# Patient Record
Sex: Female | Born: 1960 | Race: White | Hispanic: No | Marital: Single | Smoking: Former smoker
Health system: Southern US, Community
[De-identification: ages and names within clinical notes are randomized; demographics above are authoritative.]

## PROBLEM LIST (undated history)

## (undated) ENCOUNTER — Emergency Department (HOSPITAL_COMMUNITY): Admission: EM | Payer: Medicare Other | Source: Home / Self Care

## (undated) DIAGNOSIS — G709 Myoneural disorder, unspecified: Secondary | ICD-10-CM

## (undated) DIAGNOSIS — R7303 Prediabetes: Secondary | ICD-10-CM

## (undated) DIAGNOSIS — R011 Cardiac murmur, unspecified: Secondary | ICD-10-CM

## (undated) DIAGNOSIS — F319 Bipolar disorder, unspecified: Secondary | ICD-10-CM

## (undated) DIAGNOSIS — I341 Nonrheumatic mitral (valve) prolapse: Secondary | ICD-10-CM

## (undated) DIAGNOSIS — H269 Unspecified cataract: Secondary | ICD-10-CM

## (undated) DIAGNOSIS — F419 Anxiety disorder, unspecified: Secondary | ICD-10-CM

## (undated) DIAGNOSIS — T7840XA Allergy, unspecified, initial encounter: Secondary | ICD-10-CM

## (undated) DIAGNOSIS — K219 Gastro-esophageal reflux disease without esophagitis: Secondary | ICD-10-CM

## (undated) DIAGNOSIS — C50919 Malignant neoplasm of unspecified site of unspecified female breast: Secondary | ICD-10-CM

## (undated) DIAGNOSIS — F32A Depression, unspecified: Secondary | ICD-10-CM

## (undated) DIAGNOSIS — M1990 Unspecified osteoarthritis, unspecified site: Secondary | ICD-10-CM

## (undated) DIAGNOSIS — J45909 Unspecified asthma, uncomplicated: Secondary | ICD-10-CM

## (undated) DIAGNOSIS — E785 Hyperlipidemia, unspecified: Secondary | ICD-10-CM

## (undated) DIAGNOSIS — M797 Fibromyalgia: Secondary | ICD-10-CM

## (undated) DIAGNOSIS — I499 Cardiac arrhythmia, unspecified: Secondary | ICD-10-CM

## (undated) DIAGNOSIS — S62609A Fracture of unspecified phalanx of unspecified finger, initial encounter for closed fracture: Secondary | ICD-10-CM

## (undated) DIAGNOSIS — F329 Major depressive disorder, single episode, unspecified: Secondary | ICD-10-CM

## (undated) DIAGNOSIS — J449 Chronic obstructive pulmonary disease, unspecified: Secondary | ICD-10-CM

## (undated) DIAGNOSIS — Z9289 Personal history of other medical treatment: Secondary | ICD-10-CM

## (undated) DIAGNOSIS — H8109 Meniere's disease, unspecified ear: Secondary | ICD-10-CM

## (undated) HISTORY — DX: Allergy, unspecified, initial encounter: T78.40XA

## (undated) HISTORY — DX: Fracture of unspecified phalanx of unspecified finger, initial encounter for closed fracture: S62.609A

## (undated) HISTORY — PX: ELBOW SURGERY: SHX618

## (undated) HISTORY — DX: Chronic obstructive pulmonary disease, unspecified: J44.9

## (undated) HISTORY — PX: COLONOSCOPY: SHX174

## (undated) HISTORY — DX: Myoneural disorder, unspecified: G70.9

## (undated) HISTORY — DX: Bipolar disorder, unspecified: F31.9

## (undated) HISTORY — DX: Unspecified osteoarthritis, unspecified site: M19.90

## (undated) HISTORY — DX: Unspecified cataract: H26.9

## (undated) HISTORY — PX: POLYPECTOMY: SHX149

## (undated) HISTORY — PX: ECTOPIC PREGNANCY SURGERY: SHX613

## (undated) HISTORY — PX: EYE SURGERY: SHX253

## (undated) HISTORY — DX: Hyperlipidemia, unspecified: E78.5

## (undated) HISTORY — PX: WRIST SURGERY: SHX841

## (undated) HISTORY — PX: OTHER SURGICAL HISTORY: SHX169

## (undated) HISTORY — PX: TONSILLECTOMY: SUR1361

## (undated) HISTORY — DX: Malignant neoplasm of unspecified site of unspecified female breast: C50.919

## (undated) HISTORY — PX: ANKLE SURGERY: SHX546

## (undated) HISTORY — PX: TMJ ARTHROPLASTY: SHX1066

## (undated) HISTORY — DX: Cardiac murmur, unspecified: R01.1

## (undated) HISTORY — PX: UTERINE FIBROID SURGERY: SHX826

## (undated) HISTORY — DX: Anxiety disorder, unspecified: F41.9

## (undated) HISTORY — DX: Personal history of other medical treatment: Z92.89

## (undated) HISTORY — PX: KNEE SURGERY: SHX244

## (undated) HISTORY — DX: Nonrheumatic mitral (valve) prolapse: I34.1

---

## 1997-07-30 ENCOUNTER — Other Ambulatory Visit: Admission: RE | Admit: 1997-07-30 | Discharge: 1997-07-30 | Payer: Self-pay | Admitting: Obstetrics and Gynecology

## 1997-08-27 ENCOUNTER — Ambulatory Visit (HOSPITAL_COMMUNITY): Admission: RE | Admit: 1997-08-27 | Discharge: 1997-08-27 | Payer: Self-pay | Admitting: Obstetrics and Gynecology

## 1998-03-18 ENCOUNTER — Emergency Department (HOSPITAL_COMMUNITY): Admission: EM | Admit: 1998-03-18 | Discharge: 1998-03-18 | Payer: Self-pay | Admitting: Emergency Medicine

## 1998-08-22 ENCOUNTER — Encounter: Payer: Self-pay | Admitting: Obstetrics and Gynecology

## 1998-08-22 ENCOUNTER — Ambulatory Visit (HOSPITAL_COMMUNITY): Admission: RE | Admit: 1998-08-22 | Discharge: 1998-08-22 | Payer: Self-pay | Admitting: Internal Medicine

## 1999-03-15 ENCOUNTER — Inpatient Hospital Stay (HOSPITAL_COMMUNITY): Admission: EM | Admit: 1999-03-15 | Discharge: 1999-03-17 | Payer: Self-pay

## 1999-03-15 ENCOUNTER — Encounter: Payer: Self-pay | Admitting: *Deleted

## 1999-07-28 ENCOUNTER — Other Ambulatory Visit: Admission: RE | Admit: 1999-07-28 | Discharge: 1999-07-28 | Payer: Self-pay | Admitting: Obstetrics and Gynecology

## 1999-09-16 ENCOUNTER — Ambulatory Visit (HOSPITAL_BASED_OUTPATIENT_CLINIC_OR_DEPARTMENT_OTHER): Admission: RE | Admit: 1999-09-16 | Discharge: 1999-09-17 | Payer: Self-pay | Admitting: Orthopedic Surgery

## 2000-03-10 ENCOUNTER — Ambulatory Visit (HOSPITAL_BASED_OUTPATIENT_CLINIC_OR_DEPARTMENT_OTHER): Admission: RE | Admit: 2000-03-10 | Discharge: 2000-03-10 | Payer: Self-pay | Admitting: Orthopedic Surgery

## 2000-08-04 ENCOUNTER — Ambulatory Visit (HOSPITAL_BASED_OUTPATIENT_CLINIC_OR_DEPARTMENT_OTHER): Admission: RE | Admit: 2000-08-04 | Discharge: 2000-08-05 | Payer: Self-pay | Admitting: Orthopedic Surgery

## 2000-08-15 ENCOUNTER — Ambulatory Visit (HOSPITAL_COMMUNITY): Admission: RE | Admit: 2000-08-15 | Discharge: 2000-08-15 | Payer: Self-pay | Admitting: Obstetrics and Gynecology

## 2000-08-15 ENCOUNTER — Encounter: Payer: Self-pay | Admitting: Obstetrics and Gynecology

## 2000-12-12 ENCOUNTER — Encounter: Payer: Self-pay | Admitting: Internal Medicine

## 2000-12-12 ENCOUNTER — Encounter: Admission: RE | Admit: 2000-12-12 | Discharge: 2000-12-12 | Payer: Self-pay | Admitting: Internal Medicine

## 2001-08-08 ENCOUNTER — Encounter: Admission: RE | Admit: 2001-08-08 | Discharge: 2001-08-08 | Payer: Self-pay | Admitting: Obstetrics and Gynecology

## 2001-08-08 ENCOUNTER — Encounter: Payer: Self-pay | Admitting: Obstetrics and Gynecology

## 2003-08-19 ENCOUNTER — Other Ambulatory Visit: Admission: RE | Admit: 2003-08-19 | Discharge: 2003-08-19 | Payer: Self-pay | Admitting: Obstetrics and Gynecology

## 2003-09-06 ENCOUNTER — Ambulatory Visit (HOSPITAL_COMMUNITY): Admission: RE | Admit: 2003-09-06 | Discharge: 2003-09-06 | Payer: Self-pay | Admitting: Obstetrics and Gynecology

## 2003-09-12 ENCOUNTER — Ambulatory Visit (HOSPITAL_COMMUNITY): Admission: RE | Admit: 2003-09-12 | Discharge: 2003-09-12 | Payer: Self-pay | Admitting: Obstetrics and Gynecology

## 2004-04-11 ENCOUNTER — Emergency Department (HOSPITAL_COMMUNITY): Admission: EM | Admit: 2004-04-11 | Discharge: 2004-04-11 | Payer: Self-pay | Admitting: Emergency Medicine

## 2004-04-21 ENCOUNTER — Ambulatory Visit: Payer: Self-pay | Admitting: Internal Medicine

## 2004-04-24 ENCOUNTER — Emergency Department (HOSPITAL_COMMUNITY): Admission: EM | Admit: 2004-04-24 | Discharge: 2004-04-24 | Payer: Self-pay | Admitting: Emergency Medicine

## 2004-04-27 ENCOUNTER — Ambulatory Visit: Payer: Self-pay | Admitting: *Deleted

## 2004-04-29 ENCOUNTER — Ambulatory Visit: Payer: Self-pay | Admitting: Family Medicine

## 2004-04-29 ENCOUNTER — Ambulatory Visit (HOSPITAL_COMMUNITY): Admission: RE | Admit: 2004-04-29 | Discharge: 2004-04-29 | Payer: Self-pay | Admitting: Family Medicine

## 2004-06-19 ENCOUNTER — Ambulatory Visit: Payer: Self-pay | Admitting: Internal Medicine

## 2004-06-21 ENCOUNTER — Emergency Department (HOSPITAL_COMMUNITY): Admission: EM | Admit: 2004-06-21 | Discharge: 2004-06-21 | Payer: Self-pay | Admitting: Emergency Medicine

## 2004-07-01 ENCOUNTER — Ambulatory Visit (HOSPITAL_COMMUNITY): Admission: RE | Admit: 2004-07-01 | Discharge: 2004-07-01 | Payer: Self-pay | Admitting: Internal Medicine

## 2004-07-01 ENCOUNTER — Ambulatory Visit: Payer: Self-pay | Admitting: Internal Medicine

## 2004-09-19 ENCOUNTER — Emergency Department (HOSPITAL_COMMUNITY): Admission: EM | Admit: 2004-09-19 | Discharge: 2004-09-20 | Payer: Self-pay | Admitting: Emergency Medicine

## 2004-12-31 ENCOUNTER — Ambulatory Visit: Payer: Self-pay | Admitting: Internal Medicine

## 2005-03-01 ENCOUNTER — Ambulatory Visit (HOSPITAL_COMMUNITY): Admission: RE | Admit: 2005-03-01 | Discharge: 2005-03-01 | Payer: Self-pay | Admitting: Internal Medicine

## 2005-03-08 ENCOUNTER — Ambulatory Visit: Payer: Self-pay | Admitting: Internal Medicine

## 2005-03-08 ENCOUNTER — Encounter: Payer: Self-pay | Admitting: Internal Medicine

## 2005-04-23 ENCOUNTER — Ambulatory Visit: Payer: Self-pay | Admitting: Internal Medicine

## 2005-06-15 ENCOUNTER — Ambulatory Visit: Payer: Self-pay | Admitting: Internal Medicine

## 2005-06-29 ENCOUNTER — Ambulatory Visit: Payer: Self-pay | Admitting: Internal Medicine

## 2005-07-13 ENCOUNTER — Ambulatory Visit: Payer: Self-pay | Admitting: Internal Medicine

## 2005-07-27 ENCOUNTER — Ambulatory Visit: Payer: Self-pay | Admitting: Internal Medicine

## 2005-10-26 ENCOUNTER — Ambulatory Visit: Payer: Self-pay | Admitting: Internal Medicine

## 2005-12-07 ENCOUNTER — Ambulatory Visit: Payer: Self-pay | Admitting: Internal Medicine

## 2006-01-13 ENCOUNTER — Ambulatory Visit: Payer: Self-pay | Admitting: Internal Medicine

## 2006-01-27 ENCOUNTER — Emergency Department (HOSPITAL_COMMUNITY): Admission: EM | Admit: 2006-01-27 | Discharge: 2006-01-27 | Payer: Self-pay | Admitting: Emergency Medicine

## 2006-01-27 ENCOUNTER — Emergency Department (HOSPITAL_COMMUNITY): Admission: EM | Admit: 2006-01-27 | Discharge: 2006-01-27 | Payer: Self-pay | Admitting: Family Medicine

## 2006-01-30 ENCOUNTER — Emergency Department (HOSPITAL_COMMUNITY): Admission: EM | Admit: 2006-01-30 | Discharge: 2006-01-30 | Payer: Self-pay | Admitting: Emergency Medicine

## 2006-01-30 ENCOUNTER — Emergency Department (HOSPITAL_COMMUNITY): Admission: EM | Admit: 2006-01-30 | Discharge: 2006-01-30 | Payer: Self-pay | Admitting: Internal Medicine

## 2006-02-04 ENCOUNTER — Ambulatory Visit: Payer: Self-pay | Admitting: Internal Medicine

## 2006-03-14 ENCOUNTER — Emergency Department (HOSPITAL_COMMUNITY): Admission: EM | Admit: 2006-03-14 | Discharge: 2006-03-14 | Payer: Self-pay | Admitting: Emergency Medicine

## 2006-04-05 ENCOUNTER — Ambulatory Visit: Payer: Self-pay | Admitting: Internal Medicine

## 2006-04-26 ENCOUNTER — Ambulatory Visit (HOSPITAL_COMMUNITY): Admission: RE | Admit: 2006-04-26 | Discharge: 2006-04-26 | Payer: Self-pay | Admitting: Family Medicine

## 2006-07-22 ENCOUNTER — Ambulatory Visit: Payer: Self-pay | Admitting: Internal Medicine

## 2006-09-02 DIAGNOSIS — F329 Major depressive disorder, single episode, unspecified: Secondary | ICD-10-CM | POA: Insufficient documentation

## 2006-09-02 DIAGNOSIS — I10 Essential (primary) hypertension: Secondary | ICD-10-CM | POA: Insufficient documentation

## 2006-09-02 DIAGNOSIS — K219 Gastro-esophageal reflux disease without esophagitis: Secondary | ICD-10-CM | POA: Insufficient documentation

## 2006-09-02 DIAGNOSIS — E785 Hyperlipidemia, unspecified: Secondary | ICD-10-CM | POA: Insufficient documentation

## 2006-09-02 DIAGNOSIS — J449 Chronic obstructive pulmonary disease, unspecified: Secondary | ICD-10-CM | POA: Insufficient documentation

## 2006-09-02 DIAGNOSIS — F32A Depression, unspecified: Secondary | ICD-10-CM | POA: Insufficient documentation

## 2006-09-07 ENCOUNTER — Emergency Department (HOSPITAL_COMMUNITY): Admission: EM | Admit: 2006-09-07 | Discharge: 2006-09-07 | Payer: Self-pay | Admitting: Emergency Medicine

## 2006-10-12 ENCOUNTER — Encounter (INDEPENDENT_AMBULATORY_CARE_PROVIDER_SITE_OTHER): Payer: Self-pay | Admitting: *Deleted

## 2006-10-28 ENCOUNTER — Encounter: Payer: Self-pay | Admitting: Internal Medicine

## 2006-10-28 ENCOUNTER — Ambulatory Visit: Payer: Self-pay | Admitting: Internal Medicine

## 2006-10-28 LAB — CONVERTED CEMR LAB
Calcium: 10 mg/dL (ref 8.4–10.5)
Chlamydia, DNA Probe: NEGATIVE
Creatinine, Ser: 0.89 mg/dL (ref 0.40–1.20)
Glucose, Bld: 48 mg/dL — ABNORMAL LOW (ref 70–99)
Sodium: 144 meq/L (ref 135–145)

## 2006-11-11 ENCOUNTER — Emergency Department (HOSPITAL_COMMUNITY): Admission: EM | Admit: 2006-11-11 | Discharge: 2006-11-11 | Payer: Self-pay | Admitting: Emergency Medicine

## 2006-11-28 ENCOUNTER — Ambulatory Visit: Payer: Self-pay | Admitting: Internal Medicine

## 2006-12-09 ENCOUNTER — Ambulatory Visit: Payer: Self-pay | Admitting: Internal Medicine

## 2007-01-12 ENCOUNTER — Ambulatory Visit: Payer: Self-pay | Admitting: Internal Medicine

## 2007-02-22 ENCOUNTER — Ambulatory Visit: Payer: Self-pay | Admitting: Internal Medicine

## 2007-03-30 ENCOUNTER — Ambulatory Visit: Payer: Self-pay | Admitting: Internal Medicine

## 2007-04-04 ENCOUNTER — Emergency Department (HOSPITAL_COMMUNITY): Admission: EM | Admit: 2007-04-04 | Discharge: 2007-04-04 | Payer: Self-pay | Admitting: Family Medicine

## 2007-04-07 ENCOUNTER — Inpatient Hospital Stay (HOSPITAL_COMMUNITY): Admission: EM | Admit: 2007-04-07 | Discharge: 2007-04-10 | Payer: Self-pay | Admitting: Emergency Medicine

## 2007-04-18 ENCOUNTER — Ambulatory Visit: Payer: Self-pay | Admitting: Internal Medicine

## 2007-04-25 ENCOUNTER — Emergency Department (HOSPITAL_COMMUNITY): Admission: EM | Admit: 2007-04-25 | Discharge: 2007-04-26 | Payer: Self-pay | Admitting: Emergency Medicine

## 2007-04-27 ENCOUNTER — Ambulatory Visit: Payer: Self-pay | Admitting: Internal Medicine

## 2007-05-10 ENCOUNTER — Ambulatory Visit: Payer: Self-pay | Admitting: Internal Medicine

## 2007-06-08 ENCOUNTER — Emergency Department (HOSPITAL_COMMUNITY): Admission: EM | Admit: 2007-06-08 | Discharge: 2007-06-08 | Payer: Self-pay | Admitting: Family Medicine

## 2007-06-09 ENCOUNTER — Ambulatory Visit: Payer: Self-pay | Admitting: Internal Medicine

## 2007-08-25 ENCOUNTER — Encounter: Admission: RE | Admit: 2007-08-25 | Discharge: 2007-08-25 | Payer: Self-pay | Admitting: Otolaryngology

## 2007-10-16 ENCOUNTER — Encounter: Admission: RE | Admit: 2007-10-16 | Discharge: 2007-11-09 | Payer: Self-pay | Admitting: *Deleted

## 2007-11-18 ENCOUNTER — Emergency Department (HOSPITAL_COMMUNITY): Admission: EM | Admit: 2007-11-18 | Discharge: 2007-11-18 | Payer: Self-pay | Admitting: Family Medicine

## 2007-12-28 ENCOUNTER — Encounter: Admission: RE | Admit: 2007-12-28 | Discharge: 2007-12-28 | Payer: Self-pay | Admitting: Orthopedic Surgery

## 2008-03-01 ENCOUNTER — Encounter: Admission: RE | Admit: 2008-03-01 | Discharge: 2008-03-01 | Payer: Self-pay | Admitting: Orthopedic Surgery

## 2008-03-13 ENCOUNTER — Ambulatory Visit (HOSPITAL_BASED_OUTPATIENT_CLINIC_OR_DEPARTMENT_OTHER): Admission: RE | Admit: 2008-03-13 | Discharge: 2008-03-13 | Payer: Self-pay | Admitting: Orthopedic Surgery

## 2008-04-11 ENCOUNTER — Other Ambulatory Visit: Admission: RE | Admit: 2008-04-11 | Discharge: 2008-04-11 | Payer: Self-pay | Admitting: Internal Medicine

## 2008-04-22 ENCOUNTER — Encounter: Admission: RE | Admit: 2008-04-22 | Discharge: 2008-04-29 | Payer: Self-pay | Admitting: Orthopedic Surgery

## 2008-04-22 ENCOUNTER — Encounter: Admission: RE | Admit: 2008-04-22 | Discharge: 2008-04-29 | Payer: Self-pay | Admitting: Sports Medicine

## 2008-05-16 ENCOUNTER — Other Ambulatory Visit: Admission: RE | Admit: 2008-05-16 | Discharge: 2008-05-16 | Payer: Self-pay | Admitting: Obstetrics and Gynecology

## 2008-06-10 ENCOUNTER — Emergency Department (HOSPITAL_COMMUNITY): Admission: EM | Admit: 2008-06-10 | Discharge: 2008-06-11 | Payer: Self-pay | Admitting: Emergency Medicine

## 2008-09-19 ENCOUNTER — Ambulatory Visit (HOSPITAL_BASED_OUTPATIENT_CLINIC_OR_DEPARTMENT_OTHER): Admission: RE | Admit: 2008-09-19 | Discharge: 2008-09-19 | Payer: Self-pay | Admitting: Orthopedic Surgery

## 2009-01-25 DIAGNOSIS — S62609A Fracture of unspecified phalanx of unspecified finger, initial encounter for closed fracture: Secondary | ICD-10-CM

## 2009-01-25 HISTORY — PX: BREAST LUMPECTOMY: SHX2

## 2009-01-25 HISTORY — PX: JOINT REPLACEMENT: SHX530

## 2009-01-25 HISTORY — DX: Fracture of unspecified phalanx of unspecified finger, initial encounter for closed fracture: S62.609A

## 2009-01-25 HISTORY — PX: BREAST SURGERY: SHX581

## 2009-02-23 ENCOUNTER — Emergency Department (HOSPITAL_COMMUNITY): Admission: EM | Admit: 2009-02-23 | Discharge: 2009-02-23 | Payer: Self-pay | Admitting: Emergency Medicine

## 2009-03-05 ENCOUNTER — Ambulatory Visit (HOSPITAL_COMMUNITY): Admission: RE | Admit: 2009-03-05 | Discharge: 2009-03-05 | Payer: Self-pay | Admitting: Obstetrics and Gynecology

## 2009-04-22 ENCOUNTER — Ambulatory Visit (HOSPITAL_COMMUNITY): Admission: RE | Admit: 2009-04-22 | Discharge: 2009-04-22 | Payer: Self-pay | Admitting: Obstetrics and Gynecology

## 2009-04-28 ENCOUNTER — Encounter: Admission: RE | Admit: 2009-04-28 | Discharge: 2009-04-28 | Payer: Self-pay | Admitting: Obstetrics and Gynecology

## 2009-05-06 ENCOUNTER — Encounter: Admission: RE | Admit: 2009-05-06 | Discharge: 2009-05-06 | Payer: Self-pay | Admitting: Obstetrics and Gynecology

## 2009-05-13 ENCOUNTER — Encounter: Admission: RE | Admit: 2009-05-13 | Discharge: 2009-05-13 | Payer: Self-pay | Admitting: Obstetrics and Gynecology

## 2009-06-02 ENCOUNTER — Encounter: Admission: RE | Admit: 2009-06-02 | Discharge: 2009-06-02 | Payer: Self-pay | Admitting: General Surgery

## 2009-06-02 ENCOUNTER — Ambulatory Visit (HOSPITAL_BASED_OUTPATIENT_CLINIC_OR_DEPARTMENT_OTHER): Admission: RE | Admit: 2009-06-02 | Discharge: 2009-06-02 | Payer: Self-pay | Admitting: General Surgery

## 2009-06-12 ENCOUNTER — Ambulatory Visit: Payer: Self-pay | Admitting: Oncology

## 2009-06-25 ENCOUNTER — Ambulatory Visit: Admission: RE | Admit: 2009-06-25 | Discharge: 2009-08-19 | Payer: Self-pay | Admitting: Radiation Oncology

## 2009-06-27 ENCOUNTER — Encounter: Admission: RE | Admit: 2009-06-27 | Discharge: 2009-06-27 | Payer: Self-pay | Admitting: Oncology

## 2009-09-07 ENCOUNTER — Encounter: Admission: RE | Admit: 2009-09-07 | Discharge: 2009-09-07 | Payer: Self-pay | Admitting: Orthopedic Surgery

## 2009-09-17 ENCOUNTER — Ambulatory Visit: Payer: Self-pay | Admitting: Oncology

## 2009-09-19 LAB — CBC WITH DIFFERENTIAL/PLATELET
BASO%: 0.3 % (ref 0.0–2.0)
EOS%: 1.5 % (ref 0.0–7.0)
Eosinophils Absolute: 0.1 10*3/uL (ref 0.0–0.5)
LYMPH%: 25.6 % (ref 14.0–49.7)
MCHC: 34.8 g/dL (ref 31.5–36.0)
MCV: 93.3 fL (ref 79.5–101.0)
MONO%: 5.8 % (ref 0.0–14.0)
NEUT#: 4.8 10*3/uL (ref 1.5–6.5)
RBC: 4.62 10*6/uL (ref 3.70–5.45)
RDW: 14.1 % (ref 11.2–14.5)

## 2009-09-19 LAB — COMPREHENSIVE METABOLIC PANEL
ALT: 15 U/L (ref 0–35)
AST: 17 U/L (ref 0–37)
Albumin: 4.4 g/dL (ref 3.5–5.2)
Alkaline Phosphatase: 75 U/L (ref 39–117)
Potassium: 4 mEq/L (ref 3.5–5.3)
Sodium: 140 mEq/L (ref 135–145)
Total Bilirubin: 0.4 mg/dL (ref 0.3–1.2)
Total Protein: 6.9 g/dL (ref 6.0–8.3)

## 2009-12-22 ENCOUNTER — Other Ambulatory Visit: Admission: RE | Admit: 2009-12-22 | Discharge: 2009-12-22 | Payer: Self-pay | Admitting: Obstetrics and Gynecology

## 2009-12-24 ENCOUNTER — Ambulatory Visit: Payer: Self-pay | Admitting: Oncology

## 2009-12-24 ENCOUNTER — Encounter
Admission: RE | Admit: 2009-12-24 | Discharge: 2010-01-22 | Payer: Self-pay | Source: Home / Self Care | Attending: Orthopedic Surgery | Admitting: Orthopedic Surgery

## 2009-12-31 LAB — CBC WITH DIFFERENTIAL/PLATELET
Basophils Absolute: 0 10*3/uL (ref 0.0–0.1)
EOS%: 0.7 % (ref 0.0–7.0)
Eosinophils Absolute: 0 10*3/uL (ref 0.0–0.5)
HCT: 36.7 % (ref 34.8–46.6)
HGB: 12.9 g/dL (ref 11.6–15.9)
MCH: 33 pg (ref 25.1–34.0)
MCV: 94.1 fL (ref 79.5–101.0)
MONO%: 6.2 % (ref 0.0–14.0)
NEUT#: 3.7 10*3/uL (ref 1.5–6.5)
NEUT%: 69.8 % (ref 38.4–76.8)
RDW: 13.5 % (ref 11.2–14.5)

## 2010-01-01 LAB — COMPREHENSIVE METABOLIC PANEL
AST: 18 U/L (ref 0–37)
Albumin: 4.6 g/dL (ref 3.5–5.2)
Alkaline Phosphatase: 49 U/L (ref 39–117)
BUN: 15 mg/dL (ref 6–23)
Calcium: 8.8 mg/dL (ref 8.4–10.5)
Chloride: 103 mEq/L (ref 96–112)
Creatinine, Ser: 1.04 mg/dL (ref 0.40–1.20)
Glucose, Bld: 93 mg/dL (ref 70–99)
Potassium: 3.9 mEq/L (ref 3.5–5.3)

## 2010-01-01 LAB — VITAMIN D 25 HYDROXY (VIT D DEFICIENCY, FRACTURES): Vit D, 25-Hydroxy: 38 ng/mL (ref 30–89)

## 2010-01-09 ENCOUNTER — Encounter
Admission: RE | Admit: 2010-01-09 | Discharge: 2010-01-09 | Payer: Self-pay | Source: Home / Self Care | Attending: Oncology | Admitting: Oncology

## 2010-02-03 ENCOUNTER — Encounter
Admission: RE | Admit: 2010-02-03 | Discharge: 2010-02-03 | Payer: Self-pay | Source: Home / Self Care | Attending: Internal Medicine | Admitting: Internal Medicine

## 2010-02-15 ENCOUNTER — Encounter: Payer: Self-pay | Admitting: *Deleted

## 2010-02-16 ENCOUNTER — Encounter: Payer: Self-pay | Admitting: Otolaryngology

## 2010-02-16 LAB — URINALYSIS, ROUTINE W REFLEX MICROSCOPIC
Leukocytes, UA: NEGATIVE
Protein, ur: NEGATIVE mg/dL
Urobilinogen, UA: 0.2 mg/dL (ref 0.0–1.0)

## 2010-02-16 LAB — PROTIME-INR
INR: 0.87 (ref 0.00–1.49)
Prothrombin Time: 12 seconds (ref 11.6–15.2)

## 2010-02-16 LAB — COMPREHENSIVE METABOLIC PANEL
AST: 25 U/L (ref 0–37)
CO2: 29 mEq/L (ref 19–32)
Chloride: 103 mEq/L (ref 96–112)
Creatinine, Ser: 0.94 mg/dL (ref 0.4–1.2)
GFR calc Af Amer: >60 mL/min (ref >60)
GFR calc non Af Amer: >60 mL/min (ref >60)
Total Bilirubin: 0.4 mg/dL (ref 0.3–1.2)

## 2010-02-16 LAB — URINE MICROSCOPIC-ADD ON

## 2010-02-16 LAB — TYPE AND SCREEN

## 2010-02-16 LAB — CBC
Hemoglobin: 13.8 g/dL (ref 12.0–15.0)
RBC: 4.31 MIL/uL (ref 3.87–5.11)
WBC: 6.2 10*3/uL (ref 4.0–10.5)

## 2010-02-16 LAB — APTT: aPTT: 27 seconds (ref 24–37)

## 2010-02-17 LAB — URINE CULTURE
Colony Count: 70000
Culture  Setup Time: 201201202018

## 2010-02-18 ENCOUNTER — Inpatient Hospital Stay (HOSPITAL_COMMUNITY)
Admission: RE | Admit: 2010-02-18 | Discharge: 2010-02-20 | Payer: Self-pay | Source: Home / Self Care | Attending: Orthopedic Surgery | Admitting: Orthopedic Surgery

## 2010-02-19 LAB — CBC
MCH: 31.9 pg (ref 26.0–34.0)
MCHC: 33.9 g/dL (ref 30.0–36.0)
Platelets: 140 10*3/uL — ABNORMAL LOW (ref 150–400)
RDW: 13.2 % (ref 11.5–15.5)

## 2010-02-19 LAB — BASIC METABOLIC PANEL
Calcium: 8.4 mg/dL (ref 8.4–10.5)
Creatinine, Ser: 0.79 mg/dL (ref 0.4–1.2)
GFR calc Af Amer: >60 mL/min (ref >60)

## 2010-02-19 LAB — PROTIME-INR
INR: 1.02 (ref 0.00–1.49)
Prothrombin Time: 13.6 seconds (ref 11.6–15.2)

## 2010-02-20 LAB — BASIC METABOLIC PANEL
BUN: 5 mg/dL — ABNORMAL LOW (ref 6–23)
Chloride: 100 mEq/L (ref 96–112)
Creatinine, Ser: 0.8 mg/dL (ref 0.4–1.2)
GFR calc non Af Amer: >60 mL/min (ref >60)
Glucose, Bld: 110 mg/dL — ABNORMAL HIGH (ref 70–99)

## 2010-02-20 LAB — CBC
HCT: 27 % — ABNORMAL LOW (ref 36.0–46.0)
MCH: 31.3 pg (ref 26.0–34.0)
MCV: 93.8 fL (ref 78.0–100.0)
Platelets: 146 10*3/uL — ABNORMAL LOW (ref 150–400)
RDW: 13 % (ref 11.5–15.5)

## 2010-02-25 NOTE — Op Note (Signed)
Anita Arroyo, Anita Arroyo                   ACCOUNT NO.:  [id number]  MEDICAL RECORD NO.:  [id number]          PATIENT TYPE:  INP  LOCATION:  5036                         FACILITY:  [facility]  PHYSICIAN:  Loreta Ave, M.D. DATE OF BIRTH:  October 20, 1960  DATE OF PROCEDURE:  02/18/2010 DATE OF DISCHARGE:                              OPERATIVE REPORT   PREOPERATIVE DIAGNOSES:  Post-traumatic end-stage degenerative arthritis, right knee; varus alignment; mild flexion contracture.  POSTOPERATIVE DIAGNOSIS:  Post-traumatic end-stage degenerative arthritis, right knee; varus alignment; mild flexion contracture.  PROCEDURE:  Right knee modified minimally invasive total knee replacement with Stryker triathlon prosthesis.  Soft tissue balancing. Cemented pegged posterior stabilized #4 femoral component.  Cemented #4 tibial component, 9-mm polyethylene insert.  Cemented resurfacing 32-mm patellar component.  SURGEON:  Loreta Ave, MD  ASSISTANT:  Genene Churn. Barry Dienes, PA present throughout the entire case necessary for timely completion of procedure.  ANESTHESIA:  General  BLOOD LOSS:  Minimal.  SPECIMENS:  None.  CULTURES:  None.  COMPLICATIONS:  None.  DRESSINGS:  Soft compressive, knee immobilizer.  DRAIN:  Hemovac x1.  TOURNIQUET TIME:  45 minutes.  PROCEDURE IN DETAIL:  The patient was brought to the operating room, placed on the operating table in supine position.  After adequate anesthesia had been obtained, tourniquet applied.  Prepped and draped in usual sterile fashion.  Exsanguinated with elevation of Esmarch. Tourniquet inflated to 350 mmHg.  Knee examined.  Mild flexion contracture, varus alignment correctable to least neutral.  The anterior incision above the patella down to tibial tubercle.  Medial arthrotomy. Medial capsule release.  Knee exposed.  Grade 4 changes medially with a little bit lesser extent changes in the other compartments. Periarticular spurs, remnants  of menisci, cruciate ligaments removed. Distal femur exposed.  Intramedullary guide placed.  Distal cut 5 degrees of valgus resecting 10 mm.  Using epicondylar axis, the femur was size cut and fitted for a posterior stabilized #4 pegged femoral component.  Attention turned to the tibia.  Extramedullary guide.  3- degree posterior slope cut below the medial defect.  Sized to #4 component.  All recess examined to be sure all spurs, loose bodies, remnants of menisci removed.  Back of the femur also palpated to be sure all spurs removed.  Trials put in place.  #4 above and below with a 9-mm insert.  Tibia was marked for rotation with trials.  With this components, full extension, full flexion, good alignment, and good stability.  Patella exposed.  Posterior 10 mm removed.  Drilled, sized, and fitted for a 32-mm component with excellent tracking confirmed.  All trials removed.  Copious irrigation with a pulse irrigating device. Cement prepared and placed on all components.  All components were firmly seated.  The patella held with a clamp.  Once the cement had hardened and the polyethylene attached to the tibia, the knee was reexamined.  Full extension, full flexion, good alignment, good stability, good tracking confirmed.  Hemovac placed through a separate stab wound.  Arthrotomy closed with #1 Vicryl, skin and subcutaneous tissue with Vicryl and staples.  Sterile compressive  dressing applied. Tourniquet deflated and removed.  Knee immobilizer applied.  Anesthesia reversed.  Brought to recovery room.  Tolerated the surgery well.  No complications.     Loreta Ave, M.D.     DFM/MEDQ  D:  02/18/2010  T:  02/19/2010  Job:  [id number]  Electronically Signed by Mckinley Jewel M.D. on 02/25/2010 10:37:24 AM

## 2010-03-16 ENCOUNTER — Ambulatory Visit: Payer: Medicare Other | Attending: Orthopedic Surgery

## 2010-03-16 DIAGNOSIS — M6281 Muscle weakness (generalized): Secondary | ICD-10-CM | POA: Insufficient documentation

## 2010-03-16 DIAGNOSIS — Z96659 Presence of unspecified artificial knee joint: Secondary | ICD-10-CM | POA: Insufficient documentation

## 2010-03-16 DIAGNOSIS — M25669 Stiffness of unspecified knee, not elsewhere classified: Secondary | ICD-10-CM | POA: Insufficient documentation

## 2010-03-16 DIAGNOSIS — R262 Difficulty in walking, not elsewhere classified: Secondary | ICD-10-CM | POA: Insufficient documentation

## 2010-03-16 DIAGNOSIS — IMO0001 Reserved for inherently not codable concepts without codable children: Secondary | ICD-10-CM | POA: Insufficient documentation

## 2010-03-16 DIAGNOSIS — R269 Unspecified abnormalities of gait and mobility: Secondary | ICD-10-CM | POA: Insufficient documentation

## 2010-03-18 ENCOUNTER — Ambulatory Visit: Payer: Medicare Other | Admitting: Physical Therapy

## 2010-03-20 ENCOUNTER — Ambulatory Visit: Payer: Medicare Other | Admitting: Physical Therapy

## 2010-03-23 ENCOUNTER — Ambulatory Visit: Payer: Medicare Other

## 2010-03-25 ENCOUNTER — Ambulatory Visit: Payer: Medicare Other

## 2010-03-27 ENCOUNTER — Ambulatory Visit: Payer: Medicare Other | Attending: Orthopedic Surgery | Admitting: Physical Therapy

## 2010-03-27 DIAGNOSIS — Z96659 Presence of unspecified artificial knee joint: Secondary | ICD-10-CM | POA: Insufficient documentation

## 2010-03-27 DIAGNOSIS — R269 Unspecified abnormalities of gait and mobility: Secondary | ICD-10-CM | POA: Insufficient documentation

## 2010-03-27 DIAGNOSIS — IMO0001 Reserved for inherently not codable concepts without codable children: Secondary | ICD-10-CM | POA: Insufficient documentation

## 2010-03-27 DIAGNOSIS — M6281 Muscle weakness (generalized): Secondary | ICD-10-CM | POA: Insufficient documentation

## 2010-03-27 DIAGNOSIS — R262 Difficulty in walking, not elsewhere classified: Secondary | ICD-10-CM | POA: Insufficient documentation

## 2010-03-27 DIAGNOSIS — M25669 Stiffness of unspecified knee, not elsewhere classified: Secondary | ICD-10-CM | POA: Insufficient documentation

## 2010-03-30 ENCOUNTER — Ambulatory Visit: Payer: Medicare Other | Admitting: Physical Therapy

## 2010-04-01 ENCOUNTER — Ambulatory Visit: Payer: Medicare Other | Admitting: Physical Therapy

## 2010-04-03 ENCOUNTER — Ambulatory Visit: Payer: Medicare Other

## 2010-04-06 ENCOUNTER — Other Ambulatory Visit: Payer: Medicare Other

## 2010-04-06 ENCOUNTER — Ambulatory Visit: Payer: Medicare Other | Admitting: Physical Therapy

## 2010-04-08 ENCOUNTER — Ambulatory Visit: Payer: Medicare Other | Admitting: Physical Therapy

## 2010-04-10 ENCOUNTER — Ambulatory Visit: Payer: Medicare Other | Admitting: Physical Therapy

## 2010-04-13 ENCOUNTER — Ambulatory Visit: Payer: Medicare Other | Admitting: Physical Therapy

## 2010-04-13 ENCOUNTER — Ambulatory Visit (INDEPENDENT_AMBULATORY_CARE_PROVIDER_SITE_OTHER): Payer: Medicare Other

## 2010-04-13 ENCOUNTER — Encounter: Payer: Self-pay | Admitting: Infectious Disease

## 2010-04-13 DIAGNOSIS — L538 Other specified erythematous conditions: Secondary | ICD-10-CM | POA: Insufficient documentation

## 2010-04-13 DIAGNOSIS — Z8619 Personal history of other infectious and parasitic diseases: Secondary | ICD-10-CM

## 2010-04-13 DIAGNOSIS — A4902 Methicillin resistant Staphylococcus aureus infection, unspecified site: Secondary | ICD-10-CM

## 2010-04-14 LAB — DIFFERENTIAL
Eosinophils Absolute: 0.1 10*3/uL (ref 0.0–0.7)
Eosinophils Relative: 1 % (ref 0–5)
Lymphs Abs: 1.6 10*3/uL (ref 0.7–4.0)
Monocytes Relative: 8 % (ref 3–12)

## 2010-04-14 LAB — COMPREHENSIVE METABOLIC PANEL
ALT: 24 U/L (ref 0–35)
AST: 30 U/L (ref 0–37)
Albumin: 3.8 g/dL (ref 3.5–5.2)
CO2: 30 mEq/L (ref 19–32)
Calcium: 9.6 mg/dL (ref 8.4–10.5)
GFR calc Af Amer: >60 mL/min (ref >60)
Sodium: 143 mEq/L (ref 135–145)
Total Protein: 6.9 g/dL (ref 6.0–8.3)

## 2010-04-14 LAB — CBC
MCHC: 34.8 g/dL (ref 30.0–36.0)
RBC: 4.34 MIL/uL (ref 3.87–5.11)
RDW: 13.7 % (ref 11.5–15.5)

## 2010-04-15 ENCOUNTER — Ambulatory Visit: Payer: Medicare Other | Admitting: Physical Therapy

## 2010-04-16 LAB — DIFFERENTIAL
Basophils Absolute: 0 10*3/uL (ref 0.0–0.1)
Basophils Relative: 1 % (ref 0–1)
Eosinophils Absolute: 0 10*3/uL (ref 0.0–0.7)
Monocytes Absolute: 0.6 10*3/uL (ref 0.1–1.0)
Neutro Abs: 3.6 10*3/uL (ref 1.7–7.7)
Neutrophils Relative %: 62 % (ref 43–77)

## 2010-04-16 LAB — BASIC METABOLIC PANEL
CO2: 27 mEq/L (ref 19–32)
Calcium: 8.9 mg/dL (ref 8.4–10.5)
Creatinine, Ser: 0.78 mg/dL (ref 0.4–1.2)
Glucose, Bld: 103 mg/dL — ABNORMAL HIGH (ref 70–99)

## 2010-04-16 LAB — CBC
Hemoglobin: 14.4 g/dL (ref 12.0–15.0)
MCHC: 33.2 g/dL (ref 30.0–36.0)
MCV: 94.1 fL (ref 78.0–100.0)
RDW: 14.5 % (ref 11.5–15.5)

## 2010-04-17 ENCOUNTER — Ambulatory Visit: Payer: Medicare Other | Admitting: Physical Therapy

## 2010-04-20 ENCOUNTER — Ambulatory Visit: Payer: Medicare Other | Admitting: Physical Therapy

## 2010-04-22 ENCOUNTER — Ambulatory Visit: Payer: Medicare Other | Admitting: Physical Therapy

## 2010-04-22 ENCOUNTER — Telehealth: Payer: Self-pay | Admitting: Infectious Disease

## 2010-04-22 DIAGNOSIS — L0390 Cellulitis, unspecified: Secondary | ICD-10-CM

## 2010-04-22 MED ORDER — DOXYCYCLINE HYCLATE 100 MG PO CAPS: 100.0000 mg | ORAL_CAPSULE | Freq: Two times a day (BID) | ORAL | Status: AC

## 2010-04-22 NOTE — Telephone Encounter (Signed)
I dont see anyone on the schedule for this Friday.

## 2010-04-22 NOTE — Telephone Encounter (Signed)
Patient is IRATE and says that our staff both in clnic and in office have problems. She claims to have been trying to make appt since a week ago. Her buttocks rash is worse. I am calling in doxycyline for now and have her stop her fluconazole. I will see about possibility of working her in this Friday. How many people am I seeing this Friday already on urgent work in basis?

## 2010-04-23 NOTE — Telephone Encounter (Signed)
Spoke with this patient and she has another doctors appointment tomorrow at 11:00am, she would like to know if there was another time she could come tomorrow? She said she should be finished around 12:00 noon tomorrow.

## 2010-04-23 NOTE — Telephone Encounter (Signed)
NOPE, that is it. We can see how she does on the doxycycline that I called in for her. She has a PCP and a dermatologist who referred her to [place] so they can also help monitor her. If needed I can see about working her in on urgent basis as I have before. I am NOT working her in in the afternoon

## 2010-04-23 NOTE — Assessment & Plan Note (Signed)
Summary: Consult for rash   Vital Signs:  Patient profile:   50 year old female Height:      64 inches (162.56 cm) Weight:      178.50 pounds (81.14 kg) BMI:     30.75 Temp:     99.6 degrees F (37.56 degrees C) oral Pulse rate:   75 / minute BP sitting:   125 / 76  (left arm)  Vitals Entered By: Starleen Arms CMA (April 13, 2010 12:07 PM)  Physical Exam  General:  alert, well-hydrated, and overweight-appearing.   Head:  normocephalic, atraumatic, no abnormalities observed, and no abnormalities palpated.   Eyes:  vision grossly intact, pupils equal, and pupils round.   Ears:  no external deformities.   Nose:  no external deformity.   Mouth:  no erythema and no exudates.   Neck:  supple and full ROM.   Lungs:  normal respiratory effort and no wheezes.   Heart:  normal rate, regular rhythm, and no murmur.   Abdomen:  soft, no distention, and no masses.   Msk:  normal ROM.   Extremities:  trace left pedal edema and trace right pedal edema.   Neurologic:  alert & oriented X3, strength normal in all extremities, and gait normal.   Skin:  she has intense erythema along the gluteal fold  Her tka site is cdi Psych:  Oriented X3 and memory intact for recent and remote.  anxious   Impression & Recommendations:  Problem # 1:  INTERTRIGO (ZOX-096.04)  To me this looks very much like candidal intertrigo. I will give her fluconazole 200mg  for 2 wks. I would prefer that she hold off on any further topical meds for now. In particular I would like her to avoid the antibacterial and if possible the antifungal and would like to try to keep this area dry. Perhaps a topical antifungal powder might be reasonable.  Orders: Consultation Level IV (54098)  Problem # 2:  MRSA (ICD-041.12)  no evidence of active infection at this time and dont think the intertrigo is due to this  Orders: Consultation Level IV (11914)  Problem # 3:  GROUP B STREPTOCOCCUS INFECTION, HX OF  (ICD-V12.09)  dont see this as a pathogen in her but rather just colonizer of usual area of the body  Orders: Consultation Level IV (78295)  Medications Added to Medication List This Visit: 1)  Lamictal 150 Mg Tabs (Lamotrigine) 2)  Tamoxifen Citrate 20 Mg Tabs (Tamoxifen citrate) 3)  Trazodone Hcl 100 Mg Tabs (Trazodone hcl) 4)  Fluconazole 200 Mg Tabs (Fluconazole) .... Take 1 tablet by mouth once a day for two weeks 5)  Astepro 0.15 % Soln (Azelastine hcl) 6)  Atenolol 50 Mg Tabs (Atenolol) 7)  Centany 2 % Oint (Mupirocin) 8)  Cloderm 0.1 % Crea (Clocortolone pivalate) 9)  Effexor Xr 150 Mg Xr24h-cap (Venlafaxine hcl) 10)  Ertaczo 2 % Crea (Sertaconazole nitrate) 11)  Flonase 50 Mcg/act Susp (Fluticasone propionate) 12)  Vicodin 5-500 Mg Tabs (Hydrocodone-acetaminophen) 13)  Meclizine Hcl 25 Mg Chew (Meclizine hcl) 14)  Nexium 40 Mg Cpdr (Esomeprazole magnesium) 15)  Senna S 8.6-50 Mg Tabs (Sennosides-docusate sodium) 16)  Triamterene-hctz 37.5-25 Mg Caps (Triamterene-hctz) 17)  Zyrtec Allergy 10 Mg Caps (Cetirizine hcl)   Visit Type:  Consult Referring Provider:  Dr Terri Piedra Primary Provider:  Dr, Reche Dixon  CC:  new patient referred from Dr. Terri Piedra.  History of Present Illness: 50 year old with history of rash that occurred on buttocks and perineum that  was ultimately dx as MRSA infectiion and finally improved after prompt antibacterial antibiotics including inpt course of IV abx. She has had intensely pruritic rash along the crease of her buttocks for several months and has been trated wtih various creams and topical agents. A culture done from [facility] had shown some yeast as wella s Group B strep. She is currently on topical mupricon, cloderm and an antifungal with very little improvement. She was referred to [place] an urgent infectious diseases consult. She is without fevers chills or malaise but does have intense pruritis alog her buttocks crease.    Problems Prior to Update: 1)   COPD  (ICD-496) 2)  Hypertension  (ICD-401.9) 3)  Hyperlipidemia  (ICD-272.4) 4)  Gerd  (ICD-530.81) 5)  Depression  (ICD-311)  Medications Prior to Update: 1)  Stratera 40mg  .... 2 Tablets Q Am 2)  Protonix 40mg  .... One By Mouth Once Daily 3)  Tazadone 50mg  .... 1and 1/2 Tablets By Mouth At Bedtime 4)  Darvocet .... One By Mouth Three Times A Day As Needed 5)  Ultram 50mg  .... One By Mouth Q 8hrs 6)  Effexor Dose Per Mental Health 7)  Lamitical Dose Per Mental Health Provider 8)  Senna Otc 9)  Vi T D With Ca Otc 10)  Vit E  Otc 11)  Atenolol 50 Mg Tab (Atenolol) .... Take One (1) Tablet By Mouth Daily  Current Medications (verified): 1)  Atenolol 50 Mg Tab (Atenolol) .... Take One (1) Tablet By Mouth Daily 2)  Lamictal 150 Mg Tabs (Lamotrigine) 3)  Tamoxifen Citrate 20 Mg Tabs (Tamoxifen Citrate) 4)  Trazodone Hcl 100 Mg Tabs (Trazodone Hcl) 5)  Fluconazole 200 Mg Tabs (Fluconazole) .... Take 1 Tablet By Mouth Once A Day For Two Weeks 6)  Astepro 0.15 % Soln (Azelastine Hcl) 7)  Atenolol 50 Mg Tabs (Atenolol) 8)  Centany 2 % Oint (Mupirocin) 9)  Cloderm 0.1 % Crea (Clocortolone Pivalate) 10)  Effexor Xr 150 Mg Xr24h-Cap (Venlafaxine Hcl) 11)  Ertaczo 2 % Crea (Sertaconazole Nitrate) 12)  Flonase 50 Mcg/act Susp (Fluticasone Propionate) 13)  Vicodin 5-500 Mg Tabs (Hydrocodone-Acetaminophen) 14)  Meclizine Hcl 25 Mg Chew (Meclizine Hcl) 15)  Nexium 40 Mg Cpdr (Esomeprazole Magnesium) 16)  Senna S 8.6-50 Mg Tabs (Sennosides-Docusate Sodium) 17)  Triamterene-Hctz 37.5-25 Mg Caps (Triamterene-Hctz) 18)  Zyrtec Allergy 10 Mg Caps (Cetirizine Hcl)  Allergies: 1)  ! Iodine 2)  ! Sulfa 3)  ! * Steriods 4)  ! * Avelox   Past History:  Past Surgical History: TKA 2 months ago   Family History: noncontributory  Social History: nonsmoker, rare etoh   Review of Systems       see HPI otherwise negative on 12 pt review   Patient Instructions: 1)  I would stop all of  the topical creams for now and take fluconazole 200mg  for two weeks 2)  rtc to see Dr Daiva Eves in one month  CC: new patient referred from Dr. Terri Piedra Is Patient Diabetic? No Pain Assessment Patient in pain? no      Nutritional Status BMI of > 30 = obese  Does patient need assistance? Functional Status Self care Ambulation Normal   Medical History Prior Medications: ATENOLOL 50 MG TAB (ATENOLOL) Take one (1) tablet by mouth daily Current Allergies (reviewed today): ! IODINE ! SULFA ! * STERIODS ! * AVELOX Orders Added: 1)  Consultation Level IV [14782] Prescriptions: FLUCONAZOLE 200 MG TABS (FLUCONAZOLE) Take 1 tablet by mouth once a day for  two weeks  #14 x 3   Entered and Authorized by:   Acey Lav MD   Signed by:   Paulette Blanch Dam MD on 04/13/2010   Method used:   Electronically to        [facility] [address] # [phone]* (retail)       [address]       [place], [place]  [address]       Ph: [phone]       Fax: [phone]   RxID:   [id number] FLUCONAZOLE 200 MG TABS (FLUCONAZOLE) Take 1 tablet by mouth once a day for two weeks  #14 x 3   Entered and Authorized by:   Acey Lav MD   Signed by:   Paulette Blanch Dam MD on 04/13/2010   Method used:   Print then Give to Patient   RxID:   [id number]

## 2010-04-23 NOTE — Telephone Encounter (Signed)
Spoke with the patient and she started taking the Doxy last night and she will see how she feels over the week end and if symptoms still persist she will call the office on Monday.

## 2010-04-24 ENCOUNTER — Ambulatory Visit: Payer: Medicare Other | Admitting: Physical Therapy

## 2010-04-27 ENCOUNTER — Ambulatory Visit: Payer: Medicare Other | Attending: Orthopedic Surgery

## 2010-04-27 DIAGNOSIS — Z96659 Presence of unspecified artificial knee joint: Secondary | ICD-10-CM | POA: Insufficient documentation

## 2010-04-27 DIAGNOSIS — R269 Unspecified abnormalities of gait and mobility: Secondary | ICD-10-CM | POA: Insufficient documentation

## 2010-04-27 DIAGNOSIS — R262 Difficulty in walking, not elsewhere classified: Secondary | ICD-10-CM | POA: Insufficient documentation

## 2010-04-27 DIAGNOSIS — IMO0001 Reserved for inherently not codable concepts without codable children: Secondary | ICD-10-CM | POA: Insufficient documentation

## 2010-04-27 DIAGNOSIS — M6281 Muscle weakness (generalized): Secondary | ICD-10-CM | POA: Insufficient documentation

## 2010-04-27 DIAGNOSIS — M25669 Stiffness of unspecified knee, not elsewhere classified: Secondary | ICD-10-CM | POA: Insufficient documentation

## 2010-04-29 ENCOUNTER — Ambulatory Visit: Payer: Medicare Other | Admitting: Physical Therapy

## 2010-04-30 ENCOUNTER — Ambulatory Visit: Payer: Medicare Other | Admitting: Physical Therapy

## 2010-05-02 LAB — BASIC METABOLIC PANEL WITH GFR
BUN: 9 mg/dL (ref 6–23)
CO2: 27 meq/L (ref 19–32)
Calcium: 9.5 mg/dL (ref 8.4–10.5)
Chloride: 98 meq/L (ref 96–112)
Creatinine, Ser: 0.79 mg/dL (ref 0.4–1.2)
GFR calc Af Amer: >60 mL/min (ref >60)
GFR calc non Af Amer: >60 mL/min (ref >60)
Glucose, Bld: 110 mg/dL — ABNORMAL HIGH (ref 70–99)
Potassium: 3.8 meq/L (ref 3.5–5.1)
Sodium: 136 meq/L (ref 135–145)

## 2010-05-04 ENCOUNTER — Ambulatory Visit: Payer: Medicare Other | Admitting: Physical Therapy

## 2010-05-06 ENCOUNTER — Ambulatory Visit: Payer: Medicare Other | Admitting: Physical Therapy

## 2010-05-08 ENCOUNTER — Ambulatory Visit: Payer: Medicare Other | Admitting: Physical Therapy

## 2010-05-11 ENCOUNTER — Ambulatory Visit: Payer: Medicare Other | Admitting: Physical Therapy

## 2010-05-11 ENCOUNTER — Encounter: Payer: Medicare Other | Admitting: Physical Therapy

## 2010-05-12 LAB — BASIC METABOLIC PANEL
BUN: 15 mg/dL (ref 6–23)
CO2: 29 mEq/L (ref 19–32)
Calcium: 9.8 mg/dL (ref 8.4–10.5)
Creatinine, Ser: 0.94 mg/dL (ref 0.4–1.2)
GFR calc non Af Amer: >60 mL/min (ref >60)
Glucose, Bld: 110 mg/dL — ABNORMAL HIGH (ref 70–99)
Sodium: 137 mEq/L (ref 135–145)

## 2010-05-13 ENCOUNTER — Encounter: Payer: Medicare Other | Admitting: Physical Therapy

## 2010-05-15 ENCOUNTER — Ambulatory Visit: Payer: Medicare Other | Admitting: Physical Therapy

## 2010-05-18 ENCOUNTER — Other Ambulatory Visit: Payer: Self-pay | Admitting: General Surgery

## 2010-05-18 ENCOUNTER — Ambulatory Visit: Payer: Medicare Other | Admitting: Physical Therapy

## 2010-05-18 DIAGNOSIS — Z0000 Encounter for general adult medical examination without abnormal findings: Secondary | ICD-10-CM

## 2010-05-18 DIAGNOSIS — Z9889 Other specified postprocedural states: Secondary | ICD-10-CM

## 2010-05-19 ENCOUNTER — Ambulatory Visit (INDEPENDENT_AMBULATORY_CARE_PROVIDER_SITE_OTHER): Payer: Medicare Other | Admitting: Internal Medicine

## 2010-05-19 ENCOUNTER — Ambulatory Visit: Payer: Medicare Other | Admitting: Internal Medicine

## 2010-05-19 ENCOUNTER — Encounter: Payer: Self-pay | Admitting: Internal Medicine

## 2010-05-19 DIAGNOSIS — L538 Other specified erythematous conditions: Secondary | ICD-10-CM

## 2010-05-19 NOTE — Assessment & Plan Note (Signed)
I do not believe that this problem is due to a bacterial skin infection. The area does not have the appearance of cellulitis. It does look like cutaneous yeast infection but I'm not sure why she has not improved with topical and systemic antifungal therapy. It is very chronic in nature which again goes against this being bacterial infection. She is going to call back with information about the medication she took several years ago that led to improvement and I will try to locate records from her hospitalization.

## 2010-05-19 NOTE — Progress Notes (Signed)
  Subjective:    Patient ID: Anita Arroyo, female    DOB: 13-Apr-1960, 50 y.o.   MRN: [id number]  HPI Anita Arroyo is a 50 year old who was seen by my partner, Dr. Daiva Eves, one month ago for a perineal rash. Apparently she was admitted to [facility] in 2009 with a bad rash bilaterally on her buttocks. She says that she believes it was due to staph infection. I cannot find any records of this admission. The last few years she's been followed by Wyvonne Lenz and Dr. Merlyn Albert Lupton's office for this rash. She recalls being given some topical medication a few years ago that helped but then the rash returned and has persisted. She was being treated with topical mupirocin ointment, Cloderm, and an antifungal cream but was not getting better and was referred to Dr. Daiva Eves. He felt that it was probably due to cutaneous yeast infection and put her on oral fluconazole. She states that she noticed more itching and burning after stopping the topical creams and did not feel the fluconazole is helping. A superficial culture that had been done in Dr. Dorita Sciara office apparently had grown group B strep so she was switched to oral doxycycline to cover strep and staph. She thinks she might have gotten marginally better but called yesterday and wanted to be seen again because he feels like she is not getting any answers or relief. She has not had any fever.    Review of Systems     Objective:   Physical Exam  Skin:       Skin around the gluteal crease extending from the coccyx throughout the perineum is red and moist with a few satellite lesions. There is no induration or drainage.          Assessment & Plan:

## 2010-05-20 ENCOUNTER — Ambulatory Visit: Payer: Medicare Other

## 2010-05-20 ENCOUNTER — Ambulatory Visit
Admission: RE | Admit: 2010-05-20 | Discharge: 2010-05-20 | Disposition: A | Discharge status: Home / Self Care | Payer: Medicare Other | Source: Ambulatory Visit | Attending: General Surgery | Admitting: General Surgery

## 2010-05-20 ENCOUNTER — Encounter: Payer: Medicare Other | Admitting: Physical Therapy

## 2010-05-20 DIAGNOSIS — Z9889 Other specified postprocedural states: Secondary | ICD-10-CM

## 2010-05-22 ENCOUNTER — Ambulatory Visit: Payer: Medicare Other | Admitting: Physical Therapy

## 2010-05-25 ENCOUNTER — Ambulatory Visit: Payer: Medicare Other | Admitting: Physical Therapy

## 2010-05-29 ENCOUNTER — Ambulatory Visit: Payer: Medicare Other | Attending: Orthopedic Surgery | Admitting: Physical Therapy

## 2010-05-29 DIAGNOSIS — IMO0001 Reserved for inherently not codable concepts without codable children: Secondary | ICD-10-CM | POA: Insufficient documentation

## 2010-05-29 DIAGNOSIS — R269 Unspecified abnormalities of gait and mobility: Secondary | ICD-10-CM | POA: Insufficient documentation

## 2010-05-29 DIAGNOSIS — M6281 Muscle weakness (generalized): Secondary | ICD-10-CM | POA: Insufficient documentation

## 2010-05-29 DIAGNOSIS — Z96659 Presence of unspecified artificial knee joint: Secondary | ICD-10-CM | POA: Insufficient documentation

## 2010-05-29 DIAGNOSIS — M25669 Stiffness of unspecified knee, not elsewhere classified: Secondary | ICD-10-CM | POA: Insufficient documentation

## 2010-05-29 DIAGNOSIS — R262 Difficulty in walking, not elsewhere classified: Secondary | ICD-10-CM | POA: Insufficient documentation

## 2010-05-31 ENCOUNTER — Emergency Department (HOSPITAL_COMMUNITY): Payer: Medicare Other

## 2010-05-31 ENCOUNTER — Emergency Department (HOSPITAL_COMMUNITY)
Admission: EM | Admit: 2010-05-31 | Discharge: 2010-05-31 | Disposition: A | Discharge status: Home / Self Care | Payer: Medicare Other | Attending: Emergency Medicine | Admitting: Emergency Medicine

## 2010-05-31 DIAGNOSIS — S51809A Unspecified open wound of unspecified forearm, initial encounter: Secondary | ICD-10-CM | POA: Insufficient documentation

## 2010-05-31 DIAGNOSIS — IMO0001 Reserved for inherently not codable concepts without codable children: Secondary | ICD-10-CM | POA: Insufficient documentation

## 2010-06-01 ENCOUNTER — Encounter: Payer: Medicare Other | Admitting: Physical Therapy

## 2010-06-02 ENCOUNTER — Ambulatory Visit: Payer: Medicare Other

## 2010-06-04 ENCOUNTER — Telehealth: Payer: Self-pay | Admitting: Licensed Clinical Social Worker

## 2010-06-04 NOTE — Telephone Encounter (Signed)
Patient is still experiencing discomfort from rash on buttocks, would like to know what she should because she is planning on going out of town this weekend. She also wants to know if Dr. Orvan Falconer was able to talk to her dermatologist.

## 2010-06-05 ENCOUNTER — Telehealth: Payer: Self-pay | Admitting: *Deleted

## 2010-06-05 ENCOUNTER — Ambulatory Visit: Payer: Medicare Other | Admitting: Physical Therapy

## 2010-06-05 NOTE — Telephone Encounter (Signed)
Patient called back upset because she had not heard back on advice from Dr. Orvan Falconer.  Spoke to [place] and we did receive notes from dermatologist.  Will call patient back after Dr. Orvan Falconer sees note. Wendall Mola CMA

## 2010-06-09 ENCOUNTER — Telehealth: Payer: Self-pay | Admitting: Internal Medicine

## 2010-06-09 NOTE — H&P (Signed)
Anita Arroyo, Anita Arroyo                   ACCOUNT NO.:  [id number]   MEDICAL RECORD NO.:  [id number]          PATIENT TYPE:  INP   LOCATION:  1501                         FACILITY:  [facility]   PHYSICIAN:  Beckey Rutter, MD  DATE OF BIRTH:  07-30-1960   DATE OF ADMISSION:  04/07/2007  DATE OF DISCHARGE:                              HISTORY & PHYSICAL   PRIMARY CARE PHYSICIAN:  The patient goes to [facility].   CHIEF COMPLAINT:  Rectal pain.   HISTORY OF PRESENT ILLNESS:  This is a 50 year old Caucasian female with  past medical history significant for attention deficit disorder, bipolar  disorder, depression, fibromyalgia, hemorrhoids, presented today to the  emergency department, because of severe rectal pain.  The rectal pain  started about a week ago, when she visited the Urgent Care Clinic, and  antifungal was prescribed.  The patient did not feel any relief.  Actually, her pain was progressively worsened.  The patient noticed  discharge in the area, and she noticed extreme pain with defecation.  She had history of hemorrhoids for many years with minor bleeding on and  off, but no similar condition like what she is experiencing now.  She  denied any trauma or sexual intercourse.  She also denied bleeding, but  she admits to having some itchy feeling.  She noticed she has some  fever, but she could not give a count of how much and more detail about  this fever.   PAST MEDICAL HISTORY:  Significant for bipolar disorder, depression,  fibromyalgia, hemorrhoid, mitral valve prolapse and attention deficit  disorder.   FAMILY HISTORY:  The patient is adopted.   SURGICAL HISTORY:  History of left ankle surgeries.   SOCIAL HISTORY:  She denied ethanol, alcohol.  She is a current smoker.   ALLERGIES:  MEDICATION ALLERGIES: SHE IS ALLERGIC TO AUGMENTIN, AVELOX,  CORTISONE, IODINE, SULFA.  THE PATIENT COULD NOT GIVE A SPECIFIC  REACTION FOR ALL OF THOSE ALLERGIES.   MEDICATIONS:  1.  Trazodone 100 mg daily.  2. Lamictal 200 mg daily.  3. Fluticasone 50 mcg daily.  4. Nexium 40 mg daily.  5. Allegra 180 mg daily.  6. Effexor 150 mg.  7. Atenolol 50 mg daily.  8. Darvocet N-100 p.r.n.  9. Adderall 220 mg, 20 mg daily.  10.Ibuprofen as needed.  11.Meclizine 25 mg as needed.  12.Loratadine 100 mg daily.  13.Nystatin topical, recently prescribed.  14.Doxycycline, dose not specified.   REVIEW OF SYSTEMS:  As per HPI.   EXAMINATION:  GENERAL:  The person is alert and oriented, seemed very  apprehensive.  VITAL SIGNS:  Temperature  is 97.7, blood pressure 89 x 50, pulse 82,  respiratory rate is 20.  HEENT:  Head:  Atraumatic, normocephalic.  Eyes:  PERRL.  Mouth moist.  No ulcer.  NECK:  Supple.  No JVD.  Precordium:  First and second heart sounds.  Patient is not cooperative with the exam.  LUNGS:  Bilateral fair air entry.  No adventitious sounds.  ABDOMEN:  Soft, nontender.  GENITOURINARY:  Examination and rectal examination was  showing redness  around the anal verge, with pus and discharge around the area.  The  patient could not tolerate rectal examination.  She was very  apprehensive, even with the attempt to separate her butt cheeks.  There  is an area of redness around the anal verge that extended to the  surrounding area, about 5 cm.   LABS AND X-RAY:  CT scan of pelvis showing no evidence of perianal  abscesses but is positive for minimal stranding area around the anal  canals.  Her sodium is 138, potassium 4.7, chloride 99, bicarb 28,  glucose 91, BUN is 7, creatinine 0.70.  Urinalysis is showing urine WBCs  0-2 with few bacteria.  Urine color is yellow, clear, with negative  glucose and ketones and negative nitrate and leukocyte esterase.  White  blood count is 9.0, hemoglobin is 15.6, hematocrit 44.8, platelet count  is 223.   ASSESSMENT AND PLAN:  This is 50 year old female with proctitis picture.   PLAN:  1. The patient will be admitted for  further assessment and management.  2. Will start the patient on antibiotic Zosyn and Flagyl, and we will      send for discharge culture.  3. The patient will have adequate pain control and will reassess the      patient, if she can not tolerate rectal exam after pain medication      administration.  4. Will have loose threshold for surgical consultation.  5. The patient will be continued on her medication for depression,      bipolar and attention deficit disorder.  For DVT prophylaxis, we      will start Lovenox for GI prophylaxis.  Will continue on PPI.      Beckey Rutter, MD  Electronically Signed     EME/MEDQ  D:  04/07/2007  T:  04/08/2007  Job:  [id number]

## 2010-06-09 NOTE — Op Note (Signed)
Anita Arroyo, Anita Arroyo                   ACCOUNT NO.:  [id number]   MEDICAL RECORD NO.:  [id number]          PATIENT TYPE:  AMB   LOCATION:  DSC                          FACILITY:  [facility]   PHYSICIAN:  Loreta Ave, M.D. DATE OF BIRTH:  1960/08/10   DATE OF PROCEDURE:  09/19/2008  DATE OF DISCHARGE:                               OPERATIVE REPORT   PREOPERATIVE DIAGNOSIS:  Left wrist post-traumatic synovitis question  triangular fibrocartilage tear.   POSTOPERATIVE DIAGNOSIS:  Left wrist post-traumatic synovitis question  triangular fibrocartilage tear with some fraying on the top of the  triangular fibrocartilage but no full-thickness tears.   PROCEDURE:  Left wrist exam under anesthesia arthroscopy, partial  synovectomy, superficial debridement triangular fibrocartilage complex.   SURGEON:  Loreta Ave, MD   ASSISTANT:  Zonia Kief, PA   ANESTHESIA:  General.   BLOOD LOSS:  Minimal.   SPECIMENS:  None.   CULTURES:  None.   COMPLICATIONS:  None.   DRESSING:  Soft compressive.   TOURNIQUET TIME:  45 minutes.   PROCEDURE:  The patient was brought to the operating room and placed on  the operating table in supine position.  After adequate anesthesia have  been obtained, tourniquet applied and prepped and draped in usual  sterile fashion.  The tower for wrist arthroscopy assembled and  approximately 10-12 pounds of pressure was applied through finger traps.  Two dorsal portals, radial one between the third and fourth compartments  the ulnar one between the fifth and six compartments.  A small incision  blunt obturator used to get into the wrist.  The arthroscope introduced  and wrist distended and inspected.  Articular cartilage looked good.  Capsular ligamentous structures intact.  Some focal synovitis impinging  on the ulnar side dorsal greater than volar was excised with a shaver.  A little fraying on the top  of the TFCC debrided.  No full-thickness tears.  All of  the structures  normal.  Instruments and fluid removed.  Portals were closed with nylon.  Wrist injected with Marcaine.  Sterile compressive dressing applied.  Anesthesia reversed.  Brought to the recovery room.  Tolerated surgery  well.  No complications.      Loreta Ave, M.D.  Electronically Signed     DFM/MEDQ  D:  09/19/2008  T:  09/20/2008  Job:  [id number]

## 2010-06-09 NOTE — Op Note (Signed)
Anita Arroyo, Anita Arroyo                   ACCOUNT NO.:  [id number]   MEDICAL RECORD NO.:  [id number]          PATIENT TYPE:  AMB   LOCATION:  DSC                          FACILITY:  [facility]   PHYSICIAN:  Loreta Ave, M.D. DATE OF BIRTH:  Jan 23, 1961   DATE OF PROCEDURE:  03/13/2008  DATE OF DISCHARGE:                               OPERATIVE REPORT   PREOPERATIVE DIAGNOSES:  1. Lateral epicondylitis, right elbow with tearing, extensor carpi      radialis brevis tendon.  2. Synovitis, left wrist.   POSTOPERATIVE DIAGNOSES:  1. Lateral epicondylitis, right elbow with tearing, extensor carpi      radialis brevis tendon.  2. Synovitis, left wrist.   PROCEDURE:  1. Exploration of right elbow with debridement of extensor carpi      radialis brevis tendon.  Joint exploration and debridement.      Debridement and drilling, epicondyle.  Primary repair of      superficial extensors over defect.  2. Injection, left wrist intraarticular with Depo Medrol and Marcaine,      dorsal approach.   SURGEON:  Loreta Ave, MD   ASSISTANT:  Genene Churn. Barry Dienes, PA   ANESTHESIA:  General.   BLOOD LOSS:  Minimal.   SPECIMEN:  None.   CULTURES:  None.   COMPLICATIONS:  None.   DRESSING:  Soft compressive with a sugar-tong splint on the right.   TOURNIQUET TIME:  On the right 40 minutes.   PROCEDURE:  The patient was brought to the operating room and placed on  the operating table in the supine position.  After adequate anesthesia  had been obtained, the left wrist exposed.  Under sterile technique,  injected intraarticularly with Depo-Medrol and Marcaine without  difficulty.  Band-Aid applied.  Attention turned to the right.  Tourniquet applied.  Exsanguinated with elevation of Esmarch.  Tourniquet inflated to 250 mmHg.  Longitudinal incision from the  epicondyle distally.  Skin and subcutaneous tissue divided.  Superficial  extensors intact, divided longitudinally.  Marked mucinous  degeneration,  tearing of the ECRB tendon from the epicondyle all the way down the  radial head.  The entire capsule degenerative below.  All of this  completely excised back to healthy tissue.  Epicondyle debrided, treated  with multiple drilling.  The joint exposed and no degenerative changes,  no loose bodies, no chondromalacia.  Wound irrigated.  Superficial  extensors were approximated over the defect with a  running Vicryl.  Skin and subcutaneous tissue with subcuticular closure  with Vicryl.  Steri-Strips applied.  Sterile compressive dressing  applied.  Tourniquet deflated and removed.  Sugar-tong splint applied.  Anesthesia reversed.  Brought to the recovery room.  Tolerated surgery  well.  No complications.      Loreta Ave, M.D.  Electronically Signed     DFM/MEDQ  D:  03/13/2008  T:  03/13/2008  Job:  [id number]

## 2010-06-09 NOTE — Consult Note (Signed)
NAMEANTRICE, Anita Arroyo                   ACCOUNT NO.:  [id number]   MEDICAL RECORD NO.:  [id number]          PATIENT TYPE:  INP   LOCATION:  1501                         FACILITY:  [facility]   PHYSICIAN:  Clovis Pu. Cornett, M.D.DATE OF BIRTH:  December 21, 1960   DATE OF CONSULTATION:  04/07/2007  DATE OF DISCHARGE:                                 CONSULTATION   REASON FOR CONSULTATION:  Proctitis.   CHIEF COMPLAINT:  Rectal pain.   HISTORY OF PRESENT ILLNESS:  The patient is a 50 year old female with a  1 week history of perianal pain and drainage.  She was seen at urgent  care a few days ago and then came to the emergency room last night or  this morning due to rectal pain.  She has had apparently a fever with  it.  CT scan of her pelvis was obtained which showed no perirectal  abscess.  On exam she had excoriation of her perianal skin with  tenderness and a white discharge noted.  I was asked to see her at the  request of Dr. Tamsen Roers in consultation for this.  She is complaining of  perirectal pain when she sits.  She has had a fever with it.  There is a  whitish discharge noted with it.  Her bowel function has been normal and  she has no dysuria.   PAST MEDICAL HISTORY:  1. Fibromyalgia.  2. History of hemorrhoids.  3. Bipolar disorder.  4. Depression.  5. Mitral valve prolapse.   PAST SURGICAL HISTORY:  Multiple ankle and knee surgeries and wisdom  teeth surgery.   ALLERGIES:  IODINE, AVELOX, AUGMENTIN, CORTISONE, SULFA.   FAMILY HISTORY:  Positive for cancer.   CURRENT MEDICATIONS:  1. Trazodone 100 mg a day.  2. Lamictal 200 mg a day.  3. Fluticasone nasal 2 mg a day.  4. Nexium 40 mg a day.  5. Allegra 180 daily.  6. Effexor 150 daily.  7. Atenolol 50 daily.  8. Darvocet 3 as needed.  9. Adderall 20 mg daily.  10.Meclizine 25 mg as needed.  11.Crestor 10 mg.  12.Loratadine 10 mg.   REVIEW OF SYSTEMS:  As stated above, otherwise 15 point review of  systems is negative.   PHYSICAL EXAMINATION:  GENERAL:  A white female in no apparent distress.  RECTAL:  Perianal canal is excoriated, red, somewhat weeping with a  white looking discharge.  There is no obvious mass or abscess.  There is  some external hemorrhoid disease noted with some mild grade 3 prolapse  of internal hemorrhoid disease especially on the patient's left.  I did  not do a digital examination due to patient discomfort.  HEENT:  Extraocular movements are intact.  Oropharynx is clear.  NECK:  Supple.  Nontender.  No mass.  PULMONARY:  Lung sounds clear.  CHEST:  Wall motion normal.  CARDIOVASCULAR:  Regular rate and rhythm without rubs, murmur, gallop.  EXTREMITIES:  Warm, well perfused.  ABDOMEN:  Soft, nontender without rebound or guarding.   DIAGNOSTIC STUDIES:  CT reviewed which shows no evidence of perirectal  abscess.   IMPRESSION:  1. Proctitis.  2. Internal and external hemorrhoid disease.  3. ADD (attention deficit disorder).  4. Bipolar disease.  5. Fibromyalgia.   PLAN:  I do not see any surgical need at this point for her.  I  recommend warm tub soaks for her, IV antibiotics and a stool softener.  She may need workup as an outpatient to look at her hemorrhoid disease  once her proctitis has cleared but I see no immediate surgical need for  her.  She could also have issues with yeast as well and may benefit from  a topical nystatin.  I would be happy to see her as an outpatient to  further look at her hemorrhoids but she needs nothing acutely from [place] at  this point in time.   Thank you for this consultation.      Thomas A. Cornett, M.D.  Electronically Signed     TAC/MEDQ  D:  04/07/2007  T:  04/08/2007  Job:  [id number]   cc:   Beckey Rutter, MD

## 2010-06-09 NOTE — Telephone Encounter (Signed)
I spoke with Anita Arroyo and told her I am waiting on the results of the DTM fungal culture done at the time of her last dermatology visit.

## 2010-06-10 ENCOUNTER — Telehealth: Payer: Self-pay | Admitting: Internal Medicine

## 2010-06-10 DIAGNOSIS — L304 Erythema intertrigo: Secondary | ICD-10-CM

## 2010-06-10 MED ORDER — FLUCONAZOLE 100 MG PO TABS: 100.0000 mg | ORAL_TABLET | Freq: Every day | ORAL | Status: AC

## 2010-06-10 NOTE — Telephone Encounter (Signed)
Anita Arroyo continues to be bothered by the irritation in her gluteal crease that has the appearance of candida intertrigo. I reviewed her dermatology records and notes that she did have a culture positive for yeast in January. She has tried multiple topical ointments and creams including antifungal creams and corticosteroids without apparent benefit. Her medication list here shows that at some point she might of been on oral fluconazole but she does not remember taking it, it is not mentioned in any of her dermatology notes and it was not prescribed by me or my partner, Dr. Daiva Eves. Given that this problem is so recalcitrant I would suggest that she try oral fluconazole 100 mg daily for 4-[redacted] weeks along with topical cornstarch as a drying agent. She agrees with that plan.

## 2010-06-12 NOTE — Discharge Summary (Signed)
Anita Arroyo, Anita Arroyo                   ACCOUNT NO.:  [id number]   MEDICAL RECORD NO.:  [id number]          PATIENT TYPE:  INP   LOCATION:  1501                         [facility]AC[facility]   PHYSICIAN:  Wilson Singer, M.D.DATE OF BIRTH:  1960/07/04   DATE OF ADMISSION:  04/07/2007  DATE OF DISCHARGE:  04/10/2007                               DISCHARGE SUMMARY   FINAL DISCHARGE DIAGNOSES:  1. Proctitis.  2. Bipolar disorder.   CONDITION ON DISCHARGE:  Stable.   MEDICATIONS ON DISCHARGE:  1. Trazodone 100 mg daily.  2. Lamictal 200 mg daily.  3. Fluticasone nasal spray daily.  4. Nexium 40 mg daily.  5. Allegra 180 mg daily.  6. Effexor XR 150 mg daily.  7. Adderall 20 mg b.i.d.  8. Ibuprofen as needed.  9. Meclizine 25 mg as needed.  10.Crestor 10 mg daily.  11.Percocet 5 mg one pill t.i.d. as needed.  12.Colace 100 mg b.i.d.  13.MiraLax 17 grams daily as needed.   HISTORY:  This 50 year old lady was admitted with rectal pain.  Please  see initial history and physical examination by Dr. Tamsen Roers.   HOSPITAL PROGRESS:  The patient was admitted and symptomatic local pain  relief was administered.  She was also started on Flagyl and Zosyn for  her proctitis.  She was seen by Dr. Harriette Bouillon, who is surgery, and  he did not see any surgical need at this point.  There was internal and  external hemorrhoid disease.  Therefore, she was treated conservatively  with sitz baths as well antibiotics intravenously and then orally.  CT  scan of the pelvis showed some minimal perineal stranding but no  perirectal abscess.  She progressed to do well and was able to be  discharged home with oral doxycycline 100 mg b.i.d. and she will finish  a course of this.  She will have sitz baths and wound packing as  instructed prior to discharge, and will follow up with her primary care  physician at [facility] in the next week or so.     Wilson Singer, M.D.  Electronically Signed    NCG/MEDQ  D:  05/12/2007  T:  05/12/2007  Job:  [id number]
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
## 2010-06-12 NOTE — Op Note (Signed)
[place]. [facility]  Patient:    Anita Arroyo, Anita Arroyo                          MRN: [id number] Proc. Date: 03/10/00 Adm. Date:  [id number] Attending:  Colbert Ewing                           Operative Report  PREOPERATIVE DIAGNOSIS:  Status post pylon fracture of left ankle with painful retained hardware, tibia and fibula.  Progressive degenerative arthritis, left ankle.  POSTOPERATIVE DIAGNOSIS:  Status post pylon fracture of left ankle with painful retained hardware, tibia and fibula.  Progressive degenerative arthritis, left ankle.  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  OPERATION PERFORMED:  Left ankle examination under anesthesia.  Removal of fibular interfragmentary screw x 1 and tibial interfragmentary screws x 2. Arthroscopy with extensive debridement of ankle joint.  SURGEON:  Loreta Ave, M.D.  ASSISTANT:  Arlys John D. Petrarca, P.A.-C.  ANESTHESIA:  General.  ESTIMATED BLOOD LOSS:  Minimal.  TOURNIQUET TIME:  45 minutes.  SPECIMENS:  None.  CULTURES:  None.  COMPLICATIONS:  None.  DRESSING:  Soft compressive.  DESCRIPTION OF PROCEDURE:  Patient was brought to the operating room and placed on the operating table in supine position.  After adequate anesthesia had been obtained, placed in the leg support for ankle arthroscopy.  Prepped and draped in the usual sterile fashion.  Exsanguinated with elevation and Esmarch.  Tourniquet inflated to 350 mmHg.  With fluoroscopic guidance, I used the previous medial incision to expose the anteromedial tibia.  Two screws in that area were easily accessed and removed.  Then used the previous lateral incision to expose the loose symptomatic screw over the fibula.  Skin and subcutaneous tissues divided.  Screw head accessed and backed out without difficulty.  Wound was irrigated and closed with nylon.  Anteromedial and anterolateral ankle portals created.  The ankle entered, distended  and inspected.  Extensive osteochondral fragmentation and progression to grade 4 changes over more than 80% of the ankle.  All loose bodies, chondral fragments, osteochondral fragments, synovitis and adhesions debrided and the ankle thoroughly assessed utilizing both portals with the arthroscope and shaver system.  Once this was completed, instruments and fluid removed. Portals closed with nylon.  Margins of all wounds injected with Marcaine. Sterile compressive dressing.  Tourniquet deflated and removed.  Anesthesia reversed.  Brought to recovery room.  Tolerated surgery well.  No complications. DD:  03/10/00 TD:  03/10/00 Job: [id number]

## 2010-06-12 NOTE — Op Note (Signed)
[place]. [facility]  Patient:    Anita Arroyo, Anita Arroyo                          MRN: [id number] Proc. Date: 03/15/99 Adm. Date:  [id number] Attending:  Trauma, Md                           Operative Report  PREOPERATIVE DIAGNOSIS:  Grade 2 open, markedly comminuted, distal tibial interarticular trimalleolar ankle fracture, including distal fibular shaft fracture which is closed, displaced.  No gross contamination.  POSTOPERATIVE DIAGNOSIS:  Grade 2 open, markedly comminuted, distal tibial interarticular trimalleolar ankle fracture, including distal fibular shaft fracture which is closed, displaced.  No gross contamination.  OPERATION:  Exploration, incision and drainage open left trimalleolar ankle fracture with open reduction and internal fixation of distal fibular fracture with seven-hole plate and screws.  Thorough incision and drainage, removal of small loose fragments and open reduction and internal fixation distal tibial trimalleolar interarticular component with interfragmentary screws x 4.  Loose reapproximation over a Penrose drain.  SURGEON:  Loreta Ave, M.D.  ASSISTANT:  Arlys John D. Petrarca, P.A.C.  ANESTHESIA:  General.  BLOOD LOSS:  Minimal.  TOURNIQUET TIME:  One hour.  SPECIMENS:  None.  CULTURES:  None.  COMPLICATIONS:  None.  DRESSINGS:  Soft compressive, with a bulky trauma dressing and splint.  DRAINS:  Penrose x 1.  DESCRIPTION OF PROCEDURE:  The patient was brought to the operating room and, after adequate anesthesia had been obtained, dressing taken down off the left ankle.  The anteromedial fragment of the distal tibia essentially fell out of the transverse medial wound as the leg was lifted for prepping.  This was captured n a sterile gauze and appropriately sterilely prepped and used as bone graft.  The pen wound, which was a 3 cm transverse laceration over the distal medial tibia, was  thoroughly prepped.  A  tourniquet was applied about the upper aspect of the right leg and the entire leg prepped and draped in the usual sterile fashion after initial extensive scrubbing and preparation of the open wound before a formal prepping and draping.  With the tourniquet deflated, the medial wound was opened with extension proximal posterior and distal anterior to allow exposure.  The fragment that essentially fell out of the wound at presentation was sterilely prepared with soaking in Betadine and then with pulsed irrigation with antibiotic solution and then sterile saline.  It accounted for at least one-third of the distal tibia, including a portion of the anterior wall and was structurally important.  This was a fragment that was 1.5 x 1.5 cm with anterior cortex. The medial wound was explored and thoroughly irrigated with 6 L of saline as well as 2 L of antibiotic solution.  Despite it being an open injury, this was not grossly contaminated.  It did extend down into the distal tibial wound as well as into he ankle.  The ankle itself was explored, with obvious marked destruction of the distal tibia, but with reasonable intact articular cartilage over the entire talus. Once I had thoroughly irrigated out the medial wound, the leg was elevated and he tourniquet inflated to 350 mmHg.  I proceeded with repair of the fibula in order to regain reasonable alignment and stabilization for repair of the distal fibula. A longitudinal incision was made over the fibula, which had marked comminution over a segment  of 2-3 cm.  Skin and subcutaneous tissue divided.  Neurovascular structures protected.  Subperiosteal exposure of the fibula.  With the fluoroscopy in use or guidance, I realigned this anatomically and then repaired it with a seven-hole,  one-third tubular plate.  The two central screws were used to capture fragments  back into the area of marked comminution.  I felt like I had realigned  her length as well as rotation and alignment, with proximal and distal fixation reasonable. Then proceeded back to the medial wound.  We were able to piece together the four major fragments of distal tibia.  The medial-most fragment was alignment to the  shaft on the medial side and fixed with two 4.5 cannulated lag screws that were  first placed with guide wires, overdrilled, and then firmly captured with lag screws.  I then replaced the bone graft that made up the anterior distal portion of the fragments, and this was fixed with two 4.0 screws that were placed through he anterior fragment and then down across the distal tibia into the posterolateral  fragment.  This allowed essentially capturing and fixation of the shaft posterolateral, anteromedial, and medial malleolar fragments all into a single piece, with fixation up into the shaft through the medial malleolar fragment. Despite the degree of comminution, this actually was very stable.  Entire construct examined visually as well as with the C-arm, and she had anatomic alignment on ll views.  When I brought her through motion on all views, all fragments were held in place without motion.  The medial wound was then copiously irrigated.  The deltoid was attached to the medial fragments and was essentially repaired when the medial malleolus was repaired.  The anterior capsule was left open, and a Penrose drain was brought from there out through the initial wound.  Surgical portion of the medial wound was closed with Vicryl and staples.  Central portion of the open wound left open with a Penrose.  The lateral wound was closed with Vicryl and staples. Margins of the wound injected with Marcaine on both sides.  Prior to closure, she was irrigated once again with another antibiotic solution with a pulse irrigating device.  After wounds closed, a bulky dressing applied, as well as a short-leg splint.  Tourniquet deflated.   Anesthesia reversed.  Brought to recovery room.  Tolerated surgery well, no complications. DD:  03/16/99 TD:  03/16/99 Job: [id number]

## 2010-06-12 NOTE — Op Note (Signed)
[place]  Patient:    Anita Arroyo, Anita Arroyo                          MRN: [id number] Proc. Date: 08/04/00 Adm. Date:  [id number] Attending:  Colbert Ewing                           Operative Report  PREOPERATIVE DIAGNOSIS:  End stage degenerative arthritis, left ankle, status post open displaced ankle fracture with a pattern of rapidly progressive post traumatic arthritis.  Retained plate over fibular fracture used for fixation.  POSTOPERATIVE DIAGNOSIS:  End stage degenerative arthritis, left ankle, status post open displaced ankle fracture with a pattern of rapidly progressive post traumatic arthritis.  Retained plate over fibular fracture used for fixation.  OPERATION PERFORMED:  Left ankle examination under anesthesia with arthroscopy with extensive debridement and denuding of remaining articular cartilage, exposing cancellous bone.  Ankle arthrodesis, fixation with 6.5 cannulated screws x 2 including removal of the distalmost screw from the fibular plate to allow access for the screw fixation for the arthrodesis.  SURGEON:  Loreta Ave, M.D.  ASSISTANT:  Arlys John D. Petrarca, P.A.-C.  ANESTHESIA:  General.  ESTIMATED BLOOD LOSS:  Minimal.  TOURNIQUET TIME:  One hour and 10 minutes.  SPECIMENS:  None.  CULTURES:  None.  COMPLICATIONS:  None.  DRESSING:  Soft compressive with short leg splint.  DESCRIPTION OF PROCEDURE:  The patient was brought to the operating room and after adequate anesthesia had been obtained, the left ankle was examined. Slight varus alignment.  No dorsiflexion.  Only about 20 degrees of plantar flexion, all with marked crepitus.  Tourniquet applied with a stirrup. Prepped and draped in the usual sterile fashion avoiding use of Betadine because of allergy.  Exsanguinated with elevation and Esmarch.  Tourniquet inflated to 250 mmHg.  Anteromedial and anterolateral arthroscopic portals utilized to access the  ankle.  Extensive grade 4 changes over more than half the joint.  A series of shavers and burs were used to remove all remaining articular cartilage and soft tissue debris from the ankle and expose bleeding cancellous bone on both sides.  With fluoroscopic guidance, I could then reduce her to a very acceptable plantar grade position in slight valgus alignment with good bony contact.  Once this was achieved, two vertical incisions were made, one over the fibula posteriorly and one over the tibia, both just above the mortise.  Guide wires were passed from the fibula and tibia across the ankle into the talus with the lateral screw extending down anteriorly in the talus and the medial screw extended slightly posteriorly. To allow this access, I removed the distal most screw from the fibular plate, not wanting to remove the entire plate because of the tenuous nature of healing of the fracture above the plate.  Once guide wires were found to be in good position, they were premeasured and the screws placed over the guide wires, yielding excellent fixation and alignment of the ankle in the fused position, plantar grade with slight valgus.  These were countersunk as much as possible medially and laterally.  Fluoroscopic views used to confirm good position and fixation with lag effect for the arthrodesis.  Guide wires were removed.  All wounds were then copiously irrigated and then closed with nylon. Margins of the wound was injected with Marcaine as was his ankle.  Sterile compressive dressing applied.  Tourniquet deflated and removed.  Short leg splint applied.  Anesthesia reversed.  Brought to recovery room.  Tolerated surgery well.  No complications. DD:  08/04/00 TD:  08/04/00 Job: [id number]

## 2010-06-12 NOTE — Op Note (Signed)
[place]  Patient:    Anita Arroyo, Anita Arroyo                          MRN: [id number] Proc. Date: 09/16/99 Adm. Date:  [id number] Attending:  Colbert Ewing                           Operative Report  PREOPERATIVE DIAGNOSIS:   Status post incision and drainage and open reduction internal fixation of open significantly displaced intra-articular distal tibial and fibular fractures left ankle.  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  POSTOPERATIVE DIAGNOSIS:  Status post incision and drainage and open reduction internal fixation of open significantly displaced intra-articular distal tibial and fibular fractures left ankle.  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  OPERATION PERFORMED:  Exposure of fibular nonunion with curettage and grafting corticocancellous from left iliac crest.  Revision of fixation to an eight hole titanium plate and interfragmentary 4.0 screws.  SURGEON:  Loreta Ave, M.D.  ASSISTANT:  Arlys John D. Petrarca, P.A.-C.  ANESTHESIA:  General.  ESTIMATED BLOOD LOSS:  Minimal.  TOURNIQUET TIME:  45 minutes left leg.  SPECIMENS:  None.  CULTURES:  None.  COMPLICATIONS:  None.  DRESSING:  Soft compressive to the hip and soft compressive with Cam walker at the leg.  DESCRIPTION OF PROCEDURE:  Patient was brought to the operating room and placed on the operating table in supine position.  After adequate anesthesia had been obtained, the left iliac crest and left leg prepped and draped in the usual sterile fashion.  Tourniquet then placed about the upper aspect of the left leg.  Exsanguinated with elevation and Esmarch.  Tourniquet inflated to 350 mmHg.  Previous incision over the distal fibula utilized exposing the implanted plate and nonunion.  The plate removed without difficulty.  Obvious gross motion nonunion and bony defect over a distance of 1 cm to 1.5 cm in the fibular fracture.  All necrotic debris completely curetted out as well as fibrinous interposing material.  I exposed bleeding cancellous bone and re-established the intramedullary canal proximally and distally.  An incision was then made iliac crest posterior to the anterior superior iliac spine.  The skin and subcutaneous tissues were divided.  Subperiosteal exposure of the crest itself.  A corticocancellous strip was harvested off the lateral aspect of the crest to fill the defect in the fibula.  Cancellous bone was harvested below this.  The wound was irrigated and packed with Gelfoam in the bony defect.  It was then packed with an epinephrine soaked sponge to control bleeding.  It was irrigated.  Once the bleeding was controlled, the sponge removed.  The fascia over the iliac crest was closed with Vicryl and the subcuticular and subcutaneous closure with Vicryl and Steri-Strips.  Margins of wound injected with Marcaine.  Sterile compressive dressing applied on this wound ____________ . Graft which was harvested was then packed into the bony defect in the fibula.  I utilized the same drill holes but extended these proximally and distally into an 8-hole plate.  I went to a titanium plate with 4-0 screws which allowed use of the previous screw holes and gave me excellent capturing and fixation well above and well below the site of the nonunion. The corticocancellous strut nicely filled the defect and was further packed around this with cancellous bone.  I was able to fill the defect  nicely with graft and obtain good fixation proximally and distally.  Also the slight malposition of the fibula because of bone loss was able to be corrected to a nicely anatomic position both at the fracture and at the mortise at completion.  The wound was irrigated.  The screws in the plate distally were 4.0 cancellous screws which gave me firm fixation in the previous drill holes which were ovedrilled to allow for the 4.0  screws.  After the wound was irrigated, the fascia was closed with Vicryl, the skin and subcutaneous tissue with Vicryl.  Margins of the wound injected with Marcaine without epinephrine.  Steri-Strips applied.  Sterile compressive dressing applied over the hip and over the ankle.  Cam walker applied to the ankle.  Tourniquet inflated and removed.  Anesthesia reversed.  Brought to recovery room. Tolerated surgery well.  No complications. DD:  09/16/99 TD:  09/17/99 Job: [id number]

## 2010-06-24 ENCOUNTER — Inpatient Hospital Stay (HOSPITAL_COMMUNITY)
Admission: RE | Admit: 2010-06-24 | Discharge: 2010-06-24 | Disposition: A | Discharge status: Home / Self Care | Payer: Medicare Other | Source: Ambulatory Visit | Attending: Family Medicine | Admitting: Family Medicine

## 2010-06-30 ENCOUNTER — Encounter (HOSPITAL_BASED_OUTPATIENT_CLINIC_OR_DEPARTMENT_OTHER): Payer: Medicare Other | Admitting: Oncology

## 2010-06-30 ENCOUNTER — Other Ambulatory Visit: Payer: Self-pay | Admitting: Oncology

## 2010-06-30 DIAGNOSIS — D0590 Unspecified type of carcinoma in situ of unspecified breast: Secondary | ICD-10-CM

## 2010-06-30 DIAGNOSIS — Z170 Estrogen receptor positive status [ER+]: Secondary | ICD-10-CM

## 2010-06-30 DIAGNOSIS — C50119 Malignant neoplasm of central portion of unspecified female breast: Secondary | ICD-10-CM

## 2010-06-30 LAB — COMPREHENSIVE METABOLIC PANEL
ALT: 17 U/L (ref 0–35)
AST: 25 U/L (ref 0–37)
Albumin: 4.3 g/dL (ref 3.5–5.2)
BUN: 14 mg/dL (ref 6–23)
Calcium: 9.3 mg/dL (ref 8.4–10.5)
Chloride: 100 mEq/L (ref 96–112)
Potassium: 3.8 mEq/L (ref 3.5–5.3)

## 2010-06-30 LAB — CBC WITH DIFFERENTIAL/PLATELET
BASO%: 0.6 % (ref 0.0–2.0)
EOS%: 1.3 % (ref 0.0–7.0)
HCT: 33.3 % — ABNORMAL LOW (ref 34.8–46.6)
LYMPH%: 27.7 % (ref 14.0–49.7)
MCH: 31.6 pg (ref 25.1–34.0)
MCHC: 34.7 g/dL (ref 31.5–36.0)
MONO%: 7.2 % (ref 0.0–14.0)
NEUT%: 63.2 % (ref 38.4–76.8)
lymph#: 1.7 10*3/uL (ref 0.9–3.3)

## 2010-07-06 ENCOUNTER — Encounter: Payer: Medicare Other | Admitting: Oncology

## 2010-07-06 ENCOUNTER — Other Ambulatory Visit: Payer: Self-pay | Admitting: Oncology

## 2010-07-06 DIAGNOSIS — Z9889 Other specified postprocedural states: Secondary | ICD-10-CM

## 2010-08-04 ENCOUNTER — Other Ambulatory Visit: Payer: Self-pay | Admitting: Gastroenterology

## 2010-09-24 ENCOUNTER — Ambulatory Visit
Admission: RE | Admit: 2010-09-24 | Discharge: 2010-09-24 | Disposition: A | Discharge status: Home / Self Care | Payer: Medicare Other | Source: Ambulatory Visit | Attending: Radiation Oncology | Admitting: Radiation Oncology

## 2010-10-19 LAB — CBC
HCT: 44.8
HCT: 33.4 — ABNORMAL LOW
Hemoglobin: 15
Hemoglobin: 11.7 — ABNORMAL LOW
MCHC: 34.7
MCHC: 34.9
MCHC: 35.2
MCV: 92.4
MCV: 92.3
Platelets: 223
Platelets: 213
RBC: 4.55
RDW: 13.2
RDW: 12.8
WBC: 7.7
WBC: 9

## 2010-10-19 LAB — URINALYSIS, ROUTINE W REFLEX MICROSCOPIC
Bilirubin Urine: NEGATIVE
Bilirubin Urine: NEGATIVE
Ketones, ur: NEGATIVE
Ketones, ur: NEGATIVE
Nitrite: NEGATIVE
Protein, ur: NEGATIVE
Protein, ur: NEGATIVE
Specific Gravity, Urine: 1.009
Urobilinogen, UA: 0.2
Urobilinogen, UA: 0.2

## 2010-10-19 LAB — DIFFERENTIAL
Basophils Relative: 0
Basophils Relative: 1
Eosinophils Absolute: 0.2
Eosinophils Relative: 2
Lymphs Abs: 0.9
Monocytes Absolute: 0.8
Monocytes Relative: 10
Neutro Abs: 5.9
Neutrophils Relative %: 62
Neutrophils Relative %: 77

## 2010-10-19 LAB — URINE MICROSCOPIC-ADD ON

## 2010-10-19 LAB — BASIC METABOLIC PANEL
BUN: 7
CO2: 28
Chloride: 99
Creatinine, Ser: 0.7
Glucose, Bld: 91

## 2010-10-19 LAB — COMPREHENSIVE METABOLIC PANEL
Albumin: 2.8 — ABNORMAL LOW
Alkaline Phosphatase: 69
BUN: 5 — ABNORMAL LOW
Chloride: 102
Creatinine, Ser: 0.81
Glucose, Bld: 79
Total Bilirubin: 0.6
Total Protein: 5.9 — ABNORMAL LOW

## 2010-10-19 LAB — CULTURE, BLOOD (ROUTINE X 2)
Culture: NO GROWTH
Culture: NO GROWTH

## 2010-10-19 LAB — POCT I-STAT, CHEM 8
BUN: 7
Calcium, Ion: 1.16
Chloride: 101
Creatinine, Ser: 0.9
Glucose, Bld: 95
Potassium: 4.2

## 2010-10-19 LAB — WOUND CULTURE

## 2010-10-19 LAB — RPR: RPR Ser Ql: NONREACTIVE

## 2010-11-03 ENCOUNTER — Encounter (HOSPITAL_BASED_OUTPATIENT_CLINIC_OR_DEPARTMENT_OTHER)
Admission: RE | Admit: 2010-11-03 | Discharge: 2010-11-03 | Disposition: A | Discharge status: Home / Self Care | Payer: Medicare Other | Source: Ambulatory Visit | Attending: Orthopedic Surgery | Admitting: Orthopedic Surgery

## 2010-11-03 LAB — BASIC METABOLIC PANEL
Chloride: 102 mEq/L (ref 96–112)
GFR calc Af Amer: >90 mL/min (ref >90)
GFR calc non Af Amer: >90 mL/min (ref >90)
Glucose, Bld: 95 mg/dL (ref 70–99)
Potassium: 4.5 mEq/L (ref 3.5–5.1)
Sodium: 139 mEq/L (ref 135–145)

## 2010-11-05 ENCOUNTER — Ambulatory Visit (HOSPITAL_BASED_OUTPATIENT_CLINIC_OR_DEPARTMENT_OTHER)
Admission: RE | Admit: 2010-11-05 | Discharge: 2010-11-05 | Disposition: A | Discharge status: Home / Self Care | Payer: Medicare Other | Source: Ambulatory Visit | Attending: Orthopedic Surgery | Admitting: Orthopedic Surgery

## 2010-11-05 DIAGNOSIS — Z01812 Encounter for preprocedural laboratory examination: Secondary | ICD-10-CM | POA: Insufficient documentation

## 2010-11-05 DIAGNOSIS — M722 Plantar fascial fibromatosis: Secondary | ICD-10-CM | POA: Insufficient documentation

## 2010-11-06 LAB — POCT HEMOGLOBIN-HEMACUE: Hemoglobin: 13.5 g/dL (ref 12.0–15.0)

## 2010-11-11 NOTE — Op Note (Signed)
  NAMESHEROL, SABAS NO.:  [id number]  MEDICAL RECORD NO.:  [id number]  LOCATION:                                 FACILITY:  PHYSICIAN:  Loreta Ave, M.D. DATE OF BIRTH:  28-Feb-1960  DATE OF PROCEDURE:  11/05/2010 DATE OF DISCHARGE:                              OPERATIVE REPORT   PREOPERATIVE DIAGNOSIS:  Chronic recalcitrant plantar fasciitis, left heel.  POSTOPERATIVE DIAGNOSIS:  Chronic recalcitrant plantar fasciitis, left heel.  PROCEDURE:  Endoscopic plantar fascia release, left heel.  SURGEON:  Loreta Ave, MD  ASSISTANT:  Zonia Kief, PA.  ANESTHESIA:  General.  BLOOD LOSS:  Minimal.  SPECIMENS:  None.  CULTURES:  None.  COMPLICATION:  None.  DRESSINGS:  Soft compressive.  TOURNIQUET TIME:  25 minutes.  PROCEDURE:  The patient was brought to operating room, placed on the operating table in supine position.  After adequate anesthesia had been obtained, tourniquet applied, prepped and draped in usual sterile fashion.  Exsanguinated with elevation Esmarch, tourniquet inflated to 250 mmHg.  With fluoroscopic guidance, the attachment of the plantar fascia and os calcis identified.  Small incision made on either side. The plantar side of the plantar fascia cleared with a spatula device. Cannula was then placed.  Again fluoroscopy used to confirm good position.  Under direct endoscopic visualization, plantar fascia divided from medial to lateral side.  Once this was confirmed, the wound irrigated.  Cannula removed and wounds closed. Sterile compressive dressing applied.  Tourniquet inflated and removed. Anesthesia reversed.  Brought to recovery room.  Tolerated surgery well. No complications.     Loreta Ave, M.D.     DFM/MEDQ  D:  11/05/2010  T:  11/05/2010  Job:  [id number]  Electronically Signed by Mckinley Jewel M.D. on 11/11/2010 02:04:25 PM

## 2010-11-19 ENCOUNTER — Ambulatory Visit (HOSPITAL_COMMUNITY)
Admission: RE | Admit: 2010-11-19 | Discharge: 2010-11-19 | Disposition: A | Discharge status: Home / Self Care | Payer: Medicare Other | Source: Ambulatory Visit | Attending: Gastroenterology | Admitting: Gastroenterology

## 2010-11-19 ENCOUNTER — Other Ambulatory Visit: Payer: Self-pay | Admitting: Gastroenterology

## 2010-11-19 DIAGNOSIS — Z860100 Personal history of colon polyps, unspecified: Secondary | ICD-10-CM | POA: Insufficient documentation

## 2010-11-19 DIAGNOSIS — IMO0001 Reserved for inherently not codable concepts without codable children: Secondary | ICD-10-CM | POA: Insufficient documentation

## 2010-11-19 DIAGNOSIS — K620 Anal polyp: Secondary | ICD-10-CM | POA: Insufficient documentation

## 2010-11-19 DIAGNOSIS — I10 Essential (primary) hypertension: Secondary | ICD-10-CM | POA: Insufficient documentation

## 2010-11-20 NOTE — Op Note (Signed)
  NAMEKANIKA, BUNGERT NO.:  [id number]  MEDICAL RECORD NO.:  [id number]  LOCATION:  [facility]                         FACILITY:  [facility]  PHYSICIAN:  Danise Edge, M.D.   DATE OF BIRTH:  12-13-1960  DATE OF PROCEDURE:  11/19/2010 DATE OF DISCHARGE:                              OPERATIVE REPORT   PROCEDURE:  Flexible proctosigmoidoscopy report.  HISTORY:  Ms. Anita Arroyo is a 50 year old female born on January 11, 1961.  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  A followup flexible proctosigmoidoscopy is performed today to ensure complete removal of the rectal polyp.  ENDOSCOPIST:  Danise Edge, M.D.  PREMEDICATION:  Fentanyl 100 mcg, Versed 7 mg.  PROCEDURE:  The patient was placed in the left lateral decubitus position.  Anal inspection and digital rectal exam were normal.  The Pentax pediatric colonoscope was introduced into the rectum and advanced to approximately 50 cm from the anal verge.  Colonic preparation for the flexible proctosigmoidoscopy was satisfactory.  The polypectomy site was identified in the mid rectum.  There was a 3 mm whitish polyp that was removed with cold biopsy forceps.  Otherwise there is no evidence of residual polyp at the polypectomy site in the rectum.  Endoscopic appearance of the sigmoid colon with the endoscope advanced to 50 cm from the anal verge was normal.  RECOMMENDATIONS:  Schedule surveillance colonoscopy in 3 years.          ______________________________ Danise Edge, M.D.     MJ/MEDQ  D:  11/19/2010  T:  11/19/2010  Job:  [id number]  Electronically Signed by Danise Edge M.D. on 11/20/2010 04:42:52 AM

## 2010-12-15 ENCOUNTER — Ambulatory Visit (INDEPENDENT_AMBULATORY_CARE_PROVIDER_SITE_OTHER): Payer: Self-pay | Admitting: General Surgery

## 2011-01-04 ENCOUNTER — Ambulatory Visit (INDEPENDENT_AMBULATORY_CARE_PROVIDER_SITE_OTHER): Payer: Self-pay | Admitting: General Surgery

## 2011-03-05 ENCOUNTER — Ambulatory Visit: Payer: Medicare Other

## 2011-03-05 NOTE — Progress Notes (Unsigned)
Pt cancelled appointment.  Will reschedule to have blood drawn for Cancer Next

## 2011-04-15 ENCOUNTER — Telehealth: Payer: Self-pay | Admitting: Oncology

## 2011-04-15 NOTE — Telephone Encounter (Signed)
S/w pt today re new genetics appt for 3/25 @ 12:30 pm.

## 2011-04-19 ENCOUNTER — Encounter: Payer: Medicare Other | Admitting: Genetic Counselor

## 2011-04-19 ENCOUNTER — Other Ambulatory Visit: Payer: Medicare Other | Admitting: Lab

## 2011-04-28 DIAGNOSIS — Z9289 Personal history of other medical treatment: Secondary | ICD-10-CM

## 2011-04-28 HISTORY — DX: Personal history of other medical treatment: Z92.89

## 2011-05-28 ENCOUNTER — Other Ambulatory Visit: Payer: Self-pay | Admitting: Orthopedic Surgery

## 2011-05-28 DIAGNOSIS — M25512 Pain in left shoulder: Secondary | ICD-10-CM

## 2011-06-04 ENCOUNTER — Ambulatory Visit
Admission: RE | Admit: 2011-06-04 | Discharge: 2011-06-04 | Disposition: A | Discharge status: Home / Self Care | Payer: Medicare Other | Source: Ambulatory Visit | Attending: Orthopedic Surgery | Admitting: Orthopedic Surgery

## 2011-06-04 DIAGNOSIS — M25512 Pain in left shoulder: Secondary | ICD-10-CM

## 2011-06-26 ENCOUNTER — Other Ambulatory Visit: Payer: Self-pay | Admitting: Oncology

## 2011-06-26 DIAGNOSIS — C50919 Malignant neoplasm of unspecified site of unspecified female breast: Secondary | ICD-10-CM

## 2011-06-28 ENCOUNTER — Other Ambulatory Visit: Payer: Medicare Other

## 2011-06-29 ENCOUNTER — Other Ambulatory Visit: Payer: Medicare Other | Admitting: Lab

## 2011-06-29 ENCOUNTER — Ambulatory Visit (HOSPITAL_BASED_OUTPATIENT_CLINIC_OR_DEPARTMENT_OTHER): Payer: Medicare Other

## 2011-06-29 DIAGNOSIS — C50919 Malignant neoplasm of unspecified site of unspecified female breast: Secondary | ICD-10-CM

## 2011-06-29 LAB — FOLLICLE STIMULATING HORMONE: FSH: 15 m[IU]/mL

## 2011-07-06 ENCOUNTER — Ambulatory Visit (HOSPITAL_BASED_OUTPATIENT_CLINIC_OR_DEPARTMENT_OTHER): Payer: Medicare Other | Admitting: Oncology

## 2011-07-06 VITALS — BP 107/65 | HR 77 | Temp 98.6°F | Ht 62.5 in | Wt 171.7 lb

## 2011-07-06 DIAGNOSIS — C50919 Malignant neoplasm of unspecified site of unspecified female breast: Secondary | ICD-10-CM

## 2011-07-06 DIAGNOSIS — E559 Vitamin D deficiency, unspecified: Secondary | ICD-10-CM

## 2011-07-06 NOTE — Progress Notes (Signed)
Hematology and Oncology Follow Up Visit  Anita Arroyo [id number] January 29, 1960 51 y.o. 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  . Breast cancer Yes  . Unspecified vitamin D deficiency      PAST THERAPY: As above   Interim History:  Patient returns for followup. She has done reasonably well. She is on the same medications as before. She is working part-time as a Airline pilot , she has had some fairly irregular and heavy periods. The last one was in December which was quite heavy and she has not had one still recently. She has not seen a gynecologist in well over a year. She has some financial concerns. 5 neck she seems to be doing pretty well. She has had. All along prior to starting tamoxifen.  Medications: I have reviewed the patient's current medications.  Allergies:  Allergies  Allergen Reactions  . Amoxicillin-Pot Clavulanate   . Iodine   . Latex   . Moxifloxacin   . Sulfonamide Derivatives     Past Medical History, Surgical history, Social history, and Family History were reviewed and updated.  Review of Systems: Constitutional:  Negative for fever, chills, night sweats, anorexia, weight loss, pain. Cardiovascular: negative Respiratory: negative Neurological: negative Dermatological: negative ENT: negative Skin Gastrointestinal: negative Genito-Urinary: negative Hematological and Lymphatic: negative Breast: negative Musculoskeletal: negative Remaining ROS negative.  Physical Exam:  Blood pressure 107/65, pulse 77, temperature 98.6 F (37 C), height 5' 2.5" (1.588 m), weight 171 lb 11.2 oz (77.883 kg).  ECOG: 0   General appearance: alert, cooperative and appears stated age Eyes: conjunctivae/corneas clear. PERRL, EOM's intact. Fundi benign. Throat: lips, mucosa, and tongue normal; teeth and gums normal Resp: clear  to auscultation bilaterally and normal percussion bilaterally Breasts: normal appearance, no masses or tenderness, Inspection negative Cardio: regular rate and rhythm, S1, S2 normal, no murmur, click, rub or gallop and normal apical impulse GI: soft, non-tender; bowel sounds normal; no masses,  no organomegaly Extremities: extremities normal, atraumatic, no cyanosis or edema Pulses: 2+ and symmetric Skin: Skin color, texture, turgor normal. No rashes or lesions Lymph nodes: Cervical, supraclavicular, and axillary nodes normal.   Lab Results: Lab Results  Component Value Date   WBC 6.1 06/30/2010   HGB 13.5 11/05/2010   HCT 33.3* 06/30/2010   MCV 90.9 06/30/2010   PLT 225 06/30/2010     Chemistry      Component Value Date/Time   NA 139 11/03/2010 1400   K 4.5 11/03/2010 1400   CL 102 11/03/2010 1400   CO2 24 11/03/2010 1400   BUN 9 11/03/2010 1400   CREATININE 0.73 11/03/2010 1400      Component Value Date/Time   CALCIUM 9.6 11/03/2010 1400   ALKPHOS 65 06/30/2010 1338   ALKPHOS 65 06/30/2010 1338   ALKPHOS 65 06/30/2010 1338   AST 25 06/30/2010 1338   AST 25 06/30/2010 1338   AST 25 06/30/2010 1338   ALT 17 06/30/2010 1338   ALT 17 06/30/2010 1338   ALT 17 06/30/2010 1338   BILITOT 0.3 06/30/2010 1338   BILITOT 0.3 06/30/2010 1338   BILITOT 0.3 06/30/2010 1338       Radiological Studies:  No results found.   IMPRESSIONS AND PLAN: A 51 y.o. female with   History of DCIS with recent onset of irregular and heavy periods. I've encouraged her to go see her gynecologist. A we'll  continue tamoxifen. She has not had a mammogram as scheduled this. I will plan to see her in 6 months time. I did check her FSH level which was in the low range and so she is likely still premenopausal. She will contact us next week to discuss her estradiol level.  Spent more than half the time coordinating care.    Satya Bohall 6/11/20131:54 PM

## 2011-07-07 LAB — ESTRADIOL, ULTRA SENS: Estradiol, Ultra Sensitive: 4 pg/mL

## 2011-07-13 ENCOUNTER — Telehealth: Payer: Self-pay | Admitting: *Deleted

## 2011-07-14 NOTE — Telephone Encounter (Signed)
Patient called to request results of recent labs, note to dr rubin's desk

## 2011-08-14 ENCOUNTER — Emergency Department (HOSPITAL_COMMUNITY): Payer: Medicare Other

## 2011-08-14 ENCOUNTER — Emergency Department (HOSPITAL_COMMUNITY)
Admission: EM | Admit: 2011-08-14 | Discharge: 2011-08-15 | Disposition: A | Discharge status: Home / Self Care | Payer: Medicare Other | Attending: Emergency Medicine | Admitting: Emergency Medicine

## 2011-08-14 ENCOUNTER — Encounter (HOSPITAL_COMMUNITY): Payer: Self-pay | Admitting: Emergency Medicine

## 2011-08-14 DIAGNOSIS — M129 Arthropathy, unspecified: Secondary | ICD-10-CM | POA: Insufficient documentation

## 2011-08-14 DIAGNOSIS — W268XXA Contact with other sharp object(s), not elsewhere classified, initial encounter: Secondary | ICD-10-CM | POA: Insufficient documentation

## 2011-08-14 DIAGNOSIS — F909 Attention-deficit hyperactivity disorder, unspecified type: Secondary | ICD-10-CM | POA: Insufficient documentation

## 2011-08-14 DIAGNOSIS — S90859A Superficial foreign body, unspecified foot, initial encounter: Secondary | ICD-10-CM

## 2011-08-14 DIAGNOSIS — IMO0002 Reserved for concepts with insufficient information to code with codable children: Secondary | ICD-10-CM | POA: Insufficient documentation

## 2011-08-14 DIAGNOSIS — F319 Bipolar disorder, unspecified: Secondary | ICD-10-CM | POA: Insufficient documentation

## 2011-08-14 DIAGNOSIS — Z87891 Personal history of nicotine dependence: Secondary | ICD-10-CM | POA: Insufficient documentation

## 2011-08-14 MED ORDER — LIDOCAINE HCL 2 % IJ SOLN
10.0000 mL | Freq: Once | INTRAMUSCULAR | Status: AC
  Administered 2011-08-15: 200 mg via INTRADERMAL
  Filled 2011-08-14: qty 1

## 2011-08-14 NOTE — ED Notes (Signed)
Pt alert, nad, ambulates to triage, c/o ? FB in left heal, onset a few weeks ago, resp even unlabored, skin pwd

## 2011-08-15 MED ORDER — HYDROCODONE-ACETAMINOPHEN 5-500 MG PO TABS: 1.0000 | ORAL_TABLET | Freq: Four times a day (QID) | ORAL | Status: AC | PRN

## 2011-08-15 MED ORDER — IBUPROFEN 600 MG PO TABS: 600.0000 mg | ORAL_TABLET | Freq: Four times a day (QID) | ORAL | Status: AC | PRN

## 2011-08-15 NOTE — Discharge Instructions (Signed)
Ms Steenson we cut over the foreign body and flushed many times.  I did not see anything come out but now it has a path to come out.  Good luck.  Follow up with your pcp next week.   Foreign Body A foreign body is something in your body that should not be there. This may have been caused by a puncture wound or other injury. Puncture wounds become easily infected. This happens when bacteria (germs) get under the skin. Rusty nails and similar foreign bodies are often dirty and carry germs on them.  TREATMENT   A foreign body is usually removed if this can be easily done right after it happens.   Sometimes they are left in and removed at a later surgery. They may be left in indefinitely if they will not cause later problems.   The following are general instructions in caring for your wound.  HOME CARE INSTRUCTIONS   A dressing, depending on the location of the wound, may have been applied. This may be changed once per day or as instructed. If the dressing sticks, it may be soaked off with soapy water or hydrogen peroxide.   Only take over-the-counter or prescription medicines for pain, discomfort, or fever as directed by your caregiver.   Be aware that your body will work to remove the foreign substance. That is, the foreign body may work itself out of the wound. That is normal.   You may have received a recommendation to follow up with your physician or a specialist. It is very important to call for or keep follow-up appointments in order to avoid infection or other complications.  SEEK IMMEDIATE MEDICAL CARE IF:   There is redness, swelling, or increasing pain in the wound.   You notice a foul smell coming from the wound or dressing.   Pus is coming from the wound.   An unexplained oral temperature above 102 F (38.9 C) develops, or as your caregiver suggests.   There is increasing pain in the wound.  If you did not receive a tetanus shot today because you did not recall when your last one  was given, check with your caregiver's office and determine if one is needed. Generally for a "dirty" wound, you should receive a tetanus booster if you have not had one in the last five years. If you have a "clean" wound, you should receive a tetanus booster if you have not had one within the last ten years. If you have a foreign body that needs removal and this was not done today, make sure you know how you are to follow up and what is the plan of action for taking care of this. It is your responsibility to follow up on this. MAKE SURE YOU:   Understand these instructions.   Will watch your condition.   Will get help right away if you are not doing well or get worse.  Document Released: 07/07/2000 Document Revised: 12/31/2010 Document Reviewed: 08/31/2007 [facility] Patient Information 2012 [place], [place].

## 2011-08-15 NOTE — ED Notes (Signed)
Pt standing in hall refuses to return to room after multiple requests and assurances of assistance. Pt yelling and cursing. Security called. Charge nurse at bedside

## 2011-08-18 NOTE — ED Provider Notes (Signed)
History     CSN: [id number]  Arrival date & time 08/14/11  1478   First MD Initiated Contact with Patient 08/14/11 2327      Chief Complaint  Patient presents with  . Foreign Body in Skin    (Consider location/radiation/quality/duration/timing/severity/associated sxs/prior treatment) The history is provided by the patient. No language interpreter was used.  cc:  51 yo female c/o of splinter in her l heel x severeal days.  No erythema fever n/v.  Tenderness to the site.  Past Medical History  Diagnosis Date  . Heart murmur   . ADHD (attention deficit hyperactivity disorder)   . Bipolar 1 disorder   . Neuromuscular disorder     fibromyalgia  . Arthritis     Past Surgical History  Procedure Date  . Tonsillectomy   . Deviated septum   . Ectopic pregnancy surgery   . Tmj arthroplasty   . Uterine fibroid surgery     polups and fibroids removed   . Elbow surgery     right elbow  . Wrist surgery     left  . Ankle surgery     left x4  . Knee surgery     left knee  . Fibroid     2-3 fibroid adenomas removed  . Breast surgery 2011    lumpectomy right breast   . Joint replacement 2011    right knee     No family history on file.  History  Substance Use Topics  . Smoking status: Former Smoker -- 1.5 packs/day for 30 years    Types: Cigarettes  . Smokeless tobacco: Never Used  . Alcohol Use: Not on file    OB History    Grav Para Term Preterm Abortions TAB SAB Ect Mult Living                  Review of Systems  Constitutional: Negative.   HENT: Negative.   Eyes: Negative.   Respiratory: Negative.   Cardiovascular: Negative.   Gastrointestinal: Negative.   Skin:       L heel splinter  Neurological: Negative.   Psychiatric/Behavioral: Negative.   All other systems reviewed and are negative.    Allergies  Amoxicillin-pot clavulanate; Iodine; Latex; Moxifloxacin; and Sulfonamide derivatives  Home Medications   Current Outpatient Rx  Name Route  Sig Dispense Refill  . ATENOLOL 50 MG PO TABS Oral Take 50 mg by mouth daily.      . AZELASTINE HCL 0.15 % NA SOLN Nasal by Nasal route as directed.      Marland Kitchen CETIRIZINE HCL 10 MG PO TABS Oral Take by mouth as directed.      . NEOSALUS CP EX CREA Apply externally Apply 1 application topically daily as needed.    Marland Kitchen EZETIMIBE 10 MG PO TABS Oral Take 10 mg by mouth daily.    Marland Kitchen FLUTICASONE PROPIONATE 50 MCG/ACT NA SUSP Nasal by Nasal route as directed.      Marland Kitchen GABAPENTIN 100 MG PO CAPS Oral Take 100 mg by mouth 4 (four) times daily.    Marland Kitchen HYDROCODONE-ACETAMINOPHEN 5-500 MG PO CAPS Oral Take 1 capsule by mouth every 6 (six) hours as needed.    Marland Kitchen LAMOTRIGINE 150 MG PO TABS Oral Take 150 mg by mouth daily.     Marland Kitchen MELATONIN 1 MG PO TABS Oral Take 1 tablet by mouth every evening.    . METHYLPHENIDATE HCL 20 MG PO TABS Oral Take 10 mg by mouth 4 (four) times daily -  after meals and at bedtime.     . OXYCODONE-ACETAMINOPHEN 5-325 MG PO TABS Oral Take 1 tablet by mouth every 4 (four) hours as needed. Pain    . TAMOXIFEN CITRATE 20 MG PO TABS Oral Take 20 mg by mouth daily.     . TRAZODONE HCL 100 MG PO TABS Oral Take 100 mg by mouth as directed.      . TRIAMTERENE-HCTZ 37.5-25 MG PO CAPS Oral Take by mouth as directed.      Marland Kitchen VARENICLINE TARTRATE 0.5 MG PO TABS Oral Take 0.5 mg by mouth 2 (two) times daily.    . VENLAFAXINE HCL ER 150 MG PO CP24 Oral Take 150 mg by mouth as directed.     Marland Kitchen HYDROCODONE-ACETAMINOPHEN 5-500 MG PO TABS Oral Take 1-2 tablets by mouth every 6 (six) hours as needed for pain. 10 tablet 0  . IBUPROFEN 600 MG PO TABS Oral Take 1 tablet (600 mg total) by mouth every 6 (six) hours as needed for pain. 30 tablet 0    BP 124/52  Pulse 66  Temp 98.9 F (37.2 C) (Oral)  Resp 22  SpO2 98%  LMP 05/15/2011  Physical Exam  Nursing note and vitals reviewed. Constitutional: She is oriented to person, place, and time. She appears well-developed and well-nourished.  HENT:  Head:  Normocephalic and atraumatic.  Eyes: Conjunctivae and EOM are normal. Pupils are equal, round, and reactive to light.  Neck: Normal range of motion. Neck supple.  Cardiovascular: Normal rate.   Pulmonary/Chest: Effort normal.  Abdominal: Soft.  Musculoskeletal: Normal range of motion. She exhibits no edema and no tenderness.  Neurological: She is alert and oriented to person, place, and time. She has normal reflexes.  Skin: Skin is warm and dry.       Splinter to L heel no drainage or erythema  Psychiatric: She has a normal mood and affect.    ED Course  FOREIGN BODY REMOVAL Date/Time: 08/15/2011 12:41 AM Performed by: Remi Haggard Authorized by: Remi Haggard Consent: Verbal consent obtained. Written consent not obtained. Risks and benefits: risks, benefits and alternatives were discussed Consent given by: patient Patient understanding: patient states understanding of the procedure being performed Patient identity confirmed: verbally with patient, arm band, provided demographic data and hospital-assigned identification number Time out: Immediately prior to procedure a "time out" was called to verify the correct patient, procedure, equipment, support staff and site/side marked as required. Intake: L heel. Anesthesia: local infiltration Local anesthetic: lidocaine 2% without epinephrine Anesthetic total: 3 ml Patient sedated: no Patient restrained: no Patient cooperative: yes Complexity: simple 1 objects recovered. Objects recovered: splinter Post-procedure assessment: residual foreign bodies remain Patient tolerance: Patient tolerated the procedure well with no immediate complications. Comments: ? Partial metalic foreign body flushed from L heel. Very small linear 4mm on x-ray.   (including critical care time)  Labs Reviewed - No data to display No results found.   1. Splinter of foot       MDM  Foreign body removal in ER.  Return if worse.  PCP of choice to follow  up with.        Remi Haggard, NP 08/18/11 1345

## 2011-08-18 NOTE — ED Provider Notes (Signed)
Medical screening examination/treatment/procedure(s) were performed by non-physician practitioner and as supervising physician I was immediately available for consultation/collaboration.   Hanley Seamen, MD 08/18/11 2306

## 2011-09-03 ENCOUNTER — Telehealth: Payer: Self-pay | Admitting: *Deleted

## 2011-09-03 NOTE — Telephone Encounter (Signed)
patient confirmed over the phone the new date and time at the breast center arrival time is 9:40am

## 2011-09-16 ENCOUNTER — Ambulatory Visit
Admission: RE | Admit: 2011-09-16 | Discharge: 2011-09-16 | Disposition: A | Discharge status: Home / Self Care | Payer: Medicare Other | Source: Ambulatory Visit | Attending: Oncology | Admitting: Oncology

## 2011-09-16 DIAGNOSIS — C50919 Malignant neoplasm of unspecified site of unspecified female breast: Secondary | ICD-10-CM

## 2011-09-16 DIAGNOSIS — E559 Vitamin D deficiency, unspecified: Secondary | ICD-10-CM

## 2011-09-30 ENCOUNTER — Ambulatory Visit (HOSPITAL_COMMUNITY)
Admission: RE | Admit: 2011-09-30 | Discharge: 2011-09-30 | Disposition: A | Discharge status: Home / Self Care | Payer: Medicare Other | Source: Ambulatory Visit | Attending: Oncology | Admitting: Oncology

## 2011-09-30 DIAGNOSIS — E559 Vitamin D deficiency, unspecified: Secondary | ICD-10-CM

## 2011-09-30 DIAGNOSIS — R922 Inconclusive mammogram: Secondary | ICD-10-CM | POA: Insufficient documentation

## 2011-09-30 DIAGNOSIS — Z853 Personal history of malignant neoplasm of breast: Secondary | ICD-10-CM | POA: Insufficient documentation

## 2011-09-30 DIAGNOSIS — C50919 Malignant neoplasm of unspecified site of unspecified female breast: Secondary | ICD-10-CM

## 2011-09-30 MED ORDER — GADOBENATE DIMEGLUMINE 529 MG/ML IV SOLN
17.0000 mL | Freq: Once | INTRAVENOUS | Status: AC | PRN
  Administered 2011-09-30: 17 mL via INTRAVENOUS

## 2011-10-11 ENCOUNTER — Encounter (HOSPITAL_BASED_OUTPATIENT_CLINIC_OR_DEPARTMENT_OTHER): Payer: Self-pay | Admitting: *Deleted

## 2011-10-11 NOTE — Progress Notes (Signed)
To come in for bmet-has stayed her for other surgeries-wants to stay-bring all meds, overnight bag Sees dr Tresa Endo for palpitations-saw him 09/30/11 for ekg and clearance

## 2011-10-12 ENCOUNTER — Encounter (HOSPITAL_BASED_OUTPATIENT_CLINIC_OR_DEPARTMENT_OTHER)
Admission: RE | Admit: 2011-10-12 | Discharge: 2011-10-12 | Disposition: A | Discharge status: Home / Self Care | Payer: Medicare Other | Source: Ambulatory Visit | Attending: Orthopedic Surgery | Admitting: Orthopedic Surgery

## 2011-10-12 LAB — BASIC METABOLIC PANEL
BUN: 14 mg/dL (ref 6–23)
CO2: 29 mEq/L (ref 19–32)
Chloride: 102 mEq/L (ref 96–112)
Creatinine, Ser: 0.87 mg/dL (ref 0.50–1.10)
Glucose, Bld: 95 mg/dL (ref 70–99)

## 2011-10-13 NOTE — H&P (Signed)
Robb Sibal/[facility] [address]   [address] [place], [place] [address] [phone] A Division of [facility]  Loreta Ave, M.D.     Robert A. Thurston Hole, M.D.     Lunette Stands, M.D. Eulas Post, M.D.    Buford Dresser, M.D. Estell Harpin, M.D. Ralene Cork, D.O.          Genene Churn. Barry Dienes, PA-C            Kirstin A. Shepperson, PA-C [place], OPA-C   RE: Summer, [place]                                [phone]      DOB: October 25, 1960 PROGRESS NOTE: 06-08-11 [age] year-old white female with a history of left shoulder pain.  Returns for review of her MRI scan performed on Jun 04, 2011.  Scan showed cystic change in the anterior humeral head adjacent to the lesser tuberosity and fraying of the articular surface of the subscapularis tendon together suggesting subcoracoid impingement, rotator cuff tendinopathy without tear, new cystic change and bone marrow edema in the 12:00 position of the glenoid, highly suspicious for underlying SLAP tear.  Shoulder symptoms unchanged from previous visit.  She is hoping to be able to avoid shoulder surgery if possible.  Patient also has a known history of bilateral foot issues, as well documented in previous office note.  She did not get the orthotics that were recommended due to cost issues.    DISPOSITION:  For her shoulder we did discuss conservative treatment with or without glenohumeral intraarticular Depo-Medrol/Marcaine injection and she will let us know if she is wanting to try this.  Definitive treatment would be outpatient surgery and this was discussed.  Since there were some issues with the custom orthotic, she was given two steel inserts for her shoes.  She will follow up in the office p.r.n., but will let us know if she is wanting to do something with her shoulder.    Loreta Ave, M.D.  Omere Marti/[facility] [address]   [address] [place],  [place] [address] [phone] A Division of [facility]  Loreta Ave, M.D.     Robert A. Thurston Hole, M.D.     Lunette Stands, M.D. Eulas Post, M.D.    Buford Dresser, M.D. Estell Harpin, M.D. Genene Churn. Barry Dienes, PA-C            Kirstin A. Shepperson, PA-C [place] [facility] [place], [facility]   RE: Tityana, Pagan                                [phone]      DOB: 1960-10-27 PROGRESS NOTE: 08-17-11 Samah comes in for follow up of her left shoulder.  Everything we have done to date has temporarily helped her.  This includes a subacromial injection, as well as intraarticular injection.  Unfortunately nothing has lasted beyond just a couple of days.  She has had previous x-rays from May that show a Type II acromion, reasonable subacromial space and degenerative changes of the [facility] joint.  She has had an MRI scan done also in May that shows some cystic change anterior humeral head and adjacent tuberosity and some fraying of the articular surface subscapularis tendon.  Rotator cuff tendinopathy without tear.  Probable  underlying SLAP tear.  Her symptoms are both subacromial, as well as intermittent mechanical intraarticular symptoms.  Hydrocodone no longer helping.  She feels like she needs to progress with more definitive treatment.  We have pretty much exhausted all approaches at conservative treatment at this time.   More than 25 minutes spent face-to-face reviewing all of her workup, treatment to date and discussing definitive treatment.  She still has reasonable motion and strength.  The next step is exam under anesthesia, arthroscopy and assessment of pathology with debridement.  If there is a significant SLAP tear treatment at her age would be debridement and release of her biceps.  I don't think I would add an extraarticular tenodesis and I have discussed that with her.  Subacromial decompression, distal clavicle excision as well.  Procedures, risks, benefits and complications reviewed in  detail with her.  She is going to try to set it up for later this fall.  In the interim she already has pain medication.  We have done numerous injections and there is really nothing else I have to offer.  I have told her that this can wait from my point of view, but if symptoms are intolerable the sooner she proceeds the better.     Loreta Ave, M.D.

## 2011-10-14 ENCOUNTER — Encounter (HOSPITAL_BASED_OUTPATIENT_CLINIC_OR_DEPARTMENT_OTHER): Payer: Self-pay | Admitting: Certified Registered"

## 2011-10-14 ENCOUNTER — Ambulatory Visit (HOSPITAL_BASED_OUTPATIENT_CLINIC_OR_DEPARTMENT_OTHER)
Admission: RE | Admit: 2011-10-14 | Discharge: 2011-10-14 | Disposition: A | Discharge status: Home / Self Care | Payer: Medicare Other | Source: Ambulatory Visit | Attending: Orthopedic Surgery | Admitting: Orthopedic Surgery

## 2011-10-14 ENCOUNTER — Ambulatory Visit (HOSPITAL_BASED_OUTPATIENT_CLINIC_OR_DEPARTMENT_OTHER): Payer: Medicare Other | Admitting: Certified Registered"

## 2011-10-14 ENCOUNTER — Encounter (HOSPITAL_BASED_OUTPATIENT_CLINIC_OR_DEPARTMENT_OTHER): Payer: Self-pay | Admitting: *Deleted

## 2011-10-14 ENCOUNTER — Encounter (HOSPITAL_BASED_OUTPATIENT_CLINIC_OR_DEPARTMENT_OTHER)
Admission: RE | Disposition: A | Discharge status: Home / Self Care | Payer: Self-pay | Source: Ambulatory Visit | Attending: Orthopedic Surgery

## 2011-10-14 DIAGNOSIS — K219 Gastro-esophageal reflux disease without esophagitis: Secondary | ICD-10-CM | POA: Insufficient documentation

## 2011-10-14 DIAGNOSIS — M24019 Loose body in unspecified shoulder: Secondary | ICD-10-CM | POA: Insufficient documentation

## 2011-10-14 DIAGNOSIS — M25819 Other specified joint disorders, unspecified shoulder: Secondary | ICD-10-CM | POA: Insufficient documentation

## 2011-10-14 DIAGNOSIS — Z01812 Encounter for preprocedural laboratory examination: Secondary | ICD-10-CM | POA: Insufficient documentation

## 2011-10-14 DIAGNOSIS — I10 Essential (primary) hypertension: Secondary | ICD-10-CM | POA: Insufficient documentation

## 2011-10-14 DIAGNOSIS — Z9889 Other specified postprocedural states: Secondary | ICD-10-CM

## 2011-10-14 DIAGNOSIS — M249 Joint derangement, unspecified: Secondary | ICD-10-CM | POA: Insufficient documentation

## 2011-10-14 HISTORY — DX: Gastro-esophageal reflux disease without esophagitis: K21.9

## 2011-10-14 HISTORY — DX: Major depressive disorder, single episode, unspecified: F32.9

## 2011-10-14 HISTORY — DX: Depression, unspecified: F32.A

## 2011-10-14 HISTORY — DX: Cardiac arrhythmia, unspecified: I49.9

## 2011-10-14 HISTORY — DX: Meniere's disease, unspecified ear: H81.09

## 2011-10-14 HISTORY — DX: Fibromyalgia: M79.7

## 2011-10-14 LAB — POCT HEMOGLOBIN-HEMACUE: Hemoglobin: 15.3 g/dL — ABNORMAL HIGH (ref 12.0–15.0)

## 2011-10-14 SURGERY — SHOULDER ARTHROSCOPY WITH SUBACROMIAL DECOMPRESSION
Anesthesia: General | Site: Shoulder | Laterality: Left | Wound class: Clean

## 2011-10-14 MED ORDER — LACTATED RINGERS IV SOLN
INTRAVENOUS | Status: DC
  Administered 2011-10-14 (×2): via INTRAVENOUS

## 2011-10-14 MED ORDER — PHENYLEPHRINE HCL 10 MG/ML IJ SOLN
INTRAMUSCULAR | Status: DC | PRN
  Administered 2011-10-14 (×2): .08 ug via INTRAVENOUS
  Administered 2011-10-14: .2 ug via INTRAVENOUS

## 2011-10-14 MED ORDER — LIDOCAINE HCL (CARDIAC) 20 MG/ML IV SOLN
INTRAVENOUS | Status: DC | PRN
  Administered 2011-10-14: 100 mg via INTRAVENOUS

## 2011-10-14 MED ORDER — MIDAZOLAM HCL 2 MG/2ML IJ SOLN
1.0000 mg | INTRAMUSCULAR | Status: DC | PRN
  Administered 2011-10-14: 2 mg via INTRAVENOUS

## 2011-10-14 MED ORDER — ONDANSETRON HCL 4 MG/2ML IJ SOLN
INTRAMUSCULAR | Status: DC | PRN
  Administered 2011-10-14: 4 mg via INTRAVENOUS

## 2011-10-14 MED ORDER — FENTANYL CITRATE 0.05 MG/ML IJ SOLN
50.0000 ug | INTRAMUSCULAR | Status: DC | PRN
  Administered 2011-10-14: 100 ug via INTRAVENOUS

## 2011-10-14 MED ORDER — DEXAMETHASONE SODIUM PHOSPHATE 4 MG/ML IJ SOLN
INTRAMUSCULAR | Status: DC | PRN
  Administered 2011-10-14: 5 mg via INTRAVENOUS

## 2011-10-14 MED ORDER — VANCOMYCIN HCL IN DEXTROSE 1-5 GM/200ML-% IV SOLN
1000.0000 mg | INTRAVENOUS | Status: AC
  Administered 2011-10-14: 1000 mg via INTRAVENOUS

## 2011-10-14 MED ORDER — FENTANYL CITRATE 0.05 MG/ML IJ SOLN
INTRAMUSCULAR | Status: DC | PRN
  Administered 2011-10-14: 100 ug via INTRAVENOUS

## 2011-10-14 MED ORDER — LACTATED RINGERS IV SOLN: INTRAVENOUS | Status: DC

## 2011-10-14 MED ORDER — BUPIVACAINE-EPINEPHRINE PF 0.5-1:200000 % IJ SOLN
INTRAMUSCULAR | Status: DC | PRN
  Administered 2011-10-14: 25 mL

## 2011-10-14 MED ORDER — SUCCINYLCHOLINE CHLORIDE 20 MG/ML IJ SOLN
INTRAMUSCULAR | Status: DC | PRN
  Administered 2011-10-14: 100 mg via INTRAVENOUS

## 2011-10-14 MED ORDER — EPHEDRINE SULFATE 50 MG/ML IJ SOLN
INTRAMUSCULAR | Status: DC | PRN
  Administered 2011-10-14: 10 mg via INTRAVENOUS
  Administered 2011-10-14: 5 mg via INTRAVENOUS
  Administered 2011-10-14: 10 mg via INTRAVENOUS
  Administered 2011-10-14: 15 mg via INTRAVENOUS

## 2011-10-14 MED ORDER — PROPOFOL 10 MG/ML IV BOLUS
INTRAVENOUS | Status: DC | PRN
  Administered 2011-10-14: 200 mg via INTRAVENOUS
  Administered 2011-10-14: 50 mg via INTRAVENOUS

## 2011-10-14 MED ORDER — SODIUM CHLORIDE 0.9 % IR SOLN
Status: DC | PRN
  Administered 2011-10-14: 15000 mL

## 2011-10-14 SURGICAL SUPPLY — 67 items
APL SKNCLS STERI-STRIP NONHPOA (GAUZE/BANDAGES/DRESSINGS)
BENZOIN TINCTURE PRP APPL 2/3 (GAUZE/BANDAGES/DRESSINGS) IMPLANT
BLADE CUTTER GATOR 3.5 (BLADE) ×3 IMPLANT
BLADE CUTTER MENIS 5.5 (BLADE) IMPLANT
BLADE GREAT WHITE 4.2 (BLADE) ×3 IMPLANT
BLADE SURG 15 STRL LF DISP TIS (BLADE) ×2 IMPLANT
BLADE SURG 15 STRL SS (BLADE) ×3
BUR OVAL 6.0 (BURR) ×3 IMPLANT
CANISTER OMNI JUG 16 LITER (MISCELLANEOUS) ×3 IMPLANT
CANISTER SUCTION 2500CC (MISCELLANEOUS) IMPLANT
CANNULA TWIST IN 8.25X7CM (CANNULA) IMPLANT
CLOTH BEACON ORANGE TIMEOUT ST (SAFETY) ×3 IMPLANT
DECANTER SPIKE VIAL GLASS SM (MISCELLANEOUS) IMPLANT
DRAPE STERI 35X30 U-POUCH (DRAPES) ×3 IMPLANT
DRAPE U-SHAPE 47X51 STRL (DRAPES) ×3 IMPLANT
DRAPE U-SHAPE 76X120 STRL (DRAPES) ×6 IMPLANT
DRSG PAD ABDOMINAL 8X10 ST (GAUZE/BANDAGES/DRESSINGS) ×3 IMPLANT
DURAPREP 26ML APPLICATOR (WOUND CARE) ×3 IMPLANT
ELECT MENISCUS 165MM 90D (ELECTRODE) ×3 IMPLANT
ELECT NDL TIP 2.8 STRL (NEEDLE) IMPLANT
ELECT NEEDLE TIP 2.8 STRL (NEEDLE) IMPLANT
ELECT REM PT RETURN 9FT ADLT (ELECTROSURGICAL) ×3
ELECTRODE REM PT RTRN 9FT ADLT (ELECTROSURGICAL) ×2 IMPLANT
GAUZE XEROFORM 1X8 LF (GAUZE/BANDAGES/DRESSINGS) ×3 IMPLANT
GLOVE BIOGEL PI IND STRL 8 (GLOVE) ×2 IMPLANT
GLOVE BIOGEL PI INDICATOR 8 (GLOVE) ×1
GLOVE ORTHO TXT STRL SZ7.5 (GLOVE) IMPLANT
GLOVE SKINSENSE NS SZ7.5 (GLOVE) ×3
GLOVE SKINSENSE STRL SZ7.5 (GLOVE) ×3 IMPLANT
GOWN PREVENTION PLUS XLARGE (GOWN DISPOSABLE) ×3 IMPLANT
GOWN STRL REIN 2XL XLG LVL4 (GOWN DISPOSABLE) ×5 IMPLANT
NDL SUT 6 .5 CRC .975X.05 MAYO (NEEDLE) IMPLANT
NEEDLE MAYO TAPER (NEEDLE)
NEEDLE SCORPION MULTI FIRE (NEEDLE) IMPLANT
NS IRRIG 1000ML POUR BTL (IV SOLUTION) IMPLANT
PACK ARTHROSCOPY DSU (CUSTOM PROCEDURE TRAY) ×3 IMPLANT
PACK BASIN DAY SURGERY FS (CUSTOM PROCEDURE TRAY) ×3 IMPLANT
PASSER SUT SWANSON 36MM LOOP (INSTRUMENTS) IMPLANT
PENCIL BUTTON HOLSTER BLD 10FT (ELECTRODE) ×3 IMPLANT
SET ARTHROSCOPY TUBING (MISCELLANEOUS) ×3
SET ARTHROSCOPY TUBING LN (MISCELLANEOUS) ×2 IMPLANT
SLEEVE SCD COMPRESS KNEE MED (MISCELLANEOUS) ×2 IMPLANT
SLING ARM FOAM STRAP LRG (SOFTGOODS) IMPLANT
SLING ARM FOAM STRAP MED (SOFTGOODS) ×3 IMPLANT
SLING ARM FOAM STRAP XLG (SOFTGOODS) IMPLANT
SLING ARM IMMOBILIZER LRG (SOFTGOODS) IMPLANT
SLING ARM IMMOBILIZER MED (SOFTGOODS) IMPLANT
SPONGE GAUZE 4X4 12PLY (GAUZE/BANDAGES/DRESSINGS) ×6 IMPLANT
SPONGE LAP 4X18 X RAY DECT (DISPOSABLE) IMPLANT
STRIP CLOSURE SKIN 1/2X4 (GAUZE/BANDAGES/DRESSINGS) IMPLANT
SUCTION FRAZIER TIP 10 FR DISP (SUCTIONS) IMPLANT
SUT ETHIBOND 2 OS 4 DA (SUTURE) IMPLANT
SUT ETHILON 2 0 FS 18 (SUTURE) IMPLANT
SUT ETHILON 3 0 PS 1 (SUTURE) IMPLANT
SUT FIBERWIRE #2 38 T-5 BLUE (SUTURE)
SUT RETRIEVER MED (INSTRUMENTS) IMPLANT
SUT TIGER TAPE 7 IN WHITE (SUTURE) IMPLANT
SUT VIC AB 0 CT1 27 (SUTURE)
SUT VIC AB 0 CT1 27XBRD ANBCTR (SUTURE) IMPLANT
SUT VIC AB 2-0 SH 27 (SUTURE)
SUT VIC AB 2-0 SH 27XBRD (SUTURE) IMPLANT
SUT VIC AB 3-0 FS2 27 (SUTURE) IMPLANT
SUTURE FIBERWR #2 38 T-5 BLUE (SUTURE) IMPLANT
TAPE FIBER 2MM 7IN #2 BLUE (SUTURE) IMPLANT
TOWEL OR 17X24 6PK STRL BLUE (TOWEL DISPOSABLE) ×5 IMPLANT
WATER STERILE IRR 1000ML POUR (IV SOLUTION) ×3 IMPLANT
YANKAUER SUCT BULB TIP NO VENT (SUCTIONS) IMPLANT

## 2011-10-14 NOTE — Anesthesia Procedure Notes (Addendum)
Anesthesia Regional Block:  Interscalene brachial plexus block  Pre-Anesthetic Checklist: ,, timeout performed, Correct Patient, Correct Site, Correct Laterality, Correct Procedure, Correct Position, site marked, Risks and benefits discussed,  Surgical consent,  Pre-op evaluation,  At surgeon's request and post-op pain management  Laterality: Left and Upper  Prep: chloraprep       Needles:  Injection technique: Single-shot  Needle Type: Echogenic Needle     Needle Length: 5cm 5 cm Needle Gauge: 21    Additional Needles:  Procedures: ultrasound guided Interscalene brachial plexus block Narrative:  Start time: 10/14/2011 8:16 AM End time: 10/14/2011 8:28 AM Injection made incrementally with aspirations every 5 mL.  Performed by: Personally  Anesthesiologist: Sheldon Silvan, MD  Supraclavicular block Procedure Name: Intubation Date/Time: 10/14/2011 9:03 AM Performed by: Verlan Friends Pre-anesthesia Checklist: Patient identified, Emergency Drugs available, Suction available, Patient being monitored and Timeout performed Patient Re-evaluated:Patient Re-evaluated prior to inductionOxygen Delivery Method: Circle System Utilized Preoxygenation: Pre-oxygenation with 100% oxygen Intubation Type: IV induction Ventilation: Mask ventilation without difficulty Laryngoscope Size: Miller and 3 Grade View: Grade II Tube type: Oral Tube size: 7.0 mm Number of attempts: 1 Airway Equipment and Method: stylet and oral airway Placement Confirmation: ETT inserted through vocal cords under direct vision,  positive ETCO2 and breath sounds checked- equal and bilateral Secured at: 21 cm Tube secured with: Tape Dental Injury: Teeth and Oropharynx as per pre-operative assessment

## 2011-10-14 NOTE — Anesthesia Postprocedure Evaluation (Signed)
  Anesthesia Post-op Note  Patient: Anita Arroyo  Procedure(s) Performed: Procedure(s) (LRB) with comments: SHOULDER ARTHROSCOPY WITH SUBACROMIAL DECOMPRESSION (Left) - ARTHROSCOPY DECOMPRESSION  SUBACROMIAL PARTIAL ACROMIOPLASTY WITH CORACOCROMIAL RELEASE , DISTAL CLAVICULECTOMY, BICEPS TENOTOMY   Patient Location: PACU  Anesthesia Type: General  Level of Consciousness: awake, alert  and oriented  Airway and Oxygen Therapy: Patient Spontanous Breathing  Post-op Pain: none  Post-op Assessment: Post-op Vital signs reviewed  Post-op Vital Signs: Reviewed  Complications: No apparent anesthesia complications

## 2011-10-14 NOTE — Progress Notes (Signed)
Assisted Dr. Crews with left, ultrasound guided, interscalene  block. Side rails up, monitors on throughout procedure. See vital signs in flow sheet. Tolerated Procedure well. 

## 2011-10-14 NOTE — Anesthesia Preprocedure Evaluation (Signed)
Anesthesia Evaluation  Patient identified by MRN, date of birth, ID band Patient awake    Reviewed: Allergy & Precautions, H&P , NPO status , Patient's Chart, lab work & pertinent test results, reviewed documented beta blocker date and time   Airway Mallampati: I TM Distance: >3 FB Neck ROM: Full    Dental  (+) Teeth Intact and Dental Advisory Given   Pulmonary  breath sounds clear to auscultation        Cardiovascular hypertension (Pt denies), Pt. on medications and Pt. on home beta blockers + dysrhythmias (Rapid heart rate treated with Atenolol) Rhythm:Regular Rate:Normal     Neuro/Psych    GI/Hepatic GERD-  Medicated and Controlled,  Endo/Other    Renal/GU      Musculoskeletal   Abdominal   Peds  Hematology   Anesthesia Other Findings   Reproductive/Obstetrics                           Anesthesia Physical Anesthesia Plan  ASA: II  Anesthesia Plan: General   Post-op Pain Management:    Induction: Intravenous  Airway Management Planned: Oral ETT  Additional Equipment:   Intra-op Plan:   Post-operative Plan: Extubation in OR  Informed Consent: I have reviewed the patients History and Physical, chart, labs and discussed the procedure including the risks, benefits and alternatives for the proposed anesthesia with the patient or authorized representative who has indicated his/her understanding and acceptance.     Plan Discussed with: CRNA and Anesthesiologist  Anesthesia Plan Comments:         Anesthesia Quick Evaluation

## 2011-10-14 NOTE — Transfer of Care (Signed)
Immediate Anesthesia Transfer of Care Note  Patient: Anita Arroyo  Procedure(s) Performed: Procedure(s) (LRB) with comments: SHOULDER ARTHROSCOPY WITH SUBACROMIAL DECOMPRESSION (Left) - ARTHROSCOPY DECOMPRESSION  SUBACROMIAL PARTIAL ACROMIOPLASTY WITH CORACOCROMIAL RELEASE , DISTAL CLAVICULECTOMY, BICEPS TENOTOMY   Patient Location: PACU  Anesthesia Type: GA combined with regional for post-op pain  Level of Consciousness: awake, alert , oriented and patient cooperative  Airway & Oxygen Therapy: Patient Spontanous Breathing and Patient connected to face mask oxygen  Post-op Assessment: Report given to PACU RN and Post -op Vital signs reviewed and stable  Post vital signs: Reviewed and stable  Complications: No apparent anesthesia complications

## 2011-10-14 NOTE — Brief Op Note (Signed)
10/14/2011  10:22 AM  PATIENT:  Anita Arroyo  51 y.o. female  PRE-OPERATIVE DIAGNOSIS:  LEFT SHOULDER IMPENGEMENT SYNDROME, RUPTURE TENDON-BICEPS  POST-OPERATIVE DIAGNOSIS:  Left Shoulder Impingement and Biceps tendon tear  PROCEDURE:  Procedure(s) (LRB) with comments: SHOULDER ARTHROSCOPY WITH SUBACROMIAL DECOMPRESSION (Left) - ARTHROSCOPY DECOMPRESSION  SUBACROMIAL PARTIAL ACROMIOPLASTY WITH CORACOCROMIAL RELEASE , DISTAL CLAVICULECTOMY, BICEPS TENOTOMY   SURGEON:  Surgeon(s) and Role:    * Loreta Ave, MD - Primary  PHYSICIAN ASSISTANT: Zonia Kief M   ANESTHESIA:   regional and general  EBL:  Total I/O In: 1400 [I.V.:1400] Out: -  SPECIMEN:  No Specimen  DISPOSITION OF SPECIMEN:  N/A  COUNTS:  YES  TOURNIQUET:  * No tourniquets in log * PATIENT DISPOSITION:  PACU - hemodynamically stable.

## 2011-10-14 NOTE — Interval H&P Note (Signed)
History and Physical Interval Note:  10/14/2011 7:28 AM  Anita Arroyo  has presented today for surgery, with the diagnosis of LEFT SHOULDER IMPENGEMENT SYNDROME, RUPTURE TENDON-BICEPS  The various methods of treatment have been discussed with the patient and family. After consideration of risks, benefits and other options for treatment, the patient has consented to  Procedure(s) (LRB) with comments: SHOULDER ARTHROSCOPY WITH SUBACROMIAL DECOMPRESSION AND BICEP TENDON REPAIR (Left) - ARTHROSCOPY DECOMPRESSION  SUBACROMIAL PARTIAL ACROMIOPLASTY WITH CORACOCROMIAL RELEASE , DISTAL CLAVICULECTOMY, BICEPS TENODESIS  as a surgical intervention .  The patient's history has been reviewed, patient examined, no change in status, stable for surgery.  I have reviewed the patient's chart and labs.  Questions were answered to the patient's satisfaction.     Anita Arroyo F

## 2011-10-18 NOTE — Op Note (Signed)
Anita Arroyo, SCREWS NO.:  [id number]  MEDICAL RECORD NO.:  [id number]  LOCATION:                                 FACILITY:  PHYSICIAN:  Loreta Ave, M.D. DATE OF BIRTH:  1960-04-12  DATE OF PROCEDURE:  10/14/2011 DATE OF DISCHARGE:                              OPERATIVE REPORT   PREOPERATIVE DIAGNOSES:  Left shoulder superior labral tear with tearing of long head biceps tendon.  Subacromial impingement.  POSTOPERATIVE DIAGNOSES:  Left shoulder superior labral tear with tearing of long head biceps tendon.  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  Circumferential degenerative complex tearing of labrum.  Near-functional complete tear of long head biceps tendon at the glenoid attachment.  Reactive subacromial bursitis impingement with some mild abrasive changes on the top of the cuff, but no significant full-thickness tears.  Degenerative joint disease of acromioclavicular Joint.  PROCEDURE:  Left shoulder exam under anesthesia, arthroscopy. Debridement of glenohumeral joint with chondroplasty and removal of loose bodies.  Debridement of labrum.  Release and resection of intra- articular portion of long head biceps tendon allowing it to recess into the bicipital groove for all autotenodesis.  Subacromial bursectomy, acromioplasty, CA ligament release, debridement of rotator cuff. Excision of distal clavicle.  SURGEON:  Loreta Ave, M.D.  ASSISTANT:  Genene Churn. Barry Dienes, [place], present throughout the entire case, necessary for timely completion of the procedure.  ANESTHESIA:  General.  BLOOD LOSS:  Minimal.  SPECIMENS:  None.  CULTURES:  None.  COMPLICATION:  None.  DRESSINGS:  Soft compressive with a sling.  DESCRIPTION OF PROCEDURE:  The patient was brought to the operating room, placed on the operating table in supine position.  After  adequate anesthesia had been obtained, shoulder examined.  Full motion, stable shoulder.  Placed in a beach-chair position on the shoulder positioner, prepped and draped in usual sterile fashion.  Three portals, anterior, posterior, lateral.  Arthroscope introduced, shoulder distended and inspected.  Numerous chondral loose bodies throughout, debrided with a large shaver.  Circumferential tearing of the labrum debrided.  Areas of delamination and chondromalacia on the humeral head and glenoid all debrided.  The rotator cuff throughout within the shoulder looked good. Long-head biceps had marked attrition right at the top of the glenoid with significant tendinopathy and functional almost complete tear.  This was released off the glenoid, the intra-articular portion resected and it was allowed to recess down into the bicipital groove.  Cannula redirected subacromially.  Type 2 acromion marked reactive bursitis. Bursa was resected.  Top of the cuff debrided where there was some abrasive changes from the impingement.  Acromioplasty from type 2 to a type 1 acromion with shaver and high-speed bur.  CA ligament released with cautery to facilitate this.  Distal clavicle was exposed. Periarticular spurs were grade 4 changes.  Periarticular spurs and lateral centimeter of clavicle resected.  At completion, adequacy of decompression confirmed viewing throughout.  Instruments and fluids were removed.  Portals closed with nylon.  Sterile compressive dressing applied.  Sling applied.  Anesthesia reversed.  Brought to the recovery room.  Tolerated surgery well.  No complications.     Loreta Ave, M.D.     DFM/MEDQ  D:  10/14/2011  T:  10/15/2011  Job:  [id number]

## 2011-10-19 ENCOUNTER — Encounter (HOSPITAL_BASED_OUTPATIENT_CLINIC_OR_DEPARTMENT_OTHER): Payer: Self-pay

## 2011-10-26 NOTE — Addendum Note (Signed)
Addendum  created 10/26/11 1528 by Verlan Friends, CRNA   Modules edited:Inpatient Notes

## 2011-10-26 NOTE — Progress Notes (Signed)
Spoke with Anita Arroyo by phone concerning her phone call about her face blistering.  We discussed the type of tape used during her surgery.  Paper tape was used to close eyes and secure ETT.   Patient stated approximately 2-3 days after surgery her eyelids blistered, and there were blisters around her mouth.  Paper tape was used because it is the most benign tape we have, because she is allergic to adhesives.  Reassurance was given.  She stated she has had this problem before, and she just needed to tell her dermatologist what might be the cause of the problem. Patient seemed understanding that to protect her eyes and to secure ETT we needed to use some tape.

## 2011-10-27 ENCOUNTER — Ambulatory Visit: Payer: Medicare Other

## 2011-11-02 ENCOUNTER — Ambulatory Visit: Payer: Medicare Other

## 2011-11-03 ENCOUNTER — Ambulatory Visit: Payer: Medicare Other | Attending: Orthopedic Surgery

## 2011-11-03 DIAGNOSIS — IMO0001 Reserved for inherently not codable concepts without codable children: Secondary | ICD-10-CM | POA: Insufficient documentation

## 2011-11-03 DIAGNOSIS — M25619 Stiffness of unspecified shoulder, not elsewhere classified: Secondary | ICD-10-CM | POA: Insufficient documentation

## 2011-11-03 DIAGNOSIS — M6281 Muscle weakness (generalized): Secondary | ICD-10-CM | POA: Insufficient documentation

## 2011-11-03 DIAGNOSIS — M25519 Pain in unspecified shoulder: Secondary | ICD-10-CM | POA: Insufficient documentation

## 2011-11-03 DIAGNOSIS — R5381 Other malaise: Secondary | ICD-10-CM | POA: Insufficient documentation

## 2011-11-10 ENCOUNTER — Ambulatory Visit: Payer: Medicare Other

## 2011-11-11 ENCOUNTER — Ambulatory Visit: Payer: Medicare Other | Admitting: Physical Therapy

## 2011-11-15 ENCOUNTER — Ambulatory Visit: Payer: Medicare Other | Admitting: Physical Therapy

## 2011-11-18 ENCOUNTER — Ambulatory Visit: Payer: Medicare Other | Admitting: Physical Therapy

## 2011-11-22 ENCOUNTER — Ambulatory Visit: Payer: Medicare Other | Admitting: Physical Therapy

## 2011-11-25 ENCOUNTER — Ambulatory Visit: Payer: Medicare Other | Admitting: Physical Therapy

## 2011-11-29 ENCOUNTER — Ambulatory Visit: Payer: Medicare Other | Attending: Orthopedic Surgery | Admitting: Physical Therapy

## 2011-11-29 DIAGNOSIS — Z96659 Presence of unspecified artificial knee joint: Secondary | ICD-10-CM | POA: Insufficient documentation

## 2011-11-29 DIAGNOSIS — M6281 Muscle weakness (generalized): Secondary | ICD-10-CM | POA: Insufficient documentation

## 2011-11-29 DIAGNOSIS — M25619 Stiffness of unspecified shoulder, not elsewhere classified: Secondary | ICD-10-CM | POA: Insufficient documentation

## 2011-11-29 DIAGNOSIS — R5381 Other malaise: Secondary | ICD-10-CM | POA: Insufficient documentation

## 2011-11-29 DIAGNOSIS — M25519 Pain in unspecified shoulder: Secondary | ICD-10-CM | POA: Insufficient documentation

## 2011-11-29 DIAGNOSIS — IMO0001 Reserved for inherently not codable concepts without codable children: Secondary | ICD-10-CM | POA: Insufficient documentation

## 2011-12-02 ENCOUNTER — Ambulatory Visit: Payer: Medicare Other | Admitting: Physical Therapy

## 2011-12-06 ENCOUNTER — Ambulatory Visit: Payer: Medicare Other

## 2011-12-09 ENCOUNTER — Ambulatory Visit: Payer: Medicare Other | Admitting: Physical Therapy

## 2011-12-13 ENCOUNTER — Ambulatory Visit: Payer: Medicare Other | Admitting: Physical Therapy

## 2011-12-16 ENCOUNTER — Ambulatory Visit: Payer: Medicare Other | Admitting: Physical Therapy

## 2011-12-20 ENCOUNTER — Ambulatory Visit: Payer: Medicare Other

## 2011-12-22 ENCOUNTER — Ambulatory Visit: Payer: Medicare Other | Admitting: Physical Therapy

## 2011-12-24 ENCOUNTER — Other Ambulatory Visit: Payer: Self-pay | Admitting: Oncology

## 2011-12-24 DIAGNOSIS — C50919 Malignant neoplasm of unspecified site of unspecified female breast: Secondary | ICD-10-CM

## 2011-12-27 ENCOUNTER — Other Ambulatory Visit: Payer: Self-pay | Admitting: *Deleted

## 2011-12-27 DIAGNOSIS — C50919 Malignant neoplasm of unspecified site of unspecified female breast: Secondary | ICD-10-CM

## 2011-12-27 MED ORDER — TAMOXIFEN CITRATE 20 MG PO TABS: 20.0000 mg | ORAL_TABLET | Freq: Every day | ORAL | Status: DC

## 2011-12-29 ENCOUNTER — Ambulatory Visit: Payer: Medicare Other | Admitting: Physical Therapy

## 2011-12-31 ENCOUNTER — Encounter: Payer: Medicare Other | Admitting: Physical Therapy

## 2012-01-03 ENCOUNTER — Ambulatory Visit: Payer: Medicare Other | Admitting: Physical Therapy

## 2012-01-05 ENCOUNTER — Encounter: Payer: Medicare Other | Admitting: Physical Therapy

## 2012-01-06 ENCOUNTER — Encounter: Payer: Medicare Other | Admitting: Physical Therapy

## 2012-01-07 ENCOUNTER — Encounter: Payer: Medicare Other | Admitting: Physical Therapy

## 2012-04-15 ENCOUNTER — Encounter: Payer: Self-pay | Admitting: Oncology

## 2012-04-15 ENCOUNTER — Telehealth: Payer: Self-pay | Admitting: *Deleted

## 2012-04-15 NOTE — Telephone Encounter (Signed)
sw pt gv appt d/t .Marland Kitchenmade her aware that she could call Misty Stanley, due to they didn't not send a list of doctors for her to choose from.

## 2012-04-18 ENCOUNTER — Encounter (INDEPENDENT_AMBULATORY_CARE_PROVIDER_SITE_OTHER): Payer: Self-pay | Admitting: General Surgery

## 2012-06-20 ENCOUNTER — Telehealth: Payer: Self-pay | Admitting: Cardiovascular Disease

## 2012-06-20 MED ORDER — EZETIMIBE 10 MG PO TABS: 10.0000 mg | ORAL_TABLET | Freq: Every day | ORAL | Status: DC

## 2012-06-20 NOTE — Telephone Encounter (Signed)
Informed patient zetia samples will be left at the front desk.

## 2012-06-20 NOTE — Telephone Encounter (Signed)
Need some samples of Zetia 10mg  Please!

## 2012-06-29 ENCOUNTER — Other Ambulatory Visit: Payer: Medicare Other | Admitting: Lab

## 2012-06-29 ENCOUNTER — Other Ambulatory Visit (HOSPITAL_BASED_OUTPATIENT_CLINIC_OR_DEPARTMENT_OTHER): Payer: Medicare Other | Admitting: Lab

## 2012-06-29 DIAGNOSIS — C50119 Malignant neoplasm of central portion of unspecified female breast: Secondary | ICD-10-CM

## 2012-06-29 DIAGNOSIS — C50919 Malignant neoplasm of unspecified site of unspecified female breast: Secondary | ICD-10-CM

## 2012-06-29 DIAGNOSIS — E559 Vitamin D deficiency, unspecified: Secondary | ICD-10-CM

## 2012-06-29 LAB — COMPREHENSIVE METABOLIC PANEL (CC13)
ALT: 16 U/L (ref 0–55)
Albumin: 3.9 g/dL (ref 3.5–5.0)
CO2: 26 mEq/L (ref 22–29)
Chloride: 104 mEq/L (ref 98–107)
Glucose: 107 mg/dl — ABNORMAL HIGH (ref 70–99)
Potassium: 4.4 mEq/L (ref 3.5–5.1)
Sodium: 139 mEq/L (ref 136–145)
Total Protein: 7.1 g/dL (ref 6.4–8.3)

## 2012-06-29 LAB — CBC WITH DIFFERENTIAL/PLATELET
Basophils Absolute: 0 10*3/uL (ref 0.0–0.1)
Eosinophils Absolute: 0.1 10*3/uL (ref 0.0–0.5)
HCT: 41.2 % (ref 34.8–46.6)
HGB: 14.2 g/dL (ref 11.6–15.9)
MCH: 32.3 pg (ref 25.1–34.0)
MONO#: 0.5 10*3/uL (ref 0.1–0.9)
NEUT#: 3.8 10*3/uL (ref 1.5–6.5)
NEUT%: 61.9 % (ref 38.4–76.8)
RDW: 13.3 % (ref 11.2–14.5)
WBC: 6.1 10*3/uL (ref 3.9–10.3)
lymph#: 1.8 10*3/uL (ref 0.9–3.3)

## 2012-06-30 LAB — VITAMIN D 25 HYDROXY (VIT D DEFICIENCY, FRACTURES): Vit D, 25-Hydroxy: 47 ng/mL (ref 30–89)

## 2012-06-30 LAB — FOLLICLE STIMULATING HORMONE: FSH: 22.7 m[IU]/mL

## 2012-07-06 ENCOUNTER — Ambulatory Visit (HOSPITAL_BASED_OUTPATIENT_CLINIC_OR_DEPARTMENT_OTHER): Payer: Medicare Other | Admitting: Oncology

## 2012-07-06 ENCOUNTER — Telehealth: Payer: Self-pay | Admitting: Oncology

## 2012-07-06 ENCOUNTER — Ambulatory Visit: Payer: Medicare Other | Admitting: Oncology

## 2012-07-06 VITALS — BP 114/58 | HR 54 | Temp 97.9°F | Resp 20 | Ht 62.0 in | Wt 156.1 lb

## 2012-07-06 DIAGNOSIS — F319 Bipolar disorder, unspecified: Secondary | ICD-10-CM

## 2012-07-06 DIAGNOSIS — C50919 Malignant neoplasm of unspecified site of unspecified female breast: Secondary | ICD-10-CM

## 2012-07-06 DIAGNOSIS — Z170 Estrogen receptor positive status [ER+]: Secondary | ICD-10-CM

## 2012-07-06 DIAGNOSIS — F909 Attention-deficit hyperactivity disorder, unspecified type: Secondary | ICD-10-CM

## 2012-07-06 DIAGNOSIS — D0590 Unspecified type of carcinoma in situ of unspecified breast: Secondary | ICD-10-CM

## 2012-07-06 MED ORDER — TAMOXIFEN CITRATE 20 MG PO TABS: 20.0000 mg | ORAL_TABLET | Freq: Every day | ORAL | Status: DC

## 2012-07-06 NOTE — Progress Notes (Signed)
[facility]  [phone] Fax:([phone]) [phone]  OFFICE PROGRESS NOTE   ID: TRISTA CIOCCA   DOB: 12-12-1960  MR#: [id number]  ZHY#:[id number]   PCP: Jacklynn Barnacle, NP GYN:  SU:  OTHER MD: Erik Obey, Miguel Dibble   HISTORY OF PRESENT ILLNESS: From Dr. Theron Arista Rubin's new patient evaluation note dated 06/18/2009: "This is a pleasant 52 year old woman from [place] who is here with her mother for evaluation and treatment of breast cancer.  This woman has been in reasonably good health.  She does have a history of ADHD and bipolar disease.  She has not had a mammogram in about two years.  She sought medical attention because of intense itchiness of her left nipple.  She subsequently had a screening mammogram on 04/22/2009, which showed calcifications of right breast visual views recommended.  A diagnostic mammogram and ultrasound was performed on 04/28/2009.  Physical exam did not reveal any abnormalities.  Her ultrasound did show multiple hypoechoic irregularities of the right breast.  Most of these were simple cysts.  A suspicious irregular hypoechoic lesion was seen measuring 2.3 x 0.7 x 1.1 cm.  Calcifications of the right breast with distortion were also seen.  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  A subsequent MRI scan of both breasts was performed on 05/13/2009. In the right breast at 12 o'clock position there was irregular area of enhancement measuring 1.7 x 1.6 x 1.3 cm.  No abnormal lymph nodes were seen and the other breast was normal.  The patient did have some intolerance to the contrast material.  The patient ultimately underwent a lumpectomy and sentinel lymph node evaluation on 06/02/2009.  Initially, the feeling was that this was an invasive and in situ ductal carcinoma with three benign sentinel lymph nodes, total area was 1.5 cm, margins were uninvolved.  Histological grade  was 2.  The subsequent reevaluation of this material after special stains were performed suggested that this was in fact all DCIS, margins were uninvolved.  ER/PR positive at 89% and 35% respectively.  Three sentinel lymph nodes were again negative for malignancy.  The patient has had an unremarkable postoperative course."  Her subsequent history is as detailed below.   INTERVAL HISTORY: Dr. Darnelle Catalan and I saw [name] today for followup of her non invasive bvreast cancer.  Since her last office visit, the patient has been doing relatively well.  She is establishing herself with Dr. Darrall Dears service today.  REVIEW OF SYSTEMS: The patient has chronic problems with bipolar disorder and ADHD. She is disabled partially because of that and partially because of problems from a remote AA. She is tolerating the tamoxifen without significant hot flashes or other symptoms she is aware of. A detailed review of systems was otherwise noncontributory  PAST MEDICAL HISTORY: Past Medical History  Diagnosis Date  . Heart murmur   . ADHD (attention deficit hyperactivity disorder)   . Bipolar 1 disorder   . Arthritis   . Meniere disease   . Neuromuscular disorder     fibromyalgia  . Fibromyalgia   . GERD (gastroesophageal reflux disease)   . Dysrhythmia     palpitations  . Depression   . Bipolar 1 disorder   Significant for history of the left ankle surgery x4, history of arthritis, history of fiber polyp removed in February 2011 from the uterus, history of left wrist surgery, history of right elbow surgery.  History  of ADHD diagnosed 10 years ago.  She has a history of Mnire's diagnosed a number of years ago.   PAST SURGICAL HISTORY: Past Surgical History  Procedure Laterality Date  . Tonsillectomy    . Deviated septum    . Ectopic pregnancy surgery    . Tmj arthroplasty    . Uterine fibroid surgery      polups and fibroids removed   . Elbow surgery      right elbow  . Wrist surgery       left  . Ankle surgery      left x4  . Knee surgery      left knee  . Fibroid      2-3 fibroid adenomas removed  . Joint replacement  2011    right knee   . Breast surgery  2011    lumpectomy right breast     FAMILY HISTORY No family history on file. The patient is adopted. However she has contacted her biological mother, "myrtle". Girdle was diagnosed with Breast cancer (? the patient is not sure) before the age of 53. Marital was only 14 when she had the patient. The patient has not contacted her biological father.  GYNECOLOGIC HISTORY: Gravida 0, para 0, menarche at 53 or 52, last menstrual period was March of 2013.  No history of hormone replacement therapy.  SOCIAL HISTORY: The patient is single.  She is on disability secondary to her bipolar disease.  She works at pets [employer], and does some gardening. She has 4 cats at home.   ADVANCED DIRECTIVES:  HEALTH MAINTENANCE: History  Substance Use Topics  . Smoking status: Current Every Day Smoker -- 1.50 packs/day for 30 years    Types: Cigarettes  . Smokeless tobacco: Never Used  . Alcohol Use: Yes     Comment: occ    Colonoscopy: PAP: Bone density: Lipid panel:  Allergies  Allergen Reactions  . Contrast Media (Iodinated Diagnostic Agents) Shortness Of Breath  . Adhesive (Tape)     blister  . Amoxicillin-Pot Clavulanate   . Iodine   . Moxifloxacin   . Sulfonamide Derivatives   . Latex     nausea    Current Outpatient Prescriptions  Medication Sig Dispense Refill  . atenolol (TENORMIN) 25 MG tablet Take 25 mg by mouth at bedtime.      Marland Kitchen atenolol (TENORMIN) 50 MG tablet Take 50 mg by mouth daily.       . Azelastine HCl (ASTEPRO) 0.15 % SOLN by Nasal route as directed.        . cetirizine (ZYRTEC) 10 MG tablet Take by mouth as directed.        . Emollient (NEOSALUS CP) CREA Apply 1 application topically daily as needed.      . ezetimibe (ZETIA) 10 MG tablet Take 1 tablet (10 mg total) by mouth daily.  35  tablet  0  . fluticasone (FLONASE) 50 MCG/ACT nasal spray by Nasal route as directed.        . gabapentin (NEURONTIN) 100 MG capsule Take 100 mg by mouth 4 (four) times daily.      Marland Kitchen lamoTRIgine (LAMICTAL) 150 MG tablet Take 150 mg by mouth 2 (two) times daily.       . Melatonin 1 MG TABS Take 1 tablet by mouth every evening.      . methylphenidate (RITALIN) 20 MG tablet Take 20 mg by mouth 4 (four) times daily - after meals and at bedtime.       Marland Kitchen  oxyCODONE-acetaminophen (PERCOCET/ROXICET) 5-325 MG per tablet Take 1 tablet by mouth every 4 (four) hours as needed. Pain      . tamoxifen (NOLVADEX) 20 MG tablet Take 1 tablet (20 mg total) by mouth daily.  30 tablet  12  . tamoxifen (NOLVADEX) 20 MG tablet Take 1 tablet (20 mg total) by mouth daily.  30 tablet  6  . traZODone (DESYREL) 100 MG tablet Take 100 mg by mouth as directed. Takes 2      . triamterene-hydrochlorothiazide (DYAZIDE) 37.5-25 MG per capsule Take by mouth as directed.        . varenicline (CHANTIX) 0.5 MG tablet Take 0.5 mg by mouth 2 (two) times daily.      Marland Kitchen venlafaxine (EFFEXOR-XR) 150 MG 24 hr capsule Take 150 mg by mouth daily.        No current facility-administered medications for this visit.    OBJECTIVE: Middle-aged white woman in no acute distress Filed Vitals:   07/06/12 1418  BP: 114/58  Pulse: 54  Temp: 97.9 F (36.6 C)  Resp: 20     Body mass index is 28.54 kg/(m^2).      ECOG FS: 1.vita Sclerae unicteric Oropharynx clear No cervical or supraclavicular adenopathy Lungs no rales or rhonchi Heart regular rate and rhythm Abd benign MSK no focal spinal tenderness, no peripheral edema Neuro: nonfocal Breasts: The right breast is status post lumpectomy and radiation. There is no evidence of local recurrence. The right axilla is benign. The left breast is unremarkable.   LAB RESULTS: Lab Results  Component Value Date   WBC 6.1 06/29/2012   NEUTROABS 3.8 06/29/2012   HGB 14.2 06/29/2012   HCT 41.2  06/29/2012   MCV 93.8 06/29/2012   PLT 218 06/29/2012      Chemistry      Component Value Date/Time   NA 139 06/29/2012 1221   NA 141 10/12/2011 1555   K 4.4 06/29/2012 1221   K 3.7 10/12/2011 1555   CL 104 06/29/2012 1221   CL 102 10/12/2011 1555   CO2 26 06/29/2012 1221   CO2 29 10/12/2011 1555   BUN 21.4 06/29/2012 1221   BUN 14 10/12/2011 1555   CREATININE 0.8 06/29/2012 1221   CREATININE 0.87 10/12/2011 1555      Component Value Date/Time   CALCIUM 9.4 06/29/2012 1221   CALCIUM 9.6 10/12/2011 1555   ALKPHOS 77 06/29/2012 1221   ALKPHOS 65 06/30/2010 1338   AST 22 06/29/2012 1221   AST 25 06/30/2010 1338   ALT 16 06/29/2012 1221   ALT 17 06/30/2010 1338   BILITOT 0.25 06/29/2012 1221   BILITOT 0.3 06/30/2010 1338       Lab Results  Component Value Date   LABCA2 19 12/31/2009    Urinalysis    Component Value Date/Time   COLORURINE YELLOW 02/13/2010 1130   APPEARANCEUR HAZY* 02/13/2010 1130   LABSPEC 1.017 02/13/2010 1130   PHURINE 7.0 02/13/2010 1130   GLUCOSEU NEGATIVE 04/25/2007 2336   HGBUR SMALL* 02/13/2010 1130   BILIRUBINUR NEGATIVE 02/13/2010 1130   KETONESUR NEGATIVE 02/13/2010 1130   PROTEINUR NEGATIVE 02/13/2010 1130   UROBILINOGEN 0.2 02/13/2010 1130   NITRITE NEGATIVE 02/13/2010 1130   LEUKOCYTESUR NEGATIVE 02/13/2010 1130    STUDIES: No results found.   ASSESSMENT: 52 y.o. woman: 1.  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  2.  Status post bilateral breast MRI on 05/13/2009 which showed an irregular mass-like area of enhancement which was more prominent than the surrounding background parenchymal enhancement pattern.  The irregular area measured 1.7 x 1.6 x 1.3 cm.  No definite multicentric or multifocal disease was identified in the right breast.  No dominant mass or asymmetric area of enhancement was identified in the  left breast to suggest malignancy.  Negative for axillary or internal mammary chain lymphadenopathy.  3.  Status post right breast lumpectomy with right axillary lymph node sentinel biopsy on 06/02/2009 for DCIS (initially read as invasive, but later revised as showing only non-invasive disease)  4.  Status post radiation therapy from 07/07/2009 through 08/19/2009.  5.  Comprehensive [facility]ACA analysis report dated 10/09/2010 showed the patient was negative for the BRCA1 and BRCA2 gene mutations.  6.  The patient started Tamoxifen in 09/2009.  7.  The patient's last bilateral digital diagnostic mammogram on 09/16/2011 showed stable benign appearing calcifications bilaterally.  Expected right lumpectomy changes are again noted.   8.  Status post bilateral breast MRI on 09/30/2011 showed No specific MRI evidence of malignancy in either breast.  Post-treatment changes of the right breast.  Significant decrease in background parenchymal enhancement pattern compared to prior study of MRI likely reflects changes in hormonal status or hormonal therapy.  Bilateral diagnostic mammogram recommended in 08/2012.  PLAN: See Dr. Elza Rafter naps addendum for details. The patient will return here for followup next year after her mammography.  Larina Bras, NP-C 07/07/2012, 1:44 PM  ADDENDUM: Mairim Bade is a 52 year old [place] woman establishing herself in my practice today. She underwent right lumpectomy and sentinel lymph node sampling may of 2011, for what initially was read as invasive disease, but after further evaluation proved to be only ductal carcinoma in situ. The tumor was estrogen and progesterone receptor positive, and after radiation she started tamoxifen (September of 2011.  She is tolerating tamoxifen well. Her most recent [facility], however, is not particularly elevated. I think she still perimenopausal and therefore we are not ready to switch to an aromatase inhibitor. In any case, 5 years of  tamoxifen is adequate therapy for noninvasive disease.  All this was discussed with [facility] today. She knows to call for any problems that may develop before next visit here, which will be in one year.  Lowella Dell, MD

## 2012-07-07 ENCOUNTER — Other Ambulatory Visit: Payer: Self-pay | Admitting: Oncology

## 2012-07-12 ENCOUNTER — Other Ambulatory Visit: Payer: Self-pay | Admitting: Cardiovascular Disease

## 2012-07-30 ENCOUNTER — Encounter (HOSPITAL_COMMUNITY): Payer: Self-pay | Admitting: *Deleted

## 2012-07-30 ENCOUNTER — Emergency Department (HOSPITAL_COMMUNITY)
Admission: EM | Admit: 2012-07-30 | Discharge: 2012-07-31 | Disposition: A | Discharge status: Home / Self Care | Payer: Medicare Other | Attending: Emergency Medicine | Admitting: Emergency Medicine

## 2012-07-30 ENCOUNTER — Emergency Department (HOSPITAL_COMMUNITY): Payer: Medicare Other

## 2012-07-30 DIAGNOSIS — Z79899 Other long term (current) drug therapy: Secondary | ICD-10-CM | POA: Insufficient documentation

## 2012-07-30 DIAGNOSIS — Y939 Activity, unspecified: Secondary | ICD-10-CM | POA: Insufficient documentation

## 2012-07-30 DIAGNOSIS — R011 Cardiac murmur, unspecified: Secondary | ICD-10-CM | POA: Insufficient documentation

## 2012-07-30 DIAGNOSIS — Z8669 Personal history of other diseases of the nervous system and sense organs: Secondary | ICD-10-CM | POA: Insufficient documentation

## 2012-07-30 DIAGNOSIS — F329 Major depressive disorder, single episode, unspecified: Secondary | ICD-10-CM | POA: Insufficient documentation

## 2012-07-30 DIAGNOSIS — Z8719 Personal history of other diseases of the digestive system: Secondary | ICD-10-CM | POA: Insufficient documentation

## 2012-07-30 DIAGNOSIS — Z91040 Latex allergy status: Secondary | ICD-10-CM | POA: Insufficient documentation

## 2012-07-30 DIAGNOSIS — F3289 Other specified depressive episodes: Secondary | ICD-10-CM | POA: Insufficient documentation

## 2012-07-30 DIAGNOSIS — F17200 Nicotine dependence, unspecified, uncomplicated: Secondary | ICD-10-CM | POA: Insufficient documentation

## 2012-07-30 DIAGNOSIS — S2239XA Fracture of one rib, unspecified side, initial encounter for closed fracture: Secondary | ICD-10-CM | POA: Insufficient documentation

## 2012-07-30 DIAGNOSIS — S2231XA Fracture of one rib, right side, initial encounter for closed fracture: Secondary | ICD-10-CM

## 2012-07-30 DIAGNOSIS — Z8739 Personal history of other diseases of the musculoskeletal system and connective tissue: Secondary | ICD-10-CM | POA: Insufficient documentation

## 2012-07-30 DIAGNOSIS — F319 Bipolar disorder, unspecified: Secondary | ICD-10-CM | POA: Insufficient documentation

## 2012-07-30 DIAGNOSIS — F909 Attention-deficit hyperactivity disorder, unspecified type: Secondary | ICD-10-CM | POA: Insufficient documentation

## 2012-07-30 DIAGNOSIS — IMO0001 Reserved for inherently not codable concepts without codable children: Secondary | ICD-10-CM | POA: Insufficient documentation

## 2012-07-30 DIAGNOSIS — W1809XA Striking against other object with subsequent fall, initial encounter: Secondary | ICD-10-CM | POA: Insufficient documentation

## 2012-07-30 DIAGNOSIS — Y929 Unspecified place or not applicable: Secondary | ICD-10-CM | POA: Insufficient documentation

## 2012-07-30 MED ORDER — OXYCODONE-ACETAMINOPHEN 5-325 MG PO TABS: ORAL_TABLET | ORAL | Status: DC

## 2012-07-30 NOTE — ED Notes (Signed)
Pt fell x 2 days ago, landing on R ribs on the arm of an iron chair. Pt has bruise and abrasion to R lateral, lower ribs. Pt states worsening pain today.

## 2012-07-30 NOTE — ED Provider Notes (Signed)
History    CSN: [id number] Arrival date & time 07/30/12  2146  First MD Initiated Contact with Patient 07/30/12 2222     Chief Complaint  Patient presents with  . Fall   (Consider location/radiation/quality/duration/timing/severity/associated sxs/prior Treatment) HPI  Anita Arroyo is a 52 y.o. female complaining of right rib pain s/p trip and fall x2 days ago. Pt states she slipped and her ribs came into contact with the arm of a chair that was made of iron. Associated symptoms of bruising. Pain is 7/10 exacerbated by position and palpation. Pt has been taking 5mg  percocet for chronic pain fibromyalgia pain with no relief. Pt denies SOB, abdominal pain. Head trauma, LOC, cervicalgia.   Past Medical History  Diagnosis Date  . Heart murmur   . ADHD (attention deficit hyperactivity disorder)   . Bipolar 1 disorder   . Arthritis   . Meniere disease   . Neuromuscular disorder     fibromyalgia  . Fibromyalgia   . GERD (gastroesophageal reflux disease)   . Dysrhythmia     palpitations  . Depression   . Bipolar 1 disorder    Past Surgical History  Procedure Laterality Date  . Tonsillectomy    . Deviated septum    . Ectopic pregnancy surgery    . Tmj arthroplasty    . Uterine fibroid surgery      polups and fibroids removed   . Elbow surgery      right elbow  . Wrist surgery      left  . Ankle surgery      left x4  . Knee surgery      left knee  . Fibroid      2-3 fibroid adenomas removed  . Joint replacement  2011    right knee   . Breast surgery  2011    lumpectomy right breast    History reviewed. No pertinent family history. History  Substance Use Topics  . Smoking status: Current Every Day Smoker -- 1.50 packs/day for 30 years    Types: Cigarettes  . Smokeless tobacco: Never Used  . Alcohol Use: Yes     Comment: occ   OB History   Grav Para Term Preterm Abortions TAB SAB Ect Mult Living                 Review of Systems  Constitutional:   Negative except as described in HPI  HENT:       Negative except as described in HPI  Respiratory:       Negative except as described in HPI  Cardiovascular:       Negative except as described in HPI  Gastrointestinal:       Negative except as described in HPI  Genitourinary:       Negative except as described in HPI  Musculoskeletal:       Negative except as described in HPI  Skin:       Negative except as described in HPI  Neurological:       Negative except as described in HPI  All other systems reviewed and are negative.    Allergies  Contrast media; Adhesive; Amoxicillin-pot clavulanate; Iodine; Moxifloxacin; Sulfonamide derivatives; and Latex  Home Medications   Current Outpatient Rx  Name  Route  Sig  Dispense  Refill  . atenolol (TENORMIN) 25 MG tablet   Oral   Take 25 mg by mouth at bedtime.         Anita Arroyo atenolol (TENORMIN)  50 MG tablet      TAKE 1 & 1/2 TABLETS EVERY DAY   135 tablet   0   . Azelastine HCl (ASTEPRO) 0.15 % SOLN   Nasal   by Nasal route as directed.           . cetirizine (ZYRTEC) 10 MG tablet   Oral   Take by mouth as directed.           . Emollient (NEOSALUS CP) CREA   Apply externally   Apply 1 application topically daily as needed.         . ezetimibe (ZETIA) 10 MG tablet   Oral   Take 1 tablet (10 mg total) by mouth daily.   35 tablet   0   . fluticasone (FLONASE) 50 MCG/ACT nasal spray   Nasal   by Nasal route as directed.           . gabapentin (NEURONTIN) 100 MG capsule   Oral   Take 100 mg by mouth 4 (four) times daily.         Anita Arroyo lamoTRIgine (LAMICTAL) 150 MG tablet   Oral   Take 150 mg by mouth 2 (two) times daily.          . Melatonin 1 MG TABS   Oral   Take 1 tablet by mouth every evening.         . methylphenidate (RITALIN) 20 MG tablet   Oral   Take 20 mg by mouth 4 (four) times daily - after meals and at bedtime.          Anita Arroyo oxyCODONE-acetaminophen (PERCOCET/ROXICET) 5-325 MG per  tablet   Oral   Take 1 tablet by mouth every 4 (four) hours as needed. Pain         . tamoxifen (NOLVADEX) 20 MG tablet   Oral   Take 1 tablet (20 mg total) by mouth daily.   30 tablet   12   . tamoxifen (NOLVADEX) 20 MG tablet   Oral   Take 1 tablet (20 mg total) by mouth daily.   30 tablet   6   . traZODone (DESYREL) 100 MG tablet   Oral   Take 100 mg by mouth as directed. Takes 2         . triamterene-hydrochlorothiazide (DYAZIDE) 37.5-25 MG per capsule   Oral   Take by mouth as directed.           . varenicline (CHANTIX) 0.5 MG tablet   Oral   Take 0.5 mg by mouth 2 (two) times daily.         Anita Arroyo venlafaxine (EFFEXOR-XR) 150 MG 24 hr capsule   Oral   Take 150 mg by mouth daily.           BP 99/56  Pulse 68  Temp(Src) 99 F (37.2 C) (Oral)  Resp 20  Ht 5' 2.5" (1.588 m)  Wt 153 lb (69.4 kg)  BMI 27.52 kg/m2  SpO2 97% Physical Exam  Nursing note and vitals reviewed. Constitutional: She is oriented to person, place, and time. She appears well-developed and well-nourished. No distress.  HENT:  Head: Normocephalic and atraumatic.  Mouth/Throat: Oropharynx is clear and moist.  Eyes: Conjunctivae and EOM are normal. Pupils are equal, round, and reactive to light.  Neck: Normal range of motion.  No midline tenderness to palpation or step-offs appreciated. Patient has full range of motion without pain.   Cardiovascular: Normal rate.   Pulmonary/Chest: Effort normal and  breath sounds normal. No stridor. No respiratory distress. She has no wheezes. She has no rales. She exhibits tenderness.    Excellent air movement in all fields, TTP of right lower anterior axillary line with ecchymoses. No crepitance.   Abdominal: Soft. Bowel sounds are normal. She exhibits no distension and no mass. There is no tenderness. There is no rebound and no guarding.  Musculoskeletal: Normal range of motion.  Able to move all major joints without pain. No snuffbox tenderness  bilaterally,   Neurological: She is alert and oriented to person, place, and time.  Follows commands, Goal oriented speech, Strength is 5 out of 5x4 extremities, patient ambulates with a coordinated in nonantalgic gait. Sensation is grossly intact.   Psychiatric: She has a normal mood and affect.    ED Course  Procedures (including critical care time) Labs Reviewed - No data to display No results found. No diagnosis found.  MDM   Filed Vitals:   07/30/12 2153  BP: 99/56  Pulse: 68  Temp: 99 F (37.2 C)  TempSrc: Oral  Resp: 20  Height: 5' 2.5" (1.588 m)  Weight: 153 lb (69.4 kg)  SpO2: 97%     Anita Arroyo is a 52 y.o. female  With right rib pain s/p trip and fall 2 days ago with no other associated trauma and no SOB. Saturating well on RA. XR shows non disp[laced right 10th rib Fx with no pneumothorax or effusion. Pt is taking oxycodone 5mg  q6hrs for chronic pain with little relief . I have instructed her that she can take up to 10mg  q4hrs PRN. She has asked that I supplement her prescription because she will run out early and cannot have them refilled on this schedule. I have written her a script. She understands not to take them simultaneously. We have discussed the importance of aeration to prevent atelectasis and pneumonia. I have asked the Pt to take 10 deep breaths every hour. We have discussed return precautions for cough, SOB, Fever.   Pt is hemodynamically stable, appropriate for, and amenable to discharge at this time. Pt verbalized understanding and agrees with care plan. Outpatient follow-up and specific return precautions discussed.    Wynetta Emery, PA-C 08/01/12 [phone]

## 2012-08-01 NOTE — ED Provider Notes (Signed)
Medical screening examination/treatment/procedure(s) were performed by non-physician practitioner and as supervising physician I was immediately available for consultation/collaboration.  Oreta Soloway K Linker, MD 08/01/12 1519 

## 2012-08-09 ENCOUNTER — Telehealth: Payer: Self-pay | Admitting: Cardiovascular Disease

## 2012-08-09 MED ORDER — EZETIMIBE 10 MG PO TABS: 10.0000 mg | ORAL_TABLET | Freq: Every day | ORAL | Status: DC

## 2012-08-09 NOTE — Telephone Encounter (Signed)
Samples left at front desk for pt pick up.  Lot: [id number] Exp: 06/2014. Returned call and left samples left at front desk and to call back before 4pm if questions.

## 2012-08-09 NOTE — Telephone Encounter (Signed)
Mrs.Ziff is wanting to get some samples of Zetia 10mg  ..Please call  Thanks

## 2012-09-05 ENCOUNTER — Telehealth: Payer: Self-pay | Admitting: Cardiovascular Disease

## 2012-09-05 NOTE — Telephone Encounter (Signed)
Returned call.  Pt informed no samples available. Pt advised to f/u low fat diet and exercise as tolerated.  Pt verbalized understanding and agreed w/ plan.  Pt wanted Dr. Tresa Endo to know that she will not be taking anything since there are no samples.  Pt advised to call back later to check if samples received.  Pt agreed.  Message forwarded to Dr. Tresa Endo per pt request.  Anita Arroyo)

## 2012-09-05 NOTE — Telephone Encounter (Signed)
Would like some samples of Zetia 10 mg please. °

## 2012-09-08 ENCOUNTER — Telehealth: Payer: Self-pay | Admitting: Cardiovascular Disease

## 2012-09-08 MED ORDER — EZETIMIBE 10 MG PO TABS: 10.0000 mg | ORAL_TABLET | Freq: Every day | ORAL | Status: DC

## 2012-09-08 NOTE — Telephone Encounter (Signed)
Samples left at front desk for pt pick up.  Lot: [id number] Exp: 06/2014.  Returned call and pt informed samples left at front desk.  Pt verbalized understanding and agreed w/ plan.

## 2012-09-08 NOTE — Telephone Encounter (Signed)
Is wanting to know if we have any samples of Zetia 10mg  .. Please Call   Thanks

## 2012-09-08 NOTE — Addendum Note (Signed)
Addended by: Beecher Mcardle R on: 09/08/2012 02:15 PM   Modules accepted: Orders

## 2012-09-20 ENCOUNTER — Other Ambulatory Visit: Payer: Self-pay | Admitting: Oncology

## 2012-09-20 DIAGNOSIS — Z853 Personal history of malignant neoplasm of breast: Secondary | ICD-10-CM

## 2012-10-04 ENCOUNTER — Telehealth: Payer: Self-pay | Admitting: Cardiovascular Disease

## 2012-10-04 NOTE — Telephone Encounter (Signed)
Would like some samples of Zetia 10 mg please. °

## 2012-10-05 MED ORDER — EZETIMIBE 10 MG PO TABS: 10.0000 mg | ORAL_TABLET | Freq: Every day | ORAL | Status: DC

## 2012-10-05 NOTE — Telephone Encounter (Signed)
No fax received.  Call to pt and informed.  Verbalized understanding.  Stated she never received a call back from [place] in office, but has a printout of what was done.  Stated she had a Lipid Panel and CMP.  Pt informed those will be fine, but pt wasn't fasting.  Pt informed Dr. Tresa Endo will likely repeat them since she wasn't fasting and asked pt to bring in a copy to appt.  Pt stated she can log-in the pt portal to access them.  Will try to print or go by office to pick up copy.

## 2012-10-05 NOTE — Telephone Encounter (Signed)
Returned call.  Pt informed message received and appt needed.  Pt verbalized understanding and agreed w/ plan.  Appt scheduled for 9.16.14 at 4pm w/ Dr. Tresa Endo.  Pt has enough samples to last until appt.  Pt stated she had labs drawn yesterday.  Will have them faxed to RN's attention today so any additional labs that Dr. Tresa Endo needs can be ordered and pt can have drawn tomorrow.    Will await fax before ordering labs.  Samples on triage cart and pt to pick up at appt next week.

## 2012-10-05 NOTE — Telephone Encounter (Signed)
Pt is returning your call

## 2012-10-05 NOTE — Telephone Encounter (Signed)
Returned call.  Left message to call back before 4pm.  Pt needs an appt as it has been 1 year since last visit.  Due this month.  Pt will also need labs prior to appt.  Labs will be ordered and samples given to last until appt, if < 28 days.  Will inform pt when call returned.

## 2012-10-06 ENCOUNTER — Ambulatory Visit: Payer: Medicare Other | Admitting: Cardiovascular Disease

## 2012-10-08 ENCOUNTER — Encounter: Payer: Self-pay | Admitting: *Deleted

## 2012-10-10 ENCOUNTER — Ambulatory Visit: Payer: Medicare Other | Admitting: Cardiovascular Disease

## 2012-10-10 ENCOUNTER — Encounter: Payer: Self-pay | Admitting: Cardiovascular Disease

## 2012-10-11 ENCOUNTER — Ambulatory Visit
Admission: RE | Admit: 2012-10-11 | Discharge: 2012-10-11 | Disposition: A | Discharge status: Home / Self Care | Payer: Medicare Other | Source: Ambulatory Visit | Attending: Oncology | Admitting: Oncology

## 2012-10-11 ENCOUNTER — Other Ambulatory Visit: Payer: Self-pay

## 2012-10-11 DIAGNOSIS — Z853 Personal history of malignant neoplasm of breast: Secondary | ICD-10-CM

## 2012-10-11 MED ORDER — ATENOLOL 50 MG PO TABS: 75.0000 mg | ORAL_TABLET | Freq: Every day | ORAL | Status: DC

## 2012-10-11 NOTE — Telephone Encounter (Signed)
Rx was sent to pharmacy electronically. 

## 2012-10-16 ENCOUNTER — Ambulatory Visit: Payer: Medicare Other | Admitting: Cardiovascular Disease

## 2012-11-01 ENCOUNTER — Ambulatory Visit (INDEPENDENT_AMBULATORY_CARE_PROVIDER_SITE_OTHER): Payer: Medicare Other | Admitting: Cardiovascular Disease

## 2012-11-03 ENCOUNTER — Other Ambulatory Visit: Payer: Self-pay | Admitting: Orthopedic Surgery

## 2012-11-03 DIAGNOSIS — M25512 Pain in left shoulder: Secondary | ICD-10-CM

## 2012-11-10 ENCOUNTER — Encounter: Payer: Self-pay | Admitting: Cardiovascular Disease

## 2012-11-10 ENCOUNTER — Ambulatory Visit
Admission: RE | Admit: 2012-11-10 | Discharge: 2012-11-10 | Disposition: A | Discharge status: Home / Self Care | Payer: Medicare Other | Source: Ambulatory Visit | Attending: Cardiovascular Disease | Admitting: Cardiovascular Disease

## 2012-11-10 ENCOUNTER — Ambulatory Visit (INDEPENDENT_AMBULATORY_CARE_PROVIDER_SITE_OTHER): Payer: Medicare Other | Admitting: Cardiovascular Disease

## 2012-11-10 VITALS — BP 110/70 | HR 69 | Ht 62.0 in | Wt 169.4 lb

## 2012-11-10 DIAGNOSIS — C50911 Malignant neoplasm of unspecified site of right female breast: Secondary | ICD-10-CM

## 2012-11-10 DIAGNOSIS — R0789 Other chest pain: Secondary | ICD-10-CM

## 2012-11-10 DIAGNOSIS — F17200 Nicotine dependence, unspecified, uncomplicated: Secondary | ICD-10-CM

## 2012-11-10 DIAGNOSIS — M797 Fibromyalgia: Secondary | ICD-10-CM

## 2012-11-10 DIAGNOSIS — I10 Essential (primary) hypertension: Secondary | ICD-10-CM

## 2012-11-10 DIAGNOSIS — Z87891 Personal history of nicotine dependence: Secondary | ICD-10-CM

## 2012-11-10 DIAGNOSIS — E785 Hyperlipidemia, unspecified: Secondary | ICD-10-CM

## 2012-11-10 DIAGNOSIS — IMO0001 Reserved for inherently not codable concepts without codable children: Secondary | ICD-10-CM

## 2012-11-10 DIAGNOSIS — C50919 Malignant neoplasm of unspecified site of unspecified female breast: Secondary | ICD-10-CM

## 2012-11-10 DIAGNOSIS — Z853 Personal history of malignant neoplasm of breast: Secondary | ICD-10-CM

## 2012-11-10 MED ORDER — ATENOLOL 50 MG PO TABS: 50.0000 mg | ORAL_TABLET | Freq: Every day | ORAL | Status: DC

## 2012-11-10 MED ORDER — EZETIMIBE 10 MG PO TABS: 10.0000 mg | ORAL_TABLET | Freq: Every day | ORAL | Status: DC

## 2012-11-10 NOTE — Patient Instructions (Signed)
A chest x-ray takes a picture of the organs and structures inside the chest, including the heart, lungs, and blood vessels. This test can show several things, including, whether the heart is enlarges; whether fluid is building up in the lungs; and whether pacemaker / defibrillator leads are still in place.  Your physician wants you to follow-up in: 1 year. You will receive a reminder letter in the mail two months in advance. If you don't receive a letter, please call our office to schedule the follow-up appointment.  Try some over the counter ibuprofen for your chest discomfort.  Dr. Tresa Endo asks that you try using your Chantix for smoking cessation.

## 2012-11-11 ENCOUNTER — Ambulatory Visit
Admission: RE | Admit: 2012-11-11 | Discharge: 2012-11-11 | Disposition: A | Discharge status: Home / Self Care | Payer: Medicare Other | Source: Ambulatory Visit | Attending: Orthopedic Surgery | Admitting: Orthopedic Surgery

## 2012-11-11 DIAGNOSIS — M25512 Pain in left shoulder: Secondary | ICD-10-CM

## 2012-11-16 ENCOUNTER — Encounter: Payer: Self-pay | Admitting: Cardiovascular Disease

## 2012-11-16 DIAGNOSIS — M797 Fibromyalgia: Secondary | ICD-10-CM | POA: Insufficient documentation

## 2012-11-16 DIAGNOSIS — Z853 Personal history of malignant neoplasm of breast: Secondary | ICD-10-CM | POA: Insufficient documentation

## 2012-11-16 DIAGNOSIS — F17210 Nicotine dependence, cigarettes, uncomplicated: Secondary | ICD-10-CM | POA: Insufficient documentation

## 2012-11-16 NOTE — Progress Notes (Signed)
Patient ID: Anita Arroyo, female   DOB: [birth year], 52 y.o.   MRN: [id number]     HPI: Anita Arroyo is a 52 y.o. female who presents for one-year cardiology evaluation.  Anita Arroyo is a 52 year old female who I saw last year for evaluation of episodes of chest tightness with walking. Comprehensive assessment was done on 03/30/2011. She has a long-standing history of ongoing tobacco use, and reportedly it had a history of possible mitral valve prolapse. An echo Doppler study in April 2013 showed normal systolic function with grade 1 diastolic dysfunction. She had trace mitral regurgitation and frank mitral valve prolapse was not demonstrated. She did have mild pulmonary hypertension with estimated systolic pressure 34 mm. A nuclear perfusion study revealed normal perfusion.  She does have a history of hyperlipidemia which is treated with Zetia. She has undergone left shoulder surgery by Dr. Eulah Arroyo and had a ruptured by sepsis tendon. 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 Eulah Arroyo, and history of bipolar polar disorder followed by Dr. Ladona Arroyo as well as fibromyalgia.  She recently had laboratory done by Dr. Earl Arroyo which showed a BUN of 10 crit 0.8 normal liver function studies. Her cholesterol was improved on Zetia at 170 with an HDL of 75 triglycerides 117 and LDL of 71.   She does note some musculoskeletal chest pain.   Past Medical History  Diagnosis Date  . Heart murmur   . ADHD (attention deficit hyperactivity disorder)   . Bipolar 1 disorder   . Arthritis   . Meniere disease   . Neuromuscular disorder     fibromyalgia  . Fibromyalgia   . GERD (gastroesophageal reflux disease)   . Dysrhythmia     palpitations  . Depression   . Bipolar 1 disorder   . MVP (mitral valve prolapse)   . Hyperlipemia   . Breast cancer   . Hx of  echocardiogram 04/28/2011    showed normal systolic function with mild diastolic dysfunction,she had trace MR but did not have frank mitral valve prolapse demonstrated. She had mild pulmonary hypertension with an estimated RV systolic pressure at 34 mm.  . History of stress test 04/28/2011    showed normal perfusion without scar or ischemia    Past Surgical History  Procedure Laterality Date  . Tonsillectomy    . Deviated septum    . Ectopic pregnancy surgery    . Tmj arthroplasty    . Uterine fibroid surgery      polups and fibroids removed   . Elbow surgery      right elbow  . Wrist surgery      left  . Ankle surgery      left x4  . Knee surgery      left knee  . Fibroid      2-3 fibroid adenomas removed  . Joint replacement  2011    right knee   . Breast surgery  2011    lumpectomy right breast     Allergies  Allergen Reactions  . Contrast Media [Iodinated Diagnostic Agents] Shortness Of Breath  . Adhesive [Tape]     blister  . Amoxicillin-Pot Clavulanate   . Iodine   . Moxifloxacin   . Sulfonamide Derivatives   . Latex     nausea    Current Outpatient Prescriptions  Medication Sig Dispense  Refill  . atenolol (TENORMIN) 50 MG tablet Take 1 tablet (50 mg total) by mouth daily. 1 tablet in the AM and 1/2 tablet in the PM  135 tablet  3  . Azelastine HCl (ASTEPRO) 0.15 % SOLN by Nasal route as directed.        . Azelastine-Fluticasone (DYMISTA NA) Place into the nose.      . cetirizine (ZYRTEC) 10 MG tablet Take by mouth as directed.        . Emollient (NEOSALUS CP) CREA Apply 1 application topically daily as needed.      . ezetimibe (ZETIA) 10 MG tablet Take 1 tablet (10 mg total) by mouth daily.  28 tablet  0  . fluticasone (FLONASE) 50 MCG/ACT nasal spray by Nasal route as directed.        Marland Kitchen HYDROcodone-acetaminophen (NORCO/VICODIN) 5-325 MG per tablet       . lamoTRIgine (LAMICTAL) 150 MG tablet Take 150 mg by mouth 2 (two) times daily.       . pantoprazole  (PROTONIX) 40 MG tablet Take 1 tablet by mouth daily.      . tamoxifen (NOLVADEX) 20 MG tablet Take 1 tablet (20 mg total) by mouth daily.  30 tablet  12  . traZODone (DESYREL) 100 MG tablet Take 100 mg by mouth as directed. Takes 2      . triamterene-hydrochlorothiazide (DYAZIDE) 37.5-25 MG per capsule Take by mouth as directed.        . venlafaxine (EFFEXOR-XR) 150 MG 24 hr capsule Take 150 mg by mouth daily.        No current facility-administered medications for this visit.    History   Social History  . Marital Status: Single    Spouse Name: N/A    Number of Children: N/A  . Years of Education: N/A   Occupational History  . Not on file.   Social History Main Topics  . Smoking status: Current Every Day Smoker -- 1.50 packs/day for 30 years    Types: Cigarettes  . Smokeless tobacco: Never Used  . Alcohol Use: Yes     Comment: occ  . Drug Use: No  . Sexual Activity: Not on file   Other Topics Concern  . Not on file   Social History Narrative  . No narrative on file    Family History  Problem Relation Age of Onset  . Mitral valve prolapse Mother   . Heart Problems Mother   . Cancer Mother    Socially she is single. She has no children. She has been smoking for approximately 37 years and still smokes one pack per day per. 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  ROS is negative for fevers, chills or night sweats. She does note an occasional cough. She does admit to some chest wall discomfort. She notes her palpitations She denies wheezing. She denies change of bowel or bladder habits. She is unaware of any blood in her stool or urine. She denies claudication symptoms. She has had chronic joint issues with need for knee replacement as well as shoulder surgery.   Other comprehensive 12 point system review is negative.  PE BP 110/70  Pulse 69  Ht 5\' 2"  (1.575 m)  Wt 169 lb 6.4 oz (76.839 kg)  BMI 30.98 kg/m2  General: Alert, oriented, no distress.    Skin: normal turgor, no rashes HEENT: Normocephalic, atraumatic. Pupils round and reactive; sclera anicteric;no lid lag.  Nose without nasal septal hypertrophy Mouth/Parynx benign;  Mallinpatti scale 3 Neck: No JVD, no carotid briuts Lungs: clear to ausculatation and percussion; no wheezing or rales Chest wall: Mild left costochondral tenderness to palpation  Heart: RRR, s1 s2 normal 1/6 systolic murmur; no audible click. Abdomen: soft, nontender; no hepatosplenomehaly, BS+; abdominal aorta nontender and not dilated by palpation. Pulses 2+ Extremities: no clubbing cyanosis or edema, Homan's sign negative  Neurologic: grossly nonfocal Psychologic: normal affect and mood.  ECG: Sinus rhythm at 69 beats per minute. Mild RV conduction delay. Normal intervals.  LABS:  BMET    Component Value Date/Time   NA 139 06/29/2012 1221   NA 141 10/12/2011 1555   K 4.4 06/29/2012 1221   K 3.7 10/12/2011 1555   CL 104 06/29/2012 1221   CL 102 10/12/2011 1555   CO2 26 06/29/2012 1221   CO2 29 10/12/2011 1555   GLUCOSE 107* 06/29/2012 1221   GLUCOSE 95 10/12/2011 1555   BUN 21.4 06/29/2012 1221   BUN 14 10/12/2011 1555   CREATININE 0.8 06/29/2012 1221   CREATININE 0.87 10/12/2011 1555   CALCIUM 9.4 06/29/2012 1221   CALCIUM 9.6 10/12/2011 1555   GFRNONAA 75* 10/12/2011 1555   GFRAA 87* 10/12/2011 1555     Hepatic Function Panel     Component Value Date/Time   PROT 7.1 06/29/2012 1221   PROT 6.6 06/30/2010 1338   ALBUMIN 3.9 06/29/2012 1221   ALBUMIN 4.3 06/30/2010 1338   AST 22 06/29/2012 1221   AST 25 06/30/2010 1338   ALT 16 06/29/2012 1221   ALT 17 06/30/2010 1338   ALKPHOS 77 06/29/2012 1221   ALKPHOS 65 06/30/2010 1338   BILITOT 0.25 06/29/2012 1221   BILITOT 0.3 06/30/2010 1338     CBC    Component Value Date/Time   WBC 6.1 06/29/2012 1221   WBC 5.6 02/20/2010 0340   RBC 4.39 06/29/2012 1221   RBC 2.88* 02/20/2010 0340   HGB 14.2 06/29/2012 1221   HGB 15.3* 10/14/2011 0814   HCT 41.2 06/29/2012 1221   HCT 27.0*  02/20/2010 0340   PLT 218 06/29/2012 1221   PLT 146* 02/20/2010 0340   MCV 93.8 06/29/2012 1221   MCV 93.8 02/20/2010 0340   MCH 32.3 06/29/2012 1221   MCH 31.3 02/20/2010 0340   MCHC 34.5 06/29/2012 1221   MCHC 33.3 02/20/2010 0340   RDW 13.3 06/29/2012 1221   RDW 13.0 02/20/2010 0340   LYMPHSABS 1.8 06/29/2012 1221   LYMPHSABS 1.6 06/02/2009 0928   MONOABS 0.5 06/29/2012 1221   MONOABS 0.4 06/02/2009 0928   EOSABS 0.1 06/29/2012 1221   EOSABS 0.1 06/02/2009 0928   BASOSABS 0.0 06/29/2012 1221   BASOSABS 0.0 06/02/2009 0928     BNP No results found for this basename: probnp    Lipid Panel  No results found for this basename: chol, trig, hdl, cholhdl, vldl, ldlcalc     RADIOLOGY: Dg Chest 2 View  11/10/2012   CLINICAL DATA:  Cough and chest pain  EXAM: CHEST  2 VIEW  COMPARISON:  July 30, 2012  FINDINGS: There is a calcified granuloma in the left lower lobe. Lungs are otherwise clear. Heart size and pulmonary vascularity are normal. No adenopathy. There are no appreciable bone lesions.  IMPRESSION: Granuloma left base. No edema or consolidation   Electronically Signed   By: Bretta Bang M.D.   On: 11/10/2012 15:22   Mr Shoulder Left Wo Contrast  11/12/2012   CLINICAL DATA:  Extreme shoulder pain for months. Prior shoulder surgery  approximately 1 year ago.  EXAM: MRI OF THE LEFT SHOULDER WITHOUT CONTRAST  TECHNIQUE: Multiplanar, multisequence MR imaging of the shoulder was performed. No intravenous contrast was administered.  COMPARISON:  Left shoulder MRI 06/04/2011.  FINDINGS: Rotator cuff: Interval development of infraspinous tendinosis with hyperintensity extending to the musculotendinous junction. No focal tear is demonstrated. There is stable mild supraspinatus and subscapularis tendinosis.  Muscles:  No focal muscular atrophy or edema.  Biceps long head: The intra-articular portion of the biceps tendon is now diminutive with irregular signal consistent with tendinosis and partial tearing. No  full-thickness tendon tear or tendon dislocation is identified.  Acromioclavicular Joint: The acromion is type 1. Patient has undergone interval distal clavicle resection and probable acromioplasty. There is no osseous encroachment on the rotator cuff passageway. There is no significant fluid in the subacromial -subdeltoid space.  Glenohumeral Joint: No significant shoulder joint effusion. The previously demonstrated edema and cystic changes superiorly in the glenoid have improved. However, there is new edema and subchondral cyst formation in the anterior inferior glenoid.  Labrum: There is labral degeneration without evidence of discrete tear.  Bones: No significant extra-articular osseous findings. The cystic changes previously noted medial to the lesser tuberosity have improved.  IMPRESSION: 1. Interval distal clavicle resection and acromioplasty. No rotator cuff impingement identified. 2. Infraspinous tendinosis without evidence of rotator cuff tear. 3. New diminution of the long head of the biceps tendon consistent with partial tearing. 4. Progressive glenohumeral degenerative changes with new subchondral cyst formation and edema in the anterior inferior glenoid. No discrete labral tear identified.   Electronically Signed   By: Roxy Horseman M.D.   On: 11/12/2012 12:14      ASSESSMENT AND PLAN: Impression is that Ms Swamy is a 52 year old female with a long history of tobacco use. She previously had developed episodes of chest discomfort. Perfusion study done last year was normal. Her blood pressure today is well controlled. She recently underwent an MRI of her left shoulder by Dr. Eulah Arroyo. With a long-standing tobacco history, I am recommending that she undergo followup chest x-ray 2 history of previous granuloma and ongoing tobacco use. She notes rare palpitations. I strongly encouraged smoking cessation. She has a prescription for Chantix at home I suggest she try instituting this therapy. I suggested  when necessary nonsteroidal anti-inflammatory medicine for her chest wall discomfort. I will see her in one year for followup cardiology evaluation or sooner if problems arise.     Lennette Bihari, MD, [facility]  11/16/2012 6:12 PM

## 2012-11-22 NOTE — Progress Notes (Signed)
This is a pre-procedure CXR. Dr. Tresa Endo aware of findings. Radiologist discussed findings with the patient.

## 2012-11-28 ENCOUNTER — Ambulatory Visit (INDEPENDENT_AMBULATORY_CARE_PROVIDER_SITE_OTHER): Payer: Self-pay | Admitting: General Surgery

## 2012-12-26 ENCOUNTER — Telehealth: Payer: Self-pay | Admitting: Cardiovascular Disease

## 2012-12-26 NOTE — Telephone Encounter (Signed)
Returned call.  Left message on business phone to call back before 4pm.  No samples available.  Will inform when call returned.

## 2012-12-26 NOTE — Telephone Encounter (Signed)
Pt called back and verified x 2.  Pt informed there are no samples of Zetia available.  Advised she call back on Friday or Monday.  Pt verbalized understanding and agreed w/ plan.

## 2012-12-26 NOTE — Telephone Encounter (Signed)
Would like samples of Zetia 10 mg please. °

## 2013-01-16 ENCOUNTER — Other Ambulatory Visit (HOSPITAL_COMMUNITY)
Admission: RE | Admit: 2013-01-16 | Discharge: 2013-01-16 | Disposition: A | Discharge status: Home / Self Care | Payer: Medicare Other | Source: Ambulatory Visit | Attending: Obstetrics and Gynecology | Admitting: Obstetrics and Gynecology

## 2013-01-16 ENCOUNTER — Other Ambulatory Visit: Payer: Self-pay | Admitting: Obstetrics and Gynecology

## 2013-01-16 ENCOUNTER — Telehealth: Payer: Self-pay | Admitting: Cardiovascular Disease

## 2013-01-16 DIAGNOSIS — Z124 Encounter for screening for malignant neoplasm of cervix: Secondary | ICD-10-CM | POA: Insufficient documentation

## 2013-01-16 DIAGNOSIS — Z1151 Encounter for screening for human papillomavirus (HPV): Secondary | ICD-10-CM | POA: Insufficient documentation

## 2013-01-16 MED ORDER — EZETIMIBE 10 MG PO TABS: 10.0000 mg | ORAL_TABLET | Freq: Every day | ORAL | Status: DC

## 2013-01-16 NOTE — Telephone Encounter (Signed)
Would like some samples of Zetia 10 mg please. °

## 2013-01-16 NOTE — Telephone Encounter (Signed)
Returned call.  Left message that samples available and to call back before 4pm if questions.  No pt identifiers left on voicemail.

## 2013-02-19 ENCOUNTER — Telehealth: Payer: Self-pay | Admitting: *Deleted

## 2013-02-19 MED ORDER — EZETIMIBE 10 MG PO TABS: 10.0000 mg | ORAL_TABLET | Freq: Every day | ORAL | Status: DC

## 2013-02-19 NOTE — Telephone Encounter (Signed)
Pt was calling in regards to getting some Zetia 10 mg Samples.   TK

## 2013-02-19 NOTE — Telephone Encounter (Signed)
Returned call and pt informed samples left at front desk.  Pt verbalized understanding and agreed w/ plan.    

## 2013-04-04 ENCOUNTER — Telehealth: Payer: Self-pay | Admitting: Cardiovascular Disease

## 2013-04-04 MED ORDER — EZETIMIBE 10 MG PO TABS: 10.0000 mg | ORAL_TABLET | Freq: Every day | ORAL | Status: DC

## 2013-04-04 NOTE — Telephone Encounter (Signed)
Is asking for samples of Zetia 10mg .. Please Call    Thaks

## 2013-04-04 NOTE — Telephone Encounter (Signed)
Samples left at front desk - patient notified

## 2013-05-17 ENCOUNTER — Telehealth: Payer: Self-pay | Admitting: Cardiovascular Disease

## 2013-05-17 NOTE — Telephone Encounter (Signed)
Spoke to patient. RN informed patient no samples available at present.  RN informed patient to call early part of next month.Verbalized understanding.

## 2013-05-17 NOTE — Telephone Encounter (Signed)
Would like some samples of Zetia 10 mg please. °

## 2013-06-21 ENCOUNTER — Encounter (HOSPITAL_COMMUNITY): Payer: Self-pay | Admitting: Psychiatry

## 2013-06-21 ENCOUNTER — Encounter (INDEPENDENT_AMBULATORY_CARE_PROVIDER_SITE_OTHER): Payer: Self-pay

## 2013-06-21 ENCOUNTER — Ambulatory Visit (INDEPENDENT_AMBULATORY_CARE_PROVIDER_SITE_OTHER): Payer: Commercial Managed Care - HMO | Admitting: Psychiatry

## 2013-06-21 VITALS — BP 111/66 | HR 68 | Ht 62.5 in | Wt 176.6 lb

## 2013-06-21 DIAGNOSIS — F319 Bipolar disorder, unspecified: Secondary | ICD-10-CM

## 2013-06-21 MED ORDER — VENLAFAXINE HCL ER 150 MG PO CP24: 150.0000 mg | ORAL_CAPSULE | Freq: Every day | ORAL | Status: DC

## 2013-06-21 MED ORDER — LAMOTRIGINE 150 MG PO TABS: 150.0000 mg | ORAL_TABLET | Freq: Two times a day (BID) | ORAL | Status: DC

## 2013-06-21 MED ORDER — TRAZODONE HCL 100 MG PO TABS: ORAL_TABLET | ORAL | Status: DC

## 2013-06-21 NOTE — Progress Notes (Signed)
[facility] [facility] Initial Assessment Note  NINETTE COTTA [id number] 53 y.o.  06/21/2013 3:54 PM  Chief Complaint:  I do not like [facility].  I want to see a new doctor.  I am not happy with [facility].  History of Present Illness:  Patient is a 53 year old single, unemployed, Caucasian female who has a long history of bipolar disorder came for her initial evaluation.  Patient does not want to continue her care at [facility].  She is not happy with the provider.  She is taking Effexor, Lamictal and trazodone.  She wants to continue her current psychotropic medication.  She reported that her medicines are working very well and she does not like to change the medication.  Patient endorses history of bipolar disorder for more than 15 years.  Patient still has symptoms of highs and lows, irritability, mood swings but she believes it is mostly because of finances.  The patient is on disability and she has limited resources.  She endorsed some time she is unable to pay utility bills, co-pays and that causes irritability and anxiety.  Her insurance changed from January.  Patient has multiple health issues.  She sees multiple doctor.  Patient denies any hallucination, paranoia, suicidal thoughts or homicidal thoughts.  She also has diagnoses of ADHD which is given by psychiatrist at [facility] however when she tried stimulant it causes worsening of the bipolar symptoms.  Patient reported sometimes racing thoughts, nervousness and she worries about the future and denies any aggression, violence and impulsive behavior.  She endorsed some time crying spells, lack of motivation, social isolation that she mostly attributed to finances.  Patient is currently not seeing any therapist because she cannot afford co-pay.  She denies any history of sexual, physical or verbal abuse.  She denies any panic attack, obsessive-compulsive thoughts or any feeling of hopelessness or worthlessness.  She has no tremors, side effects or any  concern about the medication.  There has been no changes in her appetite, weight and her vitals are stable.  Suicidal Ideation: No Plan Formed: No Patient has means to carry out plan: No  Homicidal Ideation: No Plan Formed: No Patient has means to carry out plan: No  Past Psychiatric History/Hospitalization(s) Patient number that she was diagnosed with bipolar disorder more than 15 years ago.  At that time she was having a lot of mood swings, anger and impulsive behavior.  She remember highs and lows in her mood and getting easily distracted with poor sleep.  She had tried Depakote, Topamax, Prozac, Paxil, Zoloft however she's been taking her current psychotropic medication for more than 10 years and she does not want to change it.  She was seen psychiatrist initially at [facility] and then it was changed to [facility].  Patient denies any history of psychosis or paranoia but endorses mania and severe depression.  She has never been admitted to a psychiatric hospital.  She has never tried to kill herself.  She has no history of physical, sexual or verbal abuse.  She was given medication for ADD however a stimulant cause worsening of bipolar symptoms.  She had tried Ritalin and Adderall. Anxiety: Yes Bipolar Disorder: Yes Depression: Yes Mania: Yes Psychosis: No Schizophrenia: No Personality Disorder: No Hospitalization for psychiatric illness: No History of Electroconvulsive Shock Therapy: No Prior Suicide Attempts: No  Medical History; She has multiple medical problems.  She has fibromyalgia, GERD, breast cancer, hyperlipidemia, right knee replacement, shoulder pain and heart murmur.  Her primary care  physician is Dr. Nehemiah Settle.  Traumatic brain injury: She denies any history of dramatic brain injury.  Family History; She is unaware about any family history.  She was adopted when she was very young.  Education and Work History; Patient finished her high school.  She  has some college but she dropped out .  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  Psychosocial History; Patient was born and raised in [place].  She never married.  She has no children.  She lives with her 4 cats.  She has started communicating with her biological mother few years ago but she is very close to her adopted mother.  She calls her biological mother "Janifer Adie" and her adopted mother "mother".  Patient has never seen her father.    Legal History; Patient denies any current legal problems or issues.  History Of Abuse; Patient denies any history of abuse.  Substance Abuse History; She endorses history of heavy drinking and using drugs in the past when she was in the college .  However she claims to be sober from drugs.  She claimed that she is a social drinker.  She denies any binge drinking, withdrawals, blackouts or any tremors.  Review of Systems: Psychiatric: Agitation: No Hallucination: No Depressed Mood: No Insomnia: No Hypersomnia: No Altered Concentration: No Feels Worthless: No Grandiose Ideas: No Belief In Special Powers: No New/Increased Substance Abuse: No Compulsions: No  Neurologic: Headache: No Seizure: No Paresthesias: No    Outpatient Encounter Prescriptions as of 06/21/2013  Medication Sig  . ADVAIR DISKUS 250-50 MCG/DOSE AEPB   . atenolol (TENORMIN) 50 MG tablet Take 1 tablet (50 mg total) by mouth daily. 1 tablet in the AM and 1/2 tablet in the PM  . Azelastine HCl (ASTEPRO) 0.15 % SOLN by Nasal route as directed.    . Azelastine-Fluticasone (DYMISTA NA) Place into the nose.  . cetirizine (ZYRTEC) 10 MG tablet Take by mouth as directed.    . Emollient (NEOSALUS CP) CREA Apply 1 application topically daily as needed.  . ezetimibe (ZETIA) 10 MG tablet Take 1 tablet (10 mg total) by mouth daily.  Marland Kitchen HYDROcodone-acetaminophen (NORCO/VICODIN) 5-325 MG per tablet   . lamoTRIgine (LAMICTAL) 150 MG tablet Take 1 tablet  (150 mg total) by mouth 2 (two) times daily.  . pantoprazole (PROTONIX) 40 MG tablet Take 1 tablet by mouth daily.  . tamoxifen (NOLVADEX) 20 MG tablet Take 1 tablet (20 mg total) by mouth daily.  . traZODone (DESYREL) 100 MG tablet Takes 2 tab at bed time  . triamterene-hydrochlorothiazide (DYAZIDE) 37.5-25 MG per capsule Take by mouth as directed.    . venlafaxine XR (EFFEXOR-XR) 150 MG 24 hr capsule Take 1 capsule (150 mg total) by mouth daily.  . [DISCONTINUED] fluticasone (FLONASE) 50 MCG/ACT nasal spray by Nasal route as directed.    . [DISCONTINUED] lamoTRIgine (LAMICTAL) 150 MG tablet Take 150 mg by mouth 2 (two) times daily.   . [DISCONTINUED] traZODone (DESYREL) 100 MG tablet Take 100 mg by mouth as directed. Takes 2  . [DISCONTINUED] venlafaxine (EFFEXOR-XR) 150 MG 24 hr capsule Take 150 mg by mouth daily.     No results found for this or any previous visit (from the past 2160 hour(s)).    Physical Exam: Constitutional:  BP 111/66  Pulse 68  Ht 5' 2.5" (1.588 m)  Wt 176 lb 9.6 oz (80.105 kg)  BMI 31.77 kg/m2  Musculoskeletal: Strength & Muscle Tone: within normal limits Gait &  Station: normal Patient leans: N/A  Mental Status Examination;  Patient is casually dressed and fairly groomed.  She appears to be her stated age.  She described her mood is depressed and anxious and her affect is labile.  She has difficult at times to concentrate in her thinking.  She gets easily distracted.  Her thought processes circumstantial.  She denies any auditory or visual hallucination.  She denies any active or passive suicidal thoughts or homicidal thoughts.  There were no delusions, paranoia or any obsessive thoughts.  Attention and concentration is distracted.  She is alert and oriented x3.  Her psychomotor activity is slightly increased.  Her fund of knowledge is adequate.  Her insight judgment and impulse control is okay.   New problem, with additional work up planned, Review of  Psycho-Social Stressors (1), Review or order clinical lab tests (1), Decision to obtain old records (1), Review and summation of old records (2), Review of Medication Regimen & Side Effects (2) and Review of New Medication or Change in Dosage (2)  Assessment: Axis I: Bipolar disorder type I, ADHD by history  Axis II: Deferred  Axis III:  Past Medical History  Diagnosis Date  . Heart murmur   . ADHD (attention deficit hyperactivity disorder)   . Bipolar 1 disorder   . Arthritis   . Meniere disease   . Neuromuscular disorder     fibromyalgia  . Fibromyalgia   . GERD (gastroesophageal reflux disease)   . Dysrhythmia     palpitations  . Depression   . Bipolar 1 disorder   . MVP (mitral valve prolapse)   . Hyperlipemia   . Breast cancer   . Hx of echocardiogram 04/28/2011    showed normal systolic function with mild diastolic dysfunction,she had trace MR but did not have frank mitral valve prolapse demonstrated. She had mild pulmonary hypertension with an estimated RV systolic pressure at 34 mm.  . History of stress test 04/28/2011    showed normal perfusion without scar or ischemia    Axis IV: Mild to moderate   Plan:  Patient is a 53 year old Caucasian female who has long history of bipolar disorder.  She wants to start her treatment and care in this office because she is not happy with [facility].  She does not want to change her psychotropic medication.  She is taking Lamictal 150 mg 2 tablet daily, Effexor 150 mg daily and trazodone 200 mg at bedtime.  Despite she has residual symptoms of mood lability she does not want to increase the dosage.  She does not want to see therapist as she cannot afford counseling.  She has no side effects including any rash or itching.  I review her psychosocial stressors, current medication and all records from [place].  At this time I will continue her current psychotropic medication however I recommended that she feels that medicine is not working  then she should call us to adjust the dose.  The patient liked to get refills of her medication for 90 days.  I recommended to call us back if she has any question or any concern.  Patient is scheduled to see her primary care physician in 2 months for routine blood work.  We will followup with her blood work.  I will see her again in 4 weeks. Time spent 55 minutes.  More than 50% of the time spent in psychoeducation, counseling and coordination of care.  Discuss safety plan that anytime having active suicidal thoughts or homicidal thoughts  then patient need to call 911 or go to the local emergency room.    Donyea Beverlin T., MD 06/21/2013

## 2013-07-04 ENCOUNTER — Telehealth: Payer: Self-pay | Admitting: Cardiovascular Disease

## 2013-07-04 MED ORDER — EZETIMIBE 10 MG PO TABS: 10.0000 mg | ORAL_TABLET | Freq: Every day | ORAL | Status: DC

## 2013-07-04 NOTE — Telephone Encounter (Signed)
Pt would like some samples of Zetia 10 mg please. °

## 2013-07-04 NOTE — Telephone Encounter (Signed)
Patient notified samples are at front desk

## 2013-07-05 ENCOUNTER — Other Ambulatory Visit: Payer: Self-pay | Admitting: *Deleted

## 2013-07-05 DIAGNOSIS — C50919 Malignant neoplasm of unspecified site of unspecified female breast: Secondary | ICD-10-CM

## 2013-07-05 MED ORDER — TAMOXIFEN CITRATE 20 MG PO TABS: 20.0000 mg | ORAL_TABLET | Freq: Every day | ORAL | Status: DC

## 2013-07-10 ENCOUNTER — Telehealth: Payer: Self-pay | Admitting: *Deleted

## 2013-07-10 NOTE — Telephone Encounter (Signed)
Spoke with patient and rescheduled her appointment with Dr. Jana Hakim to Dr. Grayland Ormond for 07/17/13 at 1230 labs and 1pm with Dr. Grayland Ormond.

## 2013-07-16 ENCOUNTER — Ambulatory Visit: Payer: Self-pay

## 2013-07-16 ENCOUNTER — Telehealth: Payer: Self-pay | Admitting: *Deleted

## 2013-07-16 NOTE — Telephone Encounter (Signed)
Received call from patient and needs to reschedule due to she forgot she another appointment today. Confirmed new appointment for 07/31/13 at 11am for labs and Dr. Lona Kettle at 1130.

## 2013-07-17 ENCOUNTER — Other Ambulatory Visit: Payer: Medicare Other

## 2013-07-17 ENCOUNTER — Ambulatory Visit: Payer: Self-pay | Admitting: Oncology

## 2013-07-18 ENCOUNTER — Other Ambulatory Visit: Payer: Self-pay | Admitting: Physician Assistant

## 2013-07-18 DIAGNOSIS — C50919 Malignant neoplasm of unspecified site of unspecified female breast: Secondary | ICD-10-CM

## 2013-07-19 ENCOUNTER — Ambulatory Visit (INDEPENDENT_AMBULATORY_CARE_PROVIDER_SITE_OTHER): Payer: Commercial Managed Care - HMO | Admitting: Psychiatry

## 2013-07-19 ENCOUNTER — Encounter (HOSPITAL_COMMUNITY): Payer: Self-pay | Admitting: Psychiatry

## 2013-07-19 VITALS — BP 125/62 | HR 70 | Ht 62.5 in | Wt 177.6 lb

## 2013-07-19 DIAGNOSIS — F319 Bipolar disorder, unspecified: Secondary | ICD-10-CM

## 2013-07-19 NOTE — Progress Notes (Signed)
[facility] [place] Progress Note   Anita Arroyo [id number] 53 y.o.  07/19/2013 2:15 PM  Chief Complaint:  Medication management of followup.  History of Present Illness:  Making for her followup appointment.  She was seen first time on May 28 his initial evaluation.  She does self referred for the management of her psychiatric illness.  She was going to [facility] but she was not happy there.  She has a long history of bipolar disorder and she is taking her Effexor, Lamictal and trazodone for more than 15 years.  She does not want to change her medication.  She admitted sometime irritability and anger but she believes it is because of her financial stress.  She is on disability but sometimes she do dog walking.  She endorse last week was sad because she remembers Father's Day.  She had never seen her biological father.  She is raised by adoptive parents.  Her adopted father deceased in 04/12/06.  Patient denies any anger, hallucinations, paranoia, active or passive suicidal thoughts or homicidal thoughts.  She endorse sometimes sweating but she does not want to change her psychiatric medication.  She has no tremors or shakes.  Her sleep is good.  Her appetite is okay.  She lived by herself.  She has no children.  She had a good relationship with her adopted and biological mother.  Suicidal Ideation: No Plan Formed: No Patient has means to carry out plan: No  Homicidal Ideation: No Plan Formed: No Patient has means to carry out plan: No  Past Psychiatric History/Hospitalization(s) Patient endorse history of bipolar disorder .  She remembered having mood swings, anger, impulsive behavior.  She had tried Depakote, Prozac, Paxil, Zoloft and Topamax.  She was seen at [facility] for more than 10 years but she was not happy there.  She denies any history of psychosis, paranoia but endorsed history of severe mania and depression.  Patient denies any history of physical, sexual or verbal abuse.  She  is also given the diagnosis of ADD however she has noticed a stimulant cause worsening of her bipolar disorder.  She had tried Ritalin and Adderall in the past. Anxiety: Yes Bipolar Disorder: Yes Depression: Yes Mania: Yes Psychosis: No Schizophrenia: No Personality Disorder: No Hospitalization for psychiatric illness: No History of Electroconvulsive Shock Therapy: No Prior Suicide Attempts: No  Medical History; She has multiple medical problems.  She has fibromyalgia, GERD, breast cancer, hyperlipidemia, right knee replacement, shoulder pain and heart murmur.  Her primary care physician is Dr. Nehemiah Settle.  Psychosocial History; Patient was born and raised in [place].  She never married.  She has no children.  She lives with her 4 cats.  She has started communicating with her biological mother few years ago but she is very close to her adopted mother.  She calls her biological mother "Janifer Adie" and her adopted mother "mother".  Patient has never seen her father.    Review of Systems: Psychiatric: Agitation: No Hallucination: No Depressed Mood: No Insomnia: No Hypersomnia: No Altered Concentration: No Feels Worthless: No Grandiose Ideas: No Belief In Special Powers: No New/Increased Substance Abuse: No Compulsions: No  Neurologic: Headache: No Seizure: No Paresthesias: No    Outpatient Encounter Prescriptions as of 07/19/2013  Medication Sig  . ADVAIR DISKUS 250-50 MCG/DOSE AEPB   . atenolol (TENORMIN) 50 MG tablet Take 1 tablet (50 mg total) by mouth daily. 1 tablet in the AM and 1/2 tablet in the PM  . Azelastine  HCl (ASTEPRO) 0.15 % SOLN by Nasal route as directed.    . Azelastine-Fluticasone (DYMISTA NA) Place into the nose.  . cetirizine (ZYRTEC) 10 MG tablet Take by mouth as directed.    . Emollient (NEOSALUS CP) CREA Apply 1 application topically daily as needed.  . ezetimibe (ZETIA) 10 MG tablet Take 1 tablet (10 mg total) by mouth daily.  Marland Kitchen  HYDROcodone-acetaminophen (NORCO/VICODIN) 5-325 MG per tablet   . lamoTRIgine (LAMICTAL) 150 MG tablet Take 1 tablet (150 mg total) by mouth 2 (two) times daily.  . pantoprazole (PROTONIX) 40 MG tablet Take 1 tablet by mouth daily.  . tamoxifen (NOLVADEX) 20 MG tablet Take 1 tablet (20 mg total) by mouth daily.  . traZODone (DESYREL) 100 MG tablet Takes 2 tab at bed time  . triamterene-hydrochlorothiazide (DYAZIDE) 37.5-25 MG per capsule Take by mouth as directed.    . venlafaxine XR (EFFEXOR-XR) 150 MG 24 hr capsule Take 1 capsule (150 mg total) by mouth daily.    No results found for this or any previous visit (from the past 2160 hour(s)).    Physical Exam: Constitutional:  BP 125/62  Pulse 70  Ht 5' 2.5" (1.588 m)  Wt 177 lb 9.6 oz (80.559 kg)  BMI 31.95 kg/m2  Musculoskeletal: Strength & Muscle Tone: within normal limits Gait & Station: normal Patient leans: N/A  Mental Status Examination;  Patient is casually dressed and fairly groomed.  She appears to be her stated age.  She described her mood is sad and her affect is mood appropriate.  Her attention and concentration is fair.  Her speech is fast but coherent.  Her thought process is circumstantial.  She denies any auditory or visual hallucination.  She denies any active or passive suicidal thoughts or homicidal thoughts.  There were no delusions, paranoia or any obsessive thoughts. She is alert and oriented x3.  Her psychomotor activity is slightly increased.  Her fund of knowledge is adequate.  Her insight judgment and impulse control is okay.   Review of Psycho-Social Stressors (1), Review or order clinical lab tests (1), Review and summation of old records (2), Review of Last Therapy Session (1), Review of Medication Regimen & Side Effects (2) and Review of New Medication or Change in Dosage (2)  Assessment: Axis I: Bipolar disorder type I, ADHD by history  Axis II: Deferred  Axis III:  Past Medical History  Diagnosis  Date  . Heart murmur   . ADHD (attention deficit hyperactivity disorder)   . Bipolar 1 disorder   . Arthritis   . Meniere disease   . Neuromuscular disorder     fibromyalgia  . Fibromyalgia   . GERD (gastroesophageal reflux disease)   . Dysrhythmia     palpitations  . Depression   . Bipolar 1 disorder   . MVP (mitral valve prolapse)   . Hyperlipemia   . Breast cancer   . Hx of echocardiogram 04/28/2011    showed normal systolic function with mild diastolic dysfunction,she had trace MR but did not have frank mitral valve prolapse demonstrated. She had mild pulmonary hypertension with an estimated RV systolic pressure at 34 mm.  . History of stress test 04/28/2011    showed normal perfusion without scar or ischemia    Axis IV: Mild to moderate   Plan:  Patient is doing better on her current psychotropic medication.  She does not want to change her medication.  She has some sweating which could be due to Effexor but she  does not want to try a different medication.  She wants to come every 3 months because of financial strain.  She is not interested in counseling.  I reviewed the records from [place].  I will continue Effexor 150 mg daily, trazodone 200 mg daily and Lamictal 150 mg twice a day.  She does not have any rash or itching.  Recommended to call us back if she has any question or any concern.  I will see her again in 3 months. Time spent 25 minutes.  More than 50% of the time spent in psychoeducation, counseling and coordination of care.  Discuss safety plan that anytime having active suicidal thoughts or homicidal thoughts then patient need to call 911 or go to the local emergency room.  ARFEEN,SYED T., MD 07/19/2013

## 2013-07-26 ENCOUNTER — Telehealth: Payer: Self-pay | Admitting: *Deleted

## 2013-07-26 NOTE — Telephone Encounter (Signed)
Spoke to pt concerning f/u appt on 7/7. Informed pt Dr. Lona Kettle will not be here until the following week. Change appt to 08/06/13 at 1:30/2:00. Pt denies further needs at this time.

## 2013-07-31 ENCOUNTER — Other Ambulatory Visit: Payer: Self-pay

## 2013-07-31 ENCOUNTER — Ambulatory Visit: Payer: Self-pay

## 2013-08-06 ENCOUNTER — Telehealth: Payer: Self-pay | Admitting: Cardiovascular Disease

## 2013-08-06 ENCOUNTER — Encounter: Payer: Self-pay | Admitting: General Practice

## 2013-08-06 ENCOUNTER — Other Ambulatory Visit (HOSPITAL_BASED_OUTPATIENT_CLINIC_OR_DEPARTMENT_OTHER): Payer: Commercial Managed Care - HMO

## 2013-08-06 ENCOUNTER — Encounter: Payer: Self-pay | Admitting: Hematology

## 2013-08-06 ENCOUNTER — Other Ambulatory Visit: Payer: Self-pay | Admitting: *Deleted

## 2013-08-06 ENCOUNTER — Ambulatory Visit (HOSPITAL_BASED_OUTPATIENT_CLINIC_OR_DEPARTMENT_OTHER): Payer: Commercial Managed Care - HMO | Admitting: Hematology

## 2013-08-06 VITALS — BP 139/76 | HR 66 | Temp 98.4°F | Resp 18 | Ht 62.5 in | Wt 175.6 lb

## 2013-08-06 DIAGNOSIS — Z170 Estrogen receptor positive status [ER+]: Secondary | ICD-10-CM

## 2013-08-06 DIAGNOSIS — D0590 Unspecified type of carcinoma in situ of unspecified breast: Secondary | ICD-10-CM

## 2013-08-06 DIAGNOSIS — F319 Bipolar disorder, unspecified: Secondary | ICD-10-CM

## 2013-08-06 DIAGNOSIS — C50919 Malignant neoplasm of unspecified site of unspecified female breast: Secondary | ICD-10-CM

## 2013-08-06 DIAGNOSIS — F909 Attention-deficit hyperactivity disorder, unspecified type: Secondary | ICD-10-CM

## 2013-08-06 DIAGNOSIS — R61 Generalized hyperhidrosis: Secondary | ICD-10-CM

## 2013-08-06 DIAGNOSIS — M797 Fibromyalgia: Secondary | ICD-10-CM

## 2013-08-06 DIAGNOSIS — D0511 Intraductal carcinoma in situ of right breast: Secondary | ICD-10-CM

## 2013-08-06 DIAGNOSIS — F17200 Nicotine dependence, unspecified, uncomplicated: Secondary | ICD-10-CM

## 2013-08-06 DIAGNOSIS — Z720 Tobacco use: Secondary | ICD-10-CM

## 2013-08-06 LAB — COMPREHENSIVE METABOLIC PANEL (CC13)
ALBUMIN: 4.1 g/dL (ref 3.5–5.0)
ALK PHOS: 51 U/L (ref 40–150)
ALT: 25 U/L (ref 0–55)
AST: 25 U/L (ref 5–34)
Anion Gap: 12 mEq/L — ABNORMAL HIGH (ref 3–11)
BUN: 13.9 mg/dL (ref 7.0–26.0)
CHLORIDE: 103 meq/L (ref 98–109)
CO2: 24 mEq/L (ref 22–29)
CREATININE: 0.9 mg/dL (ref 0.6–1.1)
Calcium: 9.3 mg/dL (ref 8.4–10.4)
Glucose: 109 mg/dl (ref 70–140)
Potassium: 3.3 mEq/L — ABNORMAL LOW (ref 3.5–5.1)
Sodium: 139 mEq/L (ref 136–145)
Total Bilirubin: 0.46 mg/dL (ref 0.20–1.20)
Total Protein: 7.1 g/dL (ref 6.4–8.3)

## 2013-08-06 LAB — CBC WITH DIFFERENTIAL/PLATELET
BASO%: 0.4 % (ref 0.0–2.0)
BASOS ABS: 0 10*3/uL (ref 0.0–0.1)
EOS%: 0.7 % (ref 0.0–7.0)
Eosinophils Absolute: 0.1 10*3/uL (ref 0.0–0.5)
HEMATOCRIT: 41.6 % (ref 34.8–46.6)
HEMOGLOBIN: 14.4 g/dL (ref 11.6–15.9)
LYMPH%: 28.2 % (ref 14.0–49.7)
MCH: 32.2 pg (ref 25.1–34.0)
MCHC: 34.6 g/dL (ref 31.5–36.0)
MCV: 93.1 fL (ref 79.5–101.0)
MONO#: 0.4 10*3/uL (ref 0.1–0.9)
MONO%: 5.9 % (ref 0.0–14.0)
NEUT%: 64.8 % (ref 38.4–76.8)
NEUTROS ABS: 4.8 10*3/uL (ref 1.5–6.5)
Platelets: 188 10*3/uL (ref 145–400)
RBC: 4.47 10*6/uL (ref 3.70–5.45)
RDW: 13.2 % (ref 11.2–14.5)
WBC: 7.3 10*3/uL (ref 3.9–10.3)
lymph#: 2.1 10*3/uL (ref 0.9–3.3)

## 2013-08-06 MED ORDER — TAMOXIFEN CITRATE 20 MG PO TABS: 20.0000 mg | ORAL_TABLET | Freq: Every day | ORAL | Status: DC

## 2013-08-06 MED ORDER — EZETIMIBE 10 MG PO TABS: 10.0000 mg | ORAL_TABLET | Freq: Every day | ORAL | Status: DC

## 2013-08-06 MED ORDER — ATENOLOL 50 MG PO TABS: ORAL_TABLET | ORAL | Status: DC

## 2013-08-06 NOTE — Telephone Encounter (Signed)
Atenolol refilled.  Samples of zetia at front desk.  Left message on number provided for patient to return call.

## 2013-08-06 NOTE — Progress Notes (Signed)
Ozetta was present in the [facility] lobby during a code blue and was visibly shaken by the experience.  Chaplain Epifania Gore provided initial spiritual and emotional support, and then I assumed further care at the art table.  Having the opportunity to express her care for the affected patient and to debrief afterward with caring staff helped [facility] reduce distress and to re-ground herself in the present moment and tasks of her appointment today.   [place], [place]

## 2013-08-06 NOTE — Telephone Encounter (Signed)
See previous telephone encounter.

## 2013-08-06 NOTE — Telephone Encounter (Signed)
Returning your call. °

## 2013-08-06 NOTE — Progress Notes (Signed)
[place]  Telephone:([phone] Fax:([phone]) [phone]  OFFICE PROGRESS NOTE   ID: Anita Arroyo   DOB: 1960/01/27  MR#: [id number]  PQZ#:[id number]   PCP: Horton Finer, MD GYN:  SU: Dr Marlou Starks OTHER MD: Floria Raveling, Amada Jupiter, Marcellina Millin  REASON FOR OFFICE VISIT: Breast cancer follow up.   PATIENT IDENTIFICATION :  This is a pleasant 53 year old woman from [place] with hx of breast cancer.  This woman has been in reasonably good health.  She does have a history of ADHD and bipolar disease.  She has not had a mammogram in about two years.  She sought medical attention because of intense itchiness of her left nipple.  She subsequently had a screening mammogram on 04/22/2009, which showed calcifications of right breast visual views recommended.  A diagnostic mammogram and ultrasound was performed on 04/28/2009.  Physical exam did not reveal any abnormalities.  Her ultrasound did show multiple hypoechoic irregularities of the right breast.  Most of these were simple cysts.  A suspicious irregular hypoechoic lesion was seen measuring 2.3 x 0.7 x 1.1 cm.  Calcifications of the right breast with distortion were also seen.  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  A subsequent MRI scan of both breasts was performed on 05/13/2009. In the right breast at 12 o'clock position there was irregular area of enhancement measuring 1.7 x 1.6 x 1.3 cm.  No abnormal lymph nodes were seen and the other breast was normal.  The patient ultimately underwent a lumpectomy and sentinel lymph node evaluation on 06/02/2009.  Initially, the feeling was that this was an invasive and in situ ductal carcinoma with three benign sentinel lymph nodes, total area was 1.5 cm, margins were uninvolved.  Histological grade was 2.  The subsequent reevaluation of this material after special stains were performed suggested  that this was in fact all DCIS, margins were uninvolved.  ER/PR positive at 89% and 35% respectively.  Three sentinel lymph nodes were again negative for malignancy.    Patient thean received radiation therapy from 07/07/2009 through 08/19/2009.  She had Comprehensive BRACA analysis report dated 10/09/2010 showed the patient was negative for the BRCA1 and BRCA2 gene mutations. The patient started Tamoxifen in 09/2009. The patient's last bilateral digital diagnostic mammogram on 10/11/2012 showed stable benign findings BIRADS 2.   Status post bilateral breast MRI on 09/30/2011 showed No specific MRI evidence of malignancy in either breast.  Post-treatment changes of the right breast.  Significant decrease in background parenchymal enhancement pattern compared to prior study of MRI likely reflects changes in hormonal status or hormonal therapy. She comes for follow up.   INTERVAL HISTORY:  Overall patient is doing well. She is very interested in quitting smoking in the past she has tried Chantix. Indeed help her for few months but then she started smoking again patient is also worried about the cost of Chantix which would be $200 per month. She has used nicorette gums and electronic cigarettes in past. Patient also goes to [facility] and is being followed by Dr. Adele Schilder. She was satisfied with her care there. She continues to get some hot flashes and she will try vitamin E supplements for that. Another option is the use of Cymbalta and I will need to check with Dr.Arfeen that with the current use of lamictal, trazodone and Effexor, whether it would be safe to use Cymbalta? She was given some information  about smoking cessation programs today. She continues to smoke about 1-2 packs per day and we will get a chest x-ray next year  REVIEW OF SYSTEMS: The patient has chronic problems with bipolar disorder and ADHD. She is disabled partially because of that and partially because of problems from a  remote AA. She is tolerating the tamoxifen without significant hot flashes or other symptoms she is aware of. A detailed review of systems was otherwise noncontributory  PAST MEDICAL HISTORY: Past Medical History  Diagnosis Date  . Heart murmur   . ADHD (attention deficit hyperactivity disorder)   . Bipolar 1 disorder   . Arthritis   . Meniere disease   . Neuromuscular disorder     fibromyalgia  . Fibromyalgia   . GERD (gastroesophageal reflux disease)   . Dysrhythmia     palpitations  . Depression   . Bipolar 1 disorder   . MVP (mitral valve prolapse)   . Hyperlipemia   . Breast cancer   . Hx of echocardiogram 04/28/2011    showed normal systolic function with mild diastolic dysfunction,she had trace MR but did not have frank mitral valve prolapse demonstrated. She had mild pulmonary hypertension with an estimated RV systolic pressure at 34 mm.  . History of stress test 04/28/2011    showed normal perfusion without scar or ischemia  Significant for history of the left ankle surgery x4, history of arthritis, history of fiber polyp removed in February 2011 from the uterus, history of left wrist surgery, history of right elbow surgery.  History of ADHD diagnosed 10 years ago.  She has a history of Mnire's diagnosed a number of years ago.   PAST SURGICAL HISTORY: Past Surgical History  Procedure Laterality Date  . Tonsillectomy    . Deviated septum    . Ectopic pregnancy surgery    . Tmj arthroplasty    . Uterine fibroid surgery      polups and fibroids removed   . Elbow surgery      right elbow  . Wrist surgery      left  . Ankle surgery      left x4  . Knee surgery      left knee  . Fibroid      2-3 fibroid adenomas removed  . Joint replacement  2011    right knee   . Breast surgery  2011    lumpectomy right breast     FAMILY HISTORY Family History  Problem Relation Age of Onset  . Mitral valve prolapse Mother   . Heart Problems Mother   . Cancer Mother     The patient is adopted. However she has contacted her biological mother, "myrtle". Girdle was diagnosed with Breast cancer (? the patient is not sure) before the age of 71. Marital was only 14 when she had the patient. The patient has not contacted her biological father.  GYNECOLOGIC HISTORY: Gravida 0, para 0, menarche at 11 or 36, last menstrual period was March of 2013.  No history of hormone replacement therapy.  SOCIAL HISTORY: The patient is single.  She is on disability secondary to her bipolar disease.  She works at pets [employer], and does some gardening. She has 4 cats at home.   ADVANCED DIRECTIVES:  HEALTH MAINTENANCE: History  Substance Use Topics  . Smoking status: Current Every Day Smoker -- 1.50 packs/day for 30 years    Types: Cigarettes  . Smokeless tobacco: Never Used  . Alcohol Use: Yes  Comment: occ    Colonoscopy: PAP: Bone density: Lipid panel:  Allergies  Allergen Reactions  . Contrast Media [Iodinated Diagnostic Agents] Shortness Of Breath  . Adhesive [Tape]     blister  . Amoxicillin-Pot Clavulanate   . Iodine   . Moxifloxacin   . Sulfonamide Derivatives   . Latex     nausea    Current Outpatient Prescriptions  Medication Sig Dispense Refill  . ADVAIR DISKUS 250-50 MCG/DOSE AEPB       . atenolol (TENORMIN) 50 MG tablet Take 1 tablet ($RemoveB'50mg'dYNaioMa$ ) by mouth in the AM and 1/2 tablet ($RemoveBef'25mg'KZhWvWYvAQ$ ) in the PM  135 tablet  0  . Azelastine HCl (ASTEPRO) 0.15 % SOLN by Nasal route as directed.        . Azelastine-Fluticasone (DYMISTA NA) Place into the nose.      . cetirizine (ZYRTEC) 10 MG tablet Take by mouth as directed.        . Emollient (NEOSALUS CP) CREA Apply 1 application topically daily as needed.      . ezetimibe (ZETIA) 10 MG tablet Take 1 tablet (10 mg total) by mouth daily.  28 tablet  0  . HYDROcodone-acetaminophen (NORCO/VICODIN) 5-325 MG per tablet       . lamoTRIgine (LAMICTAL) 150 MG tablet Take 1 tablet (150 mg total) by mouth 2 (two) times  daily.  180 tablet  0  . pantoprazole (PROTONIX) 40 MG tablet Take 1 tablet by mouth daily.      . traZODone (DESYREL) 100 MG tablet Takes 2 tab at bed time  180 tablet  0  . triamterene-hydrochlorothiazide (DYAZIDE) 37.5-25 MG per capsule Take by mouth as directed.        . venlafaxine XR (EFFEXOR-XR) 150 MG 24 hr capsule Take 1 capsule (150 mg total) by mouth daily.  90 capsule  0  . tamoxifen (NOLVADEX) 20 MG tablet Take 1 tablet (20 mg total) by mouth daily.  90 tablet  3   No current facility-administered medications for this visit.    OBJECTIVE: Middle-aged white woman in no acute distress Filed Vitals:   08/06/13 1416  BP: 139/76  Pulse: 66  Temp: 98.4 F (36.9 C)  Resp: 18     Body mass index is 31.59 kg/(m^2).      ECOG FS: 1.vita Sclerae unicteric Oropharynx clear No cervical or supraclavicular adenopathy Lungs no rales or rhonchi Heart regular rate and rhythm Abd benign MSK no focal spinal tenderness, no peripheral edema Neuro: nonfocal Breasts: The right breast is status post lumpectomy and radiation. There is no evidence of local recurrence. The right axilla is benign. The left breast is unremarkable.   LAB RESULTS: Lab Results  Component Value Date   WBC 7.3 08/06/2013   NEUTROABS 4.8 08/06/2013   HGB 14.4 08/06/2013   HCT 41.6 08/06/2013   MCV 93.1 08/06/2013   PLT 188 08/06/2013      Chemistry      Component Value Date/Time   NA 139 08/06/2013 1350   NA 141 10/12/2011 1555   K 3.3* 08/06/2013 1350   K 3.7 10/12/2011 1555   CL 104 06/29/2012 1221   CL 102 10/12/2011 1555   CO2 24 08/06/2013 1350   CO2 29 10/12/2011 1555   BUN 13.9 08/06/2013 1350   BUN 14 10/12/2011 1555   CREATININE 0.9 08/06/2013 1350   CREATININE 0.87 10/12/2011 1555      Component Value Date/Time   CALCIUM 9.3 08/06/2013 1350   CALCIUM 9.6 10/12/2011 1555  ALKPHOS 51 08/06/2013 1350   ALKPHOS 65 06/30/2010 1338   AST 25 08/06/2013 1350   AST 25 06/30/2010 1338   ALT 25 08/06/2013 1350   ALT  17 06/30/2010 1338   BILITOT 0.46 08/06/2013 1350   BILITOT 0.3 06/30/2010 1338            STUDIES: Mammogram reviewed from October 23, 2012  BIRADS 2 MAMMOGRAM.   ASSESSMENT/PLAN:   53 y.o. woman: 1.  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  2.  Status post bilateral breast MRI on 05/13/2009 which showed an irregular mass-like area of enhancement which was more prominent than the surrounding background parenchymal enhancement pattern.  The irregular area measured 1.7 x 1.6 x 1.3 cm.  No definite multicentric or multifocal disease was identified in the right breast.  No dominant mass or asymmetric area of enhancement was identified in the left breast to suggest malignancy.  Negative for axillary or internal mammary chain lymphadenopathy.  3.  Status post right breast lumpectomy with right axillary lymph node sentinel biopsy on 06/02/2009 for DCIS (initially read as invasive, but later revised as showing only non-invasive disease)  4.  Status post radiation therapy from 07/07/2009 through 08/19/2009.  5.  Comprehensive BRACA analysis report dated 10/09/2010 showed the patient was negative for the BRCA1 and BRCA2 gene mutations.  6.  The patient started Tamoxifen in 09/2009 and she is tolerating it well.  7.  The patient's last bilateral digital diagnostic mammogram October 23, 2012 was negative.  8. I will check with Dr. Adele Schilder whether Cymbalta can be used with other psychiatric medications.  9. We are also trying to get her some financial help for smoking cessation needs  10. For her hot flashes, she will try daily Vitamin E supplements.  11. I will also order a Chest Xray because of her ongoing heavy smoking in 1 year.    Bernadene Bell, MD Medical Hematologist/Oncologist [place] Pager:  [phone] Office No: [phone]

## 2013-08-06 NOTE — Telephone Encounter (Signed)
Would like some samples of Zetia 10 mg please.Also need her Atenolol called in to [facility] please.

## 2013-08-06 NOTE — Telephone Encounter (Signed)
Spoke with patient. Was notified for refill and samples

## 2013-08-07 LAB — FOLLICLE STIMULATING HORMONE: FSH: 28.1 m[IU]/mL

## 2013-08-07 LAB — LUTEINIZING HORMONE: LH: 18.2 m[IU]/mL

## 2013-08-08 ENCOUNTER — Telehealth: Payer: Self-pay | Admitting: *Deleted

## 2013-08-08 NOTE — Telephone Encounter (Signed)
Received call from pt. She was seen by Dr. Lona Kettle on 08/06/13. Pt called b/c she forgot the supplement the Dr. told her to try for the hotflashes she's been having. I clarified with the pt that he wants her to try Vit E supplements daily for hotflashes. Message to be forwarded to Dr. Lona Kettle.

## 2013-08-10 ENCOUNTER — Telehealth: Payer: Self-pay | Admitting: Oncology

## 2013-08-10 NOTE — Telephone Encounter (Signed)
s.w. pt and advised on SEpt and July appt...pt ok adn aware

## 2013-08-11 LAB — ESTRADIOL, ULTRA SENS: ESTRADIOL, ULTRA SENSITIVE: 5 pg/mL

## 2013-08-16 ENCOUNTER — Other Ambulatory Visit: Payer: Medicare Other

## 2013-08-23 ENCOUNTER — Ambulatory Visit: Payer: Medicare Other | Admitting: Oncology

## 2013-09-20 ENCOUNTER — Telehealth: Payer: Self-pay | Admitting: Cardiovascular Disease

## 2013-09-20 MED ORDER — EZETIMIBE 10 MG PO TABS: 10.0000 mg | ORAL_TABLET | Freq: Every day | ORAL | Status: DC

## 2013-09-20 NOTE — Telephone Encounter (Signed)
Would like some samples of Zetia 10 mg please.

## 2013-09-20 NOTE — Telephone Encounter (Signed)
Spoke with patient and let her know samples would be up front for pick up.  Patient voiced understanding

## 2013-10-19 ENCOUNTER — Ambulatory Visit (HOSPITAL_COMMUNITY): Payer: Self-pay | Admitting: Psychiatry

## 2013-10-22 ENCOUNTER — Ambulatory Visit
Admission: RE | Admit: 2013-10-22 | Discharge: 2013-10-22 | Disposition: A | Discharge status: Home / Self Care | Payer: Commercial Managed Care - HMO | Source: Ambulatory Visit | Attending: Hematology | Admitting: Hematology

## 2013-10-22 ENCOUNTER — Encounter (INDEPENDENT_AMBULATORY_CARE_PROVIDER_SITE_OTHER): Payer: Self-pay

## 2013-10-22 DIAGNOSIS — D0511 Intraductal carcinoma in situ of right breast: Secondary | ICD-10-CM

## 2013-10-25 ENCOUNTER — Telehealth: Payer: Self-pay | Admitting: Cardiovascular Disease

## 2013-10-25 MED ORDER — EZETIMIBE 10 MG PO TABS: 10.0000 mg | ORAL_TABLET | Freq: Every day | ORAL | Status: DC

## 2013-10-25 NOTE — Telephone Encounter (Signed)
Pt would like some samples of Zetia 10 mg please. °

## 2013-10-25 NOTE — Telephone Encounter (Signed)
Spoke with pt, aware samples at the front desk for pick up 

## 2013-11-02 ENCOUNTER — Encounter (HOSPITAL_COMMUNITY): Payer: Self-pay | Admitting: Psychiatry

## 2013-11-02 ENCOUNTER — Ambulatory Visit (INDEPENDENT_AMBULATORY_CARE_PROVIDER_SITE_OTHER): Payer: Commercial Managed Care - HMO | Admitting: Psychiatry

## 2013-11-02 VITALS — BP 128/60 | HR 65 | Ht 63.0 in | Wt 161.8 lb

## 2013-11-02 DIAGNOSIS — F319 Bipolar disorder, unspecified: Secondary | ICD-10-CM

## 2013-11-02 MED ORDER — LAMOTRIGINE 150 MG PO TABS: 150.0000 mg | ORAL_TABLET | Freq: Two times a day (BID) | ORAL | Status: DC

## 2013-11-02 MED ORDER — TRAZODONE HCL 100 MG PO TABS: ORAL_TABLET | ORAL | Status: DC

## 2013-11-02 MED ORDER — VENLAFAXINE HCL ER 150 MG PO CP24: 150.0000 mg | ORAL_CAPSULE | Freq: Every day | ORAL | Status: DC
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
## 2013-11-22 ENCOUNTER — Telehealth: Payer: Self-pay | Admitting: Cardiovascular Disease

## 2013-11-22 DIAGNOSIS — E785 Hyperlipidemia, unspecified: Secondary | ICD-10-CM

## 2013-11-22 DIAGNOSIS — I10 Essential (primary) hypertension: Secondary | ICD-10-CM

## 2013-11-22 NOTE — Telephone Encounter (Signed)
Returned a call to patient informing her that unless she has had recent labs done by another provider, then Dr. Claiborne Billings will most likely order fasting blood work. Patient will go to lab tomorrow to get fasting labs done. Lab orders per Dr. Evette Georges protocol was ordered.

## 2013-11-22 NOTE — Telephone Encounter (Signed)
Pt has an appointment with Dr Claiborne Billings on Monday. She wants to know if she needs to fast for blood work?

## 2013-11-23 LAB — COMPREHENSIVE METABOLIC PANEL
ALT: 16 U/L (ref 0–35)
AST: 23 U/L (ref 0–37)
Albumin: 4.2 g/dL (ref 3.5–5.2)
Alkaline Phosphatase: 46 U/L (ref 39–117)
BILIRUBIN TOTAL: 0.5 mg/dL (ref 0.2–1.2)
BUN: 15 mg/dL (ref 6–23)
CHLORIDE: 104 meq/L (ref 96–112)
CO2: 25 meq/L (ref 19–32)
CREATININE: 0.9 mg/dL (ref 0.50–1.10)
Calcium: 9.1 mg/dL (ref 8.4–10.5)
GLUCOSE: 88 mg/dL (ref 70–99)
Potassium: 4.4 mEq/L (ref 3.5–5.3)
Sodium: 137 mEq/L (ref 135–145)
Total Protein: 6.3 g/dL (ref 6.0–8.3)

## 2013-11-23 LAB — LIPID PANEL
Cholesterol: 144 mg/dL (ref 0–200)
HDL: 49 mg/dL (ref >39)
LDL Cholesterol: 79 mg/dL (ref 0–99)
Total CHOL/HDL Ratio: 2.9 Ratio
Triglycerides: 80 mg/dL (ref <150)
VLDL: 16 mg/dL (ref 0–40)

## 2013-11-23 LAB — CBC
HCT: 40.8 % (ref 36.0–46.0)
HEMOGLOBIN: 14.3 g/dL (ref 12.0–15.0)
MCH: 32.1 pg (ref 26.0–34.0)
MCHC: 35 g/dL (ref 30.0–36.0)
MCV: 91.7 fL (ref 78.0–100.0)
Platelets: 190 10*3/uL (ref 150–400)
RBC: 4.45 MIL/uL (ref 3.87–5.11)
RDW: 13.1 % (ref 11.5–15.5)
WBC: 6.5 10*3/uL (ref 4.0–10.5)

## 2013-11-23 LAB — TSH: TSH: 2.129 u[IU]/mL (ref 0.350–4.500)

## 2013-11-26 ENCOUNTER — Encounter: Payer: Self-pay | Admitting: Cardiovascular Disease

## 2013-11-26 ENCOUNTER — Ambulatory Visit (INDEPENDENT_AMBULATORY_CARE_PROVIDER_SITE_OTHER): Payer: Commercial Managed Care - HMO | Admitting: Cardiovascular Disease

## 2013-11-26 VITALS — BP 112/62 | HR 67 | Ht 62.5 in | Wt 159.0 lb

## 2013-11-26 DIAGNOSIS — Z87891 Personal history of nicotine dependence: Secondary | ICD-10-CM

## 2013-11-26 DIAGNOSIS — K219 Gastro-esophageal reflux disease without esophagitis: Secondary | ICD-10-CM

## 2013-11-26 DIAGNOSIS — M797 Fibromyalgia: Secondary | ICD-10-CM

## 2013-11-26 DIAGNOSIS — I10 Essential (primary) hypertension: Secondary | ICD-10-CM

## 2013-11-26 DIAGNOSIS — E785 Hyperlipidemia, unspecified: Secondary | ICD-10-CM

## 2013-11-26 MED ORDER — ATENOLOL 50 MG PO TABS: 50.0000 mg | ORAL_TABLET | Freq: Two times a day (BID) | ORAL | Status: DC

## 2013-11-26 NOTE — Progress Notes (Signed)
Patient ID: Anita Arroyo, female   DOB: [birth year], 53 y.o.   MRN: [id number]     HPI: Anita Arroyo is a 53 y.o. female who presents for one-year cardiology evaluation.  Anita Arroyo is a 53 year old female who I initially saw in March 2013 for evaluation of episodes of chest tightness with walking. She has a long-standing history of ongoing tobacco use, and reportedly it had a history of possible mitral valve prolapse. An echo Doppler study in April 2013 showed normal systolic function with grade 1 diastolic dysfunction. She had trace mitral regurgitation and frank mitral valve prolapse was not demonstrated. She did have mild pulmonary hypertension with estimated systolic pressure 34 mm. A nuclear perfusion study revealed normal perfusion.  She as ahistory of hyperlipidemia which is treated with Zetia. She has undergone left shoulder surgery by Dr. Percell Miller and had a ruptured by sepsis tendon. 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 Percell Miller, and history of bipolar polar disorder followed by Dr. Lovena Le as well as fibromyalgia.  Over the past year, she states that she has lost approximately 21 pounds.  She has seen a nutritionist.  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 Unfortunately, she is still smoking.  She never had the prescription for Chantix filled.  She does have Mnire's disease for which she takes Dyazide.  She is on trazodone 200 mg at bedtime and also takes Lamictal 150 mg twice a day in addition to Effexor XR for her bipolar history.  She continues to take tamoxifen with her history of breast CA.  She does have a history of palpitations and has been taking atenolol 50 mg in the morning and 25 mg at night.  Recently, she has noticed more frequent palpitations that are  short-lived, but can occur proximally, 4 days per week.    She recently underwent a complete set of blood work on 11/22/2013.  Comprehensive metabolic panel, CBC, thyroid function studies were all entirely normal.  Lipid studies were excellent with a total cholesterol 144, triglycerides 80, HDL 49, LDL 79.  She presents now for evaluation.    Past Medical History  Diagnosis Date  . Heart murmur   . ADHD (attention deficit hyperactivity disorder)   . Bipolar 1 disorder   . Arthritis   . Meniere disease   . Neuromuscular disorder     fibromyalgia  . Fibromyalgia   . GERD (gastroesophageal reflux disease)   . Dysrhythmia     palpitations  . Depression   . Bipolar 1 disorder   . MVP (mitral valve prolapse)   . Hyperlipemia   . Breast cancer   . Hx of echocardiogram 04/28/2011    showed normal systolic function with mild diastolic dysfunction,she had trace MR but did not have frank mitral valve prolapse demonstrated. She had mild pulmonary hypertension with an estimated RV systolic pressure at 34 mm.  . History of stress test 04/28/2011    showed normal perfusion without scar or ischemia    Past Surgical History  Procedure Laterality Date  . Tonsillectomy    . Deviated septum    . Ectopic pregnancy surgery    . Tmj arthroplasty    . Uterine fibroid surgery      polups and fibroids removed   . Elbow  surgery      right elbow  . Wrist surgery      left  . Ankle surgery      left x4  . Knee surgery      left knee  . Fibroid      2-3 fibroid adenomas removed  . Joint replacement  2011    right knee   . Breast surgery  2011    lumpectomy right breast     Allergies  Allergen Reactions  . Contrast Media [Iodinated Diagnostic Agents] Shortness Of Breath  . Adhesive [Tape]     blister  . Amoxicillin-Pot Clavulanate   . Iodine   . Moxifloxacin   . Sulfonamide Derivatives   . Latex     nausea    Current Outpatient Prescriptions  Medication Sig Dispense Refill  .  ADVAIR DISKUS 250-50 MCG/DOSE AEPB     . atenolol (TENORMIN) 50 MG tablet Take 1 tablet (50mg ) by mouth in the AM and 1/2 tablet (25mg ) in the PM 135 tablet 0  . Azelastine HCl (ASTEPRO) 0.15 % SOLN by Nasal route as directed.      . Azelastine-Fluticasone (DYMISTA NA) Place into the nose.    . cetirizine (ZYRTEC) 10 MG tablet Take by mouth as directed.      . Emollient (NEOSALUS CP) CREA Apply 1 application topically daily as needed.    . ezetimibe (ZETIA) 10 MG tablet Take 1 tablet (10 mg total) by mouth daily. 28 tablet 0  . HYDROcodone-acetaminophen (NORCO/VICODIN) 5-325 MG per tablet     . lamoTRIgine (LAMICTAL) 150 MG tablet Take 1 tablet (150 mg total) by mouth 2 (two) times daily. 180 tablet 0  . pantoprazole (PROTONIX) 40 MG tablet Take 1 tablet by mouth daily.    . tamoxifen (NOLVADEX) 20 MG tablet Take 1 tablet (20 mg total) by mouth daily. 90 tablet 3  . traZODone (DESYREL) 100 MG tablet Takes 2 tab at bed time 180 tablet 0  . triamterene-hydrochlorothiazide (DYAZIDE) 37.5-25 MG per capsule Take by mouth as directed.      . venlafaxine XR (EFFEXOR-XR) 150 MG 24 hr capsule Take 1 capsule (150 mg total) by mouth daily. 90 capsule 0   No current facility-administered medications for this visit.    History   Social History  . Marital Status: Single    Spouse Name: N/A    Number of Children: N/A  . Years of Education: N/A   Occupational History  . Not on file.   Social History Main Topics  . Smoking status: Current Every Day Smoker -- 1.50 packs/day for 30 years    Types: Cigarettes  . Smokeless tobacco: Never Used  . Alcohol Use: Yes     Comment: occ  . Drug Use: No  . Sexual Activity: Not on file   Other Topics Concern  . Not on file   Social History Narrative    Family History  Problem Relation Age of Onset  . Mitral valve prolapse Mother   . Heart Problems Mother   . Cancer Mother    Socially she is single. She has no children. She has been smoking for  approximately 37 years and still smokes one pack per day per. 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   ROS General: Negative; No fevers, chills, or night sweats;  HEENT: Negative; No changes in vision or hearing, sinus congestion, difficulty swallowing Pulmonary: Negative; No cough, wheezing, shortness of breath, hemoptysis Cardiovascular: Negative; No chest pain,  presyncope, syncope, palpitations GI: Negative; No nausea, vomiting, diarrhea, or abdominal pain GU: Negative; No dysuria, hematuria, or difficulty voiding Musculoskeletal: Negative; no myalgias, joint pain, or weakness Hematologic/Oncology: Negative; no easy bruising, bleeding Endocrine: Negative; no heat/cold intolerance; no diabetes Neuro: Negative; no changes in balance, headaches Skin: Negative; No rashes or skin lesions Psychiatric: Negative; No behavioral problems, depression Sleep: Negative; No snoring, daytime sleepiness, hypersomnolence, bruxism, restless legs, hypnogognic hallucinations, no cataplexy Other comprehensive 14 point system review is negative.   PE BP 112/62 mmHg  Pulse 67  Ht 5' 2.5" (1.588 m)  Wt 159 lb (72.122 kg)  BMI 28.60 kg/m2  General: Alert, oriented, no distress.  Skin: normal turgor, no rashes HEENT: Normocephalic, atraumatic. Pupils round and reactive; sclera anicteric;no lid lag.  Positive for Mnire's disease treated with Dyazide Nose without nasal septal hypertrophy Mouth/Parynx benign; Mallinpatti scale 3 Neck: No JVD, no carotid bruits Lungs: clear to ausculatation and percussion; no wheezing or rales Chest wall: resolution of prior costochondral discomfort Heart: RRR, s1 s2 normal 1/6 systolic murmur; no audible click..  No diastolic murmur.  No rubs thrills or heaves Abdomen: soft, nontender; no hepatosplenomehaly, BS+; abdominal aorta nontender and not dilated by palpation. Back: No CVA tenderness Pulses 2+ Extremities: no clubbing cyanosis or edema, Homan's  sign negative  Neurologic: grossly nonfocal Psychologic: normal affect and mood.  ECG (independently read by me): Normal sinus rhythm at 67 bpm.  QTc interval 443 ms.  Nonspecific T-wave changes.  October 2014 ECG: Sinus rhythm at 69 beats per minute. Mild RV conduction delay. Normal intervals.  LABS:  BMET    Component Value Date/Time   NA 137 11/22/2013 0904   NA 139 08/06/2013 1350   K 4.4 11/22/2013 0904   K 3.3* 08/06/2013 1350   CL 104 11/22/2013 0904   CL 104 06/29/2012 1221   CO2 25 11/22/2013 0904   CO2 24 08/06/2013 1350   GLUCOSE 88 11/22/2013 0904   GLUCOSE 109 08/06/2013 1350   GLUCOSE 107* 06/29/2012 1221   BUN 15 11/22/2013 0904   BUN 13.9 08/06/2013 1350   CREATININE 0.90 11/22/2013 0904   CREATININE 0.9 08/06/2013 1350   CREATININE 0.87 10/12/2011 1555   CALCIUM 9.1 11/22/2013 0904   CALCIUM 9.3 08/06/2013 1350   GFRNONAA 75* 10/12/2011 1555   GFRAA 87* 10/12/2011 1555     Hepatic Function Panel     Component Value Date/Time   PROT 6.3 11/22/2013 0904   PROT 7.1 08/06/2013 1350   ALBUMIN 4.2 11/22/2013 0904   ALBUMIN 4.1 08/06/2013 1350   AST 23 11/22/2013 0904   AST 25 08/06/2013 1350   ALT 16 11/22/2013 0904   ALT 25 08/06/2013 1350   ALKPHOS 46 11/22/2013 0904   ALKPHOS 51 08/06/2013 1350   BILITOT 0.5 11/22/2013 0904   BILITOT 0.46 08/06/2013 1350     CBC    Component Value Date/Time   WBC 6.5 11/22/2013 0904   WBC 7.3 08/06/2013 1350   RBC 4.45 11/22/2013 0904   RBC 4.47 08/06/2013 1350   HGB 14.3 11/22/2013 0904   HGB 14.4 08/06/2013 1350   HCT 40.8 11/22/2013 0904   HCT 41.6 08/06/2013 1350   PLT 190 11/22/2013 0904   PLT 188 08/06/2013 1350   MCV 91.7 11/22/2013 0904   MCV 93.1 08/06/2013 1350   MCH 32.1 11/22/2013 0904   MCH 32.2 08/06/2013 1350   MCHC 35.0 11/22/2013 0904   MCHC 34.6 08/06/2013 1350   RDW 13.1 11/22/2013 0904  RDW 13.2 08/06/2013 1350   LYMPHSABS 2.1 08/06/2013 1350   LYMPHSABS 1.6 06/02/2009 0928    MONOABS 0.4 08/06/2013 1350   MONOABS 0.4 06/02/2009 0928   EOSABS 0.1 08/06/2013 1350   EOSABS 0.1 06/02/2009 0928   BASOSABS 0.0 08/06/2013 1350   BASOSABS 0.0 06/02/2009 0928     BNP No results found for: PROBNP  Lipid Panel     Component Value Date/Time   CHOL 144 11/22/2013 0904   Lipid Panel     Component Value Date/Time   CHOL 144 11/22/2013 0904   TRIG 80 11/22/2013 0904   HDL 49 11/22/2013 0904   CHOLHDL 2.9 11/22/2013 0904   VLDL 16 11/22/2013 0904   LDLCALC 79 11/22/2013 0904     RADIOLOGY: Dg Chest 2 View  11/10/2012   CLINICAL DATA:  Cough and chest pain  EXAM: CHEST  2 VIEW  COMPARISON:  July 30, 2012  FINDINGS: There is a calcified granuloma in the left lower lobe. Lungs are otherwise clear. Heart size and pulmonary vascularity are normal. No adenopathy. There are no appreciable bone lesions.  IMPRESSION: Granuloma left base. No edema or consolidation   Electronically Signed   By: Lowella Grip M.D.   On: 11/10/2012 15:22   Mr Shoulder Left Wo Contrast  11/12/2012   CLINICAL DATA:  Extreme shoulder pain for months. Prior shoulder surgery approximately 1 year ago.  EXAM: MRI OF THE LEFT SHOULDER WITHOUT CONTRAST  TECHNIQUE: Multiplanar, multisequence MR imaging of the shoulder was performed. No intravenous contrast was administered.  COMPARISON:  Left shoulder MRI 06/04/2011.  FINDINGS: Rotator cuff: Interval development of infraspinous tendinosis with hyperintensity extending to the musculotendinous junction. No focal tear is demonstrated. There is stable mild supraspinatus and subscapularis tendinosis.  Muscles:  No focal muscular atrophy or edema.  Biceps long head: The intra-articular portion of the biceps tendon is now diminutive with irregular signal consistent with tendinosis and partial tearing. No full-thickness tendon tear or tendon dislocation is identified.  Acromioclavicular Joint: The acromion is type 1. Patient has undergone interval distal  clavicle resection and probable acromioplasty. There is no osseous encroachment on the rotator cuff passageway. There is no significant fluid in the subacromial -subdeltoid space.  Glenohumeral Joint: No significant shoulder joint effusion. The previously demonstrated edema and cystic changes superiorly in the glenoid have improved. However, there is new edema and subchondral cyst formation in the anterior inferior glenoid.  Labrum: There is labral degeneration without evidence of discrete tear.  Bones: No significant extra-articular osseous findings. The cystic changes previously noted medial to the lesser tuberosity have improved.  IMPRESSION: 1. Interval distal clavicle resection and acromioplasty. No rotator cuff impingement identified. 2. Infraspinous tendinosis without evidence of rotator cuff tear. 3. New diminution of the long head of the biceps tendon consistent with partial tearing. 4. Progressive glenohumeral degenerative changes with new subchondral cyst formation and edema in the anterior inferior glenoid. No discrete labral tear identified.   Electronically Signed   By: Camie Patience M.D.   On: 11/12/2012 12:14      ASSESSMENT AND PLAN: Ms Forrey is a 47 -year-old female with a long history of tobacco use. When I saw her last year, I recommended she undergo a chest x-ray which did not reveal any active disease and she did have present the previous granuloma in the left base of her lung.  Her blood pressure today is excellent.  Her laboratory is excellent.  She is not having any wheezing on Advair  Diskus.  She is tolerating Zetia with excellent lipid studies.  There is no edema.  She takes pantoprazole for GERD.  Her ECG remained stable.  Again discussed smoking cessation with additional weight reduction.  I commended her on her weight loss.  I will see her in one year for cardiology reevaluation or sooner if problem arise.   Troy Sine, MD, [facility]  11/26/2013 11:39 AM

## 2013-11-26 NOTE — Patient Instructions (Signed)
Your physician has recommended you make the following change in your medication: increase the atenolol to 50 mg twice a day.  Your physician wants you to follow-up in: 1 year or sooner if needed with Dr. Claiborne Billings. You will receive a reminder letter in the mail two months in advance. If you don't receive a letter, please call our office to schedule the follow-up appointment.

## 2013-11-28 ENCOUNTER — Other Ambulatory Visit: Payer: Self-pay | Admitting: Gastroenterology

## 2013-12-13 ENCOUNTER — Other Ambulatory Visit: Payer: Self-pay | Admitting: Cardiovascular Disease

## 2013-12-13 MED ORDER — EZETIMIBE 10 MG PO TABS: 10.0000 mg | ORAL_TABLET | Freq: Every day | ORAL | Status: DC

## 2013-12-13 NOTE — Telephone Encounter (Signed)
Pt would like some samples of Zetia please. °

## 2013-12-13 NOTE — Telephone Encounter (Signed)
Medication amples have been provided to the patient.  Drug name: Zetia 10  Qty: 35 tabs LOT: [id number]  Exp.Date: 03/2016  Samples left at front desk for patient pick-up. Attempted to contact patient regarding samples. Patient has generic voicemail, will attempt to reach patient tomorrow.  Diana Eves 5:30 PM 12/13/2013

## 2013-12-18 ENCOUNTER — Telehealth: Payer: Self-pay | Admitting: Cardiovascular Disease

## 2013-12-18 MED ORDER — EZETIMIBE 10 MG PO TABS: 10.0000 mg | ORAL_TABLET | Freq: Every day | ORAL | Status: DC

## 2013-12-18 NOTE — Telephone Encounter (Signed)
Pt called back in wanting to know if any samples for Zetia are available. Please call  Thanks

## 2013-12-18 NOTE — Telephone Encounter (Signed)
Informed patient  Samples of zetia -2 weeks Patient states she will pick up

## 2014-01-12 ENCOUNTER — Other Ambulatory Visit: Payer: Self-pay | Admitting: Oncology

## 2014-01-14 ENCOUNTER — Other Ambulatory Visit: Payer: Self-pay | Admitting: *Deleted

## 2014-01-17 ENCOUNTER — Telehealth (HOSPITAL_COMMUNITY): Payer: Self-pay | Admitting: *Deleted

## 2014-01-17 NOTE — Telephone Encounter (Signed)
Pt called for refill of Lamotragine. Chart reviewed. Called to verify with patient that she did not have enough until appointment 02/04/14. Pt states she had a refill on file at her pharmacy and was picking it up. No longer needed.

## 2014-01-30 DIAGNOSIS — H25812 Combined forms of age-related cataract, left eye: Secondary | ICD-10-CM | POA: Diagnosis not present

## 2014-01-30 DIAGNOSIS — H2512 Age-related nuclear cataract, left eye: Secondary | ICD-10-CM | POA: Diagnosis not present

## 2014-02-04 ENCOUNTER — Ambulatory Visit (INDEPENDENT_AMBULATORY_CARE_PROVIDER_SITE_OTHER): Payer: 59 | Admitting: Psychiatry

## 2014-02-04 ENCOUNTER — Encounter (HOSPITAL_COMMUNITY): Payer: Self-pay | Admitting: Psychiatry

## 2014-02-04 VITALS — BP 113/63 | HR 70 | Ht 62.5 in | Wt 157.8 lb

## 2014-02-04 DIAGNOSIS — F319 Bipolar disorder, unspecified: Secondary | ICD-10-CM | POA: Diagnosis not present

## 2014-02-04 DIAGNOSIS — F909 Attention-deficit hyperactivity disorder, unspecified type: Secondary | ICD-10-CM | POA: Diagnosis not present

## 2014-02-04 MED ORDER — VENLAFAXINE HCL ER 150 MG PO CP24: 150.0000 mg | ORAL_CAPSULE | Freq: Every day | ORAL | Status: DC

## 2014-02-04 MED ORDER — TRAZODONE HCL 100 MG PO TABS: ORAL_TABLET | ORAL | Status: DC

## 2014-02-04 MED ORDER — LAMOTRIGINE 150 MG PO TABS: 150.0000 mg | ORAL_TABLET | Freq: Two times a day (BID) | ORAL | Status: DC
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
## 2014-02-06 DIAGNOSIS — F17210 Nicotine dependence, cigarettes, uncomplicated: Secondary | ICD-10-CM | POA: Diagnosis not present

## 2014-02-06 DIAGNOSIS — H2511 Age-related nuclear cataract, right eye: Secondary | ICD-10-CM | POA: Diagnosis not present

## 2014-02-06 DIAGNOSIS — Z961 Presence of intraocular lens: Secondary | ICD-10-CM | POA: Diagnosis not present

## 2014-02-12 ENCOUNTER — Encounter (HOSPITAL_COMMUNITY): Payer: Self-pay | Admitting: *Deleted

## 2014-02-13 DIAGNOSIS — H2511 Age-related nuclear cataract, right eye: Secondary | ICD-10-CM | POA: Diagnosis not present

## 2014-02-13 DIAGNOSIS — H25811 Combined forms of age-related cataract, right eye: Secondary | ICD-10-CM | POA: Diagnosis not present

## 2014-02-15 NOTE — Progress Notes (Signed)
Consulted Dr. Delma Post about patient having mild pulmonary hypertension in an office note from Cardiology. Patient is OK for procedure.

## 2014-02-21 ENCOUNTER — Other Ambulatory Visit: Payer: Self-pay | Admitting: Gastroenterology

## 2014-02-25 ENCOUNTER — Ambulatory Visit (HOSPITAL_COMMUNITY)
Admission: RE | Admit: 2014-02-25 | Discharge: 2014-02-25 | Disposition: A | Discharge status: Home / Self Care | Payer: Commercial Managed Care - HMO | Source: Ambulatory Visit | Attending: Gastroenterology | Admitting: Gastroenterology

## 2014-02-25 ENCOUNTER — Ambulatory Visit (HOSPITAL_COMMUNITY): Payer: Commercial Managed Care - HMO | Admitting: Anesthesiology

## 2014-02-25 ENCOUNTER — Encounter (HOSPITAL_COMMUNITY): Payer: Self-pay | Admitting: Gastroenterology

## 2014-02-25 ENCOUNTER — Encounter (HOSPITAL_COMMUNITY)
Admission: RE | Disposition: A | Discharge status: Home / Self Care | Payer: Self-pay | Source: Ambulatory Visit | Attending: Gastroenterology

## 2014-02-25 DIAGNOSIS — Z09 Encounter for follow-up examination after completed treatment for conditions other than malignant neoplasm: Secondary | ICD-10-CM | POA: Insufficient documentation

## 2014-02-25 DIAGNOSIS — K219 Gastro-esophageal reflux disease without esophagitis: Secondary | ICD-10-CM | POA: Insufficient documentation

## 2014-02-25 DIAGNOSIS — Z853 Personal history of malignant neoplasm of breast: Secondary | ICD-10-CM | POA: Diagnosis not present

## 2014-02-25 DIAGNOSIS — F17200 Nicotine dependence, unspecified, uncomplicated: Secondary | ICD-10-CM | POA: Diagnosis not present

## 2014-02-25 DIAGNOSIS — Z86010 Personal history of colonic polyps: Secondary | ICD-10-CM | POA: Diagnosis not present

## 2014-02-25 DIAGNOSIS — Z1211 Encounter for screening for malignant neoplasm of colon: Secondary | ICD-10-CM | POA: Diagnosis not present

## 2014-02-25 DIAGNOSIS — J45909 Unspecified asthma, uncomplicated: Secondary | ICD-10-CM | POA: Insufficient documentation

## 2014-02-25 HISTORY — PX: COLONOSCOPY WITH PROPOFOL: SHX5780

## 2014-02-25 SURGERY — COLONOSCOPY WITH PROPOFOL
Anesthesia: Monitor Anesthesia Care

## 2014-02-25 MED ORDER — LACTATED RINGERS IV SOLN
INTRAVENOUS | Status: DC | PRN
  Administered 2014-02-25: 07:00:00 via INTRAVENOUS

## 2014-02-25 MED ORDER — SODIUM CHLORIDE 0.9 % IV SOLN: INTRAVENOUS | Status: DC

## 2014-02-25 MED ORDER — PROPOFOL 10 MG/ML IV BOLUS
INTRAVENOUS | Status: DC | PRN
  Administered 2014-02-25 (×3): 50 mg via INTRAVENOUS
  Administered 2014-02-25: 25 mg via INTRAVENOUS
  Administered 2014-02-25 (×2): 50 mg via INTRAVENOUS
  Administered 2014-02-25: 25 mg via INTRAVENOUS

## 2014-02-25 MED ORDER — PROPOFOL 10 MG/ML IV BOLUS
INTRAVENOUS | Status: AC
  Filled 2014-02-25: qty 20

## 2014-02-25 SURGICAL SUPPLY — 22 items

## 2014-02-25 NOTE — Anesthesia Preprocedure Evaluation (Signed)
Anesthesia Evaluation  Patient identified by MRN, date of birth, ID band Patient awake    Reviewed: Allergy & Precautions, NPO status , Patient's Chart, lab work & pertinent test results  Airway Mallampati: II  TM Distance: >3 FB Neck ROM: Full    Dental no notable dental hx. (+) Caps   Pulmonary neg pulmonary ROS, Current Smoker,  breath sounds clear to auscultation  Pulmonary exam normal       Cardiovascular negative cardio ROS  Rhythm:Regular Rate:Normal     Neuro/Psych negative neurological ROS  negative psych ROS   GI/Hepatic Neg liver ROS, GERD-  Medicated and Controlled,  Endo/Other  negative endocrine ROS  Renal/GU negative Renal ROS  negative genitourinary   Musculoskeletal negative musculoskeletal ROS (+)   Abdominal   Peds negative pediatric ROS (+)  Hematology negative hematology ROS (+)   Anesthesia Other Findings   Reproductive/Obstetrics negative OB ROS                             Anesthesia Physical Anesthesia Plan  ASA: II  Anesthesia Plan: MAC   Post-op Pain Management:    Induction:   Airway Management Planned: Simple Face Mask  Additional Equipment:   Intra-op Plan:   Post-operative Plan:   Informed Consent: I have reviewed the patients History and Physical, chart, labs and discussed the procedure including the risks, benefits and alternatives for the proposed anesthesia with the patient or authorized representative who has indicated his/her understanding and acceptance.   Dental advisory given  Plan Discussed with: CRNA  Anesthesia Plan Comments:         Anesthesia Quick Evaluation

## 2014-02-25 NOTE — Anesthesia Postprocedure Evaluation (Signed)
  Anesthesia Post-op Note  Patient: Anita Arroyo  Procedure(s) Performed: Procedure(s) (LRB): COLONOSCOPY WITH PROPOFOL (N/A)  Patient Location: PACU  Anesthesia Type: MAC  Level of Consciousness: awake and alert   Airway and Oxygen Therapy: Patient Spontanous Breathing  Post-op Pain: mild  Post-op Assessment: Post-op Vital signs reviewed, Patient's Cardiovascular Status Stable, Respiratory Function Stable, Patent Airway and No signs of Nausea or vomiting  Last Vitals:  Filed Vitals:   02/25/14 0829  BP:   Pulse: 63  Temp: 36.9 C  Resp: 16    Post-op Vital Signs: stable   Complications: No apparent anesthesia complications

## 2014-02-25 NOTE — H&P (Signed)
  Procedure: Surveillance colonoscopy. Colonoscopy with removal of a tubulovillous rectal adenoma performed on 08/04/2010  History: The patient is a 54 year old female born 1960/11/11. She is scheduled to undergo surveillance colonoscopy with polypectomy to prevent colon cancer.  Past medical history: Breast cancer. Rectal tubulovillous adenoma removed colonoscopically in 2012. Asthma. She will pregnancy. Right breast lumpectomy. Left shoulder surgery. Multiple knee surgeries.  Medication allergies: Sulfa drugs. Augmentin. Avelox. Intravenous contrast.  Exam: The patient is alert and lying comfortably on the endoscopy stretcher. Abdomen is soft and nontender to palpation. Lungs are clear to auscultation. Cardiac exam reveals a regular rhythm.  Plan: Proceed with surveillance colonoscopy

## 2014-02-25 NOTE — Op Note (Signed)
Procedure: Surveillance colonoscopy. Colonoscopy with removal of a tubulovillous rectal adenoma performed on 08/04/2010  Endoscopist: Earle Gell  Premedication: Propofol administered by anesthesia  Procedure: The patient was placed in the left lateral decubitus position. Anal inspection and digital rectal exam were normal. The Pentax pediatric colonoscope was introduced into the rectum and advanced to the cecum. A normal-appearing appendiceal orifice was identified. A normal-appearing ileocecal valve was identified. Colonic preparation for the exam today was good. Withdrawal time was 9 minutes  Rectum. Normal. Retroflexed view of the distal rectum normal  Sigmoid colon and descending colon. Normal  Splenic flexure. Normal  Transverse colon. Normal  Hepatic flexure. Normal  Ascending colon. Normal  Cecum and ileocecal valve. Normal  Assessment: Normal surveillance colonoscopy  Recommendation: Schedule repeat surveillance colonoscopy in 5 years

## 2014-02-25 NOTE — Transfer of Care (Signed)
Immediate Anesthesia Transfer of Care Note  Patient: Anita Arroyo  Procedure(s) Performed: Procedure(s): COLONOSCOPY WITH PROPOFOL (N/A)  Patient Location: PACU  Anesthesia Type:MAC  Level of Consciousness: awake, sedated and patient cooperative  Airway & Oxygen Therapy: Patient Spontanous Breathing and Patient connected to face mask oxygen  Post-op Assessment: Report given to RN and Post -op Vital signs reviewed and stable  Post vital signs: Reviewed and stable  Last Vitals:  Filed Vitals:   02/25/14 0712  BP: 109/47  Pulse: 69  Temp:   Resp: 20    Complications: No apparent anesthesia complications

## 2014-02-25 NOTE — Discharge Instructions (Signed)

## 2014-02-26 ENCOUNTER — Encounter (HOSPITAL_COMMUNITY): Payer: Self-pay | Admitting: Gastroenterology

## 2014-03-15 DIAGNOSIS — J45909 Unspecified asthma, uncomplicated: Secondary | ICD-10-CM | POA: Diagnosis not present

## 2014-03-15 DIAGNOSIS — F17200 Nicotine dependence, unspecified, uncomplicated: Secondary | ICD-10-CM | POA: Diagnosis not present

## 2014-05-06 ENCOUNTER — Ambulatory Visit (HOSPITAL_COMMUNITY): Payer: Self-pay | Admitting: Psychiatry

## 2014-05-16 DIAGNOSIS — H26493 Other secondary cataract, bilateral: Secondary | ICD-10-CM | POA: Diagnosis not present

## 2014-05-22 DIAGNOSIS — H26491 Other secondary cataract, right eye: Secondary | ICD-10-CM | POA: Diagnosis not present

## 2014-05-24 ENCOUNTER — Ambulatory Visit (INDEPENDENT_AMBULATORY_CARE_PROVIDER_SITE_OTHER): Payer: Medicare HMO | Admitting: Psychiatry

## 2014-05-24 ENCOUNTER — Encounter (HOSPITAL_COMMUNITY): Payer: Self-pay | Admitting: Psychiatry

## 2014-05-24 VITALS — BP 107/67 | HR 69 | Ht 62.5 in | Wt 171.2 lb

## 2014-05-24 DIAGNOSIS — F909 Attention-deficit hyperactivity disorder, unspecified type: Secondary | ICD-10-CM

## 2014-05-24 DIAGNOSIS — F319 Bipolar disorder, unspecified: Secondary | ICD-10-CM | POA: Diagnosis not present

## 2014-05-24 MED ORDER — LAMOTRIGINE 150 MG PO TABS: 150.0000 mg | ORAL_TABLET | Freq: Two times a day (BID) | ORAL | Status: DC

## 2014-05-24 MED ORDER — VENLAFAXINE HCL ER 150 MG PO CP24: 150.0000 mg | ORAL_CAPSULE | Freq: Every day | ORAL | Status: DC

## 2014-05-24 MED ORDER — TRAZODONE HCL 100 MG PO TABS: ORAL_TABLET | ORAL | Status: DC

## 2014-05-24 NOTE — Progress Notes (Signed)
[facility] Progress Note   Anita Arroyo [id number] 54 y.o.  05/24/2014 12:04 PM  Chief Complaint:  Medication management of followup.  History of Present Illness:  Anita Arroyo came for her followup appointment.  She is taking her medication and denies any side effects.  She has no rash itching or any tremors.  She reported her anxiety and depression is stable on current medication and she does not want to change it.  She is excited because she is going to weeks with her friend for medication.  She is happy because her friend is helping her.  Patient is sleeping good.  She denies any crying spells, agitation, irritability or any feeling of hopelessness.  She is getting better after Surgery however sometime she has to take medication .  Patient denies any hallucination paranoia or any self abusive behavior.  Her appetite is good.  Her vitals are stable.  Patient lives by herself.  She has no children.  She continued to have good relationship with her adopted and her logical mother.  She denies drinking or using any illegal substances.  Suicidal Ideation: No Plan Formed: No Patient has means to carry out plan: No  Homicidal Ideation: No Plan Formed: No Patient has means to carry out plan: No  Past Psychiatric History/Hospitalization(s) Patient is diagnosed with bipolar disorder and she had a history of mood swings, anger, impulsive behavior.  She had tried Depakote, Prozac, Paxil, Zoloft and Topamax.  She was seen at [facility] for more than 10 years but she was not happy there.  She denies any history of psychosis, paranoia but endorsed history of severe mania and depression.  Patient denies any history of physical, sexual or verbal abuse.  She was also diagnosed with ADD but a stimulant cause worsening of bipolar disorder.  She had tried Ritalin and Adderall in the past. Anxiety: Yes Bipolar Disorder: Yes Depression: Yes Mania: Yes Psychosis: No Schizophrenia: No Personality  Disorder: No Hospitalization for psychiatric illness: No History of Electroconvulsive Shock Therapy: No Prior Suicide Attempts: No  Medical History; She has multiple medical problems.  She has fibromyalgia, GERD, breast cancer, hyperlipidemia, right knee replacement, shoulder pain and heart murmur.  Her primary care physician is Dr. Nehemiah Settle.  Review of Systems: Psychiatric: Agitation: No Hallucination: No Depressed Mood: No Insomnia: No Hypersomnia: No Altered Concentration: No Feels Worthless: No Grandiose Ideas: No Belief In Special Powers: No New/Increased Substance Abuse: No Compulsions: No  Neurologic: Headache: No Seizure: No Paresthesias: No    Outpatient Encounter Prescriptions as of 05/24/2014  Medication Sig  . ADVAIR DISKUS 250-50 MCG/DOSE AEPB Inhale 1 puff into the lungs 2 (two) times daily.   Marland Kitchen atenolol (TENORMIN) 50 MG tablet Take 1 tablet (50 mg total) by mouth 2 (two) times daily.  . Azelastine HCl (ASTEPRO) 0.15 % SOLN Place 1 spray into the nose 2 (two) times daily.   . cetirizine (ZYRTEC) 10 MG tablet Take 10 mg by mouth daily.   Marland Kitchen ezetimibe (ZETIA) 10 MG tablet Take 1 tablet (10 mg total) by mouth daily.  Marland Kitchen HYDROcodone-acetaminophen (NORCO/VICODIN) 5-325 MG per tablet Take 1 tablet by mouth 4 (four) times daily.   Marland Kitchen lamoTRIgine (LAMICTAL) 150 MG tablet Take 1 tablet (150 mg total) by mouth 2 (two) times daily.  . pantoprazole (PROTONIX) 40 MG tablet Take 1 tablet by mouth daily.  . prednisoLONE acetate (PRED FORTE) 1 % ophthalmic suspension Place 1 drop into both eyes daily.  Marland Kitchen PROLENSA 0.07 % SOLN  Place 1 drop into both eyes daily.  . tamoxifen (NOLVADEX) 20 MG tablet TAKE 1 TABLET BY MOUTH DAILY  . traZODone (DESYREL) 100 MG tablet Takes 2 tab at bed time  . triamterene-hydrochlorothiazide (DYAZIDE) 37.5-25 MG per capsule Take by mouth as directed.    . venlafaxine XR (EFFEXOR-XR) 150 MG 24 hr capsule Take 1 capsule (150 mg total) by mouth  daily.  . vitamin E 400 UNIT capsule Take 400 Units by mouth daily.  . [DISCONTINUED] lamoTRIgine (LAMICTAL) 150 MG tablet Take 1 tablet (150 mg total) by mouth 2 (two) times daily.  . [DISCONTINUED] traZODone (DESYREL) 100 MG tablet Takes 2 tab at bed time  . [DISCONTINUED] venlafaxine XR (EFFEXOR-XR) 150 MG 24 hr capsule Take 1 capsule (150 mg total) by mouth daily.    No results found for this or any previous visit (from the past 2160 hour(s)).    Physical Exam: Constitutional:  BP 107/67 mmHg  Pulse 69  Ht 5' 2.5" (1.588 m)  Wt 171 lb 3.2 oz (77.656 kg)  BMI 30.79 kg/m2  Musculoskeletal: Strength & Muscle Tone: within normal limits Gait & Station: normal Patient leans: N/A  Mental Status Examination;  Patient is casually dressed and fairly groomed.  She is pleasant and cooperative.  She maintained good eye contact.  She describes her good and her affect is improved from the past.  Her attention and concentration is fair.  Her speech is clear and coherent. Her thought process is logical and goal-directed.  She denies any auditory or visual hallucination.  She denies any active or passive suicidal thoughts or homicidal thoughts.  There were no delusions, paranoia or any obsessive thoughts. She is alert and oriented x3.  Her psychomotor activity is normal.  Her fund of knowledge is adequate.  Her insight judgment and impulse control is okay.   Established Problem, Stable/Improving (1), Review of Psycho-Social Stressors (1), Review of Last Therapy Session (1) and Review of Medication Regimen & Side Effects (2)  Assessment: Axis I: Bipolar disorder type I, ADHD by history  Axis II: Deferred  Axis III:  Past Medical History  Diagnosis Date  . Heart murmur   . ADHD (attention deficit hyperactivity disorder)   . Bipolar 1 disorder   . Arthritis   . Meniere disease   . Neuromuscular disorder     fibromyalgia  . Fibromyalgia   . GERD (gastroesophageal reflux disease)   .  Depression   . Bipolar 1 disorder   . MVP (mitral valve prolapse)   . Hyperlipemia   . Breast cancer   . Hx of echocardiogram 04/28/2011    showed normal systolic function with mild diastolic dysfunction,she had trace MR but did not have frank mitral valve prolapse demonstrated. She had mild pulmonary hypertension with an estimated RV systolic pressure at 34 mm.  . History of stress test 04/28/2011    showed normal perfusion without scar or ischemia  . Dysrhythmia     palpitations  . Dysrhythmia     atrial fibrillation    Plan:  Patient is doing better on her current psychotropic medication.  She has no rash or itching.  Continue Effexor 150 mg daily, trazodone 200 mg daily and Lamictal 150 mg twice a day.  She prefers Management consultant at [employer].  Recommended to call us back if she has any question or any concern.  I will see her again in 3 months.   Jim Philemon T., MD 05/24/2014

## 2014-06-06 ENCOUNTER — Telehealth: Payer: Self-pay | Admitting: Cardiovascular Disease

## 2014-06-06 NOTE — Telephone Encounter (Signed)
Spoke to patient  Inquired if we can be any assistance? Patient states she wanted DR Claiborne Billings to contact her. RN informed patient , Dr Claiborne Billings would like for triage to assist. He is with patient at present time.lik Patient states she would like to talk to him only and she hung up.

## 2014-06-06 NOTE — Telephone Encounter (Signed)
Pt called in wanting to speak with Dr. Claiborne Billings about why her BP is running low and her medication does not seem to be helping. Please call  Thank

## 2014-06-09 NOTE — Telephone Encounter (Signed)
I personally called pt the same day

## 2014-06-13 DIAGNOSIS — J019 Acute sinusitis, unspecified: Secondary | ICD-10-CM | POA: Diagnosis not present

## 2014-06-13 DIAGNOSIS — R002 Palpitations: Secondary | ICD-10-CM | POA: Diagnosis not present

## 2014-06-13 DIAGNOSIS — H8101 Meniere's disease, right ear: Secondary | ICD-10-CM | POA: Diagnosis not present

## 2014-06-13 DIAGNOSIS — I959 Hypotension, unspecified: Secondary | ICD-10-CM | POA: Diagnosis not present

## 2014-07-15 DIAGNOSIS — Z23 Encounter for immunization: Secondary | ICD-10-CM | POA: Diagnosis not present

## 2014-07-15 DIAGNOSIS — E785 Hyperlipidemia, unspecified: Secondary | ICD-10-CM | POA: Diagnosis not present

## 2014-07-15 DIAGNOSIS — F319 Bipolar disorder, unspecified: Secondary | ICD-10-CM | POA: Diagnosis not present

## 2014-07-15 DIAGNOSIS — R87622 Low grade squamous intraepithelial lesion on cytologic smear of vagina (LGSIL): Secondary | ICD-10-CM | POA: Diagnosis not present

## 2014-07-15 DIAGNOSIS — Z86010 Personal history of colonic polyps: Secondary | ICD-10-CM | POA: Diagnosis not present

## 2014-07-15 DIAGNOSIS — Z0000 Encounter for general adult medical examination without abnormal findings: Secondary | ICD-10-CM | POA: Diagnosis not present

## 2014-07-15 DIAGNOSIS — Z01419 Encounter for gynecological examination (general) (routine) without abnormal findings: Secondary | ICD-10-CM | POA: Diagnosis not present

## 2014-07-18 ENCOUNTER — Other Ambulatory Visit: Payer: Self-pay | Admitting: Cardiovascular Disease

## 2014-07-25 ENCOUNTER — Ambulatory Visit: Payer: Commercial Managed Care - HMO | Admitting: Cardiovascular Disease

## 2014-07-25 DIAGNOSIS — R42 Dizziness and giddiness: Secondary | ICD-10-CM | POA: Diagnosis not present

## 2014-07-25 DIAGNOSIS — H6121 Impacted cerumen, right ear: Secondary | ICD-10-CM | POA: Diagnosis not present

## 2014-07-25 DIAGNOSIS — W108XXA Fall (on) (from) other stairs and steps, initial encounter: Secondary | ICD-10-CM | POA: Diagnosis not present

## 2014-07-25 DIAGNOSIS — T148 Other injury of unspecified body region: Secondary | ICD-10-CM | POA: Diagnosis not present

## 2014-07-30 ENCOUNTER — Other Ambulatory Visit: Payer: Self-pay | Admitting: *Deleted

## 2014-07-30 MED ORDER — ATENOLOL 50 MG PO TABS: 50.0000 mg | ORAL_TABLET | Freq: Two times a day (BID) | ORAL | Status: DC

## 2014-07-30 NOTE — Telephone Encounter (Signed)
Rx(s) sent to pharmacy electronically.  

## 2014-08-01 ENCOUNTER — Other Ambulatory Visit: Payer: Self-pay | Admitting: *Deleted

## 2014-08-01 DIAGNOSIS — D1801 Hemangioma of skin and subcutaneous tissue: Secondary | ICD-10-CM | POA: Diagnosis not present

## 2014-08-01 DIAGNOSIS — D225 Melanocytic nevi of trunk: Secondary | ICD-10-CM | POA: Diagnosis not present

## 2014-08-01 DIAGNOSIS — L821 Other seborrheic keratosis: Secondary | ICD-10-CM | POA: Diagnosis not present

## 2014-08-01 DIAGNOSIS — L814 Other melanin hyperpigmentation: Secondary | ICD-10-CM | POA: Diagnosis not present

## 2014-08-12 ENCOUNTER — Encounter: Payer: Self-pay | Admitting: Genetic Counselor

## 2014-08-12 ENCOUNTER — Ambulatory Visit: Payer: Self-pay | Admitting: Cardiovascular Disease

## 2014-08-13 ENCOUNTER — Ambulatory Visit: Payer: Self-pay | Admitting: Oncology

## 2014-08-13 ENCOUNTER — Other Ambulatory Visit: Payer: Self-pay

## 2014-08-19 ENCOUNTER — Other Ambulatory Visit: Payer: Self-pay | Admitting: *Deleted

## 2014-08-19 DIAGNOSIS — C50919 Malignant neoplasm of unspecified site of unspecified female breast: Secondary | ICD-10-CM

## 2014-08-20 ENCOUNTER — Ambulatory Visit (HOSPITAL_BASED_OUTPATIENT_CLINIC_OR_DEPARTMENT_OTHER): Payer: Commercial Managed Care - HMO | Admitting: Nurse Practitioner

## 2014-08-20 ENCOUNTER — Encounter: Payer: Self-pay | Admitting: Nurse Practitioner

## 2014-08-20 ENCOUNTER — Other Ambulatory Visit (HOSPITAL_BASED_OUTPATIENT_CLINIC_OR_DEPARTMENT_OTHER): Payer: Commercial Managed Care - HMO

## 2014-08-20 ENCOUNTER — Ambulatory Visit: Payer: Commercial Managed Care - HMO

## 2014-08-20 ENCOUNTER — Telehealth: Payer: Self-pay | Admitting: Nurse Practitioner

## 2014-08-20 VITALS — BP 129/66 | HR 69 | Temp 98.7°F | Resp 18 | Ht 62.5 in | Wt 172.2 lb

## 2014-08-20 DIAGNOSIS — Z853 Personal history of malignant neoplasm of breast: Secondary | ICD-10-CM | POA: Diagnosis not present

## 2014-08-20 DIAGNOSIS — C50919 Malignant neoplasm of unspecified site of unspecified female breast: Secondary | ICD-10-CM

## 2014-08-20 DIAGNOSIS — C50911 Malignant neoplasm of unspecified site of right female breast: Secondary | ICD-10-CM

## 2014-08-20 LAB — CBC WITH DIFFERENTIAL/PLATELET
BASO%: 1 % (ref 0.0–2.0)
Basophils Absolute: 0.1 10*3/uL (ref 0.0–0.1)
EOS%: 1.2 % (ref 0.0–7.0)
Eosinophils Absolute: 0.1 10*3/uL (ref 0.0–0.5)
HCT: 40.3 % (ref 34.8–46.6)
HEMOGLOBIN: 13.7 g/dL (ref 11.6–15.9)
LYMPH#: 2 10*3/uL (ref 0.9–3.3)
LYMPH%: 34.7 % (ref 14.0–49.7)
MCH: 32 pg (ref 25.1–34.0)
MCHC: 34 g/dL (ref 31.5–36.0)
MCV: 94.1 fL (ref 79.5–101.0)
MONO#: 0.5 10*3/uL (ref 0.1–0.9)
MONO%: 8.7 % (ref 0.0–14.0)
NEUT%: 54.4 % (ref 38.4–76.8)
NEUTROS ABS: 3.1 10*3/uL (ref 1.5–6.5)
Platelets: 179 10*3/uL (ref 145–400)
RBC: 4.28 10*6/uL (ref 3.70–5.45)
RDW: 13.2 % (ref 11.2–14.5)
WBC: 5.6 10*3/uL (ref 3.9–10.3)

## 2014-08-20 LAB — COMPREHENSIVE METABOLIC PANEL (CC13)
ALT: 18 U/L (ref 0–55)
AST: 22 U/L (ref 5–34)
Albumin: 3.9 g/dL (ref 3.5–5.0)
Alkaline Phosphatase: 54 U/L (ref 40–150)
Anion Gap: 8 mEq/L (ref 3–11)
BUN: 16 mg/dL (ref 7.0–26.0)
CO2: 26 mEq/L (ref 22–29)
CREATININE: 0.9 mg/dL (ref 0.6–1.1)
Calcium: 9.4 mg/dL (ref 8.4–10.4)
Chloride: 104 mEq/L (ref 98–109)
EGFR: 76 mL/min/{1.73_m2} — ABNORMAL LOW (ref >90)
Glucose: 92 mg/dl (ref 70–140)
Potassium: 4.7 mEq/L (ref 3.5–5.1)
Sodium: 138 mEq/L (ref 136–145)
Total Bilirubin: 0.47 mg/dL (ref 0.20–1.20)
Total Protein: 6.6 g/dL (ref 6.4–8.3)

## 2014-08-20 NOTE — Telephone Encounter (Signed)
Appointments made and avs pritned for patient,she will see dr Harlow Mares 7/29 at 10:30

## 2014-08-20 NOTE — Progress Notes (Signed)
[facility]  [phone] [phone]  OFFICE PROGRESS NOTE   ID: [name]   DOB: 04/16/1960  MR#: [id number]  MLY#:[id number]   PCP: Dorian Heckle, MD GYN:  SU:  OTHER MD: Floria Raveling, Filbert Berthold  CHIEF COMPLAINT: right breast cancer  CURRENT TREATMENT: tamoxifen   BREAST CANCER HISTORY: From Dr. Collier Salina Rubin's new patient evaluation note dated 06/18/2009: "This is a pleasant 54 year old woman from [place] who is here with Anita Arroyo mother for evaluation and treatment of breast cancer.  This woman has been in reasonably good health.  She does have a history of ADHD and bipolar disease.  She has not had a mammogram in about two years.  She sought medical attention because of intense itchiness of Anita Arroyo left nipple.  She subsequently had a screening mammogram on 04/22/2009, which showed calcifications of right breast visual views recommended.  A diagnostic mammogram and ultrasound was performed on 04/28/2009.  Physical exam did not reveal any abnormalities.  Anita Arroyo ultrasound did show multiple hypoechoic irregularities of the right breast.  Most of these were simple cysts.  A suspicious irregular hypoechoic lesion was seen measuring 2.3 x 0.7 x 1.1 cm.  Calcifications of the right breast with distortion were also seen.  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  A subsequent MRI scan of both breasts was performed on 05/13/2009. In the right breast at 12 o'clock position there was irregular area of enhancement measuring 1.7 x 1.6 x 1.3 cm.  No abnormal lymph nodes were seen and the other breast was normal.  The patient did have some intolerance to the contrast material.  The patient ultimately underwent a lumpectomy and sentinel lymph node evaluation on 06/02/2009.  Initially, the feeling was that this was an invasive and in situ ductal carcinoma with three benign sentinel lymph nodes,  total area was 1.5 cm, margins were uninvolved.  Histological grade was 2.  The subsequent reevaluation of this material after special stains were performed suggested that this was in fact all DCIS, margins were uninvolved.  ER/PR positive at 89% and 35% respectively.  Three sentinel lymph nodes were again negative for malignancy.  The patient has had an unremarkable postoperative course."  Anita Arroyo subsequent history is as detailed below.   INTERVAL HISTORY: [name] returns for follow up of Anita Arroyo breast cancer. She has been on tamoxifen since September 2009 and has hot flashes that can be intense, but she has managed them thus far, and is almost done with Anita Arroyo treatment. She uses vitamin E and this is mildly helpful. The interval history is generally unremarkable. Anita Arroyo last period was in 2012 and she wonders if she is fully menopausal now.   REVIEW OF SYSTEMS: Wyolene denies fevers, chills, nausea, vomiting, or changes in bowel or bladder habits. She continues to smoke, and has no intentions of quitting. She has some shortness of breath and chronic cough, but denies chest pain, or palpitations. She has fibromyalgia and uses narcotics for the pain PRN. She had cataract surgery in January. She is hard of hearing. She is diagnosed with ADHD and bipolar disorder and follows up with Anita Arroyo psychiatrist regularly. A detailed review of systems is otherwise stable.  PAST MEDICAL HISTORY: Past Medical History  Diagnosis Date  . Heart murmur   . ADHD (attention deficit hyperactivity disorder)   . Bipolar 1 disorder   . Arthritis   . Meniere disease   . Neuromuscular  disorder     fibromyalgia  . Fibromyalgia   . GERD (gastroesophageal reflux disease)   . Depression   . Bipolar 1 disorder   . MVP (mitral valve prolapse)   . Hyperlipemia   . Breast cancer   . Hx of echocardiogram 04/28/2011    showed normal systolic function with mild diastolic dysfunction,she had trace MR but did not have frank mitral valve prolapse  demonstrated. She had mild pulmonary hypertension with an estimated RV systolic pressure at 34 mm.  . History of stress test 04/28/2011    showed normal perfusion without scar or ischemia  . Dysrhythmia     palpitations  . Dysrhythmia     atrial fibrillation  Significant for history of the left ankle surgery x4, history of arthritis, history of fiber polyp removed in February 2011 from the uterus, history of left wrist surgery, history of right elbow surgery.  History of ADHD diagnosed 10 years ago.  She has a history of Mnire's diagnosed a number of years ago.   PAST SURGICAL HISTORY: Past Surgical History  Procedure Laterality Date  . Tonsillectomy    . Deviated septum    . Ectopic pregnancy surgery    . Tmj arthroplasty    . Uterine fibroid surgery      polups and fibroids removed   . Elbow surgery      right elbow  . Wrist surgery      left  . Ankle surgery      left x4  . Knee surgery      left knee  . Fibroid      2-3 fibroid adenomas removed  . Joint replacement  2011    right knee   . Breast surgery  2011    lumpectomy right breast   . Eye surgery      cataract surgery  . Colonoscopy with propofol N/A 02/25/2014    Procedure: COLONOSCOPY WITH PROPOFOL;  Surgeon: Garlan Fair, MD;  Location: [facility];  Service: Endoscopy;  Laterality: N/A;    FAMILY HISTORY Family History  Problem Relation Age of Onset  . Mitral valve prolapse Mother   . Heart Problems Mother   . Cancer Mother    The patient is adopted. However she has contacted Anita Arroyo biological mother, "Anita Arroyo". Girdle was diagnosed with Breast cancer (? the patient is not sure) before the age of 31. Marital was only 14 when she had the patient. The patient has not contacted Anita Arroyo biological father.  GYNECOLOGIC HISTORY: Gravida 0, para 0, menarche at 89 or 74, last menstrual period was March of 2013.  No history of hormone replacement therapy.  SOCIAL HISTORY: The patient is single.  She is on  disability secondary to Anita Arroyo bipolar disease.  She works at [employer], and does some gardening. She has 4 cats at home.   ADVANCED DIRECTIVES:  HEALTH MAINTENANCE: History  Substance Use Topics  . Smoking status: Current Every Day Smoker -- 1.50 packs/day for 30 years    Types: Cigarettes  . Smokeless tobacco: Never Used  . Alcohol Use: 0.0 oz/week    0 Standard drinks or equivalent per week     Comment: occ    Colonoscopy: PAP: Bone density: Lipid panel:  Allergies  Allergen Reactions  . Contrast Media [Iodinated Diagnostic Agents] Shortness Of Breath  . Iodine Shortness Of Breath  . Adhesive [Tape]     blister  . Amoxicillin-Pot Clavulanate Rash  . Ceclor [Cefaclor] Rash  . Latex  nausea  . Moxifloxacin Rash  . Sulfonamide Derivatives Nausea And Vomiting    Current Outpatient Prescriptions  Medication Sig Dispense Refill  . ADVAIR DISKUS 250-50 MCG/DOSE AEPB Inhale 1 puff into the lungs 2 (two) times daily.     Marland Kitchen atenolol (TENORMIN) 50 MG tablet Take 1 tablet (50 mg total) by mouth 2 (two) times daily. 180 tablet 1  . cetirizine (ZYRTEC) 10 MG tablet Take 10 mg by mouth daily.     Marland Kitchen HYDROcodone-acetaminophen (NORCO/VICODIN) 5-325 MG per tablet Take 1 tablet by mouth 4 (four) times daily.     Marland Kitchen lamoTRIgine (LAMICTAL) 150 MG tablet Take 1 tablet (150 mg total) by mouth 2 (two) times daily. 180 tablet 0  . pantoprazole (PROTONIX) 40 MG tablet Take 1 tablet by mouth daily.    . tamoxifen (NOLVADEX) 20 MG tablet TAKE 1 TABLET BY MOUTH DAILY 90 tablet 2  . traZODone (DESYREL) 100 MG tablet Takes 2 tab at bed time 180 tablet 0  . triamterene-hydrochlorothiazide (DYAZIDE) 37.5-25 MG per capsule Take by mouth daily. Pt takes 1/2 tablet daily.    Marland Kitchen venlafaxine XR (EFFEXOR-XR) 150 MG 24 hr capsule Take 1 capsule (150 mg total) by mouth daily. 90 capsule 0  . vitamin E 400 UNIT capsule Take 400 Units by mouth daily.    Marland Kitchen ezetimibe (ZETIA) 10 MG tablet Take 1 tablet (10 mg  total) by mouth daily. (Patient not taking: Reported on 08/20/2014) 14 tablet 0   No current facility-administered medications for this visit.    OBJECTIVE: Middle-aged white woman in no acute distress Filed Vitals:   08/20/14 1129  BP: 129/66  Pulse: 69  Temp: 98.7 F (37.1 C)  Resp: 18     Body mass index is 30.97 kg/(m^2).      ECOG FS: 1  Skin: warm, dry  HEENT: sclerae anicteric, conjunctivae pink, oropharynx clear. No thrush or mucositis.  Lymph Nodes: No cervical or supraclavicular lymphadenopathy  Lungs: clear to auscultation bilaterally, no rales, wheezes, or rhonci  Heart: regular rate and rhythm  Abdomen: round, soft, non tender, positive bowel sounds  Musculoskeletal: No focal spinal tenderness, no peripheral edema  Neuro: non focal, well oriented, positive affect  Breasts: right breast status post lumpectomy and radiation. No evidence of recurrent disease. Right axilla benign. Left breast unremarkable.    LAB RESULTS: Lab Results  Component Value Date   WBC 5.6 08/20/2014   NEUTROABS 3.1 08/20/2014   HGB 13.7 08/20/2014   HCT 40.3 08/20/2014   MCV 94.1 08/20/2014   PLT 179 08/20/2014      Chemistry      Component Value Date/Time   NA 138 08/20/2014 1117   NA 137 11/22/2013 0904   K 4.7 08/20/2014 1117   K 4.4 11/22/2013 0904   CL 104 11/22/2013 0904   CL 104 06/29/2012 1221   CO2 26 08/20/2014 1117   CO2 25 11/22/2013 0904   BUN 16.0 08/20/2014 1117   BUN 15 11/22/2013 0904   CREATININE 0.9 08/20/2014 1117   CREATININE 0.90 11/22/2013 0904   CREATININE 0.87 10/12/2011 1555      Component Value Date/Time   CALCIUM 9.4 08/20/2014 1117   CALCIUM 9.1 11/22/2013 0904   ALKPHOS 54 08/20/2014 1117   ALKPHOS 46 11/22/2013 0904   AST 22 08/20/2014 1117   AST 23 11/22/2013 0904   ALT 18 08/20/2014 1117   ALT 16 11/22/2013 0904   BILITOT 0.47 08/20/2014 1117   BILITOT 0.5 11/22/2013 0904  Lab Results  Component Value Date   LABCA2 19  12/31/2009    Urinalysis    Component Value Date/Time   COLORURINE YELLOW 02/13/2010 1130   APPEARANCEUR HAZY* 02/13/2010 1130   LABSPEC 1.017 02/13/2010 1130   PHURINE 7.0 02/13/2010 1130   GLUCOSEU NEGATIVE 04/25/2007 2336   HGBUR SMALL* 02/13/2010 1130   BILIRUBINUR NEGATIVE 02/13/2010 1130   KETONESUR NEGATIVE 02/13/2010 1130   PROTEINUR NEGATIVE 02/13/2010 1130   UROBILINOGEN 0.2 02/13/2010 1130   NITRITE NEGATIVE 02/13/2010 1130   LEUKOCYTESUR NEGATIVE 02/13/2010 1130    STUDIES: No results found.   ASSESSMENT: 54 y.o. woman: 1.  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  2.  Status post bilateral breast MRI on 05/13/2009 which showed an irregular mass-like area of enhancement which was more prominent than the surrounding background parenchymal enhancement pattern.  The irregular area measured 1.7 x 1.6 x 1.3 cm.  No definite multicentric or multifocal disease was identified in the right breast.  No dominant mass or asymmetric area of enhancement was identified in the left breast to suggest malignancy.  Negative for axillary or internal mammary chain lymphadenopathy.  3.  Status post right breast lumpectomy with right axillary lymph node sentinel biopsy on 06/02/2009 for DCIS (initially read as invasive, but later revised as showing only non-invasive disease)  4.  Status post radiation therapy from 07/07/2009 through 08/19/2009.  5.  Comprehensive [facility] analysis report dated 10/09/2010 showed the patient was negative for the BRCA1 and BRCA2 gene mutations.  6.  The patient started Tamoxifen in 09/2009.   PLAN: Ashlei is doing well as far as Anita Arroyo breast cancer is concerned. She is now 5 years out from Anita Arroyo definitive surgery with no evidence of recurrent disease. She has just 2 months left on tamoxifen to  complete 5 years of antiestrogen therapy. She has enough pills to cover Anita Arroyo during this time, so she will finish out Anita Arroyo remaining supply, and then discontinue use of this drug.   She was interested in plastic surgery to Anita Arroyo right breast because she believes it has changed since surgery, so I am sending Anita Arroyo to Dr. Harlow Mares (she prefers not to return to [facility]).  She was curious about Anita Arroyo [place] and estradiol levels. We had these labs drawn today and she will call us back next week for the results.   Maylie and I discussed "graduating" from follow up visits. At this time she will return to Anita Arroyo PCP or GYN for all routine follow up visits. She knows she is due for a repeat mammogram in September. Of course we keep Anita Arroyo records for 10 years, and will be available for any questions or concerns at Anita Arroyo request. Stanton Kidney understands and agrees with this plan. She has been encouraged to call with any issues that might arise. It has been a pleasure caring for this patient.   Laurie Panda, NP

## 2014-08-21 LAB — FOLLICLE STIMULATING HORMONE: FSH: 25.2 m[IU]/mL

## 2014-08-22 DIAGNOSIS — G43809 Other migraine, not intractable, without status migrainosus: Secondary | ICD-10-CM | POA: Diagnosis not present

## 2014-08-22 DIAGNOSIS — H43393 Other vitreous opacities, bilateral: Secondary | ICD-10-CM | POA: Diagnosis not present

## 2014-08-23 ENCOUNTER — Ambulatory Visit (HOSPITAL_COMMUNITY): Payer: Self-pay | Admitting: Psychiatry

## 2014-08-24 LAB — ESTRADIOL, ULTRA SENS: Estradiol, Ultra Sensitive: 6 pg/mL

## 2014-08-28 ENCOUNTER — Ambulatory Visit (INDEPENDENT_AMBULATORY_CARE_PROVIDER_SITE_OTHER): Payer: Commercial Managed Care - HMO | Admitting: Cardiovascular Disease

## 2014-08-28 ENCOUNTER — Encounter: Payer: Self-pay | Admitting: Cardiovascular Disease

## 2014-08-28 VITALS — BP 98/60 | Ht 62.5 in | Wt 171.0 lb

## 2014-08-28 DIAGNOSIS — R002 Palpitations: Secondary | ICD-10-CM | POA: Diagnosis not present

## 2014-08-28 DIAGNOSIS — I10 Essential (primary) hypertension: Secondary | ICD-10-CM

## 2014-08-28 DIAGNOSIS — E785 Hyperlipidemia, unspecified: Secondary | ICD-10-CM

## 2014-08-28 DIAGNOSIS — Z87891 Personal history of nicotine dependence: Secondary | ICD-10-CM

## 2014-08-28 NOTE — Progress Notes (Signed)
Patient ID: Anita Arroyo, female   DOB: 11-06-1960, 54 y.o.   MRN: [id number]     HPI: Anita Arroyo is a 54 y.o. female who presents for one-year cardiology evaluation.   I initially saw Anita Arroyo in March 2013 for evaluation of episodes of chest tightness with walking. She has a long-standing history of ongoing tobacco use, and reportedly it had a history of possible mitral valve prolapse. An echo Doppler study in April 2013 showed normal systolic function with grade 1 diastolic dysfunction. She had trace mitral regurgitation and frank mitral valve prolapse was not demonstrated. She did have mild pulmonary hypertension with estimated systolic pressure 34 mm. A nuclear perfusion study revealed normal perfusion.  She as ahistory of hyperlipidemia which was treated with Zetia. She has undergone left shoulder surgery by Dr. Percell Miller and had a ruptured by sepsis tendon. 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 Percell Miller, and history of bipolar polar disorder followed by Dr. Lovena Le as well as fibromyalgia.  Last year she lost approximately 21 pounds.  She has seen a nutritionist.  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  Unfortunately, she continues to smoke approximately 1-1/2ppd. She never had the prescription for Chantix filled.  She does have Mnire's disease for which she takes Dyazide.  She is on trazodone 200 mg at bedtime and also takes Lamictal 150 mg twice a day in addition to Effexor XR for her bipolar history.  She continues to take tamoxifen with her history of breast CA.  She has a history of palpitations and has been taking atenolol 50 mg in the morning and 25 mg at night.  Several months ago, she was having increasing palpitations.  She now is taking atenolol 50 mg twice a  day with improvement.  He denies any presyncope or syncope.    Last year a comprehensive metabolic panel, CBC, thyroid function studies were all entirely normal.  Lipid studies were excellent with a total cholesterol 144, triglycerides 80, HDL 49, LDL 79.  She will be establishing with a new primary care physician.  She presents now for evaluation.    Past Medical History  Diagnosis Date  . Heart murmur   . ADHD (attention deficit hyperactivity disorder)   . Bipolar 1 disorder   . Arthritis   . Meniere disease   . Neuromuscular disorder     fibromyalgia  . Fibromyalgia   . GERD (gastroesophageal reflux disease)   . Depression   . Bipolar 1 disorder   . MVP (mitral valve prolapse)   . Hyperlipemia   . Breast cancer   . Hx of echocardiogram 04/28/2011    showed normal systolic function with mild diastolic dysfunction,she had trace MR but did not have frank mitral valve prolapse demonstrated. She had mild pulmonary hypertension with an estimated RV systolic pressure at 34 mm.  . History of stress test 04/28/2011    showed normal perfusion without scar or ischemia  . Dysrhythmia     palpitations  . Dysrhythmia     atrial fibrillation    Past Surgical History  Procedure Laterality Date  . Tonsillectomy    . Deviated septum    . Ectopic pregnancy surgery    . Tmj arthroplasty    . Uterine fibroid surgery  polups and fibroids removed   . Elbow surgery      right elbow  . Wrist surgery      left  . Ankle surgery      left x4  . Knee surgery      left knee  . Fibroid      2-3 fibroid adenomas removed  . Joint replacement  2011    right knee   . Breast surgery  2011    lumpectomy right breast   . Eye surgery      cataract surgery  . Colonoscopy with propofol N/A 02/25/2014    Procedure: COLONOSCOPY WITH PROPOFOL;  Surgeon: Garlan Fair, MD;  Location: [facility];  Service: Endoscopy;  Laterality: N/A;    Allergies  Allergen Reactions  . Contrast Media  [Iodinated Diagnostic Agents] Shortness Of Breath  . Iodine Shortness Of Breath  . Adhesive [Tape]     blister  . Amoxicillin-Pot Clavulanate Rash  . Ceclor [Cefaclor] Rash  . Latex     nausea  . Moxifloxacin Rash  . Sulfonamide Derivatives Nausea And Vomiting    Current Outpatient Prescriptions  Medication Sig Dispense Refill  . ADVAIR DISKUS 250-50 MCG/DOSE AEPB Inhale 1 puff into the lungs 2 (two) times daily.     Marland Kitchen atenolol (TENORMIN) 50 MG tablet Take 1 tablet (50 mg total) by mouth 2 (two) times daily. 180 tablet 1  . Biotin 5000 MCG CAPS Take 1 capsule by mouth daily.    . cetirizine (ZYRTEC) 10 MG tablet Take 10 mg by mouth daily.     Marland Kitchen HYDROcodone-acetaminophen (NORCO/VICODIN) 5-325 MG per tablet Take 1 tablet by mouth 4 (four) times daily.     Marland Kitchen lamoTRIgine (LAMICTAL) 150 MG tablet Take 1 tablet (150 mg total) by mouth 2 (two) times daily. 180 tablet 0  . pantoprazole (PROTONIX) 40 MG tablet Take 1 tablet by mouth daily.    . tamoxifen (NOLVADEX) 20 MG tablet TAKE 1 TABLET BY MOUTH DAILY 90 tablet 2  . traZODone (DESYREL) 100 MG tablet Takes 2 tab at bed time 180 tablet 0  . triamterene-hydrochlorothiazide (DYAZIDE) 37.5-25 MG per capsule Take by mouth daily. Pt takes 1/2 tablet daily.    Marland Kitchen venlafaxine XR (EFFEXOR-XR) 150 MG 24 hr capsule Take 1 capsule (150 mg total) by mouth daily. 90 capsule 0   No current facility-administered medications for this visit.    History   Social History  . Marital Status: Single    Spouse Name: N/A  . Number of Children: N/A  . Years of Education: N/A   Occupational History  . Not on file.   Social History Main Topics  . Smoking status: Current Every Day Smoker -- 1.50 packs/day for 30 years    Types: Cigarettes  . Smokeless tobacco: Never Used  . Alcohol Use: 0.0 oz/week    0 Standard drinks or equivalent per week     Comment: occ  . Drug Use: No  . Sexual Activity: Not on file   Other Topics Concern  . Not on file    Social History Narrative    Family History  Problem Relation Age of Onset  . Mitral valve prolapse Mother   . Heart Problems Mother   . Cancer Mother    Socially she is single. She has no children. She has been smoking for approximately 38 years and still smokes 1-11/2 pack per day per. 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   ROS General:  Negative; No fevers, chills, or night sweats;  HEENT: Negative; No changes in vision or hearing, sinus congestion, difficulty swallowing Pulmonary: Negative; No cough, wheezing, shortness of breath, hemoptysis Cardiovascular: Negative; No chest pain, presyncope, syncope, palpitations GI: Negative; No nausea, vomiting, diarrhea, or abdominal pain GU: Negative; No dysuria, hematuria, or difficulty voiding Musculoskeletal: Negative; no myalgias, joint pain, or weakness Hematologic/Oncology: Negative; no easy bruising, bleeding Endocrine: Negative; no heat/cold intolerance; no diabetes Neuro: Negative; no changes in balance, headaches Skin: Negative; No rashes or skin lesions Psychiatric: Negative; No behavioral problems, depression Sleep: Negative; No snoring, daytime sleepiness, hypersomnolence, bruxism, restless legs, hypnogognic hallucinations, no cataplexy Other comprehensive 14 point system review is negative.   PE BP 98/60 mmHg  Ht 5' 2.5" (1.588 m)  Wt 171 lb (77.565 kg)  BMI 30.76 kg/m2   Repeat blood pressure by me was 120/64.  The right arm 120/62 in the left arm.  Her blood pressure decreased to 105/60 standing.  She was asymptomatic.  Wt Readings from Last 3 Encounters:  08/28/14 171 lb (77.565 kg)  08/20/14 172 lb 3.2 oz (78.109 kg)  05/24/14 171 lb 3.2 oz (77.656 kg)   General: Alert, oriented, no distress.  Skin: normal turgor, no rashes HEENT: Normocephalic, atraumatic. Pupils round and reactive; sclera anicteric;no lid lag.  Positive for Mnire's disease treated with Dyazide Nose without nasal septal  hypertrophy Mouth/Parynx benign; Mallinpatti scale 3 Neck: No JVD, no carotid bruits Lungs: clear to ausculatation and percussion; no wheezing or rales Chest wall: resolution of prior costochondral discomfort Heart: RRR, s1 s2 normal 1/6 systolic murmur; no audible click..  No diastolic murmur.  No rubs thrills or heaves. No ectopy. Abdomen: soft, nontender; no hepatosplenomehaly, BS+; abdominal aorta nontender and not dilated by palpation. Back: No CVA tenderness Pulses 2+ Extremities: no clubbing cyanosis or edema, Homan's sign negative  Neurologic: grossly nonfocal Psychologic: normal affect and mood.  ECG (independently read by me): Normal sinus rhythm at 65 bpm.  Mild RV conduction delay.  No securing ST-T changes.  May 2015 ECG (independently read by me): Normal sinus rhythm at 67 bpm.  QTc interval 443 Anita.  Nonspecific T-wave changes.  October 2014 ECG: Sinus rhythm at 69 beats per minute. Mild RV conduction delay. Normal intervals.  LABS: BMP Latest Ref Rng 08/20/2014 11/22/2013 08/06/2013  Glucose 70 - 140 mg/dl 92 88 109  BUN 7.0 - 26.0 mg/dL 16.0 15 13.9  Creatinine 0.6 - 1.1 mg/dL 0.9 0.90 0.9  Sodium 136 - 145 mEq/L 138 137 139  Potassium 3.5 - 5.1 mEq/L 4.7 4.4 3.3(L)  Chloride 96 - 112 mEq/L - 104 -  CO2 22 - 29 mEq/L 26 25 24   Calcium 8.4 - 10.4 mg/dL 9.4 9.1 9.3   Hepatic Function Latest Ref Rng 08/20/2014 11/22/2013 08/06/2013  Total Protein 6.4 - 8.3 g/dL 6.6 6.3 7.1  Albumin 3.5 - 5.0 g/dL 3.9 4.2 4.1  AST 5 - 34 U/L 22 23 25   ALT 0 - 55 U/L 18 16 25   Alk Phosphatase 40 - 150 U/L 54 46 51  Total Bilirubin 0.20 - 1.20 mg/dL 0.47 0.5 0.46   CBC Latest Ref Rng 08/20/2014 11/22/2013 08/06/2013  WBC 3.9 - 10.3 10e3/uL 5.6 6.5 7.3  Hemoglobin 11.6 - 15.9 g/dL 13.7 14.3 14.4  Hematocrit 34.8 - 46.6 % 40.3 40.8 41.6  Platelets 145 - 400 10e3/uL 179 190 188   Lab Results  Component Value Date   MCV 94.1 08/20/2014   MCV 91.7 11/22/2013  MCV 93.1 08/06/2013    Lab Results  Component Value Date   TSH 2.129 11/22/2013  No results found for: HGBA1C  Lipid Panel     Component Value Date/Time   CHOL 144 11/22/2013 0904   TRIG 80 11/22/2013 0904   HDL 49 11/22/2013 0904   CHOLHDL 2.9 11/22/2013 0904   VLDL 16 11/22/2013 0904   LDLCALC 79 11/22/2013 0904     RADIOLOGY: Dg Chest 2 View  11/10/2012   CLINICAL DATA:  Cough and chest pain  EXAM: CHEST  2 VIEW  COMPARISON:  July 30, 2012  FINDINGS: There is a calcified granuloma in the left lower lobe. Lungs are otherwise clear. Heart size and pulmonary vascularity are normal. No adenopathy. There are no appreciable bone lesions.  IMPRESSION: Granuloma left base. No edema or consolidation   Electronically Signed   By: Lowella Grip M.D.   On: 11/10/2012 15:22   Mr Shoulder Left Wo Contrast  11/12/2012   CLINICAL DATA:  Extreme shoulder pain for months. Prior shoulder surgery approximately 1 year ago.  EXAM: MRI OF THE LEFT SHOULDER WITHOUT CONTRAST  TECHNIQUE: Multiplanar, multisequence MR imaging of the shoulder was performed. No intravenous contrast was administered.  COMPARISON:  Left shoulder MRI 06/04/2011.  FINDINGS: Rotator cuff: Interval development of infraspinous tendinosis with hyperintensity extending to the musculotendinous junction. No focal tear is demonstrated. There is stable mild supraspinatus and subscapularis tendinosis.  Muscles:  No focal muscular atrophy or edema.  Biceps long head: The intra-articular portion of the biceps tendon is now diminutive with irregular signal consistent with tendinosis and partial tearing. No full-thickness tendon tear or tendon dislocation is identified.  Acromioclavicular Joint: The acromion is type 1. Patient has undergone interval distal clavicle resection and probable acromioplasty. There is no osseous encroachment on the rotator cuff passageway. There is no significant fluid in the subacromial -subdeltoid space.  Glenohumeral Joint: No significant  shoulder joint effusion. The previously demonstrated edema and cystic changes superiorly in the glenoid have improved. However, there is new edema and subchondral cyst formation in the anterior inferior glenoid.  Labrum: There is labral degeneration without evidence of discrete tear.  Bones: No significant extra-articular osseous findings. The cystic changes previously noted medial to the lesser tuberosity have improved.  IMPRESSION: 1. Interval distal clavicle resection and acromioplasty. No rotator cuff impingement identified. 2. Infraspinous tendinosis without evidence of rotator cuff tear. 3. New diminution of the long head of the biceps tendon consistent with partial tearing. 4. Progressive glenohumeral degenerative changes with new subchondral cyst formation and edema in the anterior inferior glenoid. No discrete labral tear identified.   Electronically Signed   By: Camie Patience M.D.   On: 11/12/2012 12:14      ASSESSMENT AND PLAN: Anita Arroyo is a 54 year old female with a long history of tobacco use. When I saw her last year, I recommended she undergo a chest x-ray which did not reveal any active disease and she did have present the previous granuloma in the left base of her lung.  She has a history of palpitations, but these seen mainly have improved with a dose of atenolol at 50 Milgram's twice a day.  I have suggested that if she does experience increasing episodes of palpitations that she can take an extra 25 mg on an as-needed basis.  She no longer is taking Zetia and apparently stopped this due to cost.  She is still smoking.  I discussed smoking cessation.  She does not have significant  edema and has been taking one half of a Dyazide pill.  GC take Advair.  There is no wheezing on exam today.  She has GERD which is controlled with Protonix.  She continues to take her bipolar medication.  She will be seeing her primary physician who will repeat laboratory.  I reviewed laboratory that had recently  been completed on 08/20/2014.  As long as she remains stable, I will see her in one year for reevaluation.  Troy Sine, MD, [facility]  08/28/2014 11:51 AM

## 2014-08-28 NOTE — Patient Instructions (Signed)
Your physician wants you to follow-up in: 1 year or sooner if needed. You will receive a reminder letter in the mail two months in advance. If you don't receive a letter, please call our office to schedule the follow-up appointment.  

## 2014-08-30 DIAGNOSIS — M25572 Pain in left ankle and joints of left foot: Secondary | ICD-10-CM | POA: Diagnosis not present

## 2014-09-09 ENCOUNTER — Ambulatory Visit (HOSPITAL_COMMUNITY): Payer: Self-pay | Admitting: Psychiatry

## 2014-09-10 ENCOUNTER — Other Ambulatory Visit (HOSPITAL_COMMUNITY): Payer: Self-pay | Admitting: Psychiatry

## 2014-09-10 DIAGNOSIS — F3162 Bipolar disorder, current episode mixed, moderate: Secondary | ICD-10-CM

## 2014-09-12 NOTE — Telephone Encounter (Signed)
Medication refill for Trazodone - patient also called to request this medicaiton be filled as soon as possible as she is out and was rescheduled from 09/09/14 to 09/25/14.  Would like a new 90 day order.  Last seen 05/24/14.

## 2014-09-13 ENCOUNTER — Other Ambulatory Visit (HOSPITAL_COMMUNITY): Payer: Self-pay | Admitting: Psychiatry

## 2014-09-13 NOTE — Telephone Encounter (Signed)
Met with Dr. Tadepalli who authorized a one time refill for 30 days of patient's prescribed Trazodone as Dr. Arfeen is out this date and e-scribed order into patient's [facility] on [address].   Left patient a message the order was sent to her pharmacy but would only be for 30 days and will see patient on 09/25/14 for next evaluation.  Requested patient call back if any problems filling order.  

## 2014-09-16 ENCOUNTER — Telehealth: Payer: Self-pay | Admitting: *Deleted

## 2014-09-16 ENCOUNTER — Telehealth: Payer: Self-pay | Admitting: Oncology

## 2014-09-16 NOTE — Telephone Encounter (Signed)
PT. WAS A NO SHOW TODAY AT DR.BOWERS' OFFICE.

## 2014-09-16 NOTE — Telephone Encounter (Signed)
FAXED [facility]

## 2014-09-25 ENCOUNTER — Ambulatory Visit (INDEPENDENT_AMBULATORY_CARE_PROVIDER_SITE_OTHER): Payer: Medicare HMO | Admitting: Psychiatry

## 2014-09-25 ENCOUNTER — Encounter (HOSPITAL_COMMUNITY): Payer: Self-pay | Admitting: Psychiatry

## 2014-09-25 VITALS — BP 118/68 | HR 72 | Ht 62.5 in | Wt 172.4 lb

## 2014-09-25 DIAGNOSIS — F3162 Bipolar disorder, current episode mixed, moderate: Secondary | ICD-10-CM

## 2014-09-25 DIAGNOSIS — F319 Bipolar disorder, unspecified: Secondary | ICD-10-CM | POA: Diagnosis not present

## 2014-09-25 DIAGNOSIS — Z853 Personal history of malignant neoplasm of breast: Secondary | ICD-10-CM | POA: Diagnosis not present

## 2014-09-25 MED ORDER — VENLAFAXINE HCL ER 150 MG PO CP24: 150.0000 mg | ORAL_CAPSULE | Freq: Every day | ORAL | Status: DC

## 2014-09-25 MED ORDER — TRAZODONE HCL 100 MG PO TABS: 200.0000 mg | ORAL_TABLET | Freq: Every day | ORAL | Status: DC

## 2014-09-25 MED ORDER — LAMOTRIGINE 150 MG PO TABS: 150.0000 mg | ORAL_TABLET | Freq: Two times a day (BID) | ORAL | Status: DC

## 2014-09-25 NOTE — Progress Notes (Signed)
[facility] Progress Note   Anita Arroyo [id number] 54 y.o.  09/25/2014 2:37 PM  Chief Complaint:  Medication management of followup.  History of Present Illness:  Anita Arroyo came for her followup appointment.  She is complaining of left shoulder pain and taking pain medication.  She told that she may require surgery but she can't afford physical therapy after the surgery.  She endorse financial distress.  She is getting pain medication and once in a while injection in her shoulder joint .  Overall she described her mood good there are times when she cannot sleep due to pain.  She denies any agitation, anger, mania or any crying spells.  She denies any hallucination or any paranoia.  She has no rash or itching.  She denies any self abusive behavior.  Recently she celebrated her mother's 80th birthday and she had a good time.  Patient denies drinking or using any illegal substances.  She has no tremors or any shakes.  Her appetite is okay.  Her vitals are stable.   Patient lives by herself.  She has no children.  She has good relationship with her adopted and her logical mother.    Suicidal Ideation: No Plan Formed: No Patient has means to carry out plan: No  Homicidal Ideation: No Plan Formed: No Patient has means to carry out plan: No  Past Psychiatric History/Hospitalization(s) Patient has bipolar disorder. She had a history of mood swings, anger, impulsive behavior.  She had tried Depakote, Prozac, Paxil, Zoloft and Topamax.  She was seen at [facility] for more than 10 years but she was not happy there.  She denies any history of psychosis, paranoia but endorsed history of severe mania and depression.  Patient denies any history of physical, sexual or verbal abuse.  She was also diagnosed with ADD but a stimulant cause worsening of bipolar disorder.  She had tried Ritalin and Adderall in the past. Anxiety: Yes Bipolar Disorder: Yes Depression: Yes Mania: Yes Psychosis:  No Schizophrenia: No Personality Disorder: No Hospitalization for psychiatric illness: No History of Electroconvulsive Shock Therapy: No Prior Suicide Attempts: No  Medical History; She has multiple medical problems.  She has fibromyalgia, GERD, breast cancer, hyperlipidemia, right knee replacement, shoulder pain and heart murmur.  Her primary care physician is Dr. Nehemiah Settle.  Review of Systems: Psychiatric: Agitation: No Hallucination: No Depressed Mood: No Insomnia: No Hypersomnia: No Altered Concentration: No Feels Worthless: No Grandiose Ideas: No Belief In Special Powers: No New/Increased Substance Abuse: No Compulsions: No  Neurologic: Headache: No Seizure: No Paresthesias: No    Outpatient Encounter Prescriptions as of 09/25/2014  Medication Sig  . ADVAIR DISKUS 250-50 MCG/DOSE AEPB Inhale 1 puff into the lungs 2 (two) times daily.   Marland Kitchen atenolol (TENORMIN) 50 MG tablet Take 1 tablet (50 mg total) by mouth 2 (two) times daily.  . Biotin 5000 MCG CAPS Take 1 capsule by mouth daily.  . cetirizine (ZYRTEC) 10 MG tablet Take 10 mg by mouth daily.   Marland Kitchen HYDROcodone-acetaminophen (NORCO/VICODIN) 5-325 MG per tablet Take 1 tablet by mouth 4 (four) times daily.   Marland Kitchen lamoTRIgine (LAMICTAL) 150 MG tablet Take 1 tablet (150 mg total) by mouth 2 (two) times daily.  . pantoprazole (PROTONIX) 40 MG tablet Take 1 tablet by mouth daily.  . tamoxifen (NOLVADEX) 20 MG tablet TAKE 1 TABLET BY MOUTH DAILY  . traZODone (DESYREL) 100 MG tablet Take 2 tablets (200 mg total) by mouth at bedtime.  . triamterene-hydrochlorothiazide (DYAZIDE)  37.5-25 MG per capsule Take by mouth daily. Pt takes 1/2 tablet daily.  Marland Kitchen venlafaxine XR (EFFEXOR-XR) 150 MG 24 hr capsule Take 1 capsule (150 mg total) by mouth daily.  . [DISCONTINUED] lamoTRIgine (LAMICTAL) 150 MG tablet Take 1 tablet (150 mg total) by mouth 2 (two) times daily.  . [DISCONTINUED] traZODone (DESYREL) 100 MG tablet TAKE 2 TABLETS BY  MOUTH AT BEDTIME  . [DISCONTINUED] venlafaxine XR (EFFEXOR-XR) 150 MG 24 hr capsule Take 1 capsule (150 mg total) by mouth daily.   No facility-administered encounter medications on file as of 09/25/2014.    Recent Results (from the past 2160 hour(s))  CBC with Differential     Status: None   Collection Time: 08/20/14 11:17 AM  Result Value Ref Range   WBC 5.6 3.9 - 10.3 10e3/uL   NEUT# 3.1 1.5 - 6.5 10e3/uL   HGB 13.7 11.6 - 15.9 g/dL   HCT 40.3 34.8 - 46.6 %   Platelets 179 145 - 400 10e3/uL   MCV 94.1 79.5 - 101.0 fL   MCH 32.0 25.1 - 34.0 pg   MCHC 34.0 31.5 - 36.0 g/dL   RBC 4.28 3.70 - 5.45 10e6/uL   RDW 13.2 11.2 - 14.5 %   lymph# 2.0 0.9 - 3.3 10e3/uL   MONO# 0.5 0.1 - 0.9 10e3/uL   Eosinophils Absolute 0.1 0.0 - 0.5 10e3/uL   Basophils Absolute 0.1 0.0 - 0.1 10e3/uL   NEUT% 54.4 38.4 - 76.8 %   LYMPH% 34.7 14.0 - 49.7 %   MONO% 8.7 0.0 - 14.0 %   EOS% 1.2 0.0 - 7.0 %   BASO% 1.0 0.0 - 2.0 %  Comprehensive metabolic panel (Cmet) - [facility]     Status: Abnormal   Collection Time: 08/20/14 11:17 AM  Result Value Ref Range   Sodium 138 136 - 145 mEq/L   Potassium 4.7 3.5 - 5.1 mEq/L   Chloride 104 98 - 109 mEq/L   CO2 26 22 - 29 mEq/L   Glucose 92 70 - 140 mg/dl   BUN 16.0 7.0 - 26.0 mg/dL   Creatinine 0.9 0.6 - 1.1 mg/dL   Total Bilirubin 0.47 0.20 - 1.20 mg/dL   Alkaline Phosphatase 54 40 - 150 U/L   AST 22 5 - 34 U/L   ALT 18 0 - 55 U/L   Total Protein 6.6 6.4 - 8.3 g/dL   Albumin 3.9 3.5 - 5.0 g/dL   Calcium 9.4 8.4 - 10.4 mg/dL   Anion Gap 8 3 - 11 mEq/L   EGFR 76 (L) >90 ml/min/1.73 m2    Comment: eGFR is calculated using the CKD-EPI Creatinine Equation (2831)  FSH-Follicle stimulating hormone     Status: None   Collection Time: 08/20/14 12:44 PM  Result Value Ref Range   FSH 25.2 mIU/mL    Comment: Reference Ranges:         Female:                         1.4 -  18.1 mIU/mL         Female:   Follicular Phase    2.5 -  10.2 mIU/mL                   MidCycle  Peak       3.4 -  33.4 mIU/mL                   Luteal Phase  1.5 -   9.1 mIU/mL                    Post Menopausal    23.0 - 116.3 mIU/mL                   Pregnant                <   0.3 mIU/mL   Estradiol, Ultra Sens     Status: None   Collection Time: 08/20/14 12:45 PM  Result Value Ref Range   Estradiol, Ultra Sensitive 6 pg/mL    Comment:  Adult Female Reference Ranges for Estradiol,  Ultrasensitive, LC/MS/MS:   Follicular Phase:     77-939  pg/mL  Luteal Phase:         48-440  pg/mL  Postmenopausal Phase: < or = 10 pg/mL  Pediatric Female Reference Ranges for Estradiol,  Ultrasensitive,  LC/MS/MS:   Pre-pubertal    (1-9 years):     < or = 16 pg/mL  10-11 years:       < or = 65 pg/mL  12-14 years:       < or = 142 pg/mL  15-17 years:       < or = 283 pg/mL       Physical Exam: Constitutional:  BP 118/68 mmHg  Pulse 72  Ht 5' 2.5" (1.588 m)  Wt 172 lb 6.4 oz (78.2 kg)  BMI 31.01 kg/m2  Musculoskeletal: Strength & Muscle Tone: within normal limits Gait & Station: normal Patient leans: N/A  Mental Status Examination;  Patient is casually dressed and fairly groomed.  She is cooperative and maintained good eye contact.  She describes her good and her affect is improved from the past.  Her attention and concentration is fair.  Her speech is clear and coherent. Her thought process is logical and goal-directed.  She denies any auditory or visual hallucination.  She denies any active or passive suicidal thoughts or homicidal thoughts.  There were no delusions, paranoia or any obsessive thoughts. She is alert and oriented x3.  Her psychomotor activity is normal.  Her fund of knowledge is adequate.  Her insight judgment and impulse control is okay.   Established Problem, Stable/Improving (1), Review of Psycho-Social Stressors (1), Review of Last Therapy Session (1) and Review of Medication Regimen & Side Effects (2)  Assessment: Axis I: Bipolar disorder type I, ADHD by  history  Axis II: Deferred  Axis III:  Past Medical History  Diagnosis Date  . Heart murmur   . ADHD (attention deficit hyperactivity disorder)   . Bipolar 1 disorder   . Arthritis   . Meniere disease   . Neuromuscular disorder     fibromyalgia  . Fibromyalgia   . GERD (gastroesophageal reflux disease)   . Depression   . Bipolar 1 disorder   . MVP (mitral valve prolapse)   . Hyperlipemia   . Breast cancer   . Hx of echocardiogram 04/28/2011    showed normal systolic function with mild diastolic dysfunction,she had trace MR but did not have frank mitral valve prolapse demonstrated. She had mild pulmonary hypertension with an estimated RV systolic pressure at 34 mm.  . History of stress test 04/28/2011    showed normal perfusion without scar or ischemia  . Dysrhythmia     palpitations  . Dysrhythmia     atrial fibrillation    Plan:  Patient is doing better on her current psychotropic medication.  She has no rash or itching.  Continue Effexor 150 mg daily, trazodone 200 mg daily and Lamictal 150 mg twice a day.  She prefers Management consultant at [employer].  She is not interested in counseling .  Recommended to call us back if she has any question or any concern.  I will see her again in 3 months.   ARFEEN,SYED T., MD 09/25/2014

## 2014-09-27 DIAGNOSIS — M75102 Unspecified rotator cuff tear or rupture of left shoulder, not specified as traumatic: Secondary | ICD-10-CM | POA: Diagnosis not present

## 2014-10-03 ENCOUNTER — Other Ambulatory Visit: Payer: Self-pay | Admitting: Obstetrics and Gynecology

## 2014-10-03 ENCOUNTER — Other Ambulatory Visit (HOSPITAL_COMMUNITY)
Admission: RE | Admit: 2014-10-03 | Discharge: 2014-10-03 | Disposition: A | Discharge status: Home / Self Care | Payer: Commercial Managed Care - HMO | Source: Ambulatory Visit | Attending: Obstetrics and Gynecology | Admitting: Obstetrics and Gynecology

## 2014-10-03 DIAGNOSIS — Z01419 Encounter for gynecological examination (general) (routine) without abnormal findings: Secondary | ICD-10-CM | POA: Diagnosis not present

## 2014-10-03 DIAGNOSIS — Z1151 Encounter for screening for human papillomavirus (HPV): Secondary | ICD-10-CM | POA: Diagnosis not present

## 2014-10-03 DIAGNOSIS — R87612 Low grade squamous intraepithelial lesion on cytologic smear of cervix (LGSIL): Secondary | ICD-10-CM | POA: Diagnosis not present

## 2014-10-03 DIAGNOSIS — Z124 Encounter for screening for malignant neoplasm of cervix: Secondary | ICD-10-CM | POA: Diagnosis not present

## 2014-10-03 DIAGNOSIS — Z853 Personal history of malignant neoplasm of breast: Secondary | ICD-10-CM | POA: Diagnosis not present

## 2014-10-07 LAB — CYTOLOGY - PAP

## 2014-10-10 ENCOUNTER — Other Ambulatory Visit: Payer: Self-pay | Admitting: Obstetrics and Gynecology

## 2014-10-10 DIAGNOSIS — Z853 Personal history of malignant neoplasm of breast: Secondary | ICD-10-CM

## 2014-10-10 DIAGNOSIS — Z9889 Other specified postprocedural states: Secondary | ICD-10-CM

## 2014-10-11 DIAGNOSIS — M19012 Primary osteoarthritis, left shoulder: Secondary | ICD-10-CM | POA: Diagnosis not present

## 2014-10-16 ENCOUNTER — Other Ambulatory Visit: Payer: Self-pay | Admitting: Obstetrics and Gynecology

## 2014-10-16 DIAGNOSIS — R87610 Atypical squamous cells of undetermined significance on cytologic smear of cervix (ASC-US): Secondary | ICD-10-CM | POA: Diagnosis not present

## 2014-10-16 DIAGNOSIS — N870 Mild cervical dysplasia: Secondary | ICD-10-CM | POA: Diagnosis not present

## 2014-11-28 ENCOUNTER — Telehealth: Payer: Self-pay | Admitting: Cardiovascular Disease

## 2014-11-28 NOTE — Telephone Encounter (Signed)
Patient is calling to get a prescription for Chantix.

## 2014-11-28 NOTE — Telephone Encounter (Signed)
Will forward to dr Claiborne Billings for okay to prescribe chantix

## 2014-12-01 NOTE — Telephone Encounter (Signed)
Ok to prescribe

## 2014-12-02 MED ORDER — VARENICLINE TARTRATE 0.5 MG PO TABS: 0.5000 mg | ORAL_TABLET | Freq: Two times a day (BID) | ORAL | Status: DC

## 2014-12-02 NOTE — Telephone Encounter (Signed)
Anita Arroyo is calling to get the Prescription for Chantix filled and sent to her pharmacy [facility] on [address] .   Thanks

## 2014-12-02 NOTE — Telephone Encounter (Signed)
Pt was calling back in to see if Dr. Claiborne Billings called in her prescription for Chantix to the [facility] on [address] and [address]. Please f/u with pt  Thanks

## 2014-12-02 NOTE — Telephone Encounter (Signed)
Rx sent to preferred pharmacy.

## 2014-12-13 ENCOUNTER — Other Ambulatory Visit: Payer: Self-pay | Admitting: Cardiovascular Disease

## 2014-12-13 ENCOUNTER — Other Ambulatory Visit (HOSPITAL_COMMUNITY): Payer: Self-pay | Admitting: Psychiatry

## 2014-12-13 DIAGNOSIS — F3162 Bipolar disorder, current episode mixed, moderate: Secondary | ICD-10-CM

## 2014-12-13 NOTE — Telephone Encounter (Signed)
Rx(s) sent to pharmacy electronically.  

## 2014-12-16 ENCOUNTER — Ambulatory Visit (HOSPITAL_COMMUNITY): Payer: Self-pay | Admitting: Psychiatry

## 2014-12-17 DIAGNOSIS — S93492A Sprain of other ligament of left ankle, initial encounter: Secondary | ICD-10-CM | POA: Diagnosis not present

## 2014-12-17 NOTE — Telephone Encounter (Signed)
Met with Dr. Lovena Le who approved a new 90 day order of patient's prescribed Lamictal as patient was rescheduled from 12/16/14 to 01/31/15 due to Dr. Adele Schilder being out of the office that date.  New order e-scribed to patient's [facility] Delivery service as authorized by Dr. Lovena Le covering for Dr. Adele Schilder still out this date.

## 2014-12-24 DIAGNOSIS — M7542 Impingement syndrome of left shoulder: Secondary | ICD-10-CM | POA: Diagnosis not present

## 2014-12-24 DIAGNOSIS — S93492A Sprain of other ligament of left ankle, initial encounter: Secondary | ICD-10-CM | POA: Diagnosis not present

## 2014-12-30 ENCOUNTER — Other Ambulatory Visit (HOSPITAL_COMMUNITY): Payer: Self-pay | Admitting: Psychiatry

## 2015-01-01 ENCOUNTER — Other Ambulatory Visit (HOSPITAL_COMMUNITY): Payer: Self-pay | Admitting: Psychiatry

## 2015-01-01 DIAGNOSIS — F319 Bipolar disorder, unspecified: Secondary | ICD-10-CM

## 2015-01-01 MED ORDER — VENLAFAXINE HCL ER 150 MG PO CP24: 150.0000 mg | ORAL_CAPSULE | Freq: Every day | ORAL | Status: DC

## 2015-01-09 ENCOUNTER — Telehealth: Payer: Self-pay | Admitting: Acute Care

## 2015-01-09 NOTE — Telephone Encounter (Signed)
Left Message to make Appointment x3 for patient. Sent letter.  Dear Anita Arroyo, We have attempted to call you several times to schedule the lung screening Dr. Michail Sermon requested you have performed. We have been unable to contact you by phone. Please call the number below at your earliest convenience so that we can get you scheduled for your screening. We look forward to participating in your care.  Thank you,  [facility] Cancer Screening [facility] [phone]

## 2015-01-10 ENCOUNTER — Other Ambulatory Visit (HOSPITAL_COMMUNITY): Payer: Self-pay | Admitting: Psychiatry

## 2015-01-10 DIAGNOSIS — F3162 Bipolar disorder, current episode mixed, moderate: Secondary | ICD-10-CM

## 2015-01-13 MED ORDER — TRAZODONE HCL 100 MG PO TABS: 200.0000 mg | ORAL_TABLET | Freq: Every day | ORAL | Status: DC

## 2015-01-13 NOTE — Telephone Encounter (Signed)
Met with Dr. Salem Senate who approved a new 90 day order for patient's Trazodone, e-scribed to patient's [facility] this date as patient returns for next evaluation on 01/31/15. Refill of Lamictal denied as was e-scribed to [place] a 90 day order on 12/17/14 by Dr. Adele Schilder.

## 2015-01-14 DIAGNOSIS — M7542 Impingement syndrome of left shoulder: Secondary | ICD-10-CM | POA: Diagnosis not present

## 2015-01-15 ENCOUNTER — Other Ambulatory Visit: Payer: Self-pay | Admitting: Acute Care

## 2015-01-15 DIAGNOSIS — F17210 Nicotine dependence, cigarettes, uncomplicated: Secondary | ICD-10-CM

## 2015-01-21 DIAGNOSIS — F17200 Nicotine dependence, unspecified, uncomplicated: Secondary | ICD-10-CM | POA: Diagnosis not present

## 2015-01-21 DIAGNOSIS — R0600 Dyspnea, unspecified: Secondary | ICD-10-CM | POA: Diagnosis not present

## 2015-01-21 DIAGNOSIS — R062 Wheezing: Secondary | ICD-10-CM | POA: Diagnosis not present

## 2015-01-21 DIAGNOSIS — J069 Acute upper respiratory infection, unspecified: Secondary | ICD-10-CM | POA: Diagnosis not present

## 2015-01-24 ENCOUNTER — Inpatient Hospital Stay: Admission: RE | Admit: 2015-01-24 | Payer: Self-pay | Source: Ambulatory Visit

## 2015-01-24 ENCOUNTER — Encounter: Payer: Self-pay | Admitting: Acute Care

## 2015-01-28 DIAGNOSIS — R062 Wheezing: Secondary | ICD-10-CM | POA: Diagnosis not present

## 2015-01-28 DIAGNOSIS — J209 Acute bronchitis, unspecified: Secondary | ICD-10-CM | POA: Diagnosis not present

## 2015-01-28 DIAGNOSIS — F17200 Nicotine dependence, unspecified, uncomplicated: Secondary | ICD-10-CM | POA: Diagnosis not present

## 2015-01-29 DIAGNOSIS — J310 Chronic rhinitis: Secondary | ICD-10-CM | POA: Diagnosis not present

## 2015-01-29 DIAGNOSIS — J4530 Mild persistent asthma, uncomplicated: Secondary | ICD-10-CM | POA: Diagnosis not present

## 2015-01-29 DIAGNOSIS — M797 Fibromyalgia: Secondary | ICD-10-CM | POA: Diagnosis not present

## 2015-01-31 ENCOUNTER — Ambulatory Visit (HOSPITAL_COMMUNITY): Payer: Self-pay | Admitting: Psychiatry

## 2015-02-07 ENCOUNTER — Ambulatory Visit (INDEPENDENT_AMBULATORY_CARE_PROVIDER_SITE_OTHER): Payer: Medicare HMO | Admitting: Acute Care

## 2015-02-07 ENCOUNTER — Encounter: Payer: Self-pay | Admitting: Acute Care

## 2015-02-07 DIAGNOSIS — F17210 Nicotine dependence, cigarettes, uncomplicated: Secondary | ICD-10-CM

## 2015-02-07 NOTE — Progress Notes (Signed)
Shared Decision Making Visit Lung Cancer Screening Program [phone])   Eligibility:  Age 55 y.o.  Pack Years Smoking History Calculation 70 pack years (# packs/per year x # years smoked)  Recent History of coughing up blood  no  Unexplained weight loss? no ( >Than 15 pounds within the last 6 months )  Prior History Lung / other cancer no (Diagnosis within the last 5 years already requiring surveillance chest CT Scans).  Smoking Status Current Smoker  Former Smokers: Years since quit: NA  Quit Date:NA  Visit Components:  Discussion included one or more decision making aids. yes  Discussion included risk/benefits of screening. yes  Discussion included potential follow up diagnostic testing for abnormal scans. yes  Discussion included meaning and risk of over diagnosis. yes  Discussion included meaning and risk of False Positives. yes  Discussion included meaning of total radiation exposure. yes  Counseling Included:  Importance of adherence to annual lung cancer LDCT screening. yes  Impact of comorbidities on ability to participate in the program. yes  Ability and willingness to under diagnostic treatment. yes  Smoking Cessation Counseling:  Current Smokers:   Discussed importance of smoking cessation. yes  Information about tobacco cessation classes and interventions provided to patient. yes  Patient provided with "ticket" for LDCT Scan. yes  Symptomatic Patient. no  Counseling  Diagnosis Code: Tobacco Use Z72.0  Asymptomatic Patient yes  Counseling (Intermediate counseling: > three minutes counseling) [id number]  Former Smokers:   Discussed the importance of maintaining cigarette abstinence. NA  Diagnosis Code: Personal History of Nicotine Dependence. [id number]  Information about tobacco cessation classes and interventions provided to patient. Yes  Patient provided with "ticket" for LDCT Scan. yes  Written Order for Lung Cancer Screening with LDCT  placed in Epic. Yes (CT Chest Lung Cancer Screening Low Dose W/O CM) [id number] Z12.2-Screening of respiratory organs Z87.891-Personal history of nicotine dependence  I have spent 15 minutes of face to face time with Anita Arroyo discussing the risks and benefits of lung cancer screening. We viewed a power point together that explained in detail the above noted topics. We paused at intervals to allow for questions to be asked and answered to ensure understanding.We discussed that the single most powerful action that she can take to decrease her risk of developing lung cancer is to quit smoking. We discussed whether or not she is ready to commit to setting a quit date. She is ready, has a prescription from her PCP but cannot afford the Chantix. We discussed options for tools to aid in quitting smoking including nicotine replacement therapy, non-nicotine medications, support groups, Quit Smart classes, and behavior modification. We discussed that often times setting smaller, more achievable goals, such as eliminating 1 cigarette a day for a week and then 2 cigarettes a day for a week can be helpful in slowly decreasing the number of cigarettes smoked. This allows for a sense of accomplishment as well as providing a clinical benefit. I gave her the " Be Stronger Than Your Excuses" card with contact information for community resources, classes, free nicotine replacement therapy, and access to mobile apps, text messaging, and on-line smoking cessation help. I have also given her my card and contact information in the event she needs to contact me. We discussed the time and location of the scan, and that either June Leap, CMA, or I will call with the results within 24-48 hours of receiving them. I have provided her with a copy of the power point we viewed  as a resource in the event they need reinforcement of the concepts we discussed today in the office. The patient verbalized understanding of all of  the above and had  no further questions upon leaving the office. They have my contact information in the event they have any further questions.   Magdalen Spatz, NP

## 2015-02-10 ENCOUNTER — Encounter (HOSPITAL_COMMUNITY): Payer: Self-pay | Admitting: Psychiatry

## 2015-02-10 ENCOUNTER — Ambulatory Visit (INDEPENDENT_AMBULATORY_CARE_PROVIDER_SITE_OTHER): Payer: Medicare HMO | Admitting: Psychiatry

## 2015-02-10 VITALS — BP 126/76 | HR 68 | Ht 62.5 in | Wt 176.4 lb

## 2015-02-10 DIAGNOSIS — F319 Bipolar disorder, unspecified: Secondary | ICD-10-CM

## 2015-02-10 DIAGNOSIS — F3162 Bipolar disorder, current episode mixed, moderate: Secondary | ICD-10-CM

## 2015-02-10 MED ORDER — TRAZODONE HCL 100 MG PO TABS
200.0000 mg | ORAL_TABLET | Freq: Every day | ORAL | Status: DC
Start: 2015-02-10 — End: 2015-07-08

## 2015-02-10 MED ORDER — VENLAFAXINE HCL ER 150 MG PO CP24: 150.0000 mg | ORAL_CAPSULE | Freq: Every day | ORAL | Status: DC

## 2015-02-10 MED ORDER — LAMOTRIGINE 150 MG PO TABS: 150.0000 mg | ORAL_TABLET | Freq: Two times a day (BID) | ORAL | Status: DC

## 2015-02-10 NOTE — Progress Notes (Signed)
Anita Arroyo   Anita Arroyo [id number] 55 y.o.  02/10/2015 9:13 AM  Chief Complaint:  Medication management of followup.  History of Present Illness:  Anita Arroyo came for her followup appointment.  She had a good Christmas.  She spent time with her mom .  She endorse that she has to take antibiotic because she was sick and having bronchitis but things are getting better now she is no longer taking antibiotic and cough medicine.  She described her mood is good.  She denies any irritability, anger, mood swing or any mania .  She continues to get monthly injection for her shoulder pain .  She is compliant with Lamictal, trazodone and Effexor.  Her energy level is good.  She denies any feeling of hopelessness or worthlessness.  She lives by herself.  She has no children.  She had a good relationship with her adopted and her biological mother.  Patient denies drinking or using any illegal substances.  Suicidal Ideation: No Plan Formed: No Patient has means to carry out plan: No  Homicidal Ideation: No Plan Formed: No Patient has means to carry out plan: No  Past Psychiatric History/Hospitalization(s) Patient has history of bipolar disorder. She had a history of mood swings, anger, impulsive behavior.  She had tried Depakote, Prozac, Paxil, Zoloft and Topamax.  She was seen at [facility] for more than 10 years but she was not happy there. Patient denies any history of physical, sexual or verbal abuse.  She was also diagnosed with ADD but a stimulant cause worsening of bipolar disorder.  She had tried Ritalin and Adderall in the past. Anxiety: Yes Bipolar Disorder: Yes Depression: Yes Mania: Yes Psychosis: No Schizophrenia: No Personality Disorder: No Hospitalization for psychiatric illness: No History of Electroconvulsive Shock Therapy: No Prior Suicide Attempts: No  Medical History; She has multiple medical problems.  She has fibromyalgia, GERD, breast cancer,  hyperlipidemia, right knee replacement, shoulder pain and heart murmur.  Her primary care physician is Dr. Nehemiah Settle.  Review of Systems: Psychiatric: Agitation: No Hallucination: No Depressed Mood: No Insomnia: No Hypersomnia: No Altered Concentration: No Feels Worthless: No Grandiose Ideas: No Belief In Special Powers: No New/Increased Substance Abuse: No Compulsions: No  Neurologic: Headache: No Seizure: No Paresthesias: No    Outpatient Encounter Prescriptions as of 02/10/2015  Medication Sig  . ADVAIR DISKUS 250-50 MCG/DOSE AEPB Inhale 1 puff into the lungs 2 (two) times daily.   Marland Kitchen atenolol (TENORMIN) 50 MG tablet TAKE 1 TABLET TWICE DAILY  . Biotin 5000 MCG CAPS Take 1 capsule by mouth daily.  . cetirizine (ZYRTEC) 10 MG tablet Take 10 mg by mouth daily.   Marland Kitchen HYDROcodone-acetaminophen (NORCO/VICODIN) 5-325 MG per tablet Take 1 tablet by mouth 4 (four) times daily.   Marland Kitchen lamoTRIgine (LAMICTAL) 150 MG tablet Take 1 tablet (150 mg total) by mouth 2 (two) times daily.  . pantoprazole (PROTONIX) 40 MG tablet Take 1 tablet by mouth daily.  . tamoxifen (NOLVADEX) 20 MG tablet TAKE 1 TABLET BY MOUTH DAILY  . traZODone (DESYREL) 100 MG tablet Take 2 tablets (200 mg total) by mouth at bedtime.  . triamterene-hydrochlorothiazide (DYAZIDE) 37.5-25 MG per capsule Take by mouth daily. Pt takes 1/2 tablet daily.  Marland Kitchen venlafaxine XR (EFFEXOR-XR) 150 MG 24 hr capsule Take 1 capsule (150 mg total) by mouth daily.  . [DISCONTINUED] lamoTRIgine (LAMICTAL) 150 MG tablet TAKE 1 TABLET TWICE DAILY  . [DISCONTINUED] traZODone (DESYREL) 100 MG tablet Take 2 tablets (200  mg total) by mouth at bedtime.  . [DISCONTINUED] varenicline (CHANTIX) 0.5 MG tablet Take 1 tablet (0.5 mg total) by mouth 2 (two) times daily.  . [DISCONTINUED] venlafaxine XR (EFFEXOR-XR) 150 MG 24 hr capsule Take 1 capsule (150 mg total) by mouth daily.   No facility-administered encounter medications on file as of 02/10/2015.     No results found for this or any previous visit (from the past 2160 hour(s)).    Physical Exam: Constitutional:  BP 126/76 mmHg  Pulse 68  Ht 5' 2.5" (1.588 m)  Wt 176 lb 6.4 oz (80.015 kg)  BMI 31.73 kg/m2  Musculoskeletal: Strength & Muscle Tone: within normal limits Gait & Station: normal Patient leans: N/A  Psychiatric Specialty Exam: Physical Exam  Review of Systems  Skin: Negative for itching and rash.  Neurological: Negative for headaches.    Blood pressure 126/76, pulse 68, height 5' 2.5" (1.588 m), weight 176 lb 6.4 oz (80.015 kg).Body mass index is 31.73 kg/(m^2).  General Appearance: Casual  Eye Contact::  Good  Speech:  Normal Rate  Volume:  Normal  Mood:  Anxious  Affect:  Appropriate  Thought Process:  Coherent  Orientation:  Full (Time, Place, and Person)  Thought Content:  WDL  Suicidal Thoughts:  No  Homicidal Thoughts:  No  Memory:  Immediate;   Good Recent;   Good Remote;   Good  Judgement:  Good  Insight:  Good  Psychomotor Activity:  Normal  Concentration:  Good  Recall:  Good  Fund of Knowledge:  Good  Language:  Good  Akathisia:  No  Handed:  Right  AIMS (if indicated):     Assets:  Communication Skills Desire for Improvement Housing Physical Health Social Support Transportation  ADL's:  Intact  Cognition:  WNL  Sleep:       Established Problem, Stable/Improving (1), Review of Psycho-Social Stressors (1), Review of Last Therapy Session (1) and Review of Medication Regimen & Side Effects (2)  Assessment: Axis I: Bipolar disorder type I, ADHD by history  Axis II: Deferred  Axis III:  Past Medical History  Diagnosis Date  . Heart murmur   . ADHD (attention deficit hyperactivity disorder)   . Bipolar 1 disorder ([place])   . Arthritis   . Meniere disease   . Neuromuscular disorder (HCC)     fibromyalgia  . Fibromyalgia   . GERD (gastroesophageal reflux disease)   . Depression   . Bipolar 1 disorder ([place])   . MVP  (mitral valve prolapse)   . Hyperlipemia   . Breast cancer ([place])   . Hx of echocardiogram 04/28/2011    showed normal systolic function with mild diastolic dysfunction,she had trace MR but did not have frank mitral valve prolapse demonstrated. She had mild pulmonary hypertension with an estimated RV systolic pressure at 34 mm.  . History of stress test 04/28/2011    showed normal perfusion without scar or ischemia  . Dysrhythmia     palpitations  . Dysrhythmia     atrial fibrillation    Plan:  Patient is doing better on her current psychotropic medication.  She has no rash or itching.  Continue Effexor 150 mg daily, trazodone 200 mg daily and Lamictal 150 mg twice a day.  She is not interested in counseling .  Recommended to call us back if she has any question or any concern.  I will see her again in 3 months.   Talya Quain T., MD 02/10/2015

## 2015-02-14 ENCOUNTER — Ambulatory Visit
Admission: RE | Admit: 2015-02-14 | Discharge: 2015-02-14 | Disposition: A | Discharge status: Home / Self Care | Payer: Medicare HMO | Source: Ambulatory Visit | Attending: Obstetrics and Gynecology | Admitting: Obstetrics and Gynecology

## 2015-02-14 DIAGNOSIS — Z9889 Other specified postprocedural states: Secondary | ICD-10-CM

## 2015-02-14 DIAGNOSIS — Z853 Personal history of malignant neoplasm of breast: Secondary | ICD-10-CM

## 2015-02-14 DIAGNOSIS — R922 Inconclusive mammogram: Secondary | ICD-10-CM | POA: Diagnosis not present

## 2015-02-17 ENCOUNTER — Telehealth: Payer: Self-pay | Admitting: Acute Care

## 2015-02-17 NOTE — Telephone Encounter (Signed)
Called to let patient know her scan was approved by her insurance and to get her scheduled for her scan. There was no answer. I left a message asking her to call the office to get her scan scheduled.

## 2015-02-27 ENCOUNTER — Inpatient Hospital Stay: Admission: RE | Admit: 2015-02-27 | Payer: Self-pay | Source: Ambulatory Visit

## 2015-03-03 ENCOUNTER — Inpatient Hospital Stay: Admission: RE | Admit: 2015-03-03 | Payer: Self-pay | Source: Ambulatory Visit

## 2015-03-11 ENCOUNTER — Ambulatory Visit (INDEPENDENT_AMBULATORY_CARE_PROVIDER_SITE_OTHER)
Admission: RE | Admit: 2015-03-11 | Discharge: 2015-03-11 | Disposition: A | Discharge status: Home / Self Care | Payer: Medicare HMO | Source: Ambulatory Visit | Attending: Acute Care | Admitting: Acute Care

## 2015-03-11 DIAGNOSIS — Z87891 Personal history of nicotine dependence: Secondary | ICD-10-CM | POA: Diagnosis not present

## 2015-03-11 DIAGNOSIS — F17210 Nicotine dependence, cigarettes, uncomplicated: Secondary | ICD-10-CM

## 2015-04-01 ENCOUNTER — Other Ambulatory Visit: Payer: Self-pay | Admitting: Acute Care

## 2015-04-01 DIAGNOSIS — F17210 Nicotine dependence, cigarettes, uncomplicated: Secondary | ICD-10-CM

## 2015-04-15 DIAGNOSIS — M25561 Pain in right knee: Secondary | ICD-10-CM | POA: Diagnosis not present

## 2015-04-15 DIAGNOSIS — M7542 Impingement syndrome of left shoulder: Secondary | ICD-10-CM | POA: Diagnosis not present

## 2015-05-01 DIAGNOSIS — H04123 Dry eye syndrome of bilateral lacrimal glands: Secondary | ICD-10-CM | POA: Diagnosis not present

## 2015-05-01 DIAGNOSIS — G43809 Other migraine, not intractable, without status migrainosus: Secondary | ICD-10-CM | POA: Diagnosis not present

## 2015-05-01 DIAGNOSIS — Z961 Presence of intraocular lens: Secondary | ICD-10-CM | POA: Diagnosis not present

## 2015-05-01 DIAGNOSIS — H43393 Other vitreous opacities, bilateral: Secondary | ICD-10-CM | POA: Diagnosis not present

## 2015-05-02 ENCOUNTER — Other Ambulatory Visit: Payer: Self-pay

## 2015-05-02 ENCOUNTER — Telehealth: Payer: Self-pay | Admitting: *Deleted

## 2015-05-02 MED ORDER — VARENICLINE TARTRATE 1 MG PO TABS: 1.0000 mg | ORAL_TABLET | Freq: Two times a day (BID) | ORAL | Status: DC

## 2015-05-02 NOTE — Telephone Encounter (Signed)
Spoke to patient she states she needed prescription for CHANTIX continuing dose 1 mg twice a day  She did not start medication-in Nov 2016 Informed patient prescription has been sent , for a 3 month refill. Patient verbalized understanding.

## 2015-05-12 ENCOUNTER — Ambulatory Visit (HOSPITAL_COMMUNITY): Payer: Self-pay | Admitting: Psychiatry

## 2015-05-22 ENCOUNTER — Ambulatory Visit (HOSPITAL_COMMUNITY): Payer: Self-pay | Admitting: Psychiatry

## 2015-05-29 ENCOUNTER — Ambulatory Visit (HOSPITAL_COMMUNITY): Payer: Self-pay | Admitting: Psychiatry

## 2015-06-20 DIAGNOSIS — M7542 Impingement syndrome of left shoulder: Secondary | ICD-10-CM | POA: Diagnosis not present

## 2015-06-20 DIAGNOSIS — M25511 Pain in right shoulder: Secondary | ICD-10-CM | POA: Diagnosis not present

## 2015-06-30 ENCOUNTER — Other Ambulatory Visit (HOSPITAL_COMMUNITY): Payer: Self-pay | Admitting: Psychiatry

## 2015-07-04 ENCOUNTER — Telehealth (HOSPITAL_COMMUNITY): Payer: Self-pay

## 2015-07-04 ENCOUNTER — Other Ambulatory Visit (HOSPITAL_COMMUNITY): Payer: Self-pay | Admitting: Psychiatry

## 2015-07-04 DIAGNOSIS — F319 Bipolar disorder, unspecified: Secondary | ICD-10-CM

## 2015-07-04 MED ORDER — VENLAFAXINE HCL ER 150 MG PO CP24: 150.0000 mg | ORAL_CAPSULE | Freq: Every day | ORAL | Status: DC

## 2015-07-04 NOTE — Telephone Encounter (Signed)
Patient called, she is out of her Effexor as of today. Patient has a follow up on 6/13 - I sent in an order to her pharmacy for one month.

## 2015-07-07 ENCOUNTER — Other Ambulatory Visit (HOSPITAL_COMMUNITY): Payer: Self-pay | Admitting: Psychiatry

## 2015-07-08 ENCOUNTER — Encounter (HOSPITAL_COMMUNITY): Payer: Self-pay | Admitting: Psychiatry

## 2015-07-08 ENCOUNTER — Ambulatory Visit (INDEPENDENT_AMBULATORY_CARE_PROVIDER_SITE_OTHER): Payer: Medicare HMO | Admitting: Psychiatry

## 2015-07-08 DIAGNOSIS — F909 Attention-deficit hyperactivity disorder, unspecified type: Secondary | ICD-10-CM

## 2015-07-08 DIAGNOSIS — F3162 Bipolar disorder, current episode mixed, moderate: Secondary | ICD-10-CM

## 2015-07-08 DIAGNOSIS — F319 Bipolar disorder, unspecified: Secondary | ICD-10-CM | POA: Diagnosis not present

## 2015-07-08 MED ORDER — VENLAFAXINE HCL ER 150 MG PO CP24: 150.0000 mg | ORAL_CAPSULE | Freq: Every day | ORAL | Status: DC

## 2015-07-08 MED ORDER — LAMOTRIGINE 150 MG PO TABS: 150.0000 mg | ORAL_TABLET | Freq: Two times a day (BID) | ORAL | Status: DC

## 2015-07-08 MED ORDER — TRAZODONE HCL 100 MG PO TABS: 200.0000 mg | ORAL_TABLET | Freq: Every day | ORAL | Status: DC

## 2015-07-08 NOTE — Progress Notes (Signed)
[facility] Progress Note   Anita Arroyo [id number] 55 y.o.  07/08/2015 1:48 PM  Chief Complaint:  Medication management of followup.  History of Present Illness:  Anita Arroyo came for her followup appointment.  She missed her last appointment.  However she is taking the medication as prescribed.  She described her mood is stable.  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  She was very anxious but did handle the situation well.  She also took Chantix for 2 months but it makes her very depressed, moody having bad dreams and then she decided to stop the medicine after 2 months.  She is hoping to get Nicorette patch or gum.  She admitted relapsed into smoking she is scheduled to see her primary care physician on July 11 of physical and blood work.  Patient denies any agitation, anger, mood swing.  She denies any crying spells.  Her appetite is okay.  She has no rash or itching.  She likes her medication and reported no side effects.  She lives by herself.  Her appetite is okay. She has no children.  She had a good relationship with her adopted and her biological mother.  Patient denies drinking or using any illegal substances.  Suicidal Ideation: No Plan Formed: No Patient has means to carry out plan: No  Homicidal Ideation: No Plan Formed: No Patient has means to carry out plan: No  Past Psychiatric History/Hospitalization(s) Patient has history of bipolar disorder. She had a history of mood swings, anger, impulsive behavior.  She had tried Depakote, Prozac, Paxil, Zoloft and Topamax.  She was seen at [facility] for more than 10 years but she was not happy there. Patient denies any history of physical, sexual or verbal abuse.  She was also diagnosed with ADD but a stimulant cause worsening of bipolar disorder.  She had tried Ritalin and Adderall in the past. Anxiety:  Yes Bipolar Disorder: Yes Depression: Yes Mania: Yes Psychosis: No Schizophrenia: No Personality Disorder: No Hospitalization for psychiatric illness: No History of Electroconvulsive Shock Therapy: No Prior Suicide Attempts: No  Medical History; She has multiple medical problems.  She has fibromyalgia, GERD, breast cancer, hyperlipidemia, right knee replacement, shoulder pain and heart murmur.  Her primary care physician is [place].    Review of Systems: Psychiatric: Agitation: No Hallucination: No Depressed Mood: No Insomnia: No Hypersomnia: No Altered Concentration: No Feels Worthless: No Grandiose Ideas: No Belief In Special Powers: No New/Increased Substance Abuse: No Compulsions: No  Neurologic: Headache: No Seizure: No Paresthesias: No    Outpatient Encounter Prescriptions as of 07/08/2015  Medication Sig  . ADVAIR DISKUS 250-50 MCG/DOSE AEPB Inhale 1 puff into the lungs 2 (two) times daily.   Marland Kitchen atenolol (TENORMIN) 50 MG tablet TAKE 1 TABLET TWICE DAILY  . Biotin 5000 MCG CAPS Take 1 capsule by mouth daily.  . cetirizine (ZYRTEC) 10 MG tablet Take 10 mg by mouth daily.   Marland Kitchen HYDROcodone-acetaminophen (NORCO/VICODIN) 5-325 MG per tablet Take 1 tablet by mouth 4 (four) times daily.   Marland Kitchen lamoTRIgine (LAMICTAL) 150 MG tablet Take 1 tablet (150 mg total) by mouth 2 (two) times daily.  . pantoprazole (PROTONIX) 40 MG tablet Take 1 tablet by mouth daily.  . tamoxifen (NOLVADEX) 20 MG tablet TAKE 1 TABLET BY MOUTH DAILY  . traZODone (DESYREL) 100 MG tablet Take  2 tablets (200 mg total) by mouth at bedtime.  . triamterene-hydrochlorothiazide (DYAZIDE) 37.5-25 MG per capsule Take by mouth daily. Pt takes 1/2 tablet daily.  Marland Kitchen venlafaxine XR (EFFEXOR-XR) 150 MG 24 hr capsule Take 1 capsule (150 mg total) by mouth daily.  . [DISCONTINUED] lamoTRIgine (LAMICTAL) 150 MG tablet Take 1 tablet (150 mg total) by mouth 2 (two) times daily.  . [DISCONTINUED] traZODone (DESYREL)  100 MG tablet Take 2 tablets (200 mg total) by mouth at bedtime.  . [DISCONTINUED] varenicline (CHANTIX) 1 MG tablet Take 1 tablet (1 mg total) by mouth 2 (two) times daily.  . [DISCONTINUED] venlafaxine XR (EFFEXOR-XR) 150 MG 24 hr capsule Take 1 capsule (150 mg total) by mouth daily.   No facility-administered encounter medications on file as of 07/08/2015.    No results found for this or any previous visit (from the past 2160 hour(s)).    Physical Exam: Constitutional:  There were no vitals taken for this visit.  Musculoskeletal: Strength & Muscle Tone: within normal limits Gait & Station: normal Patient leans: N/A  Psychiatric Specialty Exam: Physical Exam  Review of Systems  Skin: Negative for itching and rash.  Neurological: Negative for headaches.    There were no vitals taken for this visit.There is no weight on file to calculate BMI.  General Appearance: Casual  Eye Contact::  Good  Speech:  Normal Rate  Volume:  Normal  Mood:  Anxious  Affect:  Appropriate  Thought Process:  Coherent  Orientation:  Full (Time, Place, and Person)  Thought Content:  WDL  Suicidal Thoughts:  No  Homicidal Thoughts:  No  Memory:  Immediate;   Good Recent;   Good Remote;   Good  Judgement:  Good  Insight:  Good  Psychomotor Activity:  Normal  Concentration:  Good  Recall:  Good  Fund of Knowledge:  Good  Language:  Good  Akathisia:  No  Handed:  Right  AIMS (if indicated):     Assets:  Communication Skills Desire for Improvement Housing Physical Health Social Support Transportation  ADL's:  Intact  Cognition:  WNL  Sleep:       Established Problem, Stable/Improving (1), Review of Psycho-Social Stressors (1), Review of Last Therapy Session (1) and Review of Medication Regimen & Side Effects (2)  Assessment: Axis I: Bipolar disorder type I, ADHD by history  Axis II: Deferred  Axis III:  Past Medical History  Diagnosis Date  . Heart murmur   . ADHD (attention  deficit hyperactivity disorder)   . Bipolar 1 disorder ([place])   . Arthritis   . Meniere disease   . Neuromuscular disorder (HCC)     fibromyalgia  . Fibromyalgia   . GERD (gastroesophageal reflux disease)   . Depression   . Bipolar 1 disorder ([place])   . MVP (mitral valve prolapse)   . Hyperlipemia   . Breast cancer ([place])   . Hx of echocardiogram 04/28/2011    showed normal systolic function with mild diastolic dysfunction,she had trace MR but did not have frank mitral valve prolapse demonstrated. She had mild pulmonary hypertension with an estimated RV systolic pressure at 34 mm.  . History of stress test 04/28/2011    showed normal perfusion without scar or ischemia  . Dysrhythmia     palpitations  . Dysrhythmia     atrial fibrillation    Plan:  Patient is doing better on her current psychotropic medication.  She had physical and blood work on July 11.  I  recommended to have her blood work results faxed to [place].  She has no rash or itching.  Continue Effexor 150 mg daily, trazodone 200 mg daily and Lamictal 150 mg twice a day.  She is not interested in counseling .  Recommended to call us back if she has any question or any concern.  I will see her again in 3 months.   Kobie Whidby T., MD 07/08/2015

## 2015-07-14 ENCOUNTER — Other Ambulatory Visit (HOSPITAL_COMMUNITY): Payer: Self-pay | Admitting: Psychiatry

## 2015-07-16 ENCOUNTER — Ambulatory Visit (INDEPENDENT_AMBULATORY_CARE_PROVIDER_SITE_OTHER): Payer: Commercial Managed Care - HMO | Admitting: Podiatry

## 2015-07-16 ENCOUNTER — Encounter: Payer: Self-pay | Admitting: Podiatry

## 2015-07-16 VITALS — BP 111/65 | HR 66 | Resp 12

## 2015-07-16 DIAGNOSIS — L600 Ingrowing nail: Secondary | ICD-10-CM | POA: Diagnosis not present

## 2015-07-16 NOTE — Patient Instructions (Signed)
ANTIBACTERIAL SOAP INSTRUCTIONS  THE DAY AFTER PROCEDURE   For the first dressing change (soak) Place 3-4 drops of antibacterial liquid soapor dishwashing soap in a quart of warm tap water.  Submerge foot into water for 1- 2 minutes.  If bandage was applied after your procedure, leave on to allow for easy lift off, then remove and continue with soak for the remaining time.  Next, blot area dry with a soft cloth and apply antibiotic ointment such as polysporin, neosporin, or triple antibiotic ointment.  You may then switch to the instructions below    Shower as usual. Before getting out, place a drop of antibacterial liquid soap (Dial) on a wet, clean washcloth.  Gently wipe washcloth over affected area.  Afterward, rinse the area with warm water.  Blot the area dry with a soft cloth and cover with antibiotic ointment (neosporin, polysporin, bacitracin) and band aid or gauze and tape

## 2015-07-16 NOTE — Progress Notes (Signed)
   Subjective:    Patient ID: Anita Arroyo, female    DOB: 10/06/1960, 55 y.o.   MRN: [id number]  HPI   This patient presents today complaining of a 1 year history of ongoing discomfort in the fourth right toenail area aggravated with shoe wearing and walking and direct pressure. Patient wears soft shoes and applies ice to the toenail area.she would like to have the toenail removed for permanent correction. Patient has a history of multiple permanen toenail removals on the right and left feet for similar problems.  Review of Systems  Constitutional: Positive for fatigue.  HENT: Positive for hearing loss.   Eyes: Positive for itching.  Respiratory: Positive for shortness of breath and wheezing.   Musculoskeletal: Positive for joint swelling.  Allergic/Immunologic: Positive for environmental allergies.  Neurological: Positive for headaches.       Objective:   Physical Exam  rientated 3  Vascular: No peripheral edema bilaterally DP and PT pulses 2/4 bilaterally Capillary reflex immediate bilaterally  Neurological: Sensation to 10 g monofilament wire intact 5/5 bilaterally Vibratory intact bilaterally Ankle reflex equal reactive bilaterally  Dermatological: No open skin lesions bilaterally Toes 4 and 5 are discolored from iceapplication and after warming the capillary refill is immediate Absent toe nails1, 2, 5 right and 1, 2, 3 left The fourth right toenail is incurvated and deformed and tender to direct palpation  small nucleated keratoses fifth left MPJ  Musculoskeletal: No restriction ankle, subtalar, midtarsal joints bilaterally       Assessment & Plan:   Assessment: Satisfactory neurovascular status Ingrowing deformed fourth toenail right foot  Plan: Today we discussed treatment options including repetitive debridement, versus permanent toenail removal. Patient is requesting permanent toenail removal The fourth right toe was blocked with 2 mL of 50-50 mixture  2% plain Xylocaine and 0.5% plain Sensorcaine. Toe is prepped with Betadine and exsanguinated. The fourth right toenail was avulsed and a phenol matricectomy performed. The nailbed was flushed with alcohol and antibiotic compression dressing applied.the tourniquet was released and spontaneous capillary fill time noted to the fourth right toe. Patient tolerated procedure with without any difficulty. Postoperative oral and written instructions provided.  Reappoint at patient's request

## 2015-08-05 ENCOUNTER — Other Ambulatory Visit: Payer: Self-pay | Admitting: Orthopedic Surgery

## 2015-08-05 DIAGNOSIS — M25511 Pain in right shoulder: Secondary | ICD-10-CM

## 2015-08-07 ENCOUNTER — Telehealth: Payer: Self-pay | Admitting: *Deleted

## 2015-08-07 NOTE — Telephone Encounter (Signed)
Requesting surgical clearance: [facility]  1. Type of surgery: Right shoulder scope and rotator cuff repair  2. Surgeon: Kathryne Hitch  3. Surgical date: 10/16/15  4. Medications that need to be held: [place]

## 2015-08-13 ENCOUNTER — Ambulatory Visit
Admission: RE | Admit: 2015-08-13 | Discharge: 2015-08-13 | Disposition: A | Discharge status: Home / Self Care | Payer: Commercial Managed Care - HMO | Source: Ambulatory Visit | Attending: Orthopedic Surgery | Admitting: Orthopedic Surgery

## 2015-08-13 ENCOUNTER — Telehealth: Payer: Self-pay | Admitting: Cardiovascular Disease

## 2015-08-13 DIAGNOSIS — M25511 Pain in right shoulder: Secondary | ICD-10-CM

## 2015-08-13 NOTE — Telephone Encounter (Signed)
Left voicemail for the patient to call back and schedule a visit with an extender to get surgical clearance for upcoming surgery on 10-16-15.

## 2015-08-25 ENCOUNTER — Other Ambulatory Visit: Payer: Self-pay | Admitting: Cardiovascular Disease

## 2015-08-29 ENCOUNTER — Other Ambulatory Visit: Payer: Self-pay | Admitting: *Deleted

## 2015-08-29 MED ORDER — ATENOLOL 50 MG PO TABS: 50.0000 mg | ORAL_TABLET | Freq: Two times a day (BID) | ORAL | 0 refills | Status: DC

## 2015-09-15 ENCOUNTER — Telehealth: Payer: Self-pay | Admitting: Cardiology

## 2015-09-15 NOTE — H&P (Signed)
This is a 55 year-old female who presents to our clinic today with the return of pain to both shoulders.  In regards to her left shoulder, she is status post arthroscopic debridement and decompression in 2013.  It was noted during that time she had diffuse Grade III changes in the glenohumeral joint.  We saw her back at the end of May where we proceeded with an intraarticular Cortisone injection.  This helped a great amount, but only gave her about a month's relief.  She comes in to discuss definitive treatment of a left total shoulder replacement.  The other issue she brings up is her right shoulder.  History of chronic impingement syndrome here.  No previous surgical intervention in the past.  We saw her back at the end of May of this past year where we injected her subacromially.  This helped quite a bit, but only lasted for one month.   Past medical, social and family history reviewed in detail on the patient questionnaire and signed.  Review of systems: As detailed in HPI.  All others reviewed and are negative.    EXAMINATION: Well-developed, well-nourished female in no acute distress.  Alert and oriented x 3.  Examination of both shoulders reveals full active range of motion in all planes.  Positive empty can.  4/5 strength throughout with external rotation.     IMPRESSION: 1. 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  2. Right shoulder chronic impingement syndrome.   PLAN: In regards to [facility] right shoulder, we are going to get an MRI to assess her rotator cuff.  We are going to go ahead and fill out paperwork for a right shoulder arthroscopic decompression and possible rotator cuff repair.  Paperwork complete.  Risks, benefits and possible complications of surgery have been reviewed.  Rehab and recovery time discussed.  All questions answered.  We will be in touch with [facility] after her MRI is completed.  In regards to her left shoulder, definitive treatment here is a  total shoulder replacement.  We are going to go ahead and fill out paperwork to proceed with this 12 weeks post-op from the right shoulder.  The risks, benefits and possible complications of surgery have been reviewed.  Rehab and recovery time discussed. All questions answered.  Paperwork complete.  In the meantime we are going to inject Leyli's left shoulder with 1:4 Depo-Medrol/Marcaine into the intraarticular space.  We are also injecting her right shoulder subacromial space with 1:4 Depo-Medrol/Marcaine.  We are hopeful this will give her some relief until operative intervention.

## 2015-09-16 ENCOUNTER — Encounter: Payer: Self-pay | Admitting: Cardiology

## 2015-09-16 ENCOUNTER — Ambulatory Visit (INDEPENDENT_AMBULATORY_CARE_PROVIDER_SITE_OTHER): Payer: Commercial Managed Care - HMO | Admitting: Cardiology

## 2015-09-16 ENCOUNTER — Ambulatory Visit: Payer: Self-pay | Admitting: Cardiology

## 2015-09-16 ENCOUNTER — Ambulatory Visit: Payer: Commercial Managed Care - HMO | Admitting: Cardiology

## 2015-09-16 VITALS — BP 115/68 | HR 76 | Ht 62.5 in | Wt 188.2 lb

## 2015-09-16 DIAGNOSIS — M797 Fibromyalgia: Secondary | ICD-10-CM

## 2015-09-16 DIAGNOSIS — Z853 Personal history of malignant neoplasm of breast: Secondary | ICD-10-CM

## 2015-09-16 DIAGNOSIS — Z8679 Personal history of other diseases of the circulatory system: Secondary | ICD-10-CM

## 2015-09-16 DIAGNOSIS — I10 Essential (primary) hypertension: Secondary | ICD-10-CM | POA: Diagnosis not present

## 2015-09-16 DIAGNOSIS — Z01810 Encounter for preprocedural cardiovascular examination: Secondary | ICD-10-CM

## 2015-09-16 DIAGNOSIS — F319 Bipolar disorder, unspecified: Secondary | ICD-10-CM

## 2015-09-16 DIAGNOSIS — Z87898 Personal history of other specified conditions: Secondary | ICD-10-CM

## 2015-09-16 MED ORDER — ATENOLOL 50 MG PO TABS: 50.0000 mg | ORAL_TABLET | Freq: Two times a day (BID) | ORAL | 3 refills | Status: DC

## 2015-09-16 NOTE — Assessment & Plan Note (Signed)
Stable, no change in occasional palpitations

## 2015-09-16 NOTE — Progress Notes (Signed)
09/16/2015 Anita Arroyo   Jun 05, 1960  [id number]  Primary Physician Anita Roup, MD Primary Cardiologist: Dr Anita Arroyo  HPI:  55 y/o overweight female followed by Dr Anita Arroyo with a history of palpitations. She had a low risk Myoview and normal echo in 2013. She has been stable from a cardiac standpoint, no history of chest pain or unusual dyspnea. She has occasional palpitations but no real change in these. She needs Rt shoulder surgery Aug 31st and Lt shoulder surgery in Dec.    Current Outpatient Prescriptions  Medication Sig Dispense Refill  . ADVAIR DISKUS 250-50 MCG/DOSE AEPB Inhale 1 puff into the lungs 2 (two) times daily.     Marland Kitchen atenolol (TENORMIN) 50 MG tablet Take 1 tablet (50 mg total) by mouth 2 (two) times daily. 180 tablet 3  . cetirizine (ZYRTEC) 10 MG tablet Take 10 mg by mouth daily.     Marland Kitchen HYDROcodone-acetaminophen (NORCO/VICODIN) 5-325 MG per tablet Take 1 tablet by mouth 4 (four) times daily.     Marland Kitchen lamoTRIgine (LAMICTAL) 150 MG tablet Take 1 tablet (150 mg total) by mouth 2 (two) times daily. 180 tablet 0  . pantoprazole (PROTONIX) 40 MG tablet Take 1 tablet by mouth daily.    . traZODone (DESYREL) 100 MG tablet Take 2 tablets (200 mg total) by mouth at bedtime. 180 tablet 0  . triamterene-hydrochlorothiazide (DYAZIDE) 37.5-25 MG per capsule Take 0.5 capsules by mouth daily. Pt takes 1/2 tablet daily.    Marland Kitchen venlafaxine XR (EFFEXOR-XR) 150 MG 24 hr capsule Take 1 capsule (150 mg total) by mouth daily. 90 capsule 0   No current facility-administered medications for this visit.     Allergies  Allergen Reactions  . Contrast Media [Iodinated Diagnostic Agents] Shortness Of Breath  . Iodine Shortness Of Breath  . Adhesive [Tape]     blister  . Amoxicillin-Pot Clavulanate Rash  . Ceclor [Cefaclor] Rash  . Latex     nausea  . Moxifloxacin Rash  . Sulfonamide Derivatives Nausea And Vomiting    Social History   Social History  . Marital status: Single    Spouse  name: N/A  . Number of children: N/A  . Years of education: N/A   Occupational History  . Not on file.   Social History Main Topics  . Smoking status: Current Every Day Smoker    Packs/day: 1.75    Years: 40.00    Types: Cigarettes  . Smokeless tobacco: Never Used     Comment: Cannot afford chantix. Working on getting discount from drug compny  . Alcohol use 0.0 oz/week     Comment: occ  . Drug use: No  . Sexual activity: Not on file   Other Topics Concern  . Not on file   Social History Narrative  . No narrative on file     Review of Systems: General: negative for chills, fever, night sweats or weight changes.  Cardiovascular: negative for chest pain, dyspnea on exertion, edema, orthopnea, palpitations, paroxysmal nocturnal dyspnea or shortness of breath Dermatological: negative for rash Respiratory: negative for cough or wheezing Urologic: negative for hematuria Abdominal: negative for nausea, vomiting, diarrhea, bright red blood per rectum, melena, or hematemesis Neurologic: negative for visual changes, syncope, or dizziness All other systems reviewed and are otherwise negative except as noted above.    Blood pressure 115/68, pulse 76, height 5' 2.5" (1.588 m), weight 188 lb 3.2 oz (85.4 kg).  General appearance: alert, cooperative, no distress and moderately obese Neck:  no carotid bruit and no JVD Lungs: clear to auscultation bilaterally Heart: regular rate and rhythm Abdomen: obese Extremities: extremities normal, atraumatic, no cyanosis or edema Skin: Skin color, texture, turgor normal. No rashes or lesions Neurologic: Grossly normal  EKG NSR  ASSESSMENT AND PLAN:   History of palpitations Stable, no change in occasional palpitations  Essential hypertension Controlled  Fibromyalgia Followed by PCP  History of breast cancer S/p Lumpectomy 2011  Pre-operative cardiovascular examination Pt seen today for pre op shoulder surgery clearance.    Bipolar 1 disorder ([place]) Stable   PLAN  [name] has no history of chest ;pain. She had a negative cardiac function study in 2013. From a cardiac standpoint she is an acceptable risk for proposed surgery without further testing. We will be available peri op as needed.   Anita Ransom PA-C 09/16/2015 4:23 PM

## 2015-09-16 NOTE — Assessment & Plan Note (Signed)
Stable

## 2015-09-16 NOTE — Assessment & Plan Note (Signed)
Controlled.  

## 2015-09-16 NOTE — Patient Instructions (Signed)
Your physician wants you to follow-up in: 6 months with Dr. Claiborne Billings. You will receive a reminder letter in the mail two months in advance. If you don't receive a letter, please call our office to schedule the follow-up appointment.  If you need a refill on your cardiac medications before your next appointment, please call your pharmacy.   *You have been cleared for surgery from a cardiac standpoint*

## 2015-09-16 NOTE — Assessment & Plan Note (Signed)
S/p Lumpectomy 2011

## 2015-09-16 NOTE — Assessment & Plan Note (Signed)
Pt seen today for pre op shoulder surgery clearance.

## 2015-09-16 NOTE — Assessment & Plan Note (Signed)
Followed by PCP

## 2015-09-17 ENCOUNTER — Encounter (HOSPITAL_BASED_OUTPATIENT_CLINIC_OR_DEPARTMENT_OTHER): Payer: Self-pay | Admitting: *Deleted

## 2015-09-17 NOTE — Telephone Encounter (Signed)
Ok for surgery

## 2015-09-19 ENCOUNTER — Encounter (HOSPITAL_BASED_OUTPATIENT_CLINIC_OR_DEPARTMENT_OTHER)
Admission: RE | Admit: 2015-09-19 | Discharge: 2015-09-19 | Disposition: A | Discharge status: Home / Self Care | Payer: Commercial Managed Care - HMO | Source: Ambulatory Visit | Attending: Orthopedic Surgery | Admitting: Orthopedic Surgery

## 2015-09-19 DIAGNOSIS — Z01818 Encounter for other preprocedural examination: Secondary | ICD-10-CM | POA: Diagnosis not present

## 2015-09-19 LAB — BASIC METABOLIC PANEL
ANION GAP: 9 (ref 5–15)
BUN: 9 mg/dL (ref 6–20)
CALCIUM: 9.8 mg/dL (ref 8.9–10.3)
CHLORIDE: 101 mmol/L (ref 101–111)
CO2: 27 mmol/L (ref 22–32)
CREATININE: 0.85 mg/dL (ref 0.44–1.00)
GFR calc non Af Amer: >60 mL/min (ref >60)
Glucose, Bld: 87 mg/dL (ref 65–99)
Potassium: 4.2 mmol/L (ref 3.5–5.1)
SODIUM: 137 mmol/L (ref 135–145)

## 2015-09-25 ENCOUNTER — Ambulatory Visit (HOSPITAL_BASED_OUTPATIENT_CLINIC_OR_DEPARTMENT_OTHER)
Admission: RE | Admit: 2015-09-25 | Discharge: 2015-09-25 | Disposition: A | Discharge status: Home / Self Care | Payer: Commercial Managed Care - HMO | Source: Ambulatory Visit | Attending: Orthopedic Surgery | Admitting: Orthopedic Surgery

## 2015-09-25 ENCOUNTER — Encounter (HOSPITAL_BASED_OUTPATIENT_CLINIC_OR_DEPARTMENT_OTHER)
Admission: RE | Disposition: A | Discharge status: Home / Self Care | Payer: Self-pay | Source: Ambulatory Visit | Attending: Orthopedic Surgery

## 2015-09-25 ENCOUNTER — Ambulatory Visit (HOSPITAL_BASED_OUTPATIENT_CLINIC_OR_DEPARTMENT_OTHER): Payer: Commercial Managed Care - HMO | Admitting: Anesthesiology

## 2015-09-25 ENCOUNTER — Encounter (HOSPITAL_BASED_OUTPATIENT_CLINIC_OR_DEPARTMENT_OTHER): Payer: Self-pay | Admitting: *Deleted

## 2015-09-25 DIAGNOSIS — K219 Gastro-esophageal reflux disease without esophagitis: Secondary | ICD-10-CM | POA: Diagnosis not present

## 2015-09-25 DIAGNOSIS — I10 Essential (primary) hypertension: Secondary | ICD-10-CM | POA: Diagnosis not present

## 2015-09-25 DIAGNOSIS — M24111 Other articular cartilage disorders, right shoulder: Secondary | ICD-10-CM | POA: Diagnosis not present

## 2015-09-25 DIAGNOSIS — M75111 Incomplete rotator cuff tear or rupture of right shoulder, not specified as traumatic: Secondary | ICD-10-CM | POA: Insufficient documentation

## 2015-09-25 DIAGNOSIS — M19012 Primary osteoarthritis, left shoulder: Secondary | ICD-10-CM | POA: Diagnosis not present

## 2015-09-25 DIAGNOSIS — M7541 Impingement syndrome of right shoulder: Secondary | ICD-10-CM | POA: Insufficient documentation

## 2015-09-25 DIAGNOSIS — G8918 Other acute postprocedural pain: Secondary | ICD-10-CM | POA: Diagnosis not present

## 2015-09-25 DIAGNOSIS — J449 Chronic obstructive pulmonary disease, unspecified: Secondary | ICD-10-CM | POA: Diagnosis not present

## 2015-09-25 DIAGNOSIS — M25511 Pain in right shoulder: Secondary | ICD-10-CM | POA: Diagnosis not present

## 2015-09-25 DIAGNOSIS — M19011 Primary osteoarthritis, right shoulder: Secondary | ICD-10-CM | POA: Diagnosis not present

## 2015-09-25 DIAGNOSIS — M7521 Bicipital tendinitis, right shoulder: Secondary | ICD-10-CM | POA: Diagnosis not present

## 2015-09-25 DIAGNOSIS — S46011A Strain of muscle(s) and tendon(s) of the rotator cuff of right shoulder, initial encounter: Secondary | ICD-10-CM | POA: Diagnosis not present

## 2015-09-25 DIAGNOSIS — J45909 Unspecified asthma, uncomplicated: Secondary | ICD-10-CM | POA: Diagnosis not present

## 2015-09-25 HISTORY — PX: SHOULDER ARTHROSCOPY WITH DISTAL CLAVICLE RESECTION: SHX5675

## 2015-09-25 HISTORY — PX: SHOULDER ARTHROSCOPY WITH SUBACROMIAL DECOMPRESSION: SHX5684

## 2015-09-25 HISTORY — DX: Unspecified asthma, uncomplicated: J45.909

## 2015-09-25 HISTORY — PX: SHOULDER ARTHROSCOPY WITH BICEPSTENOTOMY: SHX6204

## 2015-09-25 SURGERY — SHOULDER ARTHROSCOPY WITH SUBACROMIAL DECOMPRESSION
Anesthesia: Regional | Site: Shoulder | Laterality: Right

## 2015-09-25 MED ORDER — OXYCODONE-ACETAMINOPHEN 5-325 MG PO TABS: 1.0000 | ORAL_TABLET | ORAL | 0 refills | Status: DC | PRN

## 2015-09-25 MED ORDER — FENTANYL CITRATE (PF) 100 MCG/2ML IJ SOLN
INTRAMUSCULAR | Status: AC
  Filled 2015-09-25: qty 2

## 2015-09-25 MED ORDER — CHLORHEXIDINE GLUCONATE 4 % EX LIQD: 60.0000 mL | Freq: Once | CUTANEOUS | Status: DC

## 2015-09-25 MED ORDER — PROPOFOL 500 MG/50ML IV EMUL
INTRAVENOUS | Status: AC
  Filled 2015-09-25: qty 50

## 2015-09-25 MED ORDER — LIDOCAINE HCL (CARDIAC) 20 MG/ML IV SOLN
INTRAVENOUS | Status: DC | PRN
  Administered 2015-09-25: 50 mg via INTRAVENOUS

## 2015-09-25 MED ORDER — MIDAZOLAM HCL 2 MG/2ML IJ SOLN
1.0000 mg | INTRAMUSCULAR | Status: DC | PRN
Start: 2015-09-25 — End: 2015-09-25
  Administered 2015-09-25: 2 mg via INTRAVENOUS

## 2015-09-25 MED ORDER — ONDANSETRON HCL 4 MG/2ML IJ SOLN
INTRAMUSCULAR | Status: AC
  Filled 2015-09-25: qty 2

## 2015-09-25 MED ORDER — PHENYLEPHRINE HCL 10 MG/ML IJ SOLN
INTRAMUSCULAR | Status: DC | PRN
  Administered 2015-09-25: 50 ug/min via INTRAVENOUS

## 2015-09-25 MED ORDER — LACTATED RINGERS IV SOLN
INTRAVENOUS | Status: DC
  Administered 2015-09-25: 07:00:00 via INTRAVENOUS

## 2015-09-25 MED ORDER — GLYCOPYRROLATE 0.2 MG/ML IJ SOLN: 0.2000 mg | Freq: Once | INTRAMUSCULAR | Status: DC | PRN

## 2015-09-25 MED ORDER — PROPOFOL 10 MG/ML IV BOLUS
INTRAVENOUS | Status: DC | PRN
  Administered 2015-09-25: 200 mg via INTRAVENOUS
  Administered 2015-09-25: 100 mg via INTRAVENOUS

## 2015-09-25 MED ORDER — DEXAMETHASONE SODIUM PHOSPHATE 10 MG/ML IJ SOLN
INTRAMUSCULAR | Status: AC
  Filled 2015-09-25: qty 1

## 2015-09-25 MED ORDER — HYDROCODONE-ACETAMINOPHEN 7.5-325 MG PO TABS
1.0000 | ORAL_TABLET | Freq: Once | ORAL | Status: AC | PRN
  Administered 2015-09-25: 1 via ORAL

## 2015-09-25 MED ORDER — CLINDAMYCIN PHOSPHATE 900 MG/50ML IV SOLN
900.0000 mg | INTRAVENOUS | Status: AC
  Administered 2015-09-25: 900 mg via INTRAVENOUS

## 2015-09-25 MED ORDER — SUCCINYLCHOLINE CHLORIDE 20 MG/ML IJ SOLN
INTRAMUSCULAR | Status: DC | PRN
  Administered 2015-09-25: 80 mg via INTRAVENOUS
  Administered 2015-09-25: 120 mg via INTRAVENOUS

## 2015-09-25 MED ORDER — MIDAZOLAM HCL 2 MG/2ML IJ SOLN
INTRAMUSCULAR | Status: AC
  Filled 2015-09-25: qty 2

## 2015-09-25 MED ORDER — SCOPOLAMINE 1 MG/3DAYS TD PT72: 1.0000 | MEDICATED_PATCH | Freq: Once | TRANSDERMAL | Status: DC | PRN

## 2015-09-25 MED ORDER — HYDROMORPHONE HCL 1 MG/ML IJ SOLN: 0.2500 mg | INTRAMUSCULAR | Status: DC | PRN

## 2015-09-25 MED ORDER — CLINDAMYCIN PHOSPHATE 900 MG/50ML IV SOLN
INTRAVENOUS | Status: AC
  Filled 2015-09-25: qty 50

## 2015-09-25 MED ORDER — HYDROCODONE-ACETAMINOPHEN 5-325 MG PO TABS
ORAL_TABLET | ORAL | Status: AC
  Filled 2015-09-25: qty 1

## 2015-09-25 MED ORDER — LACTATED RINGERS IV SOLN: INTRAVENOUS | Status: DC

## 2015-09-25 MED ORDER — SUCCINYLCHOLINE CHLORIDE 200 MG/10ML IV SOSY
PREFILLED_SYRINGE | INTRAVENOUS | Status: AC
Start: 2015-09-25 — End: 2015-09-25
  Filled 2015-09-25: qty 10

## 2015-09-25 MED ORDER — PROMETHAZINE HCL 25 MG/ML IJ SOLN: 6.2500 mg | INTRAMUSCULAR | Status: DC | PRN

## 2015-09-25 MED ORDER — FENTANYL CITRATE (PF) 100 MCG/2ML IJ SOLN
50.0000 ug | INTRAMUSCULAR | Status: DC | PRN
  Administered 2015-09-25: 50 ug via INTRAVENOUS
  Administered 2015-09-25: 100 ug via INTRAVENOUS

## 2015-09-25 MED ORDER — EPHEDRINE 5 MG/ML INJ
INTRAVENOUS | Status: AC
  Filled 2015-09-25: qty 10

## 2015-09-25 MED ORDER — BUPIVACAINE-EPINEPHRINE (PF) 0.5% -1:200000 IJ SOLN
INTRAMUSCULAR | Status: DC | PRN
  Administered 2015-09-25: 20 mL via PERINEURAL

## 2015-09-25 MED ORDER — ONDANSETRON HCL 4 MG/2ML IJ SOLN
INTRAMUSCULAR | Status: DC | PRN
  Administered 2015-09-25: 4 mg via INTRAVENOUS

## 2015-09-25 MED ORDER — SODIUM CHLORIDE 0.9 % IR SOLN
Status: DC | PRN
  Administered 2015-09-25: 9000 mL

## 2015-09-25 MED ORDER — DEXAMETHASONE SODIUM PHOSPHATE 4 MG/ML IJ SOLN
INTRAMUSCULAR | Status: DC | PRN
  Administered 2015-09-25: 10 mg via INTRAVENOUS

## 2015-09-25 MED ORDER — LIDOCAINE 2% (20 MG/ML) 5 ML SYRINGE
INTRAMUSCULAR | Status: AC
Start: 2015-09-25 — End: 2015-09-25
  Filled 2015-09-25: qty 5

## 2015-09-25 MED ORDER — EPHEDRINE SULFATE 50 MG/ML IJ SOLN
INTRAMUSCULAR | Status: DC | PRN
  Administered 2015-09-25: 10 mg via INTRAVENOUS

## 2015-09-25 SURGICAL SUPPLY — 69 items
APL SKNCLS STERI-STRIP NONHPOA (GAUZE/BANDAGES/DRESSINGS)
BENZOIN TINCTURE PRP APPL 2/3 (GAUZE/BANDAGES/DRESSINGS) IMPLANT
BLADE CUTTER GATOR 3.5 (BLADE) ×3 IMPLANT
BLADE CUTTER MENIS 5.5 (BLADE) IMPLANT
BLADE GREAT WHITE 4.2 (BLADE) ×3 IMPLANT
BLADE SURG 15 STRL LF DISP TIS (BLADE) ×2 IMPLANT
BLADE SURG 15 STRL SS (BLADE) ×3
BUR OVAL 6.0 (BURR) ×3 IMPLANT
CANNULA DRY DOC 8X75 (CANNULA) IMPLANT
CANNULA TWIST IN 8.25X7CM (CANNULA) IMPLANT
DECANTER SPIKE VIAL GLASS SM (MISCELLANEOUS) IMPLANT
DRAPE STERI 35X30 U-POUCH (DRAPES) ×3 IMPLANT
DRAPE U-SHAPE 47X51 STRL (DRAPES) ×3 IMPLANT
DRAPE U-SHAPE 76X120 STRL (DRAPES) ×6 IMPLANT
DRSG PAD ABDOMINAL 8X10 ST (GAUZE/BANDAGES/DRESSINGS) ×3 IMPLANT
DURAPREP 26ML APPLICATOR (WOUND CARE) ×3 IMPLANT
ELECT MENISCUS 165MM 90D (ELECTRODE) ×3 IMPLANT
ELECT REM PT RETURN 9FT ADLT (ELECTROSURGICAL) ×3
ELECTRODE REM PT RTRN 9FT ADLT (ELECTROSURGICAL) ×2 IMPLANT
GAUZE SPONGE 4X4 12PLY STRL (GAUZE/BANDAGES/DRESSINGS) ×6 IMPLANT
GAUZE XEROFORM 1X8 LF (GAUZE/BANDAGES/DRESSINGS) ×3 IMPLANT
GLOVE BIOGEL PI IND STRL 7.0 (GLOVE) ×4 IMPLANT
GLOVE BIOGEL PI INDICATOR 7.0 (GLOVE) ×3
GLOVE ECLIPSE 6.5 STRL STRAW (GLOVE) ×2 IMPLANT
GLOVE ECLIPSE 7.0 STRL STRAW (GLOVE) ×3 IMPLANT
GLOVE SURG ORTHO 8.0 STRL STRW (GLOVE) ×3 IMPLANT
GOWN STRL REUS W/ TWL LRG LVL3 (GOWN DISPOSABLE) ×2 IMPLANT
GOWN STRL REUS W/ TWL XL LVL3 (GOWN DISPOSABLE) ×4 IMPLANT
GOWN STRL REUS W/TWL LRG LVL3 (GOWN DISPOSABLE) ×3
GOWN STRL REUS W/TWL XL LVL3 (GOWN DISPOSABLE) ×6
IV NS IRRIG 3000ML ARTHROMATIC (IV SOLUTION) ×10 IMPLANT
MANIFOLD NEPTUNE II (INSTRUMENTS) ×3 IMPLANT
NDL SCORPION MULTI FIRE (NEEDLE) IMPLANT
NDL SUT 6 .5 CRC .975X.05 MAYO (NEEDLE) IMPLANT
NEEDLE MAYO TAPER (NEEDLE)
NEEDLE SCORPION MULTI FIRE (NEEDLE) IMPLANT
NS IRRIG 1000ML POUR BTL (IV SOLUTION) IMPLANT
PACK ARTHROSCOPY DSU (CUSTOM PROCEDURE TRAY) ×3 IMPLANT
PASSER SUT SWANSON 36MM LOOP (INSTRUMENTS) IMPLANT
PENCIL BUTTON HOLSTER BLD 10FT (ELECTRODE) ×3 IMPLANT
SET ARTHROSCOPY TUBING (MISCELLANEOUS) ×3
SET ARTHROSCOPY TUBING LN (MISCELLANEOUS) ×2 IMPLANT
SLEEVE SCD COMPRESS KNEE MED (MISCELLANEOUS) IMPLANT
SLING ARM FOAM STRAP LRG (SOFTGOODS) ×2 IMPLANT
SLING ARM IMMOBILIZER LRG (SOFTGOODS) IMPLANT
SLING ARM IMMOBILIZER MED (SOFTGOODS) IMPLANT
SLING ARM MED ADULT FOAM STRAP (SOFTGOODS) IMPLANT
SLING ARM XL FOAM STRAP (SOFTGOODS) IMPLANT
SPONGE LAP 4X18 X RAY DECT (DISPOSABLE) IMPLANT
STRIP CLOSURE SKIN 1/2X4 (GAUZE/BANDAGES/DRESSINGS) IMPLANT
SUCTION FRAZIER HANDLE 10FR (MISCELLANEOUS)
SUCTION TUBE FRAZIER 10FR DISP (MISCELLANEOUS) IMPLANT
SUT ETHIBOND 2 OS 4 DA (SUTURE) IMPLANT
SUT ETHILON 2 0 FS 18 (SUTURE) IMPLANT
SUT ETHILON 3 0 PS 1 (SUTURE) ×2 IMPLANT
SUT FIBERWIRE #2 38 T-5 BLUE (SUTURE)
SUT RETRIEVER MED (INSTRUMENTS) IMPLANT
SUT TIGER TAPE 7 IN WHITE (SUTURE) IMPLANT
SUT VIC AB 0 CT1 27 (SUTURE)
SUT VIC AB 0 CT1 27XBRD ANBCTR (SUTURE) IMPLANT
SUT VIC AB 2-0 SH 27 (SUTURE)
SUT VIC AB 2-0 SH 27XBRD (SUTURE) IMPLANT
SUT VIC AB 3-0 FS2 27 (SUTURE) IMPLANT
SUTURE FIBERWR #2 38 T-5 BLUE (SUTURE) IMPLANT
TAPE FIBER 2MM 7IN #2 BLUE (SUTURE) IMPLANT
TOWEL OR 17X24 6PK STRL BLUE (TOWEL DISPOSABLE) ×3 IMPLANT
TOWEL OR NON WOVEN STRL DISP B (DISPOSABLE) ×3 IMPLANT
WATER STERILE IRR 1000ML POUR (IV SOLUTION) ×3 IMPLANT
YANKAUER SUCT BULB TIP NO VENT (SUCTIONS) IMPLANT

## 2015-09-25 NOTE — Anesthesia Procedure Notes (Addendum)
Anesthesia Regional Block:  Interscalene brachial plexus block  Pre-Anesthetic Checklist: ,, timeout performed, Correct Patient, Correct Site, Correct Laterality, Correct Procedure, Correct Position, site marked, Risks and benefits discussed,  Surgical consent,  Pre-op evaluation,  At surgeon's request and post-op pain management  Laterality: Right  Prep: chloraprep       Needles:  Injection technique: Single-shot  Needle Type: Echogenic Stimulator Needle     Needle Length: 9cm 9 cm Needle Gauge: 21 and 21 G    Additional Needles:  Procedures: ultrasound guided (picture in chart) and nerve stimulator Interscalene brachial plexus block  Nerve Stimulator or Paresthesia:  Response: deltoid, 0.5 mA,   Additional Responses:   Narrative:  Start time: 09/25/2015 7:15 AM End time: 09/25/2015 7:22 AM Injection made incrementally with aspirations every 5 mL.  Performed by: Personally  Anesthesiologist: Suzette Battiest

## 2015-09-25 NOTE — Transfer of Care (Signed)
Immediate Anesthesia Transfer of Care Note  Patient: Anita Arroyo  Procedure(s) Performed: Procedure(s): RIGHT SHOULDER ARTHROSCOPY DEBRIDEMENT,ACROMIOPLASTY,DISTAL CLAVICAL EXCISION, RELEASE OF BICEPS TENDON (Right) SHOULDER ARTHROSCOPY WITH DISTAL CLAVICLE RESECTION (Right) SHOULDER ARTHROSCOPY WITH BICEPSTENOTOMY (Right)  Patient Location: PACU  Anesthesia Type:General  Level of Consciousness: awake and sedated  Airway & Oxygen Therapy: Patient Spontanous Breathing and Patient connected to face mask oxygen  Post-op Assessment: Report given to RN and Post -op Vital signs reviewed and stable  Post vital signs: Reviewed and stable  Last Vitals:  Vitals:   09/25/15 0647 09/25/15 0715  BP: 104/63   Pulse: 69 66  Resp: 18 14  Temp: 37 C     Last Pain:  Vitals:   09/25/15 0715  TempSrc:   PainSc: 6          Complications: No apparent anesthesia complications

## 2015-09-25 NOTE — H&P (Signed)
Assisted Dr. Rob Fitzgerald with right, ultrasound guided, supraclavicular block. Side rails up, monitors on throughout procedure. See vital signs in flow sheet. Tolerated Procedure well. 

## 2015-09-25 NOTE — Interval H&P Note (Signed)
History and Physical Interval Note:  09/25/2015 7:29 AM  Anita Arroyo  has presented today for surgery, with the diagnosis of RIGHT ARTHROSCOPY SHOULDER OA,IMPINGEMENT SYNDROME ,STRAIN MUSCLE AND TENDON OF THE ROTATOR CUFF  The various methods of treatment have been discussed with the patient and family. After consideration of risks, benefits and other options for treatment, the patient has consented to  Procedure(s): RIGHT SHOULDER ARTHROSCOPY DEBRIDEMENT,ACROMIOPLASTY,DISTAL CLAVICAL EXCISION,ROTATOR CUFF REPAIR,BICEPS TENODESIS (Right) as a surgical intervention .  The patient's history has been reviewed, patient examined, no change in status, stable for surgery.  I have reviewed the patient's chart and labs.  Questions were answered to the patient's satisfaction.     Ninetta Lights

## 2015-09-25 NOTE — Anesthesia Preprocedure Evaluation (Signed)
Anesthesia Evaluation  Patient identified by MRN, date of birth, ID band Patient awake    Reviewed: Allergy & Precautions, NPO status , Patient's Chart, lab work & pertinent test results, reviewed documented beta blocker date and time   Airway Mallampati: III  TM Distance: >3 FB     Dental   Pulmonary asthma , COPD,  COPD inhaler, Current Smoker,    breath sounds clear to auscultation       Cardiovascular hypertension, Pt. on medications and Pt. on home beta blockers  Rhythm:Regular Rate:Normal     Neuro/Psych Depression negative neurological ROS     GI/Hepatic Neg liver ROS, GERD  ,  Endo/Other  negative endocrine ROS  Renal/GU negative Renal ROS     Musculoskeletal   Abdominal   Peds  Hematology negative hematology ROS (+)   Anesthesia Other Findings   Reproductive/Obstetrics                             Anesthesia Physical Anesthesia Plan  ASA: III  Anesthesia Plan: General and Regional   Post-op Pain Management:  Regional for Post-op pain   Induction: Intravenous  Airway Management Planned: Oral ETT  Additional Equipment:   Intra-op Plan:   Post-operative Plan: Extubation in OR  Informed Consent: I have reviewed the patients History and Physical, chart, labs and discussed the procedure including the risks, benefits and alternatives for the proposed anesthesia with the patient or authorized representative who has indicated his/her understanding and acceptance.   Dental advisory given  Plan Discussed with: CRNA  Anesthesia Plan Comments:         Anesthesia Quick Evaluation

## 2015-09-25 NOTE — Anesthesia Postprocedure Evaluation (Signed)
Anesthesia Post Note  Patient: Anita Arroyo  Procedure(s) Performed: Procedure(s) (LRB): RIGHT SHOULDER ARTHROSCOPY DEBRIDEMENT,ACROMIOPLASTY,DISTAL CLAVICAL EXCISION, RELEASE OF BICEPS TENDON (Right) SHOULDER ARTHROSCOPY WITH DISTAL CLAVICLE RESECTION (Right) SHOULDER ARTHROSCOPY WITH BICEPSTENOTOMY (Right)  Patient location during evaluation: PACU Anesthesia Type: General and Regional Level of consciousness: awake and alert Pain management: pain level controlled Vital Signs Assessment: post-procedure vital signs reviewed and stable Respiratory status: spontaneous breathing, nonlabored ventilation, respiratory function stable and patient connected to nasal cannula oxygen Cardiovascular status: blood pressure returned to baseline and stable Postop Assessment: no signs of nausea or vomiting Anesthetic complications: no    Last Vitals:  Vitals:   09/25/15 0915 09/25/15 0930  BP: 101/65 105/61  Pulse: 69 73  Resp: 19 (!) 23  Temp:      Last Pain:  Vitals:   09/25/15 0845  TempSrc:   PainSc: 0-No pain                 Tiajuana Amass

## 2015-09-25 NOTE — Anesthesia Procedure Notes (Signed)
Procedure Name: Intubation Performed by: Terrance Mass Pre-anesthesia Checklist: Patient identified, Emergency Drugs available, Suction available and Patient being monitored Patient Re-evaluated:Patient Re-evaluated prior to inductionOxygen Delivery Method: Circle system utilized Preoxygenation: Pre-oxygenation with 100% oxygen Intubation Type: IV induction Ventilation: Mask ventilation without difficulty Laryngoscope Size: Miller, 2 and Glidescope Grade View: Grade I Tube type: Oral Tube size: 7.0 mm Number of attempts: 1 Airway Equipment and Method: Stylet and Oral airway Placement Confirmation: ETT inserted through vocal cords under direct vision,  positive ETCO2 and breath sounds checked- equal and bilateral Secured at: 22 cm Tube secured with: Tape Dental Injury: Teeth and Oropharynx as per pre-operative assessment

## 2015-09-25 NOTE — Discharge Instructions (Signed)
Shouder arthroscopy, partial rotator cuff tear debridement subacromial decompression Care After Instructions Refer to this sheet in the next few weeks. These discharge instructions provide you with general information on caring for yourself after you leave the hospital. Your caregiver may also give you specific instructions. Your treatment has been planned according to the most current medical practices available, but unavoidable complications sometimes occur. If you have any problems or questions after discharge, please call your caregiver. HOME INSTRUCTIONS You may resume a normal diet and activities as directed. Take showers instead of baths until informed otherwise.  Change bandages (dressings) in 3 days.  Swab wounds daily with betadine.  Wash shoulder with soap and water.  Pat dry.  Cover wounds with bandaids. Only take over-the-counter or prescription medicines for pain, discomfort, or fever as directed by your caregiver.  Wear your sling for the next 2 days unless otherwise instructed. Eat a well-balanced diet.  Avoid lifting or driving until you are instructed otherwise.  Make an appointment to see your caregiver for stitches (suture) or staple removal one week after surgery.   Regional Anesthesia Blocks  1. Numbness or the inability to move the "blocked" extremity may last from 3-48 hours after placement. The length of time depends on the medication injected and your individual response to the medication. If the numbness is not going away after 48 hours, call your surgeon.  2. The extremity that is blocked will need to be protected until the numbness is gone and the  Strength has returned. Because you cannot feel it, you will need to take extra care to avoid injury. Because it may be weak, you may have difficulty moving it or using it. You may not know what position it is in without looking at it while the block is in effect.  3. For blocks in the legs and feet, returning to weight  bearing and walking needs to be done carefully. You will need to wait until the numbness is entirely gone and the strength has returned. You should be able to move your leg and foot normally before you try and bear weight or walk. You will need someone to be with you when you first try to ensure you do not fall and possibly risk injury.  4. Bruising and tenderness at the needle site are common side effects and will resolve in a few days.  5. Persistent numbness or new problems with movement should be communicated to the surgeon or the [place] [phone] [place] [phone]).  SEEK MEDICAL CARE IF: You have swelling of your calf or leg.  You develop shortness of breath or chest pain.  You have redness, swelling, or increasing pain in the wound.  There is pus or any unusual drainage coming from the surgical site.  You notice a bad smell coming from the surgical site or dressing.  The surgical site breaks open after sutures or staples have been removed.  There is persistent bleeding from the suture or staple line.  You are getting worse or are not improving.  You have any other questions or concerns.  SEEK IMMEDIATE MEDICAL CARE IF:  You have a fever greater than 101 You develop a rash.  You have difficulty breathing.  You develop any reaction or side effects to medicines given.  Your knee motion is decreasing rather than improving.  MAKE SURE YOU:  Understand these instructions.  Will watch your condition.  Will get help right away if you are not doing  well or get worse.     Post Anesthesia Home Care Instructions  Activity: Get plenty of rest for the remainder of the day. A responsible adult should stay with you for 24 hours following the procedure.  For the next 24 hours, DO NOT: -Drive a car -Paediatric nurse -Drink alcoholic beverages -Take any medication unless instructed by your physician -Make any legal decisions or sign important  papers.  Meals: Start with liquid foods such as gelatin or soup. Progress to regular foods as tolerated. Avoid greasy, spicy, heavy foods. If nausea and/or vomiting occur, drink only clear liquids until the nausea and/or vomiting subsides. Call your physician if vomiting continues.  Special Instructions/Symptoms: Your throat may feel dry or sore from the anesthesia or the breathing tube placed in your throat during surgery. If this causes discomfort, gargle with warm salt water. The discomfort should disappear within 24 hours.  If you had a scopolamine patch placed behind your ear for the management of post- operative nausea and/or vomiting:  1. The medication in the patch is effective for 72 hours, after which it should be removed.  Wrap patch in a tissue and discard in the trash. Wash hands thoroughly with soap and water. 2. You may remove the patch earlier than 72 hours if you experience unpleasant side effects which may include dry mouth, dizziness or visual disturbances. 3. Avoid touching the patch. Wash your hands with soap and water after contact with the patch.

## 2015-09-26 ENCOUNTER — Encounter (HOSPITAL_BASED_OUTPATIENT_CLINIC_OR_DEPARTMENT_OTHER): Payer: Self-pay | Admitting: Orthopedic Surgery

## 2015-09-26 NOTE — Op Note (Signed)
[name], Anita Arroyo NO.:  [id number]  MEDICAL RECORD NO.:  [id number]  LOCATION:                                 FACILITY:  PHYSICIAN:  Ninetta Lights, M.D. DATE OF BIRTH:  1960-04-12  DATE OF PROCEDURE:  09/25/2015 DATE OF DISCHARGE:                              OPERATIVE REPORT   PREOPERATIVE DIAGNOSES:  Right shoulder chronic impingement, failed conservative treatment.  Distal clavicle osteolysis.  Partial tearing rotator cuff.  POSTOPERATIVE DIAGNOSES:  Right shoulder chronic impingement, failed conservative treatment.  Distal clavicle osteolysis.  Partial tearing rotator cuff. Superficial tearing on the top of the cuff, nothing full-thickness. Extensive labral tears with tearing of long head biceps tendon at the attachment to the glenoid.  PROCEDURE:  Right shoulder exam under anesthesia, arthroscopy. Debridement of the labrum.  Release resection intra-articular portion of biceps tendon.  Debridement of rotator cuff.  Bursectomy, acromioplasty, CA ligament release.  Excision of distal clavicle.  SURGEON-MURPHY:  Ninetta Lights, M.D.  ASSISTANT:  Elmyra Ricks, P.A., present throughout the entire case and necessary for timely completion of procedure.  ANESTHESIA:  General.  SPECIMENS:  None.  CULTURES:  None.  COMPLICATIONS:  None.  DRESSINGS:  Soft compressive with sling.  DESCRIPTION OF PROCEDURE:  The patient was brought to the operating room and after adequate anesthesia had been obtained, shoulder examined. Full motion stable shoulder.  Placed in beach-chair position on the shoulder positioner, prepped and draped in usual sterile fashion.  Three portals anterior, posterior, and lateral.  Arthroscope introduced, shoulder distended and inspected.  Articular cartilage looked good. Extensive tearing superior labrum debrided.  More than 50% compromise biceps tendon at the attachment.  Release resected intra-articular portion of  biceps allowing it to recess of the bicipital groove. Remaining ligamentous structures intact.  Articular cartilage looked good.  Cannula redirected subacromially.  A lot of reactive bursitis debrided.  Type 2 acromion.  Top of the cuff debrided.  A lot of abrasive changes, but no significant full-thickness, all are deep partial tears.  Acromioplasty from type 2 to type 1 acromion with shaver and bur.  CA ligament release.  Distal clavicle grade 3 and 4 changes. Periarticular spurs and lateral centimeter of clavicle resected. Adequacy of decompression debridement confirmed viewing from all portals.  Instruments and fluid were removed.  Portals were closed with nylon.  Sterile compressive dressing applied. Anesthesia reversed.  Brought to the recovery room.  Tolerated the surgery well.  No complications.     Ninetta Lights, M.D.   ______________________________ Ninetta Lights, M.D.    DFM/MEDQ  D:  09/25/2015  T:  09/25/2015  Job:  [id number]

## 2015-10-03 ENCOUNTER — Other Ambulatory Visit (HOSPITAL_COMMUNITY): Payer: Self-pay

## 2015-10-03 DIAGNOSIS — M7521 Bicipital tendinitis, right shoulder: Secondary | ICD-10-CM | POA: Diagnosis not present

## 2015-10-03 DIAGNOSIS — M7541 Impingement syndrome of right shoulder: Secondary | ICD-10-CM | POA: Diagnosis not present

## 2015-10-03 DIAGNOSIS — F3162 Bipolar disorder, current episode mixed, moderate: Secondary | ICD-10-CM

## 2015-10-03 DIAGNOSIS — F319 Bipolar disorder, unspecified: Secondary | ICD-10-CM

## 2015-10-03 MED ORDER — VENLAFAXINE HCL ER 150 MG PO CP24: 150.0000 mg | ORAL_CAPSULE | Freq: Every day | ORAL | 0 refills | Status: DC

## 2015-10-03 MED ORDER — TRAZODONE HCL 100 MG PO TABS: 200.0000 mg | ORAL_TABLET | Freq: Every day | ORAL | 0 refills | Status: DC

## 2015-10-03 MED ORDER — LAMOTRIGINE 150 MG PO TABS: 150.0000 mg | ORAL_TABLET | Freq: Two times a day (BID) | ORAL | 0 refills | Status: DC

## 2015-10-08 ENCOUNTER — Ambulatory Visit (HOSPITAL_COMMUNITY): Payer: Self-pay | Admitting: Psychiatry

## 2015-10-09 ENCOUNTER — Ambulatory Visit: Payer: Commercial Managed Care - HMO | Attending: Orthopedic Surgery

## 2015-10-09 DIAGNOSIS — M25611 Stiffness of right shoulder, not elsewhere classified: Secondary | ICD-10-CM | POA: Diagnosis not present

## 2015-10-09 DIAGNOSIS — M25511 Pain in right shoulder: Secondary | ICD-10-CM | POA: Diagnosis not present

## 2015-10-09 DIAGNOSIS — M6281 Muscle weakness (generalized): Secondary | ICD-10-CM | POA: Insufficient documentation

## 2015-10-09 NOTE — Therapy (Addendum)
[facility] [address], [place], [place], [address] Phone: [phone]   Fax:  [phone]  Physical Therapy Evaluation  Patient Details  Name: Anita Arroyo MRN: [id number] Date of Birth: 1960-11-24 Referring Provider: Kathryne Hitch, MD  Encounter Date: 10/09/2015      PT End of Session - 10/09/15 1234    Visit Number 1   Number of Visits 10   Date for PT Re-Evaluation 12/04/15   PT Start Time [id number]   PT Stop Time 1232   PT Time Calculation (min) 45 min   Activity Tolerance Patient tolerated treatment well   Behavior During Therapy [facility] for tasks assessed/performed      Past Medical History:  Diagnosis Date  . ADHD (attention deficit hyperactivity disorder)   . Arthritis   . Asthma   . Bipolar 1 disorder ([place])   . Bipolar 1 disorder ([place])   . Breast cancer ([place])   . Depression   . Dysrhythmia    palpitations  . Dysrhythmia    atrial fibrillation  . Fibromyalgia   . GERD (gastroesophageal reflux disease)   . Heart murmur   . History of stress test 04/28/2011   showed normal perfusion without scar or ischemia  . Hx of echocardiogram 04/28/2011   showed normal systolic function with mild diastolic dysfunction,she had trace MR but did not have frank mitral valve prolapse demonstrated. She had mild pulmonary hypertension with an estimated RV systolic pressure at 34 mm.  . Hyperlipemia   . Meniere disease   . MVP (mitral valve prolapse)   . Neuromuscular disorder ([place])    fibromyalgia    Past Surgical History:  Procedure Laterality Date  . ANKLE SURGERY     left x4  . BREAST SURGERY  2011   lumpectomy right breast   . COLONOSCOPY WITH PROPOFOL N/A 02/25/2014   Procedure: COLONOSCOPY WITH PROPOFOL;  Surgeon: Garlan Fair, MD;  Location: [facility];  Service: Endoscopy;  Laterality: N/A;  . deviated septum    . ECTOPIC PREGNANCY SURGERY    . ELBOW SURGERY     right elbow  . EYE SURGERY     cataract surgery  . fibroid     2-3 fibroid adenomas removed  . JOINT REPLACEMENT  2011   right knee   . KNEE SURGERY     left knee  . SHOULDER ARTHROSCOPY WITH BICEPSTENOTOMY Right 09/25/2015   Procedure: SHOULDER ARTHROSCOPY WITH BICEPSTENOTOMY;  Surgeon: Ninetta Lights, MD;  Location: [facility];  Service: Orthopedics;  Laterality: Right;  . SHOULDER ARTHROSCOPY WITH DISTAL CLAVICLE RESECTION Right 09/25/2015   Procedure: SHOULDER ARTHROSCOPY WITH DISTAL CLAVICLE RESECTION;  Surgeon: Ninetta Lights, MD;  Location: [facility];  Service: Orthopedics;  Laterality: Right;  . SHOULDER ARTHROSCOPY WITH SUBACROMIAL DECOMPRESSION Right 09/25/2015   Procedure: RIGHT SHOULDER ARTHROSCOPY DEBRIDEMENT,ACROMIOPLASTY,DISTAL CLAVICAL EXCISION, RELEASE OF BICEPS TENDON;  Surgeon: Ninetta Lights, MD;  Location: [facility];  Service: Orthopedics;  Laterality: Right;  . TMJ ARTHROPLASTY    . TONSILLECTOMY    . UTERINE FIBROID SURGERY     polups and fibroids removed   . WRIST SURGERY     left    There were no vitals filed for this visit.       Subjective Assessment - 10/09/15 1154    Subjective Pt presents to PT s/p Rt shoulder arthroscopy for subacromial decompression and distal clavicle resection.  Pt plans to have Lt shoulder  replaced 12/2015.     Pertinent History Rt shoulder arthroscopy for subacromial decompression and distal clavicle resection 09/25/15, pt is hard of hearing bilaterally, history of cancer- NO ULTRASOUND   Limitations House hold activities   How long can you walk comfortably? limited use of Rt UE: 50% of normal   Patient Stated Goals improve use of the Rt UE, reduce pain, improve strength and AROM   Currently in Pain? Yes   Pain Score 6    Pain Location Shoulder   Pain Orientation Right   Pain Descriptors / Indicators Throbbing;Aching;Sharp   Pain Type Surgical pain   Pain Onset 1 to 4 weeks ago   Pain Frequency Constant    Aggravating Factors  use   Pain Relieving Factors muscle relaxer, pain meds, rest, immobilization            [facility] PT Assessment - 10/09/15 0001      Assessment   Medical Diagnosis s/p acromioplasty Rt, SAD   Referring Provider Kathryne Hitch, MD   Onset Date/Surgical Date 09/25/15   Hand Dominance Right   Next MD Visit 10/31/15     Precautions   Precautions Other (comment)  gentle ROM and strength, cancer no [place]     Restrictions   Weight Bearing Restrictions No     Balance Screen   Has the patient fallen in the past 6 months No   Has the patient had a decrease in activity level because of a fear of falling?  No   Is the patient reluctant to leave their home because of a fear of falling?  No     Home Environment   Living Environment Private residence   Living Arrangements Alone   Type of [place]     Prior Function   Level of Independence Independent with basic ADLs   Vocation Part time employment   Vocation Requirements works as a Actor   Overall Cognitive Status Within Functional Limits for tasks assessed     Observation/Other Assessments   Focus on Therapeutic Outcomes (FOTO)  51% limitation     Posture/Postural Control   Posture/Postural Control Postural limitations   Postural Limitations Rounded Shoulders;Forward head     ROM / Strength   AROM / PROM / Strength AROM;PROM;Strength     AROM   Overall AROM  Deficits   Overall AROM Comments Lt shoulder not tested as it will be replaced 12/2015 and has significant pain   AROM Assessment Site Shoulder   Right/Left Shoulder Right   Right Shoulder Flexion 90 Degrees   Right Shoulder ABduction 55 Degrees   Right Shoulder Internal Rotation --  to Rt buttock   Right Shoulder External Rotation --  T1     PROM   Overall PROM  Deficits   PROM Assessment Site Shoulder   Right/Left Shoulder Right   Right Shoulder Flexion 125 Degrees   Right Shoulder ABduction 110 Degrees    Right Shoulder Internal Rotation 60 Degrees   Right Shoulder External Rotation 45 Degrees     Strength   Overall Strength Deficits   Overall Strength Comments not tested due to pain with movement     Palpation   Palpation comment bruising over Rt anterior shoulder and biceps, steristrips present with healing surgical incisions.  Tender over all aspects of the Rt genohumeral joint and deltoids  PT Education - 10/09/15 1214    Education provided Yes   Education Details sling exercises for mobility   Person(s) Educated Patient   Methods Explanation;Demonstration;Handout   Comprehension Verbalized understanding;Returned demonstration          PT Short Term Goals - 10/09/15 1230      PT SHORT TERM GOAL #1   Title be independent in initial HEP   Time 4   Period Weeks   Status New     PT SHORT TERM GOAL #2   Title demonstrate Rt shoulder AROM flexion to > or = to 110 degrees to improve reacing overhead   Time 4   Period Weeks   Status New     PT SHORT TERM GOAL #3   Title report a 30% reduction in Rt shoulder pain with use of Rt UE with self-care and ADLs   Time 4   Period Weeks   Status New     PT SHORT TERM GOAL #4   Title demonstrate 4-/5 Rt shoulder strength throughout to improve endurance with use   Time 4   Period Weeks   Status New           PT Long Term Goals - 10/09/15 1146      PT LONG TERM GOAL #1   Title be independent in advanced HEP   Time 4   Period Weeks   Status New     PT LONG TERM GOAL #2   Title reduce FOTO to < or = to 36% limitation   Time 8   Period Weeks   Status New     PT LONG TERM GOAL #3   Title demonstrate Rt shoulder AROM IR to L2 or higher to assist with self-care   Time 8   Period Weeks   Status New     PT LONG TERM GOAL #4   Title report a 60% reduction in Rt shoulder pain with use   Time 8   Period Weeks   Status New     PT LONG TERM GOAL #5   Title demonstrate >  or = to 4 to 4+/5 Rt shoulder strength to improve endurance with use of Rt UE   Time 8   Period Weeks   Status New               Plan - 10/09/15 1218    Clinical Impression Statement Pt presents to PT 2 weeks s/p Rt shoulder arthroscopy for subacromial decompression and distal clavicle resection.  Pt wore a sling for 2 days.  Pt with Rt shoulder pain, guarded posture, limited Rt shoulder AROM and strength and Rt sided neck pain.  Pt reports 50% use of of Rt UE with ADLs and self-care and is limited to overhead use and lifting.  Pt will benefit from skilled PT for Rt shoulder AROM and gentle strength progression and pain management to allow for return to regular use of Rt UE for home and work tasks.     Rehab Potential Good   PT Frequency 2x / week   PT Duration 8 weeks   PT Treatment/Interventions ADLs/Self Care Home Management;Electrical Stimulation;Cryotherapy;Functional mobility training;Moist Heat;Therapeutic activities;Therapeutic exercise;Neuromuscular re-education;Patient/family education;Passive range of motion;Scar mobilization;Manual techniques;Vasopneumatic Device;Taping   PT Next Visit Plan Try game ready, Rt shoulder AROM, neck flexiblity, gentle strength, e-stim and ice.  Follow post-op protocol   Consulted and Agree with Plan of Care Patient      Patient will benefit from skilled therapeutic intervention  in order to improve the following deficits and impairments:  Decreased range of motion, Pain, Increased muscle spasms, Decreased endurance, Decreased activity tolerance, Postural dysfunction, Increased edema, Decreased strength, Impaired flexibility  Visit Diagnosis: Pain in right shoulder - Plan: PT plan of care cert/re-cert  Stiffness of right shoulder, not elsewhere classified - Plan: PT plan of care cert/re-cert  Muscle weakness (generalized) - Plan: PT plan of care cert/re-cert      G-Codes - [id number]    Functional Assessment Tool Used FOTO: 51%  limitation   Functional Limitation Other PT primary   Other PT Primary Current Status [id number]) At least 40 percent but less than 60 percent impaired, limited or restricted   Other PT Primary Goal Status [id number]) At least 20 percent but less than 40 percent impaired, limited or restricted       Problem List Patient Active Problem List   Diagnosis Date Noted  . History of palpitations 09/16/2015  . Pre-operative cardiovascular examination 09/16/2015  . Bipolar 1 disorder ([place]) 09/16/2015  . Palpitations 08/28/2014  . Hyperlipidemia LDL goal <70 11/26/2013  . History of breast cancer 11/16/2012  . Fibromyalgia 11/16/2012  . History of tobacco abuse 11/16/2012  . MRSA 04/13/2010  . INTERTRIGO 04/13/2010  . GROUP B STREPTOCOCCUS INFECTION, HX OF 04/13/2010  . HYPERLIPIDEMIA 09/02/2006  . DEPRESSION 09/02/2006  . Essential hypertension 09/02/2006  . COPD 09/02/2006  . GERD 09/02/2006    Sigurd Sos, PT 10/09/15 12:44 PM  [facility] [address] [place], [place], [address] Phone: [phone]   Fax:  [phone]  Name: Anita Arroyo MRN: [id number] Date of Birth: 01/03/1961

## 2015-10-09 NOTE — Patient Instructions (Addendum)
ROM: Pendulum (Circular)  Let right arm move in circle clockwise, then counterclockwise, by rocking body weight in circular pattern. Circle _10___ times each direction per set. Do _3___ sessions per day.  Pendulum Side to Side  Bend forward 90 at waist, leaning on table for support. Rock body from side to side and let arm swing freely. Repeat _10___ times. Do __3__ sessions per day.  Finger Flexors  Keeping right fingertips straight, press putty toward base of palm. Repeat __20__ times. Do __3__ sessions per day. Activity: Squeeze flour sifter, plastic squeeze bottles, [place] baster, juice from fruit.*  AROM: Elbow Flexion / Extension  With left hand palm up, gently bend elbow as far as possible. Then straighten arm as far as possible. Repeat __10__ times per set. Do __3__ sessions per day.  SHOULDER: Flexion On Table  Place hands on table, elbows straight. Move hips away from body. Press hands down into table. Hold _3__ seconds. _10__ reps per set, __3_ sets per day.  Elevation: Shrug (Distal Resist)  Also add circles forward and back   Lift shoulders straight up, then return. Maintain same speed up and down. Avoid moving head and neck forward. Repeat _10___ times per set. Do __1-2__ sets per session. Do many times daily. Copyright  VHI. All rights reserved.   AROM: Lateral Neck Flexion    Slowly tilt head toward one shoulder, then the other. Hold each position __20__ seconds. Repeat __3__ times per set. Do __3__ sets per session. Do ____ sessions per day.  http://orth.exer.us/297   Copyright  VHI. All rights reserved.   [facility] [address] [place], [place] [address] Phone # [phone] Fax [phone]

## 2015-10-13 ENCOUNTER — Encounter: Payer: Self-pay | Admitting: Physical Therapy

## 2015-10-14 ENCOUNTER — Encounter: Payer: Self-pay | Admitting: Physician Assistant

## 2015-10-14 DIAGNOSIS — G8929 Other chronic pain: Secondary | ICD-10-CM | POA: Diagnosis not present

## 2015-10-14 DIAGNOSIS — E785 Hyperlipidemia, unspecified: Secondary | ICD-10-CM | POA: Diagnosis not present

## 2015-10-14 DIAGNOSIS — M159 Polyosteoarthritis, unspecified: Secondary | ICD-10-CM | POA: Diagnosis not present

## 2015-10-14 DIAGNOSIS — Z79899 Other long term (current) drug therapy: Secondary | ICD-10-CM | POA: Diagnosis not present

## 2015-10-15 ENCOUNTER — Ambulatory Visit: Payer: Commercial Managed Care - HMO | Admitting: Physical Therapy

## 2015-10-15 ENCOUNTER — Encounter: Payer: Self-pay | Admitting: Physical Therapy

## 2015-10-15 DIAGNOSIS — M25511 Pain in right shoulder: Secondary | ICD-10-CM

## 2015-10-15 DIAGNOSIS — M25611 Stiffness of right shoulder, not elsewhere classified: Secondary | ICD-10-CM

## 2015-10-15 DIAGNOSIS — M6281 Muscle weakness (generalized): Secondary | ICD-10-CM | POA: Diagnosis not present

## 2015-10-15 NOTE — Therapy (Signed)
[facility] [address], [address] [place], [place], [address] Phone: [phone]   Fax:  [phone]  Physical Therapy Treatment  Patient Details  Name: Anita Arroyo MRN: [id number] Date of Birth: [birth year] Referring Provider: Mckinley Jewel, MD  Encounter Date: 10/15/2015      PT End of Session - 10/15/15 1105    Visit Number 2   Number of Visits 10   Date for PT Re-Evaluation 12/04/15   PT Start Time 1103   PT Stop Time 1200   PT Time Calculation (min) 57 min   Activity Tolerance Patient tolerated treatment well   Behavior During Therapy [facility] for tasks assessed/performed      Past Medical History:  Diagnosis Date  . ADHD (attention deficit hyperactivity disorder)   . Arthritis   . Asthma   . Bipolar 1 disorder ([facility])   . Bipolar 1 disorder ([facility])   . Breast cancer ([facility])   . Depression   . Dysrhythmia    palpitations  . Dysrhythmia    atrial fibrillation  . Fibromyalgia   . GERD (gastroesophageal reflux disease)   . Heart murmur   . History of stress test 04/28/2011   showed normal perfusion without scar or ischemia  . Hx of echocardiogram 04/28/2011   showed normal systolic function with mild diastolic dysfunction,she had trace MR but did not have frank mitral valve prolapse demonstrated. She had mild pulmonary hypertension with an estimated RV systolic pressure at 34 mm.  . Hyperlipemia   . Meniere disease   . MVP (mitral valve prolapse)   . Neuromuscular disorder ([facility])    fibromyalgia    Past Surgical History:  Procedure Laterality Date  . ANKLE SURGERY     left x4  . BREAST SURGERY  2011   lumpectomy right breast   . COLONOSCOPY WITH PROPOFOL N/A 02/25/2014   Procedure: COLONOSCOPY WITH PROPOFOL;  Surgeon: Charolett Bumpers, MD;  [facility]: [facility];  Service: Endoscopy;  Laterality: N/A;  . deviated septum    . ECTOPIC PREGNANCY SURGERY    . ELBOW SURGERY     right elbow  . EYE SURGERY      cataract surgery  . fibroid     2-3 fibroid adenomas removed  . JOINT REPLACEMENT  2011   right knee   . KNEE SURGERY     left knee  . SHOULDER ARTHROSCOPY WITH BICEPSTENOTOMY Right 09/25/2015   Procedure: SHOULDER ARTHROSCOPY WITH BICEPSTENOTOMY;  Surgeon: Loreta Ave, MD;  Location: [facility];  Service: Orthopedics;  Laterality: Right;  . SHOULDER ARTHROSCOPY WITH DISTAL CLAVICLE RESECTION Right 09/25/2015   Procedure: SHOULDER ARTHROSCOPY WITH DISTAL CLAVICLE RESECTION;  Surgeon: Loreta Ave, MD;  Location: [facility];  Service: Orthopedics;  Laterality: Right;  . SHOULDER ARTHROSCOPY WITH SUBACROMIAL DECOMPRESSION Right 09/25/2015   Procedure: RIGHT SHOULDER ARTHROSCOPY DEBRIDEMENT,ACROMIOPLASTY,DISTAL CLAVICAL EXCISION, RELEASE OF BICEPS TENDON;  Surgeon: Loreta Ave, MD;  Location: [facility];  Service: Orthopedics;  Laterality: Right;  . TMJ ARTHROPLASTY    . TONSILLECTOMY    . UTERINE FIBROID SURGERY     polups and fibroids removed   . WRIST SURGERY     left    There were no vitals filed for this visit.      Subjective Assessment - 10/15/15 1106    Subjective Pt was sick the other day, feeling better today.    Currently in Pain? Yes   Pain Score 3  Pain Location Shoulder   Pain Orientation Right   Pain Descriptors / Indicators Spasm   Aggravating Factors  Use   Pain Relieving Factors Muscle relaxer, pain meds, rest   Multiple Pain Sites No                         [facility] Adult PT Treatment/Exercise - 10/15/15 0001      Shoulder Exercises: Seated   Other Seated Exercises Circles 10x     Shoulder Exercises: Standing   Flexion --  Finger ladder 10x, VC/TC to relax upper trap   Row Strengthening;Both;10 reps;Theraband   Theraband Level (Shoulder Row) Level 1 (Yellow)     Shoulder Exercises: Pulleys   Flexion 3 minutes   ABduction 3 minutes     Shoulder Exercises: Isometric Strengthening    ADduction --  10x 3 sec holds, VC to gently depress scapula     Shoulder Exercises: Stretch   Other Shoulder Stretches Lateral neck stretches 2x 15 sec     Vasopneumatic   Number Minutes Vasopneumatic  15 minutes   Vasopnuematic Location  --  RT shoulder   Vasopneumatic Pressure High   Vasopneumatic Temperature  3 flakes     Manual Therapy   Manual Therapy --  Myofascial release RT lateral neck/chest wall.                 PT Education - 10/15/15 1130    Education provided Yes   Education Details yellow band standing rows, anteroir shoulder /chest MFR, neck sidebend stretch   Person(s) Educated Patient   Methods Explanation;Demonstration;Tactile cues;Verbal cues   Comprehension Verbalized understanding;Returned demonstration          PT Short Term Goals - 10/09/15 1230      PT SHORT TERM GOAL #1   Title be independent in initial HEP   Time 4   Period Weeks   Status New     PT SHORT TERM GOAL #2   Title demonstrate Rt shoulder AROM flexion to > or = to 110 degrees to improve reacing overhead   Time 4   Period Weeks   Status New     PT SHORT TERM GOAL #3   Title report a 30% reduction in Rt shoulder pain with use of Rt UE with self-care and ADLs   Time 4   Period Weeks   Status New     PT SHORT TERM GOAL #4   Title demonstrate 4-/5 Rt shoulder strength throughout to improve endurance with use   Time 4   Period Weeks   Status New           PT Long Term Goals - 10/09/15 1146      PT LONG TERM GOAL #1   Title be independent in advanced HEP   Time 4   Period Weeks   Status New     PT LONG TERM GOAL #2   Title reduce FOTO to < or = to 36% limitation   Time 8   Period Weeks   Status New     PT LONG TERM GOAL #3   Title demonstrate Rt shoulder AROM IR to L2 or higher to assist with self-care   Time 8   Period Weeks   Status New     PT LONG TERM GOAL #4   Title report a 60% reduction in Rt shoulder pain with use   Time 8   Period Weeks    Status New  PT LONG TERM GOAL #5   Title demonstrate > or = to 4 to 4+/5 Rt shoulder strength to improve endurance with use of Rt UE   Time 8   Period Weeks   Status New               Plan - 10/15/15 1105    Clinical Impression Statement Pt performed A/ROm exercises very well, minimal verbal cues for the upper trap relaxtion. Eccentric movements most painful most likely due to her weakness. Pt was instructed in self massage for her RT lateral neck muscles and anterior chest wall.    Rehab Potential Good   PT Frequency 2x / week   PT Duration 8 weeks   PT Treatment/Interventions ADLs/Self Care Home Management;Electrical Stimulation;Cryotherapy;Functional mobility training;Moist Heat;Therapeutic activities;Therapeutic exercise;Neuromuscular re-education;Patient/family education;Passive range of motion;Scar mobilization;Manual techniques;Vasopneumatic Device;Taping   Consulted and Agree with Plan of Care Patient      Patient will benefit from skilled therapeutic intervention in order to improve the following deficits and impairments:  Decreased range of motion, Pain, Increased muscle spasms, Decreased endurance, Decreased activity tolerance, Postural dysfunction, Increased edema, Decreased strength, Impaired flexibility  Visit Diagnosis: Pain in right shoulder  Stiffness of right shoulder, not elsewhere classified  Muscle weakness (generalized)     Problem List Patient Active Problem List   Diagnosis Date Noted  . History of palpitations 09/16/2015  . Pre-operative cardiovascular examination 09/16/2015  . Bipolar 1 disorder ([facility]) 09/16/2015  . Palpitations 08/28/2014  . Hyperlipidemia LDL goal <70 11/26/2013  . History of breast cancer 11/16/2012  . Fibromyalgia 11/16/2012  . History of tobacco abuse 11/16/2012  . MRSA 04/13/2010  . INTERTRIGO 04/13/2010  . GROUP B STREPTOCOCCUS INFECTION, HX OF 04/13/2010  . HYPERLIPIDEMIA 09/02/2006  . DEPRESSION 09/02/2006   . Essential hypertension 09/02/2006  . COPD 09/02/2006  . GERD 09/02/2006    Anita Arroyo 10/15/2015, 11:46 AM  [facility] [address], [address] [place], [place], [address] Phone: [phone]   Fax:  [phone]  Name: Anita Arroyo MRN: [id number] Date of Birth: 10/10/1960

## 2015-10-20 ENCOUNTER — Encounter: Payer: Self-pay | Admitting: Physical Therapy

## 2015-10-20 ENCOUNTER — Ambulatory Visit: Payer: Commercial Managed Care - HMO | Admitting: Physical Therapy

## 2015-10-20 DIAGNOSIS — M25511 Pain in right shoulder: Secondary | ICD-10-CM

## 2015-10-20 DIAGNOSIS — M6281 Muscle weakness (generalized): Secondary | ICD-10-CM | POA: Diagnosis not present

## 2015-10-20 DIAGNOSIS — M25611 Stiffness of right shoulder, not elsewhere classified: Secondary | ICD-10-CM | POA: Diagnosis not present

## 2015-10-20 NOTE — Therapy (Signed)
[facility] [address], [address] [place], [place], [address] Phone: [phone]   Fax:  [phone]  Physical Therapy Treatment  Patient Details  Name: Anita Arroyo MRN: [id number] Date of Birth: 06-07-1960 Referring Provider: Mckinley Jewel, MD  Encounter Date: 10/20/2015      PT End of Session - 10/20/15 1104    Visit Number 3   Number of Visits 10   Date for PT Re-Evaluation 12/04/15   PT Start Time 1103   PT Stop Time 1156   PT Time Calculation (min) 53 min   Activity Tolerance Patient tolerated treatment well   Behavior During Therapy [facility] for tasks assessed/performed      Past Medical History:  Diagnosis Date  . ADHD (attention deficit hyperactivity disorder)   . Arthritis   . Asthma   . Bipolar 1 disorder (HCC)   . Bipolar 1 disorder (HCC)   . Breast cancer (HCC)   . Depression   . Dysrhythmia    palpitations  . Dysrhythmia    atrial fibrillation  . Fibromyalgia   . GERD (gastroesophageal reflux disease)   . Heart murmur   . History of stress test 04/28/2011   showed normal perfusion without scar or ischemia  . Hx of echocardiogram 04/28/2011   showed normal systolic function with mild diastolic dysfunction,she had trace MR but did not have frank mitral valve prolapse demonstrated. She had mild pulmonary hypertension with an estimated RV systolic pressure at 34 mm.  . Hyperlipemia   . Meniere disease   . MVP (mitral valve prolapse)   . Neuromuscular disorder (HCC)    fibromyalgia    Past Surgical History:  Procedure Laterality Date  . ANKLE SURGERY     left x4  . BREAST SURGERY  2011   lumpectomy right breast   . COLONOSCOPY WITH PROPOFOL N/A 02/25/2014   Procedure: COLONOSCOPY WITH PROPOFOL;  Surgeon: Charolett Bumpers, MD;  Location: [facility];  Service: Endoscopy;  Laterality: N/A;  . deviated septum    . ECTOPIC PREGNANCY SURGERY    . ELBOW SURGERY     right elbow  . EYE SURGERY     cataract surgery  . fibroid     2-3 fibroid adenomas removed  . JOINT REPLACEMENT  2011   right knee   . KNEE SURGERY     left knee  . SHOULDER ARTHROSCOPY WITH BICEPSTENOTOMY Right 09/25/2015   Procedure: SHOULDER ARTHROSCOPY WITH BICEPSTENOTOMY;  Surgeon: Loreta Ave, MD;  Location: [facility];  Service: Orthopedics;  Laterality: Right;  . SHOULDER ARTHROSCOPY WITH DISTAL CLAVICLE RESECTION Right 09/25/2015   Procedure: SHOULDER ARTHROSCOPY WITH DISTAL CLAVICLE RESECTION;  Surgeon: Loreta Ave, MD;  Location: [facility];  Service: Orthopedics;  Laterality: Right;  . SHOULDER ARTHROSCOPY WITH SUBACROMIAL DECOMPRESSION Right 09/25/2015   Procedure: RIGHT SHOULDER ARTHROSCOPY DEBRIDEMENT,ACROMIOPLASTY,DISTAL CLAVICAL EXCISION, RELEASE OF BICEPS TENDON;  Surgeon: Loreta Ave, MD;  Location: [facility];  Service: Orthopedics;  Laterality: Right;  . TMJ ARTHROPLASTY    . TONSILLECTOMY    . UTERINE FIBROID SURGERY     polups and fibroids removed   . WRIST SURGERY     left    There were no vitals filed for this visit.      Subjective Assessment - 10/20/15 1106    Subjective Just returned from vacation at beach: shoulder was sore with driving. Overall pain is less and neck stretch really helped.  Currently in Pain? Yes   Pain Score 2    Pain Location Shoulder   Pain Orientation Right   Pain Descriptors / Indicators Dull;Aching   Aggravating Factors  Use   Pain Relieving Factors Rest and the neck stretches   Multiple Pain Sites No            [facility] PT Assessment - 10/20/15 0001      AROM   Right Shoulder Flexion 132 Degrees   Right Shoulder ABduction 130 Degrees   Right Shoulder Internal Rotation --  reaches bra strap   Right Shoulder External Rotation --  Reaches top of scapula                     [facility] Adult PT Treatment/Exercise - 10/20/15 0001      Shoulder Exercises: Seated   Flexion --  Elbow  bent/punch over head 2 x10 1#, hard to extend elbow     Shoulder Exercises: Standing   Flexion --  Finger ladder 2x5 with pendulums, VC/TC to relax upper trap   Row Strengthening;Both;20 reps;Theraband   Theraband Level (Shoulder Row) Level 1 (Yellow)     Shoulder Exercises: Pulleys   Flexion 3 minutes   ABduction 3 minutes     Shoulder Exercises: Isometric Strengthening   ADduction --  10x 3 sec holds, VC to gently depress scapula     Vasopneumatic   Number Minutes Vasopneumatic  15 minutes   Vasopnuematic Location  --  RT shoulder   Vasopneumatic Pressure High   Vasopneumatic Temperature  3 flakes                  PT Short Term Goals - 10/20/15 1106      PT SHORT TERM GOAL #1   Title be independent in initial HEP   Time 4   Period Weeks   Status Achieved           PT Long Term Goals - 10/09/15 1146      PT LONG TERM GOAL #1   Title be independent in advanced HEP   Time 4   Period Weeks   Status New     PT LONG TERM GOAL #2   Title reduce FOTO to < or = to 36% limitation   Time 8   Period Weeks   Status New     PT LONG TERM GOAL #3   Title demonstrate Rt shoulder AROM IR to L2 or higher to assist with self-care   Time 8   Period Weeks   Status New     PT LONG TERM GOAL #4   Title report a 60% reduction in Rt shoulder pain with use   Time 8   Period Weeks   Status New     PT LONG TERM GOAL #5   Title demonstrate > or = to 4 to 4+/5 Rt shoulder strength to improve endurance with use of Rt UE   Time 8   Period Weeks   Status New               Plan - 10/20/15 1105    Rehab Potential Good   PT Frequency 2x / week   PT Duration 8 weeks   PT Treatment/Interventions ADLs/Self Care Home Management;Electrical Stimulation;Cryotherapy;Functional mobility training;Moist Heat;Therapeutic activities;Therapeutic exercise;Neuromuscular re-education;Patient/family education;Passive range of motion;Scar mobilization;Manual  techniques;Vasopneumatic Device;Taping   Consulted and Agree with Plan of Care Patient      Patient will benefit from skilled therapeutic intervention  in order to improve the following deficits and impairments:  Decreased range of motion, Pain, Increased muscle spasms, Decreased endurance, Decreased activity tolerance, Postural dysfunction, Increased edema, Decreased strength, Impaired flexibility  Visit Diagnosis: Pain in right shoulder  Stiffness of right shoulder, not elsewhere classified  Muscle weakness (generalized)     Problem List Patient Active Problem List   Diagnosis Date Noted  . History of palpitations 09/16/2015  . Pre-operative cardiovascular examination 09/16/2015  . Bipolar 1 disorder (HCC) 09/16/2015  . Palpitations 08/28/2014  . Hyperlipidemia LDL goal <70 11/26/2013  . History of breast cancer 11/16/2012  . Fibromyalgia 11/16/2012  . History of tobacco abuse 11/16/2012  . MRSA 04/13/2010  . INTERTRIGO 04/13/2010  . GROUP B STREPTOCOCCUS INFECTION, HX OF 04/13/2010  . HYPERLIPIDEMIA 09/02/2006  . DEPRESSION 09/02/2006  . Essential hypertension 09/02/2006  . COPD 09/02/2006  . GERD 09/02/2006    Tyaira Heward 10/20/2015, 11:40 AM  [facility] [address] [place], [place], [address] Phone: [phone]   Fax:  [phone]  Name: Anita Arroyo MRN: [id number] Date of Birth: 09-08-1960

## 2015-10-22 ENCOUNTER — Ambulatory Visit: Payer: Commercial Managed Care - HMO | Admitting: Physical Therapy

## 2015-10-22 ENCOUNTER — Encounter: Payer: Self-pay | Admitting: Physical Therapy

## 2015-10-22 DIAGNOSIS — M6281 Muscle weakness (generalized): Secondary | ICD-10-CM

## 2015-10-22 DIAGNOSIS — M25511 Pain in right shoulder: Secondary | ICD-10-CM | POA: Diagnosis not present

## 2015-10-22 DIAGNOSIS — M25611 Stiffness of right shoulder, not elsewhere classified: Secondary | ICD-10-CM

## 2015-10-22 NOTE — Therapy (Signed)
[facility] [address], [place], [place], [address] Phone: [phone]   Fax:  [phone]  Physical Therapy Treatment  Patient Details  Name: Anita Arroyo MRN: [id number] Date of Birth: [birth year] Referring Provider: Kathryne Hitch, MD  Encounter Date: 10/22/2015      PT End of Session - 10/22/15 1147    Visit Number 4   Number of Visits 10   Date for PT Re-Evaluation 12/04/15   PT Start Time [id number]   PT Stop Time 1245   PT Time Calculation (min) 62 min   Activity Tolerance Patient tolerated treatment well   Behavior During Therapy [facility] for tasks assessed/performed      Past Medical History:  Diagnosis Date  . ADHD (attention deficit hyperactivity disorder)   . Arthritis   . Asthma   . Bipolar 1 disorder ([place])   . Bipolar 1 disorder ([place])   . Breast cancer ([place])   . Depression   . Dysrhythmia    palpitations  . Dysrhythmia    atrial fibrillation  . Fibromyalgia   . GERD (gastroesophageal reflux disease)   . Heart murmur   . History of stress test 04/28/2011   showed normal perfusion without scar or ischemia  . Hx of echocardiogram 04/28/2011   showed normal systolic function with mild diastolic dysfunction,she had trace MR but did not have frank mitral valve prolapse demonstrated. She had mild pulmonary hypertension with an estimated RV systolic pressure at 34 mm.  . Hyperlipemia   . Meniere disease   . MVP (mitral valve prolapse)   . Neuromuscular disorder ([place])    fibromyalgia    Past Surgical History:  Procedure Laterality Date  . ANKLE SURGERY     left x4  . BREAST SURGERY  2011   lumpectomy right breast   . COLONOSCOPY WITH PROPOFOL N/A 02/25/2014   Procedure: COLONOSCOPY WITH PROPOFOL;  Surgeon: Garlan Fair, MD;  Location: [facility];  Service: Endoscopy;  Laterality: N/A;  . deviated septum    . ECTOPIC PREGNANCY SURGERY    . ELBOW SURGERY     right elbow  . EYE SURGERY     cataract surgery  . fibroid     2-3 fibroid adenomas removed  . JOINT REPLACEMENT  2011   right knee   . KNEE SURGERY     left knee  . SHOULDER ARTHROSCOPY WITH BICEPSTENOTOMY Right 09/25/2015   Procedure: SHOULDER ARTHROSCOPY WITH BICEPSTENOTOMY;  Surgeon: Ninetta Lights, MD;  Location: [place];  Service: Orthopedics;  Laterality: Right;  . SHOULDER ARTHROSCOPY WITH DISTAL CLAVICLE RESECTION Right 09/25/2015   Procedure: SHOULDER ARTHROSCOPY WITH DISTAL CLAVICLE RESECTION;  Surgeon: Ninetta Lights, MD;  Location: [place];  Service: Orthopedics;  Laterality: Right;  . SHOULDER ARTHROSCOPY WITH SUBACROMIAL DECOMPRESSION Right 09/25/2015   Procedure: RIGHT SHOULDER ARTHROSCOPY DEBRIDEMENT,ACROMIOPLASTY,DISTAL CLAVICAL EXCISION, RELEASE OF BICEPS TENDON;  Surgeon: Ninetta Lights, MD;  Location: [facility];  Service: Orthopedics;  Laterality: Right;  . TMJ ARTHROPLASTY    . TONSILLECTOMY    . UTERINE FIBROID SURGERY     polups and fibroids removed   . WRIST SURGERY     left    There were no vitals filed for this visit.      Subjective Assessment - 10/22/15 1146    Subjective Put dishes away and have had some increased pain.    Currently in Pain? Yes   Pain Score 5  Device;Taping   PT Next Visit Plan AROM, gentle strength to RT shoulder, modalities for edema and pain. Increase wt for biceps and time on arm bike. D/C arm bike due to hx of breast cancer.   Consulted and Agree with Plan of Care Patient      Patient will benefit from skilled therapeutic intervention in order to improve the following deficits and impairments:  Decreased range of motion, Pain, Increased muscle spasms, Decreased endurance, Decreased activity tolerance, Postural dysfunction, Increased edema, Decreased strength, Impaired flexibility  Visit Diagnosis: Stiffness of right shoulder, not elsewhere classified  Muscle weakness (generalized)  Pain in right shoulder     Problem List Patient Active Problem List   Diagnosis Date Noted  . History of palpitations 09/16/2015  . Pre-operative cardiovascular examination 09/16/2015  . Bipolar 1 disorder ([place]) 09/16/2015  . Palpitations 08/28/2014  . Hyperlipidemia LDL goal <70 11/26/2013  . History of breast cancer 11/16/2012  . Fibromyalgia 11/16/2012  . History of tobacco abuse 11/16/2012  . MRSA 04/13/2010  . INTERTRIGO 04/13/2010  . GROUP B STREPTOCOCCUS INFECTION, HX OF 04/13/2010  . HYPERLIPIDEMIA 09/02/2006  . DEPRESSION 09/02/2006  . Essential hypertension 09/02/2006  . COPD 09/02/2006  . GERD 09/02/2006    Jaidy Cottam, PTA 10/22/2015, 12:20 PM  [facility] [address], [place], [place], [address] Phone: [phone]   Fax:  [phone]  Name: Anita Arroyo MRN: [id number] Date of Birth: 1960-03-25  [facility] [address], [place], [place], [address] Phone: [phone]   Fax:  [phone]  Physical Therapy Treatment  Patient Details  Name: Anita Arroyo MRN: [id number] Date of Birth: [birth year] Referring Provider: Kathryne Hitch, MD  Encounter Date: 10/22/2015      PT End of Session - 10/22/15 1147    Visit Number 4   Number of Visits 10   Date for PT Re-Evaluation 12/04/15   PT Start Time [id number]   PT Stop Time 1245   PT Time Calculation (min) 62 min   Activity Tolerance Patient tolerated treatment well   Behavior During Therapy [facility] for tasks assessed/performed      Past Medical History:  Diagnosis Date  . ADHD (attention deficit hyperactivity disorder)   . Arthritis   . Asthma   . Bipolar 1 disorder ([place])   . Bipolar 1 disorder ([place])   . Breast cancer ([place])   . Depression   . Dysrhythmia    palpitations  . Dysrhythmia    atrial fibrillation  . Fibromyalgia   . GERD (gastroesophageal reflux disease)   . Heart murmur   . History of stress test 04/28/2011   showed normal perfusion without scar or ischemia  . Hx of echocardiogram 04/28/2011   showed normal systolic function with mild diastolic dysfunction,she had trace MR but did not have frank mitral valve prolapse demonstrated. She had mild pulmonary hypertension with an estimated RV systolic pressure at 34 mm.  . Hyperlipemia   . Meniere disease   . MVP (mitral valve prolapse)   . Neuromuscular disorder ([place])    fibromyalgia    Past Surgical History:  Procedure Laterality Date  . ANKLE SURGERY     left x4  . BREAST SURGERY  2011   lumpectomy right breast   . COLONOSCOPY WITH PROPOFOL N/A 02/25/2014   Procedure: COLONOSCOPY WITH PROPOFOL;  Surgeon: Garlan Fair, MD;  Location: [facility];  Service: Endoscopy;  Laterality: N/A;  . deviated septum    . ECTOPIC PREGNANCY SURGERY    . ELBOW SURGERY     right elbow  . EYE SURGERY     cataract surgery  . fibroid     2-3 fibroid adenomas removed  . JOINT REPLACEMENT  2011   right knee   . KNEE SURGERY     left knee  . SHOULDER ARTHROSCOPY WITH BICEPSTENOTOMY Right 09/25/2015   Procedure: SHOULDER ARTHROSCOPY WITH BICEPSTENOTOMY;  Surgeon: Ninetta Lights, MD;  Location: [place];  Service: Orthopedics;  Laterality: Right;  . SHOULDER ARTHROSCOPY WITH DISTAL CLAVICLE RESECTION Right 09/25/2015   Procedure: SHOULDER ARTHROSCOPY WITH DISTAL CLAVICLE RESECTION;  Surgeon: Ninetta Lights, MD;  Location: [facility];  Service: Orthopedics;  Laterality: Right;  . SHOULDER ARTHROSCOPY WITH SUBACROMIAL DECOMPRESSION Right 09/25/2015   Procedure: RIGHT SHOULDER ARTHROSCOPY DEBRIDEMENT,ACROMIOPLASTY,DISTAL CLAVICAL EXCISION, RELEASE OF BICEPS TENDON;  Surgeon: Ninetta Lights, MD;  Location: [facility];  Service: Orthopedics;  Laterality: Right;  . TMJ ARTHROPLASTY    . TONSILLECTOMY    . UTERINE FIBROID SURGERY     polups and fibroids removed   . WRIST SURGERY     left    There were no vitals filed for this visit.      Subjective Assessment - 10/22/15 1146    Subjective Put dishes away and have had some increased pain.    Currently in Pain? Yes   Pain Score 5

## 2015-10-27 ENCOUNTER — Encounter: Payer: Self-pay | Admitting: Physical Therapy

## 2015-10-27 ENCOUNTER — Ambulatory Visit: Payer: Commercial Managed Care - HMO | Attending: Orthopedic Surgery | Admitting: Physical Therapy

## 2015-10-27 DIAGNOSIS — M25511 Pain in right shoulder: Secondary | ICD-10-CM | POA: Diagnosis not present

## 2015-10-27 DIAGNOSIS — G8929 Other chronic pain: Secondary | ICD-10-CM | POA: Insufficient documentation

## 2015-10-27 DIAGNOSIS — M6281 Muscle weakness (generalized): Secondary | ICD-10-CM | POA: Diagnosis not present

## 2015-10-27 DIAGNOSIS — M25611 Stiffness of right shoulder, not elsewhere classified: Secondary | ICD-10-CM | POA: Diagnosis not present

## 2015-10-27 NOTE — Therapy (Signed)
[facility] [address] [place], [place], [address] Phone: [phone]   Fax:  [phone]  Physical Therapy Treatment  Patient Details  Name: Anita Arroyo MRN: [id number] Date of Birth: 1960-06-18 Referring Provider: Mckinley Jewel, MD  Encounter Date: 10/27/2015      PT End of Session - 10/27/15 1113    Visit Number 5   Number of Visits 10   Date for PT Re-Evaluation 12/04/15   PT Start Time 1055   PT Stop Time 1200   PT Time Calculation (min) 65 min   Activity Tolerance Patient limited by pain   Behavior During Therapy --      Past Medical History:  Diagnosis Date  . ADHD (attention deficit hyperactivity disorder)   . Arthritis   . Asthma   . Bipolar 1 disorder (HCC)   . Bipolar 1 disorder (HCC)   . Breast cancer (HCC)   . Depression   . Dysrhythmia    palpitations  . Dysrhythmia    atrial fibrillation  . Fibromyalgia   . GERD (gastroesophageal reflux disease)   . Heart murmur   . History of stress test 04/28/2011   showed normal perfusion without scar or ischemia  . Hx of echocardiogram 04/28/2011   showed normal systolic function with mild diastolic dysfunction,she had trace MR but did not have frank mitral valve prolapse demonstrated. She had mild pulmonary hypertension with an estimated RV systolic pressure at 34 mm.  . Hyperlipemia   . Meniere disease   . MVP (mitral valve prolapse)   . Neuromuscular disorder (HCC)    fibromyalgia    Past Surgical History:  Procedure Laterality Date  . ANKLE SURGERY     left x4  . BREAST SURGERY  2011   lumpectomy right breast   . COLONOSCOPY WITH PROPOFOL N/A 02/25/2014   Procedure: COLONOSCOPY WITH PROPOFOL;  Surgeon: Charolett Bumpers, MD;  [facility];  Service: Endoscopy;  Laterality: N/A;  . deviated septum    . ECTOPIC PREGNANCY SURGERY    . ELBOW SURGERY     right elbow  . EYE SURGERY     cataract surgery  . fibroid     2-3  fibroid adenomas removed  . JOINT REPLACEMENT  2011   right knee   . KNEE SURGERY     left knee  . SHOULDER ARTHROSCOPY WITH BICEPSTENOTOMY Right 09/25/2015   Procedure: SHOULDER ARTHROSCOPY WITH BICEPSTENOTOMY;  Surgeon: Loreta Ave, MD;  [facility];  Service: Orthopedics;  Laterality: Right;  . SHOULDER ARTHROSCOPY WITH DISTAL CLAVICLE RESECTION Right 09/25/2015   Procedure: SHOULDER ARTHROSCOPY WITH DISTAL CLAVICLE RESECTION;  Surgeon: Loreta Ave, MD;  Location: [facility];  Service: Orthopedics;  Laterality: Right;  . SHOULDER ARTHROSCOPY WITH SUBACROMIAL DECOMPRESSION Right 09/25/2015   Procedure: RIGHT SHOULDER ARTHROSCOPY DEBRIDEMENT,ACROMIOPLASTY,DISTAL CLAVICAL EXCISION, RELEASE OF BICEPS TENDON;  Surgeon: Loreta Ave, MD;  Location: [facility];  Service: Orthopedics;  Laterality: Right;  . TMJ ARTHROPLASTY    . TONSILLECTOMY    . UTERINE FIBROID SURGERY     polups and fibroids removed   . WRIST SURGERY     left    There were no vitals filed for this visit.      Subjective Assessment - 10/27/15 1111    Subjective Pt reports a lot of pain after last visit. Unsure why, it just hurts. Is seeing the MD this Friday.    Currently in  Pain? Yes   Pain Score 7    Pain Location Shoulder   Pain Orientation Right   Pain Descriptors / Indicators Sore;Sharp   Aggravating Factors  Increased pain since last visit so pain has been constant   Pain Relieving Factors Rest   Multiple Pain Sites No                                   PT Short Term Goals - 10/27/15 1145      PT SHORT TERM GOAL #2   Title demonstrate Rt shoulder AROM flexion to > or = to 110 degrees to improve reacing overhead   Time 4   Period Weeks   Status On-going     PT SHORT TERM GOAL #3   Title report a 30% reduction in Rt shoulder pain with use of Rt UE with self-care and ADLs   Time 4   Period Weeks   Status On-going      PT SHORT TERM GOAL #4   Title demonstrate 4-/5 Rt shoulder strength throughout to improve endurance with use   Time 4   Period Weeks   Status On-going           PT Long Term Goals - 10/09/15 1146      PT LONG TERM GOAL #1   Title be independent in advanced HEP   Time 4   Period Weeks   Status New     PT LONG TERM GOAL #2   Title reduce FOTO to < or = to 36% limitation   Time 8   Period Weeks   Status New     PT LONG TERM GOAL #3   Title demonstrate Rt shoulder AROM IR to L2 or higher to assist with self-care   Time 8   Period Weeks   Status New     PT LONG TERM GOAL #4   Title report a 60% reduction in Rt shoulder pain with use   Time 8   Period Weeks   Status New     PT LONG TERM GOAL #5   Title demonstrate > or = to 4 to 4+/5 Rt shoulder strength to improve endurance with use of Rt UE   Time 8   Period Weeks   Status New               Plan - 10/27/15 1142    Clinical Impression Statement Pt had increased shoulder pain today. Pt's upper arm; bicep and lateral arm knotted up with trigger points that were very tender. Pain limited exercises and we concentrated mainly on maual work.    Rehab Potential Good   PT Frequency 2x / week   PT Duration 8 weeks   PT Treatment/Interventions ADLs/Self Care Home Management;Electrical Stimulation;Cryotherapy;Functional mobility training;Moist Heat;Therapeutic activities;Therapeutic exercise;Neuromuscular re-education;Patient/family education;Passive range of motion;Scar mobilization;Manual techniques;Vasopneumatic Device;Taping   PT Next Visit Plan Low level strength/isometrics; manual if needed.    Consulted and Agree with Plan of Care Patient      Patient will benefit from skilled therapeutic intervention in order to improve the following deficits and impairments:  Decreased range of motion, Pain, Increased muscle spasms, Decreased endurance, Decreased activity tolerance, Postural dysfunction, Increased edema,  Decreased strength, Impaired flexibility  Visit Diagnosis: Stiffness of right shoulder, not elsewhere classified  Muscle weakness (generalized)  Acute pain of right shoulder     Problem List Patient Active Problem List  Diagnosis Date Noted  . History of palpitations 09/16/2015  . Pre-operative cardiovascular examination 09/16/2015  . Bipolar 1 disorder (HCC) 09/16/2015  . Palpitations 08/28/2014  . Hyperlipidemia LDL goal <70 11/26/2013  . History of breast cancer 11/16/2012  . Fibromyalgia 11/16/2012  . History of tobacco abuse 11/16/2012  . MRSA 04/13/2010  . INTERTRIGO 04/13/2010  . GROUP B STREPTOCOCCUS INFECTION, HX OF 04/13/2010  . HYPERLIPIDEMIA 09/02/2006  . DEPRESSION 09/02/2006  . Essential hypertension 09/02/2006  . COPD 09/02/2006  . GERD 09/02/2006    Amber Williard, PTA 10/27/2015, 11:47 AM  [facility] [address], [address] [place], [place], [address] Phone: [phone]   Fax:  [phone]  Name: CHEYRL GODWIN MRN: [id number] Date of Birth: 07/21/1960

## 2015-10-29 ENCOUNTER — Encounter: Payer: Self-pay | Admitting: Physical Therapy

## 2015-10-29 ENCOUNTER — Ambulatory Visit: Payer: Commercial Managed Care - HMO | Admitting: Physical Therapy

## 2015-10-29 DIAGNOSIS — M25611 Stiffness of right shoulder, not elsewhere classified: Secondary | ICD-10-CM | POA: Diagnosis not present

## 2015-10-29 DIAGNOSIS — M6281 Muscle weakness (generalized): Secondary | ICD-10-CM | POA: Diagnosis not present

## 2015-10-29 DIAGNOSIS — M25511 Pain in right shoulder: Secondary | ICD-10-CM | POA: Diagnosis not present

## 2015-10-29 DIAGNOSIS — G8929 Other chronic pain: Secondary | ICD-10-CM | POA: Diagnosis not present

## 2015-10-29 NOTE — Therapy (Signed)
[facility] [address] [place], [place], [address] Phone: [phone]   Fax:  [phone]  Physical Therapy Treatment  Patient Details  Name: Anita Arroyo MRN: [id number] Date of Birth: [birth year] Referring Provider: Kathryne Hitch, MD  Encounter Date: 10/29/2015      PT End of Session - 10/29/15 1104    Visit Number 6   Number of Visits 10   Date for PT Re-Evaluation 12/04/15   PT Start Time [id number]   PT Stop Time 1200   PT Time Calculation (min) 58 min   Activity Tolerance Patient limited by pain   Behavior During Therapy Agitated;Anxious      Past Medical History:  Diagnosis Date  . ADHD (attention deficit hyperactivity disorder)   . Arthritis   . Asthma   . Bipolar 1 disorder ([place])   . Bipolar 1 disorder ([place])   . Breast cancer ([place])   . Depression   . Dysrhythmia    palpitations  . Dysrhythmia    atrial fibrillation  . Fibromyalgia   . GERD (gastroesophageal reflux disease)   . Heart murmur   . History of stress test 04/28/2011   showed normal perfusion without scar or ischemia  . Hx of echocardiogram 04/28/2011   showed normal systolic function with mild diastolic dysfunction,she had trace MR but did not have frank mitral valve prolapse demonstrated. She had mild pulmonary hypertension with an estimated RV systolic pressure at 34 mm.  . Hyperlipemia   . Meniere disease   . MVP (mitral valve prolapse)   . Neuromuscular disorder ([place])    fibromyalgia    Past Surgical History:  Procedure Laterality Date  . ANKLE SURGERY     left x4  . BREAST SURGERY  2011   lumpectomy right breast   . COLONOSCOPY WITH PROPOFOL N/A 02/25/2014   Procedure: COLONOSCOPY WITH PROPOFOL;  Surgeon: Garlan Fair, MD;  Location: [facility];  Service: Endoscopy;  Laterality: N/A;  . deviated septum    . ECTOPIC PREGNANCY SURGERY    . ELBOW SURGERY     right elbow  . EYE SURGERY     cataract surgery  . fibroid      2-3 fibroid adenomas removed  . JOINT REPLACEMENT  2011   right knee   . KNEE SURGERY     left knee  . SHOULDER ARTHROSCOPY WITH BICEPSTENOTOMY Right 09/25/2015   Procedure: SHOULDER ARTHROSCOPY WITH BICEPSTENOTOMY;  Surgeon: Ninetta Lights, MD;  Location: [place];  Service: Orthopedics;  Laterality: Right;  . SHOULDER ARTHROSCOPY WITH DISTAL CLAVICLE RESECTION Right 09/25/2015   Procedure: SHOULDER ARTHROSCOPY WITH DISTAL CLAVICLE RESECTION;  Surgeon: Ninetta Lights, MD;  Location: [place];  Service: Orthopedics;  Laterality: Right;  . SHOULDER ARTHROSCOPY WITH SUBACROMIAL DECOMPRESSION Right 09/25/2015   Procedure: RIGHT SHOULDER ARTHROSCOPY DEBRIDEMENT,ACROMIOPLASTY,DISTAL CLAVICAL EXCISION, RELEASE OF BICEPS TENDON;  Surgeon: Ninetta Lights, MD;  Location: [facility];  Service: Orthopedics;  Laterality: Right;  . TMJ ARTHROPLASTY    . TONSILLECTOMY    . UTERINE FIBROID SURGERY     polups and fibroids removed   . WRIST SURGERY     left    There were no vitals filed for this visit.      Subjective Assessment - 10/29/15 1103    Subjective Pain seems a little better. Pt has more dilligent in putting ice on her shoulder. Reports being dizzy today, feeling off balance.  Currently in Pain? Yes   Pain Score 6    Pain Location Shoulder   Pain Orientation Right   Pain Descriptors / Indicators Sore;Sharp;Spasm   Multiple Pain Sites No                         [facility] Adult PT Treatment/Exercise - 10/29/15 0001      Shoulder Exercises: Standing   Flexion AAROM;Strengthening;Right;10 reps;Weights  no weight/ there was alos no weight last time     Shoulder Exercises: Pulleys   Flexion 3 minutes   ABduction 3 minutes     Shoulder Exercises: Isometric Strengthening   Flexion 5X10"   Extension 5X10"   ADduction 5X10"     Moist Heat Therapy   Number Minutes Moist Heat 15 Minutes   Moist Heat Location Shoulder      Electrical Stimulation   Electrical Stimulation Location Rt shoulder   Electrical Stimulation Action IFC   Electrical Stimulation Parameters 80-150 HZ   Electrical Stimulation Goals Pain     Manual Therapy   Manual Therapy --  Maunal entry for today and for last treatment; missed entry:   Manual therapy comments Soft tissue to bicep/lateral arm and forearm                   PT Short Term Goals - 10/27/15 1145      PT SHORT TERM GOAL #2   Title demonstrate Rt shoulder AROM flexion to > or = to 110 degrees to improve reacing overhead   Time 4   Period Weeks   Status On-going     PT SHORT TERM GOAL #3   Title report a 30% reduction in Rt shoulder pain with use of Rt UE with self-care and ADLs   Time 4   Period Weeks   Status On-going     PT SHORT TERM GOAL #4   Title demonstrate 4-/5 Rt shoulder strength throughout to improve endurance with use   Time 4   Period Weeks   Status On-going           PT Long Term Goals - 10/09/15 1146      PT LONG TERM GOAL #1   Title be independent in advanced HEP   Time 4   Period Weeks   Status New     PT LONG TERM GOAL #2   Title reduce FOTO to < or = to 36% limitation   Time 8   Period Weeks   Status New     PT LONG TERM GOAL #3   Title demonstrate Rt shoulder AROM IR to L2 or higher to assist with self-care   Time 8   Period Weeks   Status New     PT LONG TERM GOAL #4   Title report a 60% reduction in Rt shoulder pain with use   Time 8   Period Weeks   Status New     PT LONG TERM GOAL #5   Title demonstrate > or = to 4 to 4+/5 Rt shoulder strength to improve endurance with use of Rt UE   Time 8   Period Weeks   Status New               Plan - 10/29/15 1105    Clinical Impression Statement (P)  Some less pain today than last sesison, pt working through some dizziness which made her anxious throughout the treatment. Continue to work on trigger points in  her upper arm. Pt seemed to tolerate  the isometric exercises ok, no reports of increased pain.    Rehab Potential Good   PT Frequency 2x / week   PT Duration 8 weeks   PT Treatment/Interventions ADLs/Self Care Home Management;Electrical Stimulation;Cryotherapy;Functional mobility training;Moist Heat;Therapeutic activities;Therapeutic exercise;Neuromuscular re-education;Patient/family education;Passive range of motion;Scar mobilization;Manual techniques;Vasopneumatic Device;Taping   PT Next Visit Plan (P)  Low level strength/isometrics; manual if needed.    Consulted and Agree with Plan of Care Patient      Patient will benefit from skilled therapeutic intervention in order to improve the following deficits and impairments:  Decreased range of motion, Pain, Increased muscle spasms, Decreased endurance, Decreased activity tolerance, Postural dysfunction, Increased edema, Decreased strength, Impaired flexibility  Visit Diagnosis: Stiffness of right shoulder, not elsewhere classified  Muscle weakness (generalized)  Acute pain of right shoulder  Chronic right shoulder pain     Problem List Patient Active Problem List   Diagnosis Date Noted  . History of palpitations 09/16/2015  . Pre-operative cardiovascular examination 09/16/2015  . Bipolar 1 disorder ([place]) 09/16/2015  . Palpitations 08/28/2014  . Hyperlipidemia LDL goal <70 11/26/2013  . History of breast cancer 11/16/2012  . Fibromyalgia 11/16/2012  . History of tobacco abuse 11/16/2012  . MRSA 04/13/2010  . INTERTRIGO 04/13/2010  . GROUP B STREPTOCOCCUS INFECTION, HX OF 04/13/2010  . HYPERLIPIDEMIA 09/02/2006  . DEPRESSION 09/02/2006  . Essential hypertension 09/02/2006  . COPD 09/02/2006  . GERD 09/02/2006    COCHRAN,JENNIFER 10/29/2015, 11:45 AM  [facility] [address] [place], [place], [address] Phone: [phone]   Fax:  [phone]  Name: Anita Arroyo MRN: [id number] Date of Birth:  1960/05/18

## 2015-10-31 DIAGNOSIS — M7541 Impingement syndrome of right shoulder: Secondary | ICD-10-CM | POA: Diagnosis not present

## 2015-10-31 DIAGNOSIS — M7521 Bicipital tendinitis, right shoulder: Secondary | ICD-10-CM | POA: Diagnosis not present

## 2015-11-03 ENCOUNTER — Ambulatory Visit: Payer: Commercial Managed Care - HMO | Admitting: Physical Therapy

## 2015-11-03 ENCOUNTER — Encounter: Payer: Self-pay | Admitting: Physical Therapy

## 2015-11-03 DIAGNOSIS — M6281 Muscle weakness (generalized): Secondary | ICD-10-CM

## 2015-11-03 DIAGNOSIS — G8929 Other chronic pain: Secondary | ICD-10-CM | POA: Diagnosis not present

## 2015-11-03 DIAGNOSIS — M25611 Stiffness of right shoulder, not elsewhere classified: Secondary | ICD-10-CM | POA: Diagnosis not present

## 2015-11-03 DIAGNOSIS — M25511 Pain in right shoulder: Secondary | ICD-10-CM | POA: Diagnosis not present

## 2015-11-03 NOTE — Therapy (Signed)
ADLs/Self Care Home Management;Electrical Stimulation;Cryotherapy;Functional  mobility training;Moist Heat;Therapeutic activities;Therapeutic exercise;Neuromuscular re-education;Patient/family education;Passive range of motion;Scar mobilization;Manual techniques;Vasopneumatic Device;Taping   PT Next Visit Plan Low level strength/isometrics; manual if needed.    Consulted and Agree with Plan of Care Patient      Patient will benefit from skilled therapeutic intervention in order to improve the following deficits and impairments:  Decreased range of motion, Pain, Increased muscle spasms, Decreased endurance, Decreased activity tolerance, Postural dysfunction, Increased edema, Decreased strength, Impaired flexibility  Visit Diagnosis: Stiffness of right shoulder, not elsewhere classified  Muscle weakness (generalized)     Problem List Patient Active Problem List   Diagnosis Date Noted  . History of palpitations 09/16/2015  . Pre-operative cardiovascular examination 09/16/2015  . Bipolar 1 disorder ([place]) 09/16/2015  . Palpitations 08/28/2014  . Hyperlipidemia LDL goal <70 11/26/2013  . History of breast cancer 11/16/2012  . Fibromyalgia 11/16/2012  . History of tobacco abuse 11/16/2012  . MRSA 04/13/2010  . INTERTRIGO 04/13/2010  . GROUP B STREPTOCOCCUS INFECTION, HX OF 04/13/2010  . HYPERLIPIDEMIA 09/02/2006  . DEPRESSION 09/02/2006  . Essential hypertension 09/02/2006  . COPD 09/02/2006  . GERD 09/02/2006    Jeramyah Goodpasture, PTA 11/03/2015, 11:45 AM   [facility] [address], [place], [place], [address] Phone: [phone]   Fax:  [phone]  Name: Anita YOSHIOKA MRN: [id number] Date of Birth: [birth year]  [facility] [address] [place], [place], [address] Phone: [phone]   Fax:  [phone]  Physical Therapy Treatment  Patient Details  Name: Anita Arroyo MRN: [id number] Date of Birth: [birth year] Referring Provider: Kathryne Hitch, MD  Encounter Date: 11/03/2015      PT End of Session - 11/03/15 1109    Visit Number 7   Number of Visits 10   Date for PT Re-Evaluation 12/04/15   PT Start Time 1100   PT Stop Time 1205   PT Time Calculation (min) 65 min   Activity Tolerance Patient limited by pain   Behavior During Therapy Anxious;Agitated      Past Medical History:  Diagnosis Date  . ADHD (attention deficit hyperactivity disorder)   . Arthritis   . Asthma   . Bipolar 1 disorder ([place])   . Bipolar 1 disorder ([place])   . Breast cancer ([place])   . Depression   . Dysrhythmia    palpitations  . Dysrhythmia    atrial fibrillation  . Fibromyalgia   . GERD (gastroesophageal reflux disease)   . Heart murmur   . History of stress test 04/28/2011   showed normal perfusion without scar or ischemia  . Hx of echocardiogram 04/28/2011   showed normal systolic function with mild diastolic dysfunction,she had trace MR but did not have frank mitral valve prolapse demonstrated. She had mild pulmonary hypertension with an estimated RV systolic pressure at 34 mm.  . Hyperlipemia   . Meniere disease   . MVP (mitral valve prolapse)   . Neuromuscular disorder ([place])    fibromyalgia    Past Surgical History:  Procedure Laterality Date  . ANKLE SURGERY     left x4  . BREAST SURGERY  2011   lumpectomy right breast   . COLONOSCOPY WITH PROPOFOL N/A 02/25/2014   Procedure: COLONOSCOPY WITH PROPOFOL;  Surgeon: Garlan Fair, MD;  [facility]: [facility];  Service: Endoscopy;  Laterality: N/A;  . deviated septum    . ECTOPIC PREGNANCY SURGERY    . ELBOW SURGERY     right elbow  . EYE SURGERY     cataract surgery  . fibroid      2-3 fibroid adenomas removed  . JOINT REPLACEMENT  2011   right knee   . KNEE SURGERY     left knee  . SHOULDER ARTHROSCOPY WITH BICEPSTENOTOMY Right 09/25/2015   Procedure: SHOULDER ARTHROSCOPY WITH BICEPSTENOTOMY;  Surgeon: Ninetta Lights, MD;  Location: [facility];  Service: Orthopedics;  Laterality: Right;  . SHOULDER ARTHROSCOPY WITH DISTAL CLAVICLE RESECTION Right 09/25/2015   Procedure: SHOULDER ARTHROSCOPY WITH DISTAL CLAVICLE RESECTION;  Surgeon: Ninetta Lights, MD;  Location: [facility];  Service: Orthopedics;  Laterality: Right;  . SHOULDER ARTHROSCOPY WITH SUBACROMIAL DECOMPRESSION Right 09/25/2015   Procedure: RIGHT SHOULDER ARTHROSCOPY DEBRIDEMENT,ACROMIOPLASTY,DISTAL CLAVICAL EXCISION, RELEASE OF BICEPS TENDON;  Surgeon: Ninetta Lights, MD;  Location: [facility];  Service: Orthopedics;  Laterality: Right;  . TMJ ARTHROPLASTY    . TONSILLECTOMY    . UTERINE FIBROID SURGERY     polups and fibroids removed   . WRIST SURGERY     left    There were no vitals filed for this visit.      Subjective Assessment - 11/03/15 1107    Subjective Pt back to reporting the pain is back up. She saw the MD on Friday who commented the shoulder progress will go up & down...pt  ADLs/Self Care Home Management;Electrical Stimulation;Cryotherapy;Functional  mobility training;Moist Heat;Therapeutic activities;Therapeutic exercise;Neuromuscular re-education;Patient/family education;Passive range of motion;Scar mobilization;Manual techniques;Vasopneumatic Device;Taping   PT Next Visit Plan Low level strength/isometrics; manual if needed.    Consulted and Agree with Plan of Care Patient      Patient will benefit from skilled therapeutic intervention in order to improve the following deficits and impairments:  Decreased range of motion, Pain, Increased muscle spasms, Decreased endurance, Decreased activity tolerance, Postural dysfunction, Increased edema, Decreased strength, Impaired flexibility  Visit Diagnosis: Stiffness of right shoulder, not elsewhere classified  Muscle weakness (generalized)     Problem List Patient Active Problem List   Diagnosis Date Noted  . History of palpitations 09/16/2015  . Pre-operative cardiovascular examination 09/16/2015  . Bipolar 1 disorder ([place]) 09/16/2015  . Palpitations 08/28/2014  . Hyperlipidemia LDL goal <70 11/26/2013  . History of breast cancer 11/16/2012  . Fibromyalgia 11/16/2012  . History of tobacco abuse 11/16/2012  . MRSA 04/13/2010  . INTERTRIGO 04/13/2010  . GROUP B STREPTOCOCCUS INFECTION, HX OF 04/13/2010  . HYPERLIPIDEMIA 09/02/2006  . DEPRESSION 09/02/2006  . Essential hypertension 09/02/2006  . COPD 09/02/2006  . GERD 09/02/2006    Jeramyah Goodpasture, PTA 11/03/2015, 11:45 AM   [facility] [address], [place], [place], [address] Phone: [phone]   Fax:  [phone]  Name: Anita YOSHIOKA MRN: [id number] Date of Birth: [birth year]

## 2015-11-04 ENCOUNTER — Ambulatory Visit: Payer: Commercial Managed Care - HMO | Admitting: Cardiovascular Disease

## 2015-11-05 ENCOUNTER — Ambulatory Visit: Payer: Commercial Managed Care - HMO | Admitting: Physical Therapy

## 2015-11-05 ENCOUNTER — Encounter: Payer: Self-pay | Admitting: Physical Therapy

## 2015-11-05 DIAGNOSIS — M25611 Stiffness of right shoulder, not elsewhere classified: Secondary | ICD-10-CM | POA: Diagnosis not present

## 2015-11-05 DIAGNOSIS — M25511 Pain in right shoulder: Secondary | ICD-10-CM

## 2015-11-05 DIAGNOSIS — M6281 Muscle weakness (generalized): Secondary | ICD-10-CM | POA: Diagnosis not present

## 2015-11-05 DIAGNOSIS — G8929 Other chronic pain: Secondary | ICD-10-CM | POA: Diagnosis not present

## 2015-11-05 NOTE — Therapy (Signed)
[facility] [address], [address] [place], [place], [address] Phone: [phone]   Fax:  [phone]  Physical Therapy Treatment  Patient Details  Name: Anita Arroyo MRN: [id number] Date of Birth: 1960-06-22 Referring Provider: Mckinley Jewel, MD  Encounter Date: 11/05/2015      PT End of Session - 11/05/15 1059    Visit Number 8   Number of Visits 10   Date for PT Re-Evaluation 12/04/15   PT Start Time 1058   PT Stop Time 1200   PT Time Calculation (min) 62 min   Activity Tolerance Patient tolerated treatment well   Behavior During Therapy [facility] for tasks assessed/performed      Past Medical History:  Diagnosis Date  . ADHD (attention deficit hyperactivity disorder)   . Arthritis   . Asthma   . Bipolar 1 disorder ([facility])   . Bipolar 1 disorder ([facility])   . Breast cancer ([facility])   . Depression   . Dysrhythmia    palpitations  . Dysrhythmia    atrial fibrillation  . Fibromyalgia   . GERD (gastroesophageal reflux disease)   . Heart murmur   . History of stress test 04/28/2011   showed normal perfusion without scar or ischemia  . Hx of echocardiogram 04/28/2011   showed normal systolic function with mild diastolic dysfunction,she had trace MR but did not have frank mitral valve prolapse demonstrated. She had mild pulmonary hypertension with an estimated RV systolic pressure at 34 mm.  . Hyperlipemia   . Meniere disease   . MVP (mitral valve prolapse)   . Neuromuscular disorder ([facility])    fibromyalgia    Past Surgical History:  Procedure Laterality Date  . ANKLE SURGERY     left x4  . BREAST SURGERY  2011   lumpectomy right breast   . COLONOSCOPY WITH PROPOFOL N/A 02/25/2014   Procedure: COLONOSCOPY WITH PROPOFOL;  Surgeon: Charolett Bumpers, MD;  [facility]: [facility];  Service: Endoscopy;  Laterality: N/A;  . deviated septum    . ECTOPIC PREGNANCY SURGERY    . ELBOW SURGERY     right elbow  . EYE SURGERY     cataract surgery  . fibroid     2-3 fibroid adenomas removed  . JOINT REPLACEMENT  2011   right knee   . KNEE SURGERY     left knee  . SHOULDER ARTHROSCOPY WITH BICEPSTENOTOMY Right 09/25/2015   Procedure: SHOULDER ARTHROSCOPY WITH BICEPSTENOTOMY;  Surgeon: Loreta Ave, MD;  Location: [facility];  Service: Orthopedics;  Laterality: Right;  . SHOULDER ARTHROSCOPY WITH DISTAL CLAVICLE RESECTION Right 09/25/2015   Procedure: SHOULDER ARTHROSCOPY WITH DISTAL CLAVICLE RESECTION;  Surgeon: Loreta Ave, MD;  Location: [facility];  Service: Orthopedics;  Laterality: Right;  . SHOULDER ARTHROSCOPY WITH SUBACROMIAL DECOMPRESSION Right 09/25/2015   Procedure: RIGHT SHOULDER ARTHROSCOPY DEBRIDEMENT,ACROMIOPLASTY,DISTAL CLAVICAL EXCISION, RELEASE OF BICEPS TENDON;  Surgeon: Loreta Ave, MD;  Location: [facility];  Service: Orthopedics;  Laterality: Right;  . TMJ ARTHROPLASTY    . TONSILLECTOMY    . UTERINE FIBROID SURGERY     polups and fibroids removed   . WRIST SURGERY     left    There were no vitals filed for this visit.      Subjective Assessment - 11/05/15 1100    Subjective She is having a better day today, pain was less last night so she slept better.    Currently in Pain? Yes  Pain Score 5    Pain Location Shoulder   Pain Orientation Right   Pain Descriptors / Indicators Sore   Aggravating Factors  Constant   Pain Relieving Factors Ice   Multiple Pain Sites No                         [facility] Adult PT Treatment/Exercise - 11/05/15 0001      Shoulder Exercises: Standing   Flexion AAROM;Left;5 reps     Shoulder Exercises: Pulleys   Flexion 3 minutes   ABduction 3 minutes     Shoulder Exercises: Isometric Strengthening   Flexion 5X10"   Extension 5X10"   ADduction 5X10"     Moist Heat Therapy   Number Minutes Moist Heat 15 Minutes   Moist Heat Location Shoulder     Electrical Stimulation   Electrical  Stimulation Location Rt shoulder   Electrical Stimulation Action IFC   Electrical Stimulation Parameters 80-150 HZ   Electrical Stimulation Goals Pain     Manual Therapy   Manual therapy comments Soft tissue to bicep/lateral arm and forearm                   PT Short Term Goals - 11/03/15 1144      PT SHORT TERM GOAL #2   Title demonstrate Rt shoulder AROM flexion to > or = to 110 degrees to improve reacing overhead   Time 4   Period Weeks   Status On-going     PT SHORT TERM GOAL #3   Title report a 30% reduction in Rt shoulder pain with use of Rt UE with self-care and ADLs   Time 4   Period Weeks   Status On-going     PT SHORT TERM GOAL #4   Time 4   Period Weeks   Status On-going           PT Long Term Goals - 10/09/15 1146      PT LONG TERM GOAL #1   Title be independent in advanced HEP   Time 4   Period Weeks   Status New     PT LONG TERM GOAL #2   Title reduce FOTO to < or = to 36% limitation   Time 8   Period Weeks   Status New     PT LONG TERM GOAL #3   Title demonstrate Rt shoulder AROM IR to L2 or higher to assist with self-care   Time 8   Period Weeks   Status New     PT LONG TERM GOAL #4   Title report a 60% reduction in Rt shoulder pain with use   Time 8   Period Weeks   Status New     PT LONG TERM GOAL #5   Title demonstrate > or = to 4 to 4+/5 Rt shoulder strength to improve endurance with use of Rt UE   Time 8   Period Weeks   Status New               Plan - 11/05/15 1059    Clinical Impression Statement Pt was much less anxious/agitated today with the report that pain was better today. Trigger points were still present but not as dense , most thick tissue was anterior shoulder.    Rehab Potential Good   PT Frequency 2x / week   PT Duration 8 weeks   PT Treatment/Interventions ADLs/Self Care Home Management;Electrical Stimulation;Cryotherapy;Functional mobility training;Moist Heat;Therapeutic  activities;Therapeutic exercise;Neuromuscular re-education;Patient/family education;Passive range of motion;Scar mobilization;Manual techniques;Vasopneumatic Device;Taping   PT Next Visit Plan Low level strength/isometrics; manual if needed.    Consulted and Agree with Plan of Care Patient      Patient will benefit from skilled therapeutic intervention in order to improve the following deficits and impairments:  Decreased range of motion, Pain, Increased muscle spasms, Decreased endurance, Decreased activity tolerance, Postural dysfunction, Increased edema, Decreased strength, Impaired flexibility  Visit Diagnosis: Stiffness of right shoulder, not elsewhere classified  Muscle weakness (generalized)  Acute pain of right shoulder     Problem List Patient Active Problem List   Diagnosis Date Noted  . History of palpitations 09/16/2015  . Pre-operative cardiovascular examination 09/16/2015  . Bipolar 1 disorder ([facility]) 09/16/2015  . Palpitations 08/28/2014  . Hyperlipidemia LDL goal <70 11/26/2013  . History of breast cancer 11/16/2012  . Fibromyalgia 11/16/2012  . History of tobacco abuse 11/16/2012  . MRSA 04/13/2010  . INTERTRIGO 04/13/2010  . GROUP B STREPTOCOCCUS INFECTION, HX OF 04/13/2010  . HYPERLIPIDEMIA 09/02/2006  . DEPRESSION 09/02/2006  . Essential hypertension 09/02/2006  . COPD 09/02/2006  . GERD 09/02/2006    Anita Arroyo, PTA 11/05/2015, 12:18 PM  [facility] [address], [address] [place], [place], [address] Phone: [phone]   Fax:  [phone]  Name: Anita Arroyo MRN: [id number] Date of Birth: 09/07/1960

## 2015-11-10 ENCOUNTER — Ambulatory Visit: Payer: Commercial Managed Care - HMO

## 2015-11-10 DIAGNOSIS — G8929 Other chronic pain: Secondary | ICD-10-CM | POA: Diagnosis not present

## 2015-11-10 DIAGNOSIS — M6281 Muscle weakness (generalized): Secondary | ICD-10-CM | POA: Diagnosis not present

## 2015-11-10 DIAGNOSIS — M25611 Stiffness of right shoulder, not elsewhere classified: Secondary | ICD-10-CM

## 2015-11-10 DIAGNOSIS — M25511 Pain in right shoulder: Secondary | ICD-10-CM

## 2015-11-10 NOTE — Therapy (Signed)
[facility] [address], [place], [place], [address] Phone: [phone]   Fax:  [phone]  Physical Therapy Treatment  Patient Details  Name: Anita Arroyo MRN: [id number] Date of Birth: [birth year] Referring Provider: Kathryne Hitch, MD  Encounter Date: 11/10/2015      PT End of Session - 11/10/15 1129    Visit Number 9   Number of Visits 19   Date for PT Re-Evaluation 12/04/15   PT Start Time 1100   PT Stop Time 1201   PT Time Calculation (min) 61 min   Activity Tolerance Patient tolerated treatment well   Behavior During Therapy [facility] for tasks assessed/performed      Past Medical History:  Diagnosis Date  . ADHD (attention deficit hyperactivity disorder)   . Arthritis   . Asthma   . Bipolar 1 disorder ([place])   . Bipolar 1 disorder ([place])   . Breast cancer ([place])   . Depression   . Dysrhythmia    palpitations  . Dysrhythmia    atrial fibrillation  . Fibromyalgia   . GERD (gastroesophageal reflux disease)   . Heart murmur   . History of stress test 04/28/2011   showed normal perfusion without scar or ischemia  . Hx of echocardiogram 04/28/2011   showed normal systolic function with mild diastolic dysfunction,she had trace MR but did not have frank mitral valve prolapse demonstrated. She had mild pulmonary hypertension with an estimated RV systolic pressure at 34 mm.  . Hyperlipemia   . Meniere disease   . MVP (mitral valve prolapse)   . Neuromuscular disorder ([place])    fibromyalgia    Past Surgical History:  Procedure Laterality Date  . ANKLE SURGERY     left x4  . BREAST SURGERY  2011   lumpectomy right breast   . COLONOSCOPY WITH PROPOFOL N/A 02/25/2014   Procedure: COLONOSCOPY WITH PROPOFOL;  Surgeon: Garlan Fair, MD;  Location: [facility];  Service: Endoscopy;  Laterality: N/A;  . deviated septum    . ECTOPIC PREGNANCY SURGERY    . ELBOW SURGERY     right elbow  . EYE SURGERY      cataract surgery  . fibroid     2-3 fibroid adenomas removed  . JOINT REPLACEMENT  2011   right knee   . KNEE SURGERY     left knee  . SHOULDER ARTHROSCOPY WITH BICEPSTENOTOMY Right 09/25/2015   Procedure: SHOULDER ARTHROSCOPY WITH BICEPSTENOTOMY;  Surgeon: Ninetta Lights, MD;  Location: [place];  Service: Orthopedics;  Laterality: Right;  . SHOULDER ARTHROSCOPY WITH DISTAL CLAVICLE RESECTION Right 09/25/2015   Procedure: SHOULDER ARTHROSCOPY WITH DISTAL CLAVICLE RESECTION;  Surgeon: Ninetta Lights, MD;  Location: [place];  Service: Orthopedics;  Laterality: Right;  . SHOULDER ARTHROSCOPY WITH SUBACROMIAL DECOMPRESSION Right 09/25/2015   Procedure: RIGHT SHOULDER ARTHROSCOPY DEBRIDEMENT,ACROMIOPLASTY,DISTAL CLAVICAL EXCISION, RELEASE OF BICEPS TENDON;  Surgeon: Ninetta Lights, MD;  Location: [place];  Service: Orthopedics;  Laterality: Right;  . TMJ ARTHROPLASTY    . TONSILLECTOMY    . UTERINE FIBROID SURGERY     polups and fibroids removed   . WRIST SURGERY     left    There were no vitals filed for this visit.      Subjective Assessment - 11/10/15 1109    Subjective My Rt shoulder hurts.     Pertinent History Rt shoulder arthroscopy for subacromial decompression and distal clavicle resection 09/25/15, pt  is hard of hearing bilaterally, history of cancer- NO ULTRASOUND   Currently in Pain? Yes   Pain Score 5    Pain Location Shoulder   Pain Orientation Right   Pain Descriptors / Indicators Sore   Pain Type Surgical pain   Pain Onset More than a month ago   Pain Frequency Constant   Aggravating Factors  constant   Pain Relieving Factors ice, nothing            [facility] PT Assessment - 11/10/15 0001      Assessment   Medical Diagnosis s/p acromioplasty Rt, SAD     Observation/Other Assessments   Focus on Therapeutic Outcomes (FOTO)  54% limitation     AROM   Overall AROM  Deficits   Overall AROM Comments Lt  shoulder not tested as it will be replaced 12/2015 and has significant pain   Right Shoulder Flexion 132 Degrees   Right Shoulder Internal Rotation --  to Rt lateral buttock- limited by pain                     [facility] Adult PT Treatment/Exercise - 11/10/15 0001      Shoulder Exercises: Seated   Flexion Strengthening;Right;20 reps   Abduction Strengthening;Right;10 reps   ABduction Limitations scaption, not abduction   Other Seated Exercises biceps curls: 3# 2x10     Shoulder Exercises: Standing   Flexion AAROM;Left;10 reps  using finger ladder     Shoulder Exercises: Pulleys   Flexion 3 minutes   ABduction 3 minutes     Moist Heat Therapy   Number Minutes Moist Heat 15 Minutes   Moist Heat Location Shoulder     Electrical Stimulation   Electrical Stimulation Location Rt shoulder   Electrical Stimulation Action IFC   Electrical Stimulation Parameters 15 minutes   Electrical Stimulation Goals Pain     Manual Therapy   Manual therapy comments Soft tissue to Rt bicep/lateral arm and forearm with trigger point release.  Pt in supine                  PT Short Term Goals - 11/10/15 1111      PT SHORT TERM GOAL #1   Title be independent in initial HEP   Status Achieved     PT SHORT TERM GOAL #2   Title demonstrate Rt shoulder AROM flexion to > or = to 110 degrees to improve reacing overhead     PT SHORT TERM GOAL #3   Title report a 30% reduction in Rt shoulder pain with use of Rt UE with self-care and ADLs   Time 4   Period Weeks   Status On-going           PT Long Term Goals - 11/10/15 1111      PT LONG TERM GOAL #1   Title be independent in advanced HEP   Time 8   Period Weeks   Status On-going     PT LONG TERM GOAL #2   Title reduce FOTO to < or = to 36% limitation   Time 8   Period Weeks   Status On-going     PT LONG TERM GOAL #3   Title demonstrate Rt shoulder AROM IR to L2 or higher to assist with self-care   Time 8    Period Weeks   Status On-going               Plan - 11/10/15 1116  Clinical Impression Statement Pt with 5-6/10 Rt shoulder pain today.  FOTO score is 54% limitation which is not improved from evaluation.  Pt is limited by pain with all motion.  Pt with LBP with standing activity today and standing was limited.  Pt plans to have Lt shoulder replaced in December.  Pt will continue to benefit from skilled PT for Rt shoulder strength, AROM and manual/modalities for pain management.     Rehab Potential Good   PT Frequency 2x / week   PT Duration 8 weeks   PT Treatment/Interventions ADLs/Self Care Home Management;Electrical Stimulation;Cryotherapy;Functional mobility training;Moist Heat;Therapeutic activities;Therapeutic exercise;Neuromuscular re-education;Patient/family education;Passive range of motion;Scar mobilization;Manual techniques;Vasopneumatic Device;Taping   PT Next Visit Plan Low level strength/isometrics; manual if needed, modalities as needed.   Consulted and Agree with Plan of Care Patient      Patient will benefit from skilled therapeutic intervention in order to improve the following deficits and impairments:  Decreased range of motion, Pain, Increased muscle spasms, Decreased endurance, Decreased activity tolerance, Postural dysfunction, Increased edema, Decreased strength, Impaired flexibility  Visit Diagnosis: Stiffness of right shoulder, not elsewhere classified  Muscle weakness (generalized)  Acute pain of right shoulder       G-Codes - 12-03-2015 1115    Functional Assessment Tool Used FOTO: 54% limitation   Functional Limitation Other PT primary   Other PT Primary Current Status [id number]) At least 40 percent but less than 60 percent impaired, limited or restricted   Other PT Primary Goal Status [id number]) At least 20 percent but less than 40 percent impaired, limited or restricted      Problem List Patient Active Problem List   Diagnosis Date Noted  .  History of palpitations 09/16/2015  . Pre-operative cardiovascular examination 09/16/2015  . Bipolar 1 disorder ([place]) 09/16/2015  . Palpitations 08/28/2014  . Hyperlipidemia LDL goal <70 11/26/2013  . History of breast cancer 11/16/2012  . Fibromyalgia 11/16/2012  . History of tobacco abuse 11/16/2012  . MRSA 04/13/2010  . INTERTRIGO 04/13/2010  . GROUP B STREPTOCOCCUS INFECTION, HX OF 04/13/2010  . HYPERLIPIDEMIA 09/02/2006  . DEPRESSION 09/02/2006  . Essential hypertension 09/02/2006  . COPD 09/02/2006  . GERD 09/02/2006     Sigurd Sos, PT Dec 03, 2015 11:46 AM  [facility] [address] [place], [place], [address] Phone: [phone]   Fax:  [phone]  Name: Anita Arroyo MRN: [id number] Date of Birth: 1960-11-22

## 2015-11-12 ENCOUNTER — Ambulatory Visit: Payer: Commercial Managed Care - HMO | Admitting: Physical Therapy

## 2015-11-12 ENCOUNTER — Encounter: Payer: Self-pay | Admitting: Physical Therapy

## 2015-11-12 DIAGNOSIS — G8929 Other chronic pain: Secondary | ICD-10-CM | POA: Diagnosis not present

## 2015-11-12 DIAGNOSIS — M25511 Pain in right shoulder: Secondary | ICD-10-CM

## 2015-11-12 DIAGNOSIS — M6281 Muscle weakness (generalized): Secondary | ICD-10-CM | POA: Diagnosis not present

## 2015-11-12 DIAGNOSIS — M25611 Stiffness of right shoulder, not elsewhere classified: Secondary | ICD-10-CM

## 2015-11-12 NOTE — Therapy (Signed)
[facility] [address] [place], [place], [address] Phone: [phone]   Fax:  [phone]  Physical Therapy Treatment  Patient Details  Name: Anita Arroyo MRN: [id number] Date of Birth: August 22, 1960 Referring Provider: Kathryne Hitch, MD  Encounter Date: 11/12/2015      PT End of Session - 11/12/15 1103    Visit Number 10   Number of Visits 19   Date for PT Re-Evaluation 12/04/15   PT Start Time 1100   PT Stop Time 1155   PT Time Calculation (min) 55 min   Activity Tolerance Patient tolerated treatment well   Behavior During Therapy [facility] for tasks assessed/performed      Past Medical History:  Diagnosis Date  . ADHD (attention deficit hyperactivity disorder)   . Arthritis   . Asthma   . Bipolar 1 disorder ([place])   . Bipolar 1 disorder ([place])   . Breast cancer ([place])   . Depression   . Dysrhythmia    palpitations  . Dysrhythmia    atrial fibrillation  . Fibromyalgia   . GERD (gastroesophageal reflux disease)   . Heart murmur   . History of stress test 04/28/2011   showed normal perfusion without scar or ischemia  . Hx of echocardiogram 04/28/2011   showed normal systolic function with mild diastolic dysfunction,she had trace MR but did not have frank mitral valve prolapse demonstrated. She had mild pulmonary hypertension with an estimated RV systolic pressure at 34 mm.  . Hyperlipemia   . Meniere disease   . MVP (mitral valve prolapse)   . Neuromuscular disorder ([place])    fibromyalgia    Past Surgical History:  Procedure Laterality Date  . ANKLE SURGERY     left x4  . BREAST SURGERY  2011   lumpectomy right breast   . COLONOSCOPY WITH PROPOFOL N/A 02/25/2014   Procedure: COLONOSCOPY WITH PROPOFOL;  Surgeon: Garlan Fair, MD;  [facility];  Service: Endoscopy;  Laterality: N/A;  . deviated septum    . ECTOPIC PREGNANCY SURGERY    . ELBOW SURGERY     right elbow  . EYE SURGERY     cataract surgery  . fibroid     2-3 fibroid adenomas removed  . JOINT REPLACEMENT  2011   right knee   . KNEE SURGERY     left knee  . SHOULDER ARTHROSCOPY WITH BICEPSTENOTOMY Right 09/25/2015   Procedure: SHOULDER ARTHROSCOPY WITH BICEPSTENOTOMY;  Surgeon: Ninetta Lights, MD;  Location: [place];  Service: Orthopedics;  Laterality: Right;  . SHOULDER ARTHROSCOPY WITH DISTAL CLAVICLE RESECTION Right 09/25/2015   Procedure: SHOULDER ARTHROSCOPY WITH DISTAL CLAVICLE RESECTION;  Surgeon: Ninetta Lights, MD;  Location: [place];  Service: Orthopedics;  Laterality: Right;  . SHOULDER ARTHROSCOPY WITH SUBACROMIAL DECOMPRESSION Right 09/25/2015   Procedure: RIGHT SHOULDER ARTHROSCOPY DEBRIDEMENT,ACROMIOPLASTY,DISTAL CLAVICAL EXCISION, RELEASE OF BICEPS TENDON;  Surgeon: Ninetta Lights, MD;  Location: [place];  Service: Orthopedics;  Laterality: Right;  . TMJ ARTHROPLASTY    . TONSILLECTOMY    . UTERINE FIBROID SURGERY     polups and fibroids removed   . WRIST SURGERY     left    There were no vitals filed for this visit.      Subjective Assessment - 11/12/15 1112    Subjective I was very busy yesterday, I was exhausted and hurt. I really used my arm in ways I had not been doing.  Patient will benefit from skilled therapeutic intervention in order to improve the following deficits and impairments:  Decreased range of motion, Pain, Increased muscle spasms, Decreased endurance, Decreased activity tolerance, Postural dysfunction, Increased edema, Decreased strength, Impaired flexibility  Visit Diagnosis: Stiffness of right shoulder, not elsewhere classified  Muscle weakness (generalized)  Acute pain of right shoulder     Problem List Patient Active Problem List   Diagnosis Date Noted  . History of palpitations 09/16/2015  . Pre-operative cardiovascular examination 09/16/2015  . Bipolar 1 disorder ([place]) 09/16/2015  . Palpitations 08/28/2014  . Hyperlipidemia LDL goal <70 11/26/2013  . History of breast cancer 11/16/2012  . Fibromyalgia 11/16/2012  . History of tobacco abuse 11/16/2012  . MRSA 04/13/2010  . INTERTRIGO 04/13/2010  . GROUP B STREPTOCOCCUS INFECTION, HX OF 04/13/2010  . HYPERLIPIDEMIA 09/02/2006  . DEPRESSION 09/02/2006  . Essential hypertension 09/02/2006  . COPD 09/02/2006  . GERD 09/02/2006    Careena Degraffenreid, PTA 11/12/2015, 11:43 AM  [facility] [address] [place], [place], [address] Phone: [phone]   Fax:  [phone]  Name: Anita Arroyo MRN: [id number] Date of Birth: [birth year]  Patient will benefit from skilled therapeutic intervention in order to improve the following deficits and impairments:  Decreased range of motion, Pain, Increased muscle spasms, Decreased endurance, Decreased activity tolerance, Postural dysfunction, Increased edema, Decreased strength, Impaired flexibility  Visit Diagnosis: Stiffness of right shoulder, not elsewhere classified  Muscle weakness (generalized)  Acute pain of right shoulder     Problem List Patient Active Problem List   Diagnosis Date Noted  . History of palpitations 09/16/2015  . Pre-operative cardiovascular examination 09/16/2015  . Bipolar 1 disorder ([place]) 09/16/2015  . Palpitations 08/28/2014  . Hyperlipidemia LDL goal <70 11/26/2013  . History of breast cancer 11/16/2012  . Fibromyalgia 11/16/2012  . History of tobacco abuse 11/16/2012  . MRSA 04/13/2010  . INTERTRIGO 04/13/2010  . GROUP B STREPTOCOCCUS INFECTION, HX OF 04/13/2010  . HYPERLIPIDEMIA 09/02/2006  . DEPRESSION 09/02/2006  . Essential hypertension 09/02/2006  . COPD 09/02/2006  . GERD 09/02/2006    Careena Degraffenreid, PTA 11/12/2015, 11:43 AM  [facility] [address] [place], [place], [address] Phone: [phone]   Fax:  [phone]  Name: Anita Arroyo MRN: [id number] Date of Birth: [birth year]

## 2015-11-14 DIAGNOSIS — F319 Bipolar disorder, unspecified: Secondary | ICD-10-CM | POA: Diagnosis not present

## 2015-11-14 DIAGNOSIS — G894 Chronic pain syndrome: Secondary | ICD-10-CM | POA: Diagnosis not present

## 2015-11-17 ENCOUNTER — Encounter: Payer: Self-pay | Admitting: Physical Therapy

## 2015-11-17 ENCOUNTER — Ambulatory Visit: Payer: Commercial Managed Care - HMO | Admitting: Physical Therapy

## 2015-11-17 DIAGNOSIS — M25511 Pain in right shoulder: Secondary | ICD-10-CM

## 2015-11-17 DIAGNOSIS — M25611 Stiffness of right shoulder, not elsewhere classified: Secondary | ICD-10-CM | POA: Diagnosis not present

## 2015-11-17 DIAGNOSIS — G8929 Other chronic pain: Secondary | ICD-10-CM | POA: Diagnosis not present

## 2015-11-17 DIAGNOSIS — M6281 Muscle weakness (generalized): Secondary | ICD-10-CM

## 2015-11-17 NOTE — Therapy (Signed)
[facility] [address] [place], [place], [address] Phone: [phone]   Fax:  [phone]  Physical Therapy Treatment  Patient Details  Name: Anita Arroyo MRN: [id number] Date of Birth: [birth year] Referring Provider: Kathryne Hitch, MD  Encounter Date: 11/17/2015      PT End of Session - 11/17/15 1104    Visit Number 11   Number of Visits 19   Date for PT Re-Evaluation 12/04/15   PT Start Time 1057   PT Stop Time 1200   PT Time Calculation (min) 63 min   Activity Tolerance Patient tolerated treatment well   Behavior During Therapy [facility] for tasks assessed/performed      Past Medical History:  Diagnosis Date  . ADHD (attention deficit hyperactivity disorder)   . Arthritis   . Asthma   . Bipolar 1 disorder ([place])   . Bipolar 1 disorder ([place])   . Breast cancer ([place])   . Depression   . Dysrhythmia    palpitations  . Dysrhythmia    atrial fibrillation  . Fibromyalgia   . GERD (gastroesophageal reflux disease)   . Heart murmur   . History of stress test 04/28/2011   showed normal perfusion without scar or ischemia  . Hx of echocardiogram 04/28/2011   showed normal systolic function with mild diastolic dysfunction,she had trace MR but did not have frank mitral valve prolapse demonstrated. She had mild pulmonary hypertension with an estimated RV systolic pressure at 34 mm.  . Hyperlipemia   . Meniere disease   . MVP (mitral valve prolapse)   . Neuromuscular disorder ([place])    fibromyalgia    Past Surgical History:  Procedure Laterality Date  . ANKLE SURGERY     left x4  . BREAST SURGERY  2011   lumpectomy right breast   . COLONOSCOPY WITH PROPOFOL N/A 02/25/2014   Procedure: COLONOSCOPY WITH PROPOFOL;  Surgeon: Garlan Fair, MD;  [facility];  Service: Endoscopy;  Laterality: N/A;  . deviated septum    . ECTOPIC PREGNANCY SURGERY    . ELBOW SURGERY     right elbow  . EYE SURGERY     cataract surgery  . fibroid     2-3 fibroid adenomas removed  . JOINT REPLACEMENT  2011   right knee   . KNEE SURGERY     left knee  . SHOULDER ARTHROSCOPY WITH BICEPSTENOTOMY Right 09/25/2015   Procedure: SHOULDER ARTHROSCOPY WITH BICEPSTENOTOMY;  Surgeon: Ninetta Lights, MD;  Location: [place];  Service: Orthopedics;  Laterality: Right;  . SHOULDER ARTHROSCOPY WITH DISTAL CLAVICLE RESECTION Right 09/25/2015   Procedure: SHOULDER ARTHROSCOPY WITH DISTAL CLAVICLE RESECTION;  Surgeon: Ninetta Lights, MD;  Location: [place];  Service: Orthopedics;  Laterality: Right;  . SHOULDER ARTHROSCOPY WITH SUBACROMIAL DECOMPRESSION Right 09/25/2015   Procedure: RIGHT SHOULDER ARTHROSCOPY DEBRIDEMENT,ACROMIOPLASTY,DISTAL CLAVICAL EXCISION, RELEASE OF BICEPS TENDON;  Surgeon: Ninetta Lights, MD;  Location: [place];  Service: Orthopedics;  Laterality: Right;  . TMJ ARTHROPLASTY    . TONSILLECTOMY    . UTERINE FIBROID SURGERY     polups and fibroids removed   . WRIST SURGERY     left    There were no vitals filed for this visit.      Subjective Assessment - 11/17/15 1101    Subjective I was able to sleep in my bed the last few nights. I did pretty good.    Currently in Pain? Yes  Consulted and Agree with Plan of Care Patient      Patient will benefit from skilled therapeutic intervention in order to improve the following deficits and impairments:  Decreased range of motion, Pain, Increased muscle spasms, Decreased endurance, Decreased activity tolerance, Postural dysfunction, Increased edema, Decreased strength, Impaired flexibility  Visit Diagnosis: Stiffness of right shoulder, not elsewhere classified  Muscle weakness (generalized)  Acute pain of right shoulder     Problem List Patient Active Problem List   Diagnosis Date Noted  . History of palpitations 09/16/2015  . Pre-operative cardiovascular examination 09/16/2015  . Bipolar 1 disorder ([place]) 09/16/2015  . Palpitations 08/28/2014  . Hyperlipidemia LDL goal <70 11/26/2013  . History of breast cancer 11/16/2012  . Fibromyalgia 11/16/2012  . History of tobacco abuse 11/16/2012  . MRSA 04/13/2010  . INTERTRIGO 04/13/2010  . GROUP B STREPTOCOCCUS INFECTION, HX OF 04/13/2010  . HYPERLIPIDEMIA 09/02/2006  . DEPRESSION 09/02/2006  . Essential hypertension 09/02/2006  . COPD 09/02/2006  . GERD 09/02/2006    Landyn Buckalew, PTA 11/17/2015, 11:47 AM  [facility] [address] [place], [place], [address] Phone: [phone]   Fax:  [phone]  Name: Anita Arroyo MRN: [id number] Date of Birth: [birth year]  Consulted and Agree with Plan of Care Patient      Patient will benefit from skilled therapeutic intervention in order to improve the following deficits and impairments:  Decreased range of motion, Pain, Increased muscle spasms, Decreased endurance, Decreased activity tolerance, Postural dysfunction, Increased edema, Decreased strength, Impaired flexibility  Visit Diagnosis: Stiffness of right shoulder, not elsewhere classified  Muscle weakness (generalized)  Acute pain of right shoulder     Problem List Patient Active Problem List   Diagnosis Date Noted  . History of palpitations 09/16/2015  . Pre-operative cardiovascular examination 09/16/2015  . Bipolar 1 disorder ([place]) 09/16/2015  . Palpitations 08/28/2014  . Hyperlipidemia LDL goal <70 11/26/2013  . History of breast cancer 11/16/2012  . Fibromyalgia 11/16/2012  . History of tobacco abuse 11/16/2012  . MRSA 04/13/2010  . INTERTRIGO 04/13/2010  . GROUP B STREPTOCOCCUS INFECTION, HX OF 04/13/2010  . HYPERLIPIDEMIA 09/02/2006  . DEPRESSION 09/02/2006  . Essential hypertension 09/02/2006  . COPD 09/02/2006  . GERD 09/02/2006    Landyn Buckalew, PTA 11/17/2015, 11:47 AM  [facility] [address] [place], [place], [address] Phone: [phone]   Fax:  [phone]  Name: Anita Arroyo MRN: [id number] Date of Birth: [birth year]

## 2015-11-19 ENCOUNTER — Ambulatory Visit: Payer: Commercial Managed Care - HMO

## 2015-11-19 DIAGNOSIS — M25511 Pain in right shoulder: Secondary | ICD-10-CM | POA: Diagnosis not present

## 2015-11-19 DIAGNOSIS — M25611 Stiffness of right shoulder, not elsewhere classified: Secondary | ICD-10-CM | POA: Diagnosis not present

## 2015-11-19 DIAGNOSIS — G8929 Other chronic pain: Secondary | ICD-10-CM | POA: Diagnosis not present

## 2015-11-19 DIAGNOSIS — M6281 Muscle weakness (generalized): Secondary | ICD-10-CM

## 2015-11-19 NOTE — Therapy (Signed)
[facility] [address] [place], [place], [address] Phone: [phone]   Fax:  [phone]  Physical Therapy Treatment  Patient Details  Name: Anita Arroyo MRN: [id number] Date of Birth: 10/08/1960 Referring Provider: Kathryne Hitch, MD  Encounter Date: 11/19/2015      PT End of Session - 11/19/15 1221    Visit Number 12   Number of Visits 19   Date for PT Re-Evaluation 12/04/15   PT Start Time 1140   PT Stop Time 1235   PT Time Calculation (min) 55 min   Activity Tolerance Patient tolerated treatment well   Behavior During Therapy [facility] for tasks assessed/performed      Past Medical History:  Diagnosis Date  . ADHD (attention deficit hyperactivity disorder)   . Arthritis   . Asthma   . Bipolar 1 disorder ([place])   . Bipolar 1 disorder ([place])   . Breast cancer ([place])   . Depression   . Dysrhythmia    palpitations  . Dysrhythmia    atrial fibrillation  . Fibromyalgia   . GERD (gastroesophageal reflux disease)   . Heart murmur   . History of stress test 04/28/2011   showed normal perfusion without scar or ischemia  . Hx of echocardiogram 04/28/2011   showed normal systolic function with mild diastolic dysfunction,she had trace MR but did not have frank mitral valve prolapse demonstrated. She had mild pulmonary hypertension with an estimated RV systolic pressure at 34 mm.  . Hyperlipemia   . Meniere disease   . MVP (mitral valve prolapse)   . Neuromuscular disorder ([place])    fibromyalgia    Past Surgical History:  Procedure Laterality Date  . ANKLE SURGERY     left x4  . BREAST SURGERY  2011   lumpectomy right breast   . COLONOSCOPY WITH PROPOFOL N/A 02/25/2014   Procedure: COLONOSCOPY WITH PROPOFOL;  Surgeon: Garlan Fair, MD;  Location: [facility];  Service: Endoscopy;  Laterality: N/A;  . deviated septum    . ECTOPIC PREGNANCY SURGERY    . ELBOW SURGERY     right elbow  . EYE SURGERY     cataract surgery  . fibroid     2-3 fibroid adenomas removed  . JOINT REPLACEMENT  2011   right knee   . KNEE SURGERY     left knee  . SHOULDER ARTHROSCOPY WITH BICEPSTENOTOMY Right 09/25/2015   Procedure: SHOULDER ARTHROSCOPY WITH BICEPSTENOTOMY;  Surgeon: Ninetta Lights, MD;  Location: [place];  Service: Orthopedics;  Laterality: Right;  . SHOULDER ARTHROSCOPY WITH DISTAL CLAVICLE RESECTION Right 09/25/2015   Procedure: SHOULDER ARTHROSCOPY WITH DISTAL CLAVICLE RESECTION;  Surgeon: Ninetta Lights, MD;  Location: [place];  Service: Orthopedics;  Laterality: Right;  . SHOULDER ARTHROSCOPY WITH SUBACROMIAL DECOMPRESSION Right 09/25/2015   Procedure: RIGHT SHOULDER ARTHROSCOPY DEBRIDEMENT,ACROMIOPLASTY,DISTAL CLAVICAL EXCISION, RELEASE OF BICEPS TENDON;  Surgeon: Ninetta Lights, MD;  Location: [place];  Service: Orthopedics;  Laterality: Right;  . TMJ ARTHROPLASTY    . TONSILLECTOMY    . UTERINE FIBROID SURGERY     polups and fibroids removed   . WRIST SURGERY     left    There were no vitals filed for this visit.      Subjective Assessment - 11/19/15 1146    Subjective Feeling OK.     Pertinent History Rt shoulder arthroscopy for subacromial decompression and distal clavicle resection 09/25/15, pt is hard of  hearing bilaterally, history of cancer- NO ULTRASOUND.     Currently in Pain? Yes   Pain Score 4    Pain Location Shoulder   Pain Orientation Right   Pain Descriptors / Indicators Dull;Aching   Pain Type Surgical pain                         [facility] Adult PT Treatment/Exercise - 11/19/15 0001      Shoulder Exercises: Seated   Flexion Strengthening;Both;Weights;20 reps   Flexion Weight (lbs) 1   ABduction Limitations scaption, not abduction  1# 2x10   Other Seated Exercises biceps curls: 4# 2x10     Shoulder Exercises: Standing   Flexion AAROM;Right;10 reps  On finger ladder   Extension  Strengthening;Both;20 reps   Theraband Level (Shoulder Extension) Level 2 (Red)   Row Strengthening;Right;20 reps   Theraband Level (Shoulder Row) Level 2 (Red)     Shoulder Exercises: Pulleys   Flexion 3 minutes   ABduction 3 minutes     Moist Heat Therapy   Number Minutes Moist Heat 15 Minutes   Moist Heat Location Shoulder     Electrical Stimulation   Electrical Stimulation Location Rt shoulder   Electrical Stimulation Action IFC   Electrical Stimulation Parameters 15 minutes   Electrical Stimulation Goals Pain     Manual Therapy   Manual therapy comments Soft tissue to Rt bicep/lateral arm and forearm with trigger point release.  Pt in supine                  PT Short Term Goals - 11/17/15 1104      PT SHORT TERM GOAL #2   Title demonstrate Rt shoulder AROM flexion to > or = to 110 degrees to improve reacing overhead   Time 4   Period Weeks   Status Achieved     PT SHORT TERM GOAL #3   Title report a 30% reduction in Rt shoulder pain with use of Rt UE with self-care and ADLs   Time 4   Period Weeks   Status Achieved  30%     PT SHORT TERM GOAL #4   Title demonstrate 4-/5 Rt shoulder strength throughout to improve endurance with use   Time 4   Period Weeks   Status Achieved           PT Long Term Goals - 11/10/15 1111      PT LONG TERM GOAL #1   Title be independent in advanced HEP   Time 8   Period Weeks   Status On-going     PT LONG TERM GOAL #2   Title reduce FOTO to < or = to 36% limitation   Time 8   Period Weeks   Status On-going     PT LONG TERM GOAL #3   Title demonstrate Rt shoulder AROM IR to L2 or higher to assist with self-care   Time 8   Period Weeks   Status On-going               Plan - 11/19/15 1148    Clinical Impression Statement Pt reports that her Rt arm is "moving in the right direction".  Pt is using the Rt arm more at home and with ADLs/self-care tasks.  Pt able to tolerate incresed reps and weight  with exercise in the clinic.  Pt continues to be limited by pain.  Pt will continue to benefit from skilled PT for strength, A/ROM  progression of Rt shoulder and pain management as needed.     Rehab Potential Good   PT Frequency 2x / week   PT Duration 8 weeks   PT Treatment/Interventions ADLs/Self Care Home Management;Electrical Stimulation;Cryotherapy;Functional mobility training;Moist Heat;Therapeutic activities;Therapeutic exercise;Neuromuscular re-education;Patient/family education;Passive range of motion;Scar mobilization;Manual techniques;Vasopneumatic Device;Taping   PT Next Visit Plan Low level strength and A/ROM; manual if needed, modalities as needed.   Consulted and Agree with Plan of Care Patient      Patient will benefit from skilled therapeutic intervention in order to improve the following deficits and impairments:  Decreased range of motion, Pain, Increased muscle spasms, Decreased endurance, Decreased activity tolerance, Postural dysfunction, Increased edema, Decreased strength, Impaired flexibility  Visit Diagnosis: Stiffness of right shoulder, not elsewhere classified  Muscle weakness (generalized)     Problem List Patient Active Problem List   Diagnosis Date Noted  . History of palpitations 09/16/2015  . Pre-operative cardiovascular examination 09/16/2015  . Bipolar 1 disorder ([place]) 09/16/2015  . Palpitations 08/28/2014  . Hyperlipidemia LDL goal <70 11/26/2013  . History of breast cancer 11/16/2012  . Fibromyalgia 11/16/2012  . History of tobacco abuse 11/16/2012  . MRSA 04/13/2010  . INTERTRIGO 04/13/2010  . GROUP B STREPTOCOCCUS INFECTION, HX OF 04/13/2010  . HYPERLIPIDEMIA 09/02/2006  . DEPRESSION 09/02/2006  . Essential hypertension 09/02/2006  . COPD 09/02/2006  . GERD 09/02/2006     Sigurd Sos, PT 11/19/15 12:23 PM  [facility] [address], [place], [place], [address] Phone:  [phone]   Fax:  [phone]  Name: Anita Arroyo MRN: [id number] Date of Birth: May 09, 1960

## 2015-11-24 ENCOUNTER — Encounter: Payer: Self-pay | Admitting: Physical Therapy

## 2015-11-26 ENCOUNTER — Encounter: Payer: Self-pay | Admitting: Physical Therapy

## 2015-11-26 NOTE — Telephone Encounter (Signed)
Closed encounter °

## 2015-11-27 ENCOUNTER — Ambulatory Visit (HOSPITAL_COMMUNITY): Payer: Self-pay | Admitting: Psychiatry

## 2015-12-01 ENCOUNTER — Encounter: Payer: Self-pay | Admitting: Physical Therapy

## 2015-12-02 ENCOUNTER — Encounter: Payer: Self-pay | Admitting: Physician Assistant

## 2015-12-02 DIAGNOSIS — J209 Acute bronchitis, unspecified: Secondary | ICD-10-CM | POA: Diagnosis not present

## 2015-12-02 DIAGNOSIS — Z79891 Long term (current) use of opiate analgesic: Secondary | ICD-10-CM | POA: Diagnosis not present

## 2015-12-03 ENCOUNTER — Encounter: Payer: Self-pay | Admitting: Physical Therapy

## 2015-12-08 ENCOUNTER — Ambulatory Visit: Payer: Commercial Managed Care - HMO | Attending: Orthopedic Surgery | Admitting: Physical Therapy

## 2015-12-08 ENCOUNTER — Encounter: Payer: Self-pay | Admitting: Physical Therapy

## 2015-12-08 DIAGNOSIS — M25611 Stiffness of right shoulder, not elsewhere classified: Secondary | ICD-10-CM

## 2015-12-08 DIAGNOSIS — M25511 Pain in right shoulder: Secondary | ICD-10-CM | POA: Diagnosis not present

## 2015-12-08 DIAGNOSIS — M6281 Muscle weakness (generalized): Secondary | ICD-10-CM | POA: Diagnosis not present

## 2015-12-08 NOTE — Addendum Note (Signed)
Addended by: Danie Binder on: 12/08/2015 11:22 AM   Modules accepted: Orders

## 2015-12-08 NOTE — Therapy (Addendum)
[facility] [address], [place], [place], [address] Phone: [phone]   Fax:  [phone]  Physical Therapy Treatment  Patient Details  Name: ANIYAH NOBIS MRN: [id number] Date of Birth: 07/02/1960 Referring Provider: Kathryne Hitch MD  Encounter Date: 12/08/2015      PT End of Session - 12/08/15 1025    Visit Number 13   Number of Visits 19   Date for PT Re-Evaluation 12/04/15   PT Start Time 1018   PT Stop Time 1059   PT Time Calculation (min) 41 min   Activity Tolerance Patient tolerated treatment well   Behavior During Therapy [facility] for tasks assessed/performed      Past Medical History:  Diagnosis Date  . ADHD (attention deficit hyperactivity disorder)   . Arthritis   . Asthma   . Bipolar 1 disorder ([place])   . Bipolar 1 disorder ([place])   . Breast cancer ([place])   . Depression   . Dysrhythmia    palpitations  . Dysrhythmia    atrial fibrillation  . Fibromyalgia   . GERD (gastroesophageal reflux disease)   . Heart murmur   . History of stress test 04/28/2011   showed normal perfusion without scar or ischemia  . Hx of echocardiogram 04/28/2011   showed normal systolic function with mild diastolic dysfunction,she had trace MR but did not have frank mitral valve prolapse demonstrated. She had mild pulmonary hypertension with an estimated RV systolic pressure at 34 mm.  . Hyperlipemia   . Meniere disease   . MVP (mitral valve prolapse)   . Neuromuscular disorder ([place])    fibromyalgia    Past Surgical History:  Procedure Laterality Date  . ANKLE SURGERY     left x4  . BREAST SURGERY  2011   lumpectomy right breast   . COLONOSCOPY WITH PROPOFOL N/A 02/25/2014   Procedure: COLONOSCOPY WITH PROPOFOL;  Surgeon: Garlan Fair, MD;  [facility];  Service: Endoscopy;  Laterality: N/A;  . deviated septum    . ECTOPIC PREGNANCY SURGERY    . ELBOW SURGERY     right elbow  . EYE SURGERY     cataract surgery  . fibroid     2-3 fibroid adenomas removed  . JOINT REPLACEMENT  2011   right knee   . KNEE SURGERY     left knee  . SHOULDER ARTHROSCOPY WITH BICEPSTENOTOMY Right 09/25/2015   Procedure: SHOULDER ARTHROSCOPY WITH BICEPSTENOTOMY;  Surgeon: Ninetta Lights, MD;  Location: [place];  Service: Orthopedics;  Laterality: Right;  . SHOULDER ARTHROSCOPY WITH DISTAL CLAVICLE RESECTION Right 09/25/2015   Procedure: SHOULDER ARTHROSCOPY WITH DISTAL CLAVICLE RESECTION;  Surgeon: Ninetta Lights, MD;  Location: [place];  Service: Orthopedics;  Laterality: Right;  . SHOULDER ARTHROSCOPY WITH SUBACROMIAL DECOMPRESSION Right 09/25/2015   Procedure: RIGHT SHOULDER ARTHROSCOPY DEBRIDEMENT,ACROMIOPLASTY,DISTAL CLAVICAL EXCISION, RELEASE OF BICEPS TENDON;  Surgeon: Ninetta Lights, MD;  Location: [place];  Service: Orthopedics;  Laterality: Right;  . TMJ ARTHROPLASTY    . TONSILLECTOMY    . UTERINE FIBROID SURGERY     polups and fibroids removed   . WRIST SURGERY     left    There were no vitals filed for this visit.      Subjective Assessment - 12/08/15 1022    Subjective I am getting over bronchitits. My shoulder was doing great unitl I grabbed a door this Sat and it pulled the shoulder.  Currently in Pain? Yes   Pain Score 3    Pain Location Shoulder   Pain Orientation Right   Pain Descriptors / Indicators Dull;Aching   Aggravating Factors  Hurt it grabbing a door this past weekend.    Pain Relieving Factors Tinme, rest   Multiple Pain Sites No            [facility] PT Assessment - 12/08/15 0001      Assessment   Medical Diagnosis s/p acromioplasty Rt, SAD   Referring Provider Kathryne Hitch MD     Observation/Other Assessments   Focus on Therapeutic Outcomes (FOTO)  33% limited     ROM / Strength   AROM / PROM / Strength --  General shoulder strength 4-4+/5     AROM   Right Shoulder Flexion 153 Degrees   Right  Shoulder ABduction 135 Degrees   Right Shoulder Internal Rotation --  Reaches to upper lumbar                     [facility] Adult PT Treatment/Exercise - 12/08/15 0001      Shoulder Exercises: Seated   Flexion Strengthening;Both;10 reps;Weights   Flexion Weight (lbs) 2     Shoulder Exercises: Standing   Extension Strengthening;Both;20 reps   Theraband Level (Shoulder Extension) Level 2 (Red)   Row Strengthening;Right;20 reps   Theraband Level (Shoulder Row) Level 2 (Red)                PT Education - 12/08/15 1101    Education provided Yes   Education Details Review of final HEP   Person(s) Educated Patient   Methods Explanation;Demonstration   Comprehension Verbalized understanding;Returned demonstration          PT Short Term Goals - 11/17/15 1104      PT SHORT TERM GOAL #2   Title demonstrate Rt shoulder AROM flexion to > or = to 110 degrees to improve reacing overhead   Time 4   Period Weeks   Status Achieved     PT SHORT TERM GOAL #3   Title report a 30% reduction in Rt shoulder pain with use of Rt UE with self-care and ADLs   Time 4   Period Weeks   Status Achieved  30%     PT SHORT TERM GOAL #4   Title demonstrate 4-/5 Rt shoulder strength throughout to improve endurance with use   Time 4   Period Weeks   Status Achieved           PT Long Term Goals - 12/08/15 1026      PT LONG TERM GOAL #1   Title be independent in advanced HEP   Time 8   Period Weeks   Status Achieved     PT LONG TERM GOAL #2   Title reduce FOTO to < or = to 36% limitation   Time 8   Period Weeks   Status Achieved     PT LONG TERM GOAL #3   Title demonstrate Rt shoulder AROM IR to L2 or higher to assist with self-care   Time 8   Period Weeks   Status Achieved     PT LONG TERM GOAL #4   Title report a 60% reduction in Rt shoulder pain with use   Time 8   Period Weeks   Status Achieved  85%     PT LONG TERM GOAL #5   Title demonstrate > or =  to 4 to 4+/5 Rt  shoulder strength to improve endurance with use of Rt UE   Time 8   Period Weeks   Status Achieved               Plan - 12/08/15 1025    Clinical Impression Statement Pt has met all LTGs as of today and feels satisfied with her functional progress. She is slated for LT total shoulder replacement at the end of this year.    Rehab Potential Good   PT Frequency 2x / week   PT Duration 8 weeks   PT Treatment/Interventions ADLs/Self Care Home Management;Electrical Stimulation;Cryotherapy;Functional mobility training;Moist Heat;Therapeutic activities;Therapeutic exercise;Neuromuscular re-education;Patient/family education;Passive range of motion;Scar mobilization;Manual techniques;Vasopneumatic Device;Taping   PT Next Visit Plan Discharge to HEP.   Consulted and Agree with Plan of Care --      Patient will benefit from skilled therapeutic intervention in order to improve the following deficits and impairments:  Decreased range of motion, Pain, Increased muscle spasms, Decreased endurance, Decreased activity tolerance, Postural dysfunction, Increased edema, Decreased strength, Impaired flexibility  Visit Diagnosis: Stiffness of right shoulder, not elsewhere classified  Muscle weakness (generalized)  Acute pain of right shoulder     Problem List Patient Active Problem List   Diagnosis Date Noted  . History of palpitations 09/16/2015  . Pre-operative cardiovascular examination 09/16/2015  . Bipolar 1 disorder ([place]) 09/16/2015  . Palpitations 08/28/2014  . Hyperlipidemia LDL goal <70 11/26/2013  . History of breast cancer 11/16/2012  . Fibromyalgia 11/16/2012  . History of tobacco abuse 11/16/2012  . MRSA 04/13/2010  . INTERTRIGO 04/13/2010  . GROUP B STREPTOCOCCUS INFECTION, HX OF 04/13/2010  . HYPERLIPIDEMIA 09/02/2006  . DEPRESSION 09/02/2006  . Essential hypertension 09/02/2006  . COPD 09/02/2006  . GERD 09/02/2006    Myrene Galas, PTA 12/08/15  11:03 AM PHYSICAL THERAPY DISCHARGE SUMMARY  Visits from Start of Care: 13  Current functional level related to goals / functional outcomes: See above for current status.   Remaining deficits: See above for current status.  All goals met.     Education / Equipment: HEP Plan: Patient agrees to discharge.  Patient goals were met. Patient is being discharged due to meeting the stated rehab goals.  ?????        Sigurd Sos, PT 12/08/15 11:22 AM  [facility] [address], [place], [place], [address] Phone: [phone]   Fax:  [phone]  Name: DANAE OLAND MRN: [id number] Date of Birth: [birth year]

## 2015-12-12 DIAGNOSIS — J209 Acute bronchitis, unspecified: Secondary | ICD-10-CM | POA: Diagnosis not present

## 2015-12-20 ENCOUNTER — Other Ambulatory Visit: Payer: Self-pay | Admitting: Cardiovascular Disease

## 2015-12-22 NOTE — Telephone Encounter (Signed)
Rx request sent to pharmacy.  

## 2015-12-23 DIAGNOSIS — M7521 Bicipital tendinitis, right shoulder: Secondary | ICD-10-CM | POA: Diagnosis not present

## 2016-01-05 DIAGNOSIS — M19012 Primary osteoarthritis, left shoulder: Secondary | ICD-10-CM | POA: Diagnosis not present

## 2016-01-05 DIAGNOSIS — G8918 Other acute postprocedural pain: Secondary | ICD-10-CM | POA: Diagnosis not present

## 2016-01-05 DIAGNOSIS — M7501 Adhesive capsulitis of right shoulder: Secondary | ICD-10-CM | POA: Diagnosis not present

## 2016-01-05 HISTORY — PX: TOTAL SHOULDER ARTHROPLASTY: SHX126

## 2016-01-09 ENCOUNTER — Other Ambulatory Visit (HOSPITAL_COMMUNITY): Payer: Self-pay

## 2016-01-09 DIAGNOSIS — F3162 Bipolar disorder, current episode mixed, moderate: Secondary | ICD-10-CM

## 2016-01-09 DIAGNOSIS — F319 Bipolar disorder, unspecified: Secondary | ICD-10-CM

## 2016-01-09 MED ORDER — TRAZODONE HCL 100 MG PO TABS: 200.0000 mg | ORAL_TABLET | Freq: Every day | ORAL | 0 refills | Status: DC

## 2016-01-09 MED ORDER — VENLAFAXINE HCL ER 150 MG PO CP24: 150.0000 mg | ORAL_CAPSULE | Freq: Every day | ORAL | 0 refills | Status: DC

## 2016-01-09 MED ORDER — LAMOTRIGINE 150 MG PO TABS: 150.0000 mg | ORAL_TABLET | Freq: Two times a day (BID) | ORAL | 0 refills | Status: DC

## 2016-01-13 DIAGNOSIS — M19012 Primary osteoarthritis, left shoulder: Secondary | ICD-10-CM | POA: Diagnosis not present

## 2016-01-15 ENCOUNTER — Ambulatory Visit: Payer: Commercial Managed Care - HMO | Attending: Orthopedic Surgery

## 2016-01-15 DIAGNOSIS — R600 Localized edema: Secondary | ICD-10-CM

## 2016-01-15 DIAGNOSIS — M25512 Pain in left shoulder: Secondary | ICD-10-CM | POA: Diagnosis not present

## 2016-01-15 DIAGNOSIS — M25611 Stiffness of right shoulder, not elsewhere classified: Secondary | ICD-10-CM | POA: Diagnosis not present

## 2016-01-15 DIAGNOSIS — M6281 Muscle weakness (generalized): Secondary | ICD-10-CM

## 2016-01-15 DIAGNOSIS — M25511 Pain in right shoulder: Secondary | ICD-10-CM | POA: Diagnosis not present

## 2016-01-15 DIAGNOSIS — M25612 Stiffness of left shoulder, not elsewhere classified: Secondary | ICD-10-CM

## 2016-01-15 NOTE — Therapy (Signed)
[facility] [address] [place], [place], [address] Phone: [phone]   Fax:  [phone]  Physical Therapy Evaluation  Patient Details  Name: Anita Arroyo MRN: [id number] Date of Birth: 1960-08-10 Referring Provider: Kathryne Hitch, MD  Encounter Date: 01/15/2016      PT End of Session - 01/15/16 0918    Visit Number 1   Number of Visits 10   Date for PT Re-Evaluation 03/11/16   PT Start Time 0846   PT Stop Time 0930   PT Time Calculation (min) 44 min   Activity Tolerance Patient tolerated treatment well   Behavior During Therapy [facility] for tasks assessed/performed      Past Medical History:  Diagnosis Date  . ADHD (attention deficit hyperactivity disorder)   . Arthritis   . Asthma   . Bipolar 1 disorder ([place])   . Bipolar 1 disorder ([place])   . Breast cancer ([place])   . Depression   . Dysrhythmia    palpitations  . Dysrhythmia    atrial fibrillation  . Fibromyalgia   . GERD (gastroesophageal reflux disease)   . Heart murmur   . History of stress test 04/28/2011   showed normal perfusion without scar or ischemia  . Hx of echocardiogram 04/28/2011   showed normal systolic function with mild diastolic dysfunction,she had trace MR but did not have frank mitral valve prolapse demonstrated. She had mild pulmonary hypertension with an estimated RV systolic pressure at 34 mm.  . Hyperlipemia   . Meniere disease   . MVP (mitral valve prolapse)   . Neuromuscular disorder ([place])    fibromyalgia    Past Surgical History:  Procedure Laterality Date  . ANKLE SURGERY     left x4  . BREAST SURGERY  2011   lumpectomy right breast   . COLONOSCOPY WITH PROPOFOL N/A 02/25/2014   Procedure: COLONOSCOPY WITH PROPOFOL;  Surgeon: Garlan Fair, MD;  Location: [facility];  Service: Endoscopy;  Laterality: N/A;  . deviated septum    . ECTOPIC PREGNANCY SURGERY    . ELBOW SURGERY     right elbow  . EYE SURGERY      cataract surgery  . fibroid     2-3 fibroid adenomas removed  . JOINT REPLACEMENT  2011   right knee   . KNEE SURGERY     left knee  . SHOULDER ARTHROSCOPY WITH BICEPSTENOTOMY Right 09/25/2015   Procedure: SHOULDER ARTHROSCOPY WITH BICEPSTENOTOMY;  Surgeon: Ninetta Lights, MD;  Location: [place];  Service: Orthopedics;  Laterality: Right;  . SHOULDER ARTHROSCOPY WITH DISTAL CLAVICLE RESECTION Right 09/25/2015   Procedure: SHOULDER ARTHROSCOPY WITH DISTAL CLAVICLE RESECTION;  Surgeon: Ninetta Lights, MD;  Location: [facility];  Service: Orthopedics;  Laterality: Right;  . SHOULDER ARTHROSCOPY WITH SUBACROMIAL DECOMPRESSION Right 09/25/2015   Procedure: RIGHT SHOULDER ARTHROSCOPY DEBRIDEMENT,ACROMIOPLASTY,DISTAL CLAVICAL EXCISION, RELEASE OF BICEPS TENDON;  Surgeon: Ninetta Lights, MD;  Location: [place];  Service: Orthopedics;  Laterality: Right;  . TMJ ARTHROPLASTY    . TONSILLECTOMY    . TOTAL SHOULDER ARTHROPLASTY Left 01/05/2016  . UTERINE FIBROID SURGERY     polups and fibroids removed   . WRIST SURGERY     left    There were no vitals filed for this visit.       Subjective Assessment - 01/15/16 0841    Subjective Pt presents to PT s/p Lt total shoulder replacement and Rt shoulder manipulation surgery  performed 01/05/16.     Pertinent History Rt shoulder manipulation and Lt total shoulder replacement: 01/05/16   Patient Stated Goals improve A/ROM, P/ROM of Rt and Lt shoulder, use Lt UE   Currently in Pain? Yes   Pain Score 9    Pain Location Shoulder   Pain Orientation Right;Left   Pain Descriptors / Indicators Aching;Shooting;Sore   Pain Type Surgical pain   Pain Onset 1 to 4 weeks ago   Pain Frequency Constant   Aggravating Factors  movement, night time, daily activity   Pain Relieving Factors medication, ice, rest            [facility] PT Assessment - 01/15/16 0001      Assessment   Medical Diagnosis s/p Rt  shoulder manipulation and s/p LT total shoulder replacement   Referring Provider Kathryne Hitch, MD   Onset Date/Surgical Date 01/05/16     Precautions   Precautions Shoulder   Type of Shoulder Precautions follow protocol after total shoulder replacement     Restrictions   Weight Bearing Restrictions No     Balance Screen   Has the patient fallen in the past 6 months No   Has the patient had a decrease in activity level because of a fear of falling?  No   Is the patient reluctant to leave their home because of a fear of falling?  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     Cognition   Overall Cognitive Status Within Functional Limits for tasks assessed     Observation/Other Assessments   Focus on Therapeutic Outcomes (FOTO)  96% limitation     Posture/Postural Control   Posture/Postural Control Postural limitations   Postural Limitations Rounded Shoulders;Forward head     ROM / Strength   AROM / PROM / Strength AROM;PROM;Strength     AROM   Overall AROM  Deficits   Overall AROM Comments Lt shoulder AROM not tested due to post-op   AROM Assessment Site Shoulder   Right/Left Shoulder Right;Left   Right Shoulder Flexion 115 Degrees   Right Shoulder ABduction 75 Degrees   Right Shoulder Internal Rotation --  medial sacrum   Right Shoulder External Rotation --  T1     PROM   Overall PROM  Deficits   PROM Assessment Site Shoulder   Right/Left Shoulder Right;Left   Right Shoulder Flexion 130 Degrees   Right Shoulder ABduction 110 Degrees   Right Shoulder Internal Rotation 20 Degrees   Right Shoulder External Rotation 30 Degrees   Left Shoulder Flexion 65 Degrees   Left Shoulder Internal Rotation 28  Degrees  30 degrees abduction   Left Shoulder External Rotation 10 Degrees  30 degrees abduction     Strength   Overall Strength Deficits   Overall Strength Comments Lt arm not tested due to post-op. Rt strength tested in neutral: flexion/extension 4/5, IR 4-/5, ER 4/5     Palpation   Palpation comment diffuse palpable tenderness over Rt deltoid and rotator cuff, Lt incision 4.5 inches with reduced mobility, diffuse palpable tenderness over Lt deltoid and rotator cuff with edema/warmth  [place] Adult PT Treatment/Exercise - 01/15/16 0001      Vasopneumatic   Number Minutes Vasopneumatic  15 minutes   Vasopnuematic Location  Shoulder   Vasopneumatic Pressure Medium   Vasopneumatic Temperature  3 snowflakes                PT Education - 01/15/16 0911    Education provided Yes   Education Details sling exercises, Rt shoulder table slides   Person(s) Educated Patient   Methods Explanation;Demonstration   Comprehension Verbalized understanding;Returned demonstration          PT Short Term Goals - 01/15/16 0835      PT SHORT TERM GOAL #1   Title be independent in initial HEP   Time 4   Period Weeks   Status New     PT SHORT TERM GOAL #2   Title demonstrate Rt shoulder AROM flexion to > or = to 120 degrees to improve reacing overhead   Time 4   Period Weeks   Status New     PT SHORT TERM GOAL #3   Title demonstrate Lt shoulder PROM flexion to 90 degrees to improve mobility and allow for use as able   Time 4   Period Weeks   Status New     PT SHORT TERM GOAL #4   Title demonstrate 4+/5 Rt shoulder strength to improve use   Time 4   Period Days   Status New     PT SHORT TERM GOAL #5   Title report > or = to 50% use of Rt UE with self-care and ADLs   Time 4   Period Weeks   Status New     Additional Short Term Goals   Additional Short Term Goals Yes     PT SHORT TERM GOAL #6   Title report < or = to 5/10 Lt shoulder pain  with daily activity   Time 4   Period Weeks   Status New           PT Long Term Goals - 01/15/16 [id number]      PT LONG TERM GOAL #1   Title be independent in advanced HEP   Time 8   Period Weeks   Status New     PT LONG TERM GOAL #2   Title reduce FOTO to < or = to 55% limitation   Time 8   Period Weeks   Status New     PT LONG TERM GOAL #3   Title demonstrate Lt shoulder P/ROM flexion to > or = to 110 degrees to improve use   Time 8   Period Weeks     PT LONG TERM GOAL #4   Title demonstrate Lt shoulder P/ROM IR/ER to > or = to 45 degrees to improve use   Time 8   Period Days   Status New     PT LONG TERM GOAL #5   Title demonstrate Rt shoulder IR to L2 to improve use with self-care   Time 8   Period Weeks   Status New     Additional Long Term Goals   Additional Long Term Goals Yes     PT LONG TERM GOAL #6   Title report < or = to 4/10 Rt and Lt shoulder pain with daily use   Time 8   Period Weeks   Status New               Plan - 01/15/16 [id number]  Clinical Impression Statement Pt presents to PT s/p Lt total shoulder replacement and Rt shoulder manipulation performed 01/05/16.  Pt is a moderate complexity evaluation due to evolving condition, multiple body parts assessed and >2 co-morbidities including joint replacements and recent prior Rt shoulder surgery.  Pt demonstrates limited Rt shoulder A/ROM, P/ROM and strength with pain with all.  Lt shoulder with limited P/ROM and pain rated 9/10.  Pt with warmth and edema about the Lt joint .  Pt will benefit from skilled PT for progression of Rt shoulder A/ROM and strength to return to regular use and safe progression of Lt shoulder replacement post-op protocol.     Rehab Potential Good   PT Frequency 2x / week   PT Duration 8 weeks   PT Treatment/Interventions ADLs/Self Care Home Management;Electrical Stimulation;Cryotherapy;Functional mobility training;Moist Heat;Therapeutic activities;Therapeutic  exercise;Neuromuscular re-education;Patient/family education;Passive range of motion;Scar mobilization;Manual techniques;Vasopneumatic Device;Taping   PT Next Visit Plan Rt shoulder A/ROM and strength progression as tolerated, follow post-op protocol for Lt shoulder, pain and edema management.     Consulted and Agree with Plan of Care Patient      Patient will benefit from skilled therapeutic intervention in order to improve the following deficits and impairments:  Decreased range of motion, Pain, Increased muscle spasms, Decreased endurance, Decreased activity tolerance, Postural dysfunction, Increased edema, Decreased strength, Impaired flexibility, Decreased scar mobility, Impaired UE functional use  Visit Diagnosis: Muscle weakness (generalized) - Plan: PT plan of care cert/re-cert  Acute pain of left shoulder - Plan: PT plan of care cert/re-cert  Stiffness of left shoulder, not elsewhere classified - Plan: PT plan of care cert/re-cert  Acute pain of right shoulder - Plan: PT plan of care cert/re-cert  Stiffness of right shoulder, not elsewhere classified - Plan: PT plan of care cert/re-cert  Localized edema - Plan: PT plan of care cert/re-cert      G-Codes - [id number]    Functional Assessment Tool Used FOTO: 96% limitation   Functional Limitation Other PT primary   Other PT Primary Current Status [id number]) At least 80 percent but less than 100 percent impaired, limited or restricted   Other PT Primary Goal Status [id number]) At least 40 percent but less than 60 percent impaired, limited or restricted       Problem List Patient Active Problem List   Diagnosis Date Noted  . History of palpitations 09/16/2015  . Pre-operative cardiovascular examination 09/16/2015  . Bipolar 1 disorder ([place]) 09/16/2015  . Palpitations 08/28/2014  . Hyperlipidemia LDL goal <70 11/26/2013  . History of breast cancer 11/16/2012  . Fibromyalgia 11/16/2012  . History of tobacco abuse 11/16/2012   . MRSA 04/13/2010  . INTERTRIGO 04/13/2010  . GROUP B STREPTOCOCCUS INFECTION, HX OF 04/13/2010  . HYPERLIPIDEMIA 09/02/2006  . DEPRESSION 09/02/2006  . Essential hypertension 09/02/2006  . COPD 09/02/2006  . GERD 09/02/2006     Sigurd Sos, PT 01/15/16 9:31 AM  [facility] [address] [place], [place], [address] Phone: [phone]   Fax:  [phone]  Name: KAMYAH MILLIARD MRN: [id number] Date of Birth: 1960/07/24

## 2016-01-15 NOTE — Patient Instructions (Addendum)
ROM: Pendulum (Circular)  Let right arm move in circle clockwise, then counterclockwise, by rocking body weight in circular pattern. Circle _10___ times each direction per set. Do _3___ sessions per day.  Pendulum Side to Side  Bend forward 90 at waist, leaning on table for support. Rock body from side to side and let arm swing freely. Repeat _10___ times. Do __3__ sessions per day.  Finger Flexors  Keeping right fingertips straight, press putty toward base of palm. Repeat __20__ times. Do __3__ sessions per day. Activity: Squeeze flour sifter, plastic squeeze bottles, [place] baster, juice from fruit.*     SHOULDER: Flexion On Table (RIGHT arm only)  Place hands on table, elbows straight. Move hips away from body. Press hands down into table. Hold _3__ seconds. _10__ reps per set, __3_ sets per day.  Elevation: Shrug (Distal Resist)  Also add circles forward and back   Lift shoulders straight up, then return. Maintain same speed up and down. Avoid moving head and neck forward. Repeat _10___ times per set. Do __1-2__ sets per session. Do many times daily. Copyright  VHI. All rights reserved.   [facility] [address], [place],  [address] Phone # [phone] Fax [phone]

## 2016-01-16 ENCOUNTER — Ambulatory Visit: Payer: Commercial Managed Care - HMO | Admitting: Physical Therapy

## 2016-01-16 ENCOUNTER — Encounter: Payer: Self-pay | Admitting: Physical Therapy

## 2016-01-16 DIAGNOSIS — M25612 Stiffness of left shoulder, not elsewhere classified: Secondary | ICD-10-CM | POA: Diagnosis not present

## 2016-01-16 DIAGNOSIS — M25511 Pain in right shoulder: Secondary | ICD-10-CM | POA: Diagnosis not present

## 2016-01-16 DIAGNOSIS — R600 Localized edema: Secondary | ICD-10-CM | POA: Diagnosis not present

## 2016-01-16 DIAGNOSIS — M25512 Pain in left shoulder: Secondary | ICD-10-CM | POA: Diagnosis not present

## 2016-01-16 DIAGNOSIS — M25611 Stiffness of right shoulder, not elsewhere classified: Secondary | ICD-10-CM | POA: Diagnosis not present

## 2016-01-16 DIAGNOSIS — M6281 Muscle weakness (generalized): Secondary | ICD-10-CM | POA: Diagnosis not present

## 2016-01-16 NOTE — Therapy (Signed)
[facility] [address] [place], [place], [address] Phone: [phone]   Fax:  [phone]  Physical Therapy Treatment  Patient Details  Name: Anita Arroyo MRN: [id number] Date of Birth: 06-04-1960 Referring Provider: Mckinley Jewel, MD  Encounter Date: 01/16/2016      PT End of Session - 01/16/16 0930    Visit Number 2   Number of Visits 10   Date for PT Re-Evaluation 03/11/16   Authorization Time Period KX mod?   PT Start Time [phone]   PT Stop Time 0940   PT Time Calculation (min) 53 min   Activity Tolerance Patient limited by pain   Behavior During Therapy Anxious      Past Medical History:  Diagnosis Date  . ADHD (attention deficit hyperactivity disorder)   . Arthritis   . Asthma   . Bipolar 1 disorder ([facility])   . Bipolar 1 disorder ([facility])   . Breast cancer ([facility])   . Depression   . Dysrhythmia    palpitations  . Dysrhythmia    atrial fibrillation  . Fibromyalgia   . GERD (gastroesophageal reflux disease)   . Heart murmur   . History of stress test 04/28/2011   showed normal perfusion without scar or ischemia  . Hx of echocardiogram 04/28/2011   showed normal systolic function with mild diastolic dysfunction,she had trace MR but did not have frank mitral valve prolapse demonstrated. She had mild pulmonary hypertension with an estimated RV systolic pressure at 34 mm.  . Hyperlipemia   . Meniere disease   . MVP (mitral valve prolapse)   . Neuromuscular disorder ([facility])    fibromyalgia    Past Surgical History:  Procedure Laterality Date  . ANKLE SURGERY     left x4  . BREAST SURGERY  2011   lumpectomy right breast   . COLONOSCOPY WITH PROPOFOL N/A 02/25/2014   Procedure: COLONOSCOPY WITH PROPOFOL;  Surgeon: Charolett Bumpers, MD;  Location: [facility];  Service: Endoscopy;  Laterality: N/A;  . deviated septum    . ECTOPIC PREGNANCY SURGERY    . ELBOW SURGERY     right elbow  . EYE SURGERY     cataract surgery  . fibroid     2-3 fibroid adenomas removed  . JOINT REPLACEMENT  2011   right knee   . KNEE SURGERY     left knee  . SHOULDER ARTHROSCOPY WITH BICEPSTENOTOMY Right 09/25/2015   Procedure: SHOULDER ARTHROSCOPY WITH BICEPSTENOTOMY;  Surgeon: Loreta Ave, MD;  Location: [facility];  Service: Orthopedics;  Laterality: Right;  . SHOULDER ARTHROSCOPY WITH DISTAL CLAVICLE RESECTION Right 09/25/2015   Procedure: SHOULDER ARTHROSCOPY WITH DISTAL CLAVICLE RESECTION;  Surgeon: Loreta Ave, MD;  Location: [facility];  Service: Orthopedics;  Laterality: Right;  . SHOULDER ARTHROSCOPY WITH SUBACROMIAL DECOMPRESSION Right 09/25/2015   Procedure: RIGHT SHOULDER ARTHROSCOPY DEBRIDEMENT,ACROMIOPLASTY,DISTAL CLAVICAL EXCISION, RELEASE OF BICEPS TENDON;  Surgeon: Loreta Ave, MD;  Location: [facility];  Service: Orthopedics;  Laterality: Right;  . TMJ ARTHROPLASTY    . TONSILLECTOMY    . TOTAL SHOULDER ARTHROPLASTY Left 01/05/2016  . UTERINE FIBROID SURGERY     polups and fibroids removed   . WRIST SURGERY     left    There were no vitals filed for this visit.      Subjective Assessment - 01/16/16 0854    Currently in Pain? Yes  RT shoulder 2/10 at rest, 7/10 with use:  LT in sling 6/10   Pain Location Shoulder   Pain Descriptors / Indicators Sore;Aching                         [facility] Adult PT Treatment/Exercise - 01/16/16 0001      Moist Heat Therapy   Number Minutes Moist Heat 15 Minutes   Moist Heat Location --  Rt shoulder seated     Electrical Stimulation   Electrical Stimulation Location Lt shoulder seated   Electrical Stimulation Action IFC   Electrical Stimulation Parameters 15 min to tolerance  concurrent with Patent attorney Goals Edema;Pain     Vasopneumatic   Number Minutes Vasopneumatic  15 minutes   Vasopnuematic Location  Shoulder   Vasopneumatic Pressure Medium    Vasopneumatic Temperature  3 snowflakes  Seated position with support to LT arm     Manual Therapy   Manual Therapy Soft tissue mobilization;Passive ROM   Soft tissue mobilization Bil upper arm, scar massage LT, bil upper traps   Passive ROM Bil shoulder within protocol limits LT to pt's tolerance                PT Education - 01/15/16 0911    Education provided Yes   Education Details sling exercises, Rt shoulder table slides   Person(s) Educated Patient   Methods Explanation;Demonstration   Comprehension Verbalized understanding;Returned demonstration          PT Short Term Goals - 01/15/16 0835      PT SHORT TERM GOAL #1   Title be independent in initial HEP   Time 4   Period Weeks   Status New     PT SHORT TERM GOAL #2   Title demonstrate Rt shoulder AROM flexion to > or = to 120 degrees to improve reacing overhead   Time 4   Period Weeks   Status New     PT SHORT TERM GOAL #3   Title demonstrate Lt shoulder PROM flexion to 90 degrees to improve mobility and allow for use as able   Time 4   Period Weeks   Status New     PT SHORT TERM GOAL #4   Title demonstrate 4+/5 Rt shoulder strength to improve use   Time 4   Period Days   Status New     PT SHORT TERM GOAL #5   Title report > or = to 50% use of Rt UE with self-care and ADLs   Time 4   Period Weeks   Status New     Additional Short Term Goals   Additional Short Term Goals Yes     PT SHORT TERM GOAL #6   Title report < or = to 5/10 Lt shoulder pain with daily activity   Time 4   Period Weeks   Status New           PT Long Term Goals - 01/15/16 1308      PT LONG TERM GOAL #1   Title be independent in advanced HEP   Time 8   Period Weeks   Status New     PT LONG TERM GOAL #2   Title reduce FOTO to < or = to 55% limitation   Time 8   Period Weeks   Status New     PT LONG TERM GOAL #3   Title demonstrate Lt shoulder P/ROM flexion to > or = to 110 degrees to improve use  Time  8   Period Weeks     PT LONG TERM GOAL #4   Title demonstrate Lt shoulder P/ROM IR/ER to > or = to 45 degrees to improve use   Time 8   Period Days   Status New     PT LONG TERM GOAL #5   Title demonstrate Rt shoulder IR to L2 to improve use with self-care   Time 8   Period Weeks   Status New     Additional Long Term Goals   Additional Long Term Goals Yes     PT LONG TERM GOAL #6   Title report < or = to 4/10 Rt and Lt shoulder pain with daily use   Time 8   Period Weeks   Status New               Plan - 01/16/16 0913    Clinical Impression Statement Pt did not tolerate PROM for flexion very well, painful: pt teary. PROM on RT better in scaption but painful with pt guarding in flrxion/abdution > 90degrees. Pt has not done any HEP per her report today. Added estim to LT shoulder with Game ready to reduce pain after PROM.  Scar was mobile bicep was knotty on left.    Rehab Potential Good   PT Frequency 2x / week   PT Duration 8 weeks   PT Treatment/Interventions ADLs/Self Care Home Management;Electrical Stimulation;Cryotherapy;Functional mobility training;Moist Heat;Therapeutic activities;Therapeutic exercise;Neuromuscular re-education;Patient/family education;Passive range of motion;Scar mobilization;Manual techniques;Vasopneumatic Device;Taping   PT Next Visit Plan Rt shoulder A/ROM and strength progression as tolerated, follow post-op protocol for Lt shoulder, pain and edema management.        Patient will benefit from skilled therapeutic intervention in order to improve the following deficits and impairments:  Decreased range of motion, Pain, Increased muscle spasms, Decreased endurance, Decreased activity tolerance, Postural dysfunction, Increased edema, Decreased strength, Impaired flexibility, Decreased scar mobility, Impaired UE functional use  Visit Diagnosis: Acute pain of left shoulder  Stiffness of left shoulder, not elsewhere classified  Acute pain of  right shoulder  Stiffness of right shoulder, not elsewhere classified  Localized edema       G-Codes - 2016/01/21 0841    Functional Assessment Tool Used FOTO: 96% limitation   Functional Limitation Other PT primary   Other PT Primary Current Status ([id number]) At least 80 percent but less than 100 percent impaired, limited or restricted   Other PT Primary Goal Status ([id number]) At least 40 percent but less than 60 percent impaired, limited or restricted      Problem List Patient Active Problem List   Diagnosis Date Noted  . History of palpitations 09/16/2015  . Pre-operative cardiovascular examination 09/16/2015  . Bipolar 1 disorder ([facility]) 09/16/2015  . Palpitations 08/28/2014  . Hyperlipidemia LDL goal <70 11/26/2013  . History of breast cancer 11/16/2012  . Fibromyalgia 11/16/2012  . History of tobacco abuse 11/16/2012  . MRSA 04/13/2010  . INTERTRIGO 04/13/2010  . GROUP B STREPTOCOCCUS INFECTION, HX OF 04/13/2010  . HYPERLIPIDEMIA 09/02/2006  . DEPRESSION 09/02/2006  . Essential hypertension 09/02/2006  . COPD 09/02/2006  . GERD 09/02/2006    Anita Arroyo, PTA 01/16/2016, 9:34 AM  [facility] [address] [place], [place], [address] Phone: [phone]   Fax:  [phone]  Name: Anita Arroyo MRN: [id number] Date of Birth: [birth year]

## 2016-01-22 ENCOUNTER — Ambulatory Visit: Payer: Commercial Managed Care - HMO

## 2016-01-22 DIAGNOSIS — M25611 Stiffness of right shoulder, not elsewhere classified: Secondary | ICD-10-CM | POA: Diagnosis not present

## 2016-01-22 DIAGNOSIS — M25512 Pain in left shoulder: Secondary | ICD-10-CM | POA: Diagnosis not present

## 2016-01-22 DIAGNOSIS — M6281 Muscle weakness (generalized): Secondary | ICD-10-CM | POA: Diagnosis not present

## 2016-01-22 DIAGNOSIS — M25612 Stiffness of left shoulder, not elsewhere classified: Secondary | ICD-10-CM

## 2016-01-22 DIAGNOSIS — R600 Localized edema: Secondary | ICD-10-CM

## 2016-01-22 DIAGNOSIS — M25511 Pain in right shoulder: Secondary | ICD-10-CM

## 2016-01-22 NOTE — Therapy (Signed)
[facility] [address] [place], [place], [address] Phone: [phone]   Fax:  [phone]  Physical Therapy Treatment  Patient Details  Name: Anita Arroyo MRN: [id number] Date of Birth: [birth year] Referring Provider: Kathryne Hitch, MD  Encounter Date: 01/22/2016      Anita Arroyo End of Session - 01/22/16 1052    Visit Number 3   Number of Visits 10   Date for Anita Arroyo Re-Evaluation 03/11/16   Anita Arroyo Start Time [id number]   Anita Arroyo Stop Time 1106   Anita Arroyo Time Calculation (min) 52 min   Activity Tolerance Patient tolerated treatment well   Behavior During Therapy [facility] for tasks assessed/performed      Past Medical History:  Diagnosis Date  . ADHD (attention deficit hyperactivity disorder)   . Arthritis   . Asthma   . Bipolar 1 disorder ([place])   . Bipolar 1 disorder ([place])   . Breast cancer ([place])   . Depression   . Dysrhythmia    palpitations  . Dysrhythmia    atrial fibrillation  . Fibromyalgia   . GERD (gastroesophageal reflux disease)   . Heart murmur   . History of stress test 04/28/2011   showed normal perfusion without scar or ischemia  . Hx of echocardiogram 04/28/2011   showed normal systolic function with mild diastolic dysfunction,she had trace MR but did not have frank mitral valve prolapse demonstrated. She had mild pulmonary hypertension with an estimated RV systolic pressure at 34 mm.  . Hyperlipemia   . Meniere disease   . MVP (mitral valve prolapse)   . Neuromuscular disorder ([place])    fibromyalgia    Past Surgical History:  Procedure Laterality Date  . ANKLE SURGERY     left x4  . BREAST SURGERY  2011   lumpectomy right breast   . COLONOSCOPY WITH PROPOFOL N/A 02/25/2014   Procedure: COLONOSCOPY WITH PROPOFOL;  Surgeon: Garlan Fair, MD;  Location: [facility];  Service: Endoscopy;  Laterality: N/A;  . deviated septum    . ECTOPIC PREGNANCY SURGERY    . ELBOW SURGERY     right elbow  . EYE SURGERY     cataract surgery  . fibroid     2-3 fibroid adenomas removed  . JOINT REPLACEMENT  2011   right knee   . KNEE SURGERY     left knee  . SHOULDER ARTHROSCOPY WITH BICEPSTENOTOMY Right 09/25/2015   Procedure: SHOULDER ARTHROSCOPY WITH BICEPSTENOTOMY;  Surgeon: Ninetta Lights, MD;  Location: [place];  Service: Orthopedics;  Laterality: Right;  . SHOULDER ARTHROSCOPY WITH DISTAL CLAVICLE RESECTION Right 09/25/2015   Procedure: SHOULDER ARTHROSCOPY WITH DISTAL CLAVICLE RESECTION;  Surgeon: Ninetta Lights, MD;  Location: [place];  Service: Orthopedics;  Laterality: Right;  . SHOULDER ARTHROSCOPY WITH SUBACROMIAL DECOMPRESSION Right 09/25/2015   Procedure: RIGHT SHOULDER ARTHROSCOPY DEBRIDEMENT,ACROMIOPLASTY,DISTAL CLAVICAL EXCISION, RELEASE OF BICEPS TENDON;  Surgeon: Ninetta Lights, MD;  Location: [place];  Service: Orthopedics;  Laterality: Right;  . TMJ ARTHROPLASTY    . TONSILLECTOMY    . TOTAL SHOULDER ARTHROPLASTY Left 01/05/2016  . UTERINE FIBROID SURGERY     polups and fibroids removed   . WRIST SURGERY     left    There were no vitals filed for this visit.      Subjective Assessment - 01/22/16 1013    Subjective I moved back home yesterday and slept in my bed.     Patient Stated  Goals improve A/ROM, P/ROM of Rt and Lt shoulder, use Lt UE   Currently in Pain? Yes   Pain Score 7    Pain Location Shoulder   Pain Orientation Right;Left   Pain Descriptors / Indicators Aching;Sore   Pain Type Surgical pain   Pain Onset 1 to 4 weeks ago   Pain Frequency Constant   Aggravating Factors  movement, night time, daily activity   Pain Relieving Factors muscle relaxer, ice, rest            [facility] Anita Arroyo Assessment - 01/22/16 0001      AROM   Right Shoulder Flexion --  A/AROM with finger ladder: 120                     [facility] Adult Anita Arroyo Treatment/Exercise - 01/22/16 0001      Exercises   Exercises Hand     Shoulder  Exercises: Pulleys   Flexion 3 minutes  Lt below 90 degrees, Rt to tolerance     Shoulder Exercises: ROM/Strengthening   Other ROM/Strengthening Exercises finger ladder: Rt x 10     Hand Exercises   Digiticizer red on Lt x 2 minutes     Moist Heat Therapy   Number Minutes Moist Heat 15 Minutes   Moist Heat Location Shoulder  Rt     Electrical Stimulation   Electrical Stimulation Location Lt shoulder seated   Electrical Stimulation Action IFC   Electrical Stimulation Parameters 15 minutes   Electrical Stimulation Goals Edema;Pain     Vasopneumatic   Number Minutes Vasopneumatic  15 minutes   Vasopnuematic Location  Shoulder   Vasopneumatic Pressure Medium   Vasopneumatic Temperature  3 snowflakes  Seated position with support to LT arm     Manual Therapy   Manual Therapy Soft tissue mobilization;Passive ROM   Soft tissue mobilization scar mobs on the Lt   Passive ROM Lt shoulder flexion to 90, ER within protocol limits and to Anita Arroyo tolerance                  Anita Arroyo Short Term Goals - 01/22/16 1020      Anita Arroyo SHORT TERM GOAL #1   Title be independent in initial HEP   Time 4   Period Weeks   Status On-going     Anita Arroyo SHORT TERM GOAL #2   Title demonstrate Rt shoulder AROM flexion to > or = to 120 degrees to improve reacing overhead   Time 4   Period Weeks   Status On-going     Anita Arroyo SHORT TERM GOAL #3   Title demonstrate Lt shoulder PROM flexion to 90 degrees to improve mobility and allow for use as able   Time 4   Period Weeks   Status On-going     Anita Arroyo SHORT TERM GOAL #5   Title report > or = to 50% use of Rt UE with self-care and ADLs   Time 4   Period Weeks   Status On-going           Anita Arroyo Long Term Goals - 01/15/16 [id number]      Anita Arroyo LONG TERM GOAL #1   Title be independent in advanced HEP   Time 8   Period Weeks   Status New     Anita Arroyo LONG TERM GOAL #2   Title reduce FOTO to < or = to 55% limitation   Time 8   Period Weeks   Status New     Anita Arroyo LONG  TERM  GOAL #3   Title demonstrate Lt shoulder P/ROM flexion to > or = to 110 degrees to improve use   Time 8   Period Weeks     Anita Arroyo LONG TERM GOAL #4   Title demonstrate Lt shoulder P/ROM IR/ER to > or = to 45 degrees to improve use   Time 8   Period Days   Status New     Anita Arroyo LONG TERM GOAL #5   Title demonstrate Rt shoulder IR to L2 to improve use with self-care   Time 8   Period Weeks   Status New     Additional Long Term Goals   Additional Long Term Goals Yes     Anita Arroyo LONG TERM GOAL #6   Title report < or = to 4/10 Rt and Lt shoulder pain with daily use   Time 8   Period Weeks   Status New               Plan - 01/22/16 1021    Clinical Impression Statement Anita Arroyo with improved Rt shoulder movement today and able to achieve 120 degrees A/AROM using pulleys today.  Anita Arroyo with improved tolerance for Anita Arroyo today.  Anita Arroyo reports 80% use of Rt UE with self-care tasks now.  Anita Arroyo is tolerating post-op protocol well on the Lt.  Anita Arroyo will continue to benefit from skilled Anita Arroyo for Lt shoulder ROM progression per protocol s/p surgery and progression of Rt shoulder A/ROM and strength as tolerated.     Rehab Potential Good   Anita Arroyo Frequency 2x / week   Anita Arroyo Duration 8 weeks   Anita Arroyo Treatment/Interventions ADLs/Self Care Home Management;Electrical Stimulation;Cryotherapy;Functional mobility training;Moist Heat;Therapeutic activities;Therapeutic exercise;Neuromuscular re-education;Patient/family education;Passive range of motion;Scar mobilization;Manual techniques;Vasopneumatic Device;Taping   Anita Arroyo Next Visit Plan Rt shoulder A/ROM and strength progression as tolerated, follow post-op protocol for Lt shoulder, pain and edema management.     Consulted and Agree with Plan of Care Patient      Patient will benefit from skilled therapeutic intervention in order to improve the following deficits and impairments:  Decreased range of motion, Pain, Increased muscle spasms, Decreased endurance, Decreased activity tolerance,  Postural dysfunction, Increased edema, Decreased strength, Impaired flexibility, Decreased scar mobility, Impaired UE functional use  Visit Diagnosis: Acute pain of left shoulder  Stiffness of left shoulder, not elsewhere classified  Localized edema  Muscle weakness (generalized)  Acute pain of right shoulder  Stiffness of right shoulder, not elsewhere classified     Problem List Patient Active Problem List   Diagnosis Date Noted  . History of palpitations 09/16/2015  . Pre-operative cardiovascular examination 09/16/2015  . Bipolar 1 disorder ([place]) 09/16/2015  . Palpitations 08/28/2014  . Hyperlipidemia LDL goal <70 11/26/2013  . History of breast cancer 11/16/2012  . Fibromyalgia 11/16/2012  . History of tobacco abuse 11/16/2012  . MRSA 04/13/2010  . INTERTRIGO 04/13/2010  . GROUP B STREPTOCOCCUS INFECTION, HX OF 04/13/2010  . HYPERLIPIDEMIA 09/02/2006  . DEPRESSION 09/02/2006  . Essential hypertension 09/02/2006  . COPD 09/02/2006  . GERD 09/02/2006     Anita Arroyo, Anita Arroyo 01/22/16 10:54 AM  [facility] [address] [place], [place], [address] Phone: [phone]   Fax:  [phone]  Name: Anita Arroyo MRN: [id number] Date of Birth: 15-Oct-1960

## 2016-01-28 ENCOUNTER — Ambulatory Visit: Payer: Commercial Managed Care - HMO | Attending: Orthopedic Surgery | Admitting: Physical Therapy

## 2016-01-28 ENCOUNTER — Encounter: Payer: Self-pay | Admitting: Physical Therapy

## 2016-01-28 DIAGNOSIS — M25512 Pain in left shoulder: Secondary | ICD-10-CM

## 2016-01-28 DIAGNOSIS — M25611 Stiffness of right shoulder, not elsewhere classified: Secondary | ICD-10-CM | POA: Diagnosis not present

## 2016-01-28 DIAGNOSIS — M6281 Muscle weakness (generalized): Secondary | ICD-10-CM | POA: Diagnosis not present

## 2016-01-28 DIAGNOSIS — M25511 Pain in right shoulder: Secondary | ICD-10-CM | POA: Insufficient documentation

## 2016-01-28 DIAGNOSIS — R600 Localized edema: Secondary | ICD-10-CM | POA: Insufficient documentation

## 2016-01-28 DIAGNOSIS — M25612 Stiffness of left shoulder, not elsewhere classified: Secondary | ICD-10-CM | POA: Diagnosis not present

## 2016-01-28 NOTE — Therapy (Signed)
[facility] [address], [place], [place], [address] Phone: [phone]   Fax:  [phone]  Physical Therapy Treatment  Patient Details  Name: Anita Arroyo MRN: [id number] Date of Birth: 25-Aug-1960 Referring Provider: Kathryne Hitch, MD  Encounter Date: 01/28/2016      PT End of Session - 01/28/16 1505    Visit Number 4   Number of Visits 10   Date for PT Re-Evaluation 03/11/16   PT Start Time [id number]   PT Stop Time 1545   PT Time Calculation (min) 60 min   Activity Tolerance Patient tolerated treatment well   Behavior During Therapy [facility] for tasks assessed/performed      Past Medical History:  Diagnosis Date  . ADHD (attention deficit hyperactivity disorder)   . Arthritis   . Asthma   . Bipolar 1 disorder ([place])   . Bipolar 1 disorder ([place])   . Breast cancer ([place])   . Depression   . Dysrhythmia    palpitations  . Dysrhythmia    atrial fibrillation  . Fibromyalgia   . GERD (gastroesophageal reflux disease)   . Heart murmur   . History of stress test 04/28/2011   showed normal perfusion without scar or ischemia  . Hx of echocardiogram 04/28/2011   showed normal systolic function with mild diastolic dysfunction,she had trace MR but did not have frank mitral valve prolapse demonstrated. She had mild pulmonary hypertension with an estimated RV systolic pressure at 34 mm.  . Hyperlipemia   . Meniere disease   . MVP (mitral valve prolapse)   . Neuromuscular disorder ([place])    fibromyalgia    Past Surgical History:  Procedure Laterality Date  . ANKLE SURGERY     left x4  . BREAST SURGERY  2011   lumpectomy right breast   . COLONOSCOPY WITH PROPOFOL N/A 02/25/2014   Procedure: COLONOSCOPY WITH PROPOFOL;  Surgeon: Garlan Fair, MD;  Location: [facility];  Service: Endoscopy;  Laterality: N/A;  . deviated septum    . ECTOPIC PREGNANCY SURGERY    . ELBOW SURGERY     right elbow  . EYE SURGERY     cataract surgery  . fibroid     2-3 fibroid adenomas removed  . JOINT REPLACEMENT  2011   right knee   . KNEE SURGERY     left knee  . SHOULDER ARTHROSCOPY WITH BICEPSTENOTOMY Right 09/25/2015   Procedure: SHOULDER ARTHROSCOPY WITH BICEPSTENOTOMY;  Surgeon: Ninetta Lights, MD;  Location: [place];  Service: Orthopedics;  Laterality: Right;  . SHOULDER ARTHROSCOPY WITH DISTAL CLAVICLE RESECTION Right 09/25/2015   Procedure: SHOULDER ARTHROSCOPY WITH DISTAL CLAVICLE RESECTION;  Surgeon: Ninetta Lights, MD;  Location: [place];  Service: Orthopedics;  Laterality: Right;  . SHOULDER ARTHROSCOPY WITH SUBACROMIAL DECOMPRESSION Right 09/25/2015   Procedure: RIGHT SHOULDER ARTHROSCOPY DEBRIDEMENT,ACROMIOPLASTY,DISTAL CLAVICAL EXCISION, RELEASE OF BICEPS TENDON;  Surgeon: Ninetta Lights, MD;  Location: [place];  Service: Orthopedics;  Laterality: Right;  . TMJ ARTHROPLASTY    . TONSILLECTOMY    . TOTAL SHOULDER ARTHROPLASTY Left 01/05/2016  . UTERINE FIBROID SURGERY     polups and fibroids removed   . WRIST SURGERY     left    There were no vitals filed for this visit.      Subjective Assessment - 01/28/16 1451    Subjective Pt reports both shoulders hurting very badly today. Rt shoulder hurting worse than Lt. Reports 9/10  on Lt with movements.  Also concerned about possible "bed sore" over buttocks.    Pertinent History Rt shoulder manipulation and Lt total shoulder replacement: 01/05/16.  Follow post-op protocol   Limitations House hold activities   How long can you walk comfortably? limited use of Rt UE: 50% of normal   Patient Stated Goals improve A/ROM, P/ROM of Rt and Lt shoulder, use Lt UE   Currently in Pain? Yes   Pain Score 4   Rt 8; Lt increased pain with movement   Pain Location Shoulder   Pain Orientation Right;Left   Pain Descriptors / Indicators Aching;Sore   Pain Type Surgical pain   Pain Onset 1 to 4 weeks ago   Pain  Frequency Constant   Aggravating Factors  movement, sleeping, daily activities   Pain Relieving Factors muscle relaxer, ice, rest   Multiple Pain Sites No                         [facility] Adult PT Treatment/Exercise - 01/28/16 0001      Shoulder Exercises: Seated   Flexion AAROM  UE ranger on floor     Shoulder Exercises: Pulleys   Flexion 2 minutes  Very limited by pain     Hand Exercises   Digiticizer red on Lt x 2 minutes     Moist Heat Therapy   Number Minutes Moist Heat 15 Minutes   Moist Heat Location Shoulder  Rt     Cryotherapy   Number Minutes Cryotherapy 15 Minutes   Cryotherapy Location Shoulder  Lt   Type of Cryotherapy Ice pack     Electrical Stimulation   Electrical Stimulation Location Lt shoulder seated   Electrical Stimulation Action IFC   Electrical Stimulation Parameters To tolerance   Electrical Stimulation Goals Edema;Pain     Manual Therapy   Manual Therapy Soft tissue mobilization;Passive ROM   Passive ROM Lt shoulder flexion to 90, ER within protocol limits and to pt tolerance                  PT Short Term Goals - 01/28/16 1506      PT SHORT TERM GOAL #1   Title be independent in initial HEP   Time 4   Period Weeks   Status On-going     PT SHORT TERM GOAL #2   Title demonstrate Rt shoulder AROM flexion to > or = to 120 degrees to improve reacing overhead   Time 4   Period Weeks   Status On-going     PT SHORT TERM GOAL #3   Title demonstrate Lt shoulder PROM flexion to 90 degrees to improve mobility and allow for use as able   Time 4   Period Weeks   Status On-going     PT SHORT TERM GOAL #4   Title demonstrate 4+/5 Rt shoulder strength to improve use   Time 4   Period Days   Status On-going     PT SHORT TERM GOAL #5   Title report > or = to 50% use of Rt UE with self-care and ADLs   Time 4   Period Weeks   Status On-going     PT SHORT TERM GOAL #6   Title report < or = to 5/10 Lt shoulder pain  with daily activity   Time 4   Period Weeks   Status On-going           PT Long Term Goals -  01/28/16 1507      PT LONG TERM GOAL #1   Title be independent in advanced HEP   Time 8   Period Weeks   Status On-going     PT LONG TERM GOAL #2   Title reduce FOTO to < or = to 55% limitation   Time 8   Period Weeks   Status On-going     PT LONG TERM GOAL #3   Title demonstrate Lt shoulder P/ROM flexion to > or = to 110 degrees to improve use   Time 8   Period Weeks   Status On-going     PT LONG TERM GOAL #4   Title demonstrate Lt shoulder P/ROM IR/ER to > or = to 45 degrees to improve use   Time 8   Period Days   Status On-going     PT LONG TERM GOAL #5   Title demonstrate Rt shoulder IR to L2 to improve use with self-care   Time 8   Period Weeks   Status On-going     PT LONG TERM GOAL #6   Title report < or = to 4/10 Rt and Lt shoulder pain with daily use   Time 8   Status On-going               Plan - 01/28/16 1554    Clinical Impression Statement Pt presents with increased Bil shoulder pain today. Pt atempted table slide stretches but pt reports too painful, able to tolerate UE ranger on floor. Pt able to reach 90 shoulder flexion with PROM with mild discomfort at end range. Pt will continue to benefit from skilled therapy for shoulder ROM and UE strengthening.    Rehab Potential Good   PT Frequency 2x / week   PT Duration 8 weeks   PT Treatment/Interventions ADLs/Self Care Home Management;Electrical Stimulation;Cryotherapy;Functional mobility training;Moist Heat;Therapeutic activities;Therapeutic exercise;Neuromuscular re-education;Patient/family education;Passive range of motion;Scar mobilization;Manual techniques;Vasopneumatic Device;Taping   PT Next Visit Plan Rt shoulder A/ROM and strength progression as tolerated, follow post-op protocol for Lt shoulder, pain and edema management.     Consulted and Agree with Plan of Care Patient      Patient will  benefit from skilled therapeutic intervention in order to improve the following deficits and impairments:  Decreased range of motion, Pain, Increased muscle spasms, Decreased endurance, Decreased activity tolerance, Postural dysfunction, Increased edema, Decreased strength, Impaired flexibility, Decreased scar mobility, Impaired UE functional use  Visit Diagnosis: Acute pain of left shoulder  Stiffness of left shoulder, not elsewhere classified  Localized edema     Problem List Patient Active Problem List   Diagnosis Date Noted  . History of palpitations 09/16/2015  . Pre-operative cardiovascular examination 09/16/2015  . Bipolar 1 disorder ([place]) 09/16/2015  . Palpitations 08/28/2014  . Hyperlipidemia LDL goal <70 11/26/2013  . History of breast cancer 11/16/2012  . Fibromyalgia 11/16/2012  . History of tobacco abuse 11/16/2012  . MRSA 04/13/2010  . INTERTRIGO 04/13/2010  . GROUP B STREPTOCOCCUS INFECTION, HX OF 04/13/2010  . HYPERLIPIDEMIA 09/02/2006  . DEPRESSION 09/02/2006  . Essential hypertension 09/02/2006  . COPD 09/02/2006  . GERD 09/02/2006    Anita Arroyo PTA 01/28/2016, 4:49 PM  [facility] [address], [place], [place], [address] Phone: [phone]   Fax:  [phone]  Name: Anita Arroyo MRN: [id number] Date of Birth: Jul 29, 1960

## 2016-02-02 ENCOUNTER — Encounter: Payer: Self-pay | Admitting: Physical Therapy

## 2016-02-02 ENCOUNTER — Ambulatory Visit: Payer: Commercial Managed Care - HMO | Admitting: Physical Therapy

## 2016-02-02 DIAGNOSIS — M25612 Stiffness of left shoulder, not elsewhere classified: Secondary | ICD-10-CM

## 2016-02-02 DIAGNOSIS — M6281 Muscle weakness (generalized): Secondary | ICD-10-CM

## 2016-02-02 DIAGNOSIS — M25512 Pain in left shoulder: Secondary | ICD-10-CM

## 2016-02-02 DIAGNOSIS — M25611 Stiffness of right shoulder, not elsewhere classified: Secondary | ICD-10-CM | POA: Diagnosis not present

## 2016-02-02 DIAGNOSIS — M25511 Pain in right shoulder: Secondary | ICD-10-CM

## 2016-02-02 DIAGNOSIS — R600 Localized edema: Secondary | ICD-10-CM | POA: Diagnosis not present

## 2016-02-02 NOTE — Therapy (Signed)
[facility] [address], [address] [place], [place], [address] Phone: [phone]   Fax:  [phone]  Physical Therapy Treatment  Patient Details  Name: Anita Arroyo MRN: [id number] Date of Birth: [birth year] Referring Provider: Mckinley Jewel, MD  Encounter Date: 02/02/2016      PT End of Session - 02/02/16 1154    Visit Number 5   Number of Visits 10   Date for PT Re-Evaluation 03/11/16   PT Start Time 1150   PT Stop Time 1245   PT Time Calculation (min) 55 min   Activity Tolerance Patient tolerated treatment well   Behavior During Therapy [facility] for tasks assessed/performed      Past Medical History:  Diagnosis Date  . ADHD (attention deficit hyperactivity disorder)   . Arthritis   . Asthma   . Bipolar 1 disorder (HCC)   . Bipolar 1 disorder (HCC)   . Breast cancer (HCC)   . Depression   . Dysrhythmia    palpitations  . Dysrhythmia    atrial fibrillation  . Fibromyalgia   . GERD (gastroesophageal reflux disease)   . Heart murmur   . History of stress test 04/28/2011   showed normal perfusion without scar or ischemia  . Hx of echocardiogram 04/28/2011   showed normal systolic function with mild diastolic dysfunction,she had trace MR but did not have frank mitral valve prolapse demonstrated. She had mild pulmonary hypertension with an estimated RV systolic pressure at 34 mm.  . Hyperlipemia   . Meniere disease   . MVP (mitral valve prolapse)   . Neuromuscular disorder (HCC)    fibromyalgia    Past Surgical History:  Procedure Laterality Date  . ANKLE SURGERY     left x4  . BREAST SURGERY  2011   lumpectomy right breast   . COLONOSCOPY WITH PROPOFOL N/A 02/25/2014   Procedure: COLONOSCOPY WITH PROPOFOL;  Surgeon: Charolett Bumpers, MD;  Location: [facility];  Service: Endoscopy;  Laterality: N/A;  . deviated septum    . ECTOPIC PREGNANCY SURGERY    . ELBOW SURGERY     right elbow  . EYE SURGERY     cataract surgery  . fibroid     2-3 fibroid adenomas removed  . JOINT REPLACEMENT  2011   right knee   . KNEE SURGERY     left knee  . SHOULDER ARTHROSCOPY WITH BICEPSTENOTOMY Right 09/25/2015   Procedure: SHOULDER ARTHROSCOPY WITH BICEPSTENOTOMY;  Surgeon: Loreta Ave, MD;  Location: [facility];  Service: Orthopedics;  Laterality: Right;  . SHOULDER ARTHROSCOPY WITH DISTAL CLAVICLE RESECTION Right 09/25/2015   Procedure: SHOULDER ARTHROSCOPY WITH DISTAL CLAVICLE RESECTION;  Surgeon: Loreta Ave, MD;  Location: [facility];  Service: Orthopedics;  Laterality: Right;  . SHOULDER ARTHROSCOPY WITH SUBACROMIAL DECOMPRESSION Right 09/25/2015   Procedure: RIGHT SHOULDER ARTHROSCOPY DEBRIDEMENT,ACROMIOPLASTY,DISTAL CLAVICAL EXCISION, RELEASE OF BICEPS TENDON;  Surgeon: Loreta Ave, MD;  Location: [facility];  Service: Orthopedics;  Laterality: Right;  . TMJ ARTHROPLASTY    . TONSILLECTOMY    . TOTAL SHOULDER ARTHROPLASTY Left 01/05/2016  . UTERINE FIBROID SURGERY     polups and fibroids removed   . WRIST SURGERY     left    There were no vitals filed for this visit.      Subjective Assessment - 02/02/16 1154    Subjective Difficulty sleeping, continues to have high pain levels.   Currently in Pain? --  RT  shoulder: at rest 2/10, use 8/10: LT at rest  5/10,    Aggravating Factors  Use   Pain Relieving Factors Meds, ice                         [facility] Adult PT Treatment/Exercise - 02/02/16 0001      Shoulder Exercises: Supine   Flexion --  Cane assisted: 4x with elbow flexion/extenson   Other Supine Exercises Scap squeezes x5  painful     Shoulder Exercises: Seated   Row --  RTUE only yellow 10x   Other Seated Exercises UE ranger: flexion 10x     Shoulder Exercises: Pulleys   Flexion 2 minutes  flexion to 90     Moist Heat Therapy   Number Minutes Moist Heat 15 Minutes   Moist Heat Location --  RT  shoulder     Cryotherapy   Number Minutes Cryotherapy 15 Minutes   Cryotherapy Location --  LT shoulder   Type of Cryotherapy Ice pack     Manual Therapy   Passive ROM Lt shoulder flexion to 90, ER within protocol limits and to pt tolerance                  PT Short Term Goals - 01/28/16 1506      PT SHORT TERM GOAL #1   Title be independent in initial HEP   Time 4   Period Weeks   Status On-going     PT SHORT TERM GOAL #2   Title demonstrate Rt shoulder AROM flexion to > or = to 120 degrees to improve reacing overhead   Time 4   Period Weeks   Status On-going     PT SHORT TERM GOAL #3   Title demonstrate Lt shoulder PROM flexion to 90 degrees to improve mobility and allow for use as able   Time 4   Period Weeks   Status On-going     PT SHORT TERM GOAL #4   Title demonstrate 4+/5 Rt shoulder strength to improve use   Time 4   Period Days   Status On-going     PT SHORT TERM GOAL #5   Title report > or = to 50% use of Rt UE with self-care and ADLs   Time 4   Period Weeks   Status On-going     PT SHORT TERM GOAL #6   Title report < or = to 5/10 Lt shoulder pain with daily activity   Time 4   Period Weeks   Status On-going           PT Long Term Goals - 01/28/16 1507      PT LONG TERM GOAL #1   Title be independent in advanced HEP   Time 8   Period Weeks   Status On-going     PT LONG TERM GOAL #2   Title reduce FOTO to < or = to 55% limitation   Time 8   Period Weeks   Status On-going     PT LONG TERM GOAL #3   Title demonstrate Lt shoulder P/ROM flexion to > or = to 110 degrees to improve use   Time 8   Period Weeks   Status On-going     PT LONG TERM GOAL #4   Title demonstrate Lt shoulder P/ROM IR/ER to > or = to 45 degrees to improve use   Time 8   Period Days   Status On-going  PT LONG TERM GOAL #5   Title demonstrate Rt shoulder IR to L2 to improve use with self-care   Time 8   Period Weeks   Status On-going     PT  LONG TERM GOAL #6   Title report < or = to 4/10 Rt and Lt shoulder pain with daily use   Time 8   Status On-going               Plan - 02/02/16 1608    Clinical Impression Statement Continues with pain. When asked about the pain in the RT shoulder pt was able to demonstrate reaching fairly far behind her to show when she had pain. Pt guarded with all AA movements, facial grimacing and body contorsions due to pain. She reported the pulleys felt the best.    PT Next Visit Plan Rt shoulder A/ROM and strength progression as tolerated, follow post-op protocol for Lt shoulder, pain and edema management.        Patient will benefit from skilled therapeutic intervention in order to improve the following deficits and impairments:     Visit Diagnosis: Acute pain of left shoulder  Stiffness of left shoulder, not elsewhere classified  Muscle weakness (generalized)  Acute pain of right shoulder  Stiffness of right shoulder, not elsewhere classified     Problem List Patient Active Problem List   Diagnosis Date Noted  . History of palpitations 09/16/2015  . Pre-operative cardiovascular examination 09/16/2015  . Bipolar 1 disorder (HCC) 09/16/2015  . Palpitations 08/28/2014  . Hyperlipidemia LDL goal <70 11/26/2013  . History of breast cancer 11/16/2012  . Fibromyalgia 11/16/2012  . History of tobacco abuse 11/16/2012  . MRSA 04/13/2010  . INTERTRIGO 04/13/2010  . GROUP B STREPTOCOCCUS INFECTION, HX OF 04/13/2010  . HYPERLIPIDEMIA 09/02/2006  . DEPRESSION 09/02/2006  . Essential hypertension 09/02/2006  . COPD 09/02/2006  . GERD 09/02/2006    Mihcael Ledee, PTA 02/02/2016, 4:13 PM  [facility] [address], [address] [place], [place], [address] Phone: [phone]   Fax:  [phone]  Name: ABBAGAIL HILINSKI MRN: [id number] Date of Birth: Sep 27, 1960

## 2016-02-04 ENCOUNTER — Encounter: Payer: Self-pay | Admitting: Physical Therapy

## 2016-02-04 ENCOUNTER — Ambulatory Visit: Payer: Commercial Managed Care - HMO | Admitting: Physical Therapy

## 2016-02-04 DIAGNOSIS — M25511 Pain in right shoulder: Secondary | ICD-10-CM

## 2016-02-04 DIAGNOSIS — M25512 Pain in left shoulder: Secondary | ICD-10-CM | POA: Diagnosis not present

## 2016-02-04 DIAGNOSIS — R600 Localized edema: Secondary | ICD-10-CM

## 2016-02-04 DIAGNOSIS — M25611 Stiffness of right shoulder, not elsewhere classified: Secondary | ICD-10-CM

## 2016-02-04 DIAGNOSIS — M25612 Stiffness of left shoulder, not elsewhere classified: Secondary | ICD-10-CM

## 2016-02-04 DIAGNOSIS — M6281 Muscle weakness (generalized): Secondary | ICD-10-CM | POA: Diagnosis not present

## 2016-02-04 NOTE — Therapy (Signed)
[facility] [address], [address] [place], [place], [address] Phone: [phone]   Fax:  [phone]  Physical Therapy Treatment  Patient Details  Name: Anita Arroyo MRN: [id number] Date of Birth: 11-21-1960 Referring Provider: Mckinley Jewel, MD  Encounter Date: 02/04/2016      PT End of Session - 02/04/16 1241    Visit Number 6   Number of Visits 10   Date for PT Re-Evaluation 03/11/16   PT Start Time 1239  10 min late   PT Stop Time 1324   PT Time Calculation (min) 45 min   Activity Tolerance Patient tolerated treatment well   Behavior During Therapy [facility] for tasks assessed/performed      Past Medical History:  Diagnosis Date  . ADHD (attention deficit hyperactivity disorder)   . Arthritis   . Asthma   . Bipolar 1 disorder (HCC)   . Bipolar 1 disorder (HCC)   . Breast cancer (HCC)   . Depression   . Dysrhythmia    palpitations  . Dysrhythmia    atrial fibrillation  . Fibromyalgia   . GERD (gastroesophageal reflux disease)   . Heart murmur   . History of stress test 04/28/2011   showed normal perfusion without scar or ischemia  . Hx of echocardiogram 04/28/2011   showed normal systolic function with mild diastolic dysfunction,she had trace MR but did not have frank mitral valve prolapse demonstrated. She had mild pulmonary hypertension with an estimated RV systolic pressure at 34 mm.  . Hyperlipemia   . Meniere disease   . MVP (mitral valve prolapse)   . Neuromuscular disorder (HCC)    fibromyalgia    Past Surgical History:  Procedure Laterality Date  . ANKLE SURGERY     left x4  . BREAST SURGERY  2011   lumpectomy right breast   . COLONOSCOPY WITH PROPOFOL N/A 02/25/2014   Procedure: COLONOSCOPY WITH PROPOFOL;  Surgeon: Charolett Bumpers, MD;  Location: [facility];  Service: Endoscopy;  Laterality: N/A;  . deviated septum    . ECTOPIC PREGNANCY SURGERY    . ELBOW SURGERY     right elbow  . EYE  SURGERY     cataract surgery  . fibroid     2-3 fibroid adenomas removed  . JOINT REPLACEMENT  2011   right knee   . KNEE SURGERY     left knee  . SHOULDER ARTHROSCOPY WITH BICEPSTENOTOMY Right 09/25/2015   Procedure: SHOULDER ARTHROSCOPY WITH BICEPSTENOTOMY;  Surgeon: Loreta Ave, MD;  Location: [facility];  Service: Orthopedics;  Laterality: Right;  . SHOULDER ARTHROSCOPY WITH DISTAL CLAVICLE RESECTION Right 09/25/2015   Procedure: SHOULDER ARTHROSCOPY WITH DISTAL CLAVICLE RESECTION;  Surgeon: Loreta Ave, MD;  Location: [facility];  Service: Orthopedics;  Laterality: Right;  . SHOULDER ARTHROSCOPY WITH SUBACROMIAL DECOMPRESSION Right 09/25/2015   Procedure: RIGHT SHOULDER ARTHROSCOPY DEBRIDEMENT,ACROMIOPLASTY,DISTAL CLAVICAL EXCISION, RELEASE OF BICEPS TENDON;  Surgeon: Loreta Ave, MD;  Location:  [facility];  Service: Orthopedics;  Laterality: Right;  . TMJ ARTHROPLASTY    . TONSILLECTOMY    . TOTAL SHOULDER ARTHROPLASTY Left 01/05/2016  . UTERINE FIBROID SURGERY     polups and fibroids removed   . WRIST SURGERY     left    There were no vitals filed for this visit.      Subjective Assessment - 02/04/16 1240    Subjective My Lt arm doesn't hurt as bad when I put my  shirts on.   Currently in Pain? Yes  RT normal ADL 4/10. LT 4.5/10 at rest.   Pain Location Shoulder   Pain Orientation Right;Left   Pain Descriptors / Indicators Aching;Sore   Multiple Pain Sites No                         [facility] Adult PT Treatment/Exercise - 02/04/16 0001      Shoulder Exercises: Supine   External Rotation AAROM;Left;10 reps  Used cane   Flexion --  Cane assisted to 90 degrees 4x2, then 1# RTUE 2x10     Shoulder Exercises: Seated   Other Seated Exercises Green digiflex Bil 10x  UE ranger circles 10 x each direction   Other Seated Exercises Biceps: RT 3# 15x, LT 1# 15x     Shoulder Exercises: Standing   Row --   RTUE 2x10     Shoulder Exercises: Pulleys   Flexion 3 minutes     Moist Heat Therapy   Number Minutes Moist Heat 10 Minutes  Concuurent with LT Shld PROM   Moist Heat Location --  RT shoulder     Cryotherapy   Number Minutes Cryotherapy 10 Minutes   Cryotherapy Location --  LT shoulder   Type of Cryotherapy Ice pack     Manual Therapy   Passive ROM Lt shoulder flexion to 90, ER within protocol limits and to pt tolerance  MHP to RT shld concurrent with PROM                  PT Short Term Goals - 01/28/16 1506      PT SHORT TERM GOAL #1   Title be independent in initial HEP   Time 4   Period Weeks   Status On-going     PT SHORT TERM GOAL #2   Title demonstrate Rt shoulder AROM flexion to > or = to 120 degrees to improve reacing overhead   Time 4   Period Weeks   Status On-going     PT SHORT TERM GOAL #3   Title demonstrate Lt shoulder PROM flexion to 90 degrees to improve mobility and allow for use as able   Time 4   Period Weeks   Status On-going     PT SHORT TERM GOAL #4   Title demonstrate 4+/5 Rt shoulder strength to improve use   Time 4   Period Days   Status On-going     PT SHORT TERM GOAL #5   Title report > or = to 50% use of Rt UE with self-care and ADLs   Time 4   Period Weeks   Status On-going     PT SHORT TERM GOAL #6   Title report < or = to 5/10 Lt shoulder pain with daily activity   Time 4   Period Weeks   Status On-going           PT Long Term Goals - 01/28/16 1507      PT LONG TERM GOAL #1   Title be independent in advanced HEP   Time 8   Period Weeks   Status On-going     PT LONG TERM GOAL #2   Title reduce FOTO to < or = to 55% limitation   Time 8   Period Weeks   Status On-going     PT LONG TERM GOAL #3   Title demonstrate Lt shoulder P/ROM flexion to > or = to 110 degrees to  improve use   Time 8   Period Weeks   Status On-going     PT LONG TERM GOAL #4   Title demonstrate Lt shoulder P/ROM IR/ER to >  or = to 45 degrees to improve use   Time 8   Period Days   Status On-going     PT LONG TERM GOAL #5   Title demonstrate Rt shoulder IR to L2 to improve use with self-care   Time 8   Period Weeks   Status On-going     PT LONG TERM GOAL #6   Title report < or = to 4/10 Rt and Lt shoulder pain with daily use   Time 8   Status On-going               Plan - 02/04/16 1315    Clinical Impression Statement Pt tolerated all exercises much better today, pain did not limit any treatment today. Pt will add rows with yellow band to HEP at this time.  Glenohumeral movement with shoulder flexion with the LT appeared bettered, the humeral head seemed to roll better posteriorly.    PT Next Visit Plan Rt shoulder A/ROM and strength progression as tolerated, follow post-op protocol for Lt shoulder.      Patient will benefit from skilled therapeutic intervention in order to improve the following deficits and impairments:  Decreased range of motion, Pain, Increased muscle spasms, Decreased endurance, Decreased activity tolerance, Postural dysfunction, Increased edema, Decreased strength, Impaired flexibility, Decreased scar mobility, Impaired UE functional use  Visit Diagnosis: Acute pain of left shoulder  Stiffness of left shoulder, not elsewhere classified  Muscle weakness (generalized)  Stiffness of right shoulder, not elsewhere classified  Acute pain of right shoulder  Localized edema     Problem List Patient Active Problem List   Diagnosis Date Noted  . History of palpitations 09/16/2015  . Pre-operative cardiovascular examination 09/16/2015  . Bipolar 1 disorder (HCC) 09/16/2015  . Palpitations 08/28/2014  . Hyperlipidemia LDL goal <70 11/26/2013  . History of breast cancer 11/16/2012  . Fibromyalgia 11/16/2012  . History of tobacco abuse 11/16/2012  . MRSA 04/13/2010  . INTERTRIGO 04/13/2010  . GROUP B STREPTOCOCCUS INFECTION, HX OF 04/13/2010  . HYPERLIPIDEMIA  09/02/2006  . DEPRESSION 09/02/2006  . Essential hypertension 09/02/2006  . COPD 09/02/2006  . GERD 09/02/2006    Cordera Stineman, PTA 02/04/2016, 1:18 PM  [facility] [address] [place], [place], [address] Phone: [phone]   Fax:  [phone]  Name: OLUWADARASIMI STEARNS MRN: [id number] Date of Birth: [birth year]

## 2016-02-09 ENCOUNTER — Ambulatory Visit: Payer: Commercial Managed Care - HMO

## 2016-02-09 DIAGNOSIS — M25512 Pain in left shoulder: Secondary | ICD-10-CM | POA: Diagnosis not present

## 2016-02-09 DIAGNOSIS — M6281 Muscle weakness (generalized): Secondary | ICD-10-CM | POA: Diagnosis not present

## 2016-02-09 DIAGNOSIS — R600 Localized edema: Secondary | ICD-10-CM

## 2016-02-09 DIAGNOSIS — M25511 Pain in right shoulder: Secondary | ICD-10-CM

## 2016-02-09 DIAGNOSIS — M25612 Stiffness of left shoulder, not elsewhere classified: Secondary | ICD-10-CM | POA: Diagnosis not present

## 2016-02-09 DIAGNOSIS — M25611 Stiffness of right shoulder, not elsewhere classified: Secondary | ICD-10-CM | POA: Diagnosis not present

## 2016-02-09 NOTE — Patient Instructions (Addendum)
Strengthening: Resisted Internal Rotation   Hold tubing in left hand, elbow at side and forearm out. Rotate forearm in across body. Repeat _10___ times per set. Do _2___ sets per session. Do __2__ sessions per day.   Cane Exercise: Flexion    Lie on back, holding cane above chest. Keeping arms as straight as possible, lower cane toward floor beyond head. Hold _5___ seconds. Repeat __10__ times. Do __2__ sessions per day.  [email or web address].exer.[email or web address]   Copyright  VHI. All rights reserved.   [facility] [address] [place], [place] [address] Phone # [phone] Fax [phone]

## 2016-02-09 NOTE — Therapy (Signed)
[facility] [address] [place], [place], [address] Phone: [phone]   Fax:  [phone]  Physical Therapy Treatment  Patient Details  Name: Anita Arroyo MRN: [id number] Date of Birth: Mar 24, 1960 Referring Provider: Kathryne Hitch, MD  Encounter Date: 02/09/2016      PT End of Session - 02/09/16 1144    Visit Number 7   Number of Visits 10   Date for PT Re-Evaluation 03/11/16   PT Start Time 1101   PT Stop Time 1158   PT Time Calculation (min) 57 min   Activity Tolerance Patient tolerated treatment well   Behavior During Therapy [facility] for tasks assessed/performed      Past Medical History:  Diagnosis Date  . ADHD (attention deficit hyperactivity disorder)   . Arthritis   . Asthma   . Bipolar 1 disorder ([place])   . Bipolar 1 disorder ([place])   . Breast cancer ([place])   . Depression   . Dysrhythmia    palpitations  . Dysrhythmia    atrial fibrillation  . Fibromyalgia   . GERD (gastroesophageal reflux disease)   . Heart murmur   . History of stress test 04/28/2011   showed normal perfusion without scar or ischemia  . Hx of echocardiogram 04/28/2011   showed normal systolic function with mild diastolic dysfunction,she had trace MR but did not have frank mitral valve prolapse demonstrated. She had mild pulmonary hypertension with an estimated RV systolic pressure at 34 mm.  . Hyperlipemia   . Meniere disease   . MVP (mitral valve prolapse)   . Neuromuscular disorder ([place])    fibromyalgia    Past Surgical History:  Procedure Laterality Date  . ANKLE SURGERY     left x4  . BREAST SURGERY  2011   lumpectomy right breast   . COLONOSCOPY WITH PROPOFOL N/A 02/25/2014   Procedure: COLONOSCOPY WITH PROPOFOL;  Surgeon: Garlan Fair, MD;  Location: [facility];  Service: Endoscopy;  Laterality: N/A;  . deviated septum    . ECTOPIC PREGNANCY SURGERY    . ELBOW SURGERY     right elbow  . EYE SURGERY     cataract surgery  . fibroid     2-3 fibroid adenomas removed  . JOINT REPLACEMENT  2011   right knee   . KNEE SURGERY     left knee  . SHOULDER ARTHROSCOPY WITH BICEPSTENOTOMY Right 09/25/2015   Procedure: SHOULDER ARTHROSCOPY WITH BICEPSTENOTOMY;  Surgeon: Ninetta Lights, MD;  Location: [place];  Service: Orthopedics;  Laterality: Right;  . SHOULDER ARTHROSCOPY WITH DISTAL CLAVICLE RESECTION Right 09/25/2015   Procedure: SHOULDER ARTHROSCOPY WITH DISTAL CLAVICLE RESECTION;  Surgeon: Ninetta Lights, MD;  Location: [place];  Service: Orthopedics;  Laterality: Right;  . SHOULDER ARTHROSCOPY WITH SUBACROMIAL DECOMPRESSION Right 09/25/2015   Procedure: RIGHT SHOULDER ARTHROSCOPY DEBRIDEMENT,ACROMIOPLASTY,DISTAL CLAVICAL EXCISION, RELEASE OF BICEPS TENDON;  Surgeon: Ninetta Lights, MD;  Location: [place];  Service: Orthopedics;  Laterality: Right;  . TMJ ARTHROPLASTY    . TONSILLECTOMY    . TOTAL SHOULDER ARTHROPLASTY Left 01/05/2016  . UTERINE FIBROID SURGERY     polups and fibroids removed   . WRIST SURGERY     left    There were no vitals filed for this visit.      Subjective Assessment - 02/09/16 1104    Subjective I have been doing my exercises.   Pertinent History Rt shoulder manipulation and Lt total  shoulder replacement: 01/05/16.  Follow post-op protocol   Patient Stated Goals improve A/ROM, P/ROM of Rt and Lt shoulder, use Lt UE   Currently in Pain? Yes   Pain Score 4   Rt 4/10, 8/10 Lt   Pain Location Shoulder   Pain Orientation Right;Left   Pain Descriptors / Indicators Aching;Sore   Pain Type Surgical pain   Pain Onset 1 to 4 weeks ago   Pain Frequency Constant   Aggravating Factors  use, sometimes just hurts   Pain Relieving Factors medication, ice, rest            [facility] PT Assessment - 02/09/16 0001      ROM / Strength   AROM / PROM / Strength AROM;PROM;Strength     AROM   AROM Assessment Site  Shoulder   Right/Left Shoulder Right;Left   Right Shoulder Flexion 132 Degrees   Left Shoulder Flexion 75 Degrees                     [facility] Adult PT Treatment/Exercise - 02/09/16 0001      Shoulder Exercises: Supine   Flexion Strengthening;10 reps;Both  supine with cane     Shoulder Exercises: Seated   Other Seated Exercises Green digiflex Lt x 3 minutes  UE ranger circles 10 x each direction   Other Seated Exercises Biceps: RT 3# 15x, LT 1# 15x     Shoulder Exercises: Standing   Flexion AAROM;Right;20 reps  using finger ladder     Shoulder Exercises: Pulleys   Flexion 3 minutes     Moist Heat Therapy   Number Minutes Moist Heat 10 Minutes   Moist Heat Location Shoulder  Rt     Cryotherapy   Number Minutes Cryotherapy 10 Minutes   Cryotherapy Location Shoulder  Lt   Type of Cryotherapy Ice pack     Manual Therapy   Passive ROM Lt shoulder flexion to 90, ER within protocol limits and to pt tolerance                PT Education - 02/09/16 1121    Education provided Yes   Education Details yellow theraband IR and cane flexion in supine   Person(s) Educated Patient   Methods Explanation;Demonstration   Comprehension Verbalized understanding;Returned demonstration          PT Short Term Goals - 02/09/16 1108      PT SHORT TERM GOAL #1   Title be independent in initial HEP   Status Achieved     PT SHORT TERM GOAL #2   Title demonstrate Rt shoulder AROM flexion to > or = to 120 degrees to improve reacing overhead   Status Achieved     PT SHORT TERM GOAL #3   Title demonstrate Lt shoulder PROM flexion to 90 degrees to improve mobility and allow for use as able   Time 4   Period Weeks   Status On-going     PT SHORT TERM GOAL #5   Title report > or = to 50% use of Rt UE with self-care and ADLs   Status Achieved     PT SHORT TERM GOAL #6   Title report < or = to 5/10 Lt shoulder pain with daily activity   Time 4   Period Weeks    Status On-going           PT Long Term Goals - 01/28/16 1507      PT LONG TERM GOAL #1  Title be independent in advanced HEP   Time 8   Period Weeks   Status On-going     PT LONG TERM GOAL #2   Title reduce FOTO to < or = to 55% limitation   Time 8   Period Weeks   Status On-going     PT LONG TERM GOAL #3   Title demonstrate Lt shoulder P/ROM flexion to > or = to 110 degrees to improve use   Time 8   Period Weeks   Status On-going     PT LONG TERM GOAL #4   Title demonstrate Lt shoulder P/ROM IR/ER to > or = to 45 degrees to improve use   Time 8   Period Days   Status On-going     PT LONG TERM GOAL #5   Title demonstrate Rt shoulder IR to L2 to improve use with self-care   Time 8   Period Weeks   Status On-going     PT LONG TERM GOAL #6   Title report < or = to 4/10 Rt and Lt shoulder pain with daily use   Time 8   Status On-going               Plan - 02/09/16 1114    Clinical Impression Statement Pt tolerated advancement of protocol exercises today.  Added yellow theraband for IR of Rt and Lt and A/ROM with cane in supine.  Pt reports varying levels of Lt arm pain with daily activity.  Pt will continue to benefit from skilled PT for advancement of protocol for safe progression of Lt shoulder strength and motion and to return Rt arm to prior level of function.     Rehab Potential Good   PT Frequency 2x / week   PT Duration 8 weeks   PT Treatment/Interventions ADLs/Self Care Home Management;Electrical Stimulation;Cryotherapy;Functional mobility training;Moist Heat;Therapeutic activities;Therapeutic exercise;Neuromuscular re-education;Patient/family education;Passive range of motion;Scar mobilization;Manual techniques;Vasopneumatic Device;Taping   PT Next Visit Plan Rt shoulder A/ROM and strength progression as tolerated, follow post-op protocol for Lt shoulder.   Consulted and Agree with Plan of Care Patient      Patient will benefit from skilled  therapeutic intervention in order to improve the following deficits and impairments:  Decreased range of motion, Pain, Increased muscle spasms, Decreased endurance, Decreased activity tolerance, Postural dysfunction, Increased edema, Decreased strength, Impaired flexibility, Decreased scar mobility, Impaired UE functional use  Visit Diagnosis: Acute pain of left shoulder  Stiffness of left shoulder, not elsewhere classified  Muscle weakness (generalized)  Stiffness of right shoulder, not elsewhere classified  Acute pain of right shoulder  Localized edema     Problem List Patient Active Problem List   Diagnosis Date Noted  . History of palpitations 09/16/2015  . Pre-operative cardiovascular examination 09/16/2015  . Bipolar 1 disorder ([place]) 09/16/2015  . Palpitations 08/28/2014  . Hyperlipidemia LDL goal <70 11/26/2013  . History of breast cancer 11/16/2012  . Fibromyalgia 11/16/2012  . History of tobacco abuse 11/16/2012  . MRSA 04/13/2010  . INTERTRIGO 04/13/2010  . GROUP B STREPTOCOCCUS INFECTION, HX OF 04/13/2010  . HYPERLIPIDEMIA 09/02/2006  . DEPRESSION 09/02/2006  . Essential hypertension 09/02/2006  . COPD 09/02/2006  . GERD 09/02/2006     Sigurd Sos, PT 02/09/16 11:46 AM  [facility] [address], [place], [place], [address] Phone: [phone]   Fax:  [phone]  Name: Anita Arroyo MRN: [id number] Date of Birth: 12/03/1960

## 2016-02-11 ENCOUNTER — Encounter: Payer: Self-pay | Admitting: Physical Therapy

## 2016-02-12 ENCOUNTER — Ambulatory Visit (HOSPITAL_COMMUNITY): Payer: Self-pay | Admitting: Psychiatry

## 2016-02-16 ENCOUNTER — Encounter: Payer: Self-pay | Admitting: Physical Therapy

## 2016-02-16 ENCOUNTER — Ambulatory Visit: Payer: Commercial Managed Care - HMO | Admitting: Physical Therapy

## 2016-02-16 DIAGNOSIS — M25512 Pain in left shoulder: Secondary | ICD-10-CM | POA: Diagnosis not present

## 2016-02-16 DIAGNOSIS — M25612 Stiffness of left shoulder, not elsewhere classified: Secondary | ICD-10-CM

## 2016-02-16 DIAGNOSIS — M25611 Stiffness of right shoulder, not elsewhere classified: Secondary | ICD-10-CM | POA: Diagnosis not present

## 2016-02-16 DIAGNOSIS — M6281 Muscle weakness (generalized): Secondary | ICD-10-CM | POA: Diagnosis not present

## 2016-02-16 DIAGNOSIS — R600 Localized edema: Secondary | ICD-10-CM | POA: Diagnosis not present

## 2016-02-16 DIAGNOSIS — M25511 Pain in right shoulder: Secondary | ICD-10-CM | POA: Diagnosis not present

## 2016-02-16 NOTE — Therapy (Signed)
[facility] [address] [place], [place], [address] Phone: [phone]   Fax:  [phone]  Physical Therapy Treatment  Patient Details  Name: Anita Arroyo MRN: [id number] Date of Birth: 1960/05/02 Referring Provider: Mckinley Jewel, MD  Encounter Date: 02/16/2016      PT End of Session - 02/16/16 1108    Visit Number 8   Number of Visits 10   Date for PT Re-Evaluation 03/11/16   PT Start Time 1100   PT Stop Time 1200   PT Time Calculation (min) 60 min   Behavior During Therapy [facility] for tasks assessed/performed      Past Medical History:  Diagnosis Date  . ADHD (attention deficit hyperactivity disorder)   . Arthritis   . Asthma   . Bipolar 1 disorder ([facility])   . Bipolar 1 disorder ([facility])   . Breast cancer ([facility])   . Depression   . Dysrhythmia    palpitations  . Dysrhythmia    atrial fibrillation  . Fibromyalgia   . GERD (gastroesophageal reflux disease)   . Heart murmur   . History of stress test 04/28/2011   showed normal perfusion without scar or ischemia  . Hx of echocardiogram 04/28/2011   showed normal systolic function with mild diastolic dysfunction,she had trace MR but did not have frank mitral valve prolapse demonstrated. She had mild pulmonary hypertension with an estimated RV systolic pressure at 34 mm.  . Hyperlipemia   . Meniere disease   . MVP (mitral valve prolapse)   . Neuromuscular disorder ([facility])    fibromyalgia    Past Surgical History:  Procedure Laterality Date  . ANKLE SURGERY     left x4  . BREAST SURGERY  2011   lumpectomy right breast   . COLONOSCOPY WITH PROPOFOL N/A 02/25/2014   Procedure: COLONOSCOPY WITH PROPOFOL;  Surgeon: Charolett Bumpers, MD;  [facility];  Service: Endoscopy;  Laterality: N/A;  . deviated septum    . ECTOPIC PREGNANCY SURGERY    . ELBOW SURGERY     right elbow  . EYE SURGERY     cataract surgery  . fibroid     2-3 fibroid adenomas  removed  . JOINT REPLACEMENT  2011   right knee   . KNEE SURGERY     left knee  . SHOULDER ARTHROSCOPY WITH BICEPSTENOTOMY Right 09/25/2015   Procedure: SHOULDER ARTHROSCOPY WITH BICEPSTENOTOMY;  Surgeon: Loreta Ave, MD;  Location: [facility];  Service: Orthopedics;  Laterality: Right;  . SHOULDER ARTHROSCOPY WITH DISTAL CLAVICLE RESECTION Right 09/25/2015   Procedure: SHOULDER ARTHROSCOPY WITH DISTAL CLAVICLE RESECTION;  Surgeon: Loreta Ave, MD;  Location: [facility];  Service: Orthopedics;  Laterality: Right;  . SHOULDER ARTHROSCOPY WITH SUBACROMIAL DECOMPRESSION Right 09/25/2015   Procedure: RIGHT SHOULDER ARTHROSCOPY DEBRIDEMENT,ACROMIOPLASTY,DISTAL CLAVICAL EXCISION, RELEASE OF BICEPS TENDON;  Surgeon: Loreta Ave, MD;  Location: [facility];  Service: Orthopedics;  Laterality: Right;  . TMJ ARTHROPLASTY    . TONSILLECTOMY    . TOTAL SHOULDER ARTHROPLASTY Left 01/05/2016  . UTERINE FIBROID SURGERY     polups and fibroids removed   . WRIST SURGERY     left    There were no vitals filed for this visit.      Subjective Assessment - 02/16/16 1106    Subjective I had to RS my MD appt this week. Shoulders have been doing pretty good.    Currently in Pain? Yes  RT; 3/10 LT 5/10   Pain Location Shoulder   Pain Orientation Right;Left   Pain Descriptors / Indicators Dull;Aching   Aggravating Factors  Use, at night   Pain Relieving Factors Meds, ice, just depends on the day            [facility] PT Assessment - 02/16/16 0001      PROM   Left Shoulder Flexion 108 Degrees                     [facility] Adult PT Treatment/Exercise - 02/16/16 0001      Shoulder Exercises: Supine   Flexion Strengthening;10 reps;Both  supine with cane   Shoulder Flexion Weight (lbs) RTUE punches 2# 2x10, LT AAROM 0# 2x10     Shoulder Exercises: Seated   Other Seated Exercises Biceps RT 4# 2x10, LTUE 2# 2x10     Shoulder Exercises:  Sidelying   External Rotation AAROM;Left;10 reps  PTA assist     Shoulder Exercises: Standing   Internal Rotation Strengthening;Both;10 reps;Theraband   Theraband Level (Shoulder Internal Rotation) Level 1 (Yellow)   Flexion AAROM;Right;20 reps  using finger ladder     Shoulder Exercises: Pulleys   Flexion 3 minutes                  PT Short Term Goals - 02/16/16 1131      PT SHORT TERM GOAL #5   Title report > or = to 50% use of Rt UE with self-care and ADLs   Time 4   Period Weeks   Status --  70%     PT SHORT TERM GOAL #6   Title report < or = to 5/10 Lt shoulder pain with daily activity   Time 4   Status Achieved  5/10           PT Long Term Goals - 01/28/16 1507      PT LONG TERM GOAL #1   Title be independent in advanced HEP   Time 8   Period Weeks   Status On-going     PT LONG TERM GOAL #2   Title reduce FOTO to < or = to 55% limitation   Time 8   Period Weeks   Status On-going     PT LONG TERM GOAL #3   Title demonstrate Lt shoulder P/ROM flexion to > or = to 110 degrees to improve use   Time 8   Period Weeks   Status On-going     PT LONG TERM GOAL #4   Title demonstrate Lt shoulder P/ROM IR/ER to > or = to 45 degrees to improve use   Time 8   Period Days   Status On-going     PT LONG TERM GOAL #5   Title demonstrate Rt shoulder IR to L2 to improve use with self-care   Time 8   Period Weeks   Status On-going     PT LONG TERM GOAL #6   Title report < or = to 4/10 Rt and Lt shoulder pain with daily use   Time 8   Status On-going               Plan - 02/16/16 1145    Clinical Impression Statement Pt not compliant with band exercises per her report today. She does report that both shoulders feel better lately, unsure why.  Upon measuring the PROM of LTUE flexion she measured at 108 meeting all STGs this week. She also reports  that she is using her RTUE 70% of the time now.     Rehab Potential Good   PT Frequency 2x /  week   PT Duration 8 weeks   PT Treatment/Interventions ADLs/Self Care Home Management;Electrical Stimulation;Cryotherapy;Functional mobility training;Moist Heat;Therapeutic activities;Therapeutic exercise;Neuromuscular re-education;Patient/family education;Passive range of motion;Scar mobilization;Manual techniques;Vasopneumatic Device;Taping   PT Next Visit Plan Rt shoulder A/ROM and strength progression as tolerated, follow post-op protocol for Lt shoulder.   Consulted and Agree with Plan of Care Patient      Patient will benefit from skilled therapeutic intervention in order to improve the following deficits and impairments:  Decreased range of motion, Pain, Increased muscle spasms, Decreased endurance, Decreased activity tolerance, Postural dysfunction, Increased edema, Decreased strength, Impaired flexibility, Decreased scar mobility, Impaired UE functional use  Visit Diagnosis: Acute pain of left shoulder  Stiffness of left shoulder, not elsewhere classified  Muscle weakness (generalized)  Stiffness of right shoulder, not elsewhere classified     Problem List Patient Active Problem List   Diagnosis Date Noted  . History of palpitations 09/16/2015  . Pre-operative cardiovascular examination 09/16/2015  . Bipolar 1 disorder ([facility]) 09/16/2015  . Palpitations 08/28/2014  . Hyperlipidemia LDL goal <70 11/26/2013  . History of breast cancer 11/16/2012  . Fibromyalgia 11/16/2012  . History of tobacco abuse 11/16/2012  . MRSA 04/13/2010  . INTERTRIGO 04/13/2010  . GROUP B STREPTOCOCCUS INFECTION, HX OF 04/13/2010  . HYPERLIPIDEMIA 09/02/2006  . DEPRESSION 09/02/2006  . Essential hypertension 09/02/2006  . COPD 09/02/2006  . GERD 09/02/2006    Taj Nevins, PTA 02/16/2016, 11:48 AM  [facility] [address], [address] [place], [place], [address] Phone: [phone]   Fax:  [phone]  Name: Anita Arroyo MRN:  [id number] Date of Birth: [birth year]

## 2016-02-17 DIAGNOSIS — M19012 Primary osteoarthritis, left shoulder: Secondary | ICD-10-CM | POA: Diagnosis not present

## 2016-02-18 ENCOUNTER — Ambulatory Visit: Payer: Commercial Managed Care - HMO | Admitting: Physical Therapy

## 2016-02-18 ENCOUNTER — Encounter: Payer: Self-pay | Admitting: Physical Therapy

## 2016-02-18 DIAGNOSIS — M25512 Pain in left shoulder: Secondary | ICD-10-CM | POA: Diagnosis not present

## 2016-02-18 DIAGNOSIS — M25611 Stiffness of right shoulder, not elsewhere classified: Secondary | ICD-10-CM | POA: Diagnosis not present

## 2016-02-18 DIAGNOSIS — R600 Localized edema: Secondary | ICD-10-CM | POA: Diagnosis not present

## 2016-02-18 DIAGNOSIS — M6281 Muscle weakness (generalized): Secondary | ICD-10-CM

## 2016-02-18 DIAGNOSIS — M25612 Stiffness of left shoulder, not elsewhere classified: Secondary | ICD-10-CM

## 2016-02-18 DIAGNOSIS — M25511 Pain in right shoulder: Secondary | ICD-10-CM | POA: Diagnosis not present

## 2016-02-18 NOTE — Therapy (Signed)
[facility] [address], [address] [place], [place], [address] Phone: [phone]   Fax:  [phone]  Physical Therapy Treatment  Patient Details  Name: Anita Arroyo MRN: [id number] Date of Birth: 05-23-1960 Referring Provider: Mckinley Jewel, MD  Encounter Date: 02/18/2016      PT End of Session - 02/18/16 1108    Visit Number 9   Number of Visits 10   Date for PT Re-Evaluation 03/11/16   PT Start Time 1100   PT Stop Time 1200   PT Time Calculation (min) 60 min   Activity Tolerance Patient tolerated treatment well   Behavior During Therapy [facility] for tasks assessed/performed      Past Medical History:  Diagnosis Date  . ADHD (attention deficit hyperactivity disorder)   . Arthritis   . Asthma   . Bipolar 1 disorder (HCC)   . Bipolar 1 disorder (HCC)   . Breast cancer (HCC)   . Depression   . Dysrhythmia    palpitations  . Dysrhythmia    atrial fibrillation  . Fibromyalgia   . GERD (gastroesophageal reflux disease)   . Heart murmur   . History of stress test 04/28/2011   showed normal perfusion without scar or ischemia  . Hx of echocardiogram 04/28/2011   showed normal systolic function with mild diastolic dysfunction,she had trace MR but did not have frank mitral valve prolapse demonstrated. She had mild pulmonary hypertension with an estimated RV systolic pressure at 34 mm.  . Hyperlipemia   . Meniere disease   . MVP (mitral valve prolapse)   . Neuromuscular disorder (HCC)    fibromyalgia    Past Surgical History:  Procedure Laterality Date  . ANKLE SURGERY     left x4  . BREAST SURGERY  2011   lumpectomy right breast   . COLONOSCOPY WITH PROPOFOL N/A 02/25/2014   Procedure: COLONOSCOPY WITH PROPOFOL;  Surgeon: Charolett Bumpers, MD;  Location: [facility];  Service: Endoscopy;  Laterality: N/A;  . deviated septum    . ECTOPIC PREGNANCY SURGERY    . ELBOW SURGERY     right elbow  . EYE SURGERY     cataract surgery  . fibroid     2-3 fibroid adenomas removed  . JOINT REPLACEMENT  2011   right knee   . KNEE SURGERY     left knee  . SHOULDER ARTHROSCOPY WITH BICEPSTENOTOMY Right 09/25/2015   Procedure: SHOULDER ARTHROSCOPY WITH BICEPSTENOTOMY;  Surgeon: Loreta Ave, MD;  Location: [facility];  Service: Orthopedics;  Laterality: Right;  . SHOULDER ARTHROSCOPY WITH DISTAL CLAVICLE RESECTION Right 09/25/2015   Procedure: SHOULDER ARTHROSCOPY WITH DISTAL CLAVICLE RESECTION;  Surgeon: Loreta Ave, MD;  Location: [facility];  Service: Orthopedics;  Laterality: Right;  . SHOULDER ARTHROSCOPY WITH SUBACROMIAL DECOMPRESSION Right 09/25/2015   Procedure: RIGHT SHOULDER ARTHROSCOPY DEBRIDEMENT,ACROMIOPLASTY,DISTAL CLAVICAL EXCISION, RELEASE OF BICEPS TENDON;  Surgeon: Loreta Ave, MD;  Location: [facility];  Service: Orthopedics;  Laterality: Right;  . TMJ ARTHROPLASTY    . TONSILLECTOMY    . TOTAL SHOULDER ARTHROPLASTY Left 01/05/2016  . UTERINE FIBROID SURGERY     polups and fibroids removed   . WRIST SURGERY     left    There were no vitals filed for this visit.      Subjective Assessment - 02/18/16 1106    Subjective Pt saw MD yesterday, MD pleased. Pt is cleared to weaned form sling at this time.  Pt is now able to drive.    Currently in Pain? --  RT shoulder 3/10 Lt shoulder out of sling now:  5/10   Pain Descriptors / Indicators Aching                         [facility] Adult PT Treatment/Exercise - 02/18/16 0001      Shoulder Exercises: Supine   Flexion Strengthening;20 reps;Both  supine with cane   Shoulder Flexion Weight (lbs) RTUE punches 2# 2x10, LT AAROM 0# 2x10     Shoulder Exercises: Seated   External Rotation AROM;Left;10 reps  Used table for support   Other Seated Exercises Biceps RT 4# 2x10, LTUE 2# 2x15     Shoulder Exercises: Sidelying   External Rotation AAROM;Left;20 reps     Shoulder  Exercises: Standing   External Rotation Strengthening;Right;20 reps;Theraband   Theraband Level (Shoulder External Rotation) Level 1 (Yellow)   Internal Rotation Strengthening;Both;20 reps;Theraband   Theraband Level (Shoulder Internal Rotation) Level 1 (Yellow)   Row AROM;Strengthening;Both;10 reps;Theraband   Theraband Level (Shoulder Row) Level 1 (Yellow)     Shoulder Exercises: Pulleys   Flexion 3 minutes  slight scaption 2 min     Moist Heat Therapy   Number Minutes Moist Heat 15 Minutes   Moist Heat Location Shoulder  Rt     Cryotherapy   Number Minutes Cryotherapy 15 Minutes   Cryotherapy Location Shoulder  Lt   Type of Cryotherapy Ice pack                PT Education - 02/18/16 1144    Education provided Yes   Education Details Table supported LTUE ER   Person(s) Educated Patient   Methods Explanation;Demonstration;Tactile cues;Verbal cues   Comprehension Verbalized understanding;Returned demonstration          PT Short Term Goals - 02/16/16 1131      PT SHORT TERM GOAL #5   Title report > or = to 50% use of Rt UE with self-care and ADLs   Time 4   Period Weeks   Status --  70%     PT SHORT TERM GOAL #6   Title report < or = to 5/10 Lt shoulder pain with daily activity   Time 4   Status Achieved  5/10           PT Long Term Goals - 01/28/16 1507      PT LONG TERM GOAL #1   Title be independent in advanced HEP   Time 8   Period Weeks   Status On-going     PT LONG TERM GOAL #2   Title reduce FOTO to < or = to 55% limitation   Time 8   Period Weeks   Status On-going     PT LONG TERM GOAL #3   Title demonstrate Lt shoulder P/ROM flexion to > or = to 110 degrees to improve use   Time 8   Period Weeks   Status On-going     PT LONG TERM GOAL #4   Title demonstrate Lt shoulder P/ROM IR/ER to > or = to 45 degrees to improve use   Time 8   Period Days   Status On-going     PT LONG TERM GOAL #5   Title demonstrate Rt shoulder IR  to L2 to improve use with self-care   Time 8   Period Weeks   Status On-going     PT LONG TERM  GOAL #6   Title report < or = to 4/10 Rt and Lt shoulder pain with daily use   Time 8   Status On-going               Plan - 02/18/16 1108    Clinical Impression Statement Pt saw MD yesterday with good report, MD please with progress. She is now able to wean from her sling. She reports not wearing her sling at all yestaerday and no increased pain. She is mostly limited in external rotation on the LT both ROM and strength. Supine flexion of LTUE was 110 degrees today. Some pain accomplanied with fatigue near end of session.     Rehab Potential Good   PT Frequency 2x / week   PT Duration 8 weeks   PT Treatment/Interventions ADLs/Self Care Home Management;Electrical Stimulation;Cryotherapy;Functional mobility training;Moist Heat;Therapeutic activities;Therapeutic exercise;Neuromuscular re-education;Patient/family education;Passive range of motion;Scar mobilization;Manual techniques;Vasopneumatic Device;Taping   Consulted and Agree with Plan of Care Patient      Patient will benefit from skilled therapeutic intervention in order to improve the following deficits and impairments:  Decreased range of motion, Pain, Increased muscle spasms, Decreased endurance, Decreased activity tolerance, Postural dysfunction, Increased edema, Decreased strength, Impaired flexibility, Decreased scar mobility, Impaired UE functional use  Visit Diagnosis: Acute pain of left shoulder  Stiffness of left shoulder, not elsewhere classified  Muscle weakness (generalized)  Stiffness of right shoulder, not elsewhere classified  Acute pain of right shoulder     Problem List Patient Active Problem List   Diagnosis Date Noted  . History of palpitations 09/16/2015  . Pre-operative cardiovascular examination 09/16/2015  . Bipolar 1 disorder (HCC) 09/16/2015  . Palpitations 08/28/2014  . Hyperlipidemia LDL  goal <70 11/26/2013  . History of breast cancer 11/16/2012  . Fibromyalgia 11/16/2012  . History of tobacco abuse 11/16/2012  . MRSA 04/13/2010  . INTERTRIGO 04/13/2010  . GROUP B STREPTOCOCCUS INFECTION, HX OF 04/13/2010  . HYPERLIPIDEMIA 09/02/2006  . DEPRESSION 09/02/2006  . Essential hypertension 09/02/2006  . COPD 09/02/2006  . GERD 09/02/2006    Haedyn Ancrum, PTA 02/18/2016, 11:57 AM  [facility] [address], [address] [place], [place], [address] Phone: [phone]   Fax:  [phone]  Name: Anita Arroyo MRN: [id number] Date of Birth: 09-12-1960

## 2016-02-19 ENCOUNTER — Ambulatory Visit (INDEPENDENT_AMBULATORY_CARE_PROVIDER_SITE_OTHER): Payer: Commercial Managed Care - HMO | Admitting: Psychiatry

## 2016-02-19 ENCOUNTER — Encounter (HOSPITAL_COMMUNITY): Payer: Self-pay | Admitting: Psychiatry

## 2016-02-19 DIAGNOSIS — F319 Bipolar disorder, unspecified: Secondary | ICD-10-CM

## 2016-02-19 DIAGNOSIS — Z9889 Other specified postprocedural states: Secondary | ICD-10-CM | POA: Diagnosis not present

## 2016-02-19 DIAGNOSIS — Z79899 Other long term (current) drug therapy: Secondary | ICD-10-CM

## 2016-02-19 DIAGNOSIS — Z808 Family history of malignant neoplasm of other organs or systems: Secondary | ICD-10-CM | POA: Diagnosis not present

## 2016-02-19 DIAGNOSIS — Z8249 Family history of ischemic heart disease and other diseases of the circulatory system: Secondary | ICD-10-CM

## 2016-02-19 DIAGNOSIS — Z888 Allergy status to other drugs, medicaments and biological substances status: Secondary | ICD-10-CM

## 2016-02-19 DIAGNOSIS — F3162 Bipolar disorder, current episode mixed, moderate: Secondary | ICD-10-CM | POA: Diagnosis not present

## 2016-02-19 DIAGNOSIS — Z882 Allergy status to sulfonamides status: Secondary | ICD-10-CM

## 2016-02-19 DIAGNOSIS — F17210 Nicotine dependence, cigarettes, uncomplicated: Secondary | ICD-10-CM

## 2016-02-19 MED ORDER — TRAZODONE HCL 100 MG PO TABS: 200.0000 mg | ORAL_TABLET | Freq: Every day | ORAL | 0 refills | Status: DC

## 2016-02-19 MED ORDER — VENLAFAXINE HCL ER 150 MG PO CP24: 150.0000 mg | ORAL_CAPSULE | Freq: Every day | ORAL | 0 refills | Status: DC

## 2016-02-19 MED ORDER — LAMOTRIGINE 150 MG PO TABS: 150.0000 mg | ORAL_TABLET | Freq: Two times a day (BID) | ORAL | 0 refills | Status: DC

## 2016-02-19 NOTE — Progress Notes (Signed)
[facility] MD/PA/NP OP Progress Note  02/19/2016 10:41 AM Anita Arroyo  MRN:  [id number]  Chief Complaint:  Chief Complaint    Follow-up     Subjective:  I have shoulder surgery.  I'm getting better.  HPI: Medication for her follow-up appointment.  She had shoulder surgery last month.  She is feeling better.  She was prescribed narcotic pain medication however she cut down and taking as needed.  She was pleased because she moved in with her mother who is very supportive after the surgery.  She is taking her medication and reported no side effects.  She denies any irritability, anger, mania or any psychosis.  In the beginning after the surgery she was complaining of poor sleep but now she sleeping good.  She is doing physical therapy every day.  She is hoping to come off from sling in few weeks.  She also started driving since yesterday and she is happy about it.  Patient denies any agitation, mania, hallucination or any self abusive behavior.  She wants to continue trazodone, Lamictal and Effexor.  She has no tremors shakes or any EPS.  Her energy level is good.  Her appetite is okay.  Her vital signs are stable.  Visit Diagnosis:    ICD-9-CM ICD-10-CM   1. Bipolar 1 disorder (HCC) 296.7 F31.9 venlafaxine XR (EFFEXOR-XR) 150 MG 24 hr capsule  2. Bipolar 1 disorder, mixed, moderate (HCC) 296.62 F31.62 traZODone (DESYREL) 100 MG tablet     lamoTRIgine (LAMICTAL) 150 MG tablet    Past Psychiatric History: Reviewed. Patient has history of bipolar disorder. She had a history of mood swings, anger, impulsive behavior.  She had tried Depakote, Prozac, Paxil, Zoloft and Topamax.  She was seen at [facility] for more than 10 years but she was not happy there. Patient denies any history of physical, sexual or verbal abuse.  She was also diagnosed with ADD but a stimulant cause worsening of bipolar disorder.  She had tried Ritalin and Adderall in the past.  Past Medical History:  Past Medical History:  Diagnosis  Date  . ADHD (attention deficit hyperactivity disorder)   . Arthritis   . Asthma   . Bipolar 1 disorder ([place])   . Bipolar 1 disorder ([place])   . Breast cancer ([place])   . Depression   . Dysrhythmia    palpitations  . Dysrhythmia    atrial fibrillation  . Fibromyalgia   . GERD (gastroesophageal reflux disease)   . Heart murmur   . History of stress test 04/28/2011   showed normal perfusion without scar or ischemia  . Hx of echocardiogram 04/28/2011   showed normal systolic function with mild diastolic dysfunction,she had trace MR but did not have frank mitral valve prolapse demonstrated. She had mild pulmonary hypertension with an estimated RV systolic pressure at 34 mm.  . Hyperlipemia   . Meniere disease   . MVP (mitral valve prolapse)   . Neuromuscular disorder ([place])    fibromyalgia    Past Surgical History:  Procedure Laterality Date  . ANKLE SURGERY     left x4  . BREAST SURGERY  2011   lumpectomy right breast   . COLONOSCOPY WITH PROPOFOL N/A 02/25/2014   Procedure: COLONOSCOPY WITH PROPOFOL;  Surgeon: Garlan Fair, MD;  Location: [facility];  Service: Endoscopy;  Laterality: N/A;  . deviated septum    . ECTOPIC PREGNANCY SURGERY    . ELBOW SURGERY     right elbow  . EYE SURGERY  cataract surgery  . fibroid     2-3 fibroid adenomas removed  . JOINT REPLACEMENT  2011   right knee   . KNEE SURGERY     left knee  . SHOULDER ARTHROSCOPY WITH BICEPSTENOTOMY Right 09/25/2015   Procedure: SHOULDER ARTHROSCOPY WITH BICEPSTENOTOMY;  Surgeon: Ninetta Lights, MD;  Location: [facility];  Service: Orthopedics;  Laterality: Right;  . SHOULDER ARTHROSCOPY WITH DISTAL CLAVICLE RESECTION Right 09/25/2015   Procedure: SHOULDER ARTHROSCOPY WITH DISTAL CLAVICLE RESECTION;  Surgeon: Ninetta Lights, MD;  Location: [facility];  Service: Orthopedics;  Laterality: Right;  . SHOULDER ARTHROSCOPY WITH SUBACROMIAL DECOMPRESSION Right 09/25/2015   Procedure:  RIGHT SHOULDER ARTHROSCOPY DEBRIDEMENT,ACROMIOPLASTY,DISTAL CLAVICAL EXCISION, RELEASE OF BICEPS TENDON;  Surgeon: Ninetta Lights, MD;  Location: [facility];  Service: Orthopedics;  Laterality: Right;  . TMJ ARTHROPLASTY    . TONSILLECTOMY    . TOTAL SHOULDER ARTHROPLASTY Left 01/05/2016  . UTERINE FIBROID SURGERY     polups and fibroids removed   . WRIST SURGERY     left    Family Psychiatric History: Reviewed.  Family History:  Family History  Problem Relation Age of Onset  . Mitral valve prolapse Mother   . Heart Problems Mother   . Cancer Mother     Social History:  Social History   Social History  . Marital status: Single    Spouse name: N/A  . Number of children: N/A  . Years of education: N/A   Social History Main Topics  . Smoking status: Current Every Day Smoker    Packs/day: 1.50    Years: 40.00    Types: Cigarettes  . Smokeless tobacco: Never Used     Comment: Cannot afford chantix. Working on getting discount from drug compny  . Alcohol use 0.0 oz/week     Comment: occ  . Drug use: No  . Sexual activity: Not Asked   Other Topics Concern  . None   Social History Narrative  . None    Allergies:  Allergies  Allergen Reactions  . Contrast Media [Iodinated Diagnostic Agents] Shortness Of Breath  . Iodine Shortness Of Breath  . Adhesive [Tape]     blister  . Amoxicillin-Pot Clavulanate Rash  . Ceclor [Cefaclor] Rash  . Moxifloxacin Rash  . Sulfonamide Derivatives Nausea And Vomiting    Metabolic Disorder Labs: No results found for: HGBA1C, MPG No results found for: PROLACTIN Lab Results  Component Value Date   CHOL 144 11/22/2013   TRIG 80 11/22/2013   HDL 49 11/22/2013   CHOLHDL 2.9 11/22/2013   VLDL 16 11/22/2013   LDLCALC 79 11/22/2013     Current Medications: Current Outpatient Prescriptions  Medication Sig Dispense Refill  . ADVAIR DISKUS 250-50 MCG/DOSE AEPB Inhale 1 puff into the lungs 2 (two) times daily.      Marland Kitchen atenolol (TENORMIN) 50 MG tablet Take 1 tablet (50 mg total) by mouth 2 (two) times daily. Please schedule your follow-up appt. 180 tablet 0  . cetirizine (ZYRTEC) 10 MG tablet Take 10 mg by mouth daily.     . fluticasone (FLONASE) 50 MCG/ACT nasal spray Place into both nostrils daily.    Marland Kitchen lamoTRIgine (LAMICTAL) 150 MG tablet Take 1 tablet (150 mg total) by mouth 2 (two) times daily. 180 tablet 0  . oxyCODONE-acetaminophen (PERCOCET) 5-325 MG tablet Take 1-2 tablets by mouth every 4 (four) hours as needed for severe pain. 60 tablet 0  . pantoprazole (PROTONIX) 40 MG  tablet Take 1 tablet by mouth daily.    . traZODone (DESYREL) 100 MG tablet Take 2 tablets (200 mg total) by mouth at bedtime. 180 tablet 0  . triamterene-hydrochlorothiazide (DYAZIDE) 37.5-25 MG per capsule Take 0.5 capsules by mouth daily. Pt takes 1/2 tablet daily.    Marland Kitchen venlafaxine XR (EFFEXOR-XR) 150 MG 24 hr capsule Take 1 capsule (150 mg total) by mouth daily. 90 capsule 0   No current facility-administered medications for this visit.     Neurologic: Headache: No Seizure: No Paresthesias: No  Musculoskeletal: Strength & Muscle Tone: Pain in her left shoulder. Gait & Station: normal Patient leans: N/A  Psychiatric Specialty Exam: Review of Systems  Constitutional: Negative.   HENT: Negative.   Musculoskeletal: Positive for joint pain.       Sling on her left shoulder.  Skin: Negative.   Neurological: Negative.     Blood pressure 126/78, pulse 72, height 5' 2.5" (1.588 m), weight 174 lb 6.4 oz (79.1 kg), last menstrual period 06/25/2011.Body mass index is 31.39 kg/m.  General Appearance: Casual  Eye Contact:  Good  Speech:  Clear and Coherent  Volume:  Normal  Mood:  Euthymic  Affect:  Appropriate  Thought Process:  Goal Directed  Orientation:  Full (Time, Place, and Person)  Thought Content: WDL and Logical   Suicidal Thoughts:  No  Homicidal Thoughts:  No  Memory:  Immediate;   Good Recent;    Good Remote;   Good  Judgement:  Good  Insight:  Good  Psychomotor Activity:  Normal  Concentration:  Concentration: Fair and Attention Span: Good  Recall:  Good  Fund of Knowledge: Good  Language: Good  Akathisia:  No  Handed:  Right  AIMS (if indicated):  0  Assets:  Communication Skills Desire for Improvement Financial Resources/Insurance Housing Resilience Social Support  ADL's:  Intact  Cognition: WNL  Sleep:  Good    Assessment: Bipolar disorder type I.  Attention deficit disorder by history.  Plan: Patient is a stable on her current psychiatric medication.  She has no rash, itching or any shakes.  She is slowly recovering after left shoulder surgery.  She is going to physical therapy every day.  I will continue trazodone 200 mg at bedtime, Lamictal 150 mg twice a day and Effexor 150 mg daily.  Recommended to call us back if she has any question, concern if she feels worsening of the symptom.  Follow-up in 3 months.     Elaiza Shoberg T., MD 02/19/2016, 10:41 AM

## 2016-02-23 ENCOUNTER — Encounter: Payer: Self-pay | Admitting: Physical Therapy

## 2016-02-23 ENCOUNTER — Ambulatory Visit: Payer: Commercial Managed Care - HMO | Admitting: Physical Therapy

## 2016-02-23 DIAGNOSIS — M25512 Pain in left shoulder: Secondary | ICD-10-CM

## 2016-02-23 DIAGNOSIS — M6281 Muscle weakness (generalized): Secondary | ICD-10-CM

## 2016-02-23 DIAGNOSIS — M25511 Pain in right shoulder: Secondary | ICD-10-CM | POA: Diagnosis not present

## 2016-02-23 DIAGNOSIS — M25611 Stiffness of right shoulder, not elsewhere classified: Secondary | ICD-10-CM

## 2016-02-23 DIAGNOSIS — M25612 Stiffness of left shoulder, not elsewhere classified: Secondary | ICD-10-CM

## 2016-02-23 DIAGNOSIS — R600 Localized edema: Secondary | ICD-10-CM | POA: Diagnosis not present

## 2016-02-23 NOTE — Therapy (Signed)
[facility] [address] [place], [place], [address] Phone: [phone]   Fax:  [phone]  Physical Therapy Treatment  Patient Details  Name: Anita Arroyo MRN: [id number] Date of Birth: [birth year] Referring Provider: Kathryne Hitch, MD  Encounter Date: 02/23/2016      PT End of Session - 02/23/16 1106    Visit Number 10   Number of Visits 10   Date for PT Re-Evaluation 03/11/16   PT Start Time 1100   PT Stop Time 1200   PT Time Calculation (min) 60 min   Activity Tolerance Patient tolerated treatment well   Behavior During Therapy [facility] for tasks assessed/performed      Past Medical History:  Diagnosis Date  . ADHD (attention deficit hyperactivity disorder)   . Arthritis   . Asthma   . Bipolar 1 disorder ([place])   . Bipolar 1 disorder ([place])   . Breast cancer ([place])   . Depression   . Dysrhythmia    palpitations  . Dysrhythmia    atrial fibrillation  . Fibromyalgia   . GERD (gastroesophageal reflux disease)   . Heart murmur   . History of stress test 04/28/2011   showed normal perfusion without scar or ischemia  . Hx of echocardiogram 04/28/2011   showed normal systolic function with mild diastolic dysfunction,she had trace MR but did not have frank mitral valve prolapse demonstrated. She had mild pulmonary hypertension with an estimated RV systolic pressure at 34 mm.  . Hyperlipemia   . Meniere disease   . MVP (mitral valve prolapse)   . Neuromuscular disorder ([place])    fibromyalgia    Past Surgical History:  Procedure Laterality Date  . ANKLE SURGERY     left x4  . BREAST SURGERY  2011   lumpectomy right breast   . COLONOSCOPY WITH PROPOFOL N/A 02/25/2014   Procedure: COLONOSCOPY WITH PROPOFOL;  Surgeon: Garlan Fair, MD;  Location: [facility];  Service: Endoscopy;  Laterality: N/A;  . deviated septum    . ECTOPIC PREGNANCY SURGERY    . ELBOW SURGERY     right elbow  . EYE SURGERY     cataract surgery  . fibroid     2-3 fibroid adenomas removed  . JOINT REPLACEMENT  2011   right knee   . KNEE SURGERY     left knee  . SHOULDER ARTHROSCOPY WITH BICEPSTENOTOMY Right 09/25/2015   Procedure: SHOULDER ARTHROSCOPY WITH BICEPSTENOTOMY;  Surgeon: Ninetta Lights, MD;  Location: [facility];  Service: Orthopedics;  Laterality: Right;  . SHOULDER ARTHROSCOPY WITH DISTAL CLAVICLE RESECTION Right 09/25/2015   Procedure: SHOULDER ARTHROSCOPY WITH DISTAL CLAVICLE RESECTION;  Surgeon: Ninetta Lights, MD;  Location: [facility];  Service: Orthopedics;  Laterality: Right;  . SHOULDER ARTHROSCOPY WITH SUBACROMIAL DECOMPRESSION Right 09/25/2015   Procedure: RIGHT SHOULDER ARTHROSCOPY DEBRIDEMENT,ACROMIOPLASTY,DISTAL CLAVICAL EXCISION, RELEASE OF BICEPS TENDON;  Surgeon: Ninetta Lights, MD;  Location: [facility];  Service: Orthopedics;  Laterality: Right;  . TMJ ARTHROPLASTY    . TONSILLECTOMY    . TOTAL SHOULDER ARTHROPLASTY Left 01/05/2016  . UTERINE FIBROID SURGERY     polups and fibroids removed   . WRIST SURGERY     left    There were no vitals filed for this visit.      Subjective Assessment - 02/23/16 1107    Subjective In more pain since last session. Pt does report trying to do more with the LT  arm, could be why? Could be the rain?   Currently in Pain? Yes  RT shoulder 4/10, LT shoulder 7/10   Pain Descriptors / Indicators Tender   Aggravating Factors  Use, not sure why the increase last few days.    Pain Relieving Factors meds, heat & ice   Multiple Pain Sites No            [facility] PT Assessment - 02/23/16 0001      Observation/Other Assessments   Focus on Therapeutic Outcomes (FOTO)  53% limitation                     [facility] Adult PT Treatment/Exercise - 02/23/16 0001      Shoulder Exercises: Supine   Flexion AAROM;Both;20 reps  cane   Shoulder Flexion Weight (lbs) LTUE punches to 90 degrees 10x 2      Shoulder Exercises: Seated   Other Seated Exercises Biceps RT 4# 2x10, LTUE 2# 2x15     Shoulder Exercises: Standing   Flexion AAROM;Left;10 reps  On finger ladder     Shoulder Exercises: Pulleys   Flexion --  20x, VC to keep arm lower today secondary to pain.     Moist Heat Therapy   Number Minutes Moist Heat 15 Minutes   Moist Heat Location Shoulder  Rt     Cryotherapy   Number Minutes Cryotherapy 15 Minutes   Cryotherapy Location Shoulder  Lt   Type of Cryotherapy Ice pack     Manual Therapy   Soft tissue mobilization LT shoulder, pectoral, lattissimus, and  upper arm.                  PT Short Term Goals - 02/23/16 1145      PT SHORT TERM GOAL #3   Title demonstrate Lt shoulder PROM flexion to 90 degrees to improve mobility and allow for use as able   Time 4   Period Weeks   Status Achieved     PT SHORT TERM GOAL #4   Title demonstrate 4+/5 Rt shoulder strength to improve use   Time 4   Period Days   Status On-going     PT SHORT TERM GOAL #5   Title report > or = to 50% use of Rt UE with self-care and ADLs   Time 4   Period Weeks   Status Achieved           PT Long Term Goals - 01/28/16 1507      PT LONG TERM GOAL #1   Title be independent in advanced HEP   Time 8   Period Weeks   Status On-going     PT LONG TERM GOAL #2   Title reduce FOTO to < or = to 55% limitation   Time 8   Period Weeks   Status On-going     PT LONG TERM GOAL #3   Title demonstrate Lt shoulder P/ROM flexion to > or = to 110 degrees to improve use   Time 8   Period Weeks   Status On-going     PT LONG TERM GOAL #4   Title demonstrate Lt shoulder P/ROM IR/ER to > or = to 45 degrees to improve use   Time 8   Period Days   Status On-going     PT LONG TERM GOAL #5   Title demonstrate Rt shoulder IR to L2 to improve use with self-care   Time 8   Period Weeks  Status On-going     PT LONG TERM GOAL #6   Title report < or = to 4/10 Rt and Lt shoulder pain  with daily use   Time 8   Status On-going               Plan - 03-13-16 1142    Clinical Impression Statement Pt began PT today with reports of increased LT shoulder pain. Morethan likely it is the combination of weather and trying to use the arm more at home. Pectorals and lats were very dense and thickened today, soft tissue helped  this.  Pt was able to do the finger ladder with her LTUE  for the first time today, beginning at #5 and ended reaching to 12th level.  Shoulder mechanics looked good but pt does remain with significant weakness.      Rehab Potential Good   PT Frequency 2x / week   PT Duration 8 weeks   PT Treatment/Interventions ADLs/Self Care Home Management;Electrical Stimulation;Cryotherapy;Functional mobility training;Moist Heat;Therapeutic activities;Therapeutic exercise;Neuromuscular re-education;Patient/family education;Passive range of motion;Scar mobilization;Manual techniques;Vasopneumatic Device;Taping   PT Next Visit Plan Rt shoulder A/ROM and strength progression as tolerated, follow post-op protocol for Lt shoulder.   Consulted and Agree with Plan of Care Patient      Patient will benefit from skilled therapeutic intervention in order to improve the following deficits and impairments:  Decreased range of motion, Pain, Increased muscle spasms, Decreased endurance, Decreased activity tolerance, Postural dysfunction, Increased edema, Decreased strength, Impaired flexibility, Decreased scar mobility, Impaired UE functional use  Visit Diagnosis: Acute pain of left shoulder  Stiffness of left shoulder, not elsewhere classified  Muscle weakness (generalized)  Stiffness of right shoulder, not elsewhere classified  Acute pain of right shoulder       G-Codes - March 13, 2016 1144    Functional Assessment Tool Used FOTO: 53% limitation   Functional Limitation Other PT primary   Other PT Primary Current Status [id number]) At least 40 percent but less than 60 percent  impaired, limited or restricted   Other PT Primary Goal Status [id number]) At least 40 percent but less than 60 percent impaired, limited or restricted      Problem List Patient Active Problem List   Diagnosis Date Noted  . History of palpitations 09/16/2015  . Pre-operative cardiovascular examination 09/16/2015  . Bipolar 1 disorder ([place]) 09/16/2015  . Palpitations 08/28/2014  . Hyperlipidemia LDL goal <70 11/26/2013  . History of breast cancer 11/16/2012  . Fibromyalgia 11/16/2012  . History of tobacco abuse 11/16/2012  . MRSA 04/13/2010  . INTERTRIGO 04/13/2010  . GROUP B STREPTOCOCCUS INFECTION, HX OF 04/13/2010  . HYPERLIPIDEMIA 09/02/2006  . DEPRESSION 09/02/2006  . Essential hypertension 09/02/2006  . COPD 09/02/2006  . GERD 09/02/2006     Myrene Galas, PTA 2016-03-13 11:51 AM  [facility] [address], [place], [place], [address] Phone: [phone]   Fax:  [phone]  Name: Anita Arroyo MRN: [id number] Date of Birth: [birth year]

## 2016-02-25 ENCOUNTER — Ambulatory Visit: Payer: Commercial Managed Care - HMO | Admitting: Physical Therapy

## 2016-02-25 ENCOUNTER — Encounter: Payer: Self-pay | Admitting: Physical Therapy

## 2016-02-25 DIAGNOSIS — M25511 Pain in right shoulder: Secondary | ICD-10-CM

## 2016-02-25 DIAGNOSIS — R600 Localized edema: Secondary | ICD-10-CM | POA: Diagnosis not present

## 2016-02-25 DIAGNOSIS — M25612 Stiffness of left shoulder, not elsewhere classified: Secondary | ICD-10-CM | POA: Diagnosis not present

## 2016-02-25 DIAGNOSIS — M25512 Pain in left shoulder: Secondary | ICD-10-CM

## 2016-02-25 DIAGNOSIS — M6281 Muscle weakness (generalized): Secondary | ICD-10-CM | POA: Diagnosis not present

## 2016-02-25 DIAGNOSIS — M25611 Stiffness of right shoulder, not elsewhere classified: Secondary | ICD-10-CM | POA: Diagnosis not present

## 2016-02-25 NOTE — Therapy (Signed)
[facility] [address] [place], [place], [address] Phone: [phone]   Fax:  [phone]  Physical Therapy Treatment  Patient Details  Name: Anita Arroyo MRN: [id number] Date of Birth: 1960-03-05 Referring Provider: Mckinley Jewel, MD  Encounter Date: 02/25/2016      PT End of Session - 02/25/16 1147    Visit Number 11   Number of Visits 20   Date for PT Re-Evaluation 03/11/16   PT Start Time 1146   PT Stop Time 1235   PT Time Calculation (min) 49 min   Activity Tolerance --   Behavior During Therapy --      Past Medical History:  Diagnosis Date  . ADHD (attention deficit hyperactivity disorder)   . Arthritis   . Asthma   . Bipolar 1 disorder ([facility])   . Bipolar 1 disorder ([facility])   . Breast cancer ([facility])   . Depression   . Dysrhythmia    palpitations  . Dysrhythmia    atrial fibrillation  . Fibromyalgia   . GERD (gastroesophageal reflux disease)   . Heart murmur   . History of stress test 04/28/2011   showed normal perfusion without scar or ischemia  . Hx of echocardiogram 04/28/2011   showed normal systolic function with mild diastolic dysfunction,she had trace MR but did not have frank mitral valve prolapse demonstrated. She had mild pulmonary hypertension with an estimated RV systolic pressure at 34 mm.  . Hyperlipemia   . Meniere disease   . MVP (mitral valve prolapse)   . Neuromuscular disorder ([facility])    fibromyalgia    Past Surgical History:  Procedure Laterality Date  . ANKLE SURGERY     left x4  . BREAST SURGERY  2011   lumpectomy right breast   . COLONOSCOPY WITH PROPOFOL N/A 02/25/2014   Procedure: COLONOSCOPY WITH PROPOFOL;  Surgeon: Charolett Bumpers, MD;  [facility]: [facility];  Service: Endoscopy;  Laterality: N/A;  . deviated septum    . ECTOPIC PREGNANCY SURGERY    . ELBOW SURGERY     right elbow  . EYE SURGERY     cataract surgery  . fibroid     2-3 fibroid adenomas removed   . JOINT REPLACEMENT  2011   right knee   . KNEE SURGERY     left knee  . SHOULDER ARTHROSCOPY WITH BICEPSTENOTOMY Right 09/25/2015   Procedure: SHOULDER ARTHROSCOPY WITH BICEPSTENOTOMY;  Surgeon: Loreta Ave, MD;  Location: [facility];  Service: Orthopedics;  Laterality: Right;  . SHOULDER ARTHROSCOPY WITH DISTAL CLAVICLE RESECTION Right 09/25/2015   Procedure: SHOULDER ARTHROSCOPY WITH DISTAL CLAVICLE RESECTION;  Surgeon: Loreta Ave, MD;  Location: [facility];  Service: Orthopedics;  Laterality: Right;  . SHOULDER ARTHROSCOPY WITH SUBACROMIAL DECOMPRESSION Right 09/25/2015   Procedure: RIGHT SHOULDER ARTHROSCOPY DEBRIDEMENT,ACROMIOPLASTY,DISTAL CLAVICAL EXCISION, RELEASE OF BICEPS TENDON;  Surgeon: Loreta Ave, MD;  Location: [facility];  Service: Orthopedics;  Laterality: Right;  . TMJ ARTHROPLASTY    . TONSILLECTOMY    . TOTAL SHOULDER ARTHROPLASTY Left 01/05/2016  . UTERINE FIBROID SURGERY     polups and fibroids removed   . WRIST SURGERY     left    There were no vitals filed for this visit.      Subjective Assessment - 02/25/16 1150    Subjective Felt better after last session, did a lot around the +house yesterday.  [facility] PT Assessment - 02/25/16 0001      PROM   Left Shoulder Flexion 113 Degrees   Left Shoulder External Rotation 30 Degrees                     [facility] Adult PT Treatment/Exercise - 02/25/16 0001      Shoulder Exercises: Supine   Shoulder Flexion Weight (lbs) LTUE punches to 90 degrees 10x 2     Shoulder Exercises: Pulleys   Flexion 3 minutes     Moist Heat Therapy   Number Minutes Moist Heat --  Applied to RT shoulder during PROM of LT     Cryotherapy   Number Minutes Cryotherapy 10 Minutes   Cryotherapy Location --  Lt shoulder   Type of Cryotherapy Ice pack     Manual Therapy   Manual Therapy Passive ROM   Manual therapy comments Flexion, scaption  ER mainly    Soft tissue mobilization LT shoulder, pectoral, lattissimus, and  upper arm.                  PT Short Term Goals - 02/23/16 1145      PT SHORT TERM GOAL #3   Title demonstrate Lt shoulder PROM flexion to 90 degrees to improve mobility and allow for use as able   Time 4   Period Weeks   Status Achieved     PT SHORT TERM GOAL #4   Title demonstrate 4+/5 Rt shoulder strength to improve use   Time 4   Period Days   Status On-going     PT SHORT TERM GOAL #5   Title report > or = to 50% use of Rt UE with self-care and ADLs   Time 4   Period Weeks   Status Achieved           PT Long Term Goals - 01/28/16 1507      PT LONG TERM GOAL #1   Title be independent in advanced HEP   Time 8   Period Weeks   Status On-going     PT LONG TERM GOAL #2   Title reduce FOTO to < or = to 55% limitation   Time 8   Period Weeks   Status On-going     PT LONG TERM GOAL #3   Title demonstrate Lt shoulder P/ROM flexion to > or = to 110 degrees to improve use   Time 8   Period Weeks   Status On-going     PT LONG TERM GOAL #4   Title demonstrate Lt shoulder P/ROM IR/ER to > or = to 45 degrees to improve use   Time 8   Period Days   Status On-going     PT LONG TERM GOAL #5   Title demonstrate Rt shoulder IR to L2 to improve use with self-care   Time 8   Period Weeks   Status On-going     PT LONG TERM GOAL #6   Title report < or = to 4/10 Rt and Lt shoulder pain with daily use   Time 8   Status On-going               Plan - 02/25/16 1147    Clinical Impression Statement Measured LT shoulder PROM today, both flexion and ER have improved. Pt can get her Lt hand in her side pocket, but reaching behind her any farther than that is difficult. Pt wants to be able to put her hair  in a ponytail.  LT pectorals remains pretty tight, but better than last session.  Pt was pretty sore after PROM.    Rehab Potential Good   PT Frequency 2x / week   PT Duration 8 weeks    PT Treatment/Interventions ADLs/Self Care Home Management;Electrical Stimulation;Cryotherapy;Functional mobility training;Moist Heat;Therapeutic activities;Therapeutic exercise;Neuromuscular re-education;Patient/family education;Passive range of motion;Scar mobilization;Manual techniques;Vasopneumatic Device;Taping   PT Next Visit Plan Rt shoulder A/ROM and strength progression as tolerated, follow post-op protocol for Lt shoulder.   Consulted and Agree with Plan of Care --      Patient will benefit from skilled therapeutic intervention in order to improve the following deficits and impairments:  Decreased range of motion, Pain, Increased muscle spasms, Decreased endurance, Decreased activity tolerance, Postural dysfunction, Increased edema, Decreased strength, Impaired flexibility, Decreased scar mobility, Impaired UE functional use  Visit Diagnosis: Acute pain of left shoulder  Stiffness of left shoulder, not elsewhere classified  Muscle weakness (generalized)  Stiffness of right shoulder, not elsewhere classified  Acute pain of right shoulder     Problem List Patient Active Problem List   Diagnosis Date Noted  . History of palpitations 09/16/2015  . Pre-operative cardiovascular examination 09/16/2015  . Bipolar 1 disorder ([facility]) 09/16/2015  . Palpitations 08/28/2014  . Hyperlipidemia LDL goal <70 11/26/2013  . History of breast cancer 11/16/2012  . Fibromyalgia 11/16/2012  . History of tobacco abuse 11/16/2012  . MRSA 04/13/2010  . INTERTRIGO 04/13/2010  . GROUP B STREPTOCOCCUS INFECTION, HX OF 04/13/2010  . HYPERLIPIDEMIA 09/02/2006  . DEPRESSION 09/02/2006  . Essential hypertension 09/02/2006  . COPD 09/02/2006  . GERD 09/02/2006    Carold Eisner, PTA 02/25/2016, 12:36 PM  [facility] [address], [address] [place], [place], [address] Phone: [phone]   Fax:  [phone]  Name: AVIANCA GRAINGER MRN:  [id number] Date of Birth: Aug 26, 1960

## 2016-03-01 ENCOUNTER — Ambulatory Visit: Payer: Commercial Managed Care - HMO | Attending: Orthopedic Surgery | Admitting: Physical Therapy

## 2016-03-01 ENCOUNTER — Encounter: Payer: Self-pay | Admitting: Physical Therapy

## 2016-03-01 DIAGNOSIS — M25612 Stiffness of left shoulder, not elsewhere classified: Secondary | ICD-10-CM | POA: Diagnosis not present

## 2016-03-01 DIAGNOSIS — M6281 Muscle weakness (generalized): Secondary | ICD-10-CM | POA: Insufficient documentation

## 2016-03-01 DIAGNOSIS — M25611 Stiffness of right shoulder, not elsewhere classified: Secondary | ICD-10-CM | POA: Insufficient documentation

## 2016-03-01 DIAGNOSIS — M25511 Pain in right shoulder: Secondary | ICD-10-CM | POA: Diagnosis not present

## 2016-03-01 DIAGNOSIS — M25512 Pain in left shoulder: Secondary | ICD-10-CM

## 2016-03-01 NOTE — Therapy (Signed)
[facility] [address] [place], [place], [address] Phone: [phone]   Fax:  [phone]  Physical Therapy Treatment  Patient Details  Name: Anita Arroyo MRN: [id number] Date of Birth: [birth year] Referring Provider: Mckinley Jewel, MD  Encounter Date: 03/01/2016      PT End of Session - 03/01/16 1107    Visit Number 12   Number of Visits 20   Date for PT Re-Evaluation 03/11/16   PT Start Time 1100   PT Stop Time 1200   PT Time Calculation (min) 60 min   Activity Tolerance Patient limited by pain   Behavior During Therapy Anxious      Past Medical History:  Diagnosis Date  . ADHD (attention deficit hyperactivity disorder)   . Arthritis   . Asthma   . Bipolar 1 disorder ([facility])   . Bipolar 1 disorder ([facility])   . Breast cancer ([facility])   . Depression   . Dysrhythmia    palpitations  . Dysrhythmia    atrial fibrillation  . Fibromyalgia   . GERD (gastroesophageal reflux disease)   . Heart murmur   . History of stress test 04/28/2011   showed normal perfusion without scar or ischemia  . Hx of echocardiogram 04/28/2011   showed normal systolic function with mild diastolic dysfunction,she had trace MR but did not have frank mitral valve prolapse demonstrated. She had mild pulmonary hypertension with an estimated RV systolic pressure at 34 mm.  . Hyperlipemia   . Meniere disease   . MVP (mitral valve prolapse)   . Neuromuscular disorder ([facility])    fibromyalgia    Past Surgical History:  Procedure Laterality Date  . ANKLE SURGERY     left x4  . BREAST SURGERY  2011   lumpectomy right breast   . COLONOSCOPY WITH PROPOFOL N/A 02/25/2014   Procedure: COLONOSCOPY WITH PROPOFOL;  Surgeon: Charolett Bumpers, MD;  [facility];  Service: Endoscopy;  Laterality: N/A;  . deviated septum    . ECTOPIC PREGNANCY SURGERY    . ELBOW SURGERY     right elbow  . EYE SURGERY     cataract surgery  . fibroid     2-3  fibroid adenomas removed  . JOINT REPLACEMENT  2011   right knee   . KNEE SURGERY     left knee  . SHOULDER ARTHROSCOPY WITH BICEPSTENOTOMY Right 09/25/2015   Procedure: SHOULDER ARTHROSCOPY WITH BICEPSTENOTOMY;  Surgeon: Loreta Ave, MD;  Location: [facility];  Service: Orthopedics;  Laterality: Right;  . SHOULDER ARTHROSCOPY WITH DISTAL CLAVICLE RESECTION Right 09/25/2015   Procedure: SHOULDER ARTHROSCOPY WITH DISTAL CLAVICLE RESECTION;  Surgeon: Loreta Ave, MD;  Location: [facility];  Service: Orthopedics;  Laterality: Right;  . SHOULDER ARTHROSCOPY WITH SUBACROMIAL DECOMPRESSION Right 09/25/2015   Procedure: RIGHT SHOULDER ARTHROSCOPY DEBRIDEMENT,ACROMIOPLASTY,DISTAL CLAVICAL EXCISION, RELEASE OF BICEPS TENDON;  Surgeon: Loreta Ave, MD;  Location: [name];  Service: Orthopedics;  Laterality: Right;  . TMJ ARTHROPLASTY    . TONSILLECTOMY    . TOTAL SHOULDER ARTHROPLASTY Left 01/05/2016  . UTERINE FIBROID SURGERY     polups and fibroids removed   . WRIST SURGERY     left    There were no vitals filed for this visit.      Subjective Assessment - 03/01/16 1104    Subjective I feel like I have taken some backwards steps bc I did a big project at home organizing  clothes and putting them up.    Currently in Pain? Yes  LT 7-8/10   Pain Location Shoulder  LT > RT   Pain Descriptors / Indicators Sore   Aggravating Factors  Over used it this weekend   Pain Relieving Factors Massage, heat, meds   Multiple Pain Sites No                         [facility] Adult PT Treatment/Exercise - 03/01/16 0001      Shoulder Exercises: Standing   Flexion AAROM;Left;10 reps  On finger ladder   Row --  Attempted but to painful:stopped ex   Other Standing Exercises UE ranger forward flex 2x10, circles 20x each     Shoulder Exercises: Pulleys   Flexion 3 minutes     Moist Heat Therapy   Number Minutes Moist Heat 15 Minutes    Moist Heat Location --  Bil shoulder     Manual Therapy   Manual Therapy Joint mobilization;Passive ROM  Flexion,  ER to tolerance; not greart today   Joint Mobilization grade 1 post mobs   Soft tissue mobilization LT shoulder, pectoral, and  upper arm.                  PT Short Term Goals - 02/23/16 1145      PT SHORT TERM GOAL #3   Title demonstrate Lt shoulder PROM flexion to 90 degrees to improve mobility and allow for use as able   Time 4   Period Weeks   Status Achieved     PT SHORT TERM GOAL #4   Title demonstrate 4+/5 Rt shoulder strength to improve use   Time 4   Period Days   Status On-going     PT SHORT TERM GOAL #5   Title report > or = to 50% use of Rt UE with self-care and ADLs   Time 4   Period Weeks   Status Achieved           PT Long Term Goals - 01/28/16 1507      PT LONG TERM GOAL #1   Title be independent in advanced HEP   Time 8   Period Weeks   Status On-going     PT LONG TERM GOAL #2   Title reduce FOTO to < or = to 55% limitation   Time 8   Period Weeks   Status On-going     PT LONG TERM GOAL #3   Title demonstrate Lt shoulder P/ROM flexion to > or = to 110 degrees to improve use   Time 8   Period Weeks   Status On-going     PT LONG TERM GOAL #4   Title demonstrate Lt shoulder P/ROM IR/ER to > or = to 45 degrees to improve use   Time 8   Period Days   Status On-going     PT LONG TERM GOAL #5   Title demonstrate Rt shoulder IR to L2 to improve use with self-care   Time 8   Period Weeks   Status On-going     PT LONG TERM GOAL #6   Title report < or = to 4/10 Rt and Lt shoulder pain with daily use   Time 8   Status On-going               Plan - 03/01/16 1107    Clinical Impression Statement Pt reports increased pain today after overdoing a  project this weekend involving sorting, folding, and organizing clothes.  Pt did not tolerate much exercise but seemed to do ok with some basic AROM exercises. Did  not tolerate PROM well today, grade 1 mobs felt good to LT shoulder.     Rehab Potential Good   PT Frequency 2x / week   PT Treatment/Interventions ADLs/Self Care Home Management;Electrical Stimulation;Cryotherapy;Functional mobility training;Moist Heat;Therapeutic activities;Therapeutic exercise;Neuromuscular re-education;Patient/family education;Passive range of motion;Scar mobilization;Manual techniques;Vasopneumatic Device;Taping   PT Next Visit Plan Rt shoulder A/ROM and strength progression as tolerated, follow post-op protocol for Lt shoulder.   Consulted and Agree with Plan of Care --      Patient will benefit from skilled therapeutic intervention in order to improve the following deficits and impairments:  Decreased range of motion, Pain, Increased muscle spasms, Decreased endurance, Decreased activity tolerance, Postural dysfunction, Increased edema, Decreased strength, Impaired flexibility, Decreased scar mobility, Impaired UE functional use  Visit Diagnosis: Acute pain of left shoulder  Stiffness of left shoulder, not elsewhere classified  Muscle weakness (generalized)     Problem List Patient Active Problem List   Diagnosis Date Noted  . History of palpitations 09/16/2015  . Pre-operative cardiovascular examination 09/16/2015  . Bipolar 1 disorder ([facility]) 09/16/2015  . Palpitations 08/28/2014  . Hyperlipidemia LDL goal <70 11/26/2013  . History of breast cancer 11/16/2012  . Fibromyalgia 11/16/2012  . History of tobacco abuse 11/16/2012  . MRSA 04/13/2010  . INTERTRIGO 04/13/2010  . GROUP B STREPTOCOCCUS INFECTION, HX OF 04/13/2010  . HYPERLIPIDEMIA 09/02/2006  . DEPRESSION 09/02/2006  . Essential hypertension 09/02/2006  . COPD 09/02/2006  . GERD 09/02/2006    Lyda Colcord, PTA 03/01/2016, 4:20 PM  [facility] [address] [place], [place], [address] Phone: [phone]   Fax:   [phone]  Name: Anita Arroyo MRN: [id number] Date of Birth: 01/17/1961

## 2016-03-03 ENCOUNTER — Ambulatory Visit: Payer: Commercial Managed Care - HMO

## 2016-03-03 DIAGNOSIS — M25611 Stiffness of right shoulder, not elsewhere classified: Secondary | ICD-10-CM | POA: Diagnosis not present

## 2016-03-03 DIAGNOSIS — M25512 Pain in left shoulder: Secondary | ICD-10-CM | POA: Diagnosis not present

## 2016-03-03 DIAGNOSIS — M25612 Stiffness of left shoulder, not elsewhere classified: Secondary | ICD-10-CM | POA: Diagnosis not present

## 2016-03-03 DIAGNOSIS — M6281 Muscle weakness (generalized): Secondary | ICD-10-CM

## 2016-03-03 DIAGNOSIS — M25511 Pain in right shoulder: Secondary | ICD-10-CM

## 2016-03-03 NOTE — Therapy (Signed)
[facility] [address] [place], [place], [address] Phone: [phone]   Fax:  [phone]  Physical Therapy Treatment  Patient Details  Name: Anita Arroyo MRN: [id number] Date of Birth: 11/19/1960 Referring Provider: Kathryne Hitch, MD  Encounter Date: 03/03/2016      PT End of Session - 03/03/16 1134    Visit Number 13   Number of Visits 20   Date for PT Re-Evaluation 03/11/16   PT Start Time 1100   PT Stop Time 1145   PT Time Calculation (min) 45 min   Activity Tolerance Patient limited by pain   Behavior During Therapy [facility] for tasks assessed/performed      Past Medical History:  Diagnosis Date  . ADHD (attention deficit hyperactivity disorder)   . Arthritis   . Asthma   . Bipolar 1 disorder ([place])   . Bipolar 1 disorder ([place])   . Breast cancer ([place])   . Depression   . Dysrhythmia    palpitations  . Dysrhythmia    atrial fibrillation  . Fibromyalgia   . GERD (gastroesophageal reflux disease)   . Heart murmur   . History of stress test 04/28/2011   showed normal perfusion without scar or ischemia  . Hx of echocardiogram 04/28/2011   showed normal systolic function with mild diastolic dysfunction,she had trace MR but did not have frank mitral valve prolapse demonstrated. She had mild pulmonary hypertension with an estimated RV systolic pressure at 34 mm.  . Hyperlipemia   . Meniere disease   . MVP (mitral valve prolapse)   . Neuromuscular disorder ([place])    fibromyalgia    Past Surgical History:  Procedure Laterality Date  . ANKLE SURGERY     left x4  . BREAST SURGERY  2011   lumpectomy right breast   . COLONOSCOPY WITH PROPOFOL N/A 02/25/2014   Procedure: COLONOSCOPY WITH PROPOFOL;  Surgeon: Garlan Fair, MD;  Location: [facility];  Service: Endoscopy;  Laterality: N/A;  . deviated septum    . ECTOPIC PREGNANCY SURGERY    . ELBOW SURGERY     right elbow  . EYE SURGERY     cataract  surgery  . fibroid     2-3 fibroid adenomas removed  . JOINT REPLACEMENT  2011   right knee   . KNEE SURGERY     left knee  . SHOULDER ARTHROSCOPY WITH BICEPSTENOTOMY Right 09/25/2015   Procedure: SHOULDER ARTHROSCOPY WITH BICEPSTENOTOMY;  Surgeon: Ninetta Lights, MD;  Location: [place];  Service: Orthopedics;  Laterality: Right;  . SHOULDER ARTHROSCOPY WITH DISTAL CLAVICLE RESECTION Right 09/25/2015   Procedure: SHOULDER ARTHROSCOPY WITH DISTAL CLAVICLE RESECTION;  Surgeon: Ninetta Lights, MD;  Location: [place];  Service: Orthopedics;  Laterality: Right;  . SHOULDER ARTHROSCOPY WITH SUBACROMIAL DECOMPRESSION Right 09/25/2015   Procedure: RIGHT SHOULDER ARTHROSCOPY DEBRIDEMENT,ACROMIOPLASTY,DISTAL CLAVICAL EXCISION, RELEASE OF BICEPS TENDON;  Surgeon: Ninetta Lights, MD;  Location: [place];  Service: Orthopedics;  Laterality: Right;  . TMJ ARTHROPLASTY    . TONSILLECTOMY    . TOTAL SHOULDER ARTHROPLASTY Left 01/05/2016  . UTERINE FIBROID SURGERY     polups and fibroids removed   . WRIST SURGERY     left    There were no vitals filed for this visit.      Subjective Assessment - 03/03/16 1104    Subjective My Lt shoulder feels better because I have been taking it easy.  Pertinent History Rt shoulder manipulation and Lt total shoulder replacement: 01/05/16.  Follow post-op protocol   Patient Stated Goals improve A/ROM, P/ROM of Rt and Lt shoulder, use Lt UE   Currently in Pain? Yes   Pain Score 6    Pain Location Shoulder   Pain Orientation Right;Left   Pain Type Surgical pain   Pain Onset More than a month ago   Pain Frequency Constant                         [facility] Adult PT Treatment/Exercise - 03/03/16 0001      Elbow Exercises   Elbow Flexion Strengthening;20 reps  2# Lt, 4# Rt     Shoulder Exercises: Supine   Shoulder Flexion Weight (lbs) LTUE punches to 90 degrees 10x 2     Shoulder Exercises:  Standing   Flexion AAROM;Left;10 reps  On finger ladder   Other Standing Exercises UE ranger forward flex 2x10, circles 20x each     Shoulder Exercises: Pulleys   Flexion 3 minutes     Moist Heat Therapy   Number Minutes Moist Heat 15 Minutes   Moist Heat Location Shoulder     Manual Therapy   Manual Therapy Passive ROM;Soft tissue mobilization   Soft tissue mobilization anterior glenohumeral joint   Passive ROM Lt shoulder P/ROM into IR/ER and flexion to tolerance                  PT Short Term Goals - 03/03/16 1116      PT SHORT TERM GOAL #5   Title report > or = to 50% use of Rt UE with self-care and ADLs   Status Achieved     PT SHORT TERM GOAL #6   Title report < or = to 5/10 Lt shoulder pain with daily activity   Status Achieved           PT Long Term Goals - 01/28/16 1507      PT LONG TERM GOAL #1   Title be independent in advanced HEP   Time 8   Period Weeks   Status On-going     PT LONG TERM GOAL #2   Title reduce FOTO to < or = to 55% limitation   Time 8   Period Weeks   Status On-going     PT LONG TERM GOAL #3   Title demonstrate Lt shoulder P/ROM flexion to > or = to 110 degrees to improve use   Time 8   Period Weeks   Status On-going     PT LONG TERM GOAL #4   Title demonstrate Lt shoulder P/ROM IR/ER to > or = to 45 degrees to improve use   Time 8   Period Days   Status On-going     PT LONG TERM GOAL #5   Title demonstrate Rt shoulder IR to L2 to improve use with self-care   Time 8   Period Weeks   Status On-going     PT LONG TERM GOAL #6   Title report < or = to 4/10 Rt and Lt shoulder pain with daily use   Time 8   Status On-going               Plan - 03/03/16 1118    Clinical Impression Statement Pt with fluctuating Lt shoulder pain levels depending on activity.  Pt with limited tolerance for exercise due to pain with movement today.  Pt reports  that Lt shoulder feels better overall and she has been in a  flare-up all week.  Pt will continue to benefit from skilled PT to follow post op protocol for safe retun to regular use of Lt UE.     Rehab Potential Good   PT Frequency 2x / week   PT Duration 8 weeks   PT Treatment/Interventions ADLs/Self Care Home Management;Electrical Stimulation;Cryotherapy;Functional mobility training;Moist Heat;Therapeutic activities;Therapeutic exercise;Neuromuscular re-education;Patient/family education;Passive range of motion;Scar mobilization;Manual techniques;Vasopneumatic Device;Taping   PT Next Visit Plan Rt shoulder A/ROM and strength progression as tolerated, follow post-op protocol for Lt shoulder.   Consulted and Agree with Plan of Care Patient      Patient will benefit from skilled therapeutic intervention in order to improve the following deficits and impairments:  Decreased range of motion, Pain, Increased muscle spasms, Decreased endurance, Decreased activity tolerance, Postural dysfunction, Increased edema, Decreased strength, Impaired flexibility, Decreased scar mobility, Impaired UE functional use  Visit Diagnosis: Acute pain of left shoulder  Stiffness of left shoulder, not elsewhere classified  Muscle weakness (generalized)  Stiffness of right shoulder, not elsewhere classified  Acute pain of right shoulder     Problem List Patient Active Problem List   Diagnosis Date Noted  . History of palpitations 09/16/2015  . Pre-operative cardiovascular examination 09/16/2015  . Bipolar 1 disorder ([place]) 09/16/2015  . Palpitations 08/28/2014  . Hyperlipidemia LDL goal <70 11/26/2013  . History of breast cancer 11/16/2012  . Fibromyalgia 11/16/2012  . History of tobacco abuse 11/16/2012  . MRSA 04/13/2010  . INTERTRIGO 04/13/2010  . GROUP B STREPTOCOCCUS INFECTION, HX OF 04/13/2010  . HYPERLIPIDEMIA 09/02/2006  . DEPRESSION 09/02/2006  . Essential hypertension 09/02/2006  . COPD 09/02/2006  . GERD 09/02/2006    Sigurd Sos, PT 03/03/16  11:36 AM  [facility] [address] [place], [place], [address] Phone: [phone]   Fax:  [phone]  Name: Anita Arroyo MRN: [id number] Date of Birth: 12-13-1960

## 2016-03-08 ENCOUNTER — Ambulatory Visit: Payer: Commercial Managed Care - HMO | Admitting: Physical Therapy

## 2016-03-08 ENCOUNTER — Encounter: Payer: Self-pay | Admitting: Physical Therapy

## 2016-03-08 DIAGNOSIS — M25611 Stiffness of right shoulder, not elsewhere classified: Secondary | ICD-10-CM | POA: Diagnosis not present

## 2016-03-08 DIAGNOSIS — M25512 Pain in left shoulder: Secondary | ICD-10-CM | POA: Diagnosis not present

## 2016-03-08 DIAGNOSIS — M25612 Stiffness of left shoulder, not elsewhere classified: Secondary | ICD-10-CM

## 2016-03-08 DIAGNOSIS — M6281 Muscle weakness (generalized): Secondary | ICD-10-CM

## 2016-03-08 DIAGNOSIS — M25511 Pain in right shoulder: Secondary | ICD-10-CM | POA: Diagnosis not present

## 2016-03-08 NOTE — Therapy (Signed)
[facility] [address] [place], [place], [address] Phone: [phone]   Fax:  [phone]  Physical Therapy Treatment  Patient Details  Name: Anita Arroyo MRN: [id number] Date of Birth: 07-02-1960 Referring Provider: Kathryne Hitch, MD  Encounter Date: 03/08/2016      PT End of Session - 03/08/16 1105    Visit Number 14   Number of Visits 20   Date for PT Re-Evaluation 04/19/16   PT Start Time 1100   PT Stop Time 1200   PT Time Calculation (min) 60 min   Activity Tolerance Patient tolerated treatment well   Behavior During Therapy [facility] for tasks assessed/performed      Past Medical History:  Diagnosis Date  . ADHD (attention deficit hyperactivity disorder)   . Arthritis   . Asthma   . Bipolar 1 disorder ([place])   . Bipolar 1 disorder ([place])   . Breast cancer ([place])   . Depression   . Dysrhythmia    palpitations  . Dysrhythmia    atrial fibrillation  . Fibromyalgia   . GERD (gastroesophageal reflux disease)   . Heart murmur   . History of stress test 04/28/2011   showed normal perfusion without scar or ischemia  . Hx of echocardiogram 04/28/2011   showed normal systolic function with mild diastolic dysfunction,she had trace MR but did not have frank mitral valve prolapse demonstrated. She had mild pulmonary hypertension with an estimated RV systolic pressure at 34 mm.  . Hyperlipemia   . Meniere disease   . MVP (mitral valve prolapse)   . Neuromuscular disorder ([place])    fibromyalgia    Past Surgical History:  Procedure Laterality Date  . ANKLE SURGERY     left x4  . BREAST SURGERY  2011   lumpectomy right breast   . COLONOSCOPY WITH PROPOFOL N/A 02/25/2014   Procedure: COLONOSCOPY WITH PROPOFOL;  Surgeon: Garlan Fair, MD;  Location: [facility];  Service: Endoscopy;  Laterality: N/A;  . deviated septum    . ECTOPIC PREGNANCY SURGERY    . ELBOW SURGERY     right elbow  . EYE SURGERY     cataract surgery  . fibroid     2-3 fibroid adenomas removed  . JOINT REPLACEMENT  2011   right knee   . KNEE SURGERY     left knee  . SHOULDER ARTHROSCOPY WITH BICEPSTENOTOMY Right 09/25/2015   Procedure: SHOULDER ARTHROSCOPY WITH BICEPSTENOTOMY;  Surgeon: Ninetta Lights, MD;  Location: [place];  Service: Orthopedics;  Laterality: Right;  . SHOULDER ARTHROSCOPY WITH DISTAL CLAVICLE RESECTION Right 09/25/2015   Procedure: SHOULDER ARTHROSCOPY WITH DISTAL CLAVICLE RESECTION;  Surgeon: Ninetta Lights, MD;  Location: [place];  Service: Orthopedics;  Laterality: Right;  . SHOULDER ARTHROSCOPY WITH SUBACROMIAL DECOMPRESSION Right 09/25/2015   Procedure: RIGHT SHOULDER ARTHROSCOPY DEBRIDEMENT,ACROMIOPLASTY,DISTAL CLAVICAL EXCISION, RELEASE OF BICEPS TENDON;  Surgeon: Ninetta Lights, MD;  Location: [place];  Service: Orthopedics;  Laterality: Right;  . TMJ ARTHROPLASTY    . TONSILLECTOMY    . TOTAL SHOULDER ARTHROPLASTY Left 01/05/2016  . UTERINE FIBROID SURGERY     polups and fibroids removed   . WRIST SURGERY     left    There were no vitals filed for this visit.      Subjective Assessment - 03/08/16 1448    Subjective Doing better, I realized how weak I am when I tried to folding this weekend.  Currently in Pain? No/denies   Pain Frequency Intermittent   Aggravating Factors  Overusing it   Pain Relieving Factors meds, heat, massage            [facility] PT Assessment - 03/08/16 0001      Assessment   Medical Diagnosis s/p acromioplasty Rt, SAD   Referring Provider Kathryne Hitch, MD   Onset Date/Surgical Date 09/25/15     AROM   Right Shoulder Flexion 122 Degrees   Right Shoulder ABduction 80 Degrees   Right Shoulder External Rotation --  Can freely touch the back of her head   Left Shoulder Flexion 100 Degrees     PROM   Left Shoulder Flexion 115 Degrees   Left Shoulder External Rotation 40 Degrees     Strength    Overall Strength --  RT shoulder grossly 4-/5: LT shoulder 3-/5 minus ER 2+/5                     [facility] Adult PT Treatment/Exercise - 03/08/16 0001      Shoulder Exercises: Standing   External Rotation Strengthening;Right;20 reps;Theraband   Theraband Level (Shoulder External Rotation) Level 2 (Red)   Extension Strengthening;Both;10 reps;Theraband   Theraband Level (Shoulder Extension) Level 2 (Red)   Row Strengthening;Both;10 reps;Theraband   Theraband Level (Shoulder Row) Level 2 (Red)     Shoulder Exercises: Pulleys   Flexion 3 minutes     Moist Heat Therapy   Number Minutes Moist Heat 15 Minutes   Moist Heat Location --  Bil shoulder     Manual Therapy   Manual Therapy Passive ROM;Soft tissue mobilization   Soft tissue mobilization LT shoulder, pectoral, and  upper arm.   Passive ROM Lt shoulder P/ROM into IR/ER and flexion to tolerance                  PT Short Term Goals - 03/08/16 1449      PT SHORT TERM GOAL #1   Title be independent in initial HEP   Time 4   Period Weeks   Status Achieved     PT SHORT TERM GOAL #2   Title demonstrate Rt shoulder AROM flexion to > or = to 120 degrees to improve reacing overhead   Time 4   Period Weeks   Status Achieved     PT SHORT TERM GOAL #3   Title demonstrate Lt shoulder PROM flexion to 90 degrees to improve mobility and allow for use as able   Time 4   Period Weeks   Status Achieved     PT SHORT TERM GOAL #4   Title demonstrate 4+/5 Rt shoulder strength to improve use   Time 4   Period Days   Status On-going           PT Long Term Goals - 01/28/16 1507      PT LONG TERM GOAL #1   Title be independent in advanced HEP   Time 8   Period Weeks   Status On-going     PT LONG TERM GOAL #2   Title reduce FOTO to < or = to 55% limitation   Time 8   Period Weeks   Status On-going     PT LONG TERM GOAL #3   Title demonstrate Lt shoulder P/ROM flexion to > or = to 110 degrees to  improve use   Time 8   Period Weeks   Status On-going     PT LONG TERM  GOAL #4   Title demonstrate Lt shoulder P/ROM IR/ER to > or = to 45 degrees to improve use   Time 8   Period Days   Status On-going     PT LONG TERM GOAL #5   Title demonstrate Rt shoulder IR to L2 to improve use with self-care   Time 8   Period Weeks   Status On-going     PT LONG TERM GOAL #6   Title report < or = to 4/10 Rt and Lt shoulder pain with daily use   Time 8   Status On-going               Plan - 03/08/16 1444    Clinical Impression Statement Bilateral shoulder AROM improving, remains with shoulder weakness LT >RT. Pt is able to put tops on over her head now and perform more daily activities like folding sheets/clothing.  She cannot put her hair in a ponytail yet.  Pain somewhat fluctuates but seems tied to when she overdoes activity.    Rehab Potential Good   PT Frequency 2x / week   PT Duration 8 weeks   PT Treatment/Interventions ADLs/Self Care Home Management;Electrical Stimulation;Cryotherapy;Functional mobility training;Moist Heat;Therapeutic activities;Therapeutic exercise;Neuromuscular re-education;Patient/family education;Passive range of motion;Scar mobilization;Manual techniques;Vasopneumatic Device;Taping   PT Next Visit Plan Rt shoulder A/ROM and strength progression as tolerated, follow post-op protocol for Lt shoulder.      Patient will benefit from skilled therapeutic intervention in order to improve the following deficits and impairments:  Decreased range of motion, Pain, Increased muscle spasms, Decreased endurance, Decreased activity tolerance, Postural dysfunction, Increased edema, Decreased strength, Impaired flexibility, Decreased scar mobility, Impaired UE functional use  Visit Diagnosis: Acute pain of left shoulder  Stiffness of left shoulder, not elsewhere classified  Muscle weakness (generalized)     Problem List Patient Active Problem List   Diagnosis  Date Noted  . History of palpitations 09/16/2015  . Pre-operative cardiovascular examination 09/16/2015  . Bipolar 1 disorder ([place]) 09/16/2015  . Palpitations 08/28/2014  . Hyperlipidemia LDL goal <70 11/26/2013  . History of breast cancer 11/16/2012  . Fibromyalgia 11/16/2012  . History of tobacco abuse 11/16/2012  . MRSA 04/13/2010  . INTERTRIGO 04/13/2010  . GROUP B STREPTOCOCCUS INFECTION, HX OF 04/13/2010  . HYPERLIPIDEMIA 09/02/2006  . DEPRESSION 09/02/2006  . Essential hypertension 09/02/2006  . COPD 09/02/2006  . GERD 09/02/2006    Myrene Galas, PTA 03/08/16 4:22 PM   [facility] [address], [place], [place], [address] Phone: [phone]   Fax:  [phone]  Name: CAMELLA TETTEH MRN: [id number] Date of Birth: 15-Jan-1961

## 2016-03-09 NOTE — Addendum Note (Signed)
Addended by: Danie Binder on: 03/09/2016 07:17 AM   Modules accepted: Orders

## 2016-03-10 ENCOUNTER — Encounter: Payer: Self-pay | Admitting: Physical Therapy

## 2016-03-10 ENCOUNTER — Ambulatory Visit: Payer: Commercial Managed Care - HMO | Admitting: Physical Therapy

## 2016-03-10 DIAGNOSIS — M25511 Pain in right shoulder: Secondary | ICD-10-CM | POA: Diagnosis not present

## 2016-03-10 DIAGNOSIS — M6281 Muscle weakness (generalized): Secondary | ICD-10-CM | POA: Diagnosis not present

## 2016-03-10 DIAGNOSIS — M25612 Stiffness of left shoulder, not elsewhere classified: Secondary | ICD-10-CM | POA: Diagnosis not present

## 2016-03-10 DIAGNOSIS — M25512 Pain in left shoulder: Secondary | ICD-10-CM

## 2016-03-10 DIAGNOSIS — M25611 Stiffness of right shoulder, not elsewhere classified: Secondary | ICD-10-CM | POA: Diagnosis not present

## 2016-03-10 NOTE — Therapy (Signed)
[facility] [address], [address] [place], [place], [address] Phone: [phone]   Fax:  [phone]  Physical Therapy Treatment  Patient Details  Name: Anita Arroyo MRN: [id number] Date of Birth: 11-22-1960 Referring Provider: Mckinley Jewel, MD  Encounter Date: 03/10/2016      PT End of Session - 03/10/16 1101    Visit Number 15   Number of Visits 20   Date for PT Re-Evaluation 04/19/16   PT Start Time 1101   PT Stop Time 1200   PT Time Calculation (min) 59 min   Activity Tolerance Patient tolerated treatment well   Behavior During Therapy [facility] for tasks assessed/performed      Past Medical History:  Diagnosis Date  . ADHD (attention deficit hyperactivity disorder)   . Arthritis   . Asthma   . Bipolar 1 disorder (HCC)   . Bipolar 1 disorder (HCC)   . Breast cancer (HCC)   . Depression   . Dysrhythmia    palpitations  . Dysrhythmia    atrial fibrillation  . Fibromyalgia   . GERD (gastroesophageal reflux disease)   . Heart murmur   . History of stress test 04/28/2011   showed normal perfusion without scar or ischemia  . Hx of echocardiogram 04/28/2011   showed normal systolic function with mild diastolic dysfunction,she had trace MR but did not have frank mitral valve prolapse demonstrated. She had mild pulmonary hypertension with an estimated RV systolic pressure at 34 mm.  . Hyperlipemia   . Meniere disease   . MVP (mitral valve prolapse)   . Neuromuscular disorder (HCC)    fibromyalgia    Past Surgical History:  Procedure Laterality Date  . ANKLE SURGERY     left x4  . BREAST SURGERY  2011   lumpectomy right breast   . COLONOSCOPY WITH PROPOFOL N/A 02/25/2014   Procedure: COLONOSCOPY WITH PROPOFOL;  Surgeon: Charolett Bumpers, MD;  Location: [facility];  Service: Endoscopy;  Laterality: N/A;  . deviated septum    . ECTOPIC PREGNANCY SURGERY    . ELBOW SURGERY     right elbow  . EYE SURGERY     cataract surgery  . fibroid     2-3 fibroid adenomas removed  . JOINT REPLACEMENT  2011   right knee   . KNEE SURGERY     left knee  . SHOULDER ARTHROSCOPY WITH BICEPSTENOTOMY Right 09/25/2015   Procedure: SHOULDER ARTHROSCOPY WITH BICEPSTENOTOMY;  Surgeon: Loreta Ave, MD;  Location: [facility];  Service: Orthopedics;  Laterality: Right;  . SHOULDER ARTHROSCOPY WITH DISTAL CLAVICLE RESECTION Right 09/25/2015   Procedure: SHOULDER ARTHROSCOPY WITH DISTAL CLAVICLE RESECTION;  Surgeon: Loreta Ave, MD;  Location: [facility];  Service: Orthopedics;  Laterality: Right;  . SHOULDER ARTHROSCOPY WITH SUBACROMIAL DECOMPRESSION Right 09/25/2015   Procedure: RIGHT SHOULDER ARTHROSCOPY DEBRIDEMENT,ACROMIOPLASTY,DISTAL CLAVICAL EXCISION, RELEASE OF BICEPS TENDON;  Surgeon: Loreta Ave, MD;  Location: [facility];  Service: Orthopedics;  Laterality: Right;  . TMJ ARTHROPLASTY    . TONSILLECTOMY    . TOTAL SHOULDER ARTHROPLASTY Left 01/05/2016  . UTERINE FIBROID SURGERY     polups and fibroids removed   . WRIST SURGERY     left    There were no vitals filed for this visit.      Subjective Assessment - 03/10/16 1103    Subjective I got my hair in a pony tail this AM, it was super hard but I  did it.    Currently in Pain? --  RT 3/10, LT 6/10   Multiple Pain Sites No                         [facility] Adult PT Treatment/Exercise - 03/10/16 0001      Shoulder Exercises: Sidelying   External Rotation AROM;Strengthening;Left;20 reps   ABduction AROM;Strengthening;Left;20 reps     Shoulder Exercises: Standing   Flexion --  Finger ladder RT 1 1/2 # wt 10x, LT 0# 10x   Extension Strengthening;Both;20 reps;Theraband   Theraband Level (Shoulder Extension) Level 2 (Red)   Row Strengthening;Both;20 reps;Theraband   Theraband Level (Shoulder Row) Level 2 (Red)     Shoulder Exercises: Pulleys   Flexion 3 minutes     Moist Heat  Therapy   Number Minutes Moist Heat 15 Minutes   Moist Heat Location --  Bil shoulder     Manual Therapy   Manual Therapy Passive ROM;Soft tissue mobilization   Soft tissue mobilization LT shoulder, pectoral, and  upper arm.   Passive ROM Lt shoulder P/ROM into IR/ER and flexion to tolerance                  PT Short Term Goals - 03/08/16 1449      PT SHORT TERM GOAL #1   Title be independent in initial HEP   Time 4   Period Weeks   Status Achieved     PT SHORT TERM GOAL #2   Title demonstrate Rt shoulder AROM flexion to > or = to 120 degrees to improve reacing overhead   Time 4   Period Weeks   Status Achieved     PT SHORT TERM GOAL #3   Title demonstrate Lt shoulder PROM flexion to 90 degrees to improve mobility and allow for use as able   Time 4   Period Weeks   Status Achieved     PT SHORT TERM GOAL #4   Title demonstrate 4+/5 Rt shoulder strength to improve use   Time 4   Period Days   Status On-going           PT Long Term Goals - 01/28/16 1507      PT LONG TERM GOAL #1   Title be independent in advanced HEP   Time 8   Period Weeks   Status On-going     PT LONG TERM GOAL #2   Title reduce FOTO to < or = to 55% limitation   Time 8   Period Weeks   Status On-going     PT LONG TERM GOAL #3   Title demonstrate Lt shoulder P/ROM flexion to > or = to 110 degrees to improve use   Time 8   Period Weeks   Status On-going     PT LONG TERM GOAL #4   Title demonstrate Lt shoulder P/ROM IR/ER to > or = to 45 degrees to improve use   Time 8   Period Days   Status On-going     PT LONG TERM GOAL #5   Title demonstrate Rt shoulder IR to L2 to improve use with self-care   Time 8   Period Weeks   Status On-going     PT LONG TERM GOAL #6   Title report < or = to 4/10 Rt and Lt shoulder pain with daily use   Time 8   Status On-going  Plan - 03/10/16 1148    Clinical Impression Statement Pt was able to get her hair up in  her pony tail today despite being very difficult. Progressing with her exercises nicely: today she was able to in sidelying perform active external rotation with significantly improved mechanics and strength as well as abduction. Pain did not limit treatment today.    Rehab Potential Good   PT Frequency 2x / week   PT Duration 6 weeks   PT Treatment/Interventions ADLs/Self Care Home Management;Electrical Stimulation;Cryotherapy;Functional mobility training;Moist Heat;Therapeutic activities;Therapeutic exercise;Neuromuscular re-education;Patient/family education;Passive range of motion;Scar mobilization;Manual techniques;Vasopneumatic Device;Taping   PT Next Visit Plan Rt shoulder A/ROM and strength progression as tolerated, follow post-op protocol for Lt shoulder.   Consulted and Agree with Plan of Care Patient      Patient will benefit from skilled therapeutic intervention in order to improve the following deficits and impairments:  Decreased range of motion, Pain, Increased muscle spasms, Decreased endurance, Decreased activity tolerance, Postural dysfunction, Increased edema, Decreased strength, Impaired flexibility, Decreased scar mobility, Impaired UE functional use  Visit Diagnosis: Acute pain of left shoulder  Stiffness of left shoulder, not elsewhere classified  Muscle weakness (generalized)  Stiffness of right shoulder, not elsewhere classified     Problem List Patient Active Problem List   Diagnosis Date Noted  . History of palpitations 09/16/2015  . Pre-operative cardiovascular examination 09/16/2015  . Bipolar 1 disorder (HCC) 09/16/2015  . Palpitations 08/28/2014  . Hyperlipidemia LDL goal <70 11/26/2013  . History of breast cancer 11/16/2012  . Fibromyalgia 11/16/2012  . History of tobacco abuse 11/16/2012  . MRSA 04/13/2010  . INTERTRIGO 04/13/2010  . GROUP B STREPTOCOCCUS INFECTION, HX OF 04/13/2010  . HYPERLIPIDEMIA 09/02/2006  . DEPRESSION 09/02/2006  .  Essential hypertension 09/02/2006  . COPD 09/02/2006  . GERD 09/02/2006    Tahtiana Rozier, PTA 03/10/2016, 12:34 PM  [facility] [address] [place], [place], [address] Phone: [phone]   Fax:  [phone]  Name: Anita Arroyo MRN: [id number] Date of Birth: [birth year]

## 2016-03-13 ENCOUNTER — Emergency Department (HOSPITAL_COMMUNITY)
Admission: EM | Admit: 2016-03-13 | Discharge: 2016-03-13 | Disposition: A | Discharge status: Home / Self Care | Payer: Medicare HMO | Attending: Emergency Medicine | Admitting: Emergency Medicine

## 2016-03-13 ENCOUNTER — Encounter (HOSPITAL_COMMUNITY): Payer: Self-pay | Admitting: Emergency Medicine

## 2016-03-13 DIAGNOSIS — Z23 Encounter for immunization: Secondary | ICD-10-CM

## 2016-03-13 DIAGNOSIS — L03115 Cellulitis of right lower limb: Secondary | ICD-10-CM | POA: Diagnosis not present

## 2016-03-13 DIAGNOSIS — J45909 Unspecified asthma, uncomplicated: Secondary | ICD-10-CM | POA: Diagnosis not present

## 2016-03-13 DIAGNOSIS — I10 Essential (primary) hypertension: Secondary | ICD-10-CM | POA: Insufficient documentation

## 2016-03-13 DIAGNOSIS — Z853 Personal history of malignant neoplasm of breast: Secondary | ICD-10-CM | POA: Diagnosis not present

## 2016-03-13 DIAGNOSIS — Z96611 Presence of right artificial shoulder joint: Secondary | ICD-10-CM | POA: Diagnosis not present

## 2016-03-13 DIAGNOSIS — F909 Attention-deficit hyperactivity disorder, unspecified type: Secondary | ICD-10-CM | POA: Diagnosis not present

## 2016-03-13 DIAGNOSIS — J449 Chronic obstructive pulmonary disease, unspecified: Secondary | ICD-10-CM | POA: Diagnosis not present

## 2016-03-13 DIAGNOSIS — Z96651 Presence of right artificial knee joint: Secondary | ICD-10-CM | POA: Insufficient documentation

## 2016-03-13 DIAGNOSIS — F17210 Nicotine dependence, cigarettes, uncomplicated: Secondary | ICD-10-CM | POA: Diagnosis not present

## 2016-03-13 DIAGNOSIS — Z203 Contact with and (suspected) exposure to rabies: Secondary | ICD-10-CM | POA: Diagnosis not present

## 2016-03-13 DIAGNOSIS — W5501XA Bitten by cat, initial encounter: Secondary | ICD-10-CM

## 2016-03-13 DIAGNOSIS — S71152A Open bite, left thigh, initial encounter: Secondary | ICD-10-CM | POA: Diagnosis not present

## 2016-03-13 LAB — CBC WITH DIFFERENTIAL/PLATELET
BASOS ABS: 0 10*3/uL (ref 0.0–0.1)
BASOS PCT: 0 %
EOS PCT: 0 %
Eosinophils Absolute: 0 10*3/uL (ref 0.0–0.7)
HEMATOCRIT: 43.4 % (ref 36.0–46.0)
Hemoglobin: 14.9 g/dL (ref 12.0–15.0)
Lymphocytes Relative: 23 %
Lymphs Abs: 2.4 10*3/uL (ref 0.7–4.0)
MCH: 30.6 pg (ref 26.0–34.0)
MCHC: 34.3 g/dL (ref 30.0–36.0)
MCV: 89.1 fL (ref 78.0–100.0)
MONO ABS: 0.9 10*3/uL (ref 0.1–1.0)
MONOS PCT: 8 %
Neutro Abs: 7.3 10*3/uL (ref 1.7–7.7)
Neutrophils Relative %: 69 %
PLATELETS: 186 10*3/uL (ref 150–400)
RBC: 4.87 MIL/uL (ref 3.87–5.11)
RDW: 13.4 % (ref 11.5–15.5)
WBC: 10.6 10*3/uL — ABNORMAL HIGH (ref 4.0–10.5)

## 2016-03-13 LAB — BASIC METABOLIC PANEL
ANION GAP: 13 (ref 5–15)
BUN: 9 mg/dL (ref 6–20)
CALCIUM: 10 mg/dL (ref 8.9–10.3)
CO2: 25 mmol/L (ref 22–32)
CREATININE: 0.91 mg/dL (ref 0.44–1.00)
Chloride: 98 mmol/L — ABNORMAL LOW (ref 101–111)
GFR calc Af Amer: >60 mL/min (ref >60)
Glucose, Bld: 82 mg/dL (ref 65–99)
Potassium: 3.5 mmol/L (ref 3.5–5.1)
Sodium: 136 mmol/L (ref 135–145)

## 2016-03-13 MED ORDER — HYDROCODONE-ACETAMINOPHEN 5-325 MG PO TABS: ORAL_TABLET | ORAL | 0 refills | Status: DC

## 2016-03-13 MED ORDER — RABIES IMMUNE GLOBULIN 150 UNIT/ML IM INJ
20.0000 [IU]/kg | INJECTION | Freq: Once | INTRAMUSCULAR | Status: AC
  Administered 2016-03-13: 1575 [IU]
  Filled 2016-03-13: qty 10.5

## 2016-03-13 MED ORDER — LIDOCAINE-EPINEPHRINE-TETRACAINE (LET) SOLUTION
3.0000 mL | Freq: Once | NASAL | Status: AC
  Administered 2016-03-13: 3 mL via TOPICAL
  Filled 2016-03-13: qty 3

## 2016-03-13 MED ORDER — RABIES VACCINE, PCEC IM SUSR
1.0000 mL | Freq: Once | INTRAMUSCULAR | Status: AC
  Administered 2016-03-13: 1 mL via INTRAMUSCULAR
  Filled 2016-03-13: qty 1

## 2016-03-13 MED ORDER — DOXYCYCLINE HYCLATE 100 MG IV SOLR
100.0000 mg | Freq: Once | INTRAVENOUS | Status: AC
  Administered 2016-03-13: 100 mg via INTRAVENOUS
  Filled 2016-03-13: qty 100

## 2016-03-13 MED ORDER — MORPHINE SULFATE (PF) 4 MG/ML IV SOLN
4.0000 mg | Freq: Once | INTRAVENOUS | Status: AC
  Administered 2016-03-13: 4 mg via INTRAVENOUS
  Filled 2016-03-13: qty 1

## 2016-03-13 MED ORDER — DOXYCYCLINE HYCLATE 100 MG PO CAPS: 100.0000 mg | ORAL_CAPSULE | Freq: Two times a day (BID) | ORAL | 0 refills | Status: DC

## 2016-03-13 MED ORDER — CLINDAMYCIN HCL 150 MG PO CAPS: 450.0000 mg | ORAL_CAPSULE | Freq: Three times a day (TID) | ORAL | 0 refills | Status: DC

## 2016-03-13 MED ORDER — TETANUS-DIPHTH-ACELL PERTUSSIS 5-2.5-18.5 LF-MCG/0.5 IM SUSP
0.5000 mL | Freq: Once | INTRAMUSCULAR | Status: AC
  Administered 2016-03-13: 0.5 mL via INTRAMUSCULAR
  Filled 2016-03-13: qty 0.5

## 2016-03-13 MED ORDER — KETOROLAC TROMETHAMINE 30 MG/ML IJ SOLN
30.0000 mg | Freq: Once | INTRAMUSCULAR | Status: AC
  Administered 2016-03-13: 30 mg via INTRAVENOUS
  Filled 2016-03-13: qty 1

## 2016-03-13 MED ORDER — PROCHLORPERAZINE EDISYLATE 5 MG/ML IJ SOLN
10.0000 mg | Freq: Once | INTRAMUSCULAR | Status: AC
  Administered 2016-03-13: 10 mg via INTRAVENOUS
  Filled 2016-03-13: qty 2

## 2016-03-13 MED ORDER — ONDANSETRON HCL 4 MG/2ML IJ SOLN
4.0000 mg | Freq: Once | INTRAMUSCULAR | Status: AC
  Administered 2016-03-13: 4 mg via INTRAVENOUS
  Filled 2016-03-13: qty 2

## 2016-03-13 MED ORDER — CLINDAMYCIN PHOSPHATE 900 MG/50ML IV SOLN
900.0000 mg | Freq: Once | INTRAVENOUS | Status: AC
  Administered 2016-03-13 (×2): 900 mg via INTRAVENOUS
  Filled 2016-03-13: qty 50

## 2016-03-13 NOTE — Discharge Instructions (Signed)
Please return to the emergency department tomorrow evening for a wound recheck.  Do not hesitate to return to the emergency room for any new or worsening symptoms including fever, nausea vomiting are significantly worsening pain or spreading redness.    Please go to [facility] urgent care for your rabies booster shots as follows:  DAY 0:  03/13/2016      DAY 3:  03/16/2016       DAY 7:  03/20/2016     DAY 14:  03/27/2016

## 2016-03-13 NOTE — ED Notes (Signed)
Unable to d/c patient.  Patient currently has medications running.

## 2016-03-13 NOTE — ED Provider Notes (Signed)
[facility] DEPT Provider Note   CSN: [id number] Arrival date & time: 03/13/16  1542     History   Chief Complaint Chief Complaint  Patient presents with  . Animal Bite   HPI   Blood pressure 135/70, pulse 80, temperature 99.8 F (37.7 C), temperature source Oral, resp. rate 20, weight 78.5 kg, last menstrual period 06/25/2011, SpO2 97 %.  Anita Arroyo is a 56 y.o. female complaining of worsening bite and scratch to posterior left leg sustained 4 days ago. Patient states she was chasing off a stray cat out of her yard, she turned around and was returning back to the house and the cat attacked her. She is unsure when her last tetanus shot was. She had a friend who is a Marine scientist clean and dress the wound yesterday when she went back to have the nurse change the dressing she was advised to present to the emergency department due to significantly worsening cellulitis. She states that the pain has increased sharply over the last 24 hours. She is not a diabetic. She denies fevers, chills, nausea, vomiting. She had a total shoulder replacement in the last 3 months and she has a total knee replacement on the affected lower extremity in the remote past. She does not have any reduced range of motion in the affected nonaerated joints.   Past Medical History:  Diagnosis Date  . ADHD (attention deficit hyperactivity disorder)   . Arthritis   . Asthma   . Bipolar 1 disorder ([place])   . Bipolar 1 disorder ([place])   . Breast cancer ([place])   . Depression   . Dysrhythmia    palpitations  . Dysrhythmia    atrial fibrillation  . Fibromyalgia   . GERD (gastroesophageal reflux disease)   . Heart murmur   . History of stress test 04/28/2011   showed normal perfusion without scar or ischemia  . Hx of echocardiogram 04/28/2011   showed normal systolic function with mild diastolic dysfunction,she had trace MR but did not have frank mitral valve prolapse demonstrated. She had mild pulmonary hypertension with  an estimated RV systolic pressure at 34 mm.  . Hyperlipemia   . Meniere disease   . MVP (mitral valve prolapse)   . Neuromuscular disorder [facility])    fibromyalgia    Patient Active Problem List   Diagnosis Date Noted  . History of palpitations 09/16/2015  . Pre-operative cardiovascular examination 09/16/2015  . Bipolar 1 disorder ([place]) 09/16/2015  . Palpitations 08/28/2014  . Hyperlipidemia LDL goal <70 11/26/2013  . History of breast cancer 11/16/2012  . Fibromyalgia 11/16/2012  . History of tobacco abuse 11/16/2012  . MRSA 04/13/2010  . INTERTRIGO 04/13/2010  . GROUP B STREPTOCOCCUS INFECTION, HX OF 04/13/2010  . HYPERLIPIDEMIA 09/02/2006  . DEPRESSION 09/02/2006  . Essential hypertension 09/02/2006  . COPD 09/02/2006  . GERD 09/02/2006    Past Surgical History:  Procedure Laterality Date  . ANKLE SURGERY     left x4  . BREAST SURGERY  2011   lumpectomy right breast   . COLONOSCOPY WITH PROPOFOL N/A 02/25/2014   Procedure: COLONOSCOPY WITH PROPOFOL;  Surgeon: Garlan Fair, MD;  Location: [facility];  Service: Endoscopy;  Laterality: N/A;  . deviated septum    . ECTOPIC PREGNANCY SURGERY    . ELBOW SURGERY     right elbow  . EYE SURGERY     cataract surgery  . fibroid     2-3 fibroid adenomas removed  . JOINT REPLACEMENT  2011   right knee   . KNEE SURGERY     left knee  . SHOULDER ARTHROSCOPY WITH BICEPSTENOTOMY Right 09/25/2015   Procedure: SHOULDER ARTHROSCOPY WITH BICEPSTENOTOMY;  Surgeon: Ninetta Lights, MD;  [facility];  Service: Orthopedics;  Laterality: Right;  . SHOULDER ARTHROSCOPY WITH DISTAL CLAVICLE RESECTION Right 09/25/2015   Procedure: SHOULDER ARTHROSCOPY WITH DISTAL CLAVICLE RESECTION;  Surgeon: Ninetta Lights, MD;  Location: [facility];  Service: Orthopedics;  Laterality: Right;  . SHOULDER ARTHROSCOPY WITH SUBACROMIAL DECOMPRESSION Right 09/25/2015   Procedure: RIGHT SHOULDER ARTHROSCOPY  DEBRIDEMENT,ACROMIOPLASTY,DISTAL CLAVICAL EXCISION, RELEASE OF BICEPS TENDON;  Surgeon: Ninetta Lights, MD;  Location: [facility];  Service: Orthopedics;  Laterality: Right;  . TMJ ARTHROPLASTY    . TONSILLECTOMY    . TOTAL SHOULDER ARTHROPLASTY Left 01/05/2016  . UTERINE FIBROID SURGERY     polups and fibroids removed   . WRIST SURGERY     left    OB History    No data available       Home Medications    Prior to Admission medications   Medication Sig Start Date End Date Taking? Authorizing Provider  atenolol (TENORMIN) 50 MG tablet Take 50 mg by mouth 2 (two) times daily.   Yes Historical Provider, MD  Biotin (BIOTIN 5000) 5 MG CAPS Take 5 mg by mouth daily.   Yes Historical Provider, MD  cetirizine (ZYRTEC) 10 MG tablet Take 10 mg by mouth daily.    Yes Historical Provider, MD  fluticasone (FLONASE) 50 MCG/ACT nasal spray Place 1 spray into both nostrils 2 (two) times daily.    Yes Historical Provider, MD  Fluticasone-Salmeterol (ADVAIR) 250-50 MCG/DOSE AEPB Inhale 1 puff into the lungs 2 (two) times daily.   Yes Historical Provider, MD  lamoTRIgine (LAMICTAL) 150 MG tablet Take 1 tablet (150 mg total) by mouth 2 (two) times daily. 02/19/16  Yes Kathlee Nations, MD  Magnesium 250 MG TABS Take 250 mg by mouth daily.   Yes Historical Provider, MD  oxyCODONE-acetaminophen (PERCOCET/ROXICET) 5-325 MG tablet Take 1 tablet by mouth every 4 (four) hours as needed for severe pain.    Yes Historical Provider, MD  pantoprazole (PROTONIX) 40 MG tablet Take 40 mg by mouth daily.    Yes Historical Provider, MD  traZODone (DESYREL) 100 MG tablet Take 2 tablets (200 mg total) by mouth at bedtime. 02/19/16  Yes Kathlee Nations, MD  triamterene-hydrochlorothiazide (MAXZIDE-25) 37.5-25 MG tablet Take 1 tablet by mouth daily.   Yes Historical Provider, MD  venlafaxine XR (EFFEXOR-XR) 150 MG 24 hr capsule Take 150 mg by mouth at bedtime.   Yes Historical Provider, MD  clindamycin (CLEOCIN)  150 MG capsule Take 3 capsules (450 mg total) by mouth 3 (three) times daily. 03/13/16   Beatric Fulop, PA-C  doxycycline (VIBRAMYCIN) 100 MG capsule Take 1 capsule (100 mg total) by mouth 2 (two) times daily. 03/13/16   Oleva Koo, PA-C  HYDROcodone-acetaminophen (NORCO/VICODIN) 5-325 MG tablet Take 1-2 tablets by mouth every 6 hours as needed for pain and/or cough. 03/13/16   Elmyra Ricks Casimiro Lienhard, PA-C    Family History Family History  Problem Relation Age of Onset  . Mitral valve prolapse Mother   . Heart Problems Mother   . Cancer Mother     Social History Social History  Substance Use Topics  . Smoking status: Current Every Day Smoker    Packs/day: 1.50    Years: 40.00    Types:  Cigarettes  . Smokeless tobacco: Never Used     Comment: Cannot afford chantix. Working on getting discount from drug compny  . Alcohol use 0.0 oz/week     Comment: occ     Allergies   Contrast media [iodinated diagnostic agents]; Iodine; Adhesive [tape]; Amoxicillin-pot clavulanate; Ceclor [cefaclor]; Moxifloxacin; and Sulfonamide derivatives   Review of Systems Review of Systems  10 systems reviewed and found to be negative, except as noted in the HPI.  Physical Exam Updated Vital Signs BP 113/68 (BP Location: Left Arm)   Pulse 72   Temp 99.8 F (37.7 C) (Oral)   Resp 14   Wt 78.5 kg   LMP 06/25/2011   SpO2 98%   BMI 31.14 kg/m   Physical Exam  Constitutional: She is oriented to person, place, and time. She appears well-developed and well-nourished. No distress.  HENT:  Head: Normocephalic and atraumatic.  Mouth/Throat: Oropharynx is clear and moist.  Eyes: Conjunctivae and EOM are normal. Pupils are equal, round, and reactive to light.  Neck: Normal range of motion.  Cardiovascular: Normal rate, regular rhythm and intact distal pulses.   Pulmonary/Chest: Effort normal and breath sounds normal.  Abdominal: Soft. There is no tenderness.  Musculoskeletal: Normal range of  motion.  Neurological: She is alert and oriented to person, place, and time.  Skin: She is not diaphoretic. There is erythema.  Posterior right thigh: 15 x 11cm area of cellulitis as photographed with no focal fluctuance, multiple puncture. No discharge.  Psychiatric: She has a normal mood and affect.  Nursing note and vitals reviewed.      ED Treatments / Results  Labs (all labs ordered are listed, but only abnormal results are displayed) Labs Reviewed  CBC WITH DIFFERENTIAL/PLATELET - Abnormal; Notable for the following:       Result Value   WBC 10.6 (*)    All other components within normal limits  BASIC METABOLIC PANEL - Abnormal; Notable for the following:    Chloride 98 (*)    All other components within normal limits  CULTURE, BLOOD (ROUTINE X 2)  CULTURE, BLOOD (ROUTINE X 2)    EKG  EKG Interpretation None       Radiology No results found.  Procedures Procedures (including critical care time)  Medications Ordered in ED Medications  doxycycline (VIBRAMYCIN) 100 mg in dextrose 5 % 250 mL IVPB (100 mg Intravenous New Bag/Given 03/13/16 1758)  prochlorperazine (COMPAZINE) injection 10 mg (not administered)  ketorolac (TORADOL) 30 MG/ML injection 30 mg (not administered)  Tdap (BOOSTRIX) injection 0.5 mL (0.5 mLs Intramuscular Given 03/13/16 1736)  rabies vaccine (RABAVERT) injection 1 mL (1 mL Intramuscular Given 03/13/16 1748)  morphine 4 MG/ML injection 4 mg (4 mg Intravenous Given 03/13/16 1735)  ondansetron (ZOFRAN) injection 4 mg (4 mg Intravenous Given 03/13/16 1736)  lidocaine-EPINEPHrine-tetracaine (LET) solution (3 mLs Topical Given 03/13/16 1735)  clindamycin (CLEOCIN) IVPB 900 mg (900 mg Intravenous New Bag/Given 03/13/16 1758)  rabies immune globulin (HYPERAB) injection 1,575 Units (1,575 Units Infiltration Given 03/13/16 1758)     Initial Impression / Assessment and Plan / ED Course  I have reviewed the triage vital signs and the nursing  notes.  Pertinent labs & imaging results that were available during my care of the patient were reviewed by me and considered in my medical decision making (see chart for details).     Vitals:   03/13/16 1550 03/13/16 1553 03/13/16 1839  BP: 135/70  113/68  Pulse: 80  72  Resp:  20  14  Temp: 99.8 F (37.7 C)    TempSrc: Oral    SpO2: 97%  98%  Weight:  78.5 kg     Medications  doxycycline (VIBRAMYCIN) 100 mg in dextrose 5 % 250 mL IVPB (100 mg Intravenous New Bag/Given 03/13/16 1758)  prochlorperazine (COMPAZINE) injection 10 mg (not administered)  ketorolac (TORADOL) 30 MG/ML injection 30 mg (not administered)  Tdap (BOOSTRIX) injection 0.5 mL (0.5 mLs Intramuscular Given 03/13/16 1736)  rabies vaccine (RABAVERT) injection 1 mL (1 mL Intramuscular Given 03/13/16 1748)  morphine 4 MG/ML injection 4 mg (4 mg Intravenous Given 03/13/16 1735)  ondansetron (ZOFRAN) injection 4 mg (4 mg Intravenous Given 03/13/16 1736)  lidocaine-EPINEPHrine-tetracaine (LET) solution (3 mLs Topical Given 03/13/16 1735)  clindamycin (CLEOCIN) IVPB 900 mg (900 mg Intravenous New Bag/Given 03/13/16 1758)  rabies immune globulin (HYPERAB) injection 1,575 Units (1,575 Units Infiltration Given 03/13/16 1758)    Anita Arroyo is 56 y.o. female presenting with Area of cellulitis from unknown cat which either bit or scratched her posterior right thigh. This patient is not immunocompromised however she does have multiple nonnative joints one directly adjacent to the area of cellulitis. Patient afebrile and nontoxic appearing, mild leukocytosis of 10.6. Patient with multiple drug allergies, discussed with pharmacist Rachel Bo who recommends clindamycin and doxycycline for Pasteurella and group B strep coverage..  Rabies IgG is injected outside the area of cellulitis above the area. Patient counseled on vaccination series for rabies. Is asked her to return in 24 hours for a wound recheck and she should return sooner for any new  or worsening symptoms. Patient verbalized understanding and teach back technique.  Patient is also reporting exacerbation of chronic headache, nonfocal neurologic exam, Compazine and Toradol given for comfort.  Evaluation does not show pathology that would require ongoing emergent intervention or inpatient treatment. Pt is hemodynamically stable and mentating appropriately. Discussed findings and plan with patient/guardian, who agrees with care plan. All questions answered. Return precautions discussed and outpatient follow up given.    Final Clinical Impressions(s) / ED Diagnoses   Final diagnoses:  Infected cat bite, initial encounter  Need for rabies vaccination    New Prescriptions New Prescriptions   CLINDAMYCIN (CLEOCIN) 150 MG CAPSULE    Take 3 capsules (450 mg total) by mouth 3 (three) times daily.   DOXYCYCLINE (VIBRAMYCIN) 100 MG CAPSULE    Take 1 capsule (100 mg total) by mouth 2 (two) times daily.   HYDROCODONE-ACETAMINOPHEN (NORCO/VICODIN) 5-325 MG TABLET    Take 1-2 tablets by mouth every 6 hours as needed for pain and/or cough.     Monico Blitz, PA-C 03/13/16 [place], PA-C 03/13/16 1917    Lacretia Leigh, MD 03/16/16 [phone]

## 2016-03-13 NOTE — ED Triage Notes (Signed)
Per pt, states she was bitten on back of left upper thigh 2 days ago-recently had left shoulder surgery-surgeon gave her antibiotics-states not getting better-not sure if cat has had rabies shot

## 2016-03-14 ENCOUNTER — Emergency Department (HOSPITAL_COMMUNITY)
Admission: EM | Admit: 2016-03-14 | Discharge: 2016-03-14 | Disposition: A | Discharge status: Home / Self Care | Payer: Medicare HMO | Attending: Emergency Medicine | Admitting: Emergency Medicine

## 2016-03-14 ENCOUNTER — Encounter (HOSPITAL_COMMUNITY): Payer: Self-pay | Admitting: Emergency Medicine

## 2016-03-14 DIAGNOSIS — Z4800 Encounter for change or removal of nonsurgical wound dressing: Secondary | ICD-10-CM | POA: Diagnosis present

## 2016-03-14 DIAGNOSIS — F909 Attention-deficit hyperactivity disorder, unspecified type: Secondary | ICD-10-CM | POA: Diagnosis not present

## 2016-03-14 DIAGNOSIS — F17210 Nicotine dependence, cigarettes, uncomplicated: Secondary | ICD-10-CM | POA: Diagnosis not present

## 2016-03-14 DIAGNOSIS — I10 Essential (primary) hypertension: Secondary | ICD-10-CM | POA: Insufficient documentation

## 2016-03-14 DIAGNOSIS — Z96612 Presence of left artificial shoulder joint: Secondary | ICD-10-CM | POA: Diagnosis not present

## 2016-03-14 DIAGNOSIS — Z96651 Presence of right artificial knee joint: Secondary | ICD-10-CM | POA: Diagnosis not present

## 2016-03-14 DIAGNOSIS — J449 Chronic obstructive pulmonary disease, unspecified: Secondary | ICD-10-CM | POA: Diagnosis not present

## 2016-03-14 DIAGNOSIS — Z853 Personal history of malignant neoplasm of breast: Secondary | ICD-10-CM | POA: Insufficient documentation

## 2016-03-14 DIAGNOSIS — L03116 Cellulitis of left lower limb: Secondary | ICD-10-CM

## 2016-03-14 NOTE — ED Triage Notes (Signed)
Per pt, states she is here for a 24 hour wound check

## 2016-03-14 NOTE — ED Provider Notes (Signed)
[facility] DEPT Provider Note   CSN: [id number] Arrival date & time: 03/14/16  1315     History   Chief Complaint Chief Complaint  Patient presents with  . Wound Check    HPI Anita Arroyo is a 56 y.o. female.  HPI Pt was seen last night for a cat bite to the left posterior thigh. Started on abx last night. Told to return in 24 hours for repeat evaluation. Pt reports mild spread of the redness outside the lines drawn but has not filled her abx at this time. No fevers or chills   Past Medical History:  Diagnosis Date  . ADHD (attention deficit hyperactivity disorder)   . Arthritis   . Asthma   . Bipolar 1 disorder ([place])   . Bipolar 1 disorder ([place])   . Breast cancer ([place])   . Depression   . Dysrhythmia    palpitations  . Dysrhythmia    atrial fibrillation  . Fibromyalgia   . GERD (gastroesophageal reflux disease)   . Heart murmur   . History of stress test 04/28/2011   showed normal perfusion without scar or ischemia  . Hx of echocardiogram 04/28/2011   showed normal systolic function with mild diastolic dysfunction,she had trace MR but did not have frank mitral valve prolapse demonstrated. She had mild pulmonary hypertension with an estimated RV systolic pressure at 34 mm.  . Hyperlipemia   . Meniere disease   . MVP (mitral valve prolapse)   . Neuromuscular disorder [facility])    fibromyalgia    Patient Active Problem List   Diagnosis Date Noted  . History of palpitations 09/16/2015  . Pre-operative cardiovascular examination 09/16/2015  . Bipolar 1 disorder ([place]) 09/16/2015  . Palpitations 08/28/2014  . Hyperlipidemia LDL goal <70 11/26/2013  . History of breast cancer 11/16/2012  . Fibromyalgia 11/16/2012  . History of tobacco abuse 11/16/2012  . MRSA 04/13/2010  . INTERTRIGO 04/13/2010  . GROUP B STREPTOCOCCUS INFECTION, HX OF 04/13/2010  . HYPERLIPIDEMIA 09/02/2006  . DEPRESSION 09/02/2006  . Essential hypertension 09/02/2006  . COPD 09/02/2006  .  GERD 09/02/2006    Past Surgical History:  Procedure Laterality Date  . ANKLE SURGERY     left x4  . BREAST SURGERY  2011   lumpectomy right breast   . COLONOSCOPY WITH PROPOFOL N/A 02/25/2014   Procedure: COLONOSCOPY WITH PROPOFOL;  Surgeon: Garlan Fair, MD;  Location: [facility];  Service: Endoscopy;  Laterality: N/A;  . deviated septum    . ECTOPIC PREGNANCY SURGERY    . ELBOW SURGERY     right elbow  . EYE SURGERY     cataract surgery  . fibroid     2-3 fibroid adenomas removed  . JOINT REPLACEMENT  2011   right knee   . KNEE SURGERY     left knee  . SHOULDER ARTHROSCOPY WITH BICEPSTENOTOMY Right 09/25/2015   Procedure: SHOULDER ARTHROSCOPY WITH BICEPSTENOTOMY;  Surgeon: Ninetta Lights, MD;  Location: [place];  Service: Orthopedics;  Laterality: Right;  . SHOULDER ARTHROSCOPY WITH DISTAL CLAVICLE RESECTION Right 09/25/2015   Procedure: SHOULDER ARTHROSCOPY WITH DISTAL CLAVICLE RESECTION;  Surgeon: Ninetta Lights, MD;  Location: [place];  Service: Orthopedics;  Laterality: Right;  . SHOULDER ARTHROSCOPY WITH SUBACROMIAL DECOMPRESSION Right 09/25/2015   Procedure: RIGHT SHOULDER ARTHROSCOPY DEBRIDEMENT,ACROMIOPLASTY,DISTAL CLAVICAL EXCISION, RELEASE OF BICEPS TENDON;  Surgeon: Ninetta Lights, MD;  Location: [place];  Service: Orthopedics;  Laterality: Right;  . TMJ  ARTHROPLASTY    . TONSILLECTOMY    . TOTAL SHOULDER ARTHROPLASTY Left 01/05/2016  . UTERINE FIBROID SURGERY     polups and fibroids removed   . WRIST SURGERY     left    OB History    No data available       Home Medications    Prior to Admission medications   Medication Sig Start Date End Date Taking? Authorizing Provider  atenolol (TENORMIN) 50 MG tablet Take 50 mg by mouth 2 (two) times daily.    Historical Provider, MD  Biotin (BIOTIN 5000) 5 MG CAPS Take 5 mg by mouth daily.    Historical Provider, MD  cetirizine (ZYRTEC) 10 MG tablet Take  10 mg by mouth daily.     Historical Provider, MD  clindamycin (CLEOCIN) 150 MG capsule Take 3 capsules (450 mg total) by mouth 3 (three) times daily. 03/13/16   Nicole Pisciotta, PA-C  doxycycline (VIBRAMYCIN) 100 MG capsule Take 1 capsule (100 mg total) by mouth 2 (two) times daily. 03/13/16   Nicole Pisciotta, PA-C  fluticasone (FLONASE) 50 MCG/ACT nasal spray Place 1 spray into both nostrils 2 (two) times daily.     Historical Provider, MD  Fluticasone-Salmeterol (ADVAIR) 250-50 MCG/DOSE AEPB Inhale 1 puff into the lungs 2 (two) times daily.    Historical Provider, MD  HYDROcodone-acetaminophen (NORCO/VICODIN) 5-325 MG tablet Take 1-2 tablets by mouth every 6 hours as needed for pain and/or cough. 03/13/16   Nicole Pisciotta, PA-C  lamoTRIgine (LAMICTAL) 150 MG tablet Take 1 tablet (150 mg total) by mouth 2 (two) times daily. 02/19/16   Kathlee Nations, MD  Magnesium 250 MG TABS Take 250 mg by mouth daily.    Historical Provider, MD  oxyCODONE-acetaminophen (PERCOCET/ROXICET) 5-325 MG tablet Take 1 tablet by mouth every 4 (four) hours as needed for severe pain.     Historical Provider, MD  pantoprazole (PROTONIX) 40 MG tablet Take 40 mg by mouth daily.     Historical Provider, MD  traZODone (DESYREL) 100 MG tablet Take 2 tablets (200 mg total) by mouth at bedtime. 02/19/16   Kathlee Nations, MD  triamterene-hydrochlorothiazide (MAXZIDE-25) 37.5-25 MG tablet Take 1 tablet by mouth daily.    Historical Provider, MD  venlafaxine XR (EFFEXOR-XR) 150 MG 24 hr capsule Take 150 mg by mouth at bedtime.    Historical Provider, MD    Family History Family History  Problem Relation Age of Onset  . Mitral valve prolapse Mother   . Heart Problems Mother   . Cancer Mother     Social History Social History  Substance Use Topics  . Smoking status: Current Every Day Smoker    Packs/day: 1.50    Years: 40.00    Types: Cigarettes  . Smokeless tobacco: Never Used     Comment: Cannot afford chantix. Working on  getting discount from drug compny  . Alcohol use 0.0 oz/week     Comment: occ     Allergies   Contrast media [iodinated diagnostic agents]; Iodine; Adhesive [tape]; Amoxicillin-pot clavulanate; Ceclor [cefaclor]; Moxifloxacin; and Sulfonamide derivatives   Review of Systems Review of Systems  All other systems reviewed and are negative.    Physical Exam Updated Vital Signs BP 112/55   Pulse 79   Temp 98.3 F (36.8 C) (Oral)   Resp 18   LMP 06/25/2011   SpO2 97%   Physical Exam  Constitutional: She is oriented to person, place, and time. She appears well-developed and well-nourished.  HENT:  Head: Normocephalic.  Eyes: EOM are normal.  Neck: Normal range of motion.  Pulmonary/Chest: Effort normal.  Abdominal: She exhibits no distension.  Musculoskeletal:  Erythema of the posterior left thigh with small amount of erythema extending beyond the outline from last night. No flucutuance or drainage at this time  Neurological: She is alert and oriented to person, place, and time.  Psychiatric: She has a normal mood and affect.  Nursing note and vitals reviewed.    ED Treatments / Results  Labs (all labs ordered are listed, but only abnormal results are displayed) Labs Reviewed - No data to display  EKG  EKG Interpretation None       Radiology No results found.  Procedures Procedures (including critical care time)  Medications Ordered in ED Medications - No data to display   Initial Impression / Assessment and Plan / ED Course  I have reviewed the triage vital signs and the nursing notes.  Pertinent labs & imaging results that were available during my care of the patient were reviewed by me and considered in my medical decision making (see chart for details).     Pt has not filled her abx. She is going to fill the abx now. She will return in 24 hours for a recheck.  I stressed the importance of filling the abx. She assures me she will fill these  now  Final Clinical Impressions(s) / ED Diagnoses   Final diagnoses:  Cellulitis of left lower extremity    New Prescriptions New Prescriptions   No medications on file     Jola Schmidt, MD 03/14/16 1331

## 2016-03-14 NOTE — Discharge Instructions (Signed)
Repeat evaluation in the ER tomorrow or sooner as needed for worsening symptoms

## 2016-03-14 NOTE — ED Notes (Signed)
Swelling is slightly worse than yesterday.

## 2016-03-15 ENCOUNTER — Telehealth: Payer: Self-pay | Admitting: Physical Therapy

## 2016-03-15 ENCOUNTER — Encounter: Payer: Self-pay | Admitting: Physical Therapy

## 2016-03-15 ENCOUNTER — Encounter (HOSPITAL_COMMUNITY): Payer: Self-pay

## 2016-03-15 ENCOUNTER — Emergency Department (HOSPITAL_COMMUNITY)
Admission: EM | Admit: 2016-03-15 | Discharge: 2016-03-15 | Disposition: A | Discharge status: Home / Self Care | Payer: Medicare HMO | Attending: Emergency Medicine | Admitting: Emergency Medicine

## 2016-03-15 DIAGNOSIS — Z853 Personal history of malignant neoplasm of breast: Secondary | ICD-10-CM | POA: Insufficient documentation

## 2016-03-15 DIAGNOSIS — Z4800 Encounter for change or removal of nonsurgical wound dressing: Secondary | ICD-10-CM | POA: Diagnosis not present

## 2016-03-15 DIAGNOSIS — Z79899 Other long term (current) drug therapy: Secondary | ICD-10-CM | POA: Diagnosis not present

## 2016-03-15 DIAGNOSIS — I10 Essential (primary) hypertension: Secondary | ICD-10-CM | POA: Insufficient documentation

## 2016-03-15 DIAGNOSIS — J449 Chronic obstructive pulmonary disease, unspecified: Secondary | ICD-10-CM | POA: Diagnosis not present

## 2016-03-15 DIAGNOSIS — F17210 Nicotine dependence, cigarettes, uncomplicated: Secondary | ICD-10-CM | POA: Insufficient documentation

## 2016-03-15 DIAGNOSIS — Z96612 Presence of left artificial shoulder joint: Secondary | ICD-10-CM | POA: Diagnosis not present

## 2016-03-15 DIAGNOSIS — Z96651 Presence of right artificial knee joint: Secondary | ICD-10-CM | POA: Diagnosis not present

## 2016-03-15 DIAGNOSIS — F909 Attention-deficit hyperactivity disorder, unspecified type: Secondary | ICD-10-CM | POA: Insufficient documentation

## 2016-03-15 DIAGNOSIS — Z5189 Encounter for other specified aftercare: Secondary | ICD-10-CM

## 2016-03-15 NOTE — ED Provider Notes (Signed)
[facility] DEPT Provider Note   CSN: [id number] Arrival date & time: 03/15/16  1254  By signing my name below, I, Sonum Patel, attest that this documentation has been prepared under the direction and in the presence of [employer], PA-C. Electronically Signed: Sonum Patel, Education administrator. 03/15/16. 3:35 PM.  History   Chief Complaint Chief Complaint  Patient presents with  . Wound Check    The history is provided by the patient. No language interpreter was used.     HPI Comments:    Anita Arroyo is a 56 y.o. female who presents to the Emergency Department for wound recheck.  Patient was seen on 03/13/2016 after being bit by a cat 4 days prior. She was seen in the emergency room and started on prophylactic rabies vaccinations. Patient was discharged on clindamycin and doxycycline. Patient did not start taking antibiotics at that time, first dose of antibiotics yesterday. Patient reports that since her visit yesterday symptoms have been improving as the redness initially spread outside of the marked lines but has been receiving since then. Patient has not had any fevers and last 24 hours.   Patient notes that since being seen yesterday symptoms have improved.     Past Medical History:  Diagnosis Date  . ADHD (attention deficit hyperactivity disorder)   . Arthritis   . Asthma   . Bipolar 1 disorder ([place])   . Bipolar 1 disorder ([place])   . Breast cancer ([place])   . Depression   . Dysrhythmia    palpitations  . Dysrhythmia    atrial fibrillation  . Fibromyalgia   . GERD (gastroesophageal reflux disease)   . Heart murmur   . History of stress test 04/28/2011   showed normal perfusion without scar or ischemia  . Hx of echocardiogram 04/28/2011   showed normal systolic function with mild diastolic dysfunction,she had trace MR but did not have frank mitral valve prolapse demonstrated. She had mild pulmonary hypertension with an estimated RV systolic pressure at 34 mm.  . Hyperlipemia     . Meniere disease   . MVP (mitral valve prolapse)   . Neuromuscular disorder [facility])    fibromyalgia    Patient Active Problem List   Diagnosis Date Noted  . History of palpitations 09/16/2015  . Pre-operative cardiovascular examination 09/16/2015  . Bipolar 1 disorder ([place]) 09/16/2015  . Palpitations 08/28/2014  . Hyperlipidemia LDL goal <70 11/26/2013  . History of breast cancer 11/16/2012  . Fibromyalgia 11/16/2012  . History of tobacco abuse 11/16/2012  . MRSA 04/13/2010  . INTERTRIGO 04/13/2010  . GROUP B STREPTOCOCCUS INFECTION, HX OF 04/13/2010  . HYPERLIPIDEMIA 09/02/2006  . DEPRESSION 09/02/2006  . Essential hypertension 09/02/2006  . COPD 09/02/2006  . GERD 09/02/2006    Past Surgical History:  Procedure Laterality Date  . ANKLE SURGERY     left x4  . BREAST SURGERY  2011   lumpectomy right breast   . COLONOSCOPY WITH PROPOFOL N/A 02/25/2014   Procedure: COLONOSCOPY WITH PROPOFOL;  Surgeon: Garlan Fair, MD;  [facility];  Service: Endoscopy;  Laterality: N/A;  . deviated septum    . ECTOPIC PREGNANCY SURGERY    . ELBOW SURGERY     right elbow  . EYE SURGERY     cataract surgery  . fibroid     2-3 fibroid adenomas removed  . JOINT REPLACEMENT  2011   right knee   . KNEE SURGERY     left knee  . SHOULDER ARTHROSCOPY WITH  BICEPSTENOTOMY Right 09/25/2015   Procedure: SHOULDER ARTHROSCOPY WITH BICEPSTENOTOMY;  Surgeon: Ninetta Lights, MD;  Location: [place];  Service: Orthopedics;  Laterality: Right;  . SHOULDER ARTHROSCOPY WITH DISTAL CLAVICLE RESECTION Right 09/25/2015   Procedure: SHOULDER ARTHROSCOPY WITH DISTAL CLAVICLE RESECTION;  Surgeon: Ninetta Lights, MD;  Location: [place];  Service: Orthopedics;  Laterality: Right;  . SHOULDER ARTHROSCOPY WITH SUBACROMIAL DECOMPRESSION Right 09/25/2015   Procedure: RIGHT SHOULDER ARTHROSCOPY DEBRIDEMENT,ACROMIOPLASTY,DISTAL CLAVICAL EXCISION, RELEASE OF BICEPS TENDON;   Surgeon: Ninetta Lights, MD;  Location: [place];  Service: Orthopedics;  Laterality: Right;  . TMJ ARTHROPLASTY    . TONSILLECTOMY    . TOTAL SHOULDER ARTHROPLASTY Left 01/05/2016  . UTERINE FIBROID SURGERY     polups and fibroids removed   . WRIST SURGERY     left    OB History    No data available       Home Medications    Prior to Admission medications   Medication Sig Start Date End Date Taking? Authorizing Provider  atenolol (TENORMIN) 50 MG tablet Take 50 mg by mouth 2 (two) times daily.    Historical Provider, MD  Biotin (BIOTIN 5000) 5 MG CAPS Take 5 mg by mouth daily.    Historical Provider, MD  cetirizine (ZYRTEC) 10 MG tablet Take 10 mg by mouth daily.     Historical Provider, MD  clindamycin (CLEOCIN) 150 MG capsule Take 3 capsules (450 mg total) by mouth 3 (three) times daily. 03/13/16   Nicole Pisciotta, PA-C  doxycycline (VIBRAMYCIN) 100 MG capsule Take 1 capsule (100 mg total) by mouth 2 (two) times daily. 03/13/16   Nicole Pisciotta, PA-C  fluticasone (FLONASE) 50 MCG/ACT nasal spray Place 1 spray into both nostrils 2 (two) times daily.     Historical Provider, MD  Fluticasone-Salmeterol (ADVAIR) 250-50 MCG/DOSE AEPB Inhale 1 puff into the lungs 2 (two) times daily.    Historical Provider, MD  HYDROcodone-acetaminophen (NORCO/VICODIN) 5-325 MG tablet Take 1-2 tablets by mouth every 6 hours as needed for pain and/or cough. 03/13/16   Nicole Pisciotta, PA-C  lamoTRIgine (LAMICTAL) 150 MG tablet Take 1 tablet (150 mg total) by mouth 2 (two) times daily. 02/19/16   Kathlee Nations, MD  Magnesium 250 MG TABS Take 250 mg by mouth daily.    Historical Provider, MD  oxyCODONE-acetaminophen (PERCOCET/ROXICET) 5-325 MG tablet Take 1 tablet by mouth every 4 (four) hours as needed for severe pain.     Historical Provider, MD  pantoprazole (PROTONIX) 40 MG tablet Take 40 mg by mouth daily.     Historical Provider, MD  traZODone (DESYREL) 100 MG tablet Take 2 tablets  (200 mg total) by mouth at bedtime. 02/19/16   Kathlee Nations, MD  triamterene-hydrochlorothiazide (MAXZIDE-25) 37.5-25 MG tablet Take 1 tablet by mouth daily.    Historical Provider, MD  venlafaxine XR (EFFEXOR-XR) 150 MG 24 hr capsule Take 150 mg by mouth at bedtime.    Historical Provider, MD    Family History Family History  Problem Relation Age of Onset  . Mitral valve prolapse Mother   . Heart Problems Mother   . Cancer Mother     Social History Social History  Substance Use Topics  . Smoking status: Current Every Day Smoker    Packs/day: 1.50    Years: 40.00    Types: Cigarettes  . Smokeless tobacco: Never Used     Comment: Cannot afford chantix. Working on getting discount from drug  compny  . Alcohol use 0.0 oz/week     Comment: occ     Allergies   Contrast media [iodinated diagnostic agents]; Iodine; Adhesive [tape]; Amoxicillin-pot clavulanate; Ceclor [cefaclor]; Moxifloxacin; and Sulfonamide derivatives   Review of Systems Review of Systems  All other systems reviewed and are negative.   Physical Exam Updated Vital Signs BP 106/57 (BP Location: Left Arm)   Pulse 67   Temp 98.7 F (37.1 C) (Oral)   Resp 16   Ht 5' 2.5" (1.588 m)   Wt 78.5 kg   LMP 06/25/2011   SpO2 95%   BMI 31.14 kg/m   Physical Exam  Constitutional: She is oriented to person, place, and time. She appears well-developed and well-nourished.  HENT:  Head: Normocephalic and atraumatic.  Cardiovascular: Normal rate.   Pulmonary/Chest: Effort normal.  Musculoskeletal:  Erythema and induration noted to the left posterior leg. No fluctuance noted, ultrasound shows no drainable abscess- erythema improved from previous visit- no discharge  Neurological: She is alert and oriented to person, place, and time.  Skin: Skin is warm and dry.  Psychiatric: She has a normal mood and affect.  Nursing note and vitals reviewed.    ED Treatments / Results  DIAGNOSTIC STUDIES: Oxygen Saturation is  95% on RA, adequate by my interpretation.    COORDINATION OF CARE: 3:35 PM Discussed treatment plan with pt at bedside and pt agreed to plan.   Labs (all labs ordered are listed, but only abnormal results are displayed) Labs Reviewed - No data to display  EKG  EKG Interpretation None       Radiology No results found.  Procedures Procedures (including critical care time)  Medications Ordered in ED Medications - No data to display   Initial Impression / Assessment and Plan / ED Course  I have reviewed the triage vital signs and the nursing notes.  Pertinent labs & imaging results that were available during my care of the patient were reviewed by me and considered in my medical decision making (see chart for details).     Final Clinical Impressions(s) / ED Diagnoses   Final diagnoses:  Visit for wound check    Labs:   Imaging:  Consults:  Therapeutics:  Discharge Meds:   Assessment/Plan: 56 year old female presents today for wound check. Patient is very upset upon my initial evaluation as she does not understand why she had waited to be seen. She reports she "should be over the walker) and be seen". Patient has infection of her posterior leg, this seems improved based off of previous provider's notes and patient's account. She has no signs of significant systemic infection. Bedside ultrasound showed no obvious drainable abscess. Patient has been on antibiotics for 1 day, she is encouraged to continue taking antibiotics, follow-up with urgent care for continued vaccination. Patient will have evaluation tomorrow, lying informed her that she will likely need close follow-up every 1-2 days until wound improves. Patient verbalized her understanding and agreement to today's plan had no further questions or concerns. At the time of discharge patient was again upset that she had wait for her discharge paperwork.    New Prescriptions New Prescriptions   No medications on  file   I personally performed the services described in this documentation, which was scribed in my presence. The recorded information has been reviewed and is accurate.   Okey Regal, PA-C 03/15/16 [place], MD 03/16/16 [phone]

## 2016-03-15 NOTE — ED Notes (Signed)
Patient was alert, oriented and stable upon discharge. RN went over AVS and patient had no further questions.  

## 2016-03-15 NOTE — ED Notes (Signed)
EDP in room to [place] pt wound due to increased pain in it

## 2016-03-15 NOTE — ED Triage Notes (Addendum)
PT HERE FOR A WOUND CHECK FOR AN INFECTED CAT BITE TO THE BACK OF THE LEFT LEG. PT STILL TAKING HER PRESCRIBED ABX. PT STS SHE HAS MORE PAIN IN THE LEG TODAY THAN YESTERDAY. SWELLING AND REDNESS HAS NOT MOVED OUTSIDE OF THE MARGINS.

## 2016-03-15 NOTE — Discharge Instructions (Signed)
Please continue using antibiotics. Please follow-up at urgent care for rabies vaccinations and wound assessment. Please return the emergency room if he have any concerning signs or symptoms.

## 2016-03-16 ENCOUNTER — Encounter (HOSPITAL_COMMUNITY): Payer: Self-pay | Admitting: Emergency Medicine

## 2016-03-16 ENCOUNTER — Ambulatory Visit (HOSPITAL_COMMUNITY)
Admission: EM | Admit: 2016-03-16 | Discharge: 2016-03-16 | Disposition: A | Discharge status: Home / Self Care | Payer: Medicare HMO | Attending: Family Medicine | Admitting: Family Medicine

## 2016-03-16 DIAGNOSIS — W5501XA Bitten by cat, initial encounter: Secondary | ICD-10-CM | POA: Diagnosis not present

## 2016-03-16 DIAGNOSIS — Z203 Contact with and (suspected) exposure to rabies: Secondary | ICD-10-CM

## 2016-03-16 DIAGNOSIS — Z2914 Encounter for prophylactic rabies immune globin: Secondary | ICD-10-CM

## 2016-03-16 DIAGNOSIS — L03116 Cellulitis of left lower limb: Secondary | ICD-10-CM

## 2016-03-16 DIAGNOSIS — Z5189 Encounter for other specified aftercare: Secondary | ICD-10-CM

## 2016-03-16 MED ORDER — RABIES VACCINE, PCEC IM SUSR
1.0000 mL | Freq: Once | INTRAMUSCULAR | Status: AC
  Administered 2016-03-16: 1 mL via INTRAMUSCULAR

## 2016-03-16 MED ORDER — RABIES VACCINE, PCEC IM SUSR
INTRAMUSCULAR | Status: AC
  Filled 2016-03-16: qty 1

## 2016-03-16 NOTE — ED Triage Notes (Signed)
The patient presented to the [facility] to receive a Day 3 vaccination for rabies.   The patient is also here to have the initial cat bit wound checked.

## 2016-03-16 NOTE — ED Provider Notes (Signed)
CSN: [id number]     Arrival date & time 03/16/16  1135 History   First MD Initiated Contact with Patient 03/16/16 1340     Chief Complaint  Patient presents with  . Rabies Injection  . Wound Check   (Consider location/radiation/quality/duration/timing/severity/associated sxs/prior Treatment) 56 year old female with a history of cat bite approximately 4 days ago was seen in the emergency department and rabies prophylaxis was started. She return to the emergency department 2 consecutive days for wound check. She states chest today she had an ultrasound of the leg. There are skin markings which indicate a decrease in the size of the erythema. The line that was drawn yesterday shows that the erythema is of the same size. No erythema outside the line. The entire area of erythema is exquisitely tender. The patient has a hard time walking due to pain. She has a difficult time flexing due to pain. She was placed on doxycycline IV while in the ED the first day and clindamycin orally. She started that medication 48 hours ago. She requested that the wound be checked today.      Past Medical History:  Diagnosis Date  . ADHD (attention deficit hyperactivity disorder)   . Arthritis   . Asthma   . Bipolar 1 disorder ([place])   . Bipolar 1 disorder ([place])   . Breast cancer ([place])   . Depression   . Dysrhythmia    palpitations  . Dysrhythmia    atrial fibrillation  . Fibromyalgia   . GERD (gastroesophageal reflux disease)   . Heart murmur   . History of stress test 04/28/2011   showed normal perfusion without scar or ischemia  . Hx of echocardiogram 04/28/2011   showed normal systolic function with mild diastolic dysfunction,she had trace MR but did not have frank mitral valve prolapse demonstrated. She had mild pulmonary hypertension with an estimated RV systolic pressure at 34 mm.  . Hyperlipemia   . Meniere disease   . MVP (mitral valve prolapse)   . Neuromuscular disorder ([place])    fibromyalgia     Past Surgical History:  Procedure Laterality Date  . ANKLE SURGERY     left x4  . BREAST SURGERY  2011   lumpectomy right breast   . COLONOSCOPY WITH PROPOFOL N/A 02/25/2014   Procedure: COLONOSCOPY WITH PROPOFOL;  Surgeon: Garlan Fair, MD;  Location: [facility];  Service: Endoscopy;  Laterality: N/A;  . deviated septum    . ECTOPIC PREGNANCY SURGERY    . ELBOW SURGERY     right elbow  . EYE SURGERY     cataract surgery  . fibroid     2-3 fibroid adenomas removed  . JOINT REPLACEMENT  2011   right knee   . KNEE SURGERY     left knee  . SHOULDER ARTHROSCOPY WITH BICEPSTENOTOMY Right 09/25/2015   Procedure: SHOULDER ARTHROSCOPY WITH BICEPSTENOTOMY;  Surgeon: Ninetta Lights, MD;  Location: [place];  Service: Orthopedics;  Laterality: Right;  . SHOULDER ARTHROSCOPY WITH DISTAL CLAVICLE RESECTION Right 09/25/2015   Procedure: SHOULDER ARTHROSCOPY WITH DISTAL CLAVICLE RESECTION;  Surgeon: Ninetta Lights, MD;  Location: [place];  Service: Orthopedics;  Laterality: Right;  . SHOULDER ARTHROSCOPY WITH SUBACROMIAL DECOMPRESSION Right 09/25/2015   Procedure: RIGHT SHOULDER ARTHROSCOPY DEBRIDEMENT,ACROMIOPLASTY,DISTAL CLAVICAL EXCISION, RELEASE OF BICEPS TENDON;  Surgeon: Ninetta Lights, MD;  Location: [place];  Service: Orthopedics;  Laterality: Right;  . TMJ ARTHROPLASTY    . TONSILLECTOMY    .  TOTAL SHOULDER ARTHROPLASTY Left 01/05/2016  . UTERINE FIBROID SURGERY     polups and fibroids removed   . WRIST SURGERY     left   Family History  Problem Relation Age of Onset  . Mitral valve prolapse Mother   . Heart Problems Mother   . Cancer Mother    Social History  Substance Use Topics  . Smoking status: Current Every Day Smoker    Packs/day: 1.50    Years: 40.00    Types: Cigarettes  . Smokeless tobacco: Never Used     Comment: Cannot afford chantix. Working on getting discount from drug compny  . Alcohol use 0.0  oz/week     Comment: occ   OB History    No data available     Review of Systems  Constitutional: Negative.   Respiratory: Negative.   Gastrointestinal: Negative.   Skin: Positive for color change and wound.  Psychiatric/Behavioral: The patient is nervous/anxious.   All other systems reviewed and are negative.   Allergies  Contrast media [iodinated diagnostic agents]; Iodine; Adhesive [tape]; Amoxicillin-pot clavulanate; Ceclor [cefaclor]; Moxifloxacin; and Sulfonamide derivatives  Home Medications   Prior to Admission medications   Medication Sig Start Date End Date Taking? Authorizing Provider  atenolol (TENORMIN) 50 MG tablet Take 50 mg by mouth 2 (two) times daily.    Historical Provider, MD  Biotin (BIOTIN 5000) 5 MG CAPS Take 5 mg by mouth daily.    Historical Provider, MD  cetirizine (ZYRTEC) 10 MG tablet Take 10 mg by mouth daily.     Historical Provider, MD  clindamycin (CLEOCIN) 150 MG capsule Take 3 capsules (450 mg total) by mouth 3 (three) times daily. 03/13/16   Nicole Pisciotta, PA-C  doxycycline (VIBRAMYCIN) 100 MG capsule Take 1 capsule (100 mg total) by mouth 2 (two) times daily. 03/13/16   Nicole Pisciotta, PA-C  fluticasone (FLONASE) 50 MCG/ACT nasal spray Place 1 spray into both nostrils 2 (two) times daily.     Historical Provider, MD  Fluticasone-Salmeterol (ADVAIR) 250-50 MCG/DOSE AEPB Inhale 1 puff into the lungs 2 (two) times daily.    Historical Provider, MD  HYDROcodone-acetaminophen (NORCO/VICODIN) 5-325 MG tablet Take 1-2 tablets by mouth every 6 hours as needed for pain and/or cough. 03/13/16   Nicole Pisciotta, PA-C  lamoTRIgine (LAMICTAL) 150 MG tablet Take 1 tablet (150 mg total) by mouth 2 (two) times daily. 02/19/16   Kathlee Nations, MD  Magnesium 250 MG TABS Take 250 mg by mouth daily.    Historical Provider, MD  oxyCODONE-acetaminophen (PERCOCET/ROXICET) 5-325 MG tablet Take 1 tablet by mouth every 4 (four) hours as needed for severe pain.      Historical Provider, MD  pantoprazole (PROTONIX) 40 MG tablet Take 40 mg by mouth daily.     Historical Provider, MD  traZODone (DESYREL) 100 MG tablet Take 2 tablets (200 mg total) by mouth at bedtime. 02/19/16   Kathlee Nations, MD  triamterene-hydrochlorothiazide (MAXZIDE-25) 37.5-25 MG tablet Take 1 tablet by mouth daily.    Historical Provider, MD  venlafaxine XR (EFFEXOR-XR) 150 MG 24 hr capsule Take 150 mg by mouth at bedtime.    Historical Provider, MD   Meds Ordered and Administered this Visit   Medications  rabies vaccine (RABAVERT) injection 1 mL (not administered)    BP 106/62 (BP Location: Left Arm)   Pulse 71   Temp 98.8 F (37.1 C) (Oral)   Resp 18   LMP 06/25/2011   SpO2 98%  No data  found.   Physical Exam  Constitutional: She appears well-developed and well-nourished. No distress.  Cardiovascular: Normal rate.   Pulmonary/Chest: Effort normal.  Neurological: She is alert.  Skin: Skin is warm and dry.  In reference to the site of scratch marks in the left posterior thigh the area of erythema has not increased. It is a little indurated and very tender. No apparent fluctuant although the patient will not allow additional palpation. There is no drainage, no purulence no vesicles no open areas of the skin. No  edema is seen around the wound or in the thigh or knee or calf.  Nursing note and vitals reviewed.   Urgent Care Course      1400h  03/16/16. Urgent Care.  Procedures (including critical care time)  Labs Review Labs Reviewed - No data to display  Imaging Review No results found.   Visual Acuity Review  Right Eye Distance:   Left Eye Distance:   Bilateral Distance:    Right Eye Near:   Left Eye Near:    Bilateral Near:         MDM   1. Contact with or exposure to rabies    Continue taking your antibiotics. You may return in 2 days for recheck unless there is dramatic improvement and you prefer to wait 4 days to return to receive your rabies  vaccination. For fever, chills, increase in size of the erythema or formation of swelling of the red area then he may need to go to the emergency department. If there is any suspicion of abscess formation he should go to the emergency department. Meds ordered this encounter  Medications  . rabies vaccine (RABAVERT) injection 1 mL       Janne Napoleon, NP 03/16/16 1421

## 2016-03-16 NOTE — ED Notes (Signed)
Anita Napoleon, NP notified this patient needs provider assessment due to wound concerns

## 2016-03-16 NOTE — Discharge Instructions (Signed)
Continue taking your antibiotics. You may return in 2 days for recheck unless there is dramatic improvement and you prefer to wait 4 days to return to receive your rabies vaccination. For fever, chills, increase in size of the erythema or formation of swelling of the red area then he may need to go to the emergency department. If there is any suspicion of abscess formation he should go to the emergency department.

## 2016-03-17 ENCOUNTER — Encounter: Payer: Self-pay | Admitting: Physical Therapy

## 2016-03-17 ENCOUNTER — Ambulatory Visit: Payer: Commercial Managed Care - HMO | Admitting: Physical Therapy

## 2016-03-17 DIAGNOSIS — M25612 Stiffness of left shoulder, not elsewhere classified: Secondary | ICD-10-CM | POA: Diagnosis not present

## 2016-03-17 DIAGNOSIS — M25512 Pain in left shoulder: Secondary | ICD-10-CM

## 2016-03-17 DIAGNOSIS — M25611 Stiffness of right shoulder, not elsewhere classified: Secondary | ICD-10-CM

## 2016-03-17 DIAGNOSIS — M25511 Pain in right shoulder: Secondary | ICD-10-CM

## 2016-03-17 DIAGNOSIS — M6281 Muscle weakness (generalized): Secondary | ICD-10-CM | POA: Diagnosis not present

## 2016-03-17 NOTE — Therapy (Signed)
[facility] [address] [place], [place], [address] Phone: [phone]   Fax:  [phone]  Physical Therapy Treatment  Patient Details  Name: Anita Arroyo MRN: [id number] Date of Birth: 23-Sep-1960 Referring Provider: Kathryne Hitch, MD  Encounter Date: 03/17/2016      PT End of Session - 03/17/16 1237    Visit Number 16   Number of Visits 20   Date for PT Re-Evaluation 04/19/16   PT Start Time 1236   PT Stop Time 1325   PT Time Calculation (min) 49 min   Activity Tolerance Patient tolerated treatment well   Behavior During Therapy [facility] for tasks assessed/performed      Past Medical History:  Diagnosis Date  . ADHD (attention deficit hyperactivity disorder)   . Arthritis   . Asthma   . Bipolar 1 disorder ([place])   . Bipolar 1 disorder ([place])   . Breast cancer ([place])   . Depression   . Dysrhythmia    palpitations  . Dysrhythmia    atrial fibrillation  . Fibromyalgia   . GERD (gastroesophageal reflux disease)   . Heart murmur   . History of stress test 04/28/2011   showed normal perfusion without scar or ischemia  . Hx of echocardiogram 04/28/2011   showed normal systolic function with mild diastolic dysfunction,she had trace MR but did not have frank mitral valve prolapse demonstrated. She had mild pulmonary hypertension with an estimated RV systolic pressure at 34 mm.  . Hyperlipemia   . Meniere disease   . MVP (mitral valve prolapse)   . Neuromuscular disorder ([place])    fibromyalgia    Past Surgical History:  Procedure Laterality Date  . ANKLE SURGERY     left x4  . BREAST SURGERY  2011   lumpectomy right breast   . COLONOSCOPY WITH PROPOFOL N/A 02/25/2014   Procedure: COLONOSCOPY WITH PROPOFOL;  Surgeon: Garlan Fair, MD;  Location: [facility];  Service: Endoscopy;  Laterality: N/A;  . deviated septum    . ECTOPIC PREGNANCY SURGERY    . ELBOW SURGERY     right elbow  . EYE SURGERY      cataract surgery  . fibroid     2-3 fibroid adenomas removed  . JOINT REPLACEMENT  2011   right knee   . KNEE SURGERY     left knee  . SHOULDER ARTHROSCOPY WITH BICEPSTENOTOMY Right 09/25/2015   Procedure: SHOULDER ARTHROSCOPY WITH BICEPSTENOTOMY;  Surgeon: Ninetta Lights, MD;  Location: [place];  Service: Orthopedics;  Laterality: Right;  . SHOULDER ARTHROSCOPY WITH DISTAL CLAVICLE RESECTION Right 09/25/2015   Procedure: SHOULDER ARTHROSCOPY WITH DISTAL CLAVICLE RESECTION;  Surgeon: Ninetta Lights, MD;  Location: [place];  Service: Orthopedics;  Laterality: Right;  . SHOULDER ARTHROSCOPY WITH SUBACROMIAL DECOMPRESSION Right 09/25/2015   Procedure: RIGHT SHOULDER ARTHROSCOPY DEBRIDEMENT,ACROMIOPLASTY,DISTAL CLAVICAL EXCISION, RELEASE OF BICEPS TENDON;  Surgeon: Ninetta Lights, MD;  Location: [place];  Service: Orthopedics;  Laterality: Right;  . TMJ ARTHROPLASTY    . TONSILLECTOMY    . TOTAL SHOULDER ARTHROPLASTY Left 01/05/2016  . UTERINE FIBROID SURGERY     polups and fibroids removed   . WRIST SURGERY     left    There were no vitals filed for this visit.                       [place] Adult PT Treatment/Exercise - 03/17/16 0001  Shoulder Exercises: Seated   Other Seated Exercises Biceps bil 4# 2x10      Shoulder Exercises: Standing   Flexion --  2# flexion on finger ladder 10x   Extension Strengthening;Both;20 reps;Theraband   Theraband Level (Shoulder Extension) Level 2 (Red)   Row Strengthening;Both;20 reps;Theraband   Theraband Level (Shoulder Row) Level 2 (Red)   Other Standing Exercises cone stacking to second shelf 10x      Shoulder Exercises: Pulleys   Flexion 3 minutes   ABduction --  scaption 3 min     Moist Heat Therapy   Number Minutes Moist Heat 10 Minutes     Manual Therapy   Soft tissue mobilization LT shoulder, pectoral, and  upper arm.                  PT Short  Term Goals - 03/08/16 1449      PT SHORT TERM GOAL #1   Title be independent in initial HEP   Time 4   Period Weeks   Status Achieved     PT SHORT TERM GOAL #2   Title demonstrate Rt shoulder AROM flexion to > or = to 120 degrees to improve reacing overhead   Time 4   Period Weeks   Status Achieved     PT SHORT TERM GOAL #3   Title demonstrate Lt shoulder PROM flexion to 90 degrees to improve mobility and allow for use as able   Time 4   Period Weeks   Status Achieved     PT SHORT TERM GOAL #4   Title demonstrate 4+/5 Rt shoulder strength to improve use   Time 4   Period Days   Status On-going           PT Long Term Goals - 01/28/16 1507      PT LONG TERM GOAL #1   Title be independent in advanced HEP   Time 8   Period Weeks   Status On-going     PT LONG TERM GOAL #2   Title reduce FOTO to < or = to 55% limitation   Time 8   Period Weeks   Status On-going     PT LONG TERM GOAL #3   Title demonstrate Lt shoulder P/ROM flexion to > or = to 110 degrees to improve use   Time 8   Period Weeks   Status On-going     PT LONG TERM GOAL #4   Title demonstrate Lt shoulder P/ROM IR/ER to > or = to 45 degrees to improve use   Time 8   Period Days   Status On-going     PT LONG TERM GOAL #5   Title demonstrate Rt shoulder IR to L2 to improve use with self-care   Time 8   Period Weeks   Status On-going     PT LONG TERM GOAL #6   Title report < or = to 4/10 Rt and Lt shoulder pain with daily use   Time 8   Status On-going               Plan - 03/17/16 1237    Clinical Impression Statement Pt was bitten by cat and has a large infected area at her Lt posterior thigh, she reports it has not affected her shoulder. Pt was able to reach to the second shelf with proper shoulder mechanics. Pain was not limiting today expect at her cat bite.    Rehab Potential Good   PT Frequency  2x / week   PT Duration 6 weeks   PT Treatment/Interventions ADLs/Self Care Home  Management;Electrical Stimulation;Cryotherapy;Functional mobility training;Moist Heat;Therapeutic activities;Therapeutic exercise;Neuromuscular re-education;Patient/family education;Passive range of motion;Scar mobilization;Manual techniques;Vasopneumatic Device;Taping   PT Next Visit Plan Rt shoulder A/ROM and strength progression as tolerated, follow post-op protocol for Lt shoulder.   Consulted and Agree with Plan of Care --      Patient will benefit from skilled therapeutic intervention in order to improve the following deficits and impairments:  Decreased range of motion, Pain, Increased muscle spasms, Decreased endurance, Decreased activity tolerance, Postural dysfunction, Increased edema, Decreased strength, Impaired flexibility, Decreased scar mobility, Impaired UE functional use  Visit Diagnosis: Acute pain of left shoulder  Stiffness of left shoulder, not elsewhere classified  Muscle weakness (generalized)  Stiffness of right shoulder, not elsewhere classified  Acute pain of right shoulder     Problem List Patient Active Problem List   Diagnosis Date Noted  . History of palpitations 09/16/2015  . Pre-operative cardiovascular examination 09/16/2015  . Bipolar 1 disorder ([place]) 09/16/2015  . Palpitations 08/28/2014  . Hyperlipidemia LDL goal <70 11/26/2013  . History of breast cancer 11/16/2012  . Fibromyalgia 11/16/2012  . History of tobacco abuse 11/16/2012  . MRSA 04/13/2010  . INTERTRIGO 04/13/2010  . GROUP B STREPTOCOCCUS INFECTION, HX OF 04/13/2010  . HYPERLIPIDEMIA 09/02/2006  . DEPRESSION 09/02/2006  . Essential hypertension 09/02/2006  . COPD 09/02/2006  . GERD 09/02/2006    Myrene Galas PTA  03/17/16 1:14 PM  03/17/2016, 1:14 PM  [facility] [address] [place], [place], [address] Phone: [phone]   Fax:  [phone]  Name: MAGGI GRAPER MRN: [id number] Date of Birth:  25-Apr-1960

## 2016-03-18 LAB — CULTURE, BLOOD (ROUTINE X 2): CULTURE: NO GROWTH

## 2016-03-19 ENCOUNTER — Encounter (HOSPITAL_COMMUNITY): Payer: Self-pay | Admitting: *Deleted

## 2016-03-19 ENCOUNTER — Emergency Department (HOSPITAL_COMMUNITY)
Admission: EM | Admit: 2016-03-19 | Discharge: 2016-03-19 | Disposition: A | Discharge status: Home / Self Care | Payer: Medicare HMO | Attending: Emergency Medicine | Admitting: Emergency Medicine

## 2016-03-19 DIAGNOSIS — J449 Chronic obstructive pulmonary disease, unspecified: Secondary | ICD-10-CM | POA: Diagnosis not present

## 2016-03-19 DIAGNOSIS — I10 Essential (primary) hypertension: Secondary | ICD-10-CM | POA: Insufficient documentation

## 2016-03-19 DIAGNOSIS — L0291 Cutaneous abscess, unspecified: Secondary | ICD-10-CM

## 2016-03-19 DIAGNOSIS — Z96612 Presence of left artificial shoulder joint: Secondary | ICD-10-CM | POA: Diagnosis not present

## 2016-03-19 DIAGNOSIS — L02415 Cutaneous abscess of right lower limb: Secondary | ICD-10-CM | POA: Diagnosis not present

## 2016-03-19 DIAGNOSIS — F909 Attention-deficit hyperactivity disorder, unspecified type: Secondary | ICD-10-CM | POA: Insufficient documentation

## 2016-03-19 DIAGNOSIS — F17210 Nicotine dependence, cigarettes, uncomplicated: Secondary | ICD-10-CM | POA: Diagnosis not present

## 2016-03-19 DIAGNOSIS — Z79899 Other long term (current) drug therapy: Secondary | ICD-10-CM | POA: Insufficient documentation

## 2016-03-19 DIAGNOSIS — Z96651 Presence of right artificial knee joint: Secondary | ICD-10-CM | POA: Diagnosis not present

## 2016-03-19 DIAGNOSIS — Z5189 Encounter for other specified aftercare: Secondary | ICD-10-CM

## 2016-03-19 MED ORDER — LIDOCAINE-EPINEPHRINE (PF) 2 %-1:200000 IJ SOLN
10.0000 mL | Freq: Once | INTRAMUSCULAR | Status: DC
  Filled 2016-03-19: qty 20

## 2016-03-19 NOTE — ED Provider Notes (Signed)
[facility] DEPT Provider Note   CSN: [id number] Arrival date & time: 03/19/16  1449 By signing my name below, I, Georgette Shell, attest that this documentation has been prepared under the direction and in the presence of Carmon Sails, PA-C. Electronically Signed: Georgette Shell, ED Scribe. 03/19/16. 3:49 PM.  History   Chief Complaint Chief Complaint  Patient presents with  . Wound Check    HPI The history is provided by the patient. No language interpreter was used.   HPI Comments: Anita Arroyo is a 56 y.o. female with h/o asthma, L knee replacement, MRSA who presents to the Emergency Department complaining of purulent drainage to the back of left lower thigh beginning today. Pt states a cat jumped on her and bit her in the area on 03/13/16 which was diagnosed as"infected cat bite" on 03/13/16 in ED. Patient was given IV clindamycin in ED and discharged with clindamycin and doxycycline which she started taking on 03/14/16. Since then, pt has had three wound check on 2/18, 2/19, 2/20. Patient states redness has been improving but pain remains.  Patient states this morning she had increased redness and swelling with discharge. Pt is on antibiotics for this without missing any dosages.  Patient has a nonnative left knee replacement, denies knee swelling, redness or pain.  Pt denies fever, chills, or any other associated symptoms. No h/o diabetes. Pt is an everyday smoker.   Patient received rabies igg on 2/17 and 2/20.   Past Medical History:  Diagnosis Date  . ADHD (attention deficit hyperactivity disorder)   . Arthritis   . Asthma   . Bipolar 1 disorder ([place])   . Bipolar 1 disorder ([place])   . Breast cancer ([place])   . Depression   . Dysrhythmia    palpitations  . Dysrhythmia    atrial fibrillation  . Fibromyalgia   . GERD (gastroesophageal reflux disease)   . Heart murmur   . History of stress test 04/28/2011   showed normal perfusion without scar or ischemia  . Hx of echocardiogram  04/28/2011   showed normal systolic function with mild diastolic dysfunction,she had trace MR but did not have frank mitral valve prolapse demonstrated. She had mild pulmonary hypertension with an estimated RV systolic pressure at 34 mm.  . Hyperlipemia   . Meniere disease   . MVP (mitral valve prolapse)   . Neuromuscular disorder [facility])    fibromyalgia    Patient Active Problem List   Diagnosis Date Noted  . History of palpitations 09/16/2015  . Pre-operative cardiovascular examination 09/16/2015  . Bipolar 1 disorder ([place]) 09/16/2015  . Palpitations 08/28/2014  . Hyperlipidemia LDL goal <70 11/26/2013  . History of breast cancer 11/16/2012  . Fibromyalgia 11/16/2012  . History of tobacco abuse 11/16/2012  . MRSA 04/13/2010  . INTERTRIGO 04/13/2010  . GROUP B STREPTOCOCCUS INFECTION, HX OF 04/13/2010  . HYPERLIPIDEMIA 09/02/2006  . DEPRESSION 09/02/2006  . Essential hypertension 09/02/2006  . COPD 09/02/2006  . GERD 09/02/2006    Past Surgical History:  Procedure Laterality Date  . ANKLE SURGERY     left x4  . BREAST SURGERY  2011   lumpectomy right breast   . COLONOSCOPY WITH PROPOFOL N/A 02/25/2014   Procedure: COLONOSCOPY WITH PROPOFOL;  Surgeon: Garlan Fair, MD;  Location: [facility];  Service: Endoscopy;  Laterality: N/A;  . deviated septum    . ECTOPIC PREGNANCY SURGERY    . ELBOW SURGERY     right elbow  . EYE SURGERY  cataract surgery  . fibroid     2-3 fibroid adenomas removed  . JOINT REPLACEMENT  2011   right knee   . KNEE SURGERY     left knee  . SHOULDER ARTHROSCOPY WITH BICEPSTENOTOMY Right 09/25/2015   Procedure: SHOULDER ARTHROSCOPY WITH BICEPSTENOTOMY;  Surgeon: Ninetta Lights, MD;  Location: [facility];  Service: Orthopedics;  Laterality: Right;  . SHOULDER ARTHROSCOPY WITH DISTAL CLAVICLE RESECTION Right 09/25/2015   Procedure: SHOULDER ARTHROSCOPY WITH DISTAL CLAVICLE RESECTION;  Surgeon: Ninetta Lights, MD;  Location:  [facility];  Service: Orthopedics;  Laterality: Right;  . SHOULDER ARTHROSCOPY WITH SUBACROMIAL DECOMPRESSION Right 09/25/2015   Procedure: RIGHT SHOULDER ARTHROSCOPY DEBRIDEMENT,ACROMIOPLASTY,DISTAL CLAVICAL EXCISION, RELEASE OF BICEPS TENDON;  Surgeon: Ninetta Lights, MD;  Location: [facility];  Service: Orthopedics;  Laterality: Right;  . TMJ ARTHROPLASTY    . TONSILLECTOMY    . TOTAL SHOULDER ARTHROPLASTY Left 01/05/2016  . UTERINE FIBROID SURGERY     polups and fibroids removed   . WRIST SURGERY     left    OB History    No data available       Home Medications    Prior to Admission medications   Medication Sig Start Date End Date Taking? Authorizing Provider  atenolol (TENORMIN) 50 MG tablet Take 50 mg by mouth 2 (two) times daily.    Historical Provider, MD  Biotin (BIOTIN 5000) 5 MG CAPS Take 5 mg by mouth daily.    Historical Provider, MD  cetirizine (ZYRTEC) 10 MG tablet Take 10 mg by mouth daily.     Historical Provider, MD  clindamycin (CLEOCIN) 150 MG capsule Take 3 capsules (450 mg total) by mouth 3 (three) times daily. 03/13/16   Nicole Pisciotta, PA-C  doxycycline (VIBRAMYCIN) 100 MG capsule Take 1 capsule (100 mg total) by mouth 2 (two) times daily. 03/13/16   Nicole Pisciotta, PA-C  fluticasone (FLONASE) 50 MCG/ACT nasal spray Place 1 spray into both nostrils 2 (two) times daily.     Historical Provider, MD  Fluticasone-Salmeterol (ADVAIR) 250-50 MCG/DOSE AEPB Inhale 1 puff into the lungs 2 (two) times daily.    Historical Provider, MD  HYDROcodone-acetaminophen (NORCO/VICODIN) 5-325 MG tablet Take 1-2 tablets by mouth every 6 hours as needed for pain and/or cough. 03/13/16   Nicole Pisciotta, PA-C  lamoTRIgine (LAMICTAL) 150 MG tablet Take 1 tablet (150 mg total) by mouth 2 (two) times daily. 02/19/16   Kathlee Nations, MD  Magnesium 250 MG TABS Take 250 mg by mouth daily.    Historical Provider, MD  oxyCODONE-acetaminophen (PERCOCET/ROXICET)  5-325 MG tablet Take 1 tablet by mouth every 4 (four) hours as needed for severe pain.     Historical Provider, MD  pantoprazole (PROTONIX) 40 MG tablet Take 40 mg by mouth daily.     Historical Provider, MD  traZODone (DESYREL) 100 MG tablet Take 2 tablets (200 mg total) by mouth at bedtime. 02/19/16   Kathlee Nations, MD  triamterene-hydrochlorothiazide (MAXZIDE-25) 37.5-25 MG tablet Take 1 tablet by mouth daily.    Historical Provider, MD  venlafaxine XR (EFFEXOR-XR) 150 MG 24 hr capsule Take 150 mg by mouth at bedtime.    Historical Provider, MD    Family History Family History  Problem Relation Age of Onset  . Mitral valve prolapse Mother   . Heart Problems Mother   . Cancer Mother     Social History Social History  Substance Use Topics  . Smoking status:  Current Every Day Smoker    Packs/day: 1.50    Years: 40.00    Types: Cigarettes  . Smokeless tobacco: Never Used     Comment: Cannot afford chantix. Working on getting discount from drug compny  . Alcohol use 0.0 oz/week     Comment: occ     Allergies   Contrast media [iodinated diagnostic agents]; Iodine; Adhesive [tape]; Amoxicillin-pot clavulanate; Ceclor [cefaclor]; Moxifloxacin; and Sulfonamide derivatives   Review of Systems Review of Systems  Constitutional: Negative for fever.  HENT: Negative for congestion and sore throat.   Eyes: Negative for visual disturbance.  Respiratory: Negative for cough and shortness of breath.   Cardiovascular: Negative for chest pain.  Gastrointestinal: Negative for abdominal pain, constipation, diarrhea, nausea and vomiting.  Genitourinary: Negative for difficulty urinating.  Musculoskeletal: Negative for arthralgias.  Skin: Positive for color change and wound.  Neurological: Positive for headaches. Negative for dizziness, weakness and light-headedness.     Physical Exam Updated Vital Signs BP 120/66 (BP Location: Left Arm)   Pulse 66   Temp 98.7 F (37.1 C) (Oral)   Resp  16   LMP 06/25/2011   SpO2 99%   Physical Exam  Constitutional: She is oriented to person, place, and time. She appears well-developed and well-nourished. No distress.  HENT:  Head: Normocephalic and atraumatic.  Right Ear: External ear normal.  Left Ear: External ear normal.  Nose: Nose normal.  Mouth/Throat: Oropharynx is clear and moist. No oropharyngeal exudate.  Eyes: Conjunctivae and EOM are normal. Pupils are equal, round, and reactive to light. No scleral icterus.  Neck: Normal range of motion. Neck supple. No JVD present.  Cardiovascular: Normal rate, regular rhythm and normal heart sounds.   No murmur heard. Pulmonary/Chest: Effort normal and breath sounds normal. She has no wheezes.  Abdominal: Soft. There is no tenderness.  Musculoskeletal: Normal range of motion. She exhibits no deformity.  Lymphadenopathy:    She has no cervical adenopathy.  Neurological: She is alert and oriented to person, place, and time.  Skin: Skin is warm and dry. Capillary refill takes less than 2 seconds. There is erythema.  Erythema, tenderness, warmth and induration with purulent discharge measuring 10x8 cm to back of lower right thigh.  Psychiatric: She has a normal mood and affect. Her behavior is normal. Judgment and thought content normal.  Nursing note and vitals reviewed.      ED Treatments / Results  DIAGNOSTIC STUDIES: Oxygen Saturation is 97% on RA, adequate by my interpretation.    COORDINATION OF CARE: 3:49 PM Discussed treatment plan with pt at bedside and pt agreed to plan.  Labs (all labs ordered are listed, but only abnormal results are displayed) Labs Reviewed - No data to display  EKG  EKG Interpretation None       Radiology No results found.  Procedures .Marland KitchenIncision and Drainage Date/Time: 03/19/2016 5:24 PM Performed by: Kinnie Feil Authorized by: Kinnie Feil   Consent:    Consent obtained:  Verbal   Consent given by:  Patient Location:     Type:  Abscess   Size:  10x8 cm   Location:  Lower extremity   Lower extremity location:  Leg   Leg location:  R upper leg Pre-procedure details:    Skin preparation:  Betadine Anesthesia (see MAR for exact dosages):    Anesthesia method:  Local infiltration   Local anesthetic:  Lidocaine 2% w/o epi Procedure type:    Complexity:  Simple Procedure details:  Incision types:  Stab incision   Incision depth:  Dermal   Wound management:  Probed and deloculated and irrigated with saline   Drainage:  Purulent   Drainage amount:  Moderate   Wound treatment:  Wound left open   Packing materials:  None Post-procedure details:    Patient tolerance of procedure:  Tolerated well, no immediate complications    Medications Ordered in ED Medications - No data to display   Initial Impression / Assessment and Plan / ED Course  I have reviewed the triage vital signs and the nursing notes.  Pertinent labs & imaging results that were available during my care of the patient were reviewed by me and considered in my medical decision making (see chart for details).     56 yo female with pertinent pmh of left knee replacement, MRSA and tobacco abuse presents today for wound check.  Patient was bit by a stray cat on posterior left thigh on 03/13/16 and was diagnosed as"infected cat bite" on 03/13/16 in ED. Patient was given IV clindamycin in ED on 03/13/16 and discharged with clindamycin and doxycycline which she started taking on 03/14/16. Since then, pt has had three wound checks on 2/18, 2/19, 2/20 with improving symptoms.  Today patient states her wound feels "harder" and it started draining.  On exam there is centralized area of induration which was amenable to I&D which produced moderate amounts of purulent discharge.  Redness has not reached original marker lines from 03/13/16 however wound developed an abscess which was successfully drained today in ED. I do not think infection has worsened to  warrant change or step up in antibiotics at this time.  I suspect surrounding cellulitis will improve in the next 24-48 hours after I&D with continued abx and aggressive hot compresses.   Patient tolerated procedure well without complications.  Betadine was used today during I&D.  Iodine was listed on her allergy list with shortness of breath as a reaction, last updated in 2016.  I asked patient what was her reaction to iodine and she stated she had a "bad reaction" to iodine in contrast once which caused her blood pressure to drop. I asked patient if she had had a skin reaction to betodine before and she stated no and "if you clean it well it won't be a problem". Area of betadine was cleansed thoroughly with wound cleanser x 2 and water x2.  Betadine was on skin for a maximum of 5 mins during procedure.  Unclear if iodine is listed correctly under allergies, advised patient to monitor skin for signs of allergy reaction.  Patient will be discharged at this time.  Advised to continue taking clindamycin and doxycycline, aggressive hot compresses and return for wound check and rabies immunization in 24 hours.  New marker outline done today.    Patient discussed with Dr. Eulis Foster who evaluated patient and assisted in MDM.   Final Clinical Impressions(s) / ED Diagnoses   Final diagnoses:  Visit for wound check  Abscess    New Prescriptions Discharge Medication List as of 03/19/2016  5:27 PM     I personally performed the services described in this documentation, which was scribed in my presence. The recorded information has been reviewed and is accurate.     Kinnie Feil, PA-C 03/19/16 [address], PA-C 03/20/16 [place], MD 03/21/16 [phone]

## 2016-03-19 NOTE — Discharge Instructions (Signed)
Continue taking antibiotics as prescribed. Take prescribed hydrocodone for pain as needed.   Start doing hot compresses multiple times a day as often as possible.   Return to emergency department for wound check and rabies immunization tomorrow.

## 2016-03-19 NOTE — ED Triage Notes (Signed)
Pt reports she was seen here for same Saturday.  She reports a cat jumped on her and bit her in back of her L lower thigh.  Redness noted and she reports noticing purulent drainage today.  Painful and is giving her a h/a.  Pt is on abx without relief.

## 2016-03-19 NOTE — ED Notes (Signed)
Lido given by PA. Bottle in sharps/

## 2016-03-19 NOTE — ED Provider Notes (Signed)
  Face-to-face evaluation   History: Patient here for evaluation of worsening pain and swelling in her wound, caused by a cat bite.  She has not been using heat on the sore area.   Physical exam: Alert overweight female.  Right posterior lower thigh, with area of induration approximately 5 cm, within a line drawn recently, and mild surrounding erythema.  Centrally there is a fluctuant mass consistent with abscess and draining some purulent material.  Plan-I&D, continue same antibiotics, aggressive warm compresses.  Medical screening examination/treatment/procedure(s) were conducted as a shared visit with non-physician practitioner(s) and myself.  I personally evaluated the patient during the encounter    Daleen Bo, MD 03/21/16 1339

## 2016-03-20 ENCOUNTER — Emergency Department (HOSPITAL_COMMUNITY)
Admission: EM | Admit: 2016-03-20 | Discharge: 2016-03-20 | Disposition: A | Discharge status: Home / Self Care | Payer: Medicare HMO | Attending: Emergency Medicine | Admitting: Emergency Medicine

## 2016-03-20 ENCOUNTER — Encounter (HOSPITAL_COMMUNITY): Payer: Self-pay | Admitting: Emergency Medicine

## 2016-03-20 DIAGNOSIS — J45909 Unspecified asthma, uncomplicated: Secondary | ICD-10-CM | POA: Insufficient documentation

## 2016-03-20 DIAGNOSIS — Z96651 Presence of right artificial knee joint: Secondary | ICD-10-CM | POA: Diagnosis not present

## 2016-03-20 DIAGNOSIS — Z79899 Other long term (current) drug therapy: Secondary | ICD-10-CM | POA: Insufficient documentation

## 2016-03-20 DIAGNOSIS — I10 Essential (primary) hypertension: Secondary | ICD-10-CM | POA: Diagnosis not present

## 2016-03-20 DIAGNOSIS — F17210 Nicotine dependence, cigarettes, uncomplicated: Secondary | ICD-10-CM | POA: Insufficient documentation

## 2016-03-20 DIAGNOSIS — Z4801 Encounter for change or removal of surgical wound dressing: Secondary | ICD-10-CM | POA: Diagnosis not present

## 2016-03-20 DIAGNOSIS — Z96612 Presence of left artificial shoulder joint: Secondary | ICD-10-CM | POA: Diagnosis not present

## 2016-03-20 DIAGNOSIS — F909 Attention-deficit hyperactivity disorder, unspecified type: Secondary | ICD-10-CM | POA: Insufficient documentation

## 2016-03-20 DIAGNOSIS — Z5189 Encounter for other specified aftercare: Secondary | ICD-10-CM

## 2016-03-20 MED ORDER — RABIES VACCINE, PCEC IM SUSR
1.0000 mL | Freq: Once | INTRAMUSCULAR | Status: AC
  Administered 2016-03-20: 1 mL via INTRAMUSCULAR
  Filled 2016-03-20: qty 1

## 2016-03-20 NOTE — ED Triage Notes (Signed)
Pt is being seen for follow up, wound check. Cat bite to  L/thigh. Clear, blood tinged drainage from wound. PA at bedside.

## 2016-03-20 NOTE — ED Provider Notes (Signed)
[facility] DEPT Provider Note    By signing my name below, I, Bea Graff, attest that this documentation has been prepared under the direction and in the presence of Shary Decamp, PA-C. Electronically Signed: Bea Graff, ED Scribe. 03/20/16. 1:17 PM.    History   Chief Complaint Chief Complaint  Patient presents with  . Wound Infection    wound check   The history is provided by the patient and medical records. No language interpreter was used.    Anita Arroyo is a 56 y.o. female with PMHx of breast cancer (remission), HLD, HTN who presents to the Emergency Department needing a wound check after being bitten by a stray cat on posterior left thigh on 03/13/16. She was seen after the incident and was diagnosed as "infected cat bite" on 03/13/16 in ED. Patient was given IV clindamycin in ED on 03/13/16 and discharged with clindamycin and doxycycline which she started taking on 03/14/16. Since then, pt has had three wound checks on 2/18, 2/19, 2/20 with improving symptoms. She was seen yesterday and had an incision and drainage. She reports associated drainage and hardness around the wound. She has been taking Clindamycin and Doxycycline as directed. Touching the area increases the pain. She denies alleviating factors. She denies fever, chills, nausea, vomiting. Pt is on day 7 of her rabies vaccinations and is due again on 03/27/16 and 04/10/16.  Past Medical History:  Diagnosis Date  . ADHD (attention deficit hyperactivity disorder)   . Arthritis   . Asthma   . Bipolar 1 disorder ([place])   . Bipolar 1 disorder ([place])   . Breast cancer ([place])   . Depression   . Dysrhythmia    palpitations  . Dysrhythmia    atrial fibrillation  . Fibromyalgia   . GERD (gastroesophageal reflux disease)   . Heart murmur   . History of stress test 04/28/2011   showed normal perfusion without scar or ischemia  . Hx of echocardiogram 04/28/2011   showed normal systolic function with mild diastolic  dysfunction,she had trace MR but did not have frank mitral valve prolapse demonstrated. She had mild pulmonary hypertension with an estimated RV systolic pressure at 34 mm.  . Hyperlipemia   . Meniere disease   . MVP (mitral valve prolapse)   . Neuromuscular disorder [facility])    fibromyalgia    Patient Active Problem List   Diagnosis Date Noted  . History of palpitations 09/16/2015  . Pre-operative cardiovascular examination 09/16/2015  . Bipolar 1 disorder ([place]) 09/16/2015  . Palpitations 08/28/2014  . Hyperlipidemia LDL goal <70 11/26/2013  . History of breast cancer 11/16/2012  . Fibromyalgia 11/16/2012  . History of tobacco abuse 11/16/2012  . MRSA 04/13/2010  . INTERTRIGO 04/13/2010  . GROUP B STREPTOCOCCUS INFECTION, HX OF 04/13/2010  . HYPERLIPIDEMIA 09/02/2006  . DEPRESSION 09/02/2006  . Essential hypertension 09/02/2006  . COPD 09/02/2006  . GERD 09/02/2006    Past Surgical History:  Procedure Laterality Date  . ANKLE SURGERY     left x4  . BREAST SURGERY  2011   lumpectomy right breast   . COLONOSCOPY WITH PROPOFOL N/A 02/25/2014   Procedure: COLONOSCOPY WITH PROPOFOL;  Surgeon: Garlan Fair, MD;  Location: [facility];  Service: Endoscopy;  Laterality: N/A;  . deviated septum    . ECTOPIC PREGNANCY SURGERY    . ELBOW SURGERY     right elbow  . EYE SURGERY     cataract surgery  . fibroid     2-3  fibroid adenomas removed  . JOINT REPLACEMENT  2011   right knee   . KNEE SURGERY     left knee  . SHOULDER ARTHROSCOPY WITH BICEPSTENOTOMY Right 09/25/2015   Procedure: SHOULDER ARTHROSCOPY WITH BICEPSTENOTOMY;  Surgeon: Ninetta Lights, MD;  Location: [facility];  Service: Orthopedics;  Laterality: Right;  . SHOULDER ARTHROSCOPY WITH DISTAL CLAVICLE RESECTION Right 09/25/2015   Procedure: SHOULDER ARTHROSCOPY WITH DISTAL CLAVICLE RESECTION;  Surgeon: Ninetta Lights, MD;  Location: [facility];  Service: Orthopedics;  Laterality: Right;   . SHOULDER ARTHROSCOPY WITH SUBACROMIAL DECOMPRESSION Right 09/25/2015   Procedure: RIGHT SHOULDER ARTHROSCOPY DEBRIDEMENT,ACROMIOPLASTY,DISTAL CLAVICAL EXCISION, RELEASE OF BICEPS TENDON;  Surgeon: Ninetta Lights, MD;  Location: [facility];  Service: Orthopedics;  Laterality: Right;  . TMJ ARTHROPLASTY    . TONSILLECTOMY    . TOTAL SHOULDER ARTHROPLASTY Left 01/05/2016  . UTERINE FIBROID SURGERY     polups and fibroids removed   . WRIST SURGERY     left    OB History    No data available       Home Medications    Prior to Admission medications   Medication Sig Start Date End Date Taking? Authorizing Provider  atenolol (TENORMIN) 50 MG tablet Take 50 mg by mouth 2 (two) times daily.    Historical Provider, MD  Biotin (BIOTIN 5000) 5 MG CAPS Take 5 mg by mouth daily.    Historical Provider, MD  cetirizine (ZYRTEC) 10 MG tablet Take 10 mg by mouth daily.     Historical Provider, MD  clindamycin (CLEOCIN) 150 MG capsule Take 3 capsules (450 mg total) by mouth 3 (three) times daily. 03/13/16   Nicole Pisciotta, PA-C  doxycycline (VIBRAMYCIN) 100 MG capsule Take 1 capsule (100 mg total) by mouth 2 (two) times daily. 03/13/16   Nicole Pisciotta, PA-C  fluticasone (FLONASE) 50 MCG/ACT nasal spray Place 1 spray into both nostrils 2 (two) times daily.     Historical Provider, MD  Fluticasone-Salmeterol (ADVAIR) 250-50 MCG/DOSE AEPB Inhale 1 puff into the lungs 2 (two) times daily.    Historical Provider, MD  HYDROcodone-acetaminophen (NORCO/VICODIN) 5-325 MG tablet Take 1-2 tablets by mouth every 6 hours as needed for pain and/or cough. 03/13/16   Nicole Pisciotta, PA-C  lamoTRIgine (LAMICTAL) 150 MG tablet Take 1 tablet (150 mg total) by mouth 2 (two) times daily. 02/19/16   Kathlee Nations, MD  Magnesium 250 MG TABS Take 250 mg by mouth daily.    Historical Provider, MD  oxyCODONE-acetaminophen (PERCOCET/ROXICET) 5-325 MG tablet Take 1 tablet by mouth every 4 (four) hours as needed  for severe pain.     Historical Provider, MD  pantoprazole (PROTONIX) 40 MG tablet Take 40 mg by mouth daily.     Historical Provider, MD  traZODone (DESYREL) 100 MG tablet Take 2 tablets (200 mg total) by mouth at bedtime. 02/19/16   Kathlee Nations, MD  triamterene-hydrochlorothiazide (MAXZIDE-25) 37.5-25 MG tablet Take 1 tablet by mouth daily.    Historical Provider, MD  venlafaxine XR (EFFEXOR-XR) 150 MG 24 hr capsule Take 150 mg by mouth at bedtime.    Historical Provider, MD    Family History Family History  Problem Relation Age of Onset  . Mitral valve prolapse Mother   . Heart Problems Mother   . Cancer Mother     Social History Social History  Substance Use Topics  . Smoking status: Current Every Day Smoker    Packs/day: 1.50  Years: 40.00    Types: Cigarettes  . Smokeless tobacco: Never Used     Comment: Cannot afford chantix. Working on getting discount from drug compny  . Alcohol use 0.0 oz/week     Comment: occ     Allergies   Contrast media [iodinated diagnostic agents]; Iodine; Adhesive [tape]; Amoxicillin-pot clavulanate; Ceclor [cefaclor]; Moxifloxacin; and Sulfonamide derivatives   Review of Systems Review of Systems  Constitutional: Negative for chills and fever.  Gastrointestinal: Negative for nausea and vomiting.  Skin: Positive for wound.     Physical Exam Updated Vital Signs BP 100/86   Pulse 74   Temp 98.8 F (37.1 C)   Resp 16   LMP 06/25/2011   SpO2 93%   Physical Exam  Constitutional: She is oriented to person, place, and time. She appears well-developed and well-nourished.  HENT:  Head: Normocephalic and atraumatic.  Neck: Normal range of motion.  Cardiovascular: Normal rate.   Pulmonary/Chest: Effort normal.  Musculoskeletal: Normal range of motion.  Neurological: She is alert and oriented to person, place, and time.  Skin: Skin is warm and dry.  See photo. Area is indurated but non fluctuant.  Psychiatric: She has a normal  mood and affect. Her behavior is normal.  Nursing note and vitals reviewed.      ED Treatments / Results  DIAGNOSTIC STUDIES: Oxygen Saturation is 93% on RA, low by my interpretation.   COORDINATION OF CARE: 1:00 PM- Will perform bedside ultrasound. Reassured pt that wound seems to be improving. Will give next rabies vaccination. Advised pt to continue all her antibiotics until finished. Pt verbalizes understanding and agrees to plan.  Medications - No data to display  Labs (all labs ordered are listed, but only abnormal results are displayed) Labs Reviewed - No data to display  EKG  EKG Interpretation None       Radiology No results found.  Procedures [place] bedside Date/Time: 03/20/2016 1:05 PM Performed by: Shary Decamp Authorized by: Shary Decamp  Consent: Verbal consent obtained. Written consent not obtained. Risks and benefits: risks, benefits and alternatives were discussed Consent given by: patient Patient understanding: patient states understanding of the procedure being performed Patient consent: the patient's understanding of the procedure matches consent given Procedure consent: procedure consent matches procedure scheduled Relevant documents: relevant documents present and verified Test results: test results available and properly labeled Site marked: the operative site was marked Imaging studies: imaging studies available Required items: required blood products, implants, devices, and special equipment available Patient identity confirmed: verbally with patient and hospital-assigned identification number Time out: Immediately prior to procedure a "time out" was called to verify the correct patient, procedure, equipment, support staff and site/side marked as required. Preparation: Patient was prepped and draped in the usual sterile fashion. Local anesthesia used: no  Anesthesia: Local anesthesia used: no  Sedation: Patient sedated: no Patient tolerance:  Patient tolerated the procedure well with no immediate complications    (including critical care time)  Medications Ordered in ED Medications - No data to display   Initial Impression / Assessment and Plan / ED Course  I have reviewed the triage vital signs and the nursing notes.  Pertinent labs & imaging results that were available during my care of the patient were reviewed by me and considered in my medical decision making (see chart for details).  Final Clinical Impressions(s) / ED Diagnoses  I have reviewed the relevant previous healthcare records. I obtained HPI from historian.  ED Course:   Assessment: Pt  is a 56 y.o. female with hx breast cancer (remission), HLD, HTN who presents for wound check. I+ D yesterday. On Clinda and Doxy currently. On exam, pt in NAD. Nontoxic/nonseptic appearing. VSS. Afebrile. Area with improving cellulitis. Non fluctuant. Bedside ultrasound without abscess noted. Given rabies vaccine for Day 7 in ED. Plan is to DC home with follow up to PCP for continued treatment and subsequent rabies vaccinations. Continue ABX. Strict return precaution given. At time of discharge, Patient is in no acute distress. Vital Signs are stable. Patient is able to ambulate. Patient able to tolerate PO.   Disposition/Plan:  DC Home Additional Verbal discharge instructions given and discussed with patient.  Pt Instructed to f/u with PCP in the next week for evaluation and treatment of symptoms. Return precautions given Pt acknowledges and agrees with plan  Supervising Physician Drenda Freeze, MD  Final diagnoses:  Visit for wound check    New Prescriptions New Prescriptions   No medications on file     Shary Decamp, PA-C 03/20/16 [place], MD 03/20/16 1640

## 2016-03-20 NOTE — Discharge Instructions (Signed)
Please read and follow all provided instructions.  Your diagnoses today include:  1. Visit for wound check     Tests performed today include: Vital signs. See below for your results today.   Medications prescribed:  Take as prescribed   Home care instructions:  Follow any educational materials contained in this packet.  Follow-up instructions: Please follow-up with your primary care provider for further evaluation of symptoms and treatment   Return instructions:  Please return to the Emergency Department if you do not get better, if you get worse, or new symptoms OR  - Fever (temperature greater than 101.3F)  - Bleeding that does not stop with holding pressure to the area    -Severe pain (please note that you may be more sore the day after your accident)  - Chest Pain  - Difficulty breathing  - Severe nausea or vomiting  - Inability to tolerate food and liquids  - Passing out  - Skin becoming red around your wounds  - Change in mental status (confusion or lethargy)  - New numbness or weakness    Please return if you have any other emergent concerns.  Additional Information:  Your vital signs today were: BP 100/86    Pulse 74    Temp 98.8 F (37.1 C)    Resp 16    LMP 06/25/2011    SpO2 93%  If your blood pressure (BP) was elevated above 135/85 this visit, please have this repeated by your doctor within one month. ---------------

## 2016-03-22 ENCOUNTER — Encounter: Payer: Self-pay | Admitting: Physical Therapy

## 2016-03-23 ENCOUNTER — Ambulatory Visit (INDEPENDENT_AMBULATORY_CARE_PROVIDER_SITE_OTHER): Payer: Medicare HMO | Admitting: Family Medicine

## 2016-03-23 VITALS — BP 112/74 | HR 74 | Temp 98.7°F | Resp 16 | Ht 63.0 in | Wt 173.4 lb

## 2016-03-23 DIAGNOSIS — L03116 Cellulitis of left lower limb: Secondary | ICD-10-CM | POA: Diagnosis not present

## 2016-03-23 MED ORDER — DOXYCYCLINE HYCLATE 100 MG PO CAPS: 100.0000 mg | ORAL_CAPSULE | Freq: Two times a day (BID) | ORAL | 0 refills | Status: DC

## 2016-03-23 NOTE — Progress Notes (Signed)
Patient ID: Anita Arroyo, female    DOB: 31-Jul-1960, 56 y.o.   MRN: [id number]  PCP: Pcp Not In System  Chief Complaint  Patient presents with  . Wound Check    f/u hospital    Subjective:  HPI 56 year old female presents for wound evaluation following a cat bite of right upper leg that occurred 03/13/16. She initially received care at [facility] ED in which she received IV Clindamycin and was discharged on oral clindamycin and doxycycline. She has had 5 subsequent visit at the ED for wound follow-up. During wound visit on 03/19/16 I&D was performed with moderate amount of drainage. Today, patient presents for wound evaluation. Upon arrival in the exam room patient was verbally aggressive and demanded to se an MD. After being advised of the length of wait time to see another provider she agreed to be seen and evaluated by me. She reports the wound of her upper right leg has recently began feeling tender. She has one dose  Continues to take clindamycin and has 3 remaining doses. Her doxycyline will be completed after one more dose. She reports the wound was initially draining and hasn't recently and feels more "harden" as it did prior to the I&D that was completed on 03/19/16. Denies fever, chills, or nausea.  Social History   Social History  . Marital status: Single    Spouse name: N/A  . Number of children: N/A  . Years of education: N/A   Occupational History  . Not on file.   Social History Main Topics  . Smoking status: Current Every Day Smoker    Packs/day: 1.50    Years: 40.00    Types: Cigarettes  . Smokeless tobacco: Never Used     Comment: Cannot afford chantix. Working on getting discount from drug compny  . Alcohol use 0.0 oz/week     Comment: occ  . Drug use: No  . Sexual activity: Not on file   Other Topics Concern  . Not on file   Social History Narrative  . No narrative on file    Family History  Problem Relation Age of Onset  . Mitral valve prolapse  Mother   . Heart Problems Mother   . Cancer Mother    Review of Systems  See HPI  Patient Active Problem List   Diagnosis Date Noted  . History of palpitations 09/16/2015  . Pre-operative cardiovascular examination 09/16/2015  . Bipolar 1 disorder ([place]) 09/16/2015  . Palpitations 08/28/2014  . Hyperlipidemia LDL goal <70 11/26/2013  . History of breast cancer 11/16/2012  . Fibromyalgia 11/16/2012  . History of tobacco abuse 11/16/2012  . MRSA 04/13/2010  . INTERTRIGO 04/13/2010  . GROUP B STREPTOCOCCUS INFECTION, HX OF 04/13/2010  . HYPERLIPIDEMIA 09/02/2006  . DEPRESSION 09/02/2006  . Essential hypertension 09/02/2006  . COPD 09/02/2006  . GERD 09/02/2006    Allergies  Allergen Reactions  . Contrast Media [Iodinated Diagnostic Agents] Shortness Of Breath  . Iodine Shortness Of Breath  . Adhesive [Tape] Other (See Comments)    Reaction:  Blisters   . Amoxicillin-Pot Clavulanate Rash and Other (See Comments)    Has patient had a PCN reaction causing immediate rash, facial/tongue/throat swelling, SOB or lightheadedness with hypotension: No Has patient had a PCN reaction causing severe rash involving mucus membranes or skin necrosis: No Has patient had a PCN reaction that required hospitalization No Has patient had a PCN reaction occurring within the last 10 years: No If all of  the above answers are "NO", then may proceed with Cephalosporin use.  Blair Dolphin [Cefaclor] Rash  . Moxifloxacin Rash  . Sulfonamide Derivatives Nausea And Vomiting    Prior to Admission medications   Medication Sig Start Date End Date Taking? Authorizing Provider  atenolol (TENORMIN) 50 MG tablet Take 50 mg by mouth 2 (two) times daily.   Yes Historical Provider, MD  Biotin (BIOTIN 5000) 5 MG CAPS Take 5 mg by mouth daily.   Yes Historical Provider, MD  cetirizine (ZYRTEC) 10 MG tablet Take 10 mg by mouth daily.    Yes Historical Provider, MD  clindamycin (CLEOCIN) 150 MG capsule Take 3 capsules  (450 mg total) by mouth 3 (three) times daily. 03/13/16  Yes Nicole Pisciotta, PA-C  doxycycline (VIBRAMYCIN) 100 MG capsule Take 1 capsule (100 mg total) by mouth 2 (two) times daily. 03/13/16  Yes Nicole Pisciotta, PA-C  fluticasone (FLONASE) 50 MCG/ACT nasal spray Place 1 spray into both nostrils 2 (two) times daily.    Yes Historical Provider, MD  Fluticasone-Salmeterol (ADVAIR) 250-50 MCG/DOSE AEPB Inhale 1 puff into the lungs 2 (two) times daily.   Yes Historical Provider, MD  HYDROcodone-acetaminophen (NORCO/VICODIN) 5-325 MG tablet Take 1-2 tablets by mouth every 6 hours as needed for pain and/or cough. 03/13/16  Yes Nicole Pisciotta, PA-C  lamoTRIgine (LAMICTAL) 150 MG tablet Take 1 tablet (150 mg total) by mouth 2 (two) times daily. 02/19/16  Yes Kathlee Nations, MD  Magnesium 250 MG TABS Take 250 mg by mouth daily.   Yes Historical Provider, MD  pantoprazole (PROTONIX) 40 MG tablet Take 40 mg by mouth daily.    Yes Historical Provider, MD  traZODone (DESYREL) 100 MG tablet Take 2 tablets (200 mg total) by mouth at bedtime. 02/19/16  Yes Kathlee Nations, MD  triamterene-hydrochlorothiazide (MAXZIDE-25) 37.5-25 MG tablet Take 1 tablet by mouth daily.   Yes Historical Provider, MD  venlafaxine XR (EFFEXOR-XR) 150 MG 24 hr capsule Take 150 mg by mouth at bedtime.   Yes Historical Provider, MD  oxyCODONE-acetaminophen (PERCOCET/ROXICET) 5-325 MG tablet Take 1 tablet by mouth every 4 (four) hours as needed for severe pain.     Historical Provider, MD    Past Medical, Surgical Family and Social History reviewed and updated.    Objective:   Today's Vitals   03/23/16 1543  BP: 112/74  Pulse: 74  Resp: 16  Temp: 98.7 F (37.1 C)  TempSrc: Oral  SpO2: 97%  Weight: 173 lb 6.6 oz (78.7 kg)  Height: 5\' 3"  (1.6 m)    Wt Readings from Last 3 Encounters:  03/23/16 173 lb 6.6 oz (78.7 kg)  03/15/16 173 lb (78.5 kg)  03/13/16 173 lb (78.5 kg)    Physical Exam  Constitutional: She is oriented to  person, place, and time. She appears well-developed and well-nourished.  Cardiovascular: Normal rate.   Pulmonary/Chest: Effort normal.  Neurological: She is alert and oriented to person, place, and time.  Skin: There is erythema.     Psychiatric: She has a normal mood and affect. Her behavior is normal. Judgment and thought content normal.   Attempted to I & D wound  Initial incision patient experienced pain and  sanguinous material only obtained.  Second incision unsuccessful of draining pus. Hemostasis achieved. Wound dressed.   Assessment & Plan:  1. Cellulitis of left lower extremity -Erythema has expanded beyond the markings 2.23.18 from the wound check completed on that date. Concern for worsening cellulitis. Pt reporting tenderness.  Plan: -  Doxycyline 100 mg twice daily x 14 days. -Complete Clindamycin  -Return for wound check 03/26/16 for wound care  Return sooner if wound worsens.   Carroll Sage. Kenton Kingfisher, MSN, FNP-C [facility]

## 2016-03-23 NOTE — Patient Instructions (Addendum)
Keep dressing on leg for 24 hours and then you may remove and leave open to air.  Continue warm compresses up to 3 times daily.  Complete Clindamycin and I am extending your doxycycline another 10 days.  IF you received an x-ray today, you will receive an invoice from [facility]. Please contact [facility] at [phone] with questions or concerns regarding your invoice.   IF you received labwork today, you will receive an invoice from [facility]. Please contact [facility] at [phone] with questions or concerns regarding your invoice.   Our billing staff will not be able to assist you with questions regarding bills from these companies.  You will be contacted with the lab results as soon as they are available. The fastest way to get your results is to activate your My Chart account. Instructions are located on the last page of this paperwork. If you have not heard from [place] regarding the results in 2 weeks, please contact this office.      Cellulitis, Adult Cellulitis is a skin infection. The infected area is usually red and sore. This condition occurs most often in the arms and lower legs. It is very important to get treated for this condition. Follow these instructions at home:  Take over-the-counter and prescription medicines only as told by your doctor.  If you were prescribed an antibiotic medicine, take it as told by your doctor. Do not stop taking the antibiotic even if you start to feel better.  Drink enough fluid to keep your pee (urine) clear or pale yellow.  Do not touch or rub the infected area.  Raise (elevate) the infected area above the level of your heart while you are sitting or lying down.  Place warm or cold wet cloths (warm or cold compresses) on the infected area. Do this as told by your doctor.  Keep all follow-up visits as told by your doctor. This is important. These visits let your doctor make sure your infection is not getting  worse. Contact a doctor if:  You have a fever.  Your symptoms do not get better after 1-2 days of treatment.  Your bone or joint under the infected area starts to hurt after the skin has healed.  Your infection comes back. This can happen in the same area or another area.  You have a swollen bump in the infected area.  You have new symptoms.  You feel ill and also have muscle aches and pains. Get help right away if:  Your symptoms get worse.  You feel very sleepy.  You throw up (vomit) or have watery poop (diarrhea) for a long time.  There are red streaks coming from the infected area.  Your red area gets larger.  Your red area turns darker. This information is not intended to replace advice given to you by your health care provider. Make sure you discuss any questions you have with your health care provider. Document Released: 06/30/2007 Document Revised: 06/19/2015 Document Reviewed: 11/20/2014 Elsevier Interactive Patient Education  2017 [employer].

## 2016-03-24 ENCOUNTER — Encounter: Payer: Self-pay | Admitting: Physical Therapy

## 2016-03-26 ENCOUNTER — Ambulatory Visit: Payer: Medicare HMO

## 2016-03-27 ENCOUNTER — Encounter (HOSPITAL_COMMUNITY): Payer: Self-pay | Admitting: Emergency Medicine

## 2016-03-27 ENCOUNTER — Emergency Department (HOSPITAL_COMMUNITY)
Admission: EM | Admit: 2016-03-27 | Discharge: 2016-03-27 | Disposition: A | Discharge status: Home / Self Care | Payer: Medicare HMO | Attending: Emergency Medicine | Admitting: Emergency Medicine

## 2016-03-27 DIAGNOSIS — J449 Chronic obstructive pulmonary disease, unspecified: Secondary | ICD-10-CM | POA: Diagnosis not present

## 2016-03-27 DIAGNOSIS — Z23 Encounter for immunization: Secondary | ICD-10-CM | POA: Diagnosis not present

## 2016-03-27 DIAGNOSIS — F909 Attention-deficit hyperactivity disorder, unspecified type: Secondary | ICD-10-CM | POA: Diagnosis not present

## 2016-03-27 DIAGNOSIS — Z96612 Presence of left artificial shoulder joint: Secondary | ICD-10-CM | POA: Diagnosis not present

## 2016-03-27 DIAGNOSIS — Z203 Contact with and (suspected) exposure to rabies: Secondary | ICD-10-CM | POA: Insufficient documentation

## 2016-03-27 DIAGNOSIS — I10 Essential (primary) hypertension: Secondary | ICD-10-CM | POA: Insufficient documentation

## 2016-03-27 DIAGNOSIS — Z96651 Presence of right artificial knee joint: Secondary | ICD-10-CM | POA: Diagnosis not present

## 2016-03-27 DIAGNOSIS — L03116 Cellulitis of left lower limb: Secondary | ICD-10-CM | POA: Diagnosis not present

## 2016-03-27 DIAGNOSIS — F17210 Nicotine dependence, cigarettes, uncomplicated: Secondary | ICD-10-CM | POA: Diagnosis not present

## 2016-03-27 DIAGNOSIS — Z853 Personal history of malignant neoplasm of breast: Secondary | ICD-10-CM | POA: Insufficient documentation

## 2016-03-27 DIAGNOSIS — Z79899 Other long term (current) drug therapy: Secondary | ICD-10-CM | POA: Insufficient documentation

## 2016-03-27 LAB — CBG MONITORING, ED: Glucose-Capillary: 89 mg/dL (ref 65–99)

## 2016-03-27 MED ORDER — RABIES VACCINE, PCEC IM SUSR
1.0000 mL | Freq: Once | INTRAMUSCULAR | Status: AC
  Administered 2016-03-27: 1 mL via INTRAMUSCULAR
  Filled 2016-03-27: qty 1

## 2016-03-27 NOTE — ED Provider Notes (Signed)
[facility] DEPT Provider Note   CSN: [id number] Arrival date & time: 03/27/16  1321  By signing my name below, I, Sonum Patel, attest that this documentation has been prepared under the direction and in the presence of [employer], PA-C. Electronically Signed: Sonum Patel, Education administrator. 03/27/16. 2:13 PM.  History   Chief Complaint Chief Complaint  Patient presents with  . Rabies Injection    The history is provided by the patient. No language interpreter was used.     HPI Comments: Anita Arroyo is a 56 y.o. female who presents to the Emergency Department today for the remainder of the rabies vaccine. She was originally seen on 03/13/16 for a stray cat bite to the posterior left lower extremity. She has been seen multiple times since then for a subsequent skin infection as well as prophylactic rabies vaccination. She was most recently seen on 03/23/16 at [facility]; they attempted to I&D that wound but it did not produce much drainage so she was restarted on doxycycline and advised to finish the Clindamycin. She states she has been compliant with the antibiotics she has been prescribed. She denies any other associated issues at this time. No modifying factors.   Past Medical History:  Diagnosis Date  . ADHD (attention deficit hyperactivity disorder)   . Arthritis   . Asthma   . Bipolar 1 disorder ([place])   . Bipolar 1 disorder ([place])   . Breast cancer ([place])   . Depression   . Dysrhythmia    palpitations  . Dysrhythmia    atrial fibrillation  . Fibromyalgia   . GERD (gastroesophageal reflux disease)   . Heart murmur   . History of stress test 04/28/2011   showed normal perfusion without scar or ischemia  . Hx of echocardiogram 04/28/2011   showed normal systolic function with mild diastolic dysfunction,she had trace MR but did not have frank mitral valve prolapse demonstrated. She had mild pulmonary hypertension with an estimated RV systolic pressure at 34 mm.  . Hyperlipemia   .  Meniere disease   . MVP (mitral valve prolapse)   . Neuromuscular disorder [facility])    fibromyalgia    Patient Active Problem List   Diagnosis Date Noted  . History of palpitations 09/16/2015  . Pre-operative cardiovascular examination 09/16/2015  . Bipolar 1 disorder ([place]) 09/16/2015  . Palpitations 08/28/2014  . Hyperlipidemia LDL goal <70 11/26/2013  . History of breast cancer 11/16/2012  . Fibromyalgia 11/16/2012  . History of tobacco abuse 11/16/2012  . MRSA 04/13/2010  . INTERTRIGO 04/13/2010  . GROUP B STREPTOCOCCUS INFECTION, HX OF 04/13/2010  . HYPERLIPIDEMIA 09/02/2006  . DEPRESSION 09/02/2006  . Essential hypertension 09/02/2006  . COPD 09/02/2006  . GERD 09/02/2006    Past Surgical History:  Procedure Laterality Date  . ANKLE SURGERY     left x4  . BREAST SURGERY  2011   lumpectomy right breast   . COLONOSCOPY WITH PROPOFOL N/A 02/25/2014   Procedure: COLONOSCOPY WITH PROPOFOL;  Surgeon: Garlan Fair, MD;  Location: [facility];  Service: Endoscopy;  Laterality: N/A;  . deviated septum    . ECTOPIC PREGNANCY SURGERY    . ELBOW SURGERY     right elbow  . EYE SURGERY     cataract surgery  . fibroid     2-3 fibroid adenomas removed  . JOINT REPLACEMENT  2011   right knee   . KNEE SURGERY     left knee  . SHOULDER ARTHROSCOPY WITH BICEPSTENOTOMY Right 09/25/2015  Procedure: SHOULDER ARTHROSCOPY WITH BICEPSTENOTOMY;  Surgeon: Ninetta Lights, MD;  Location: [place];  Service: Orthopedics;  Laterality: Right;  . SHOULDER ARTHROSCOPY WITH DISTAL CLAVICLE RESECTION Right 09/25/2015   Procedure: SHOULDER ARTHROSCOPY WITH DISTAL CLAVICLE RESECTION;  Surgeon: Ninetta Lights, MD;  Location: [place];  Service: Orthopedics;  Laterality: Right;  . SHOULDER ARTHROSCOPY WITH SUBACROMIAL DECOMPRESSION Right 09/25/2015   Procedure: RIGHT SHOULDER ARTHROSCOPY DEBRIDEMENT,ACROMIOPLASTY,DISTAL CLAVICAL EXCISION, RELEASE OF BICEPS TENDON;   Surgeon: Ninetta Lights, MD;  Location: [place];  Service: Orthopedics;  Laterality: Right;  . TMJ ARTHROPLASTY    . TONSILLECTOMY    . TOTAL SHOULDER ARTHROPLASTY Left 01/05/2016  . UTERINE FIBROID SURGERY     polups and fibroids removed   . WRIST SURGERY     left    OB History    No data available       Home Medications    Prior to Admission medications   Medication Sig Start Date End Date Taking? Authorizing Provider  atenolol (TENORMIN) 50 MG tablet Take 50 mg by mouth 2 (two) times daily.    Historical Provider, MD  Biotin (BIOTIN 5000) 5 MG CAPS Take 5 mg by mouth daily.    Historical Provider, MD  cetirizine (ZYRTEC) 10 MG tablet Take 10 mg by mouth daily.     Historical Provider, MD  clindamycin (CLEOCIN) 150 MG capsule Take 3 capsules (450 mg total) by mouth 3 (three) times daily. 03/13/16   Nicole Pisciotta, PA-C  doxycycline (VIBRAMYCIN) 100 MG capsule Take 1 capsule (100 mg total) by mouth 2 (two) times daily. 03/13/16   Nicole Pisciotta, PA-C  doxycycline (VIBRAMYCIN) 100 MG capsule Take 1 capsule (100 mg total) by mouth 2 (two) times daily. 03/23/16   Sedalia Muta, FNP  fluticasone (FLONASE) 50 MCG/ACT nasal spray Place 1 spray into both nostrils 2 (two) times daily.     Historical Provider, MD  Fluticasone-Salmeterol (ADVAIR) 250-50 MCG/DOSE AEPB Inhale 1 puff into the lungs 2 (two) times daily.    Historical Provider, MD  HYDROcodone-acetaminophen (NORCO/VICODIN) 5-325 MG tablet Take 1-2 tablets by mouth every 6 hours as needed for pain and/or cough. 03/13/16   Nicole Pisciotta, PA-C  lamoTRIgine (LAMICTAL) 150 MG tablet Take 1 tablet (150 mg total) by mouth 2 (two) times daily. 02/19/16   Kathlee Nations, MD  Magnesium 250 MG TABS Take 250 mg by mouth daily.    Historical Provider, MD  oxyCODONE-acetaminophen (PERCOCET/ROXICET) 5-325 MG tablet Take 1 tablet by mouth every 4 (four) hours as needed for severe pain.     Historical Provider, MD    pantoprazole (PROTONIX) 40 MG tablet Take 40 mg by mouth daily.     Historical Provider, MD  traZODone (DESYREL) 100 MG tablet Take 2 tablets (200 mg total) by mouth at bedtime. 02/19/16   Kathlee Nations, MD  triamterene-hydrochlorothiazide (MAXZIDE-25) 37.5-25 MG tablet Take 1 tablet by mouth daily.    Historical Provider, MD  venlafaxine XR (EFFEXOR-XR) 150 MG 24 hr capsule Take 150 mg by mouth at bedtime.    Historical Provider, MD    Family History Family History  Problem Relation Age of Onset  . Mitral valve prolapse Mother   . Heart Problems Mother   . Cancer Mother     Social History Social History  Substance Use Topics  . Smoking status: Current Every Day Smoker    Packs/day: 1.50    Years: 40.00    Types:  Cigarettes  . Smokeless tobacco: Never Used     Comment: Cannot afford chantix. Working on getting discount from drug compny  . Alcohol use 0.0 oz/week     Comment: occ     Allergies   Contrast media [iodinated diagnostic agents]; Iodine; Adhesive [tape]; Amoxicillin-pot clavulanate; Ceclor [cefaclor]; Moxifloxacin; and Sulfonamide derivatives   Review of Systems Review of Systems  Constitutional: Negative for fever.  Skin: Positive for wound.     Physical Exam Updated Vital Signs BP 120/68 (BP Location: Right Arm)   Pulse 69   Temp 98.2 F (36.8 C) (Oral)   Resp 18   LMP 06/25/2011   SpO2 96%   Physical Exam  Constitutional: She is oriented to person, place, and time. She appears well-developed and well-nourished.  HENT:  Head: Normocephalic and atraumatic.  Cardiovascular: Normal rate.   Pulmonary/Chest: Effort normal.  Neurological: She is alert and oriented to person, place, and time.  Skin: Skin is warm and dry.  Cellulitis with induration to left posterior thigh. This is improved since my last evaluation. No fluctuance noted.   Psychiatric: She has a normal mood and affect.  Nursing note and vitals reviewed.    ED Treatments / Results   DIAGNOSTIC STUDIES:   COORDINATION OF CARE: 2:03 PM Discussed treatment plan with pt at bedside and pt agreed to plan.   Labs (all labs ordered are listed, but only abnormal results are displayed) Labs Reviewed  CBG MONITORING, ED    EKG  EKG Interpretation None       Radiology No results found.  Procedures Procedures (including critical care time)  EMERGENCY DEPARTMENT US SOFT TISSUE INTERPRETATION "Study: Limited Soft Tissue Ultrasound"  INDICATIONS: Soft tissue infection Multiple views of the body part were obtained in real-time with a multi-frequency linear probe  PERFORMED BY: Myself IMAGES ARCHIVED?: Yes SIDE:Left BODY PART:Lower extremity INTERPRETATION:  No abcess noted and Cellulitis present       Medications Ordered in ED Medications  rabies vaccine (RABAVERT) injection 1 mL (1 mL Intramuscular Given 03/27/16 1428)     Initial Impression / Assessment and Plan / ED Course  I have reviewed the triage vital signs and the nursing notes.  Pertinent labs & imaging results that were available during my care of the patient were reviewed by me and considered in my medical decision making (see chart for details).     56 year old female presents today for rabies vaccination.  She continues to have cellulitis at the bite site.  This is improved from my previous evaluation.  Patient was started on doxycycline as her clindamycin had completed.  Bedside ultrasound shows no drainable abscess.  Patient will be continued on Doxy with close follow-up with her primary care provider.  She is given strict return precautions, she verbalized understanding and agreement to today's plan and had no further questions or concerns at the time of discharge  Final Clinical Impressions(s) / ED Diagnoses   Final diagnoses:  Cellulitis of left lower extremity    New Prescriptions Discharge Medication List as of 03/27/2016  2:39 PM     I personally performed the services  described in this documentation, which was scribed in my presence. The recorded information has been reviewed and is accurate.   Okey Regal, PA-C 03/27/16 2021    Drenda Freeze, MD 03/27/16 [phone]

## 2016-03-27 NOTE — ED Triage Notes (Signed)
CBG 89 

## 2016-03-27 NOTE — ED Triage Notes (Signed)
Pt need final rabies vaccine. Swelling on l/leg examined by PA

## 2016-03-27 NOTE — Discharge Instructions (Signed)
You continue to have cellulitis surrounding the bite.  There are no signs of drainable abscess at this time.  Please continue using the doxycycline.  If your symptoms begin to worsen again you need immediate evaluation.  I recommend that you follow-up with your primary care in approximately 3 days for reassessment.

## 2016-03-29 ENCOUNTER — Encounter: Payer: Self-pay | Admitting: Physical Therapy

## 2016-03-29 ENCOUNTER — Ambulatory Visit: Payer: Medicare HMO | Attending: Orthopedic Surgery | Admitting: Physical Therapy

## 2016-03-29 ENCOUNTER — Ambulatory Visit (INDEPENDENT_AMBULATORY_CARE_PROVIDER_SITE_OTHER): Payer: Medicare HMO | Admitting: Family Medicine

## 2016-03-29 VITALS — BP 104/70 | HR 74 | Temp 98.6°F | Resp 18 | Ht 63.0 in | Wt 170.8 lb

## 2016-03-29 DIAGNOSIS — M25611 Stiffness of right shoulder, not elsewhere classified: Secondary | ICD-10-CM | POA: Insufficient documentation

## 2016-03-29 DIAGNOSIS — M25511 Pain in right shoulder: Secondary | ICD-10-CM | POA: Diagnosis not present

## 2016-03-29 DIAGNOSIS — M6281 Muscle weakness (generalized): Secondary | ICD-10-CM

## 2016-03-29 DIAGNOSIS — M25512 Pain in left shoulder: Secondary | ICD-10-CM | POA: Insufficient documentation

## 2016-03-29 DIAGNOSIS — Z5189 Encounter for other specified aftercare: Secondary | ICD-10-CM

## 2016-03-29 DIAGNOSIS — M25612 Stiffness of left shoulder, not elsewhere classified: Secondary | ICD-10-CM | POA: Insufficient documentation

## 2016-03-29 NOTE — Therapy (Signed)
[facility] [address], [place], [place], [address] Phone: [phone]   Fax:  [phone]  Physical Therapy Treatment  Patient Details  Name: Anita Arroyo MRN: [id number] Date of Birth: 24-May-1960 Referring Provider: Kathryne Hitch, MD  Encounter Date: 03/29/2016      PT End of Session - 03/29/16 1103    Visit Number 17   Number of Visits 20   Date for PT Re-Evaluation 04/19/16   Authorization Time Period KX mod?   PT Start Time 1102   PT Stop Time 1155   PT Time Calculation (min) 53 min   Activity Tolerance Patient tolerated treatment well   Behavior During Therapy WFL for tasks assessed/performed      Past Medical History:  Diagnosis Date  . ADHD (attention deficit hyperactivity disorder)   . Arthritis   . Asthma   . Bipolar 1 disorder ([place])   . Bipolar 1 disorder ([place])   . Breast cancer ([place])   . Depression   . Dysrhythmia    palpitations  . Dysrhythmia    atrial fibrillation  . Fibromyalgia   . GERD (gastroesophageal reflux disease)   . Heart murmur   . History of stress test 04/28/2011   showed normal perfusion without scar or ischemia  . Hx of echocardiogram 04/28/2011   showed normal systolic function with mild diastolic dysfunction,she had trace MR but did not have frank mitral valve prolapse demonstrated. She had mild pulmonary hypertension with an estimated RV systolic pressure at 34 mm.  . Hyperlipemia   . Meniere disease   . MVP (mitral valve prolapse)   . Neuromuscular disorder ([place])    fibromyalgia    Past Surgical History:  Procedure Laterality Date  . ANKLE SURGERY     left x4  . BREAST SURGERY  2011   lumpectomy right breast   . COLONOSCOPY WITH PROPOFOL N/A 02/25/2014   Procedure: COLONOSCOPY WITH PROPOFOL;  Surgeon: Garlan Fair, MD;  [facility];  Service: Endoscopy;  Laterality: N/A;  . deviated septum    . ECTOPIC PREGNANCY SURGERY    . ELBOW SURGERY      right elbow  . EYE SURGERY     cataract surgery  . fibroid     2-3 fibroid adenomas removed  . JOINT REPLACEMENT  2011   right knee   . KNEE SURGERY     left knee  . SHOULDER ARTHROSCOPY WITH BICEPSTENOTOMY Right 09/25/2015   Procedure: SHOULDER ARTHROSCOPY WITH BICEPSTENOTOMY;  Surgeon: Ninetta Lights, MD;  Location: [place];  Service: Orthopedics;  Laterality: Right;  . SHOULDER ARTHROSCOPY WITH DISTAL CLAVICLE RESECTION Right 09/25/2015   Procedure: SHOULDER ARTHROSCOPY WITH DISTAL CLAVICLE RESECTION;  Surgeon: Ninetta Lights, MD;  Location: [place];  Service: Orthopedics;  Laterality: Right;  . SHOULDER ARTHROSCOPY WITH SUBACROMIAL DECOMPRESSION Right 09/25/2015   Procedure: RIGHT SHOULDER ARTHROSCOPY DEBRIDEMENT,ACROMIOPLASTY,DISTAL CLAVICAL EXCISION, RELEASE OF BICEPS TENDON;  Surgeon: Ninetta Lights, MD;  Location: [place];  Service: Orthopedics;  Laterality: Right;  . TMJ ARTHROPLASTY    . TONSILLECTOMY    . TOTAL SHOULDER ARTHROPLASTY Left 01/05/2016  . UTERINE FIBROID SURGERY     polups and fibroids removed   . WRIST SURGERY     left    There were no vitals filed for this visit.      Subjective Assessment - 03/29/16 1105    Subjective I had a reaction to my antibiotics last  week which is why I missed PT.  Her Rt shoulder is bothering her more from taking care of a a tough dog last week in addition.             [facility] PT Assessment - 03/29/16 0001      AROM   Right Shoulder Flexion 130 Degrees   Right Shoulder ABduction 115 Degrees   Right Shoulder Internal Rotation --  LT shoulder IR behind the back reaches lumbar spine   Left Shoulder Flexion 130 Degrees  LT shoulder Ext RT:Can freely touch base of neck,      Strength   Overall Strength --  RT Shoulder: Grossly 4-4+/5: LT shoulder: Flexion 4/5,   Overall Strength Comments LT shld: Abduction 3+/5, ER 4-/5 IR 3+/5                     [facility]  Adult PT Treatment/Exercise - 03/29/16 0001      Shoulder Exercises: Supine   Flexion AAROM;Both;10 reps  Cane over head     Shoulder Exercises: Sidelying   External Rotation Strengthening;Left;10 reps;Weights   External Rotation Weight (lbs) 1   ABduction Strengthening;Left;5 reps;Weights  pt completed 2x5   ABduction Weight (lbs) 1     Shoulder Exercises: Standing   Flexion AAROM;Strengthening;Left;10 reps  2# flexion on finger ladder 10x   Extension Strengthening;Both;20 reps;Theraband   Theraband Level (Shoulder Extension) Level 2 (Red)   Row Strengthening;Both;20 reps;Theraband   Theraband Level (Shoulder Row) Level 2 (Red)     Shoulder Exercises: Pulleys   Flexion 3 minutes   ABduction --  scaption 3 min     Moist Heat Therapy   Number Minutes Moist Heat 15 Minutes   Moist Heat Location --  Bil shoulders                  PT Short Term Goals - 03/08/16 1449      PT SHORT TERM GOAL #1   Title be independent in initial HEP   Time 4   Period Weeks   Status Achieved     PT SHORT TERM GOAL #2   Title demonstrate Rt shoulder AROM flexion to > or = to 120 degrees to improve reacing overhead   Time 4   Period Weeks   Status Achieved     PT SHORT TERM GOAL #3   Title demonstrate Lt shoulder PROM flexion to 90 degrees to improve mobility and allow for use as able   Time 4   Period Weeks   Status Achieved     PT SHORT TERM GOAL #4   Title demonstrate 4+/5 Rt shoulder strength to improve use   Time 4   Period Days   Status On-going           PT Long Term Goals - 01/28/16 1507      PT LONG TERM GOAL #1   Title be independent in advanced HEP   Time 8   Period Weeks   Status On-going     PT LONG TERM GOAL #2   Title reduce FOTO to < or = to 55% limitation   Time 8   Period Weeks   Status On-going     PT LONG TERM GOAL #3   Title demonstrate Lt shoulder P/ROM flexion to > or = to 110 degrees to improve use   Time 8   Period Weeks    Status On-going     PT LONG TERM GOAL #4  Title demonstrate Lt shoulder P/ROM IR/ER to > or = to 45 degrees to improve use   Time 8   Period Days   Status On-going     PT LONG TERM GOAL #5   Title demonstrate Rt shoulder IR to L2 to improve use with self-care   Time 8   Period Weeks   Status On-going     PT LONG TERM GOAL #6   Title report < or = to 4/10 Rt and Lt shoulder pain with daily use   Time 8   Status On-going               Plan - 03/29/16 1106    Clinical Impression Statement AROM significantly improved in both shoulders as well as her strength. Continues to improver her Lt shoulder IR/ER but remains weak post shoulder replacement. Pt missed a week of PT due to complications from antibiotics after being bitten by a cat.    Rehab Potential Good   PT Frequency 2x / week   PT Duration 6 weeks   PT Treatment/Interventions ADLs/Self Care Home Management;Electrical Stimulation;Cryotherapy;Functional mobility training;Moist Heat;Therapeutic activities;Therapeutic exercise;Neuromuscular re-education;Patient/family education;Passive range of motion;Scar mobilization;Manual techniques;Vasopneumatic Device;Taping   PT Next Visit Plan Rt shoulder A/ROM and strength progression as tolerated, follow post-op protocol for Lt shoulder.   Consulted and Agree with Plan of Care --      Patient will benefit from skilled therapeutic intervention in order to improve the following deficits and impairments:  Decreased range of motion, Pain, Increased muscle spasms, Decreased endurance, Decreased activity tolerance, Postural dysfunction, Increased edema, Decreased strength, Impaired flexibility, Decreased scar mobility, Impaired UE functional use  Visit Diagnosis: Acute pain of left shoulder  Stiffness of left shoulder, not elsewhere classified  Muscle weakness (generalized)  Stiffness of right shoulder, not elsewhere classified  Acute pain of right shoulder     Problem  List Patient Active Problem List   Diagnosis Date Noted  . History of palpitations 09/16/2015  . Pre-operative cardiovascular examination 09/16/2015  . Bipolar 1 disorder ([place]) 09/16/2015  . Palpitations 08/28/2014  . Hyperlipidemia LDL goal <70 11/26/2013  . History of breast cancer 11/16/2012  . Fibromyalgia 11/16/2012  . History of tobacco abuse 11/16/2012  . MRSA 04/13/2010  . INTERTRIGO 04/13/2010  . GROUP B STREPTOCOCCUS INFECTION, HX OF 04/13/2010  . HYPERLIPIDEMIA 09/02/2006  . DEPRESSION 09/02/2006  . Essential hypertension 09/02/2006  . COPD 09/02/2006  . GERD 09/02/2006    COCHRAN,JENNIFER, PTA 03/29/2016, 11:42 AM  [facility] [address], [place], [place], [address] Phone: [phone]   Fax:  [phone]  Name: Anita Arroyo MRN: [id number] Date of Birth: 1960/10/23

## 2016-03-29 NOTE — Progress Notes (Signed)
     Wound Recheck  56 year old female presents today for a wound recheck following a cat bite that occurred on 03/13/2016. Bite subsequently, became infected. She presented at PCP on 03/23/2016 with signs of worsening cellulitis. I attempted to I&D the wound x 2 without success of draining. I placed her on an extended coarse of Doxycycline due to the wound was remarkable for infection.  She presents today for wound recheck. Left lower leg is mildly erythematous without edema. The area of erythematous has significantly decreased and is localized to the site  of the initial wound.   Plan: Complete the remainder of antibiotic. No additional follow-up indicated.  Carroll Sage. Kenton Kingfisher, MSN, FNP-C [facility] [place]

## 2016-03-29 NOTE — Patient Instructions (Signed)
     IF you received an x-ray today, you will receive an invoice from [facility]. Please contact [facility] at [phone] with questions or concerns regarding your invoice.   IF you received labwork today, you will receive an invoice from [facility]. Please contact [facility] at [phone] with questions or concerns regarding your invoice.   Our billing staff will not be able to assist you with questions regarding bills from these companies.  You will be contacted with the lab results as soon as they are available. The fastest way to get your results is to activate your My Chart account. Instructions are located on the last page of this paperwork. If you have not heard from us regarding the results in 2 weeks, please contact this office.     

## 2016-03-30 DIAGNOSIS — M19012 Primary osteoarthritis, left shoulder: Secondary | ICD-10-CM | POA: Diagnosis not present

## 2016-03-31 ENCOUNTER — Other Ambulatory Visit: Payer: Self-pay

## 2016-03-31 ENCOUNTER — Encounter: Payer: Self-pay | Admitting: Physical Therapy

## 2016-03-31 ENCOUNTER — Ambulatory Visit: Payer: Medicare HMO | Admitting: Physical Therapy

## 2016-03-31 DIAGNOSIS — M6281 Muscle weakness (generalized): Secondary | ICD-10-CM | POA: Diagnosis not present

## 2016-03-31 DIAGNOSIS — M25612 Stiffness of left shoulder, not elsewhere classified: Secondary | ICD-10-CM

## 2016-03-31 DIAGNOSIS — M25512 Pain in left shoulder: Secondary | ICD-10-CM | POA: Diagnosis not present

## 2016-03-31 DIAGNOSIS — M25611 Stiffness of right shoulder, not elsewhere classified: Secondary | ICD-10-CM | POA: Diagnosis not present

## 2016-03-31 DIAGNOSIS — M25511 Pain in right shoulder: Secondary | ICD-10-CM | POA: Diagnosis not present

## 2016-03-31 NOTE — Therapy (Signed)
[facility] [address] [place], [place], [address] Phone: [phone]   Fax:  [phone]  Physical Therapy Treatment  Patient Details  Name: Anita Arroyo MRN: [id number] Date of Birth: [birth year] Referring Provider: Kathryne Hitch, MD  Encounter Date: 03/31/2016      PT End of Session - 03/31/16 1103    Visit Number 18   Number of Visits 20   Date for PT Re-Evaluation 04/19/16   Authorization Time Period KX mod?   PT Start Time 1100   PT Stop Time 1150   PT Time Calculation (min) 50 min   Activity Tolerance Patient tolerated treatment well   Behavior During Therapy WFL for tasks assessed/performed      Past Medical History:  Diagnosis Date  . ADHD (attention deficit hyperactivity disorder)   . Arthritis   . Asthma   . Bipolar 1 disorder ([place])   . Bipolar 1 disorder ([place])   . Breast cancer ([place])   . Depression   . Dysrhythmia    palpitations  . Dysrhythmia    atrial fibrillation  . Fibromyalgia   . GERD (gastroesophageal reflux disease)   . Heart murmur   . History of stress test 04/28/2011   showed normal perfusion without scar or ischemia  . Hx of echocardiogram 04/28/2011   showed normal systolic function with mild diastolic dysfunction,she had trace MR but did not have frank mitral valve prolapse demonstrated. She had mild pulmonary hypertension with an estimated RV systolic pressure at 34 mm.  . Hyperlipemia   . Meniere disease   . MVP (mitral valve prolapse)   . Neuromuscular disorder ([place])    fibromyalgia    Past Surgical History:  Procedure Laterality Date  . ANKLE SURGERY     left x4  . BREAST SURGERY  2011   lumpectomy right breast   . COLONOSCOPY WITH PROPOFOL N/A 02/25/2014   Procedure: COLONOSCOPY WITH PROPOFOL;  Surgeon: Garlan Fair, MD;  Location: [facility];  Service: Endoscopy;  Laterality: N/A;  . deviated septum    . ECTOPIC PREGNANCY SURGERY    . ELBOW SURGERY      right elbow  . EYE SURGERY     cataract surgery  . fibroid     2-3 fibroid adenomas removed  . JOINT REPLACEMENT  2011   right knee   . KNEE SURGERY     left knee  . SHOULDER ARTHROSCOPY WITH BICEPSTENOTOMY Right 09/25/2015   Procedure: SHOULDER ARTHROSCOPY WITH BICEPSTENOTOMY;  Surgeon: Ninetta Lights, MD;  Location: [facility];  Service: Orthopedics;  Laterality: Right;  . SHOULDER ARTHROSCOPY WITH DISTAL CLAVICLE RESECTION Right 09/25/2015   Procedure: SHOULDER ARTHROSCOPY WITH DISTAL CLAVICLE RESECTION;  Surgeon: Ninetta Lights, MD;  Location: [facility];  Service: Orthopedics;  Laterality: Right;  . SHOULDER ARTHROSCOPY WITH SUBACROMIAL DECOMPRESSION Right 09/25/2015   Procedure: RIGHT SHOULDER ARTHROSCOPY DEBRIDEMENT,ACROMIOPLASTY,DISTAL CLAVICAL EXCISION, RELEASE OF BICEPS TENDON;  Surgeon: Ninetta Lights, MD;  Location: [facility];  Service: Orthopedics;  Laterality: Right;  . TMJ ARTHROPLASTY    . TONSILLECTOMY    . TOTAL SHOULDER ARTHROPLASTY Left 01/05/2016  . UTERINE FIBROID SURGERY     polups and fibroids removed   . WRIST SURGERY     left    There were no vitals filed for this visit.      Subjective Assessment - 03/31/16 1100    Subjective Saw MD yesterday and got released. She will  complete her Pt through the rest of the month then discharge.    Currently in Pain? --  Only when stretching her arms at her end range.                          [facility] Adult PT Treatment/Exercise - 03/31/16 0001      Shoulder Exercises: Sidelying   External Rotation Strengthening;Left;20 reps;Weights   External Rotation Weight (lbs) 1   ABduction Strengthening;Left;20 reps;Weights   ABduction Weight (lbs) 1     Shoulder Exercises: Standing   Flexion --  Bil ladder 10x 1 1/2 #    Extension Strengthening;Both;20 reps;Theraband   Theraband Level (Shoulder Extension) Level 2 (Red)   Row Strengthening;Both;20  reps;Theraband   Theraband Level (Shoulder Row) Level 2 (Red)   Other Standing Exercises second shelf 2x10: 1# Lt 2# RT     Shoulder Exercises: Pulleys   Flexion 3 minutes   ABduction --  scaption 3 min     Moist Heat Therapy   Number Minutes Moist Heat 10 Minutes   Moist Heat Location --  Bil shoulders                  PT Short Term Goals - 03/08/16 1449      PT SHORT TERM GOAL #1   Title be independent in initial HEP   Time 4   Period Weeks   Status Achieved     PT SHORT TERM GOAL #2   Title demonstrate Rt shoulder AROM flexion to > or = to 120 degrees to improve reacing overhead   Time 4   Period Weeks   Status Achieved     PT SHORT TERM GOAL #3   Title demonstrate Lt shoulder PROM flexion to 90 degrees to improve mobility and allow for use as able   Time 4   Period Weeks   Status Achieved     PT SHORT TERM GOAL #4   Title demonstrate 4+/5 Rt shoulder strength to improve use   Time 4   Period Days   Status On-going           PT Long Term Goals - 01/28/16 1507      PT LONG TERM GOAL #1   Title be independent in advanced HEP   Time 8   Period Weeks   Status On-going     PT LONG TERM GOAL #2   Title reduce FOTO to < or = to 55% limitation   Time 8   Period Weeks   Status On-going     PT LONG TERM GOAL #3   Title demonstrate Lt shoulder P/ROM flexion to > or = to 110 degrees to improve use   Time 8   Period Weeks   Status On-going     PT LONG TERM GOAL #4   Title demonstrate Lt shoulder P/ROM IR/ER to > or = to 45 degrees to improve use   Time 8   Period Days   Status On-going     PT LONG TERM GOAL #5   Title demonstrate Rt shoulder IR to L2 to improve use with self-care   Time 8   Period Weeks   Status On-going     PT LONG TERM GOAL #6   Title report < or = to 4/10 Rt and Lt shoulder pain with daily use   Time 8   Status On-going  Plan - 03/31/16 1103    Clinical Impression Statement Pt continues to  tolerate progressive resistive exercises for bil shoulder strength. She was able to perform more work with greater loads today.    Rehab Potential Good   PT Frequency 2x / week   PT Duration 6 weeks   PT Treatment/Interventions ADLs/Self Care Home Management;Electrical Stimulation;Cryotherapy;Functional mobility training;Moist Heat;Therapeutic activities;Therapeutic exercise;Neuromuscular re-education;Patient/family education;Passive range of motion;Scar mobilization;Manual techniques;Vasopneumatic Device;Taping   PT Next Visit Plan Rt shoulder A/ROM and strength progression as tolerated, follow post-op protocol for Lt shoulder.   Consulted and Agree with Plan of Care Patient      Patient will benefit from skilled therapeutic intervention in order to improve the following deficits and impairments:  Decreased range of motion, Pain, Increased muscle spasms, Decreased endurance, Decreased activity tolerance, Postural dysfunction, Increased edema, Decreased strength, Impaired flexibility, Decreased scar mobility, Impaired UE functional use  Visit Diagnosis: Acute pain of left shoulder  Stiffness of left shoulder, not elsewhere classified  Muscle weakness (generalized)  Stiffness of right shoulder, not elsewhere classified     Problem List Patient Active Problem List   Diagnosis Date Noted  . History of palpitations 09/16/2015  . Pre-operative cardiovascular examination 09/16/2015  . Bipolar 1 disorder ([place]) 09/16/2015  . Palpitations 08/28/2014  . Hyperlipidemia LDL goal <70 11/26/2013  . History of breast cancer 11/16/2012  . Fibromyalgia 11/16/2012  . History of tobacco abuse 11/16/2012  . MRSA 04/13/2010  . INTERTRIGO 04/13/2010  . GROUP B STREPTOCOCCUS INFECTION, HX OF 04/13/2010  . HYPERLIPIDEMIA 09/02/2006  . DEPRESSION 09/02/2006  . Essential hypertension 09/02/2006  . COPD 09/02/2006  . GERD 09/02/2006    COCHRAN,JENNIFER, PTA  03/31/2016, 11:38 AM  [facility] [address] [place], [place], [address] Phone: [phone]   Fax:  [phone]  Name: Anita Arroyo MRN: [id number] Date of Birth: 1960/07/14

## 2016-03-31 NOTE — Patient Outreach (Signed)
[facility]) Care Management  03/31/2016  Anita Arroyo 05/12/1960 [id number]   Telephone call to patient for referral from ED Utilization.  No answer.  HIPAA compliant voice message left    Plan: [facility] will attempt patient again within 10 business days.    Jone Baseman, RN, MSN [place] [phone]

## 2016-04-02 ENCOUNTER — Other Ambulatory Visit: Payer: Self-pay

## 2016-04-02 NOTE — Patient Outreach (Signed)
[facility]) Care Management  04/02/2016  REBEKAH ZACKERY 11-Nov-1960 [id number]   2nd Telephone call to patient for high ED utilization referral.  No answer.  HIPAA compliant voice message left.    Plan: RN Health Coach will attempt patient again in 10 business days.  Jone Baseman, RN, MSN [place] [phone]

## 2016-04-05 ENCOUNTER — Ambulatory Visit: Payer: Medicare HMO | Admitting: Physical Therapy

## 2016-04-05 ENCOUNTER — Other Ambulatory Visit: Payer: Self-pay

## 2016-04-05 ENCOUNTER — Ambulatory Visit: Admission: RE | Admit: 2016-04-05 | Payer: Self-pay | Source: Ambulatory Visit

## 2016-04-05 ENCOUNTER — Encounter: Payer: Self-pay | Admitting: Physical Therapy

## 2016-04-05 DIAGNOSIS — M25612 Stiffness of left shoulder, not elsewhere classified: Secondary | ICD-10-CM | POA: Diagnosis not present

## 2016-04-05 DIAGNOSIS — M25512 Pain in left shoulder: Secondary | ICD-10-CM

## 2016-04-05 DIAGNOSIS — M25511 Pain in right shoulder: Secondary | ICD-10-CM | POA: Diagnosis not present

## 2016-04-05 DIAGNOSIS — M25611 Stiffness of right shoulder, not elsewhere classified: Secondary | ICD-10-CM | POA: Diagnosis not present

## 2016-04-05 DIAGNOSIS — M6281 Muscle weakness (generalized): Secondary | ICD-10-CM | POA: Diagnosis not present

## 2016-04-05 NOTE — Therapy (Signed)
[facility] [address] [place], [place], [address] Phone: [phone]   Fax:  [phone]  Physical Therapy Treatment  Patient Details  Name: Anita Arroyo MRN: [id number] Date of Birth: 02-23-1960 Referring Provider: Mckinley Jewel, MD  Encounter Date: 04/05/2016      PT End of Session - 04/05/16 1101    Visit Number 19   Number of Visits 20   Date for PT Re-Evaluation 04/19/16   Authorization Time Period KX mod   PT Start Time 1100   PT Stop Time 1200   PT Time Calculation (min) 60 min   Activity Tolerance Patient tolerated treatment well   Behavior During Therapy [facility] for tasks assessed/performed      Past Medical History:  Diagnosis Date  . ADHD (attention deficit hyperactivity disorder)   . Arthritis   . Asthma   . Bipolar 1 disorder ([facility])   . Bipolar 1 disorder ([facility])   . Breast cancer ([facility])   . Depression   . Dysrhythmia    palpitations  . Dysrhythmia    atrial fibrillation  . Fibromyalgia   . GERD (gastroesophageal reflux disease)   . Heart murmur   . History of stress test 04/28/2011   showed normal perfusion without scar or ischemia  . Hx of echocardiogram 04/28/2011   showed normal systolic function with mild diastolic dysfunction,she had trace MR but did not have frank mitral valve prolapse demonstrated. She had mild pulmonary hypertension with an estimated RV systolic pressure at 34 mm.  . Hyperlipemia   . Meniere disease   . MVP (mitral valve prolapse)   . Neuromuscular disorder ([facility])    fibromyalgia    Past Surgical History:  Procedure Laterality Date  . ANKLE SURGERY     left x4  . BREAST SURGERY  2011   lumpectomy right breast   . COLONOSCOPY WITH PROPOFOL N/A 02/25/2014   Procedure: COLONOSCOPY WITH PROPOFOL;  Surgeon: Charolett Bumpers, MD;  [facility]: [facility];  Service: Endoscopy;  Laterality: N/A;  . deviated septum    . ECTOPIC PREGNANCY SURGERY    . ELBOW SURGERY     right elbow  . EYE SURGERY     cataract surgery  . fibroid     2-3 fibroid adenomas removed  . JOINT REPLACEMENT  2011   right knee   . KNEE SURGERY     left knee  . SHOULDER ARTHROSCOPY WITH BICEPSTENOTOMY Right 09/25/2015   Procedure: SHOULDER ARTHROSCOPY WITH BICEPSTENOTOMY;  Surgeon: Loreta Ave, MD;  Location: [facility];  Service: Orthopedics;  Laterality: Right;  . SHOULDER ARTHROSCOPY WITH DISTAL CLAVICLE RESECTION Right 09/25/2015   Procedure: SHOULDER ARTHROSCOPY WITH DISTAL CLAVICLE RESECTION;  Surgeon: Loreta Ave, MD;  Location: [facility];  Service: Orthopedics;  Laterality: Right;  . SHOULDER ARTHROSCOPY WITH SUBACROMIAL DECOMPRESSION Right 09/25/2015   Procedure: RIGHT SHOULDER ARTHROSCOPY DEBRIDEMENT,ACROMIOPLASTY,DISTAL CLAVICAL EXCISION, RELEASE OF BICEPS TENDON;  Surgeon: Loreta Ave, MD;  Location: [facility];  Service: Orthopedics;  Laterality: Right;  . TMJ ARTHROPLASTY    . TONSILLECTOMY    . TOTAL SHOULDER ARTHROPLASTY Left 01/05/2016  . UTERINE FIBROID SURGERY     polups and fibroids removed   . WRIST SURGERY     left    There were no vitals filed for this visit.      Subjective Assessment - 04/05/16 1103    Subjective I was pretty sore after last session Lt >  RT and most of it was muscle.    Currently in Pain? Yes   Pain Score --  RT 3/10, Lt 4/10 sore   Aggravating Factors  Over using it   Pain Relieving Factors Meds, heat, massage   Multiple Pain Sites No                         [facility] Adult PT Treatment/Exercise - 04/05/16 0001      Shoulder Exercises: Sidelying   External Rotation Strengthening;Left;20 reps;Weights   External Rotation Weight (lbs) 1   ABduction Strengthening;Left;20 reps;Weights   ABduction Weight (lbs) 1     Shoulder Exercises: Standing   Flexion --  Bil ladder 10x 1 1/2 # LT 2# RT   Extension Strengthening;Both;20 reps;Theraband   Theraband Level  (Shoulder Extension) Level 2 (Red)   Row Strengthening;Both;20 reps;Theraband   Theraband Level (Shoulder Row) Level 2 (Red)   Other Standing Exercises second shelf 2x10: 1# Lt 2# RT     Moist Heat Therapy   Number Minutes Moist Heat 15 Minutes   Moist Heat Location --  Bil shoulders     Manual Therapy   Soft tissue mobilization Lt pectorals and RTC                  PT Short Term Goals - 03/08/16 1449      PT SHORT TERM GOAL #1   Title be independent in initial HEP   Time 4   Period Weeks   Status Achieved     PT SHORT TERM GOAL #2   Title demonstrate Rt shoulder AROM flexion to > or = to 120 degrees to improve reacing overhead   Time 4   Period Weeks   Status Achieved     PT SHORT TERM GOAL #3   Title demonstrate Lt shoulder PROM flexion to 90 degrees to improve mobility and allow for use as able   Time 4   Period Weeks   Status Achieved     PT SHORT TERM GOAL #4   Title demonstrate 4+/5 Rt shoulder strength to improve use   Time 4   Period Days   Status On-going           PT Long Term Goals - 01/28/16 1507      PT LONG TERM GOAL #1   Title be independent in advanced HEP   Time 8   Period Weeks   Status On-going     PT LONG TERM GOAL #2   Title reduce FOTO to < or = to 55% limitation   Time 8   Period Weeks   Status On-going     PT LONG TERM GOAL #3   Title demonstrate Lt shoulder P/ROM flexion to > or = to 110 degrees to improve use   Time 8   Period Weeks   Status On-going     PT LONG TERM GOAL #4   Title demonstrate Lt shoulder P/ROM IR/ER to > or = to 45 degrees to improve use   Time 8   Period Days   Status On-going     PT LONG TERM GOAL #5   Title demonstrate Rt shoulder IR to L2 to improve use with self-care   Time 8   Period Weeks   Status On-going     PT LONG TERM GOAL #6   Title report < or = to 4/10 Rt and Lt shoulder pain with daily use   Time  8   Status On-going               Plan - 04/05/16 1101     Clinical Impression Statement Kept weights the same today as they are currently challenging to pt. Most difficulty is sidelying abduction, muscles visably tremble.    Rehab Potential Good   PT Frequency 2x / week   PT Duration 6 weeks   PT Treatment/Interventions ADLs/Self Care Home Management;Electrical Stimulation;Cryotherapy;Functional mobility training;Moist Heat;Therapeutic activities;Therapeutic exercise;Neuromuscular re-education;Patient/family education;Passive range of motion;Scar mobilization;Manual techniques;Vasopneumatic Device;Taping   PT Next Visit Plan G code FOTO next, Bil shoulder AROm and strength   Consulted and Agree with Plan of Care Patient      Patient will benefit from skilled therapeutic intervention in order to improve the following deficits and impairments:  Decreased range of motion, Pain, Increased muscle spasms, Decreased endurance, Decreased activity tolerance, Postural dysfunction, Increased edema, Decreased strength, Impaired flexibility, Decreased scar mobility, Impaired UE functional use  Visit Diagnosis: Stiffness of left shoulder, not elsewhere classified  Acute pain of left shoulder  Muscle weakness (generalized)     Problem List Patient Active Problem List   Diagnosis Date Noted  . History of palpitations 09/16/2015  . Pre-operative cardiovascular examination 09/16/2015  . Bipolar 1 disorder ([facility]) 09/16/2015  . Palpitations 08/28/2014  . Hyperlipidemia LDL goal <70 11/26/2013  . History of breast cancer 11/16/2012  . Fibromyalgia 11/16/2012  . History of tobacco abuse 11/16/2012  . MRSA 04/13/2010  . INTERTRIGO 04/13/2010  . GROUP B STREPTOCOCCUS INFECTION, HX OF 04/13/2010  . HYPERLIPIDEMIA 09/02/2006  . DEPRESSION 09/02/2006  . Essential hypertension 09/02/2006  . COPD 09/02/2006  . GERD 09/02/2006    Deundre Thong, PTA 04/05/2016, 11:38 AM  [facility] [address]  [address] [place], [place], [address] Phone: [phone]   Fax:  [phone]  Name: YETZALI WEY MRN: [id number] Date of Birth: 1960-06-22

## 2016-04-05 NOTE — Patient Outreach (Signed)
[facility]) Care Management  04/05/2016  Anita Arroyo 1960/05/26 [id number]   3rd telephone call to patient for ED Utilization screen.  No answer.  HIPAA compliant voice message left.    Plan: RN Health Coach will send letter to attempt outreach.  Jone Baseman, RN, MSN [place] [phone]

## 2016-04-07 ENCOUNTER — Ambulatory Visit: Payer: Medicare HMO | Admitting: Physical Therapy

## 2016-04-07 ENCOUNTER — Encounter: Payer: Self-pay | Admitting: Physical Therapy

## 2016-04-07 DIAGNOSIS — M6281 Muscle weakness (generalized): Secondary | ICD-10-CM | POA: Diagnosis not present

## 2016-04-07 DIAGNOSIS — M25511 Pain in right shoulder: Secondary | ICD-10-CM

## 2016-04-07 DIAGNOSIS — M25611 Stiffness of right shoulder, not elsewhere classified: Secondary | ICD-10-CM

## 2016-04-07 DIAGNOSIS — M25512 Pain in left shoulder: Secondary | ICD-10-CM | POA: Diagnosis not present

## 2016-04-07 DIAGNOSIS — M25612 Stiffness of left shoulder, not elsewhere classified: Secondary | ICD-10-CM

## 2016-04-07 NOTE — Therapy (Signed)
[facility] [address] [place], [place], [address] Phone: [phone]   Fax:  [phone]  Physical Therapy Treatment  Patient Details  Name: Anita Arroyo MRN: [id number] Date of Birth: [birth year] Referring Provider: Kathryne Hitch, MD  Encounter Date: 04/07/2016      PT End of Session - 04/07/16 1103    Visit Number 20   Number of Visits 20   Date for PT Re-Evaluation 04/19/16   Authorization Time Period KX mod   PT Start Time 1103   PT Stop Time 1200   PT Time Calculation (min) 57 min   Activity Tolerance Patient tolerated treatment well   Behavior During Therapy [facility] for tasks assessed/performed      Past Medical History:  Diagnosis Date  . ADHD (attention deficit hyperactivity disorder)   . Arthritis   . Asthma   . Bipolar 1 disorder ([place])   . Bipolar 1 disorder ([place])   . Breast cancer ([place])   . Depression   . Dysrhythmia    palpitations  . Dysrhythmia    atrial fibrillation  . Fibromyalgia   . GERD (gastroesophageal reflux disease)   . Heart murmur   . History of stress test 04/28/2011   showed normal perfusion without scar or ischemia  . Hx of echocardiogram 04/28/2011   showed normal systolic function with mild diastolic dysfunction,she had trace MR but did not have frank mitral valve prolapse demonstrated. She had mild pulmonary hypertension with an estimated RV systolic pressure at 34 mm.  . Hyperlipemia   . Meniere disease   . MVP (mitral valve prolapse)   . Neuromuscular disorder ([place])    fibromyalgia    Past Surgical History:  Procedure Laterality Date  . ANKLE SURGERY     left x4  . BREAST SURGERY  2011   lumpectomy right breast   . COLONOSCOPY WITH PROPOFOL N/A 02/25/2014   Procedure: COLONOSCOPY WITH PROPOFOL;  Surgeon: Garlan Fair, MD;  Location: [facility];  Service: Endoscopy;  Laterality: N/A;  . deviated septum    . ECTOPIC PREGNANCY SURGERY    . ELBOW SURGERY     right elbow  . EYE SURGERY     cataract surgery  . fibroid     2-3 fibroid adenomas removed  . JOINT REPLACEMENT  2011   right knee   . KNEE SURGERY     left knee  . SHOULDER ARTHROSCOPY WITH BICEPSTENOTOMY Right 09/25/2015   Procedure: SHOULDER ARTHROSCOPY WITH BICEPSTENOTOMY;  Surgeon: Ninetta Lights, MD;  Location: [facility];  Service: Orthopedics;  Laterality: Right;  . SHOULDER ARTHROSCOPY WITH DISTAL CLAVICLE RESECTION Right 09/25/2015   Procedure: SHOULDER ARTHROSCOPY WITH DISTAL CLAVICLE RESECTION;  Surgeon: Ninetta Lights, MD;  Location: [facility];  Service: Orthopedics;  Laterality: Right;  . SHOULDER ARTHROSCOPY WITH SUBACROMIAL DECOMPRESSION Right 09/25/2015   Procedure: RIGHT SHOULDER ARTHROSCOPY DEBRIDEMENT,ACROMIOPLASTY,DISTAL CLAVICAL EXCISION, RELEASE OF BICEPS TENDON;  Surgeon: Ninetta Lights, MD;  Location: [facility];  Service: Orthopedics;  Laterality: Right;  . TMJ ARTHROPLASTY    . TONSILLECTOMY    . TOTAL SHOULDER ARTHROPLASTY Left 01/05/2016  . UTERINE FIBROID SURGERY     polups and fibroids removed   . WRIST SURGERY     left    There were no vitals filed for this visit.      Subjective Assessment - 04/07/16 1104    Subjective Was not as sore as I was last the  prior session.    Currently in Pain? No/denies   Multiple Pain Sites No            [facility] PT Assessment - 04/07/16 0001      Observation/Other Assessments   Focus on Therapeutic Outcomes (FOTO)  41% Ck all three categories     AROM   Right Shoulder Flexion 145 Degrees   Right Shoulder ABduction 115 Degrees   Right Shoulder Internal Rotation --  Reaches to upper lumbar   Left Shoulder Flexion 145 Degrees                     [facility] Adult PT Treatment/Exercise - 04/07/16 0001      Shoulder Exercises: Supine   ABduction AROM;Strengthening;Both;20 reps     Shoulder Exercises: Sidelying   External Rotation Strengthening;Left;20  reps;Weights   External Rotation Weight (lbs) 1   ABduction Strengthening;Left;20 reps;Weights   ABduction Weight (lbs) 1     Shoulder Exercises: Standing   External Rotation Strengthening;Right;20 reps;Theraband  10x LT, VC for form   Theraband Level (Shoulder External Rotation) Level 2 (Red)   Flexion --  Bil ladder 10x 1 1/2 # LT 2# RT   ABduction --  Attempted against wall, too painful, shifted to supine   Extension Strengthening;Both;20 reps;Theraband   Theraband Level (Shoulder Extension) Level 2 (Red)   Row Strengthening;Both;20 reps;Theraband   Theraband Level (Shoulder Row) Level 2 (Red)   Other Standing Exercises second shelf 2x10: 1# Lt 2# RT  2nd set pt did 2# on LT     Shoulder Exercises: Pulleys   Flexion 3 minutes   ABduction --  scaption 3 min     Moist Heat Therapy   Number Minutes Moist Heat 15 Minutes   Moist Heat Location --  Bil shoulders                PT Education - 04/07/16 1132    Education provided Yes   Education Details Supine abduction   Person(s) Educated Patient   Methods Explanation;Demonstration;Tactile cues;Verbal cues;Handout   Comprehension Verbalized understanding;Returned demonstration          PT Short Term Goals - 03/08/16 1449      PT SHORT TERM GOAL #1   Title be independent in initial HEP   Time 4   Period Weeks   Status Achieved     PT SHORT TERM GOAL #2   Title demonstrate Rt shoulder AROM flexion to > or = to 120 degrees to improve reacing overhead   Time 4   Period Weeks   Status Achieved     PT SHORT TERM GOAL #3   Title demonstrate Lt shoulder PROM flexion to 90 degrees to improve mobility and allow for use as able   Time 4   Period Weeks   Status Achieved     PT SHORT TERM GOAL #4   Title demonstrate 4+/5 Rt shoulder strength to improve use   Time 4   Period Days   Status On-going           PT Long Term Goals - 04/07/16 1115      PT LONG TERM GOAL #1   Title be independent in  advanced HEP   Time 8   Period Weeks   Status On-going     PT LONG TERM GOAL #5   Title demonstrate Rt shoulder IR to L2 to improve use with self-care   Time 8   Period Weeks   Status  Achieved               Plan - April 22, 2016 1114    Clinical Impression Statement Pt visably performs her exercises with less shaking and an overall stonger technique. AROm continues to improve per measuremnts. FOTO score significantly improved.   Rehab Potential Good   PT Frequency 2x / week   PT Duration 6 weeks   PT Treatment/Interventions ADLs/Self Care Home Management;Electrical Stimulation;Cryotherapy;Functional mobility training;Moist Heat;Therapeutic activities;Therapeutic exercise;Neuromuscular re-education;Patient/family education;Passive range of motion;Scar mobilization;Manual techniques;Vasopneumatic Device;Taping   Consulted and Agree with Plan of Care Patient      Patient will benefit from skilled therapeutic intervention in order to improve the following deficits and impairments:  Decreased range of motion, Pain, Increased muscle spasms, Decreased endurance, Decreased activity tolerance, Postural dysfunction, Increased edema, Decreased strength, Impaired flexibility, Decreased scar mobility, Impaired UE functional use  Visit Diagnosis: Stiffness of left shoulder, not elsewhere classified  Acute pain of left shoulder  Muscle weakness (generalized)  Stiffness of right shoulder, not elsewhere classified  Acute pain of right shoulder       G-Codes - 2016/04/22 1144    Functional Assessment Tool Used (Outpatient Only) FOTO: 41% limitation   Functional Limitation Other PT primary   Other PT Primary Current Status (F0932) At least 40 percent but less than 60 percent impaired, limited or restricted   Other PT Primary Goal Status (T5573) At least 40 percent but less than 60 percent impaired, limited or restricted     Sigurd Sos, PT 04-22-2016 11:55 AM  Problem List Patient Active  Problem List   Diagnosis Date Noted  . History of palpitations 09/16/2015  . Pre-operative cardiovascular examination 09/16/2015  . Bipolar 1 disorder ([place]) 09/16/2015  . Palpitations 08/28/2014  . Hyperlipidemia LDL goal <70 11/26/2013  . History of breast cancer 11/16/2012  . Fibromyalgia 11/16/2012  . History of tobacco abuse 11/16/2012  . MRSA 04/13/2010  . INTERTRIGO 04/13/2010  . GROUP B STREPTOCOCCUS INFECTION, HX OF 04/13/2010  . HYPERLIPIDEMIA 09/02/2006  . DEPRESSION 09/02/2006  . Essential hypertension 09/02/2006  . COPD 09/02/2006  . GERD 09/02/2006   Myrene Galas, PTA 04-22-2016 11:54 AM   [facility] [address], [place], [place], [address] Phone: [phone]   Fax:  [phone]  Name: Anita Arroyo MRN: [id number] Date of Birth: 05-02-1960

## 2016-04-07 NOTE — Patient Instructions (Signed)
ABDUCTION: Supine (Active)    Lie on back, right arm at side, THUMB UP. [place] Lift arm out to side and towards ear, keeping elbow straight. No weights necessary. Complete 2___ sets of __10_ repetitions. Perform _2  sessions per day.  Copyright  VHI. All rights reserved.

## 2016-04-12 ENCOUNTER — Ambulatory Visit: Payer: Medicare HMO

## 2016-04-12 DIAGNOSIS — M6281 Muscle weakness (generalized): Secondary | ICD-10-CM

## 2016-04-12 DIAGNOSIS — M25511 Pain in right shoulder: Secondary | ICD-10-CM

## 2016-04-12 DIAGNOSIS — M25512 Pain in left shoulder: Secondary | ICD-10-CM | POA: Diagnosis not present

## 2016-04-12 DIAGNOSIS — M25611 Stiffness of right shoulder, not elsewhere classified: Secondary | ICD-10-CM

## 2016-04-12 DIAGNOSIS — M25612 Stiffness of left shoulder, not elsewhere classified: Secondary | ICD-10-CM | POA: Diagnosis not present

## 2016-04-12 NOTE — Therapy (Signed)
[facility] [address], [place], [place], [address] Phone: [phone]   Fax:  [phone]  Physical Therapy Treatment  Patient Details  Name: Anita Arroyo MRN: [id number] Date of Birth: 01-Nov-1960 Referring Provider: Kathryne Hitch, MD  Encounter Date: 04/12/2016      PT End of Session - 04/12/16 1148    Visit Number 21   Number of Visits 30   Date for PT Re-Evaluation 04/19/16   Authorization Time Period KX mod   PT Start Time 1102   PT Stop Time 1203   PT Time Calculation (min) 61 min   Activity Tolerance Patient tolerated treatment well   Behavior During Therapy [facility] for tasks assessed/performed      Past Medical History:  Diagnosis Date  . ADHD (attention deficit hyperactivity disorder)   . Arthritis   . Asthma   . Bipolar 1 disorder ([place])   . Bipolar 1 disorder ([place])   . Breast cancer ([place])   . Depression   . Dysrhythmia    palpitations  . Dysrhythmia    atrial fibrillation  . Fibromyalgia   . GERD (gastroesophageal reflux disease)   . Heart murmur   . History of stress test 04/28/2011   showed normal perfusion without scar or ischemia  . Hx of echocardiogram 04/28/2011   showed normal systolic function with mild diastolic dysfunction,she had trace MR but did not have frank mitral valve prolapse demonstrated. She had mild pulmonary hypertension with an estimated RV systolic pressure at 34 mm.  . Hyperlipemia   . Meniere disease   . MVP (mitral valve prolapse)   . Neuromuscular disorder ([place])    fibromyalgia    Past Surgical History:  Procedure Laterality Date  . ANKLE SURGERY     left x4  . BREAST SURGERY  2011   lumpectomy right breast   . COLONOSCOPY WITH PROPOFOL N/A 02/25/2014   Procedure: COLONOSCOPY WITH PROPOFOL;  Surgeon: Garlan Fair, MD;  [facility];  Service: Endoscopy;  Laterality: N/A;  . deviated septum    . ECTOPIC PREGNANCY SURGERY    . ELBOW SURGERY     right elbow  . EYE SURGERY     cataract surgery  . fibroid     2-3 fibroid adenomas removed  . JOINT REPLACEMENT  2011   right knee   . KNEE SURGERY     left knee  . SHOULDER ARTHROSCOPY WITH BICEPSTENOTOMY Right 09/25/2015   Procedure: SHOULDER ARTHROSCOPY WITH BICEPSTENOTOMY;  Surgeon: Ninetta Lights, MD;  Location: [facility];  Service: Orthopedics;  Laterality: Right;  . SHOULDER ARTHROSCOPY WITH DISTAL CLAVICLE RESECTION Right 09/25/2015   Procedure: SHOULDER ARTHROSCOPY WITH DISTAL CLAVICLE RESECTION;  Surgeon: Ninetta Lights, MD;  Location: [facility];  Service: Orthopedics;  Laterality: Right;  . SHOULDER ARTHROSCOPY WITH SUBACROMIAL DECOMPRESSION Right 09/25/2015   Procedure: RIGHT SHOULDER ARTHROSCOPY DEBRIDEMENT,ACROMIOPLASTY,DISTAL CLAVICAL EXCISION, RELEASE OF BICEPS TENDON;  Surgeon: Ninetta Lights, MD;  Location: [facility];  Service: Orthopedics;  Laterality: Right;  . TMJ ARTHROPLASTY    . TONSILLECTOMY    . TOTAL SHOULDER ARTHROPLASTY Left 01/05/2016  . UTERINE FIBROID SURGERY     polups and fibroids removed   . WRIST SURGERY     left    There were no vitals filed for this visit.      Subjective Assessment - 04/12/16 1103    Currently in Pain? Yes   Pain Score 2  Pain Location Shoulder   Pain Orientation Right   Pain Descriptors / Indicators Sore   Pain Type Surgical pain   Pain Onset More than a month ago   Pain Frequency Intermittent   Aggravating Factors  using it   Pain Relieving Factors meds, rest, heat   Multiple Pain Sites Yes   Pain Score 4   Pain Location Shoulder   Pain Orientation Left   Pain Descriptors / Indicators Aching;Sore   Pain Type Surgical pain   Pain Onset More than a month ago   Pain Frequency Constant   Aggravating Factors  using it   Pain Relieving Factors rest                         [facility] Adult PT Treatment/Exercise - 04/12/16 0001      Shoulder Exercises:  Sidelying   External Rotation Strengthening;Left;20 reps;Weights   External Rotation Weight (lbs) 2   ABduction Strengthening;Left;20 reps;Weights   ABduction Weight (lbs) 2     Shoulder Exercises: Standing   External Rotation Strengthening;Right;20 reps;Theraband  10x LT, VC for form   Theraband Level (Shoulder External Rotation) Level 3 (Green)   Flexion Strengthening;Both;20 reps   Shoulder Flexion Weight (lbs) 2# Lt, 3# Rt   ABduction --  Attempted against wall, too painful, shifted to supine   Extension Strengthening;Both;20 reps;Theraband   Theraband Level (Shoulder Extension) Level 3 (Green)   Row Strengthening;Both;20 reps;Theraband   Theraband Level (Shoulder Row) Level 3 (Green)   Other Standing Exercises weight to 2nd shelf: 3# on Rt, 2# on Rt     Moist Heat Therapy   Number Minutes Moist Heat 15 Minutes   Moist Heat Location Shoulder  bil shoulders     Manual Therapy   Manual Therapy Soft tissue mobilization;Myofascial release   Manual therapy comments orange roller ball over Lt pectoralis for tissue mobilization, elongation of Lt pectoralis                  PT Short Term Goals - 04/12/16 1105      PT SHORT TERM GOAL #4   Title demonstrate 4+/5 Rt shoulder strength to improve use   Time 4   Period Days   Status On-going     PT SHORT TERM GOAL #5   Title report > or = to 50% use of Rt UE with self-care and ADLs   Status Achieved           PT Long Term Goals - 04/12/16 1105      PT LONG TERM GOAL #1   Title be independent in advanced HEP   Time 8   Period Weeks   Status On-going     PT LONG TERM GOAL #3   Title demonstrate Lt shoulder P/ROM flexion to > or = to 110 degrees to improve use     PT LONG TERM GOAL #5   Title demonstrate Rt shoulder IR to L2 to improve use with self-care   Status Achieved     PT LONG TERM GOAL #6   Title report < or = to 4/10 Rt and Lt shoulder pain with daily use   Time 8   Period Weeks   Status  On-going               Plan - 04/12/16 1107    Clinical Impression Statement Pt is making steady progress s/p Lt shoulder replacement.  FOTO was 41% limitation last session.  Pt  with improved Rt &  shoulder flexion to 145 degrees measured last session.  Pt is using Rt and Lt shoulder more at home and isn't as limited with use with self-care and ADLS.  Pt able to perform exercises with increased resistance today. Pt will continue PT for 1 more week to finalize HEP for strength and flexibility.     Rehab Potential Good   PT Frequency 2x / week   PT Duration 6 weeks   PT Treatment/Interventions ADLs/Self Care Home Management;Electrical Stimulation;Cryotherapy;Functional mobility training;Moist Heat;Therapeutic activities;Therapeutic exercise;Neuromuscular re-education;Patient/family education;Passive range of motion;Scar mobilization;Manual techniques;Vasopneumatic Device;Taping   PT Next Visit Plan Plan D/C 04/19/16.  Bil shoulder AROm and strength   Consulted and Agree with Plan of Care Patient      Patient will benefit from skilled therapeutic intervention in order to improve the following deficits and impairments:  Decreased range of motion, Pain, Increased muscle spasms, Decreased endurance, Decreased activity tolerance, Postural dysfunction, Increased edema, Decreased strength, Impaired flexibility, Decreased scar mobility, Impaired UE functional use  Visit Diagnosis: Acute pain of left shoulder  Muscle weakness (generalized)  Stiffness of right shoulder, not elsewhere classified  Acute pain of right shoulder  Stiffness of left shoulder, not elsewhere classified     Problem List Patient Active Problem List   Diagnosis Date Noted  . History of palpitations 09/16/2015  . Pre-operative cardiovascular examination 09/16/2015  . Bipolar 1 disorder ([place]) 09/16/2015  . Palpitations 08/28/2014  . Hyperlipidemia LDL goal <70 11/26/2013  . History of breast cancer 11/16/2012  .  Fibromyalgia 11/16/2012  . History of tobacco abuse 11/16/2012  . MRSA 04/13/2010  . INTERTRIGO 04/13/2010  . GROUP B STREPTOCOCCUS INFECTION, HX OF 04/13/2010  . HYPERLIPIDEMIA 09/02/2006  . DEPRESSION 09/02/2006  . Essential hypertension 09/02/2006  . COPD 09/02/2006  . GERD 09/02/2006     Sigurd Sos, PT 04/12/16 11:50 AM  [facility] [address], [place], [place], [address] Phone: [phone]   Fax:  [phone]  Name: Anita Arroyo MRN: [id number] Date of Birth: August 31, 1960

## 2016-04-13 ENCOUNTER — Ambulatory Visit (INDEPENDENT_AMBULATORY_CARE_PROVIDER_SITE_OTHER)
Admission: RE | Admit: 2016-04-13 | Discharge: 2016-04-13 | Disposition: A | Discharge status: Home / Self Care | Payer: Medicare HMO | Source: Ambulatory Visit | Attending: Acute Care | Admitting: Acute Care

## 2016-04-13 DIAGNOSIS — F17210 Nicotine dependence, cigarettes, uncomplicated: Secondary | ICD-10-CM | POA: Diagnosis not present

## 2016-04-13 DIAGNOSIS — Z87891 Personal history of nicotine dependence: Secondary | ICD-10-CM

## 2016-04-14 ENCOUNTER — Ambulatory Visit: Payer: Medicare HMO | Admitting: Physical Therapy

## 2016-04-14 ENCOUNTER — Encounter: Payer: Self-pay | Admitting: Physical Therapy

## 2016-04-14 ENCOUNTER — Telehealth: Payer: Self-pay | Admitting: Acute Care

## 2016-04-14 DIAGNOSIS — M25612 Stiffness of left shoulder, not elsewhere classified: Secondary | ICD-10-CM | POA: Diagnosis not present

## 2016-04-14 DIAGNOSIS — M6281 Muscle weakness (generalized): Secondary | ICD-10-CM

## 2016-04-14 DIAGNOSIS — M25511 Pain in right shoulder: Secondary | ICD-10-CM | POA: Diagnosis not present

## 2016-04-14 DIAGNOSIS — M25512 Pain in left shoulder: Secondary | ICD-10-CM

## 2016-04-14 DIAGNOSIS — F17210 Nicotine dependence, cigarettes, uncomplicated: Secondary | ICD-10-CM

## 2016-04-14 DIAGNOSIS — M25611 Stiffness of right shoulder, not elsewhere classified: Secondary | ICD-10-CM

## 2016-04-14 NOTE — Telephone Encounter (Signed)
Spoke with pt and advised of ct results per Eric Form, NP.  Pt verbalized understanding.  Copy sent to PCP and Cardiologist.  Order placed for 1 yr f/u low dose ct.

## 2016-04-14 NOTE — Therapy (Signed)
[facility] [address], [place], [place], [address] Phone: [phone]   Fax:  [phone]  Physical Therapy Treatment  Patient Details  Name: Anita Arroyo MRN: [id number] Date of Birth: Nov 08, 1960 Referring Provider: Kathryne Hitch, MD  Encounter Date: 04/14/2016      PT End of Session - 04/14/16 1056    Visit Number 22   Number of Visits 30   Date for PT Re-Evaluation 04/19/16   Authorization Time Period KX mod   PT Start Time 1057  Pt simply finished her routine quicky today   PT Stop Time 1145   PT Time Calculation (min) 48 min   Activity Tolerance Patient tolerated treatment well   Behavior During Therapy [facility] for tasks assessed/performed      Past Medical History:  Diagnosis Date  . ADHD (attention deficit hyperactivity disorder)   . Arthritis   . Asthma   . Bipolar 1 disorder ([place])   . Bipolar 1 disorder ([place])   . Breast cancer ([place])   . Depression   . Dysrhythmia    palpitations  . Dysrhythmia    atrial fibrillation  . Fibromyalgia   . GERD (gastroesophageal reflux disease)   . Heart murmur   . History of stress test 04/28/2011   showed normal perfusion without scar or ischemia  . Hx of echocardiogram 04/28/2011   showed normal systolic function with mild diastolic dysfunction,she had trace MR but did not have frank mitral valve prolapse demonstrated. She had mild pulmonary hypertension with an estimated RV systolic pressure at 34 mm.  . Hyperlipemia   . Meniere disease   . MVP (mitral valve prolapse)   . Neuromuscular disorder ([place])    fibromyalgia    Past Surgical History:  Procedure Laterality Date  . ANKLE SURGERY     left x4  . BREAST SURGERY  2011   lumpectomy right breast   . COLONOSCOPY WITH PROPOFOL N/A 02/25/2014   Procedure: COLONOSCOPY WITH PROPOFOL;  Surgeon: Garlan Fair, MD;  Location: [facility];  Service: Endoscopy;  Laterality: N/A;  . deviated septum    .  ECTOPIC PREGNANCY SURGERY    . ELBOW SURGERY     right elbow  . EYE SURGERY     cataract surgery  . fibroid     2-3 fibroid adenomas removed  . JOINT REPLACEMENT  2011   right knee   . KNEE SURGERY     left knee  . SHOULDER ARTHROSCOPY WITH BICEPSTENOTOMY Right 09/25/2015   Procedure: SHOULDER ARTHROSCOPY WITH BICEPSTENOTOMY;  Surgeon: Ninetta Lights, MD;  Location: [place];  Service: Orthopedics;  Laterality: Right;  . SHOULDER ARTHROSCOPY WITH DISTAL CLAVICLE RESECTION Right 09/25/2015   Procedure: SHOULDER ARTHROSCOPY WITH DISTAL CLAVICLE RESECTION;  Surgeon: Ninetta Lights, MD;  Location: [place];  Service: Orthopedics;  Laterality: Right;  . SHOULDER ARTHROSCOPY WITH SUBACROMIAL DECOMPRESSION Right 09/25/2015   Procedure: RIGHT SHOULDER ARTHROSCOPY DEBRIDEMENT,ACROMIOPLASTY,DISTAL CLAVICAL EXCISION, RELEASE OF BICEPS TENDON;  Surgeon: Ninetta Lights, MD;  Location: [place];  Service: Orthopedics;  Laterality: Right;  . TMJ ARTHROPLASTY    . TONSILLECTOMY    . TOTAL SHOULDER ARTHROPLASTY Left 01/05/2016  . UTERINE FIBROID SURGERY     polups and fibroids removed   . WRIST SURGERY     left    There were no vitals filed for this visit.      Subjective Assessment - 04/14/16 1106    Subjective  Sore from green band, will work up to it.    Currently in Pain? No/denies   Multiple Pain Sites No                         [facility] Adult PT Treatment/Exercise - 04/14/16 0001      Shoulder Exercises: Sidelying   External Rotation Strengthening;Left;Weights   External Rotation Weight (lbs) 1  3x10   ABduction Strengthening;Left;Weights   ABduction Weight (lbs) 1  3x10     Shoulder Exercises: Standing   External Rotation Strengthening;Right;Theraband  3x10   Theraband Level (Shoulder External Rotation) Level 2 (Red)   Flexion Strengthening;Both;20 reps   Shoulder Flexion Weight (lbs) 2# Lt, 3# Rt   Extension  Strengthening;Both;20 reps;Theraband   Theraband Level (Shoulder Extension) Level 2 (Red)   Row Strengthening;Both;20 reps;Theraband   Theraband Level (Shoulder Row) Level 2 (Red)   Other Standing Exercises weight to 2nd shelf: 3# on Rt, 2# on Rt     Shoulder Exercises: Pulleys   Flexion 3 minutes   ABduction 3 minutes     Moist Heat Therapy   Number Minutes Moist Heat 15 Minutes   Moist Heat Location Shoulder  bil shoulders     Manual Therapy   Manual Therapy Soft tissue mobilization;Myofascial release   Soft tissue mobilization LT pectoral, RTC insertion                  PT Short Term Goals - 04/12/16 1105      PT SHORT TERM GOAL #4   Title demonstrate 4+/5 Rt shoulder strength to improve use   Time 4   Period Days   Status On-going     PT SHORT TERM GOAL #5   Title report > or = to 50% use of Rt UE with self-care and ADLs   Status Achieved           PT Long Term Goals - 04/12/16 1105      PT LONG TERM GOAL #1   Title be independent in advanced HEP   Time 8   Period Weeks   Status On-going     PT LONG TERM GOAL #3   Title demonstrate Lt shoulder P/ROM flexion to > or = to 110 degrees to improve use     PT LONG TERM GOAL #5   Title demonstrate Rt shoulder IR to L2 to improve use with self-care   Status Achieved     PT LONG TERM GOAL #6   Title report < or = to 4/10 Rt and Lt shoulder pain with daily use   Time 8   Period Weeks   Status On-going               Plan - 04/14/16 1100    Clinical Impression Statement Sore from green band, did fine reducing it back to the red band. reduced weight in sidelying helped as she was sore after soft tissue work.    Rehab Potential Good   PT Frequency 2x / week   PT Duration 6 weeks   PT Treatment/Interventions ADLs/Self Care Home Management;Electrical Stimulation;Cryotherapy;Functional mobility training;Moist Heat;Therapeutic activities;Therapeutic exercise;Neuromuscular  re-education;Patient/family education;Passive range of motion;Scar mobilization;Manual techniques;Vasopneumatic Device;Taping   PT Next Visit Plan Plan D/C 04/19/16.  Bil shoulder AROm and strength   Consulted and Agree with Plan of Care Patient      Patient will benefit from skilled therapeutic intervention in order to improve the following deficits and impairments:  Decreased  range of motion, Pain, Increased muscle spasms, Decreased endurance, Decreased activity tolerance, Postural dysfunction, Increased edema, Decreased strength, Decreased scar mobility, Impaired UE functional use  Visit Diagnosis: Acute pain of left shoulder  Muscle weakness (generalized)  Stiffness of right shoulder, not elsewhere classified  Stiffness of left shoulder, not elsewhere classified     Problem List Patient Active Problem List   Diagnosis Date Noted  . History of palpitations 09/16/2015  . Pre-operative cardiovascular examination 09/16/2015  . Bipolar 1 disorder ([place]) 09/16/2015  . Palpitations 08/28/2014  . Hyperlipidemia LDL goal <70 11/26/2013  . History of breast cancer 11/16/2012  . Fibromyalgia 11/16/2012  . History of tobacco abuse 11/16/2012  . MRSA 04/13/2010  . INTERTRIGO 04/13/2010  . GROUP B STREPTOCOCCUS INFECTION, HX OF 04/13/2010  . HYPERLIPIDEMIA 09/02/2006  . DEPRESSION 09/02/2006  . Essential hypertension 09/02/2006  . COPD 09/02/2006  . GERD 09/02/2006    COCHRAN,JENNIFER, PTA 04/14/2016, 11:37 AM  [facility] [address] [place], [place], [address] Phone: [phone]   Fax:  [phone]  Name: Anita Arroyo MRN: [id number] Date of Birth: May 05, 1960

## 2016-04-19 ENCOUNTER — Encounter: Payer: Self-pay | Admitting: Physical Therapy

## 2016-04-19 ENCOUNTER — Ambulatory Visit: Payer: Medicare HMO | Admitting: Physical Therapy

## 2016-04-19 DIAGNOSIS — M25512 Pain in left shoulder: Secondary | ICD-10-CM

## 2016-04-19 DIAGNOSIS — M25612 Stiffness of left shoulder, not elsewhere classified: Secondary | ICD-10-CM | POA: Diagnosis not present

## 2016-04-19 DIAGNOSIS — M25511 Pain in right shoulder: Secondary | ICD-10-CM

## 2016-04-19 DIAGNOSIS — M6281 Muscle weakness (generalized): Secondary | ICD-10-CM | POA: Diagnosis not present

## 2016-04-19 DIAGNOSIS — M25611 Stiffness of right shoulder, not elsewhere classified: Secondary | ICD-10-CM

## 2016-04-19 NOTE — Therapy (Signed)
[facility] [address] [place], [place], [address] Phone: [phone]   Fax:  [phone]  Physical Therapy Treatment  Patient Details  Name: Anita Arroyo MRN: [id number] Date of Birth: 04-16-1960 Referring Provider: Mckinley Jewel, MD  Encounter Date: 04/19/2016      PT End of Session - 04/19/16 1103    Visit Number 23   Number of Visits 30   Date for PT Re-Evaluation 04/19/16   Authorization Time Period KX mod   PT Start Time 1100   PT Stop Time 1155   PT Time Calculation (min) 55 min   Activity Tolerance Patient tolerated treatment well   Behavior During Therapy [facility] for tasks assessed/performed      Past Medical History:  Diagnosis Date  . ADHD (attention deficit hyperactivity disorder)   . Arthritis   . Asthma   . Bipolar 1 disorder ([facility])   . Bipolar 1 disorder ([facility])   . Breast cancer ([facility])   . Depression   . Dysrhythmia    palpitations  . Dysrhythmia    atrial fibrillation  . Fibromyalgia   . GERD (gastroesophageal reflux disease)   . Heart murmur   . History of stress test 04/28/2011   showed normal perfusion without scar or ischemia  . Hx of echocardiogram 04/28/2011   showed normal systolic function with mild diastolic dysfunction,she had trace MR but did not have frank mitral valve prolapse demonstrated. She had mild pulmonary hypertension with an estimated RV systolic pressure at 34 mm.  . Hyperlipemia   . Meniere disease   . MVP (mitral valve prolapse)   . Neuromuscular disorder ([facility])    fibromyalgia    Past Surgical History:  Procedure Laterality Date  . ANKLE SURGERY     left x4  . BREAST SURGERY  2011   lumpectomy right breast   . COLONOSCOPY WITH PROPOFOL N/A 02/25/2014   Procedure: COLONOSCOPY WITH PROPOFOL;  Surgeon: Charolett Bumpers, MD;  [facility];  Service: Endoscopy;  Laterality: N/A;  . deviated septum    . ECTOPIC PREGNANCY SURGERY    . ELBOW SURGERY     right elbow  . EYE SURGERY     cataract surgery  . fibroid     2-3 fibroid adenomas removed  . JOINT REPLACEMENT  2011   right knee   . KNEE SURGERY     left knee  . SHOULDER ARTHROSCOPY WITH BICEPSTENOTOMY Right 09/25/2015   Procedure: SHOULDER ARTHROSCOPY WITH BICEPSTENOTOMY;  Surgeon: Loreta Ave, MD;  Location: [facility];  Service: Orthopedics;  Laterality: Right;  . SHOULDER ARTHROSCOPY WITH DISTAL CLAVICLE RESECTION Right 09/25/2015   Procedure: SHOULDER ARTHROSCOPY WITH DISTAL CLAVICLE RESECTION;  Surgeon: Loreta Ave, MD;  Location: [facility];  Service: Orthopedics;  Laterality: Right;  . SHOULDER ARTHROSCOPY WITH SUBACROMIAL DECOMPRESSION Right 09/25/2015   Procedure: RIGHT SHOULDER ARTHROSCOPY DEBRIDEMENT,ACROMIOPLASTY,DISTAL CLAVICAL EXCISION, RELEASE OF BICEPS TENDON;  Surgeon: Loreta Ave, MD;  Location: [facility];  Service: Orthopedics;  Laterality: Right;  . TMJ ARTHROPLASTY    . TONSILLECTOMY    . TOTAL SHOULDER ARTHROPLASTY Left 01/05/2016  . UTERINE FIBROID SURGERY     polups and fibroids removed   . WRIST SURGERY     left    There were no vitals filed for this visit.      Subjective Assessment - 04/19/16 1105    Subjective No new complaints.   Currently in Pain? No/denies  [facility] Adult PT Treatment/Exercise - 04/19/16 0001      Shoulder Exercises: Sidelying   External Rotation Strengthening;Left;Weights   External Rotation Weight (lbs) 1   ABduction Strengthening;Left;Weights   ABduction Weight (lbs) 1     Shoulder Exercises: Standing   External Rotation Strengthening;Right;Theraband  3x10   Theraband Level (Shoulder External Rotation) Level 2 (Red)   Flexion Strengthening;Left;20 reps  1#   Shoulder Flexion Weight (lbs) 2# Lt, 3# Rt   Extension Strengthening;Both;20 reps;Theraband   Theraband Level (Shoulder Extension) Level 2 (Red)   Row  Strengthening;Both;20 reps;Theraband   Theraband Level (Shoulder Row) Level 3 (Green)   Other Standing Exercises weight to 2nd shelf: 3# on Rt, 2# on Rt     Shoulder Exercises: Pulleys   Flexion 3 minutes   ABduction 3 minutes     Moist Heat Therapy   Number Minutes Moist Heat 15 Minutes   Moist Heat Location Shoulder  bil shoulders     Manual Therapy   Manual Therapy Soft tissue mobilization;Myofascial release   Soft tissue mobilization LT pectoral, RTC insertion                  PT Short Term Goals - 04/12/16 1105      PT SHORT TERM GOAL #4   Title demonstrate 4+/5 Rt shoulder strength to improve use   Time 4   Period Days   Status On-going     PT SHORT TERM GOAL #5   Title report > or = to 50% use of Rt UE with self-care and ADLs   Status Achieved           PT Long Term Goals - 04/12/16 1105      PT LONG TERM GOAL #1   Title be independent in advanced HEP   Time 8   Period Weeks   Status On-going     PT LONG TERM GOAL #3   Title demonstrate Lt shoulder P/ROM flexion to > or = to 110 degrees to improve use     PT LONG TERM GOAL #5   Title demonstrate Rt shoulder IR to L2 to improve use with self-care   Status Achieved     PT LONG TERM GOAL #6   Title report < or = to 4/10 Rt and Lt shoulder pain with daily use   Time 8   Period Weeks   Status On-going               Plan - 04/19/16 1104    Clinical Impression Statement Less soreness today than on last session. Pectoral muscles very tight. Pt thinks she is doing more with her LTUE and that is why it feels tighter today.    Rehab Potential Good   PT Frequency 2x / week   PT Duration 6 weeks   PT Treatment/Interventions ADLs/Self Care Home Management;Electrical Stimulation;Cryotherapy;Functional mobility training;Moist Heat;Therapeutic activities;Therapeutic exercise;Neuromuscular re-education;Patient/family education;Passive range of motion;Scar mobilization;Manual  techniques;Vasopneumatic Device;Taping   PT Next Visit Plan DC protocol  next visit   Consulted and Agree with Plan of Care Patient      Patient will benefit from skilled therapeutic intervention in order to improve the following deficits and impairments:  Decreased range of motion, Pain, Increased muscle spasms, Decreased endurance, Decreased activity tolerance, Postural dysfunction, Increased edema, Decreased strength, Decreased scar mobility, Impaired UE functional use  Visit Diagnosis: Acute pain of left shoulder  Muscle weakness (generalized)  Stiffness of right shoulder, not elsewhere classified  Stiffness of left shoulder,  not elsewhere classified  Acute pain of right shoulder     Problem List Patient Active Problem List   Diagnosis Date Noted  . History of palpitations 09/16/2015  . Pre-operative cardiovascular examination 09/16/2015  . Bipolar 1 disorder ([facility]) 09/16/2015  . Palpitations 08/28/2014  . Hyperlipidemia LDL goal <70 11/26/2013  . History of breast cancer 11/16/2012  . Fibromyalgia 11/16/2012  . History of tobacco abuse 11/16/2012  . MRSA 04/13/2010  . INTERTRIGO 04/13/2010  . GROUP B STREPTOCOCCUS INFECTION, HX OF 04/13/2010  . HYPERLIPIDEMIA 09/02/2006  . DEPRESSION 09/02/2006  . Essential hypertension 09/02/2006  . COPD 09/02/2006  . GERD 09/02/2006    Lama Narayanan, PTA 04/19/2016, 11:41 AM  [facility] [address] [place], [place], [address] Phone: [phone]   Fax:  [phone]  Name: Anita Arroyo MRN: [id number] Date of Birth: December 05, 1960

## 2016-04-21 ENCOUNTER — Other Ambulatory Visit: Payer: Self-pay

## 2016-04-21 ENCOUNTER — Ambulatory Visit: Payer: Medicare HMO

## 2016-04-21 DIAGNOSIS — M25511 Pain in right shoulder: Secondary | ICD-10-CM | POA: Diagnosis not present

## 2016-04-21 DIAGNOSIS — M25512 Pain in left shoulder: Secondary | ICD-10-CM | POA: Diagnosis not present

## 2016-04-21 DIAGNOSIS — M25611 Stiffness of right shoulder, not elsewhere classified: Secondary | ICD-10-CM | POA: Diagnosis not present

## 2016-04-21 DIAGNOSIS — M25612 Stiffness of left shoulder, not elsewhere classified: Secondary | ICD-10-CM

## 2016-04-21 DIAGNOSIS — M6281 Muscle weakness (generalized): Secondary | ICD-10-CM | POA: Diagnosis not present

## 2016-04-21 NOTE — Patient Outreach (Signed)
[facility]) Care Management  04/21/2016  Anita Arroyo 03/14/1960 [id number]   Patient has not responded to calls or letter.  Will close case and notify care management assistant of case closure.  Jone Baseman, RN, MSN [place] [phone]

## 2016-04-21 NOTE — Therapy (Signed)
[facility] [address], [place], [place], [address] Phone: [phone]   Fax:  [phone]  Physical Therapy Treatment  Patient Details  Name: Anita Arroyo MRN: [id number] Date of Birth: 08/20/1960 Referring Provider: Kathryne Hitch, MD  Encounter Date: 04/21/2016      PT End of Session - 04/21/16 1148    Visit Number 24   Number of Visits 30   Date for PT Re-Evaluation 04/19/16   PT Start Time 1110  late   PT Stop Time 1159   PT Time Calculation (min) 49 min   Activity Tolerance Patient tolerated treatment well   Behavior During Therapy [facility] for tasks assessed/performed      Past Medical History:  Diagnosis Date  . ADHD (attention deficit hyperactivity disorder)   . Arthritis   . Asthma   . Bipolar 1 disorder ([place])   . Bipolar 1 disorder ([place])   . Breast cancer ([place])   . Depression   . Dysrhythmia    palpitations  . Dysrhythmia    atrial fibrillation  . Fibromyalgia   . GERD (gastroesophageal reflux disease)   . Heart murmur   . History of stress test 04/28/2011   showed normal perfusion without scar or ischemia  . Hx of echocardiogram 04/28/2011   showed normal systolic function with mild diastolic dysfunction,she had trace MR but did not have frank mitral valve prolapse demonstrated. She had mild pulmonary hypertension with an estimated RV systolic pressure at 34 mm.  . Hyperlipemia   . Meniere disease   . MVP (mitral valve prolapse)   . Neuromuscular disorder ([place])    fibromyalgia    Past Surgical History:  Procedure Laterality Date  . ANKLE SURGERY     left x4  . BREAST SURGERY  2011   lumpectomy right breast   . COLONOSCOPY WITH PROPOFOL N/A 02/25/2014   Procedure: COLONOSCOPY WITH PROPOFOL;  Surgeon: Garlan Fair, MD;  [facility];  Service: Endoscopy;  Laterality: N/A;  . deviated septum    . ECTOPIC PREGNANCY SURGERY    . ELBOW SURGERY     right elbow  . EYE SURGERY      cataract surgery  . fibroid     2-3 fibroid adenomas removed  . JOINT REPLACEMENT  2011   right knee   . KNEE SURGERY     left knee  . SHOULDER ARTHROSCOPY WITH BICEPSTENOTOMY Right 09/25/2015   Procedure: SHOULDER ARTHROSCOPY WITH BICEPSTENOTOMY;  Surgeon: Ninetta Lights, MD;  [facility];  Service: Orthopedics;  Laterality: Right;  . SHOULDER ARTHROSCOPY WITH DISTAL CLAVICLE RESECTION Right 09/25/2015   Procedure: SHOULDER ARTHROSCOPY WITH DISTAL CLAVICLE RESECTION;  Surgeon: Ninetta Lights, MD;  Location: [place];  Service: Orthopedics;  Laterality: Right;  . SHOULDER ARTHROSCOPY WITH SUBACROMIAL DECOMPRESSION Right 09/25/2015   Procedure: RIGHT SHOULDER ARTHROSCOPY DEBRIDEMENT,ACROMIOPLASTY,DISTAL CLAVICAL EXCISION, RELEASE OF BICEPS TENDON;  Surgeon: Ninetta Lights, MD;  Location: [place];  Service: Orthopedics;  Laterality: Right;  . TMJ ARTHROPLASTY    . TONSILLECTOMY    . TOTAL SHOULDER ARTHROPLASTY Left 01/05/2016  . UTERINE FIBROID SURGERY     polups and fibroids removed   . WRIST SURGERY     left    There were no vitals filed for this visit.      Subjective Assessment - 04/21/16 1115    Subjective Pt 10 minutes late as she couldn't find a parking spot in our  lot.  Pt asked for HEP handout.     Patient Stated Goals improve A/ROM, P/ROM of Rt and Lt shoulder, use Lt UE   Currently in Pain? Yes   Pain Score 3    Pain Orientation Right   Pain Descriptors / Indicators Sore   Pain Score 3   Pain Location Shoulder   Pain Orientation Left   Pain Descriptors / Indicators Aching;Sore            [facility] PT Assessment - 04/21/16 0001      Assessment   Medical Diagnosis s/p acromioplasty Rt, SAD     Cognition   Overall Cognitive Status Within Functional Limits for tasks assessed     Observation/Other Assessments   Focus on Therapeutic Outcomes (FOTO)  41%      ROM / Strength   AROM / PROM / Strength  AROM;PROM;Strength     AROM   Right/Left Shoulder Right;Left   Right Shoulder Flexion 145 Degrees   Right Shoulder ABduction 121 Degrees   Right Shoulder Internal Rotation --  to T7   Right Shoulder External Rotation --  to T2   Left Shoulder Flexion 145 Degrees   Left Shoulder ABduction 95 Degrees   Left Shoulder Internal Rotation --  to T11   Left Shoulder External Rotation --  to T3     Strength   Overall Strength Deficits   Strength Assessment Site Shoulder   Right/Left Shoulder Right;Left   Right Shoulder Flexion 4+/5   Right Shoulder ABduction 4/5   Right Shoulder Internal Rotation 4+/5   Right Shoulder External Rotation 4/5   Left Shoulder Flexion 4-/5  pain   Left Shoulder ABduction 4-/5  pain   Left Shoulder Internal Rotation 4/5   Left Shoulder External Rotation 4-/5                     [facility] Adult PT Treatment/Exercise - 04/21/16 0001      Shoulder Exercises: Seated   Flexion Strengthening;Both;20 reps   Flexion Weight (lbs) 3# Rt, 2# Lt   Abduction Strengthening;20 reps;Weights   ABduction Weight (lbs) 3# Rt, 2# Lt.  Scaption 2x10     Shoulder Exercises: Pulleys   Flexion 3 minutes   ABduction 3 minutes     Moist Heat Therapy   Number Minutes Moist Heat 15 Minutes   Moist Heat Location Shoulder  bil shoulders     Manual Therapy   Manual Therapy Soft tissue mobilization;Myofascial release   Soft tissue mobilization LT pectoral, RTC insertion                PT Education - 04/21/16 1129    Education provided Yes   Education Details 3 way raises, [place], rows, bil shoulder extension   Person(s) Educated Patient   Methods Explanation;Demonstration;Handout   Comprehension Verbalized understanding          PT Short Term Goals - 04/12/16 1105      PT SHORT TERM GOAL #4   Title demonstrate 4+/5 Rt shoulder strength to improve use   Time 4   Period Days   Status On-going     PT SHORT TERM GOAL #5   Title report > or  = to 50% use of Rt UE with self-care and ADLs   Status Achieved           PT Long Term Goals - 04/21/16 1129      PT LONG TERM GOAL #1   Title be independent in  advanced HEP   Status Achieved     PT LONG TERM GOAL #2   Title reduce FOTO to < or = to 55% limitation   Status Achieved     PT LONG TERM GOAL #3   Title demonstrate Lt shoulder P/ROM flexion to > or = to 110 degrees to improve use   Status Achieved     PT LONG TERM GOAL #4   Title demonstrate Lt shoulder P/ROM IR/ER to > or = to 45 degrees to improve use   Status Achieved     PT LONG TERM GOAL #5   Title demonstrate Rt shoulder IR to L2 to improve use with self-care   Status Achieved     PT LONG TERM GOAL #6   Title report < or = to 4/10 Rt and Lt shoulder pain with daily use   Status Achieved               Plan - 04/22/16 1130    Clinical Impression Statement Pt will be discharged to HEP today.  Pt with improved A/ROM of bil shoulders.  Pt with limited strength and will continue with HEP for continued gains.  Pt will follow-up with MD only as needed.     PT Next Visit Plan D/C PT to HEP   Consulted and Agree with Plan of Care Patient      Patient will benefit from skilled therapeutic intervention in order to improve the following deficits and impairments:     Visit Diagnosis: Acute pain of left shoulder  Muscle weakness (generalized)  Stiffness of right shoulder, not elsewhere classified  Stiffness of left shoulder, not elsewhere classified  Acute pain of right shoulder       G-Codes - 04-22-2016 1150    Functional Assessment Tool Used (Outpatient Only) FOTO: 41% limitation   Functional Limitation Other PT primary   Other PT Primary Goal Status (J4970) At least 40 percent but less than 60 percent impaired, limited or restricted   Other PT Primary Discharge Status (Y6378) At least 40 percent but less than 60 percent impaired, limited or restricted      Problem List Patient Active  Problem List   Diagnosis Date Noted  . History of palpitations 09/16/2015  . Pre-operative cardiovascular examination 09/16/2015  . Bipolar 1 disorder ([place]) 09/16/2015  . Palpitations 08/28/2014  . Hyperlipidemia LDL goal <70 11/26/2013  . History of breast cancer 11/16/2012  . Fibromyalgia 11/16/2012  . History of tobacco abuse 11/16/2012  . MRSA 04/13/2010  . INTERTRIGO 04/13/2010  . GROUP B STREPTOCOCCUS INFECTION, HX OF 04/13/2010  . HYPERLIPIDEMIA 09/02/2006  . DEPRESSION 09/02/2006  . Essential hypertension 09/02/2006  . COPD 09/02/2006  . GERD 09/02/2006   PHYSICAL THERAPY DISCHARGE SUMMARY  Visits from Start of Care: 24  Current functional level related to goals / functional outcomes: See above for current status.     Remaining deficits: See above.     Education / Equipment: HEP Plan: Patient agrees to discharge.  Patient goals were met. Patient is being discharged due to meeting the stated rehab goals.  ?????         Sigurd Sos, PT 04/22/16 11:51 AM  [facility] [address] [place], [place], [address] Phone: [phone]   Fax:  [phone]  Name: Anita Arroyo MRN: [id number] Date of Birth: [birth year]

## 2016-04-21 NOTE — Patient Instructions (Addendum)
Red band and work to green band.  2x10, 2x/day.  Strengthening: Resisted Internal Rotation   Hold tubing in left hand, elbow at side and forearm out. Rotate forearm in across body. Repeat ____ times per set. Do ____ sets per session. Do ____ sessions per day.  http://orth.exer.us/830   Copyright  VHI. All rights reserved.  Strengthening: Resisted External Rotation   Hold tubing in right hand, elbow at side and forearm across body. Rotate forearm out. Repeat ____ times per set. Do ____ sets per session. Do ____ sessions per day.  http://orth.exer.us/828   Copyright  VHI. All rights reserved.  Strengthening: Resisted Flexion   Hold tubing with left arm at side. Pull forward and up. Move shoulder through pain-free range of motion. Repeat ____ times per set. Do ____ sets per session. Do ____ sessions per day.  http://orth.exer.us/824   Copyright  VHI. All rights reserved.  Strengthening: Resisted Extension   Hold tubing in right hand, arm forward. Pull arm back, elbow straight. Repeat ____ times per set. Do ____ sets per session. Do ____ sessions per day.  http://orth.exer.us/832   Copyright  VHI. All rights reserved.  SHOULDER: Flexion Unilateral (Weight) Use 2-3 lbs.  2x10, 2x/day   Start with arm at side. Raise both arms forward and up to shoulder high  Keep elbows straight.  Use   lb weight.10  reps per set, 2-3___ sets per day.  Copyright  VHI. All rights reserved.  SHOULDER: Abduction (Weight)   Raise arm out and up. Keep elbow straight. Do not shrug shoulders. Use  # lb weight. _10__ reps per set, 2-_3__ sets per day.  Copyright  VHI. All rights reserved.  SHOULDER: Scaption (Weight)   Place arm at 45 angle to body. Raise arm up to shoulder level - shoulder keeping elbow straight.  Use   lb weight. _10__ reps per set, _2-3__ sets per day.  KEEP HEAD IN NEUTRAL AND SHOULDERS DOWN AND RELAXED   Hold tubing in right hand, arm forward. Pull arm  back, elbow straight. Repeat __10__ times per set. Do __2__ sets per session. Do _1-2___ sessions per day.  Copyright  VHI. All rights reserved.     With resistive band anchored in door, grasp both ends. Keeping elbows bent, pull back, squeezing shoulder blades together. Hold _3__ seconds. Repeat _2x10___ times. Do _1-2___ sessions per day.  http://[email or web address].exer.us/98   [facility] [address], [place], [place] [address] Phone # [phone] Fax [phone]

## 2016-05-03 ENCOUNTER — Ambulatory Visit (INDEPENDENT_AMBULATORY_CARE_PROVIDER_SITE_OTHER): Payer: Medicare HMO | Admitting: Physician Assistant

## 2016-05-03 ENCOUNTER — Encounter: Payer: Self-pay | Admitting: Physician Assistant

## 2016-05-03 VITALS — BP 116/76 | HR 76 | Resp 16 | Wt 175.4 lb

## 2016-05-03 DIAGNOSIS — M1990 Unspecified osteoarthritis, unspecified site: Secondary | ICD-10-CM | POA: Diagnosis not present

## 2016-05-03 DIAGNOSIS — J302 Other seasonal allergic rhinitis: Secondary | ICD-10-CM

## 2016-05-03 DIAGNOSIS — M797 Fibromyalgia: Secondary | ICD-10-CM | POA: Diagnosis not present

## 2016-05-03 DIAGNOSIS — F17200 Nicotine dependence, unspecified, uncomplicated: Secondary | ICD-10-CM | POA: Diagnosis not present

## 2016-05-03 DIAGNOSIS — Z7689 Persons encountering health services in other specified circumstances: Secondary | ICD-10-CM | POA: Diagnosis not present

## 2016-05-03 MED ORDER — HYDROCODONE-ACETAMINOPHEN 5-325 MG PO TABS: 1.0000 | ORAL_TABLET | Freq: Four times a day (QID) | ORAL | 0 refills | Status: AC | PRN

## 2016-05-03 MED ORDER — MONTELUKAST SODIUM 10 MG PO TABS: 10.0000 mg | ORAL_TABLET | Freq: Every day | ORAL | 3 refills | Status: DC

## 2016-05-03 NOTE — Patient Instructions (Addendum)
     IF you received an x-ray today, you will receive an invoice from [facility]. Please contact [facility] at [phone] with questions or concerns regarding your invoice.   IF you received labwork today, you will receive an invoice from [facility]. Please contact [facility] at [phone] with questions or concerns regarding your invoice.   Our billing staff will not be able to assist you with questions regarding bills from these companies.  You will be contacted with the lab results as soon as they are available. The fastest way to get your results is to activate your My Chart account. Instructions are located on the last page of this paperwork. If you have not heard from us regarding the results in 2 weeks, please contact this office.    Did you know that you begin to benefit from quitting smoking within the first twenty minutes? It's TRUE.  At 20 minutes: -blood pressure decreases -pulse rate drops -body temperature of hands and feet increases  At 8 hours: -carbon monoxide level in blood drops to normal -oxygen level in blood increases to normal  At 24 hours: -the chance of heart attack decreases  At 48 hours: -nerve endings start regrowing -ability to smell and taste is enhanced  2 weeks-3 months: -circulation improves -walking becomes easier -lung function improves  1-9 months: -coughing, sinus congestion, fatigue and shortness of breath decreases  1 year: -excess risk of heart disease is decreased to HALF that of a smoker  5 years: Stroke risk is reduced to that of people who have never smoked  10 years: -risk of lung cancer drops to as little as half that of continuing smokers -risk of cancer of the mouth, throat, esophagus, bladder, kidney and pancreas decreases -risk of ulcer decreases  15 years -risk of heart disease is now similar to that of people who have never smoked -risk of death returns to nearly the level of people who have  never smoked   
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
## 2016-05-03 NOTE — Progress Notes (Signed)
Patient ID: Anita Arroyo, female    DOB: 1960/05/28, 56 y.o.   MRN: [id number]  PCP: Reginia Forts, MD  Chief Complaint  Patient presents with  . Follow-up    med refills    Subjective:   Presents for medication refills and to establish with new PCP. Apparently, Dr. Thompson Caul name was placed on her chart for insurance purposes, but she has not ever seen Dr. Tamala Julian.  Needs refill of opiate and treatment of allergies not adequately controlled on her current treatment.  Allergies are not well controlled on her current regimen of cetirizine 10 mg daily and Flonase daily. She continues to experience nasal congestion, runny nose, sinus pressure and cough productive of clear-white-yellow sputum.  Has been seeing orthopedics for chronic pain in her muscles and joints. Didn't see a PCP while she was seeing orthopedics regularly. Previously saw a provider at [facility].  Uses opiates to address headache, muscle pain, fibromyalgia and osteoarthritis.  Started atorvastatin about three weeks ago. Notes adverse effects from atorvastatin. Increased belching. Loud abdominal sounds. Pantoprazole generally works for her reflux symptoms, but not adequately since starting the atorvastatin.  Smokes, and "has tried everything" to quit without success.  Psychitry: Dr. Adele Schilder Q3 months. Cardiology: Dr. Claiborne Billings 06/11/2016 8:20 am. Fasting labs planned then. Orthopedics: Dr. Percell Miller Oncology: Dr. Jana Hakim    Review of Systems Constitutional: Negative for activity change, appetite change, fatigueand unexpected weight change.  HENT: Negative for congestion, dental problem, ear pain, hearing loss, mouth sores, postnasal drip, rhinorrhea, sneezing, sore throat, tinnitusand trouble swallowing.  Eyes: Negative for photophobia, pain, rednessand visual disturbance.  Respiratory: Negative for cough, chest tightnessand shortness of breath.  Cardiovascular: Negative for chest pain, palpitationsand leg swelling.    Gastrointestinal: Negative for abdominal pain, blood in stool, constipation, diarrhea, nauseaand vomiting.  Genitourinary: Negative for dysuria, frequency, hematuriaand urgency.  Musculoskeletal: Negative for gait problem, and neck stiffness.  Skin: Negative for rash.  Neurological: Negative for dizziness, speech difficulty, weakness, light-headedness, numbnessand headaches.  Hematological: Negative for adenopathy.  Psychiatric/Behavioral: Negative for confusionand sleep disturbance. The patient is not nervous/anxious.     Patient Active Problem List   Diagnosis Date Noted  . Seasonal allergies 05/03/2016  . History of palpitations 09/16/2015  . Bipolar 1 disorder ([place]) 09/16/2015  . History of breast cancer 11/16/2012  . Fibromyalgia 11/16/2012  . Smoker 11/16/2012  . INTERTRIGO 04/13/2010  . HYPERLIPIDEMIA 09/02/2006  . DEPRESSION 09/02/2006  . Essential hypertension 09/02/2006  . COPD 09/02/2006  . GERD 09/02/2006     Prior to Admission medications   Medication Sig Start Date End Date Taking? Authorizing Provider  atenolol (TENORMIN) 50 MG tablet Take 50 mg by mouth 2 (two) times daily.   Yes Historical Provider, MD  Biotin (BIOTIN 5000) 5 MG CAPS Take 5 mg by mouth daily.   Yes Historical Provider, MD  cetirizine (ZYRTEC) 10 MG tablet Take 10 mg by mouth daily.    Yes Historical Provider, MD  fluticasone (FLONASE) 50 MCG/ACT nasal spray Place 1 spray into both nostrils 2 (two) times daily.    Yes Historical Provider, MD  Fluticasone-Salmeterol (ADVAIR) 250-50 MCG/DOSE AEPB Inhale 1 puff into the lungs 2 (two) times daily.   Yes Historical Provider, MD  HYDROcodone-acetaminophen (NORCO/VICODIN) 5-325 MG tablet Take 1-2 tablets by mouth every 6 hours as needed for pain and/or cough. 03/13/16  Yes Nicole Pisciotta, PA-C  lamoTRIgine (LAMICTAL) 150 MG tablet Take 1 tablet (150 mg total) by mouth 2 (two) times daily. 02/19/16  Yes Kathlee Nations, MD  Magnesium 250 MG TABS Take  250 mg by mouth daily.   Yes Historical Provider, MD  pantoprazole (PROTONIX) 40 MG tablet Take 40 mg by mouth daily.    Yes Historical Provider, MD  traZODone (DESYREL) 100 MG tablet Take 2 tablets (200 mg total) by mouth at bedtime. 02/19/16  Yes Kathlee Nations, MD  triamterene-hydrochlorothiazide (MAXZIDE-25) 37.5-25 MG tablet Take 1 tablet by mouth daily.   Yes Historical Provider, MD  venlafaxine XR (EFFEXOR-XR) 150 MG 24 hr capsule Take 150 mg by mouth at bedtime.   Yes Historical Provider, MD  oxyCODONE-acetaminophen (PERCOCET/ROXICET) 5-325 MG tablet Take 1 tablet by mouth every 4 (four) hours as needed for severe pain.     Historical Provider, MD     Allergies  Allergen Reactions  . Contrast Media [Iodinated Diagnostic Agents] Shortness Of Breath  . Iodine Shortness Of Breath  . Adhesive [Tape] Other (See Comments)    Reaction:  Blisters   . Amoxicillin-Pot Clavulanate Rash and Other (See Comments)    Has patient had a PCN reaction causing immediate rash, facial/tongue/throat swelling, SOB or lightheadedness with hypotension: No Has patient had a PCN reaction causing severe rash involving mucus membranes or skin necrosis: No Has patient had a PCN reaction that required hospitalization No Has patient had a PCN reaction occurring within the last 10 years: No If all of the above answers are "NO", then may proceed with Cephalosporin use.  Blair Dolphin [Cefaclor] Rash  . Moxifloxacin Rash  . Sulfonamide Derivatives Nausea And Vomiting       Objective:  Physical Exam  Constitutional: She is oriented to person, place, and time. She appears well-developed and well-nourished. She is active and cooperative. No distress.  BP 116/76   Pulse 76   Resp 16   Wt 175 lb 6.4 oz (79.6 kg)   LMP 06/25/2011   SpO2 98%   BMI 31.07 kg/m   HENT:  Head: Normocephalic and atraumatic.  Right Ear: Hearing normal.  Left Ear: Hearing normal.  Eyes: Conjunctivae are normal. No scleral icterus.  Neck:  Normal range of motion. Neck supple. No thyromegaly present.  Cardiovascular: Normal rate, regular rhythm and normal heart sounds.   Pulses:      Radial pulses are 2+ on the right side, and 2+ on the left side.  Pulmonary/Chest: Effort normal and breath sounds normal.  Lymphadenopathy:       Head (right side): No tonsillar, no preauricular, no posterior auricular and no occipital adenopathy present.       Head (left side): No tonsillar, no preauricular, no posterior auricular and no occipital adenopathy present.    She has no cervical adenopathy.       Right: No supraclavicular adenopathy present.       Left: No supraclavicular adenopathy present.  Neurological: She is alert and oriented to person, place, and time. No sensory deficit.  Skin: Skin is warm, dry and intact. No rash noted. No cyanosis or erythema. Nails show no clubbing.  Psychiatric: She has a normal mood and affect. Her speech is normal and behavior is normal.           Assessment & Plan:   Problem List Items Addressed This Visit    Fibromyalgia    Not on NSAIDS, but uncertain why. Se does have GERD, and is stable on PPI. Will review records. Continue venlafaxine and trazodone.      Smoker    Encouraged smoking cessation.  Seasonal allergies    Continue cetirizine and Flonase. Add montelukast.      Relevant Medications   montelukast (SINGULAIR) 10 MG tablet   Osteoarthritis    Not on NSAIDS, but uncertain why. Se does have GERD, and is stable on PPI. Will review records.      Relevant Medications   HYDROcodone-acetaminophen (NORCO/VICODIN) 5-325 MG tablet    Other Visit Diagnoses    Encounter to establish care    -  Primary   Will request records from her previous PCP and orthopedic surgeon.        Return in about 4 weeks (around 05/31/2016).   Fara Chute, PA-C [facility]

## 2016-05-10 ENCOUNTER — Telehealth: Payer: Self-pay | Admitting: Physician Assistant

## 2016-05-10 MED ORDER — FLUTICASONE PROPIONATE 50 MCG/ACT NA SUSP: 1.0000 | Freq: Two times a day (BID) | NASAL | 1 refills | Status: DC

## 2016-05-10 NOTE — Telephone Encounter (Signed)
Pt needs mail order prescription for fluticasone sent to [facility]. Pt said she could be reached at [phone] if there were any questions concerning that.

## 2016-05-13 NOTE — Assessment & Plan Note (Signed)
Continue cetirizine and Flonase. Add montelukast.

## 2016-05-13 NOTE — Assessment & Plan Note (Signed)
Encouraged smoking cessation 

## 2016-05-16 DIAGNOSIS — M1990 Unspecified osteoarthritis, unspecified site: Secondary | ICD-10-CM | POA: Insufficient documentation

## 2016-05-16 NOTE — Assessment & Plan Note (Signed)
Not on NSAIDS, but uncertain why. Se does have GERD, and is stable on PPI. Will review records.

## 2016-05-16 NOTE — Assessment & Plan Note (Signed)
Not on NSAIDS, but uncertain why. Se does have GERD, and is stable on PPI. Will review records. Continue venlafaxine and trazodone.

## 2016-05-19 ENCOUNTER — Encounter (HOSPITAL_COMMUNITY): Payer: Self-pay | Admitting: Psychiatry

## 2016-05-19 ENCOUNTER — Ambulatory Visit (INDEPENDENT_AMBULATORY_CARE_PROVIDER_SITE_OTHER): Payer: Medicare HMO | Admitting: Psychiatry

## 2016-05-19 VITALS — BP 130/78 | HR 65 | Ht 62.5 in | Wt 174.0 lb

## 2016-05-19 DIAGNOSIS — F17210 Nicotine dependence, cigarettes, uncomplicated: Secondary | ICD-10-CM

## 2016-05-19 DIAGNOSIS — Z79899 Other long term (current) drug therapy: Secondary | ICD-10-CM | POA: Diagnosis not present

## 2016-05-19 DIAGNOSIS — F3162 Bipolar disorder, current episode mixed, moderate: Secondary | ICD-10-CM | POA: Diagnosis not present

## 2016-05-19 MED ORDER — LAMOTRIGINE 150 MG PO TABS: 150.0000 mg | ORAL_TABLET | Freq: Two times a day (BID) | ORAL | 0 refills | Status: DC

## 2016-05-19 MED ORDER — VENLAFAXINE HCL ER 150 MG PO CP24: 150.0000 mg | ORAL_CAPSULE | Freq: Every day | ORAL | 0 refills | Status: DC

## 2016-05-19 MED ORDER — TRAZODONE HCL 100 MG PO TABS: 200.0000 mg | ORAL_TABLET | Freq: Every day | ORAL | 0 refills | Status: DC

## 2016-05-19 NOTE — Progress Notes (Signed)
[place] MD/PA/NP OP Progress Note  05/19/2016 11:03 AM Anita Arroyo  MRN:  [id number]  Chief Complaint:  Subjective:  I was bitten by a cat and I have to get injection to prevent rabies.  I'm doing better now.  HPI: Anita Arroyo came for her follow-up appointment.  She is taking her medication Effexor, Lamictal and trazodone and denies any side effects.  Patient mentioned 2 months ago she was bitten by a cat and she was getting injection to prevent rabies.  Patient told she was very nervous anxious but she has no choice.  She is very pleased that she is feeling much better.  She has no more pain.  She still have residual left shoulder pain status post surgery few months ago.  She has physical therapy and slowly and gradually improving.  Patient denies any irritability, anger, mania or any psychosis.  She sleeping good.  She established primary care physician at [facility] and she really liked her new physician.  Patient has no tremors, shakes, rash or itching.  She denies any suicidal thoughts or homicidal thought.  She does not want to change her medication.  Patient is not engaged in any self abusive behavior.  She denies any crying spells.  Energy level is good.  Her appetite is okay.  Her vital signs are stable.  She has good support from her biological and adopted mother.  Her biological mother lives 2 hours away but available if she ever needed.  Visit Diagnosis:    ICD-9-CM ICD-10-CM   1. Bipolar 1 disorder, mixed, moderate (HCC) 296.62 F31.62 venlafaxine XR (EFFEXOR-XR) 150 MG 24 hr capsule     traZODone (DESYREL) 100 MG tablet     lamoTRIgine (LAMICTAL) 150 MG tablet    Past Psychiatric History: Reviewed. Patient has history of bipolar disorder. She had a history of mood swings, anger, impulsive behavior. She had tried Depakote, Prozac, Paxil, Zoloft and Topamax. She was seen at [facility] for more than 10 years but she was not happy there. Patient denies any history of physical, sexual or verbal abuse. She  was also diagnosed with ADD but a stimulant cause worsening of bipolar disorder. She had tried Ritalin and Adderall in the past.  Past Medical History:  Past Medical History:  Diagnosis Date  . ADHD (attention deficit hyperactivity disorder)   . Arthritis   . Asthma   . Bipolar 1 disorder ([place])   . Bipolar 1 disorder ([place])   . Breast cancer ([place])   . Depression   . Dysrhythmia    palpitations  . Dysrhythmia    atrial fibrillation  . Fibromyalgia   . GERD (gastroesophageal reflux disease)   . Heart murmur   . History of stress test 04/28/2011   showed normal perfusion without scar or ischemia  . Hx of echocardiogram 04/28/2011   showed normal systolic function with mild diastolic dysfunction,she had trace MR but did not have frank mitral valve prolapse demonstrated. She had mild pulmonary hypertension with an estimated RV systolic pressure at 34 mm.  . Hyperlipemia   . Meniere disease   . MVP (mitral valve prolapse)   . Neuromuscular disorder ([place])    fibromyalgia    Past Surgical History:  Procedure Laterality Date  . ANKLE SURGERY     left x4  . BREAST SURGERY  2011   lumpectomy right breast   . COLONOSCOPY WITH PROPOFOL N/A 02/25/2014   Procedure: COLONOSCOPY WITH PROPOFOL;  Surgeon: Garlan Fair, MD;  Location: [facility];  Service: Endoscopy;  Laterality: N/A;  . deviated septum    . ECTOPIC PREGNANCY SURGERY    . ELBOW SURGERY     right elbow  . EYE SURGERY     cataract surgery  . fibroid     2-3 fibroid adenomas removed  . JOINT REPLACEMENT  2011   right knee   . KNEE SURGERY     left knee  . SHOULDER ARTHROSCOPY WITH BICEPSTENOTOMY Right 09/25/2015   Procedure: SHOULDER ARTHROSCOPY WITH BICEPSTENOTOMY;  Surgeon: Ninetta Lights, MD;  Location: [place];  Service: Orthopedics;  Laterality: Right;  . SHOULDER ARTHROSCOPY WITH DISTAL CLAVICLE RESECTION Right 09/25/2015   Procedure: SHOULDER ARTHROSCOPY WITH DISTAL CLAVICLE RESECTION;   Surgeon: Ninetta Lights, MD;  Location: [place];  Service: Orthopedics;  Laterality: Right;  . SHOULDER ARTHROSCOPY WITH SUBACROMIAL DECOMPRESSION Right 09/25/2015   Procedure: RIGHT SHOULDER ARTHROSCOPY DEBRIDEMENT,ACROMIOPLASTY,DISTAL CLAVICAL EXCISION, RELEASE OF BICEPS TENDON;  Surgeon: Ninetta Lights, MD;  Location: [place];  Service: Orthopedics;  Laterality: Right;  . TMJ ARTHROPLASTY    . TONSILLECTOMY    . TOTAL SHOULDER ARTHROPLASTY Left 01/05/2016  . UTERINE FIBROID SURGERY     polups and fibroids removed   . WRIST SURGERY     left    Family Psychiatric History: Reviewed.  Family History:  Family History  Problem Relation Age of Onset  . Mitral valve prolapse Mother   . Heart Problems Mother   . Cancer Mother     Social History:  Social History   Social History  . Marital status: Single    Spouse name: N/A  . Number of children: N/A  . Years of education: N/A   Social History Main Topics  . Smoking status: Current Every Day Smoker    Packs/day: 1.50    Years: 40.00    Types: Cigarettes  . Smokeless tobacco: Never Used     Comment: Cannot afford chantix. Working on getting discount from drug compny  . Alcohol use 0.0 oz/week     Comment: occ  . Drug use: No  . Sexual activity: Not on file   Other Topics Concern  . Not on file   Social History Narrative  . No narrative on file    Allergies:  Allergies  Allergen Reactions  . Contrast Media [Iodinated Diagnostic Agents] Shortness Of Breath  . Iodine Shortness Of Breath  . Adhesive [Tape] Other (See Comments)    Reaction:  Blisters   . Amoxicillin-Pot Clavulanate Rash and Other (See Comments)    Has patient had a PCN reaction causing immediate rash, facial/tongue/throat swelling, SOB or lightheadedness with hypotension: No Has patient had a PCN reaction causing severe rash involving mucus membranes or skin necrosis: No Has patient had a PCN reaction that required  hospitalization No Has patient had a PCN reaction occurring within the last 10 years: No If all of the above answers are "NO", then Anita Arroyo proceed with Cephalosporin use.  Blair Dolphin [Cefaclor] Rash  . Moxifloxacin Rash  . Sulfonamide Derivatives Nausea And Vomiting    Metabolic Disorder Labs: Recent Results (from the past 2160 hour(s))  Blood culture (routine x 2)     Status: None   Collection Time: 03/13/16  5:15 PM  Result Value Ref Range   Specimen Description BLOOD RIGHT FOREARM    Special Requests BOTTLES DRAWN AEROBIC AND ANAEROBIC 5CC    Culture      NO GROWTH 5 DAYS Performed at [facility]  Hospital Lab, [place] [address] [place], [place] [address]    Report Status 03/18/2016 FINAL   CBC with Differential     Status: Abnormal   Collection Time: 03/13/16  5:20 PM  Result Value Ref Range   WBC 10.6 (H) 4.0 - 10.5 K/uL   RBC 4.87 3.87 - 5.11 MIL/uL   Hemoglobin 14.9 12.0 - 15.0 g/dL   HCT 43.4 36.0 - 46.0 %   MCV 89.1 78.0 - 100.0 fL   MCH 30.6 26.0 - 34.0 pg   MCHC 34.3 30.0 - 36.0 g/dL   RDW 13.4 11.5 - 15.5 %   Platelets 186 150 - 400 K/uL   Neutrophils Relative % 69 %   Neutro Abs 7.3 1.7 - 7.7 K/uL   Lymphocytes Relative 23 %   Lymphs Abs 2.4 0.7 - 4.0 K/uL   Monocytes Relative 8 %   Monocytes Absolute 0.9 0.1 - 1.0 K/uL   Eosinophils Relative 0 %   Eosinophils Absolute 0.0 0.0 - 0.7 K/uL   Basophils Relative 0 %   Basophils Absolute 0.0 0.0 - 0.1 K/uL  Basic metabolic panel     Status: Abnormal   Collection Time: 03/13/16  5:20 PM  Result Value Ref Range   Sodium 136 135 - 145 mmol/L   Potassium 3.5 3.5 - 5.1 mmol/L   Chloride 98 (L) 101 - 111 mmol/L   CO2 25 22 - 32 mmol/L   Glucose, Bld 82 65 - 99 mg/dL   BUN 9 6 - 20 mg/dL   Creatinine, Ser 0.91 0.44 - 1.00 mg/dL   Calcium 10.0 8.9 - 10.3 mg/dL   GFR calc non Af Amer >60 >60 mL/min   GFR calc Af Amer >60 >60 mL/min    Comment: (NOTE) The eGFR has been calculated using the CKD EPI equation. This calculation  has not been validated in all clinical situations. eGFR's persistently <60 mL/min signify possible Chronic Kidney Disease.    Anion gap 13 5 - 15  POC CBG, ED     Status: None   Collection Time: 03/27/16  2:48 PM  Result Value Ref Range   Glucose-Capillary 89 65 - 99 mg/dL   No results found for: HGBA1C, MPG No results found for: PROLACTIN Lab Results  Component Value Date   CHOL 144 11/22/2013   TRIG 80 11/22/2013   HDL 49 11/22/2013   CHOLHDL 2.9 11/22/2013   VLDL 16 11/22/2013   LDLCALC 79 11/22/2013     Current Medications: Current Outpatient Prescriptions  Medication Sig Dispense Refill  . atenolol (TENORMIN) 50 MG tablet Take 50 mg by mouth 2 (two) times daily.    . Biotin (BIOTIN 5000) 5 MG CAPS Take 5 mg by mouth daily.    . cetirizine (ZYRTEC) 10 MG tablet Take 10 mg by mouth daily.     . fluticasone (FLONASE) 50 MCG/ACT nasal spray Place 1 spray into both nostrils 2 (two) times daily. 16 g 1  . Fluticasone-Salmeterol (ADVAIR) 250-50 MCG/DOSE AEPB Inhale 1 puff into the lungs 2 (two) times daily.    Marland Kitchen HYDROcodone-acetaminophen (NORCO/VICODIN) 5-325 MG tablet Take 1 tablet by mouth every 6 (six) hours as needed for moderate pain. 120 tablet 0  . lamoTRIgine (LAMICTAL) 150 MG tablet Take 1 tablet (150 mg total) by mouth 2 (two) times daily. 180 tablet 0  . Magnesium 250 MG TABS Take 250 mg by mouth daily.    . montelukast (SINGULAIR) 10 MG tablet Take 1 tablet (10 mg total) by  mouth at bedtime. 30 tablet 3  . pantoprazole (PROTONIX) 40 MG tablet Take 40 mg by mouth daily.     . traZODone (DESYREL) 100 MG tablet Take 2 tablets (200 mg total) by mouth at bedtime. 180 tablet 0  . triamterene-hydrochlorothiazide (MAXZIDE-25) 37.5-25 MG tablet Take 1 tablet by mouth daily.    Marland Kitchen venlafaxine XR (EFFEXOR-XR) 150 MG 24 hr capsule Take 150 mg by mouth at bedtime.     No current facility-administered medications for this visit.     Neurologic: Headache: No Seizure:  No Paresthesias: No  Musculoskeletal: Strength & Muscle Tone: within normal limits Gait & Station: normal Patient leans: N/A  Psychiatric Specialty Exam: Review of Systems  Musculoskeletal:       Left shoulder pain    Blood pressure 130/78, pulse 65, height 5' 2.5" (1.588 m), weight 174 lb (78.9 kg), last menstrual period 06/25/2011.Body mass index is 31.32 kg/m.  General Appearance: Casual  Eye Contact:  Good  Speech:  Clear and Coherent  Volume:  Normal  Mood:  Euthymic  Affect:  Appropriate  Thought Process:  Goal Directed  Orientation:  Full (Time, Place, and Person)  Thought Content: WDL and Logical   Suicidal Thoughts:  No  Homicidal Thoughts:  No  Memory:  Immediate;   Good Recent;   Good Remote;   Good  Judgement:  Good  Insight:  Good  Psychomotor Activity:  Normal  Concentration:  Concentration: Good and Attention Span: Good  Recall:  Good  Fund of Knowledge: Good  Language: Good  Akathisia:  No  Handed:  Right  AIMS (if indicated):  0  Assets:  Communication Skills Desire for Improvement Housing Social Support  ADL's:  Intact  Cognition: WNL  Sleep:  good    Assessment: Bipolar disorder type I.  Plan: Patient is a stable on her current psychiatric medication.  I review her psychosocial stressors, current medication and recent blood work results.  Patient has no rash, itching, tremors or any shakes.  Continue Lamictal 200 mg daily, Effexor XR 150 mg daily and trazodone 300 mg at bedtime.  Discussed medication side effects and benefits.  Recommended to call us back if she has any question, concern or if she feels worsening of the symptoms.  Follow-up in 3 months. Natalin Bible T., MD 05/19/2016, 11:03 AM

## 2016-05-27 ENCOUNTER — Encounter: Payer: Self-pay | Admitting: Physician Assistant

## 2016-05-27 DIAGNOSIS — D126 Benign neoplasm of colon, unspecified: Secondary | ICD-10-CM

## 2016-05-27 DIAGNOSIS — Z853 Personal history of malignant neoplasm of breast: Secondary | ICD-10-CM

## 2016-05-27 DIAGNOSIS — G894 Chronic pain syndrome: Secondary | ICD-10-CM

## 2016-05-27 DIAGNOSIS — M1990 Unspecified osteoarthritis, unspecified site: Secondary | ICD-10-CM

## 2016-05-27 NOTE — Progress Notes (Unsigned)
Records from [place] and [facility] received, reviewed and abstracted.

## 2016-05-28 DIAGNOSIS — G43809 Other migraine, not intractable, without status migrainosus: Secondary | ICD-10-CM | POA: Diagnosis not present

## 2016-05-28 DIAGNOSIS — H43393 Other vitreous opacities, bilateral: Secondary | ICD-10-CM | POA: Diagnosis not present

## 2016-05-28 DIAGNOSIS — H524 Presbyopia: Secondary | ICD-10-CM | POA: Diagnosis not present

## 2016-05-28 DIAGNOSIS — Z961 Presence of intraocular lens: Secondary | ICD-10-CM | POA: Diagnosis not present

## 2016-05-28 DIAGNOSIS — H04123 Dry eye syndrome of bilateral lacrimal glands: Secondary | ICD-10-CM | POA: Diagnosis not present

## 2016-06-01 ENCOUNTER — Ambulatory Visit: Payer: Medicare HMO | Admitting: Physician Assistant

## 2016-06-08 ENCOUNTER — Encounter: Payer: Self-pay | Admitting: Physician Assistant

## 2016-06-08 ENCOUNTER — Ambulatory Visit (INDEPENDENT_AMBULATORY_CARE_PROVIDER_SITE_OTHER): Payer: Medicare HMO | Admitting: Physician Assistant

## 2016-06-08 VITALS — BP 116/68 | HR 73 | Temp 98.3°F | Resp 18 | Ht 67.72 in | Wt 172.0 lb

## 2016-06-08 DIAGNOSIS — G894 Chronic pain syndrome: Secondary | ICD-10-CM | POA: Diagnosis not present

## 2016-06-08 DIAGNOSIS — M1990 Unspecified osteoarthritis, unspecified site: Secondary | ICD-10-CM | POA: Diagnosis not present

## 2016-06-08 DIAGNOSIS — J449 Chronic obstructive pulmonary disease, unspecified: Secondary | ICD-10-CM

## 2016-06-08 DIAGNOSIS — E785 Hyperlipidemia, unspecified: Secondary | ICD-10-CM

## 2016-06-08 MED ORDER — HYDROCODONE-ACETAMINOPHEN 5-325 MG PO TABS: ORAL_TABLET | ORAL | 0 refills | Status: DC

## 2016-06-08 MED ORDER — ALBUTEROL SULFATE HFA 108 (90 BASE) MCG/ACT IN AERS: 2.0000 | INHALATION_SPRAY | RESPIRATORY_TRACT | 1 refills | Status: DC | PRN

## 2016-06-08 NOTE — Assessment & Plan Note (Signed)
Exacerbated by increased allergy symptoms lately. Refilled albuterol. Continue Advair and AR treatments.

## 2016-06-08 NOTE — Assessment & Plan Note (Signed)
She sees cardiology in 3 days, and will defer select of next drug to them. Zetia may be an option, as it is now generic and may be covered by her prescription benefit plan.

## 2016-06-08 NOTE — Assessment & Plan Note (Addendum)
NCCSRS reviewed today and UDS performed. She signed a controlled substance contract today.  Continue current treatment and follow-up with orthopedics. She understands that Dr. Kathryne Hitch is retiring soon and she'll need to establish with a new orthopedic surgeon.  Review of records reveals that gabapentin and amitriptyline were suggested previously, but was deferred to her PCP. I do not have those records yet. We will request again.  Previous prescription for pregabalin, which she could not afford at the time (11/2010). Would consider trying again, as it may have better coverage with her plan.

## 2016-06-08 NOTE — Assessment & Plan Note (Signed)
Unclear what NSAIDS she has tried other than celecoxib (which she did not tolerate) and Pennsaid cream.   Follow-up with orthopedics.

## 2016-06-08 NOTE — Progress Notes (Signed)
Patient ID: Anita Arroyo, female    DOB: 1960/04/03, 56 y.o.   MRN: [id number]  PCP: Harrison Mons, PA-C  Chief Complaint  Patient presents with  . Follow-up    medication    Subjective:   Presents for refill of opiate for chronic pain.  The pain is due to OA of the knee, foot and shoulder, the effects of multiple orthopedic procedures, and fibromyalgia.  I have not yet received records from her previous PCP (Dr. Sheryn Bison at [place]). I have received and reviewed records from Orthopedics.  It appears that she has been on anti-inflammatories previously, though it's unclear which. I do see a course of Pennsaid cream, 05/09/2012, and multiple reference to "OTC anti-inflammatories" and "anti-inflammatories," but none are named. She recalls adverse reaction to celecoxib. The question is, can we reduce your pain by using an anti-inflammatory. "I can tell you that, the answer is no."  Last hydrocodone fill is the one I provided on 05/03/2016. (filled 4/10)  Opioid Risk Tool: 6 (moderate risk).  Has needed albuterol a couple of times recently, with increased allergy symptoms. Cannot locate her inhaler.  When she stopped the statin, her muscle aches improved significantly. When she restarted it, "I felt like I had the flu." Stopped again. Previously tolerated Zetia, but stopped it when it became unaffordable.  Wonders if it would be covered now. Sees cardiology on Friday 06/12/2016.   Review of Systems Pain in multiple locations. None new. Allergy symptoms.  No CP, HA, dizziness. No nausea, vomiting, diarrhea.     Patient Active Problem List   Diagnosis Date Noted  . 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 Chronic pain syndrome 05/27/2016  . Osteoarthritis 05/16/2016  . Seasonal allergies 05/03/2016  . History of palpitations 09/16/2015  . Bipolar 1 disorder ([place]) 09/16/2015  . History of breast cancer 11/16/2012  . Fibromyalgia 11/16/2012  . Smoker 11/16/2012    . INTERTRIGO 04/13/2010  . HYPERLIPIDEMIA 09/02/2006  . DEPRESSION 09/02/2006  . Essential hypertension 09/02/2006  . COPD 09/02/2006  . GERD 09/02/2006     Prior to Admission medications   Medication Sig Start Date End Date Taking? Authorizing Provider  atenolol (TENORMIN) 50 MG tablet Take 50 mg by mouth 2 (two) times daily.    [provider]  Biotin (BIOTIN 5000) 5 MG CAPS Take 5 mg by mouth daily.    [provider]  cetirizine (ZYRTEC) 10 MG tablet Take 10 mg by mouth daily.     [provider]  fluticasone (FLONASE) 50 MCG/ACT nasal spray Place 1 spray into both nostrils 2 (two) times daily. 05/10/16   Harrison Mons, PA-C  Fluticasone-Salmeterol (ADVAIR) 250-50 MCG/DOSE AEPB Inhale 1 puff into the lungs 2 (two) times daily.    [provider]  lamoTRIgine (LAMICTAL) 150 MG tablet Take 1 tablet (150 mg total) by mouth 2 (two) times daily. 05/19/16   Arfeen, Arlyce Harman, MD  Magnesium 250 MG TABS Take 250 mg by mouth daily.    [provider]  montelukast (SINGULAIR) 10 MG tablet Take 1 tablet (10 mg total) by mouth at bedtime. 05/03/16   Harrison Mons, PA-C  pantoprazole (PROTONIX) 40 MG tablet Take 40 mg by mouth daily.     [provider]  traZODone (DESYREL) 100 MG tablet Take 2 tablets (200 mg total) by mouth at bedtime. 05/19/16   Arfeen, Arlyce Harman, MD  triamterene-hydrochlorothiazide (MAXZIDE-25) 37.5-25 MG tablet Take 1 tablet by mouth daily.    [provider]  venlafaxine XR (EFFEXOR-XR) 150 MG 24 hr capsule Take 1 capsule (150 mg total) by mouth at bedtime. 05/19/16   Arfeen, Arlyce Harman, MD     Allergies  Allergen Reactions  . Contrast Media [Iodinated Diagnostic Agents] Shortness Of Breath  . Iodine Shortness Of Breath  . Adhesive [Tape] Other (See Comments)    Reaction:  Blisters   . Amoxicillin-Pot Clavulanate Rash and Other (See Comments)    Has patient had a PCN reaction causing immediate rash,  facial/tongue/throat swelling, SOB or lightheadedness with hypotension: No Has patient had a PCN reaction causing severe rash involving mucus membranes or skin necrosis: No Has patient had a PCN reaction that required hospitalization No Has patient had a PCN reaction occurring within the last 10 years: No If all of the above answers are "NO", then may proceed with Cephalosporin use.  Blair Dolphin [Cefaclor] Rash  . Moxifloxacin Rash  . Sulfonamide Derivatives Nausea And Vomiting       Objective:  Physical Exam  Constitutional: She is oriented to person, place, and time. She appears well-developed and well-nourished. She is active and cooperative. No distress.  BP 116/68   Pulse 73   Temp 98.3 F (36.8 C) (Oral)   Resp 18   Ht 5' 7.72" (1.72 m)   Wt 172 lb (78 kg)   LMP 06/25/2011   SpO2 95%   BMI 26.37 kg/m   HENT:  Head: Normocephalic and atraumatic.  Right Ear: Hearing normal.  Left Ear: Hearing normal.  Eyes: Conjunctivae are normal. No scleral icterus.  Neck: Normal range of motion. Neck supple. No thyromegaly present.  Cardiovascular: Normal rate, regular rhythm and normal heart sounds.   Pulses:      Radial pulses are 2+ on the right side, and 2+ on the left side.  Pulmonary/Chest: Effort normal and breath sounds normal.  Lymphadenopathy:       Head (right side): No tonsillar, no preauricular, no posterior auricular and no occipital adenopathy present.       Head (left side): No tonsillar, no preauricular, no posterior auricular and no occipital adenopathy present.    She has no cervical adenopathy.       Right: No supraclavicular adenopathy present.       Left: No supraclavicular adenopathy present.  Neurological: She is alert and oriented to person, place, and time. No sensory deficit.  Skin: Skin is warm, dry and intact. No rash noted. No cyanosis or erythema. Nails show no clubbing.  Psychiatric: She has a normal mood and affect. Her speech is normal and behavior is  normal.      Assessment & Plan:   Problem List Items Addressed This Visit    Hyperlipidemia    She sees cardiology in 3 days, and will defer select of next drug to them. Zetia may be an option, as it is now generic and may be covered by her prescription benefit plan.      COPD (chronic obstructive pulmonary disease) (HCC)    Exacerbated by increased allergy symptoms lately. Refilled albuterol. Continue Advair and AR treatments.      Relevant Medications   albuterol (PROVENTIL HFA;VENTOLIN HFA) 108 (90 Base) MCG/ACT inhaler   Osteoarthritis    Unclear what NSAIDS she has tried other than celecoxib (which she did not tolerate) and Pennsaid cream.   Follow-up with orthopedics.       Relevant Medications   HYDROcodone-acetaminophen (NORCO/VICODIN) 5-325 MG tablet   Chronic pain syndrome - Primary  NCCSRS reviewed today and UDS performed. She signed a controlled substance contract today.  Continue current treatment and follow-up with orthopedics. She understands that Dr. Kathryne Hitch is retiring soon and she'll need to establish with a new orthopedic surgeon.  Review of records reveals that gabapentin and amitriptyline were suggested previously, but was deferred to her PCP. I do not have those records yet. We will request again.  Previous prescription for pregabalin, which she could not afford at the time (11/2010). Would consider trying again, as it may have better coverage with her plan.      Relevant Medications   HYDROcodone-acetaminophen (NORCO/VICODIN) 5-325 MG tablet   Other Relevant Orders   ToxASSURE Select 13 (MW), Urine       Return in about 4 months (around 10/09/2016) for re-evaluation and update on pain.   Fara Chute, PA-C [facility] [place]

## 2016-06-08 NOTE — Patient Instructions (Signed)
     IF you received an x-ray today, you will receive an invoice from [facility]. Please contact [facility] at [phone] with questions or concerns regarding your invoice.   IF you received labwork today, you will receive an invoice from [facility]. Please contact [facility] at [phone] with questions or concerns regarding your invoice.   Our billing staff will not be able to assist you with questions regarding bills from these companies.  You will be contacted with the lab results as soon as they are available. The fastest way to get your results is to activate your My Chart account. Instructions are located on the last page of this paperwork. If you have not heard from us regarding the results in 2 weeks, please contact this office.     

## 2016-06-11 ENCOUNTER — Ambulatory Visit: Payer: Medicare HMO | Admitting: Cardiovascular Disease

## 2016-06-11 LAB — TOXASSURE SELECT 13 (MW), URINE

## 2016-06-22 ENCOUNTER — Encounter: Payer: Self-pay | Admitting: Cardiovascular Disease

## 2016-06-22 ENCOUNTER — Ambulatory Visit (INDEPENDENT_AMBULATORY_CARE_PROVIDER_SITE_OTHER): Payer: Medicare HMO | Admitting: Cardiovascular Disease

## 2016-06-22 VITALS — BP 106/62 | HR 70 | Ht 67.0 in | Wt 168.0 lb

## 2016-06-22 DIAGNOSIS — Z716 Tobacco abuse counseling: Secondary | ICD-10-CM

## 2016-06-22 DIAGNOSIS — E785 Hyperlipidemia, unspecified: Secondary | ICD-10-CM

## 2016-06-22 DIAGNOSIS — R0602 Shortness of breath: Secondary | ICD-10-CM

## 2016-06-22 DIAGNOSIS — I2510 Atherosclerotic heart disease of native coronary artery without angina pectoris: Secondary | ICD-10-CM

## 2016-06-22 DIAGNOSIS — I700 Atherosclerosis of aorta: Secondary | ICD-10-CM

## 2016-06-22 DIAGNOSIS — R002 Palpitations: Secondary | ICD-10-CM | POA: Diagnosis not present

## 2016-06-22 DIAGNOSIS — H8109 Meniere's disease, unspecified ear: Secondary | ICD-10-CM

## 2016-06-22 DIAGNOSIS — I10 Essential (primary) hypertension: Secondary | ICD-10-CM

## 2016-06-22 NOTE — Patient Instructions (Signed)
Your physician has requested that you have an echocardiogram. Echocardiography is a painless test that uses sound waves to create images of your heart. It provides your doctor with information about the size and shape of your heart and how well your heart's chambers and valves are working. This procedure takes approximately one hour. There are no restrictions for this procedure.  Your physician recommends that you return for lab work fasting.  Your physician has requested that you have a lexiscan myoview. For further information please visit [email or web address]. Please follow instruction sheet, as given.  Your physician recommends that you schedule a follow-up appointment in: 6 weeks

## 2016-06-22 NOTE — Progress Notes (Signed)
Patient ID: Anita Arroyo, female   DOB: 1961-01-08, 56 y.o.   MRN: [id number]     HPI: Anita Arroyo is a 56 y.o. female who presents for a 91 month cardiology evaluation.   I initially saw Anita Arroyo in March 2013 for evaluation of episodes of chest tightness with walking. She has a long-standing history of ongoing tobacco use, and reportedly it had a history of possible mitral valve prolapse. An echo Doppler study in April 2013 showed normal systolic function with grade 1 diastolic dysfunction. She had trace mitral regurgitation and frank mitral valve prolapse was not demonstrated. She did have mild pulmonary hypertension with estimated systolic pressure 34 mm. A nuclear perfusion study revealed normal perfusion.  She as ahistory of hyperlipidemia which was treated with Zetia. She has undergone left shoulder surgery by Dr. Percell Miller and had a ruptured by sepsis tendon. 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 Percell Miller, and history of bipolar polar disorder followed by Dr. Lovena Le as well as fibromyalgia.  Last year she lost approximately 21 pounds.  She has seen a nutritionist.  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  Unfortunately, she continues to smoke approximately 1-1/2ppd. She never had the prescription for Chantix filled.  She does have Mnire's disease for which she takes Dyazide.  She is on trazodone 200 mg at bedtime and also takes Lamictal 150 mg twice a day in addition to Effexor XR for her bipolar history.  She continues to take tamoxifen with her history of breast CA.  She has a history of palpitations and had been taking atenolol 50 mg in the morning and 25 mg at night. Due to increasing palpitations atenolol was increased to  50 mg twice a day with improvement.  He  denies any presyncope or syncope.    In 2016 a comprehensive metabolic panel, CBC, thyroid function studies were all entirely normal.  Lipid studies were excellent with a total cholesterol 144, triglycerides 80, HDL 49, LDL 79.  She will be establishing with a new primary care physician.  Since I last saw her in August 2016.  She has been fairly well-controlled with palpitations.  She was seen in August 2017 for preoperative clearance prior to undergoing shoulder surgery which was done by Dr. Percell Miller.  She tolerated her surgery well without cardiovascular compromise.  In February 2018, she was bitten by a cat in her leg and developed infection and cellulitis requiring antibiotic therapy.  In March 2018, she underwent a screening chest CT in light of her long-standing tobacco history.  This suggested atherosclerosis involving her left circumflex coronary artery and atherosclerosis in a nonaneurysmal thoracic aorta.  She continues to smoke cigarettes and has been smoking 1-1-1/2 packs per day, having started at age 59.  Since I last saw her.  She also had stopped taking Lipitor.  At times she notes some vague chest pain with activity  and shortness of breath.  She presents for evaluation.    Past Medical History:  Diagnosis Date  . ADHD (attention deficit hyperactivity disorder)   . Arthritis   . Asthma   . Bipolar 1 disorder ([place])   . Bipolar 1 disorder ([place])   . Breast cancer ([place])   . Depression   . Dysrhythmia  palpitations  . Dysrhythmia    atrial fibrillation  . Fibromyalgia   . Finger fracture 2011  . GERD (gastroesophageal reflux disease)   . Heart murmur   . History of stress test 04/28/2011   showed normal perfusion without scar or ischemia  . Hx of echocardiogram 04/28/2011   showed normal systolic function with mild diastolic dysfunction,she had trace MR but did not have frank mitral valve prolapse demonstrated. She had mild pulmonary hypertension with an estimated RV systolic  pressure at 34 mm.  . Hyperlipemia   . Meniere disease   . MVP (mitral valve prolapse)   . Neuromuscular disorder ([place])    fibromyalgia    Past Surgical History:  Procedure Laterality Date  . ANKLE SURGERY     left x4  . BREAST SURGERY  2011   lumpectomy right breast   . COLONOSCOPY WITH PROPOFOL N/A 02/25/2014   Procedure: COLONOSCOPY WITH PROPOFOL;  Surgeon: Garlan Fair, MD;  Location: [facility];  Service: Endoscopy;  Laterality: N/A;  . deviated septum    . ECTOPIC PREGNANCY SURGERY    . ELBOW SURGERY     right elbow  . EYE SURGERY     cataract surgery  . fibroid     2-3 fibroid adenomas removed  . JOINT REPLACEMENT  2011   right knee   . KNEE SURGERY     left knee  . SHOULDER ARTHROSCOPY WITH BICEPSTENOTOMY Right 09/25/2015   Procedure: SHOULDER ARTHROSCOPY WITH BICEPSTENOTOMY;  Surgeon: Ninetta Lights, MD;  Location: [facility];  Service: Orthopedics;  Laterality: Right;  . SHOULDER ARTHROSCOPY WITH DISTAL CLAVICLE RESECTION Right 09/25/2015   Procedure: SHOULDER ARTHROSCOPY WITH DISTAL CLAVICLE RESECTION;  Surgeon: Ninetta Lights, MD;  Location: [facility];  Service: Orthopedics;  Laterality: Right;  . SHOULDER ARTHROSCOPY WITH SUBACROMIAL DECOMPRESSION Right 09/25/2015   Procedure: RIGHT SHOULDER ARTHROSCOPY DEBRIDEMENT,ACROMIOPLASTY,DISTAL CLAVICAL EXCISION, RELEASE OF BICEPS TENDON;  Surgeon: Ninetta Lights, MD;  Location: [facility];  Service: Orthopedics;  Laterality: Right;  . TMJ ARTHROPLASTY    . TONSILLECTOMY    . TOTAL SHOULDER ARTHROPLASTY Left 01/05/2016  . UTERINE FIBROID SURGERY     polups and fibroids removed   . WRIST SURGERY     left    Allergies  Allergen Reactions  . Contrast Media [Iodinated Diagnostic Agents] Shortness Of Breath  . Iodine Shortness Of Breath  . Adhesive [Tape] Other (See Comments)    Reaction:  Blisters   . Celecoxib     "I climb the walls"  . Statins     Body aches  .  Ceclor [Cefaclor] Rash  . Moxifloxacin Rash  . Sulfonamide Derivatives Nausea And Vomiting    Current Outpatient Prescriptions  Medication Sig Dispense Refill  . albuterol (PROVENTIL HFA;VENTOLIN HFA) 108 (90 Base) MCG/ACT inhaler Inhale 2 puffs into the lungs every 4 (four) hours as needed for wheezing or shortness of breath (cough, shortness of breath or wheezing.). 1 Inhaler 1  . atenolol (TENORMIN) 50 MG tablet Take 50 mg by mouth 2 (two) times daily.    . Biotin (BIOTIN 5000) 5 MG CAPS Take 5 mg by mouth daily.    . cetirizine (ZYRTEC) 10 MG tablet Take 10 mg by mouth daily.     . fluticasone (FLONASE) 50 MCG/ACT nasal spray Place 1 spray into both nostrils 2 (two) times daily. 16 g 1  . Fluticasone-Salmeterol (ADVAIR) 250-50 MCG/DOSE AEPB Inhale 1 puff into the lungs 2 (two)  times daily.    Marland Kitchen HYDROcodone-acetaminophen (NORCO/VICODIN) 5-325 MG tablet take 1 TABLET BY MOUTH EVERY 6 HOURS AS NEEDED for moderate pain 120 tablet 0  . lamoTRIgine (LAMICTAL) 150 MG tablet Take 1 tablet (150 mg total) by mouth 2 (two) times daily. 180 tablet 0  . Magnesium 250 MG TABS Take 250 mg by mouth daily.    . montelukast (SINGULAIR) 10 MG tablet Take 1 tablet (10 mg total) by mouth at bedtime. 30 tablet 3  . pantoprazole (PROTONIX) 40 MG tablet Take 40 mg by mouth daily.     . traZODone (DESYREL) 100 MG tablet Take 2 tablets (200 mg total) by mouth at bedtime. 180 tablet 0  . triamterene-hydrochlorothiazide (MAXZIDE-25) 37.5-25 MG tablet Take 1 tablet by mouth daily.    Marland Kitchen venlafaxine XR (EFFEXOR-XR) 150 MG 24 hr capsule Take 1 capsule (150 mg total) by mouth at bedtime. 90 capsule 0   No current facility-administered medications for this visit.     Social History   Social History  . Marital status: Single    Spouse name: N/A  . Number of children: N/A  . Years of education: N/A   Occupational History  . Not on file.   Social History Main Topics  . Smoking status: Current Every Day Smoker     Packs/day: 1.50    Years: 40.00    Types: Cigarettes  . Smokeless tobacco: Never Used     Comment: Cannot afford chantix. Working on getting discount from drug compny  . Alcohol use 0.0 oz/week     Comment: occ  . Drug use: No  . Sexual activity: Not on file   Other Topics Concern  . Not on file   Social History Narrative  . No narrative on file    Family History  Problem Relation Age of Onset  . Mitral valve prolapse Mother   . Heart Problems Mother   . Cancer Mother    Socially she is single. She has no children. She has been smoking for approximately 40 years and still smokes 1-11/2 pack per day per. 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   ROS General: Negative; No fevers, chills, or night sweats;  HEENT: Negative; No changes in vision or hearing, sinus congestion, difficulty swallowing Pulmonary: Positive for mild shortness of breath with activity Cardiovascular: Negative; No chest pain, presyncope, syncope, palpitations GI: Negative; No nausea, vomiting, diarrhea, or abdominal pain GU: Negative; No dysuria, hematuria, or difficulty voiding Musculoskeletal: Negative; no myalgias, joint pain, or weakness Hematologic/Oncology: Negative; no easy bruising, bleeding Endocrine: Negative; no heat/cold intolerance; no diabetes Neuro: Negative; no changes in balance, headaches Skin: Negative; No rashes or skin lesions Psychiatric: Negative; No behavioral problems, depression Sleep: Negative; No snoring, daytime sleepiness, hypersomnolence, bruxism, restless legs, hypnogognic hallucinations, no cataplexy Other comprehensive 14 point system review is negative.   PE BP 106/62   Pulse 70   Ht _0  (1.702 m)   Wt 168 lb (76.2 kg)   LMP 06/25/2011   BMI 26.31 kg/m    Repeat blood pressure by me was 112/64.    Wt Readings from Last 3 Encounters:  06/22/16 168 lb (76.2 kg)  06/08/16 172 lb (78 kg)  05/03/16 175 lb 6.4 oz (79.6 kg)   General: Alert,  oriented, no distress.  Skin: normal turgor, no rashes HEENT: Normocephalic, atraumatic. Pupils round and reactive; sclera anicteric;no lid lag.  Positive for Mnire's disease treated with Dyazide Nose without nasal septal hypertrophy  Mouth/Parynx benign; Mallinpatti scale 3 Neck: No JVD, no carotid bruits Lungs: clear to ausculatation and percussion; no wheezing or rales Chest wall: no costochondral discomfort Heart: RRR, s1 s2 normal, no s3 gallop 1/6 systolic murmur; no audible click..  No diastolic murmur.  No rubs thrills or heaves. No ectopy. Abdomen: soft, nontender; no hepatosplenomehaly, BS+; abdominal aorta nontender and not dilated by palpation. Back: No CVA tenderness Pulses 2+ Extremities: no clubbing cyanosis or edema, Homan's sign negative  Neurologic: grossly nonfocal Psychologic: normal affect and mood.  ECG (independently read by me): Normal sinus rhythm at 70 bpm.  Borderline voltage criteria for LVH.  QTc interval 451 Anita.  PR interval normal.  Nose ST-T change.  August 2016 ECG (independently read by me): Normal sinus rhythm at 65 bpm.  Mild RV conduction delay.  No securing ST-T changes.  May 2015 ECG (independently read by me): Normal sinus rhythm at 67 bpm.  QTc interval 443 Anita.  Nonspecific T-wave changes.  October 2014 ECG: Sinus rhythm at 69 beats per minute. Mild RV conduction delay. Normal intervals.  LABS: BMP Latest Ref Rng & Units 03/13/2016 09/19/2015 08/20/2014  Glucose 65 - 99 mg/dL 82 87 92  BUN 6 - 20 mg/dL 9 9 16.0  Creatinine 0.44 - 1.00 mg/dL 0.91 0.85 0.9  Sodium 135 - 145 mmol/L 136 137 138  Potassium 3.5 - 5.1 mmol/L 3.5 4.2 4.7  Chloride 101 - 111 mmol/L 98(L) 101 -  CO2 22 - 32 mmol/L _0 Calcium 8.9 - 10.3 mg/dL 10.0 9.8 9.4   Hepatic Function Latest Ref Rng & Units 08/20/2014 11/22/2013 08/06/2013  Total Protein 6.4 - 8.3 g/dL 6.6 6.3 7.1  Albumin 3.5 - 5.0 g/dL 3.9 4.2 4.1  AST 5 - 34 U/L _1 ALT 0 - 55 U/L _2 Alk  Phosphatase 40 - 150 U/L 54 46 51  Total Bilirubin 0.20 - 1.20 mg/dL 0.47 0.5 0.46   CBC Latest Ref Rng & Units 03/13/2016 08/20/2014 11/22/2013  WBC 4.0 - 10.5 K/uL 10.6(H) 5.6 6.5  Hemoglobin 12.0 - 15.0 g/dL 14.9 13.7 14.3  Hematocrit 36.0 - 46.0 % 43.4 40.3 40.8  Platelets 150 - 400 K/uL 186 179 190   Lab Results  Component Value Date   MCV 89.1 03/13/2016   MCV 94.1 08/20/2014   MCV 91.7 11/22/2013   Lab Results  Component Value Date   TSH 2.129 11/22/2013  No results found for: HGBA1C  Lipid Panel     Component Value Date/Time   CHOL 144 11/22/2013 0904   TRIG 80 11/22/2013 0904   HDL 49 11/22/2013 0904   CHOLHDL 2.9 11/22/2013 0904   VLDL 16 11/22/2013 0904   LDLCALC 79 11/22/2013 0904     RADIOLOGY: Dg Chest 2 View  11/10/2012   CLINICAL DATA:  Cough and chest pain  EXAM: CHEST  2 VIEW  COMPARISON:  July 30, 2012  FINDINGS: There is a calcified granuloma in the left lower lobe. Lungs are otherwise clear. Heart size and pulmonary vascularity are normal. No adenopathy. There are no appreciable bone lesions.  IMPRESSION: Granuloma left base. No edema or consolidation   Electronically Signed   By: Lowella Grip M.D.   On: 11/10/2012 15:22   Mr Shoulder Left Wo Contrast  11/12/2012   CLINICAL DATA:  Extreme shoulder pain for months. Prior shoulder surgery approximately 1 year ago.  EXAM: MRI OF THE LEFT SHOULDER WITHOUT CONTRAST  TECHNIQUE: Multiplanar, multisequence  MR imaging of the shoulder was performed. No intravenous contrast was administered.  COMPARISON:  Left shoulder MRI 06/04/2011.  FINDINGS: Rotator cuff: Interval development of infraspinous tendinosis with hyperintensity extending to the musculotendinous junction. No focal tear is demonstrated. There is stable mild supraspinatus and subscapularis tendinosis.  Muscles:  No focal muscular atrophy or edema.  Biceps long head: The intra-articular portion of the biceps tendon is now diminutive with irregular signal  consistent with tendinosis and partial tearing. No full-thickness tendon tear or tendon dislocation is identified.  Acromioclavicular Joint: The acromion is type 1. Patient has undergone interval distal clavicle resection and probable acromioplasty. There is no osseous encroachment on the rotator cuff passageway. There is no significant fluid in the subacromial -subdeltoid space.  Glenohumeral Joint: No significant shoulder joint effusion. The previously demonstrated edema and cystic changes superiorly in the glenoid have improved. However, there is new edema and subchondral cyst formation in the anterior inferior glenoid.  Labrum: There is labral degeneration without evidence of discrete tear.  Bones: No significant extra-articular osseous findings. The cystic changes previously noted medial to the lesser tuberosity have improved.  IMPRESSION: 1. Interval distal clavicle resection and acromioplasty. No rotator cuff impingement identified. 2. Infraspinous tendinosis without evidence of rotator cuff tear. 3. New diminution of the long head of the biceps tendon consistent with partial tearing. 4. Progressive glenohumeral degenerative changes with new subchondral cyst formation and edema in the anterior inferior glenoid. No discrete labral tear identified.   Electronically Signed   By: Camie Patience M.D.   On: 11/12/2012 12:14    IMPRESSION:  1. Essential hypertension   2. Hyperlipidemia with target LDL less than 70   3. SOB (shortness of breath) on exertion   4. Atherosclerosis of native coronary artery of native heart without angina pectoris   5. Atherosclerosis of aorta (H[facility])   6. Palpitations   7. Meniere's disease, unspecified laterality   8. Tobacco abuse counseling     ASSESSMENT AND PLAN: Anita Arroyo is a 56 year old female who has a 60+ pack year history of tobacco use.  He has a history of palpitations and recently these have been controlled with her current dose of atenolol 50 mg twice a day.  She  has a history of bipolar disorder for which she takes Effexor and doesn't real.  She is been undergoing CT imaging for surveillance for lung screening.  Her most recent study from 04/13/2016 showed a right lower lobe pulmonary nodule.  Incidentally, she was found to have atherosclerosis involving her left circumflex coronary artery as well as her thoracic aorta.  She admits to shortness of breath at times with activity and also has noticed some vague chest pain.  She has not had an echo Doppler study or nuclear perfusion study in over 5 years.  With her recent findings noted on CT imaging, I have recommended that she undergo a 2-D echo Doppler study as well as a Frederica study for further evaluation of both systolic and diastolic function and to make certain she does not have any significant ischemia in light of her atherosclerosis.  She previously had been on Lipitor and is no longer is taking Zetia and apparently stopped this due to cost.  With her atherosclerosis, aggressive lipid management is necessary with target LDL less than 70.  She is not had recent lab work and fasting laboratory will be obtained.  She will need to be started back on lipid-lowering therapy.  I had a long discussion concerning  the importance of smoking cessation.  Remotely, she had used Chantix and also has used nicotine patches.  She has GERD but this has been controlled with Protonix.  She also has a component of asthma for which she takes Advair as well as albuterol as needed.  I will contact her regarding her laboratory and medication adjustment will be made made.  I will see her back in the office for follow-up evaluation.   Troy Sine, MD, [facility]  06/24/2016 5:50 PM

## 2016-06-30 ENCOUNTER — Telehealth (HOSPITAL_COMMUNITY): Payer: Self-pay

## 2016-06-30 ENCOUNTER — Other Ambulatory Visit: Payer: Self-pay | Admitting: Physician Assistant

## 2016-06-30 NOTE — Telephone Encounter (Signed)
Encounter complete. 

## 2016-07-02 ENCOUNTER — Other Ambulatory Visit: Payer: Self-pay

## 2016-07-02 ENCOUNTER — Ambulatory Visit (HOSPITAL_COMMUNITY): Admission: RE | Admit: 2016-07-02 | Payer: Medicare HMO | Source: Ambulatory Visit

## 2016-07-02 MED ORDER — ATENOLOL 50 MG PO TABS: 50.0000 mg | ORAL_TABLET | Freq: Two times a day (BID) | ORAL | 3 refills | Status: DC

## 2016-07-06 ENCOUNTER — Other Ambulatory Visit: Payer: Self-pay

## 2016-07-07 ENCOUNTER — Telehealth (HOSPITAL_COMMUNITY): Payer: Self-pay | Admitting: Cardiovascular Disease

## 2016-07-08 ENCOUNTER — Other Ambulatory Visit: Payer: Self-pay

## 2016-07-08 ENCOUNTER — Ambulatory Visit (HOSPITAL_COMMUNITY): Payer: Medicare HMO | Attending: Internal Medicine

## 2016-07-08 DIAGNOSIS — J449 Chronic obstructive pulmonary disease, unspecified: Secondary | ICD-10-CM | POA: Insufficient documentation

## 2016-07-08 DIAGNOSIS — E785 Hyperlipidemia, unspecified: Secondary | ICD-10-CM | POA: Insufficient documentation

## 2016-07-08 DIAGNOSIS — I10 Essential (primary) hypertension: Secondary | ICD-10-CM | POA: Insufficient documentation

## 2016-07-08 DIAGNOSIS — R002 Palpitations: Secondary | ICD-10-CM | POA: Diagnosis not present

## 2016-07-08 DIAGNOSIS — I340 Nonrheumatic mitral (valve) insufficiency: Secondary | ICD-10-CM | POA: Insufficient documentation

## 2016-07-08 DIAGNOSIS — Z720 Tobacco use: Secondary | ICD-10-CM | POA: Insufficient documentation

## 2016-07-08 DIAGNOSIS — R0602 Shortness of breath: Secondary | ICD-10-CM

## 2016-07-08 NOTE — Telephone Encounter (Signed)
  07/07/2016 10:36 AM Phone (Outgoing) Anita Arroyo, Anita Arroyo (Self) [phone] (H)   Left Message - Called pt and lmsg for pt to CB to get scheduled formyoview.     By Verdene Rio

## 2016-07-12 ENCOUNTER — Telehealth: Payer: Self-pay | Admitting: Family Medicine

## 2016-07-12 DIAGNOSIS — G894 Chronic pain syndrome: Secondary | ICD-10-CM

## 2016-07-12 NOTE — Telephone Encounter (Signed)
Please advise Last ov 06/08/16

## 2016-07-12 NOTE — Telephone Encounter (Signed)
Anita Arroyo pt calling for her monthly refill on Hydrocodone

## 2016-07-13 ENCOUNTER — Telehealth: Payer: Self-pay

## 2016-07-13 MED ORDER — HYDROCODONE-ACETAMINOPHEN 5-325 MG PO TABS: ORAL_TABLET | ORAL | 0 refills | Status: DC

## 2016-07-13 NOTE — Telephone Encounter (Signed)
Meds ordered this encounter  Medications  . HYDROcodone-acetaminophen (NORCO/VICODIN) 5-325 MG tablet    Sig: take 1 TABLET BY MOUTH EVERY 6 HOURS AS NEEDED for moderate pain    Dispense:  120 tablet    Refill:  0    Order Specific Question:   Supervising Provider    Answer:   Brigitte Pulse, EVA N [[id number]]

## 2016-07-13 NOTE — Telephone Encounter (Signed)
Called pt and advised that rx was ready for pick up at [facility].

## 2016-07-15 NOTE — Telephone Encounter (Signed)
rx printed and at [facility] [address]. Pt aware.

## 2016-07-21 ENCOUNTER — Telehealth (HOSPITAL_COMMUNITY): Payer: Self-pay

## 2016-07-21 NOTE — Telephone Encounter (Signed)
Encounter complete. 

## 2016-07-23 ENCOUNTER — Ambulatory Visit (HOSPITAL_COMMUNITY)
Admission: RE | Admit: 2016-07-23 | Payer: Medicare HMO | Source: Ambulatory Visit | Attending: Cardiovascular Disease | Admitting: Cardiovascular Disease

## 2016-07-26 ENCOUNTER — Telehealth: Payer: Self-pay | Admitting: Physician Assistant

## 2016-07-26 MED ORDER — PANTOPRAZOLE SODIUM 40 MG PO TBEC: 40.0000 mg | DELAYED_RELEASE_TABLET | Freq: Every day | ORAL | 1 refills | Status: DC

## 2016-07-26 NOTE — Telephone Encounter (Signed)
Pt called back herself and asked for me.  Shes angry that [facility] has not received the faxes for her medication.  Says it was sent the 8th and the 20th of June.  Says she is suffering because of this.  Wanted to speak to a Freight forwarder and I gave it to [facility].

## 2016-07-26 NOTE — Telephone Encounter (Signed)
I see no request for this medication in her record. If her pharmacy is faxing the request, that may be the problem. They need to send the requests electronically. We do address faxed requests when received. In addition, pharmacies typically call us when they don't hear from [place], and there is no phone call documented in her record.  Meds ordered this encounter  Medications  . pantoprazole (PROTONIX) 40 MG tablet    Sig: Take 1 tablet (40 mg total) by mouth daily.    Dispense:  90 tablet    Refill:  1    Order Specific Question:   Supervising Provider    Answer:   Brigitte Pulse, EVA N [[id number]]

## 2016-07-26 NOTE — Telephone Encounter (Signed)
Pt's insurance called saying the patient was very upset that we have not sent in her script for timoprozale.  I do not see it in her med list.  They said they have sent in a request for this several days ago and will be sending another one today.  Call patient at [phone]

## 2016-07-27 ENCOUNTER — Other Ambulatory Visit: Payer: Self-pay | Admitting: Emergency Medicine

## 2016-07-27 DIAGNOSIS — G894 Chronic pain syndrome: Secondary | ICD-10-CM

## 2016-07-27 MED ORDER — PANTOPRAZOLE SODIUM 40 MG PO TBEC: 40.0000 mg | DELAYED_RELEASE_TABLET | Freq: Every day | ORAL | 1 refills | Status: DC

## 2016-07-27 NOTE — Telephone Encounter (Signed)
Spoke with patient regarding medication refill Protonix Medication was e-scribed to [facility] instead of [facility].  Pharmacy changed and patient made aware.

## 2016-07-27 NOTE — Telephone Encounter (Signed)
E-scribed on 07/26/16

## 2016-07-30 ENCOUNTER — Telehealth (HOSPITAL_COMMUNITY): Payer: Self-pay

## 2016-07-30 NOTE — Telephone Encounter (Signed)
Encounter complete. 

## 2016-08-03 ENCOUNTER — Ambulatory Visit (HOSPITAL_COMMUNITY): Payer: Medicare HMO

## 2016-08-03 ENCOUNTER — Ambulatory Visit: Payer: Medicare HMO | Admitting: Cardiovascular Disease

## 2016-08-10 ENCOUNTER — Telehealth (HOSPITAL_COMMUNITY): Payer: Self-pay

## 2016-08-10 NOTE — Telephone Encounter (Signed)
Encounter complete. 

## 2016-08-12 ENCOUNTER — Ambulatory Visit (HOSPITAL_COMMUNITY)
Admission: RE | Admit: 2016-08-12 | Discharge: 2016-08-12 | Disposition: A | Discharge status: Home / Self Care | Payer: Medicare HMO | Source: Ambulatory Visit | Attending: Cardiology | Admitting: Cardiology

## 2016-08-12 DIAGNOSIS — I10 Essential (primary) hypertension: Secondary | ICD-10-CM | POA: Insufficient documentation

## 2016-08-12 DIAGNOSIS — Z8249 Family history of ischemic heart disease and other diseases of the circulatory system: Secondary | ICD-10-CM | POA: Diagnosis not present

## 2016-08-12 DIAGNOSIS — F17200 Nicotine dependence, unspecified, uncomplicated: Secondary | ICD-10-CM | POA: Diagnosis not present

## 2016-08-12 DIAGNOSIS — Z853 Personal history of malignant neoplasm of breast: Secondary | ICD-10-CM | POA: Insufficient documentation

## 2016-08-12 DIAGNOSIS — R079 Chest pain, unspecified: Secondary | ICD-10-CM | POA: Insufficient documentation

## 2016-08-12 DIAGNOSIS — E785 Hyperlipidemia, unspecified: Secondary | ICD-10-CM | POA: Diagnosis not present

## 2016-08-12 DIAGNOSIS — R0602 Shortness of breath: Secondary | ICD-10-CM | POA: Diagnosis not present

## 2016-08-12 LAB — MYOCARDIAL PERFUSION IMAGING
CHL CUP NUCLEAR SRS: 5
CHL CUP NUCLEAR SSS: 7
LV dias vol: 101 mL (ref 46–106)
LVSYSVOL: 43 mL
NUC STRESS TID: 1.21
Peak HR: 80 {beats}/min
Rest HR: 60 {beats}/min
SDS: 2

## 2016-08-12 MED ORDER — TECHNETIUM TC 99M TETROFOSMIN IV KIT
10.2000 | PACK | Freq: Once | INTRAVENOUS | Status: AC | PRN
  Administered 2016-08-12: 10.2 via INTRAVENOUS
  Filled 2016-08-12: qty 11

## 2016-08-12 MED ORDER — TECHNETIUM TC 99M TETROFOSMIN IV KIT
31.3000 | PACK | Freq: Once | INTRAVENOUS | Status: AC | PRN
  Administered 2016-08-12: 31.3 via INTRAVENOUS
  Filled 2016-08-12: qty 32

## 2016-08-12 MED ORDER — AMINOPHYLLINE 25 MG/ML IV SOLN
75.0000 mg | Freq: Once | INTRAVENOUS | Status: AC
  Administered 2016-08-12: 75 mg via INTRAVENOUS

## 2016-08-12 MED ORDER — REGADENOSON 0.4 MG/5ML IV SOLN
0.4000 mg | Freq: Once | INTRAVENOUS | Status: AC
  Administered 2016-08-12: 0.4 mg via INTRAVENOUS

## 2016-08-13 LAB — CBC
HEMOGLOBIN: 14.2 g/dL (ref 11.1–15.9)
Hematocrit: 41.9 % (ref 34.0–46.6)
MCH: 31.3 pg (ref 26.6–33.0)
MCHC: 33.9 g/dL (ref 31.5–35.7)
MCV: 93 fL (ref 79–97)
Platelets: 230 10*3/uL (ref 150–379)
RBC: 4.53 x10E6/uL (ref 3.77–5.28)
RDW: 14.1 % (ref 12.3–15.4)
WBC: 6.4 10*3/uL (ref 3.4–10.8)

## 2016-08-13 LAB — COMPREHENSIVE METABOLIC PANEL
ALK PHOS: 90 IU/L (ref 39–117)
ALT: 23 IU/L (ref 0–32)
AST: 26 IU/L (ref 0–40)
Albumin/Globulin Ratio: 2.1 (ref 1.2–2.2)
Albumin: 4.6 g/dL (ref 3.5–5.5)
BILIRUBIN TOTAL: 0.3 mg/dL (ref 0.0–1.2)
BUN / CREAT RATIO: 17 (ref 9–23)
BUN: 17 mg/dL (ref 6–24)
CHLORIDE: 101 mmol/L (ref 96–106)
CO2: 26 mmol/L (ref 20–29)
Calcium: 9.6 mg/dL (ref 8.7–10.2)
Creatinine, Ser: 1.02 mg/dL — ABNORMAL HIGH (ref 0.57–1.00)
GFR calc Af Amer: 71 mL/min/{1.73_m2} (ref >59)
GFR calc non Af Amer: 61 mL/min/{1.73_m2} (ref >59)
GLUCOSE: 126 mg/dL — AB (ref 65–99)
Globulin, Total: 2.2 g/dL (ref 1.5–4.5)
Potassium: 4.9 mmol/L (ref 3.5–5.2)
SODIUM: 141 mmol/L (ref 134–144)
Total Protein: 6.8 g/dL (ref 6.0–8.5)

## 2016-08-13 LAB — LIPID PANEL
CHOLESTEROL TOTAL: 250 mg/dL — AB (ref 100–199)
Chol/HDL Ratio: 4.1 ratio (ref 0.0–4.4)
HDL: 61 mg/dL (ref >39)
LDL Calculated: 162 mg/dL — ABNORMAL HIGH (ref 0–99)
Triglycerides: 134 mg/dL (ref 0–149)
VLDL CHOLESTEROL CAL: 27 mg/dL (ref 5–40)

## 2016-08-13 LAB — TSH: TSH: 0.804 u[IU]/mL (ref 0.450–4.500)

## 2016-08-16 NOTE — Telephone Encounter (Signed)
PT CALLED FOR RX REFILL ON HYDROCODONE  [phone] PLEASE CALL WHEN READY FOR PICK UP

## 2016-08-17 MED ORDER — HYDROCODONE-ACETAMINOPHEN 5-325 MG PO TABS: ORAL_TABLET | ORAL | 0 refills | Status: DC

## 2016-08-17 NOTE — Telephone Encounter (Signed)
Meds ordered this encounter  Medications  . HYDROcodone-acetaminophen (NORCO/VICODIN) 5-325 MG tablet    Sig: take 1 TABLET BY MOUTH EVERY 6 HOURS AS NEEDED for moderate pain    Dispense:  120 tablet    Refill:  0

## 2016-08-18 ENCOUNTER — Telehealth: Payer: Self-pay

## 2016-08-18 ENCOUNTER — Ambulatory Visit (HOSPITAL_COMMUNITY): Payer: Self-pay | Admitting: Psychiatry

## 2016-08-18 NOTE — Telephone Encounter (Signed)
Advised pt that rx for vicodin is ready for pickup at [address]

## 2016-08-18 NOTE — Telephone Encounter (Signed)
Pt advised that rx is ready for pickup at [facility]

## 2016-08-19 ENCOUNTER — Telehealth: Payer: Self-pay | Admitting: *Deleted

## 2016-08-19 MED ORDER — ROSUVASTATIN CALCIUM 20 MG PO TABS
20.0000 mg | ORAL_TABLET | Freq: Every day | ORAL | 3 refills | Status: DC
Start: 2016-08-19 — End: 2017-11-03

## 2016-08-19 NOTE — Telephone Encounter (Signed)
-----   Message from Troy Sine, MD sent at 08/14/2016  2:00 PM EDT ----- Labs good except lipids;  Did not tolerate lipitor in past.  Try crestor 20 mg daily.  Glucose is slightly increased.

## 2016-08-19 NOTE — Telephone Encounter (Signed)
Patient notified of lab results and recommendations. Patient voiced understanding. Rosuvastatin 20 mg sent in to [facility]

## 2016-09-02 ENCOUNTER — Telehealth: Payer: Self-pay | Admitting: Cardiovascular Disease

## 2016-09-02 NOTE — Telephone Encounter (Signed)
New message      Pt had to cancel her 09-03-16 follow up appt with Dr Claiborne Billings.  She said she received the test results and want to know if she still need to be seen?  Please call

## 2016-09-02 NOTE — Telephone Encounter (Signed)
Pt rescheduled appt with Dr. Markus Daft available was 10/17 at 3 pm.   Pt declined f/u with a PA---even with Dr. Claiborne Billings in the office.

## 2016-09-03 ENCOUNTER — Ambulatory Visit: Payer: Medicare HMO | Admitting: Cardiovascular Disease

## 2016-09-03 NOTE — Telephone Encounter (Signed)
ok 

## 2016-09-08 ENCOUNTER — Encounter (HOSPITAL_COMMUNITY): Payer: Self-pay | Admitting: Psychiatry

## 2016-09-08 ENCOUNTER — Ambulatory Visit (INDEPENDENT_AMBULATORY_CARE_PROVIDER_SITE_OTHER): Payer: Medicare HMO | Admitting: Psychiatry

## 2016-09-08 DIAGNOSIS — Z79899 Other long term (current) drug therapy: Secondary | ICD-10-CM

## 2016-09-08 DIAGNOSIS — F988 Other specified behavioral and emotional disorders with onset usually occurring in childhood and adolescence: Secondary | ICD-10-CM | POA: Diagnosis not present

## 2016-09-08 DIAGNOSIS — F3162 Bipolar disorder, current episode mixed, moderate: Secondary | ICD-10-CM | POA: Diagnosis not present

## 2016-09-08 DIAGNOSIS — F17210 Nicotine dependence, cigarettes, uncomplicated: Secondary | ICD-10-CM

## 2016-09-08 MED ORDER — VENLAFAXINE HCL ER 150 MG PO CP24: 150.0000 mg | ORAL_CAPSULE | Freq: Every day | ORAL | 0 refills | Status: DC

## 2016-09-08 MED ORDER — LAMOTRIGINE 150 MG PO TABS: 150.0000 mg | ORAL_TABLET | Freq: Two times a day (BID) | ORAL | 0 refills | Status: DC

## 2016-09-08 MED ORDER — TRAZODONE HCL 100 MG PO TABS: 200.0000 mg | ORAL_TABLET | Freq: Every day | ORAL | 0 refills | Status: DC

## 2016-09-08 NOTE — Progress Notes (Signed)
[place] MD/PA/NP OP Progress Note  09/08/2016 10:36 AM Anita Arroyo  MRN:  [id number]  Chief Complaint:  Subjective:  I am doing good on my medication.  HPI: Patient came for her follow-up appointment.  She is taking her medication and reported no side effects.  She has been busy in the summer and continues to enjoy as a Surveyor, minerals for the dogs.  Sometimes she has sleeping issues because of shoulder pain which is slowly and gradually improving.  She denies any irritability, mania, psychosis or any hallucination.  She is seeing her primary care physician at [facility] and recently had blood work.  She denies any feeling of hopelessness or worthlessness.  She denies any suicidal thoughts.  She has no rash, tremors shakes or any EPS.  Her energy level is good.  She is working at backyard and able to lost a few pounds since the last visit.  Her energy level is good.  Her appetite is okay.  Patient denies any drinking alcohol or using any illegal substances.  She had a good support from her biological and adopted mother.  Visit Diagnosis:    ICD-10-CM   1. Bipolar 1 disorder, mixed, moderate (HCC) F31.62 venlafaxine XR (EFFEXOR-XR) 150 MG 24 hr capsule    traZODone (DESYREL) 100 MG tablet    lamoTRIgine (LAMICTAL) 150 MG tablet    Past Psychiatric History: Reviewed. Patient has history of bipolar disorder. She had a history of mood swings, anger, impulsive behavior. She had tried Depakote, Prozac, Paxil, Zoloft and Topamax. She was seen at [facility] for more than 10 years but she was not happy there. Patient denies any history of physical, sexual or verbal abuse. She was also diagnosed with ADD but a stimulant cause worsening of bipolar disorder. She had tried Ritalin and Adderall in the past.  Past Medical History:  Past Medical History:  Diagnosis Date  . ADHD (attention deficit hyperactivity disorder)   . Arthritis   . Asthma   . Bipolar 1 disorder ([place])   . Bipolar 1 disorder ([place])   . Breast cancer  ([place])   . Depression   . Dysrhythmia    palpitations  . Dysrhythmia    atrial fibrillation  . Fibromyalgia   . Finger fracture 2011  . GERD (gastroesophageal reflux disease)   . Heart murmur   . History of stress test 04/28/2011   showed normal perfusion without scar or ischemia  . Hx of echocardiogram 04/28/2011   showed normal systolic function with mild diastolic dysfunction,she had trace MR but did not have frank mitral valve prolapse demonstrated. She had mild pulmonary hypertension with an estimated RV systolic pressure at 34 mm.  . Hyperlipemia   . Meniere disease   . MVP (mitral valve prolapse)   . Neuromuscular disorder ([place])    fibromyalgia    Past Surgical History:  Procedure Laterality Date  . ANKLE SURGERY     left x4  . BREAST SURGERY  2011   lumpectomy right breast   . COLONOSCOPY WITH PROPOFOL N/A 02/25/2014   Procedure: COLONOSCOPY WITH PROPOFOL;  Surgeon: Garlan Fair, MD;  Location: [facility];  Service: Endoscopy;  Laterality: N/A;  . deviated septum    . ECTOPIC PREGNANCY SURGERY    . ELBOW SURGERY     right elbow  . EYE SURGERY     cataract surgery  . fibroid     2-3 fibroid adenomas removed  . JOINT REPLACEMENT  2011   right knee   .  KNEE SURGERY     left knee  . SHOULDER ARTHROSCOPY WITH BICEPSTENOTOMY Right 09/25/2015   Procedure: SHOULDER ARTHROSCOPY WITH BICEPSTENOTOMY;  Surgeon: Ninetta Lights, MD;  Location: [place];  Service: Orthopedics;  Laterality: Right;  . SHOULDER ARTHROSCOPY WITH DISTAL CLAVICLE RESECTION Right 09/25/2015   Procedure: SHOULDER ARTHROSCOPY WITH DISTAL CLAVICLE RESECTION;  Surgeon: Ninetta Lights, MD;  Location: [place];  Service: Orthopedics;  Laterality: Right;  . SHOULDER ARTHROSCOPY WITH SUBACROMIAL DECOMPRESSION Right 09/25/2015   Procedure: RIGHT SHOULDER ARTHROSCOPY DEBRIDEMENT,ACROMIOPLASTY,DISTAL CLAVICAL EXCISION, RELEASE OF BICEPS TENDON;  Surgeon: Ninetta Lights, MD;   Location: [place];  Service: Orthopedics;  Laterality: Right;  . TMJ ARTHROPLASTY    . TONSILLECTOMY    . TOTAL SHOULDER ARTHROPLASTY Left 01/05/2016  . UTERINE FIBROID SURGERY     polups and fibroids removed   . WRIST SURGERY     left    Family Psychiatric History: Reviewed.  Family History:  Family History  Problem Relation Age of Onset  . Mitral valve prolapse Mother   . Heart Problems Mother   . Cancer Mother     Social History:  Social History   Social History  . Marital status: Single    Spouse name: N/A  . Number of children: N/A  . Years of education: N/A   Social History Main Topics  . Smoking status: Current Every Day Smoker    Packs/day: 1.50    Years: 40.00    Types: Cigarettes  . Smokeless tobacco: Never Used     Comment: Cannot afford chantix. Working on getting discount from drug compny  . Alcohol use 0.0 oz/week     Comment: occ  . Drug use: No  . Sexual activity: Not Asked   Other Topics Concern  . None   Social History Narrative  . None    Allergies:  Allergies  Allergen Reactions  . Contrast Media [Iodinated Diagnostic Agents] Shortness Of Breath  . Iodine Shortness Of Breath  . Adhesive [Tape] Other (See Comments)    Reaction:  Blisters   . Celecoxib     "I climb the walls"  . Statins     Body aches  . Ceclor [Cefaclor] Rash  . Moxifloxacin Rash  . Sulfonamide Derivatives Nausea And Vomiting    Metabolic Disorder Labs: Recent Results (from the past 2160 hour(s))  Myocardial Perfusion Imaging     Status: None   Collection Time: 08/12/16 12:13 PM  Result Value Ref Range   Rest HR 60 bpm   Rest BP 115/64 mmHg   Peak HR 80 bpm   Peak BP 120/54 mmHg   SSS 7    SRS 5    SDS 2    TID 1.21    LV sys vol 43 mL   LV dias vol 101 46 - 106 mL  CBC     Status: None   Collection Time: 08/12/16  4:59 PM  Result Value Ref Range   WBC 6.4 3.4 - 10.8 x10E3/uL   RBC 4.53 3.77 - 5.28 x10E6/uL   Hemoglobin 14.2 11.1  - 15.9 g/dL   Hematocrit 41.9 34.0 - 46.6 %   MCV 93 79 - 97 fL   MCH 31.3 26.6 - 33.0 pg   MCHC 33.9 31.5 - 35.7 g/dL   RDW 14.1 12.3 - 15.4 %   Platelets 230 150 - 379 x10E3/uL  Comprehensive metabolic panel     Status: Abnormal   Collection Time:  08/12/16  4:59 PM  Result Value Ref Range   Glucose 126 (H) 65 - 99 mg/dL   BUN 17 6 - 24 mg/dL   Creatinine, Ser 1.02 (H) 0.57 - 1.00 mg/dL   GFR calc non Af Amer 61 >59 mL/min/1.73   GFR calc Af Amer 71 >59 mL/min/1.73   BUN/Creatinine Ratio 17 9 - 23   Sodium 141 134 - 144 mmol/L   Potassium 4.9 3.5 - 5.2 mmol/L   Chloride 101 96 - 106 mmol/L   CO2 26 20 - 29 mmol/L   Calcium 9.6 8.7 - 10.2 mg/dL   Total Protein 6.8 6.0 - 8.5 g/dL   Albumin 4.6 3.5 - 5.5 g/dL   Globulin, Total 2.2 1.5 - 4.5 g/dL   Albumin/Globulin Ratio 2.1 1.2 - 2.2   Bilirubin Total 0.3 0.0 - 1.2 mg/dL   Alkaline Phosphatase 90 39 - 117 IU/L   AST 26 0 - 40 IU/L   ALT 23 0 - 32 IU/L  Lipid panel     Status: Abnormal   Collection Time: 08/12/16  4:59 PM  Result Value Ref Range   Cholesterol, Total 250 (H) 100 - 199 mg/dL   Triglycerides 134 0 - 149 mg/dL   HDL 61 >39 mg/dL   VLDL Cholesterol Cal 27 5 - 40 mg/dL   LDL Calculated 162 (H) 0 - 99 mg/dL   Chol/HDL Ratio 4.1 0.0 - 4.4 ratio    Comment:                                   T. Chol/HDL Ratio                                             Men  Women                               1/2 Avg.Risk  3.4    3.3                                   Avg.Risk  5.0    4.4                                2X Avg.Risk  9.6    7.1                                3X Avg.Risk 23.4   11.0   TSH     Status: None   Collection Time: 08/12/16  4:59 PM  Result Value Ref Range   TSH 0.804 0.450 - 4.500 uIU/mL    No results found for: HGBA1C, MPG No results found for: PROLACTIN Lab Results  Component Value Date   CHOL 250 (H) 08/12/2016   TRIG 134 08/12/2016   HDL 61 08/12/2016   CHOLHDL 4.1 08/12/2016   VLDL 16  11/22/2013   LDLCALC 162 (H) 08/12/2016   LDLCALC 79 11/22/2013     Current Medications: Current Outpatient Prescriptions  Medication Sig Dispense Refill  . albuterol (PROVENTIL HFA;VENTOLIN HFA) 108 (90 Base)  MCG/ACT inhaler Inhale 2 puffs into the lungs every 4 (four) hours as needed for wheezing or shortness of breath (cough, shortness of breath or wheezing.). 1 Inhaler 1  . atenolol (TENORMIN) 50 MG tablet Take 1 tablet (50 mg total) by mouth 2 (two) times daily. 160 tablet 3  . Biotin (BIOTIN 5000) 5 MG CAPS Take 5 mg by mouth daily.    . cetirizine (ZYRTEC) 10 MG tablet Take 10 mg by mouth daily.     . fluticasone (FLONASE) 50 MCG/ACT nasal spray USE 1 SPRAY IN EACH NOSTRIL TWICE DAILY 32 g 1  . Fluticasone-Salmeterol (ADVAIR) 250-50 MCG/DOSE AEPB Inhale 1 puff into the lungs 2 (two) times daily.    Marland Kitchen HYDROcodone-acetaminophen (NORCO/VICODIN) 5-325 MG tablet take 1 TABLET BY MOUTH EVERY 6 HOURS AS NEEDED for moderate pain 120 tablet 0  . lamoTRIgine (LAMICTAL) 150 MG tablet Take 1 tablet (150 mg total) by mouth 2 (two) times daily. 180 tablet 0  . Magnesium 250 MG TABS Take 250 mg by mouth daily.    . montelukast (SINGULAIR) 10 MG tablet Take 1 tablet (10 mg total) by mouth at bedtime. 30 tablet 3  . pantoprazole (PROTONIX) 40 MG tablet Take 1 tablet (40 mg total) by mouth daily. 90 tablet 1  . rosuvastatin (CRESTOR) 20 MG tablet Take 1 tablet (20 mg total) by mouth daily. 30 tablet 3  . traZODone (DESYREL) 100 MG tablet Take 2 tablets (200 mg total) by mouth at bedtime. 180 tablet 0  . triamterene-hydrochlorothiazide (MAXZIDE-25) 37.5-25 MG tablet Take 1 tablet by mouth daily.    Marland Kitchen venlafaxine XR (EFFEXOR-XR) 150 MG 24 hr capsule Take 1 capsule (150 mg total) by mouth at bedtime. 90 capsule 0   No current facility-administered medications for this visit.     Neurologic: Headache: No Seizure: No Paresthesias: No  Musculoskeletal: Strength & Muscle Tone: within normal  limits Gait & Station: normal Patient leans: N/A  Psychiatric Specialty Exam: Review of Systems  Constitutional: Negative.   Musculoskeletal:       Shoulder pain  Skin: Negative for itching and rash.    Blood pressure 102/66, pulse 68, height 5' 2.5" (1.588 m), weight 165 lb 9.6 oz (75.1 kg), last menstrual period 06/25/2011.Body mass index is 29.81 kg/m.  General Appearance: Casual  Eye Contact:  Good  Speech:  Clear and Coherent  Volume:  Normal  Mood:  Euthymic  Affect:  Congruent  Thought Process:  Goal Directed  Orientation:  Full (Time, Place, and Person)  Thought Content: Logical   Suicidal Thoughts:  No  Homicidal Thoughts:  No  Memory:  Immediate;   Good Recent;   Good Remote;   Good  Judgement:  Good  Insight:  Good  Psychomotor Activity:  Normal  Concentration:  Concentration: Good and Attention Span: Good  Recall:  Good  Fund of Knowledge: Good  Language: Good  Akathisia:  No  Handed:  Right  AIMS (if indicated):  0  Assets:  Communication Skills Desire for Improvement Housing Resilience Social Support  ADL's:  Intact  Cognition: WNL  Sleep:  ok    Assessment: Bipolar Disorder type 1   Plan: Patient is stable on her current psychiatric medication.  I review her blood work results.  I will continue Lamictal 200 mg daily, Effexor XR 150 mg daily and trazodone 300 mg at bedtime.  Discussed vacation side effects and benefits.  Recommended to call us back if she has any question or any concern.  Follow-up  in 3 months.   Sayvon Arterberry T., MD 09/08/2016, 10:36 AM

## 2016-09-09 ENCOUNTER — Other Ambulatory Visit: Payer: Self-pay

## 2016-09-09 DIAGNOSIS — G894 Chronic pain syndrome: Secondary | ICD-10-CM

## 2016-09-09 MED ORDER — MONTELUKAST SODIUM 10 MG PO TABS: 10.0000 mg | ORAL_TABLET | Freq: Every day | ORAL | 1 refills | Status: DC

## 2016-09-15 ENCOUNTER — Telehealth: Payer: Self-pay | Admitting: Physician Assistant

## 2016-09-15 NOTE — Telephone Encounter (Signed)
Pt is needing a refill on hydrocodone  Best number [phone]

## 2016-09-17 NOTE — Telephone Encounter (Signed)
Adv pt med refill request time frame is between 48-72 business hours.

## 2016-09-20 MED ORDER — HYDROCODONE-ACETAMINOPHEN 5-325 MG PO TABS: ORAL_TABLET | ORAL | 0 refills | Status: DC

## 2016-09-20 NOTE — Telephone Encounter (Signed)
Meds ordered this encounter  . HYDROcodone-acetaminophen (NORCO/VICODIN) 5-325 MG tablet    Sig: take 1 TABLET BY MOUTH EVERY 6 HOURS AS NEEDED for moderate pain    Dispense:  120 tablet    Refill:  0    Order Specific Question:   Supervising Provider    Answer:   Brigitte Pulse, EVA N [[id number]]

## 2016-09-20 NOTE — Telephone Encounter (Signed)
The patient called back and spoke with Ivar Drape, PA-C, who spoke with me. I printed and signed the prescription. Please see the patient's other message.

## 2016-09-20 NOTE — Telephone Encounter (Signed)
Patient request to pick up tomorrow.  Advised to pick up at 102.  Placed in files for pick up.

## 2016-10-05 ENCOUNTER — Ambulatory Visit: Payer: Medicare HMO | Admitting: Physician Assistant

## 2016-10-12 ENCOUNTER — Ambulatory Visit (INDEPENDENT_AMBULATORY_CARE_PROVIDER_SITE_OTHER): Payer: Medicare HMO | Admitting: Physician Assistant

## 2016-10-12 ENCOUNTER — Telehealth: Payer: Self-pay | Admitting: Physician Assistant

## 2016-10-12 ENCOUNTER — Encounter: Payer: Self-pay | Admitting: Physician Assistant

## 2016-10-12 VITALS — BP 106/67 | HR 67 | Temp 98.3°F | Resp 18 | Ht 62.5 in | Wt 163.0 lb

## 2016-10-12 DIAGNOSIS — G894 Chronic pain syndrome: Secondary | ICD-10-CM

## 2016-10-12 DIAGNOSIS — Z1159 Encounter for screening for other viral diseases: Secondary | ICD-10-CM

## 2016-10-12 DIAGNOSIS — M797 Fibromyalgia: Secondary | ICD-10-CM

## 2016-10-12 DIAGNOSIS — E785 Hyperlipidemia, unspecified: Secondary | ICD-10-CM | POA: Diagnosis not present

## 2016-10-12 DIAGNOSIS — R739 Hyperglycemia, unspecified: Secondary | ICD-10-CM | POA: Diagnosis not present

## 2016-10-12 DIAGNOSIS — R51 Headache: Secondary | ICD-10-CM

## 2016-10-12 DIAGNOSIS — I10 Essential (primary) hypertension: Secondary | ICD-10-CM | POA: Diagnosis not present

## 2016-10-12 DIAGNOSIS — F17200 Nicotine dependence, unspecified, uncomplicated: Secondary | ICD-10-CM

## 2016-10-12 DIAGNOSIS — Z114 Encounter for screening for human immunodeficiency virus [HIV]: Secondary | ICD-10-CM | POA: Diagnosis not present

## 2016-10-12 DIAGNOSIS — J449 Chronic obstructive pulmonary disease, unspecified: Secondary | ICD-10-CM | POA: Diagnosis not present

## 2016-10-12 DIAGNOSIS — R519 Headache, unspecified: Secondary | ICD-10-CM | POA: Insufficient documentation

## 2016-10-12 DIAGNOSIS — G8929 Other chronic pain: Secondary | ICD-10-CM

## 2016-10-12 DIAGNOSIS — Z23 Encounter for immunization: Secondary | ICD-10-CM

## 2016-10-12 DIAGNOSIS — M1990 Unspecified osteoarthritis, unspecified site: Secondary | ICD-10-CM

## 2016-10-12 MED ORDER — ALBUTEROL SULFATE HFA 108 (90 BASE) MCG/ACT IN AERS: 2.0000 | INHALATION_SPRAY | RESPIRATORY_TRACT | 1 refills | Status: DC | PRN

## 2016-10-12 MED ORDER — HYDROCODONE-ACETAMINOPHEN 5-325 MG PO TABS: ORAL_TABLET | ORAL | 0 refills | Status: DC

## 2016-10-12 NOTE — Progress Notes (Signed)
Patient ID: Anita Arroyo, female    DOB: Sep 30, 1960, 56 y.o.   MRN: [id number]  PCP: Porfirio Oar, PA-C  Chief Complaint  Patient presents with  . Pain    pt states her pain as been about the same since her last visit but she has had an increase in headaches.  . Follow-up  . Fall    pt states she fell about a week ago because of the rain and landed on her butt. Pt states she feels like when she sits down it feels like she is sitting on a rock. Pt reports bruising.  . Medication Refill    Albuterol Inhaler    Subjective:   Presents for evaluation of chronic pain (OA of multiple sites), evaluation of LEFT buttock pain following a fall, and medication refill.  She has stopped rosuvastatin due to muscle pain, which has resolved. She has a cardiology follow-up 10/17.  I have still not received records from her previous PCP, Dr. Abigail Miyamoto. He has changed office locations and I suspect that may have delayed our receipt of the requested records..  Sees orthopedics on Friday of this week.  1 week ago, she slipped on her deck (it was wet), and fell down the 2 steps, landing on her bottom. She had pain in the LEFT buttock with walking for several days, continues to have pain with sitting. Describes it as a "dull throb," 1-4/10, and "Like I'm sitting on a huge rock." Hydrocodone helps reduce the pain. There is no pain radiating into the hip, groin or leg.   She relates headaches for the past 3-4 months. The pain is constant, and occurs in the frontal and parietal regions of the head. Rates it 6/10, sometimes throbbing, associated with photosensitivity but not phonosensitivity. She thinks it might be related to increased sinus congestion, as she's had some improvement in the past week with the use of a nasal spray. No other identified triggers. Hydrocodone does not improve the headache.  Often skipping meals. Tries to hydrate well. In addition to water, she drinks 2 cups of coffee and an  energy drink daily.  Poor sleep, in part due to getting up repeatedly to urinate.  Smoking 2 ppd (82 pack year history). Previously tried Chantix with success, but started smoking again. When she tried it again, it "didn't agree" with her.  She asks when her last colonoscopy was, wanting to be sure that she gets scheduled for repeat in 5 years. When informed that she reported her last colonoscopy to [place] as being 02/2014, she states that she thought it was longer ago.  Review of Systems  Constitutional: Negative.   HENT: Positive for congestion. Negative for sore throat.   Eyes: Negative for photophobia and visual disturbance.  Respiratory: Negative for cough, chest tightness, shortness of breath and wheezing.   Cardiovascular: Negative for chest pain and palpitations.  Gastrointestinal: Positive for constipation. Negative for abdominal pain, diarrhea, nausea and vomiting.  Genitourinary: Negative for dysuria, frequency, hematuria and urgency.  Musculoskeletal: Positive for arthralgias and myalgias.  Skin: Negative for rash.  Neurological: Positive for headaches. Negative for dizziness and weakness.  Psychiatric/Behavioral: Positive for sleep disturbance. Negative for decreased concentration. The patient is not nervous/anxious.        Patient Active Problem List   Diagnosis Date Noted  . 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 Chronic pain syndrome 05/27/2016  . Osteoarthritis 05/16/2016  . Seasonal allergies 05/03/2016  . History of palpitations 09/16/2015  .  Bipolar 1 disorder (HCC) 09/16/2015  . History of breast cancer 11/16/2012  . Fibromyalgia 11/16/2012  . Smoker 11/16/2012  . INTERTRIGO 04/13/2010  . Hyperlipidemia 09/02/2006  . DEPRESSION 09/02/2006  . Essential hypertension 09/02/2006  . COPD (chronic obstructive pulmonary disease) (HCC) 09/02/2006  . GERD 09/02/2006     Prior to Admission medications   Medication Sig Start Date End Date Taking? Authorizing  Provider  albuterol (PROVENTIL HFA;VENTOLIN HFA) 108 (90 Base) MCG/ACT inhaler Inhale 2 puffs into the lungs every 4 (four) hours as needed for wheezing or shortness of breath (cough, shortness of breath or wheezing.). 06/08/16  Yes Jawanza Zambito, PA-C  atenolol (TENORMIN) 50 MG tablet Take 1 tablet (50 mg total) by mouth 2 (two) times daily. 07/02/16  Yes Lennette Bihari, MD  Biotin (BIOTIN 5000) 5 MG CAPS Take 5 mg by mouth daily.   Yes [provider]  cetirizine (ZYRTEC) 10 MG tablet Take 10 mg by mouth daily.    Yes [provider]  fluticasone (FLONASE) 50 MCG/ACT nasal spray USE 1 SPRAY IN EACH NOSTRIL TWICE DAILY 07/01/16  Yes Devaney Segers, PA-C  Fluticasone-Salmeterol (ADVAIR) 250-50 MCG/DOSE AEPB Inhale 1 puff into the lungs 2 (two) times daily.   Yes [provider]  HYDROcodone-acetaminophen (NORCO/VICODIN) 5-325 MG tablet take 1 TABLET BY MOUTH EVERY 6 HOURS AS NEEDED for moderate pain 09/20/16  Yes Krystian Ferrentino, PA-C  lamoTRIgine (LAMICTAL) 150 MG tablet Take 1 tablet (150 mg total) by mouth 2 (two) times daily. 09/08/16  Yes Arfeen, Phillips Grout, MD  Magnesium 250 MG TABS Take 250 mg by mouth daily.   Yes [provider]  montelukast (SINGULAIR) 10 MG tablet Take 1 tablet (10 mg total) by mouth at bedtime. 09/09/16  Yes Zakarie Sturdivant, PA-C  pantoprazole (PROTONIX) 40 MG tablet Take 1 tablet (40 mg total) by mouth daily. 07/27/16  Yes Amantha Sklar, PA-C  traZODone (DESYREL) 100 MG tablet Take 2 tablets (200 mg total) by mouth at bedtime. 09/08/16  Yes Arfeen, Phillips Grout, MD  triamterene-hydrochlorothiazide (MAXZIDE-25) 37.5-25 MG tablet Take 1 tablet by mouth daily.   Yes [provider]  venlafaxine XR (EFFEXOR-XR) 150 MG 24 hr capsule Take 1 capsule (150 mg total) by mouth at bedtime. 09/08/16  Yes Arfeen, Phillips Grout, MD  rosuvastatin (CRESTOR) 20 MG tablet Take 1 tablet (20 mg total) by mouth daily. Patient not taking: Reported on 10/12/2016 08/19/16  11/17/16  Lennette Bihari, MD     Allergies  Allergen Reactions  . Contrast Media [Iodinated Diagnostic Agents] Shortness Of Breath  . Iodine Shortness Of Breath  . Adhesive [Tape] Other (See Comments)    Reaction:  Blisters   . Celecoxib     "I climb the walls"  . Statins     Body aches  . Ceclor [Cefaclor] Rash  . Moxifloxacin Rash  . Sulfonamide Derivatives Nausea And Vomiting       Objective:  Physical Exam  Constitutional: She is oriented to person, place, and time. She appears well-developed and well-nourished. She is active and cooperative. No distress.  BP 106/67 (BP Location: Right Arm, Patient Position: Sitting, Cuff Size: Normal)   Pulse 67   Temp 98.3 F (36.8 C) (Oral)   Resp 18   Ht 5' 2.5" (1.588 m)   Wt 163 lb (73.9 kg)   LMP 06/25/2011   SpO2 96%   BMI 29.34 kg/m   HENT:  Head: Normocephalic and atraumatic.  Right Ear: Hearing normal.  Left Ear:  Hearing normal.  Eyes: Conjunctivae are normal. No scleral icterus.  Neck: Normal range of motion. Neck supple. No thyromegaly present.  Cardiovascular: Normal rate, regular rhythm and normal heart sounds.   Pulses:      Radial pulses are 2+ on the right side, and 2+ on the left side.  Pulmonary/Chest: Effort normal and breath sounds normal.  Lymphadenopathy:       Head (right side): No tonsillar, no preauricular, no posterior auricular and no occipital adenopathy present.       Head (left side): No tonsillar, no preauricular, no posterior auricular and no occipital adenopathy present.    She has no cervical adenopathy.       Right: No supraclavicular adenopathy present.       Left: No supraclavicular adenopathy present.  Neurological: She is alert and oriented to person, place, and time. No sensory deficit.  Skin: Skin is warm, dry and intact. No rash noted. No cyanosis or erythema. Nails show no clubbing.  Psychiatric: She has a normal mood and affect. Her speech is normal and behavior is normal.            Assessment & Plan:   Problem List Items Addressed This Visit    Hyperlipidemia    Stopped rosuvastatin due to muscle pain. Plans to discuss alternatives with cardiology next month.      Essential hypertension    Well controlled. No changes today.      COPD (chronic obstructive pulmonary disease) (HCC) - Primary    Encouraged smoking cessation. Refilled albuterol.      Relevant Medications   albuterol (PROVENTIL HFA;VENTOLIN HFA) 108 (90 Base) MCG/ACT inhaler   Fibromyalgia    Not on NSAIDS. Possibly due to GERD. Called Dr. Recardo Evangelist office to request records. Continue venlafaxine and trazodone. Consider addition of muscle relaxer.      Smoker    Encouraged smoking cessation.      Osteoarthritis    Proceed with orthopedics follow-up 9/21 as planned.      Relevant Medications   HYDROcodone-acetaminophen (NORCO/VICODIN) 5-325 MG tablet   Chronic pain syndrome    Would likely benefit from gabapentin, still waiting to review previous PCP records. Depending on plan with orthopedics, may proceed with gabapentin without reviewing notes. Due for UDS (due to moderate opioid risk) 11/2016.      Relevant Medications   HYDROcodone-acetaminophen (NORCO/VICODIN) 5-325 MG tablet   Headache    Avoid skipping meals, hydrate during the day and reduce liquids in the evenings to reduce nocturia.       Relevant Medications   HYDROcodone-acetaminophen (NORCO/VICODIN) 5-325 MG tablet    Other Visit Diagnoses    Flu vaccine need       Relevant Orders   Flu Vaccine QUAD 36+ mos IM (Completed)   Hyperglycemia       avoid skipping meals. Hydrate. Aquatic exercise.   Relevant Orders   Hemoglobin A1c (Completed)   Screening for HIV (human immunodeficiency virus)       Relevant Orders   HIV antibody (Completed)   Need for hepatitis C screening test       Relevant Orders   Hepatitis C antibody (Completed)       Return in about 3 months (around 01/11/2017). Will need  UDS at that time.  Fernande Bras, PA-C [facility]

## 2016-10-12 NOTE — Patient Instructions (Addendum)
1. To help reduce constipation and promote bowel health, 1. Drink at least 64 ounces of water each day; 2. Eat plenty of fiber (fruits, vegetables, whole grains, legumes) 3. Get plenty of physical activity  If needed, use a stool softener (docusate) or an osmotic laxative (like Miralax) each day, or as needed.  2. Drink at least 64 ounces of water every day, but try to get most of it in several hours before bedtime, to reduce getting up during the night to urinate.  3. Avoid skipping meals, as hunger can cause headaches.  4. Try to minimize caffeine, as it can promote headaches, as can withdrawal from caffeine.  5. Continue the nasal spray and cetirizine.   6. The hydrocodone may contribute to both the constipation AND the headache.   IF you received an x-ray today, you will receive an invoice from [facility]. Please contact [facility] at [phone] with questions or concerns regarding your invoice.   IF you received labwork today, you will receive an invoice from [facility]. Please contact [facility] at [phone] with questions or concerns regarding your invoice.   Our billing staff will not be able to assist you with questions regarding bills from these companies.  You will be contacted with the lab results as soon as they are available. The fastest way to get your results is to activate your My Chart account. Instructions are located on the last page of this paperwork. If you have not heard from [place] regarding the results in 2 weeks, please contact this office.    Did you know that you begin to benefit from quitting smoking within the first twenty minutes? It's TRUE.  At 20 minutes: -blood pressure decreases -pulse rate drops -body temperature of hands and feet increases  At 8 hours: -carbon monoxide level in blood drops to normal -oxygen level in blood increases to normal  At 24 hours: -the chance of heart attack decreases  At 48 hours: -nerve endings  start regrowing -ability to smell and taste is enhanced  2 weeks-3 months: -circulation improves -walking becomes easier -lung function improves  1-9 months: -coughing, sinus congestion, fatigue and shortness of breath decreases  1 year: -excess risk of heart disease is decreased to HALF that of a smoker  5 years: Stroke risk is reduced to that of people who have never smoked  10 years: -risk of lung cancer drops to as little as half that of continuing smokers -risk of cancer of the mouth, throat, esophagus, bladder, kidney and pancreas decreases -risk of ulcer decreases  15 years -risk of heart disease is now similar to that of people who have never smoked -risk of death returns to nearly the level of people who have never smoked

## 2016-10-12 NOTE — Telephone Encounter (Signed)
Requested most recent office visits. Spoke with Ada in Medical Records.

## 2016-10-13 ENCOUNTER — Telehealth: Payer: Self-pay | Admitting: Physician Assistant

## 2016-10-13 LAB — HEMOGLOBIN A1C
Est. average glucose Bld gHb Est-mCnc: 117 mg/dL
Hgb A1c MFr Bld: 5.7 % — ABNORMAL HIGH (ref 4.8–5.6)

## 2016-10-13 LAB — HIV ANTIBODY (ROUTINE TESTING W REFLEX): HIV SCREEN 4TH GENERATION: NONREACTIVE

## 2016-10-13 LAB — HEPATITIS C ANTIBODY: Hep C Virus Ab: <0.1 s/co ratio (ref 0.0–0.9)

## 2016-10-13 NOTE — Telephone Encounter (Signed)
Called to clarify date of last colonoscopy: 02/2014

## 2016-10-14 NOTE — Assessment & Plan Note (Signed)
Stopped rosuvastatin due to muscle pain. Plans to discuss alternatives with cardiology next month.

## 2016-10-14 NOTE — Assessment & Plan Note (Signed)
Encouraged smoking cessation. Refilled albuterol.

## 2016-10-14 NOTE — Assessment & Plan Note (Signed)
Would likely benefit from gabapentin, still waiting to review previous PCP records. Depending on plan with orthopedics, may proceed with gabapentin without reviewing notes. Due for UDS (due to moderate opioid risk) 11/2016.

## 2016-10-14 NOTE — Assessment & Plan Note (Signed)
Not on NSAIDS. Possibly due to GERD. Called Dr. March Rummage office to request records. Continue venlafaxine and trazodone. Consider addition of muscle relaxer.

## 2016-10-14 NOTE — Assessment & Plan Note (Signed)
Proceed with orthopedics follow-up 9/21 as planned.

## 2016-10-14 NOTE — Assessment & Plan Note (Signed)
Encouraged smoking cessation 

## 2016-10-14 NOTE — Assessment & Plan Note (Signed)
Avoid skipping meals, hydrate during the day and reduce liquids in the evenings to reduce nocturia.

## 2016-10-14 NOTE — Assessment & Plan Note (Signed)
Well controlled. No changes today. 

## 2016-10-15 ENCOUNTER — Telehealth: Payer: Self-pay

## 2016-10-15 DIAGNOSIS — J449 Chronic obstructive pulmonary disease, unspecified: Secondary | ICD-10-CM

## 2016-10-15 NOTE — Telephone Encounter (Signed)
Pro Air was rejected because it is a nonformulary drug.  I tried to do a PA on cover my meds but it kept rejecting PA.  I talked to [facility] and she told me that Proventil is fine, so I continued the PA with proventil. Should know outcome 24-48 hrs.

## 2016-10-18 MED ORDER — ALBUTEROL SULFATE HFA 108 (90 BASE) MCG/ACT IN AERS: 2.0000 | INHALATION_SPRAY | RESPIRATORY_TRACT | 1 refills | Status: DC | PRN

## 2016-10-18 NOTE — Telephone Encounter (Signed)
Proventil was rejected as well.  [employer] sent a denial of RX coverage today and requested Ventolin HFA 90 mcg inhaler. I am forwarding to [facility] to get patient a ventolin inhaler.

## 2016-10-18 NOTE — Telephone Encounter (Signed)
Any ALBUTEROL product is fine. I have resent the order with note to the pharmacist to dispense the plan's preferred product: Ventolin HFA 90 mcg.

## 2016-10-19 DIAGNOSIS — M25572 Pain in left ankle and joints of left foot: Secondary | ICD-10-CM | POA: Diagnosis not present

## 2016-10-19 DIAGNOSIS — M19012 Primary osteoarthritis, left shoulder: Secondary | ICD-10-CM | POA: Diagnosis not present

## 2016-10-19 DIAGNOSIS — M542 Cervicalgia: Secondary | ICD-10-CM | POA: Diagnosis not present

## 2016-10-19 NOTE — Telephone Encounter (Signed)
Talked to patient and advised her that albuterol inhaler should be ready for pickup. She was very happy about this.

## 2016-10-21 ENCOUNTER — Encounter: Payer: Self-pay | Admitting: Physician Assistant

## 2016-10-21 DIAGNOSIS — R7303 Prediabetes: Secondary | ICD-10-CM | POA: Insufficient documentation

## 2016-11-10 ENCOUNTER — Ambulatory Visit (INDEPENDENT_AMBULATORY_CARE_PROVIDER_SITE_OTHER): Payer: Medicare HMO | Admitting: Cardiovascular Disease

## 2016-11-10 ENCOUNTER — Encounter: Payer: Self-pay | Admitting: Cardiovascular Disease

## 2016-11-10 VITALS — BP 92/54 | HR 67 | Ht 62.5 in | Wt 162.2 lb

## 2016-11-10 DIAGNOSIS — E785 Hyperlipidemia, unspecified: Secondary | ICD-10-CM

## 2016-11-10 DIAGNOSIS — M797 Fibromyalgia: Secondary | ICD-10-CM | POA: Diagnosis not present

## 2016-11-10 DIAGNOSIS — Z79899 Other long term (current) drug therapy: Secondary | ICD-10-CM

## 2016-11-10 DIAGNOSIS — I5189 Other ill-defined heart diseases: Secondary | ICD-10-CM

## 2016-11-10 DIAGNOSIS — R002 Palpitations: Secondary | ICD-10-CM

## 2016-11-10 DIAGNOSIS — I519 Heart disease, unspecified: Secondary | ICD-10-CM | POA: Diagnosis not present

## 2016-11-10 DIAGNOSIS — I700 Atherosclerosis of aorta: Secondary | ICD-10-CM

## 2016-11-10 DIAGNOSIS — Z716 Tobacco abuse counseling: Secondary | ICD-10-CM

## 2016-11-10 DIAGNOSIS — R5383 Other fatigue: Secondary | ICD-10-CM | POA: Diagnosis not present

## 2016-11-10 MED ORDER — ATENOLOL 50 MG PO TABS: ORAL_TABLET | ORAL | 3 refills | Status: DC

## 2016-11-10 MED ORDER — MECLIZINE HCL 25 MG PO TABS: 25.0000 mg | ORAL_TABLET | ORAL | 1 refills | Status: AC | PRN

## 2016-11-10 NOTE — Progress Notes (Signed)
Patient ID: Anita Arroyo, female   DOB: [birth year], 56 y.o.   MRN: [id number]     HPI: Anita Arroyo is a 56 y.o. female who presents for a 5 month cardiology evaluation.  I initially saw Anita Arroyo in March 2013 for evaluation of episodes of chest tightness with walking. She has a long-standing history of ongoing tobacco use, and reportedly it had a history of possible mitral valve prolapse. An echo Doppler study in April 2013 showed normal systolic function with grade 1 diastolic dysfunction. She had trace mitral regurgitation and frank mitral valve prolapse was not demonstrated. She did have mild pulmonary hypertension with estimated systolic pressure 34 mm. A nuclear perfusion study revealed normal perfusion.  She as ahistory of hyperlipidemia which was treated with Zetia. She has undergone left shoulder surgery by Dr. Percell Miller and had a ruptured by sepsis tendon. 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 Percell Miller, and history of bipolar polar disorder followed by Dr. Lovena Le as well as fibromyalgia.  Last year she lost approximately 21 pounds.  She has seen a nutritionist.  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  Unfortunately, she continues to smoke approximately 1-1/2ppd. She never had the prescription for Chantix filled.  She does have Mnire's disease for which she takes Dyazide.  She is on trazodone 200 mg at bedtime and also takes Lamictal 150 mg twice a day in addition to Effexor XR for her bipolar history.  She continues to take tamoxifen with her history of breast CA.  She has a history of palpitations and had been taking atenolol 50 mg in the morning and 25 mg at night. Due to increasing palpitations atenolol was increased to  50 mg twice a day with improvement.  He denies  any presyncope or syncope.    In 2016 a comprehensive metabolic panel, CBC, thyroid function studies were all entirely normal.  Lipid studies were excellent with a total cholesterol 144, triglycerides 80, HDL 49, LDL 79.  She will be establishing with a new primary care physician.  She was seen in August 2017 for preoperative clearance prior to undergoing shoulder surgery which was done by Dr. Percell Miller.  She tolerated her surgery well without cardiovascular compromise.  In February 2018, she was bitten by a cat in her leg and developed infection and cellulitis requiring antibiotic therapy.  In March 2018, she underwent a screening chest CT in light of her long-standing tobacco history.  This suggested atherosclerosis involving her left circumflex coronary artery and atherosclerosis in a nonaneurysmal thoracic aorta.  She continues to smoke cigarettes and has been smoking 1-1-1/2 packs per day, having started at age 41.  When I last saw her in May 2018, she had stopped taking her Lipitor and previously also been on Zetia, which she stopped secondary to cost.  With her atherosclerosis noted on CT imaging.  I recommended aggressive lipid management with target LDL less than 70.  She had not had recent lab work and as result, this was scheduled.  Her subsequent lipid panel revealed significant increasing cholesterol at 250, triglycerides 134, HDL 61, LDL 162.  This.  We called in a prescription for Crestor 20 mg daily.  Her blood sugar was also mildly increased.  She apparently only took the Crestor for  short duration and then stopped taking this since she had felt weak and was concerned about possible myalgias.  She underwent an echo Doppler study which showed normal systolic function and grade 1 diastolic dysfunction.  An exercise nuclear study was performed in July 2018 which showed normal perfusion and function.  She denies recent chest pain.  She admits to being tired.  Her mother recently had abdominal hernia  surgery.  Anita Arroyo denies any chest pain or palpitations.  She presents for reevaluation.  Past Medical History:  Diagnosis Date  . Arthritis   . Asthma   . Bipolar 1 disorder ([place])   . Bipolar 1 disorder ([place])   . Breast cancer ([place])   . Depression   . Dysrhythmia    palpitations  . Dysrhythmia    atrial fibrillation  . Fibromyalgia   . Finger fracture 2011  . GERD (gastroesophageal reflux disease)   . Heart murmur   . History of stress test 04/28/2011   showed normal perfusion without scar or ischemia  . Hx of echocardiogram 04/28/2011   showed normal systolic function with mild diastolic dysfunction,she had trace MR but did not have frank mitral valve prolapse demonstrated. She had mild pulmonary hypertension with an estimated RV systolic pressure at 34 mm.  . Hyperlipemia   . Meniere disease   . MVP (mitral valve prolapse)   . Neuromuscular disorder ([place])    fibromyalgia    Past Surgical History:  Procedure Laterality Date  . ANKLE SURGERY     left x4  . BREAST SURGERY  2011   lumpectomy right breast   . COLONOSCOPY WITH PROPOFOL N/A 02/25/2014   Procedure: COLONOSCOPY WITH PROPOFOL;  Surgeon: Garlan Fair, MD;  Location: [facility];  Service: Endoscopy;  Laterality: N/A;  . deviated septum    . ECTOPIC PREGNANCY SURGERY    . ELBOW SURGERY     right elbow  . EYE SURGERY     cataract surgery  . fibroid     2-3 fibroid adenomas removed  . JOINT REPLACEMENT  2011   right knee   . KNEE SURGERY     left knee  . SHOULDER ARTHROSCOPY WITH BICEPSTENOTOMY Right 09/25/2015   Procedure: SHOULDER ARTHROSCOPY WITH BICEPSTENOTOMY;  Surgeon: Ninetta Lights, MD;  Location: [place];  Service: Orthopedics;  Laterality: Right;  . SHOULDER ARTHROSCOPY WITH DISTAL CLAVICLE RESECTION Right 09/25/2015   Procedure: SHOULDER ARTHROSCOPY WITH DISTAL CLAVICLE RESECTION;  Surgeon: Ninetta Lights, MD;  Location: [place];  Service: Orthopedics;   Laterality: Right;  . SHOULDER ARTHROSCOPY WITH SUBACROMIAL DECOMPRESSION Right 09/25/2015   Procedure: RIGHT SHOULDER ARTHROSCOPY DEBRIDEMENT,ACROMIOPLASTY,DISTAL CLAVICAL EXCISION, RELEASE OF BICEPS TENDON;  Surgeon: Ninetta Lights, MD;  Location: [place];  Service: Orthopedics;  Laterality: Right;  . TMJ ARTHROPLASTY    . TONSILLECTOMY    . TOTAL SHOULDER ARTHROPLASTY Left 01/05/2016  . UTERINE FIBROID SURGERY     polups and fibroids removed   . WRIST SURGERY     left    Allergies  Allergen Reactions  . Contrast Media [Iodinated Diagnostic Agents] Shortness Of Breath  . Iodine Shortness Of Breath  . Adhesive [Tape] Other (See Comments)    Reaction:  Blisters   . Celecoxib     "I climb the walls"  . Statins     Body aches, constipation  . Ceclor [Cefaclor] Rash  . Moxifloxacin Rash  . Sulfonamide Derivatives Nausea And Vomiting    Current  Outpatient Prescriptions  Medication Sig Dispense Refill  . albuterol (PROVENTIL HFA;VENTOLIN HFA) 108 (90 Base) MCG/ACT inhaler Inhale 2 puffs into the lungs every 4 (four) hours as needed for wheezing or shortness of breath (cough, shortness of breath or wheezing.). 1 Inhaler 1  . atenolol (TENORMIN) 50 MG tablet Take 50 mg in the morning and 25 mg in the evening 160 tablet 3  . Biotin (BIOTIN 5000) 5 MG CAPS Take 5 mg by mouth daily.    . cetirizine (ZYRTEC) 10 MG tablet Take 10 mg by mouth daily.     . fluticasone (FLONASE) 50 MCG/ACT nasal spray USE 1 SPRAY IN EACH NOSTRIL TWICE DAILY 32 g 1  . Fluticasone-Salmeterol (ADVAIR) 250-50 MCG/DOSE AEPB Inhale 1 puff into the lungs 2 (two) times daily.    Marland Kitchen HYDROcodone-acetaminophen (NORCO/VICODIN) 5-325 MG tablet take 1 TABLET BY MOUTH EVERY 6 HOURS AS NEEDED for moderate pain 120 tablet 0  . lamoTRIgine (LAMICTAL) 150 MG tablet Take 1 tablet (150 mg total) by mouth 2 (two) times daily. 180 tablet 0  . Magnesium 250 MG TABS Take 250 mg by mouth daily.    . montelukast  (SINGULAIR) 10 MG tablet Take 1 tablet (10 mg total) by mouth at bedtime. 30 tablet 1  . pantoprazole (PROTONIX) 40 MG tablet Take 1 tablet (40 mg total) by mouth daily. 90 tablet 1  . rosuvastatin (CRESTOR) 20 MG tablet Take 1 tablet (20 mg total) by mouth daily. 30 tablet 3  . traZODone (DESYREL) 100 MG tablet Take 2 tablets (200 mg total) by mouth at bedtime. 180 tablet 0  . triamterene-hydrochlorothiazide (MAXZIDE-25) 37.5-25 MG tablet Take 0.5 tablets by mouth daily.    Marland Kitchen venlafaxine XR (EFFEXOR-XR) 150 MG 24 hr capsule Take 1 capsule (150 mg total) by mouth at bedtime. 90 capsule 0  . meclizine (ANTIVERT) 25 MG tablet Take 1 tablet (25 mg total) by mouth as needed for dizziness. 30 tablet 1   No current facility-administered medications for this visit.     Social History   Social History  . Marital status: Single    Spouse name: N/A  . Number of children: N/A  . Years of education: N/A   Occupational History  . Not on file.   Social History Main Topics  . Smoking status: Current Every Day Smoker    Packs/day: 1.50    Years: 40.00    Types: Cigarettes  . Smokeless tobacco: Never Used     Comment: Cannot afford chantix. Working on getting discount from drug compny  . Alcohol use 0.0 oz/week     Comment: occ  . Drug use: No  . Sexual activity: Not on file   Other Topics Concern  . Not on file   Social History Narrative  . No narrative on file    Family History  Problem Relation Age of Onset  . Mitral valve prolapse Mother   . Heart Problems Mother   . Cancer Mother    Socially she is single. She has no children. She has been smoking for approximately 40 years and still smokes 1-11/2 pack per day per. 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   ROS General: Negative; No fevers, chills, or night sweats; positive for fatigue HEENT: Negative; No changes in vision or hearing, sinus congestion, difficulty swallowing Pulmonary: Positive for mild  shortness of breath with activity Cardiovascular: Negative; No chest pain, presyncope, syncope, palpitations GI: Negative; No nausea, vomiting, diarrhea, or abdominal pain  GU: Negative; No dysuria, hematuria, or difficulty voiding Musculoskeletal: Negative; no myalgias, joint pain, or weakness Hematologic/Oncology: Negative; no easy bruising, bleeding Endocrine: Negative; no heat/cold intolerance; no diabetes Neuro: Negative; no changes in balance, headaches Skin: Negative; No rashes or skin lesions Psychiatric: Negative; No behavioral problems, depression Sleep: Negative; No snoring, daytime sleepiness, hypersomnolence, bruxism, restless legs, hypnogognic hallucinations, no cataplexy Other comprehensive 14 point system review is negative.   PE BP (!) 92/54   Pulse 67   Ht 5' 2.5" (1.588 m)   Wt 162 lb 3.2 oz (73.6 kg)   LMP 06/25/2011   BMI 29.19 kg/m    Repeat blood pressure by me was 96/60 supine and 102/64 standing.  Wt Readings from Last 3 Encounters:  11/10/16 162 lb 3.2 oz (73.6 kg)  10/12/16 163 lb (73.9 kg)  08/12/16 168 lb (76.2 kg)   General: Alert, oriented, no distress.  Skin: normal turgor, no rashes, warm and dry HEENT: Normocephalic, atraumatic. Pupils equal round and reactive to light; sclera anicteric; extraocular muscles intact;  Nose without nasal septal hypertrophy Mouth/Parynx benign; Mallinpatti scale 3 Neck: No JVD, no carotid bruits; normal carotid upstroke Lungs: clear to ausculatation and percussion; no wheezing or rales Chest wall: without tenderness to palpitation Heart: PMI not displaced, RRR, s1 s2 normal, 1/6 systolic murmur, no click; no diastolic murmur, no rubs, gallops, thrills, or heaves Abdomen: soft, nontender; no hepatosplenomehaly, BS+; abdominal aorta nontender and not dilated by palpation. Back: no CVA tenderness Pulses 2+ Musculoskeletal: full range of motion, normal strength, no joint deformities Extremities: no clubbing cyanosis  or edema, Homan's sign negative  Neurologic: grossly nonfocal; Cranial nerves grossly wnl Psychologic: Normal mood and affect   ECG (independently read by me): Normal sinus rhythm at 67 bpm.  LVH by voltage.  Nonspecific ST changes.  QTc interval 445 ms.  May 2018 ECG (independently read by me): Normal sinus rhythm at 70 bpm.  Borderline voltage criteria for LVH.  QTc interval 451 ms.  PR interval normal.  Nose ST-T change.  August 2016 ECG (independently read by me): Normal sinus rhythm at 65 bpm.  Mild RV conduction delay.  No securing ST-T changes.  May 2015 ECG (independently read by me): Normal sinus rhythm at 67 bpm.  QTc interval 443 ms.  Nonspecific T-wave changes.  October 2014 ECG: Sinus rhythm at 69 beats per minute. Mild RV conduction delay. Normal intervals.  LABS: BMP Latest Ref Rng & Units 08/12/2016 03/13/2016 09/19/2015  Glucose 65 - 99 mg/dL 126(H) 82 87  BUN 6 - 24 mg/dL _0 Creatinine 0.57 - 1.00 mg/dL 1.02(H) 0.91 0.85  BUN/Creat Ratio 9 - 23 17 - -  Sodium 134 - 144 mmol/L 141 136 137  Potassium 3.5 - 5.2 mmol/L 4.9 3.5 4.2  Chloride 96 - 106 mmol/L 101 98(L) 101  CO2 20 - 29 mmol/L _1 Calcium 8.7 - 10.2 mg/dL 9.6 10.0 9.8   Hepatic Function Latest Ref Rng & Units 08/12/2016 08/20/2014 11/22/2013  Total Protein 6.0 - 8.5 g/dL 6.8 6.6 6.3  Albumin 3.5 - 5.5 g/dL 4.6 3.9 4.2  AST 0 - 40 IU/L _2 ALT 0 - 32 IU/L _3 Alk Phosphatase 39 - 117 IU/L 90 54 46  Total Bilirubin 0.0 - 1.2 mg/dL 0.3 0.47 0.5   CBC Latest Ref Rng & Units 08/12/2016 03/13/2016 08/20/2014  WBC 3.4 - 10.8 x10E3/uL 6.4 10.6(H) 5.6  Hemoglobin 11.1 - 15.9 g/dL 14.2  14.9 13.7  Hematocrit 34.0 - 46.6 % 41.9 43.4 40.3  Platelets 150 - 379 x10E3/uL 230 186 179   Lab Results  Component Value Date   MCV 93 08/12/2016   MCV 89.1 03/13/2016   MCV 94.1 08/20/2014   Lab Results  Component Value Date   TSH 0.804 08/12/2016   Lab Results  Component Value Date   HGBA1C 5.7  (H) 10/12/2016    Lipid Panel     Component Value Date/Time   CHOL 250 (H) 08/12/2016 1659   TRIG 134 08/12/2016 1659   HDL 61 08/12/2016 1659   CHOLHDL 4.1 08/12/2016 1659   CHOLHDL 2.9 11/22/2013 0904   VLDL 16 11/22/2013 0904   LDLCALC 162 (H) 08/12/2016 1659     RADIOLOGY: Dg Chest 2 View  11/10/2012   CLINICAL DATA:  Cough and chest pain  EXAM: CHEST  2 VIEW  COMPARISON:  July 30, 2012  FINDINGS: There is a calcified granuloma in the left lower lobe. Lungs are otherwise clear. Heart size and pulmonary vascularity are normal. No adenopathy. There are no appreciable bone lesions.  IMPRESSION: Granuloma left base. No edema or consolidation   Electronically Signed   By: Lowella Grip M.D.   On: 11/10/2012 15:22   Mr Shoulder Left Wo Contrast  11/12/2012   CLINICAL DATA:  Extreme shoulder pain for months. Prior shoulder surgery approximately 1 year ago.  EXAM: MRI OF THE LEFT SHOULDER WITHOUT CONTRAST  TECHNIQUE: Multiplanar, multisequence MR imaging of the shoulder was performed. No intravenous contrast was administered.  COMPARISON:  Left shoulder MRI 06/04/2011.  FINDINGS: Rotator cuff: Interval development of infraspinous tendinosis with hyperintensity extending to the musculotendinous junction. No focal tear is demonstrated. There is stable mild supraspinatus and subscapularis tendinosis.  Muscles:  No focal muscular atrophy or edema.  Biceps long head: The intra-articular portion of the biceps tendon is now diminutive with irregular signal consistent with tendinosis and partial tearing. No full-thickness tendon tear or tendon dislocation is identified.  Acromioclavicular Joint: The acromion is type 1. Patient has undergone interval distal clavicle resection and probable acromioplasty. There is no osseous encroachment on the rotator cuff passageway. There is no significant fluid in the subacromial -subdeltoid space.  Glenohumeral Joint: No significant shoulder joint effusion. The  previously demonstrated edema and cystic changes superiorly in the glenoid have improved. However, there is new edema and subchondral cyst formation in the anterior inferior glenoid.  Labrum: There is labral degeneration without evidence of discrete tear.  Bones: No significant extra-articular osseous findings. The cystic changes previously noted medial to the lesser tuberosity have improved.  IMPRESSION: 1. Interval distal clavicle resection and acromioplasty. No rotator cuff impingement identified. 2. Infraspinous tendinosis without evidence of rotator cuff tear. 3. New diminution of the long head of the biceps tendon consistent with partial tearing. 4. Progressive glenohumeral degenerative changes with new subchondral cyst formation and edema in the anterior inferior glenoid. No discrete labral tear identified.   Electronically Signed   By: Camie Patience M.D.   On: 11/12/2012 12:14    IMPRESSION:  1. Hyperlipidemia with target LDL less than 70   2. Medication management   3. Atherosclerosis of aorta (HCC)   4. Palpitations   5. Fibromyalgia   6. Tobacco abuse counseling   7. Fatigue, unspecified type   8. Grade I diastolic dysfunction     ASSESSMENT AND PLAN: Ms Navia is a 55 year old female who has a 60+ pack year history of tobacco use.  She  has a history of palpitations and recently these have been controlled with her current dose of atenolol 50 mg twice a day.  She has a history of bipolar disorder for which she takes Effexor and lamictal.  She is been undergoing CT imaging for surveillance for lung screening.  Her most recent study from 04/13/2016 showed a right lower lobe pulmonary nodule.  Incidentally, she was found to have atherosclerosis involving her left circumflex coronary artery as well as her thoracic aorta.  She admits to shortness of breath at times with activity and also has noticed some vague chest pain.  With her atherosclerosis noted on CT imaging, I recommended that she undergo  echo and nuclear stress test.  Her echo study from from June 2018 showed an EF of 60-65% with grade 1 diastolic dysfunction.  I reviewed her nuclear perfusion study.  Some 08/12/2016 which showed normal perfusion without scar or skiing.  Anemia.  I reviewed her most recent laboratory.  Apparently, she had stopped taking the Crestor.  Her  LDL was significantly increased at 162.  I had a long discussion with her today.  I am recommending a very slow attempt at reinstituting statin therapy and she will start Crestor 10 mg weekly for 2 weeks, then increase to 2 times a week for 2 weeks, then increase to 3 times per week for 2 weeks, and then every other day.  Her blood pressure is low.  I have recommended she reduce her Maxide from daily to either every other day or one half a pill daily.  I'm reducing her atenolol from 50 [place] grams twice a day to 50 mg in the morning and 25 mg at night.  I also have suggested she try taking coenzyme Q10 to see if this can potentially prior myalgias. I discussed good sleep hygiene. I discussed smoking cessation.  I will see her in 6 months for reevaluation.    Time spent: 25 minutes  Troy Sine, MD, [facility]  11/12/2016 12:46 PM

## 2016-11-10 NOTE — Patient Instructions (Signed)
Medication Instructions:  RESTART rosuvastatin (Crestor) ---take 1/2 tablet once weekly x 2 weeks ---increase to 1/2 tablet two times weekly x 2 weeks ---increase to 1/2 tablet three times weekly x 2 weeks ---increase to 1/2 tablet every other day  DECREASE triamterene-hctz (Maxzide) to 1/2 tablet daily  DECREASE atenolol to 50 mg (1 tablet) in the AM and 25mg  (1/2 tablet) in the evening.  Labwork: Please return for FASTING labs in 3 months (CMET,Lipid)  Our in office lab hours are Monday-Friday 8:00-4:30, closed for lunch 1-2 pm.  No appointment needed.  Follow-Up: Your physician wants you to follow-up in: 6 months with Dr. Claiborne Billings.  You will receive a reminder letter in the mail two months in advance. If you don't receive a letter, please call our office to schedule the follow-up appointment.   Any Other Special Instructions Will Be Listed Below (If Applicable).     If you need a refill on your cardiac medications before your next appointment, please call your pharmacy.

## 2016-11-16 ENCOUNTER — Other Ambulatory Visit: Payer: Self-pay | Admitting: Physician Assistant

## 2016-11-16 DIAGNOSIS — M19012 Primary osteoarthritis, left shoulder: Secondary | ICD-10-CM | POA: Diagnosis not present

## 2016-11-22 ENCOUNTER — Telehealth: Payer: Self-pay | Admitting: Family Medicine

## 2016-11-22 NOTE — Telephone Encounter (Signed)
PT NEED A REFILL ON GENERIC NEXIUM

## 2016-11-23 ENCOUNTER — Telehealth: Payer: Self-pay | Admitting: Physician Assistant

## 2016-11-23 NOTE — Telephone Encounter (Signed)
[facility] called today 11/23/16 to check the status of the following for the pt.  Montelukast - 10mg  Triamterene-hctz Accu Check Aviva Meter 90 day supply of test strips Lancets Single use alcohol swabs aviva solution  [employer] stated that they faxed over original request on 11/16/16 and again within 72 hours.  Please advise [place] at [phone]

## 2016-11-24 ENCOUNTER — Other Ambulatory Visit: Payer: Self-pay

## 2016-11-24 MED ORDER — MONTELUKAST SODIUM 10 MG PO TABS: 10.0000 mg | ORAL_TABLET | Freq: Every day | ORAL | 0 refills | Status: DC

## 2016-11-24 NOTE — Telephone Encounter (Signed)
IC PT, LMOVM - we have not prescribed at this office.  Advised it is over the counter.  If needed, have Chelle prescribe at next visit.

## 2016-11-24 NOTE — Telephone Encounter (Signed)
Called [facility]. Refused all meds except monteklaust which I refilled electronically. Pt needs office visit.

## 2016-11-30 ENCOUNTER — Ambulatory Visit: Payer: Medicare HMO | Admitting: Physician Assistant

## 2016-11-30 ENCOUNTER — Encounter: Payer: Self-pay | Admitting: Physician Assistant

## 2016-11-30 VITALS — BP 110/60 | HR 72 | Temp 99.1°F | Resp 18 | Ht 62.5 in | Wt 161.6 lb

## 2016-11-30 DIAGNOSIS — I10 Essential (primary) hypertension: Secondary | ICD-10-CM | POA: Diagnosis not present

## 2016-11-30 DIAGNOSIS — R7303 Prediabetes: Secondary | ICD-10-CM | POA: Diagnosis not present

## 2016-11-30 DIAGNOSIS — J449 Chronic obstructive pulmonary disease, unspecified: Secondary | ICD-10-CM

## 2016-11-30 DIAGNOSIS — M1990 Unspecified osteoarthritis, unspecified site: Secondary | ICD-10-CM | POA: Diagnosis not present

## 2016-11-30 DIAGNOSIS — J302 Other seasonal allergic rhinitis: Secondary | ICD-10-CM

## 2016-11-30 DIAGNOSIS — G894 Chronic pain syndrome: Secondary | ICD-10-CM

## 2016-11-30 MED ORDER — MONTELUKAST SODIUM 10 MG PO TABS: 10.0000 mg | ORAL_TABLET | Freq: Every day | ORAL | 3 refills | Status: DC

## 2016-11-30 MED ORDER — FLUTICASONE-SALMETEROL 250-50 MCG/DOSE IN AEPB: 1.0000 | INHALATION_SPRAY | Freq: Two times a day (BID) | RESPIRATORY_TRACT | 3 refills | Status: DC

## 2016-11-30 MED ORDER — BLOOD GLUCOSE MONITOR KIT: PACK | 0 refills | Status: DC

## 2016-11-30 MED ORDER — ALBUTEROL SULFATE HFA 108 (90 BASE) MCG/ACT IN AERS: 2.0000 | INHALATION_SPRAY | RESPIRATORY_TRACT | 0 refills | Status: DC | PRN

## 2016-11-30 MED ORDER — HYDROCODONE-ACETAMINOPHEN 5-325 MG PO TABS: ORAL_TABLET | ORAL | 0 refills | Status: DC

## 2016-11-30 MED ORDER — TRIAMTERENE-HCTZ 37.5-25 MG PO TABS: 0.5000 | ORAL_TABLET | Freq: Every day | ORAL | 3 refills | Status: AC

## 2016-11-30 NOTE — Progress Notes (Signed)
Patient ID: Anita Arroyo, female    DOB: January 23, 1961, 56 y.o.   MRN: [id number]  PCP: Porfirio Oar, PA-C  Chief Complaint  Patient presents with  . Pain    Pt states pain has been the same since last visit.  . Follow-up    4 month follow up  . Medication Refill    NORCO, ADVAIR, Proventil, Triamterene-HCTZ     Subjective:   Presents for medication refills.  When she requested refills, was advised that she needed a follow-up visit. Unfortunately, that was not the case, and as such has been out of Maxzide x 2-3 weeks.  She had a steroid injection in the shoulder with orthopedics, which helped initially, but the pain has recurred. She was also found to have DDD on the c-spine, and may need a CT scan.  She is trying statin therapy. Cardiology recommended that she start with 1/2 tablet of rosuvastatin QOD and work up to the whole tablet daily. She's trying.  [facility] reviewed. Last 6 months of prescriptions all known to me. No red flags. Last hydrocodone fill 10/22/2016.  Review of Systems No chest pain, SOB, HA, dizziness, vision change, N/V, diarrhea, constipation, dysuria, urinary urgency or frequency, myalgias, arthralgias or rash. Sometimes feels sweaty and lightheaded, but hasn't received glucose testing materials to check.    Patient Active Problem List   Diagnosis Date Noted  . Prediabetes 10/21/2016  . Headache 10/12/2016  . 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 Chronic pain syndrome 05/27/2016  . Osteoarthritis 05/16/2016  . Seasonal allergies 05/03/2016  . History of palpitations 09/16/2015  . Bipolar 1 disorder (HCC) 09/16/2015  . History of breast cancer 11/16/2012  . Fibromyalgia 11/16/2012  . Smoker 11/16/2012  . INTERTRIGO 04/13/2010  . Hyperlipidemia 09/02/2006  . DEPRESSION 09/02/2006  . Essential hypertension 09/02/2006  . COPD (chronic obstructive pulmonary disease) (HCC) 09/02/2006  . GERD 09/02/2006     Prior to Admission medications    Medication Sig Start Date End Date Taking? Authorizing Provider  albuterol (PROVENTIL HFA;VENTOLIN HFA) 108 (90 Base) MCG/ACT inhaler Inhale 2 puffs into the lungs every 4 (four) hours as needed for wheezing or shortness of breath (cough, shortness of breath or wheezing.). 10/18/16  Yes Brady Schiller, PA-C  atenolol (TENORMIN) 50 MG tablet Take 50 mg in the morning and 25 mg in the evening 11/10/16  Yes Lennette Bihari, MD  Biotin (BIOTIN 5000) 5 MG CAPS Take 5 mg by mouth daily.   Yes [provider]  cetirizine (ZYRTEC) 10 MG tablet Take 10 mg by mouth daily.    Yes [provider]  fluticasone (FLONASE) 50 MCG/ACT nasal spray USE 1 SPRAY IN EACH NOSTRIL TWICE DAILY 11/16/16  Yes Kamara Allan, PA-C  Fluticasone-Salmeterol (ADVAIR) 250-50 MCG/DOSE AEPB Inhale 1 puff into the lungs 2 (two) times daily.   Yes [provider]  HYDROcodone-acetaminophen (NORCO/VICODIN) 5-325 MG tablet take 1 TABLET BY MOUTH EVERY 6 HOURS AS NEEDED for moderate pain 10/12/16  Yes Emonie Espericueta, PA-C  lamoTRIgine (LAMICTAL) 150 MG tablet Take 1 tablet (150 mg total) by mouth 2 (two) times daily. 09/08/16  Yes Arfeen, Phillips Grout, MD  Magnesium 250 MG TABS Take 250 mg by mouth daily.   Yes [provider]  meclizine (ANTIVERT) 25 MG tablet Take 1 tablet (25 mg total) by mouth as needed for dizziness. 11/10/16  Yes Lennette Bihari, MD  montelukast (SINGULAIR) 10 MG tablet Take 1 tablet (10 mg total) by mouth  at bedtime. 11/24/16  Yes Juel Ripley, PA-C  pantoprazole (PROTONIX) 40 MG tablet Take 1 tablet (40 mg total) by mouth daily. 07/27/16  Yes Suhayla Chisom, PA-C  traZODone (DESYREL) 100 MG tablet Take 2 tablets (200 mg total) by mouth at bedtime. 09/08/16  Yes Arfeen, Phillips Grout, MD  triamterene-hydrochlorothiazide (MAXZIDE-25) 37.5-25 MG tablet Take 0.5 tablets by mouth daily.   Yes [provider]  venlafaxine XR (EFFEXOR-XR) 150 MG 24 hr capsule Take 1 capsule (150 mg  total) by mouth at bedtime. 09/08/16  Yes Arfeen, Phillips Grout, MD  rosuvastatin (CRESTOR) 20 MG tablet Take 1 tablet (20 mg total) by mouth daily. 08/19/16 11/17/16  Lennette Bihari, MD     Allergies  Allergen Reactions  . Contrast Media [Iodinated Diagnostic Agents] Shortness Of Breath  . Iodine Shortness Of Breath  . Adhesive [Tape] Other (See Comments)    Reaction:  Blisters   . Celecoxib     "I climb the walls"  . Statins     Body aches, constipation  . Ceclor [Cefaclor] Rash  . Moxifloxacin Rash  . Sulfonamide Derivatives Nausea And Vomiting       Objective:  Physical Exam  Constitutional: She is oriented to person, place, and time. She appears well-developed and well-nourished. She is active and cooperative. No distress.  BP 110/60 (BP Location: Left Arm, Patient Position: Sitting, Cuff Size: Normal)   Pulse 72   Temp 99.1 F (37.3 C) (Oral)   Resp 18   Ht 5' 2.5" (1.588 m)   Wt 161 lb 9.6 oz (73.3 kg)   LMP 06/25/2011   SpO2 95%   BMI 29.09 kg/m   HENT:  Head: Normocephalic and atraumatic.  Right Ear: Hearing normal.  Left Ear: Hearing normal.  Eyes: Conjunctivae are normal. No scleral icterus.  Neck: Normal range of motion. Neck supple. No thyromegaly present.  Cardiovascular: Normal rate and regular rhythm. Exam reveals no gallop and no friction rub.  Murmur heard. Pulses:      Radial pulses are 2+ on the right side, and 2+ on the left side.  Pulmonary/Chest: Effort normal and breath sounds normal.  Lymphadenopathy:       Head (right side): No tonsillar, no preauricular, no posterior auricular and no occipital adenopathy present.       Head (left side): No tonsillar, no preauricular, no posterior auricular and no occipital adenopathy present.    She has no cervical adenopathy.       Right: No supraclavicular adenopathy present.       Left: No supraclavicular adenopathy present.  Neurological: She is alert and oriented to person, place, and time. No sensory  deficit.  Skin: Skin is warm, dry and intact. No rash noted. No cyanosis or erythema. Nails show no clubbing.  Psychiatric: She has a normal mood and affect. Her speech is normal and behavior is normal.         Assessment & Plan:   Problem List Items Addressed This Visit    Essential hypertension    Well controlled, even over controlled sometimes. Working with cardiology on medication adjustments.      Relevant Medications   triamterene-hydrochlorothiazide (MAXZIDE-25) 37.5-25 MG tablet   COPD (chronic obstructive pulmonary disease) (HCC)    Encouraged smoking cessation. Refill Advair and new Rx for albuterol.      Relevant Medications   montelukast (SINGULAIR) 10 MG tablet   albuterol (PROVENTIL HFA;VENTOLIN HFA) 108 (90 Base) MCG/ACT inhaler   Fluticasone-Salmeterol (ADVAIR) 250-50 MCG/DOSE AEPB  Seasonal allergies    Refilled montelukast. Continue cetirizine. Encouraged smoking cessation.      Relevant Medications   montelukast (SINGULAIR) 10 MG tablet   Osteoarthritis    Follow-up per orthopedics. Refill hydrocodone.      Relevant Medications   HYDROcodone-acetaminophen (NORCO/VICODIN) 5-325 MG tablet   Chronic pain syndrome   Relevant Medications   HYDROcodone-acetaminophen (NORCO/VICODIN) 5-325 MG tablet   Other Relevant Orders   ToxASSURE Select 13 (MW), Urine   Prediabetes - Primary    Healthy lifestyle changes. Glucometer and supplies for home monitoring, especially when she feels bad.      Relevant Medications   blood glucose meter kit and supplies KIT       Return for for re-evaluation as previously planned.   Fernande Bras, PA-C [facility]

## 2016-11-30 NOTE — Assessment & Plan Note (Signed)
Encouraged smoking cessation. Refill Advair and new Rx for albuterol.

## 2016-11-30 NOTE — Assessment & Plan Note (Signed)
Healthy lifestyle changes. Glucometer and supplies for home monitoring, especially when she feels bad.

## 2016-11-30 NOTE — Assessment & Plan Note (Signed)
Well controlled, even over controlled sometimes. Working with cardiology on medication adjustments.

## 2016-11-30 NOTE — Assessment & Plan Note (Signed)
Follow-up per orthopedics. Refill hydrocodone.

## 2016-11-30 NOTE — Progress Notes (Signed)
Subjective:    Patient ID: Anita Arroyo, female    DOB: January 16, 1961, 56 y.o.   MRN: [id number]  HPI  Anita Arroyo is a 56 year old Caucasian female with a past medical history significant for chronic pain syndrome, hyperlipidemia, depression, bipolar 1 disorder, hypertension, COPD, GERD, and prediabetes who presents today for follow-up on chronic pain and medication refills. She states she has been out of her Triamterene-HCTZ for the past 2-3 weeks. She states she also did not receive at home blood glucose monitor. Anita Arroyo states some times during the day she will get really sweaty and a little lightheaded. These symptoms will resolve when she eats a snack.   Anita Arroyo she received a shot in her shoulder 2-3 weeks ago at the orthopedist. She reports the pain improved for about 1.5 weeks, but now returned. She states an x-ray 2-3 months ago showed degenerative disc disease and she was told to call back if no improvement and they will order a CT.  Anita Arroyo states moving her arm makes the pain worse and if she does not move it the pain becomes a dull throb that is a 1/10. Anita Arroyo states the pain is a 5-6/10 with active range of motion. She describes the pain is a sharp pain.   Anita Arroyo needs a refill of her Singulair and Triamterene.   She would also like her Advair and Albuterol (Ventolin) filled locally because Proventil is not covered.    Review of Systems   Medications:  Prior to Admission medications   Medication Sig Start Date End Date Taking? Authorizing Provider  albuterol (PROVENTIL HFA;VENTOLIN HFA) 108 (90 Base) MCG/ACT inhaler Inhale 2 puffs into the lungs every 4 (four) hours as needed for wheezing or shortness of breath (cough, shortness of breath or wheezing.). 10/18/16  Yes Jeffery, Chelle, PA-C  atenolol (TENORMIN) 50 MG tablet Take 50 mg in the morning and 25 mg in the evening 11/10/16  Yes Troy Sine, MD  Biotin (BIOTIN 5000) 5 MG CAPS Take 5 mg by mouth daily.   Yes [provider]  cetirizine (ZYRTEC) 10 MG tablet Take 10 mg by mouth daily.    Yes [provider]  fluticasone (FLONASE) 50 MCG/ACT nasal spray USE 1 SPRAY IN EACH NOSTRIL TWICE DAILY 11/16/16  Yes Jeffery, Chelle, PA-C  Fluticasone-Salmeterol (ADVAIR) 250-50 MCG/DOSE AEPB Inhale 1 puff into the lungs 2 (two) times daily.   Yes [provider]  HYDROcodone-acetaminophen (NORCO/VICODIN) 5-325 MG tablet take 1 TABLET BY MOUTH EVERY 6 HOURS AS NEEDED for moderate pain 10/12/16  Yes Jeffery, Chelle, PA-C  lamoTRIgine (LAMICTAL) 150 MG tablet Take 1 tablet (150 mg total) by mouth 2 (two) times daily. 09/08/16  Yes Arfeen, Arlyce Harman, MD  Magnesium 250 MG TABS Take 250 mg by mouth daily.   Yes [provider]  meclizine (ANTIVERT) 25 MG tablet Take 1 tablet (25 mg total) by mouth as needed for dizziness. 11/10/16  Yes Troy Sine, MD  montelukast (SINGULAIR) 10 MG tablet Take 1 tablet (10 mg total) by mouth at bedtime. 11/24/16  Yes Jeffery, Chelle, PA-C  pantoprazole (PROTONIX) 40 MG tablet Take 1 tablet (40 mg total) by mouth daily. 07/27/16  Yes Jeffery, Chelle, PA-C  traZODone (DESYREL) 100 MG tablet Take 2 tablets (200 mg total) by mouth at bedtime. 09/08/16  Yes Arfeen, Arlyce Harman, MD  triamterene-hydrochlorothiazide (MAXZIDE-25) 37.5-25 MG tablet Take 0.5 tablets by mouth daily.   Yes [provider]  venlafaxine XR (EFFEXOR-XR) 150 MG 24 hr capsule Take 1 capsule (150 mg total) by mouth at bedtime. 09/08/16  Yes Arfeen, Arlyce Harman, MD  rosuvastatin (CRESTOR) 20 MG tablet Take 1 tablet (20 mg total) by mouth daily. 08/19/16 11/17/16  Troy Sine, MD   Allergies:  Allergies  Allergen Reactions  . Contrast Media [Iodinated Diagnostic Agents] Shortness Of Breath  . Iodine Shortness Of Breath  . Adhesive [Tape] Other (See Comments)    Reaction:  Blisters   . Celecoxib     "I climb the walls"  . Statins     Body aches, constipation  . Ceclor [Cefaclor] Rash  .  Moxifloxacin Rash  . Sulfonamide Derivatives Nausea And Vomiting   Chronic Medical Conditions:  Patient Active Problem List   Diagnosis Date Noted  . Prediabetes 10/21/2016  . Headache 10/12/2016  . 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 Chronic pain syndrome 05/27/2016  . Osteoarthritis 05/16/2016  . Seasonal allergies 05/03/2016  . History of palpitations 09/16/2015  . Bipolar 1 disorder ([place]) 09/16/2015  . History of breast cancer 11/16/2012  . Fibromyalgia 11/16/2012  . Smoker 11/16/2012  . INTERTRIGO 04/13/2010  . Hyperlipidemia 09/02/2006  . DEPRESSION 09/02/2006  . Essential hypertension 09/02/2006  . COPD (chronic obstructive pulmonary disease) ([place]) 09/02/2006  . GERD 09/02/2006      Objective:   Physical Exam  Constitutional:  BP 110/60 (BP Location: Left Arm, Patient Position: Sitting, Cuff Size: Normal)   Pulse 72   Temp 99.1 F (37.3 C) (Oral)   Resp 18   Ht 5' 2.5" (1.588 m)   Wt 161 lb 9.6 oz (73.3 kg)   LMP 06/25/2011   SpO2 95%   BMI 29.09 kg/m            Assessment & Plan:

## 2016-11-30 NOTE — Patient Instructions (Signed)
     IF you received an x-ray today, you will receive an invoice from [facility]. Please contact [facility] at [phone] with questions or concerns regarding your invoice.   IF you received labwork today, you will receive an invoice from [facility]. Please contact [facility] at [phone] with questions or concerns regarding your invoice.   Our billing staff will not be able to assist you with questions regarding bills from these companies.  You will be contacted with the lab results as soon as they are available. The fastest way to get your results is to activate your My Chart account. Instructions are located on the last page of this paperwork. If you have not heard from us regarding the results in 2 weeks, please contact this office.     

## 2016-11-30 NOTE — Assessment & Plan Note (Signed)
Refilled montelukast. Continue cetirizine. Encouraged smoking cessation.

## 2016-12-04 LAB — TOXASSURE SELECT 13 (MW), URINE

## 2016-12-09 ENCOUNTER — Encounter (HOSPITAL_COMMUNITY): Payer: Self-pay | Admitting: Psychiatry

## 2016-12-09 ENCOUNTER — Ambulatory Visit (INDEPENDENT_AMBULATORY_CARE_PROVIDER_SITE_OTHER): Payer: Medicare HMO | Admitting: Psychiatry

## 2016-12-09 DIAGNOSIS — F17210 Nicotine dependence, cigarettes, uncomplicated: Secondary | ICD-10-CM

## 2016-12-09 DIAGNOSIS — F419 Anxiety disorder, unspecified: Secondary | ICD-10-CM | POA: Diagnosis not present

## 2016-12-09 DIAGNOSIS — M549 Dorsalgia, unspecified: Secondary | ICD-10-CM

## 2016-12-09 DIAGNOSIS — F3162 Bipolar disorder, current episode mixed, moderate: Secondary | ICD-10-CM

## 2016-12-09 MED ORDER — TRAZODONE HCL 100 MG PO TABS: 200.0000 mg | ORAL_TABLET | Freq: Every day | ORAL | 0 refills | Status: DC

## 2016-12-09 MED ORDER — VENLAFAXINE HCL ER 150 MG PO CP24: 150.0000 mg | ORAL_CAPSULE | Freq: Every day | ORAL | 0 refills | Status: DC

## 2016-12-09 MED ORDER — LAMOTRIGINE 150 MG PO TABS: 150.0000 mg | ORAL_TABLET | Freq: Two times a day (BID) | ORAL | 0 refills | Status: DC

## 2016-12-09 NOTE — Progress Notes (Signed)
[place] MD/PA/NP OP Progress Note  12/09/2016 10:18 AM Anita Arroyo  MRN:  [id number]  Chief Complaint: I am taking the medication.  My shoulder not getting better.  I am in pain.  HPI: Patient came for her follow-up appointment.  She is compliant with Lamictal, trazodone and Effexor.  She has no side effects including rash or itching.  She endorsed lately her left shoulder pain is getting worse.  She recently seen a physician at [facility] primary care.  She is getting pain medication.  She denies any irritability or any mania.  She continues to do a dog walking.  She enjoys working.  She denies any crying spells or any feeling of hopelessness or worthlessness.  She is scheduled to see again her primary care physician in December because she was told she has prediabetes and her cholesterol was high.  She is taking statins but she is not happy because sometimes it causes muscle cramp.  Her energy level is good.  Her appetite is okay.  She is trying to lose weight.  Patient denies any anxiety or panic attack.  She feels her anxiety and mania is a stable on her current psychiatric medication.  She is excited because her mother is moving to assisted living facility in February.  She plans to visit her biological and adopted mother on Thanksgiving.  Patient denies drinking alcohol or using any illegal substances.   Visit Diagnosis:    ICD-10-CM   1. Bipolar 1 disorder, mixed, moderate (HCC) F31.62 venlafaxine XR (EFFEXOR-XR) 150 MG 24 hr capsule    traZODone (DESYREL) 100 MG tablet    lamoTRIgine (LAMICTAL) 150 MG tablet    Past Psychiatric History: Reviewed. Patient has history of bipolar disorder. She had a history of mood swings, anger, impulsive behavior. She had tried Depakote, Prozac, Paxil, Zoloft and Topamax. She was seen at [facility] for more than 10 years but she was not happy there. Patient denies any history of physical, sexual or verbal abuse. She was also diagnosed with ADD but a stimulant cause  worsening of bipolar disorder. She had tried Ritalin and Adderall in the past.  Past Medical History:  Past Medical History:  Diagnosis Date  . Arthritis   . Asthma   . Bipolar 1 disorder ([place])   . Bipolar 1 disorder ([place])   . Breast cancer ([place])   . Depression   . Dysrhythmia    palpitations  . Dysrhythmia    atrial fibrillation  . Fibromyalgia   . Finger fracture 2011  . GERD (gastroesophageal reflux disease)   . Heart murmur   . History of stress test 04/28/2011   showed normal perfusion without scar or ischemia  . Hx of echocardiogram 04/28/2011   showed normal systolic function with mild diastolic dysfunction,she had trace MR but did not have frank mitral valve prolapse demonstrated. She had mild pulmonary hypertension with an estimated RV systolic pressure at 34 mm.  . Hyperlipemia   . Meniere disease   . MVP (mitral valve prolapse)   . Neuromuscular disorder ([place])    fibromyalgia    Past Surgical History:  Procedure Laterality Date  . ANKLE SURGERY     left x4  . BREAST SURGERY  2011   lumpectomy right breast   . COLONOSCOPY WITH PROPOFOL N/A 02/25/2014   Procedure: COLONOSCOPY WITH PROPOFOL;  Surgeon: Garlan Fair, MD;  Location: [facility];  Service: Endoscopy;  Laterality: N/A;  . deviated septum    . ECTOPIC PREGNANCY SURGERY    .  ELBOW SURGERY     right elbow  . EYE SURGERY     cataract surgery  . fibroid     2-3 fibroid adenomas removed  . JOINT REPLACEMENT  2011   right knee   . KNEE SURGERY     left knee  . SHOULDER ARTHROSCOPY WITH BICEPSTENOTOMY Right 09/25/2015   Procedure: SHOULDER ARTHROSCOPY WITH BICEPSTENOTOMY;  Surgeon: Ninetta Lights, MD;  Location: [place];  Service: Orthopedics;  Laterality: Right;  . SHOULDER ARTHROSCOPY WITH DISTAL CLAVICLE RESECTION Right 09/25/2015   Procedure: SHOULDER ARTHROSCOPY WITH DISTAL CLAVICLE RESECTION;  Surgeon: Ninetta Lights, MD;  Location: [place];  Service:  Orthopedics;  Laterality: Right;  . SHOULDER ARTHROSCOPY WITH SUBACROMIAL DECOMPRESSION Right 09/25/2015   Procedure: RIGHT SHOULDER ARTHROSCOPY DEBRIDEMENT,ACROMIOPLASTY,DISTAL CLAVICAL EXCISION, RELEASE OF BICEPS TENDON;  Surgeon: Ninetta Lights, MD;  Location: [place];  Service: Orthopedics;  Laterality: Right;  . TMJ ARTHROPLASTY    . TONSILLECTOMY    . TOTAL SHOULDER ARTHROPLASTY Left 01/05/2016  . UTERINE FIBROID SURGERY     polups and fibroids removed   . WRIST SURGERY     left    Family Psychiatric History: Reviewed.  Family History:  Family History  Problem Relation Age of Onset  . Mitral valve prolapse Mother   . Heart Problems Mother   . Cancer Mother     Social History:  Social History   Socioeconomic History  . Marital status: Single    Spouse name: Not on file  . Number of children: Not on file  . Years of education: Not on file  . Highest education level: Not on file  Social Needs  . Financial resource strain: Not on file  . Food insecurity - worry: Not on file  . Food insecurity - inability: Not on file  . Transportation needs - medical: Not on file  . Transportation needs - non-medical: Not on file  Occupational History  . Not on file  Tobacco Use  . Smoking status: Current Every Day Smoker    Packs/day: 1.50    Years: 40.00    Pack years: 60.00    Types: Cigarettes  . Smokeless tobacco: Never Used  . Tobacco comment: Cannot afford chantix. Working on getting discount from drug compny  Substance and Sexual Activity  . Alcohol use: Yes    Alcohol/week: 0.0 oz    Comment: occ  . Drug use: No  . Sexual activity: Not on file  Other Topics Concern  . Not on file  Social History Narrative  . Not on file    Allergies:  Allergies  Allergen Reactions  . Contrast Media [Iodinated Diagnostic Agents] Shortness Of Breath  . Iodine Shortness Of Breath  . Adhesive [Tape] Other (See Comments)    Reaction:  Blisters   . Celecoxib      "I climb the walls"  . Statins     Body aches, constipation  . Ceclor [Cefaclor] Rash  . Moxifloxacin Rash  . Sulfonamide Derivatives Nausea And Vomiting    Metabolic Disorder Labs: Lab Results  Component Value Date   HGBA1C 5.7 (H) 10/12/2016   No results found for: PROLACTIN Lab Results  Component Value Date   CHOL 250 (H) 08/12/2016   TRIG 134 08/12/2016   HDL 61 08/12/2016   CHOLHDL 4.1 08/12/2016   VLDL 16 11/22/2013   LDLCALC 162 (H) 08/12/2016   LDLCALC 79 11/22/2013   Lab Results  Component Value Date  TSH 0.804 08/12/2016   TSH 2.129 11/22/2013    Therapeutic Level Labs: No results found for: LITHIUM No results found for: VALPROATE No components found for:  CBMZ  Current Medications: Current Outpatient Medications  Medication Sig Dispense Refill  . albuterol (PROVENTIL HFA;VENTOLIN HFA) 108 (90 Base) MCG/ACT inhaler Inhale 2 puffs every 4 (four) hours as needed into the lungs for wheezing or shortness of breath (cough, shortness of breath or wheezing.). 3 Inhaler 0  . atenolol (TENORMIN) 50 MG tablet Take 50 mg in the morning and 25 mg in the evening 160 tablet 3  . Biotin (BIOTIN 5000) 5 MG CAPS Take 5 mg by mouth daily.    . blood glucose meter kit and supplies KIT Dispense based on patient and insurance preference. Use daily as directed. (FOR ICD-9 250.00, 250.01). 1 each 0  . cetirizine (ZYRTEC) 10 MG tablet Take 10 mg by mouth daily.     . fluticasone (FLONASE) 50 MCG/ACT nasal spray USE 1 SPRAY IN EACH NOSTRIL TWICE DAILY 48 g 1  . Fluticasone-Salmeterol (ADVAIR) 250-50 MCG/DOSE AEPB Inhale 1 puff 2 (two) times daily into the lungs. 180 each 3  . HYDROcodone-acetaminophen (NORCO/VICODIN) 5-325 MG tablet take 1 TABLET BY MOUTH EVERY 6 HOURS AS NEEDED for moderate pain 120 tablet 0  . lamoTRIgine (LAMICTAL) 150 MG tablet Take 1 tablet (150 mg total) by mouth 2 (two) times daily. 180 tablet 0  . Magnesium 250 MG TABS Take 250 mg by mouth daily.    .  meclizine (ANTIVERT) 25 MG tablet Take 1 tablet (25 mg total) by mouth as needed for dizziness. 30 tablet 1  . montelukast (SINGULAIR) 10 MG tablet Take 1 tablet (10 mg total) at bedtime by mouth. 90 tablet 3  . pantoprazole (PROTONIX) 40 MG tablet Take 1 tablet (40 mg total) by mouth daily. 90 tablet 1  . rosuvastatin (CRESTOR) 20 MG tablet Take 1 tablet (20 mg total) by mouth daily. 30 tablet 3  . traZODone (DESYREL) 100 MG tablet Take 2 tablets (200 mg total) by mouth at bedtime. 180 tablet 0  . triamterene-hydrochlorothiazide (MAXZIDE-25) 37.5-25 MG tablet Take 0.5 tablets daily by mouth. 90 tablet 3  . venlafaxine XR (EFFEXOR-XR) 150 MG 24 hr capsule Take 1 capsule (150 mg total) by mouth at bedtime. 90 capsule 0   No current facility-administered medications for this visit.      Musculoskeletal: Strength & Muscle Tone: within normal limits Gait & Station: normal Patient leans: N/A  Psychiatric Specialty Exam: Review of Systems  HENT: Negative.   Musculoskeletal: Positive for back pain.       Shoulder pain  Skin: Negative.  Negative for itching and rash.  Neurological: Negative.     Blood pressure 130/78, pulse 68, height 5' 2.5" (1.588 m), weight 167 lb 9.6 oz (76 kg), last menstrual period 06/25/2011.There is no height or weight on file to calculate BMI.  General Appearance: Casual  Eye Contact:  Good  Speech:  Clear and Coherent  Volume:  Normal  Mood:  Euthymic  Affect:  Congruent  Thought Process:  Goal Directed  Orientation:  Full (Time, Place, and Person)  Thought Content: Logical   Suicidal Thoughts:  No  Homicidal Thoughts:  No  Memory:  Immediate;   Good Recent;   Good Remote;   Good  Judgement:  Good  Insight:  Good  Psychomotor Activity:  Normal  Concentration:  Concentration: Good and Attention Span: Good  Recall:  Good  Fund of Knowledge:  Good  Language: Good  Akathisia:  No  Handed:  Right  AIMS (if indicated): not done  Assets:  Communication  Skills Desire for Improvement Housing Resilience  ADL's:  Intact  Cognition: WNL  Sleep:  Good   Screenings: PHQ2-9     Office Visit from 11/30/2016 in Primary Care at [place] from 10/12/2016 in Primary Care at [place] from 06/08/2016 in [place] at [place] from 03/29/2016 in [place] at [place] from 03/23/2016 in [place] at [facility] Total Score  0  0  0  0  0       Assessment and Plan: Bipolar disorder type I.  Anxiety disorder NOS.  I review collateral information from other providers including current medication.  She is taking narcotic pain medication and we discussed introduction of a psychiatric medication.  She does not want to change her medication.  She has no rash, itching tremors or shakes from Lamictal.  Continue Lamictal 200 mg daily, Effexor XR 150 mg daily and trazodone 300 mg at bedtime.  Discussed medication side effects and benefits.  Discussed healthy lifestyle and recommended to watch her calorie intake and do regular exercise.  Patient is scheduled to have blood work in the summer at her primary care physician.  Recommended to call us back if she has any question or any concern.  Follow-up in 3 months.  Alechia Lezama T., MD 12/09/2016, 10:18 AM

## 2016-12-28 ENCOUNTER — Other Ambulatory Visit: Payer: Self-pay | Admitting: Orthopedic Surgery

## 2016-12-28 DIAGNOSIS — M25512 Pain in left shoulder: Secondary | ICD-10-CM

## 2017-01-07 ENCOUNTER — Telehealth: Payer: Self-pay | Admitting: Physician Assistant

## 2017-01-07 ENCOUNTER — Ambulatory Visit: Payer: Medicare HMO | Admitting: Physician Assistant

## 2017-01-07 DIAGNOSIS — G894 Chronic pain syndrome: Secondary | ICD-10-CM

## 2017-01-07 NOTE — Telephone Encounter (Signed)
Pt requesting refill on Hydrocodone- actaminophen.

## 2017-01-07 NOTE — Telephone Encounter (Signed)
Copied from [place]. Topic: Quick Communication - See Telephone Encounter >> Jan 07, 2017  1:16 PM Burnis Medin, NT wrote: CRM for notification. See Telephone encounter for: Pt is calling for a written prescription for HYDROcodone-acetaminophen (NORCO/VICODIN) 5-325 MG tablet. Pt would like a call back when prescription is ready for pick up.  01/07/17.

## 2017-01-08 ENCOUNTER — Other Ambulatory Visit: Payer: Self-pay

## 2017-01-09 ENCOUNTER — Other Ambulatory Visit: Payer: Self-pay

## 2017-01-10 MED ORDER — HYDROCODONE-ACETAMINOPHEN 5-325 MG PO TABS: ORAL_TABLET | ORAL | 0 refills | Status: DC

## 2017-01-10 NOTE — Telephone Encounter (Signed)
Rx sent electronically.  Meds ordered this encounter  Medications  . HYDROcodone-acetaminophen (NORCO/VICODIN) 5-325 MG tablet    Sig: take 1 TABLET BY MOUTH EVERY 6 HOURS AS NEEDED for moderate pain    Dispense:  120 tablet    Refill:  0    Order Specific Question:   Supervising Provider    Answer:   Brigitte Pulse, EVA N [[id number]]

## 2017-01-16 ENCOUNTER — Ambulatory Visit
Admission: RE | Admit: 2017-01-16 | Discharge: 2017-01-16 | Disposition: A | Discharge status: Home / Self Care | Payer: Medicare HMO | Source: Ambulatory Visit | Attending: Orthopedic Surgery | Admitting: Orthopedic Surgery

## 2017-01-16 DIAGNOSIS — M25512 Pain in left shoulder: Secondary | ICD-10-CM

## 2017-01-16 DIAGNOSIS — R200 Anesthesia of skin: Secondary | ICD-10-CM | POA: Diagnosis not present

## 2017-01-19 ENCOUNTER — Ambulatory Visit: Payer: Medicare HMO | Admitting: Physician Assistant

## 2017-01-19 DIAGNOSIS — M19012 Primary osteoarthritis, left shoulder: Secondary | ICD-10-CM | POA: Diagnosis not present

## 2017-01-28 ENCOUNTER — Encounter: Payer: Self-pay | Admitting: Physician Assistant

## 2017-01-28 ENCOUNTER — Other Ambulatory Visit: Payer: Self-pay

## 2017-01-28 ENCOUNTER — Ambulatory Visit (INDEPENDENT_AMBULATORY_CARE_PROVIDER_SITE_OTHER): Payer: Medicare HMO

## 2017-01-28 ENCOUNTER — Ambulatory Visit (INDEPENDENT_AMBULATORY_CARE_PROVIDER_SITE_OTHER): Payer: Medicare HMO | Admitting: Physician Assistant

## 2017-01-28 VITALS — BP 110/62 | HR 74 | Temp 98.7°F | Resp 18 | Ht 62.5 in | Wt 170.4 lb

## 2017-01-28 DIAGNOSIS — R140 Abdominal distension (gaseous): Secondary | ICD-10-CM | POA: Diagnosis not present

## 2017-01-28 DIAGNOSIS — E785 Hyperlipidemia, unspecified: Secondary | ICD-10-CM | POA: Diagnosis not present

## 2017-01-28 DIAGNOSIS — G894 Chronic pain syndrome: Secondary | ICD-10-CM | POA: Diagnosis not present

## 2017-01-28 DIAGNOSIS — F329 Major depressive disorder, single episode, unspecified: Secondary | ICD-10-CM

## 2017-01-28 DIAGNOSIS — R7303 Prediabetes: Secondary | ICD-10-CM | POA: Diagnosis not present

## 2017-01-28 DIAGNOSIS — R1084 Generalized abdominal pain: Secondary | ICD-10-CM

## 2017-01-28 DIAGNOSIS — K5900 Constipation, unspecified: Secondary | ICD-10-CM

## 2017-01-28 DIAGNOSIS — F32A Depression, unspecified: Secondary | ICD-10-CM

## 2017-01-28 DIAGNOSIS — R109 Unspecified abdominal pain: Secondary | ICD-10-CM | POA: Diagnosis not present

## 2017-01-28 NOTE — Progress Notes (Signed)
Patient ID: Anita Arroyo, female    DOB: [birth year], 57 y.o.   MRN: [id number]  PCP: Porfirio Oar, PA-C  Chief Complaint  Patient presents with  . Follow-up    sugar check and med check   . Abdominal Pain    pt states it is very gassy, hurting, and bloated    . Diarrhea    on and off   . Depression    screening 21    Subjective:   Presents for evaluation of prediabetes and chronic pain.  Thinks that she is experiencing side effects from the rosuvastatin: depressed mood, stomach issues (gassy, pain, bloating and intermittent diarrhea). Is taking 1/2 tablet every 2-3 days, but with persistent symptoms. "I just can't tolerate statins." Also thinks that there is something else going on GI-wise, but that the rosuvastatin is making it worse. Recalls having similar symptoms on statin therapy before, that resolved, or at least improved with discontinuation. Not having a BM every day. When she does, it requires straining. The bloating sensation improved, but doesn't fully resolve after defecation. Intermittent diarrhea for the past 7-10 days. Has soft/loose, semi-formed stools in between. Has always had a lot of bowel "noise," avoids church services her whole life for that reason. But the past week it's been much worse and smells worse than usual.  We discussed treatment of chronic constipation at her last visit. Has purchased OTC Miralax, but hasn't used it. Uses OTC magnesium. Allergies are worse right now, too. Known allergy to cats, has cats that sleep with her.  Resumes PT next week for the LEFT shoulder. Having considerable pain with movements. Her orthopedist has retired. She will be following with another physician in the same practice. She is considering getting a second opinion with Dr. Jerl Santos.   Review of Systems As above.  Depression screen [facility] 2/9 01/28/2017 11/30/2016 10/12/2016 06/08/2016 03/29/2016  Decreased Interest 3 0 0 0 0  Down, Depressed, Hopeless 3 0 0  0 0  PHQ - 2 Score 6 0 0 0 0  Altered sleeping 3 - - - -  Tired, decreased energy 3 - - - -  Change in appetite 3 - - - -  Feeling bad or failure about yourself  1 - - - -  Trouble concentrating 2 - - - -  Moving slowly or fidgety/restless 3 - - - -  Suicidal thoughts 0 - - - -  PHQ-9 Score 21 - - - -  Some recent data might be hidden     Patient Active Problem List   Diagnosis Date Noted  . Prediabetes 10/21/2016  . Headache 10/12/2016  . 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 Chronic pain syndrome 05/27/2016  . Osteoarthritis 05/16/2016  . Seasonal allergies 05/03/2016  . History of palpitations 09/16/2015  . Bipolar 1 disorder (HCC) 09/16/2015  . History of breast cancer 11/16/2012  . Fibromyalgia 11/16/2012  . Smoker 11/16/2012  . INTERTRIGO 04/13/2010  . Hyperlipidemia 09/02/2006  . DEPRESSION 09/02/2006  . Essential hypertension 09/02/2006  . COPD (chronic obstructive pulmonary disease) (HCC) 09/02/2006  . GERD 09/02/2006     Prior to Admission medications   Medication Sig Start Date End Date Taking? Authorizing Provider  albuterol (PROVENTIL HFA;VENTOLIN HFA) 108 (90 Base) MCG/ACT inhaler Inhale 2 puffs every 4 (four) hours as needed into the lungs for wheezing or shortness of breath (cough, shortness of breath or wheezing.). 11/30/16  Yes Kestrel Mis, PA-C  atenolol (TENORMIN) 50 MG tablet Take 50  mg in the morning and 25 mg in the evening 11/10/16  Yes Lennette Bihari, MD  Biotin (BIOTIN 5000) 5 MG CAPS Take 5 mg by mouth daily.   Yes [provider]  blood glucose meter kit and supplies KIT Dispense based on patient and insurance preference. Use daily as directed. (FOR ICD-9 250.00, 250.01). 11/30/16  Yes Doyle Tegethoff, PA-C  cetirizine (ZYRTEC) 10 MG tablet Take 10 mg by mouth daily.    Yes [provider]  diclofenac sodium (VOLTAREN) 1 % GEL  01/26/17  Yes [provider]  fluticasone (FLONASE) 50 MCG/ACT nasal spray USE 1  SPRAY IN EACH NOSTRIL TWICE DAILY 11/16/16  Yes Anzley Dibbern, PA-C  Fluticasone-Salmeterol (ADVAIR) 250-50 MCG/DOSE AEPB Inhale 1 puff 2 (two) times daily into the lungs. 11/30/16  Yes July Linam, PA-C  HYDROcodone-acetaminophen (NORCO/VICODIN) 5-325 MG tablet take 1 TABLET BY MOUTH EVERY 6 HOURS AS NEEDED for moderate pain 01/10/17  Yes Soraiya Ahner, PA-C  lamoTRIgine (LAMICTAL) 150 MG tablet Take 1 tablet (150 mg total) 2 (two) times daily by mouth. 12/09/16  Yes Arfeen, Phillips Grout, MD  Magnesium 250 MG TABS Take 250 mg by mouth daily.   Yes [provider]  meclizine (ANTIVERT) 25 MG tablet Take 1 tablet (25 mg total) by mouth as needed for dizziness. 11/10/16  Yes Lennette Bihari, MD  montelukast (SINGULAIR) 10 MG tablet Take 1 tablet (10 mg total) at bedtime by mouth. 11/30/16  Yes Hartlee Amedee, PA-C  pantoprazole (PROTONIX) 40 MG tablet Take 1 tablet (40 mg total) by mouth daily. 07/27/16  Yes Arriana Lohmann, PA-C  traZODone (DESYREL) 100 MG tablet Take 2 tablets (200 mg total) at bedtime by mouth. 12/09/16  Yes Arfeen, Phillips Grout, MD  triamterene-hydrochlorothiazide (MAXZIDE-25) 37.5-25 MG tablet Take 0.5 tablets daily by mouth. 11/30/16  Yes Tanaja Ganger, PA-C  venlafaxine XR (EFFEXOR-XR) 150 MG 24 hr capsule Take 1 capsule (150 mg total) at bedtime by mouth. 12/09/16  Yes Arfeen, Phillips Grout, MD  rosuvastatin (CRESTOR) 20 MG tablet Take 1 tablet (20 mg total) by mouth daily. 08/19/16 11/17/16  Lennette Bihari, MD     Allergies  Allergen Reactions  . Contrast Media [Iodinated Diagnostic Agents] Shortness Of Breath  . Iodine Shortness Of Breath  . Adhesive [Tape] Other (See Comments)    Reaction:  Blisters   . Celecoxib     "I climb the walls"  . Statins     Body aches, constipation  . Ceclor [Cefaclor] Rash  . Moxifloxacin Rash  . Sulfonamide Derivatives Nausea And Vomiting       Objective:  Physical Exam  Constitutional: She is oriented to person, place, and time.  She appears well-developed and well-nourished. She is active and cooperative. No distress.  BP 110/62   Pulse 74   Temp 98.7 F (37.1 C) (Oral)   Resp 18   Ht 5' 2.5" (1.588 m)   Wt 170 lb 6.4 oz (77.3 kg)   LMP 06/25/2011   SpO2 98%   BMI 30.67 kg/m   HENT:  Head: Normocephalic and atraumatic.  Right Ear: Hearing normal.  Left Ear: Hearing normal.  Eyes: Conjunctivae are normal. No scleral icterus.  Neck: Normal range of motion. Neck supple. No thyromegaly present.  Cardiovascular: Normal rate, regular rhythm and normal heart sounds.  Pulses:      Radial pulses are 2+ on the right side, and 2+ on the left side.  Pulmonary/Chest: Effort normal and breath sounds normal.  Abdominal:  Soft. Normal appearance. She exhibits no distension and no mass. Bowel sounds are increased. There is no hepatosplenomegaly. There is generalized tenderness. There is no CVA tenderness.  Lymphadenopathy:       Head (right side): No tonsillar, no preauricular, no posterior auricular and no occipital adenopathy present.       Head (left side): No tonsillar, no preauricular, no posterior auricular and no occipital adenopathy present.    She has no cervical adenopathy.       Right: No supraclavicular adenopathy present.       Left: No supraclavicular adenopathy present.  Neurological: She is alert and oriented to person, place, and time. No sensory deficit.  Skin: Skin is warm, dry and intact. No rash noted. No cyanosis or erythema. Nails show no clubbing.  Psychiatric: She has a normal mood and affect. Her speech is normal and behavior is normal.       Wt Readings from Last 3 Encounters:  01/28/17 170 lb 6.4 oz (77.3 kg)  11/30/16 161 lb 9.6 oz (73.3 kg)  11/10/16 162 lb 3.2 oz (73.6 kg)       Assessment & Plan:   Problem List Items Addressed This Visit    Hyperlipidemia    Stop rosuvastatin due to adverse effects. Follow-up with cardiologist regarding next steps.      Relevant Orders    Comprehensive metabolic panel   Depression    Stop the statin, as she believes that it's contributing to dysphoric mood. Follow-up with her cardiologist and psychiatrist regarding next steps.      Chronic pain syndrome    Doesn't need a refill presently. Will contact me when she does.      Prediabetes - Primary    Not checking her home glucose due to feeling badly. Await labs. Adjust regimen as indicated by results.      Relevant Orders   Comprehensive metabolic panel   Hemoglobin A1c    Other Visit Diagnoses    Constipation, unspecified constipation type       Await Abdominal films. Plan use of OTC Miralax. If pain persists with D/C of statin and treatment, plan re-evalaution with GI.   Abdominal bloating       Likely due to constipation. Await Abdominal films. Plan use of OTC Miralax. If pain persists with D/C of statin and treatment, plan re-evalaution with GI.   Generalized abdominal pain       Likely due to constipation. Await Abdominal films. Plan use of OTC Miralax. If pain persists with D/C of statin and treatment, plan re-evalaution with GI.   Relevant Orders   DG Abd Acute W/Chest (Completed)       Return for re-evalaution pending labs and radiographs, or 3 months.   Fernande Bras, PA-C Primary Care at [facility]

## 2017-01-28 NOTE — Assessment & Plan Note (Signed)
Stop the statin, as she believes that it's contributing to dysphoric mood. Follow-up with her cardiologist and psychiatrist regarding next steps.

## 2017-01-28 NOTE — Assessment & Plan Note (Signed)
Doesn't need a refill presently. Will contact me when she does.

## 2017-01-28 NOTE — Assessment & Plan Note (Addendum)
Not checking her home glucose due to feeling badly. Await labs. Adjust regimen as indicated by results.

## 2017-01-28 NOTE — Assessment & Plan Note (Signed)
Stop rosuvastatin due to adverse effects. Follow-up with cardiologist regarding next steps.

## 2017-01-28 NOTE — Patient Instructions (Signed)
STOP the Crestor. Contact Dr. Claiborne Billings and let him know, and ask for an alternative. If your depressive symptoms do not resolve, contact your psychiatrist. Let's see what GI symptoms persist in 1-2 weeks. We may need to bring the GI specialists in to help.      IF you received an x-ray today, you will receive an invoice from [facility]. Please contact [facility] at [phone] with questions or concerns regarding your invoice.   IF you received labwork today, you will receive an invoice from [place]. Please contact [facility] at [phone] with questions or concerns regarding your invoice.   Our billing staff will not be able to assist you with questions regarding bills from these companies.  You will be contacted with the lab results as soon as they are available. The fastest way to get your results is to activate your My Chart account. Instructions are located on the last page of this paperwork. If you have not heard from [place] regarding the results in 2 weeks, please contact this office.

## 2017-01-29 LAB — COMPREHENSIVE METABOLIC PANEL
A/G RATIO: 2.2 (ref 1.2–2.2)
ALK PHOS: 83 IU/L (ref 39–117)
ALT: 21 IU/L (ref 0–32)
AST: 22 IU/L (ref 0–40)
Albumin: 4.6 g/dL (ref 3.5–5.5)
BILIRUBIN TOTAL: 0.3 mg/dL (ref 0.0–1.2)
BUN/Creatinine Ratio: 17 (ref 9–23)
BUN: 16 mg/dL (ref 6–24)
CHLORIDE: 100 mmol/L (ref 96–106)
CO2: 26 mmol/L (ref 20–29)
Calcium: 9.9 mg/dL (ref 8.7–10.2)
Creatinine, Ser: 0.92 mg/dL (ref 0.57–1.00)
GFR calc Af Amer: 80 mL/min/{1.73_m2} (ref >59)
GFR calc non Af Amer: 69 mL/min/{1.73_m2} (ref >59)
Globulin, Total: 2.1 g/dL (ref 1.5–4.5)
Glucose: 90 mg/dL (ref 65–99)
POTASSIUM: 4.3 mmol/L (ref 3.5–5.2)
Sodium: 141 mmol/L (ref 134–144)
Total Protein: 6.7 g/dL (ref 6.0–8.5)

## 2017-01-29 LAB — HEMOGLOBIN A1C
Est. average glucose Bld gHb Est-mCnc: 114 mg/dL
HEMOGLOBIN A1C: 5.6 % (ref 4.8–5.6)

## 2017-01-31 ENCOUNTER — Ambulatory Visit: Payer: Medicare HMO

## 2017-02-07 ENCOUNTER — Other Ambulatory Visit: Payer: Self-pay

## 2017-02-07 ENCOUNTER — Ambulatory Visit: Payer: Medicare HMO | Attending: Orthopedic Surgery

## 2017-02-07 DIAGNOSIS — M25512 Pain in left shoulder: Secondary | ICD-10-CM | POA: Diagnosis not present

## 2017-02-07 DIAGNOSIS — G8929 Other chronic pain: Secondary | ICD-10-CM | POA: Diagnosis not present

## 2017-02-07 DIAGNOSIS — M25612 Stiffness of left shoulder, not elsewhere classified: Secondary | ICD-10-CM | POA: Diagnosis not present

## 2017-02-07 DIAGNOSIS — M6281 Muscle weakness (generalized): Secondary | ICD-10-CM | POA: Insufficient documentation

## 2017-02-07 DIAGNOSIS — R293 Abnormal posture: Secondary | ICD-10-CM | POA: Diagnosis not present

## 2017-02-07 NOTE — Patient Instructions (Signed)
Hold all stretches 5-10 seconds and perform 5-10 times, 3 times a day.  to the side.  Slide right arm up wall, with  PALM FACING THE WALL, by leaning toward wall.  Copyright  VHI. All rights reserved.   Sitting upright, slide forearm forward along table, bending from the waist until a stretch is felt. Copyright  VHI. All rights reserved.    Clasp hands together and raise arms above head, keeping elbows as straight as possible. Can be done sitting or lying.   Copyright  VHI. All rights reserved.  Flexibility: Corner Stretch    Standing in corner with hands just above shoulder level and feet ____ inches from corner, lean forward until a comfortable stretch is felt across chest. Hold __10__ seconds. Repeat __5__ times per set. Do ___1_ sets per session. Do _3___ sessions per day.  http://orth.exer.us/343   Copyright  VHI. All rights reserved.

## 2017-02-07 NOTE — Therapy (Signed)
[facility] [address] [place], [place], [address] Phone: [phone]   Fax:  [phone]  Physical Therapy Evaluation  Patient Details  Name: Anita Arroyo MRN: [id number] Date of Birth: 07-28-1960 Referring Provider: Edmonia Lynch, MD   Encounter Date: 02/07/2017  PT End of Session - 02/07/17 1525    Visit Number  1    Date for PT Re-Evaluation  04/04/17    Authorization Type  Medicare    PT Start Time  1447    PT Stop Time  1526    PT Time Calculation (min)  39 min    Activity Tolerance  Patient tolerated treatment well    Behavior During Therapy  [facility] for tasks assessed/performed       Past Medical History:  Diagnosis Date  . Arthritis   . Asthma   . Bipolar 1 disorder ([place])   . Bipolar 1 disorder ([place])   . Breast cancer ([place])   . Depression   . Dysrhythmia    palpitations  . Dysrhythmia    atrial fibrillation  . Fibromyalgia   . Finger fracture 2011  . GERD (gastroesophageal reflux disease)   . Heart murmur   . History of stress test 04/28/2011   showed normal perfusion without scar or ischemia  . Hx of echocardiogram 04/28/2011   showed normal systolic function with mild diastolic dysfunction,she had trace MR but did not have frank mitral valve prolapse demonstrated. She had mild pulmonary hypertension with an estimated RV systolic pressure at 34 mm.  . Hyperlipemia   . Meniere disease   . MVP (mitral valve prolapse)   . Neuromuscular disorder ([place])    fibromyalgia    Past Surgical History:  Procedure Laterality Date  . ANKLE SURGERY     left x4  . BREAST SURGERY  2011   lumpectomy right breast   . COLONOSCOPY WITH PROPOFOL N/A 02/25/2014   Procedure: COLONOSCOPY WITH PROPOFOL;  Surgeon: Garlan Fair, MD;  Location: [facility];  Service: Endoscopy;  Laterality: N/A;  . deviated septum    . ECTOPIC PREGNANCY SURGERY    . ELBOW SURGERY     right elbow  . EYE SURGERY     cataract  surgery  . fibroid     2-3 fibroid adenomas removed  . JOINT REPLACEMENT  2011   right knee   . KNEE SURGERY     left knee  . SHOULDER ARTHROSCOPY WITH BICEPSTENOTOMY Right 09/25/2015   Procedure: SHOULDER ARTHROSCOPY WITH BICEPSTENOTOMY;  Surgeon: Ninetta Lights, MD;  Location: [place];  Service: Orthopedics;  Laterality: Right;  . SHOULDER ARTHROSCOPY WITH DISTAL CLAVICLE RESECTION Right 09/25/2015   Procedure: SHOULDER ARTHROSCOPY WITH DISTAL CLAVICLE RESECTION;  Surgeon: Ninetta Lights, MD;  Location: [place];  Service: Orthopedics;  Laterality: Right;  . SHOULDER ARTHROSCOPY WITH SUBACROMIAL DECOMPRESSION Right 09/25/2015   Procedure: RIGHT SHOULDER ARTHROSCOPY DEBRIDEMENT,ACROMIOPLASTY,DISTAL CLAVICAL EXCISION, RELEASE OF BICEPS TENDON;  Surgeon: Ninetta Lights, MD;  Location: [place];  Service: Orthopedics;  Laterality: Right;  . TMJ ARTHROPLASTY    . TONSILLECTOMY    . TOTAL SHOULDER ARTHROPLASTY Left 01/05/2016  . UTERINE FIBROID SURGERY     polups and fibroids removed   . WRIST SURGERY     left    There were no vitals filed for this visit.   Subjective Assessment - 02/07/17 1455    Subjective  Pt presents to PT with chronic Lt shoulder  pain and weakness.  Pt had Lt total shoulder replacement 12/2015 and reports that she has had problems with it since.  Pt had PT at this clinic 12/2015-03/2016    Pertinent History  Rt breast cancer, Lt shoulder replacement 12/2015, Rt TKA .  Pt is hard of hearing and cries during treatment at times    Diagnostic tests  MRI of Lt shoulder: joint replacement is in tact    Patient Stated Goals  improve strength of Lt shoulder, reduce Lt shoulder pain, use Lt arm without limitation    Currently in Pain?  Yes    Pain Score  1  1-10/10    Pain Location  Shoulder    Pain Orientation  Left    Pain Descriptors / Indicators  Stabbing;Sharp;Aching    Pain Type  Chronic pain    Pain Onset  More  than a month ago    Pain Frequency  Constant    Aggravating Factors   reaching back, reaching out to the side, movement of Lt UE against gravity, IR    Pain Relieving Factors  sitting at rest, neutral Lt shoulder         [facility] PT Assessment - 02/07/17 0001      Assessment   Medical Diagnosis  shoulder weakness    Referring Provider  Edmonia Lynch, MD    Onset Date/Surgical Date  01/08/16 Lt total shoulder replacement    Hand Dominance  Right    Next MD Visit  02/2017    Prior Therapy  at this clinic until 03/2016      Precautions   Precautions  Other (comment)    Precaution Comments  hard of hearing, breast cancer      Balance Screen   Has the patient fallen in the past 6 months  Yes    How many times?  1 tripped getting groceries out of the car    Has the patient had a decrease in activity level because of a fear of falling?   No    Is the patient reluctant to leave their home because of a fear of falling?   No      Home Film/video editor residence    Living Arrangements  Alone      Prior Function   Level of Independence  Independent    Vocation  Part time employment    Vocation Requirements  dog sitting/ walking, house sitting      Cognition   Overall Cognitive Status  Within Functional Limits for tasks assessed      Observation/Other Assessments   Focus on Therapeutic Outcomes (FOTO)   44% limitation      Posture/Postural Control   Posture/Postural Control  Postural limitations    Postural Limitations  Rounded Shoulders;Forward head      ROM / Strength   AROM / PROM / Strength  PROM;AROM;Strength      AROM   Overall AROM   Deficits    Overall AROM Comments  Rt shoulder A/ROM is full with Rt anterior glenohumeral joint reported at end range of all motions    AROM Assessment Site  Shoulder    Right/Left Shoulder  Left pain at end range on Lt    Left Shoulder Flexion  150 Degrees    Left Shoulder ABduction  100 Degrees    Left Shoulder  Internal Rotation  -- to Lt buttock    Left Shoulder External Rotation  -- Rt=Lt  PROM   Overall PROM   Deficits    Overall PROM Comments  Rt shoulder P/ROM is full with pain at end range    PROM Assessment Site  Shoulder    Right/Left Shoulder  Left    Left Shoulder Flexion  140 Degrees pain over anterior glenohumeral joint    Left Shoulder ABduction  115 Degrees    Left Shoulder Internal Rotation  65 Degrees    Left Shoulder External Rotation  50 Degrees      Strength   Overall Strength  Unable to assess    Overall Strength Comments  Not able to perform MMT on Lt due to pain.   Rt shoulder is 4+/5 with pectoralis pain reported with resisted testing      Palpation   Palpation comment  with with reduced inferior glide of Lt glenhoumeral joint with mobs.  Pt with hypersensitivity to light palpation over Lt anterior deltoid and pectoralis      Ambulation/Gait   Gait Pattern  Within Functional Limits             Objective measurements completed on examination: See above findings.              PT Education - 02/07/17 1523    Education provided  Yes    Education Details  shoulder A/AROM and pec stretch    Person(s) Educated  Patient    Methods  Explanation;Demonstration;Handout    Comprehension  Verbalized understanding;Returned demonstration       PT Short Term Goals - 02/07/17 1459      PT SHORT TERM GOAL #1   Title  be independent in initial HEP    Time  4    Period  Weeks    Status  New    Target Date  03/07/17      PT SHORT TERM GOAL #2   Title  demonstrate Lt shoulder A/ROM abduction with < or = to 6/10 pain to allow for reaching out to the side    Time  4    Period  Weeks    Status  New    Target Date  03/07/17      PT SHORT TERM GOAL #3   Title  demonstrate Lt shoulder A/ROM IR to L5 to improve self-care without substitution    Time  4    Period  Weeks    Status  New    Target Date  03/07/17      PT SHORT TERM GOAL #4   Title  report  25% less Lt shoulder pain with putting on her jacket    Time  4    Period  Weeks    Status  New    Target Date  03/07/17      PT SHORT TERM GOAL #5   Title  __        PT Long Term Goals - 02/07/17 1529      PT LONG TERM GOAL #1   Title  be independent in advanced HEP    Time  8    Period  Weeks    Status  New    Target Date  04/04/17      PT LONG TERM GOAL #2   Title  reduce FOTO to < or = to 38% limitation    Time  8    Period  Weeks    Status  New    Target Date  04/04/17      PT LONG TERM  GOAL #3   Title  demonstarte Lt shoulder A/ROM IR to L3 to perform self-care without limitation    Time  8    Period  Weeks    Status  New    Target Date  04/04/17      PT LONG TERM GOAL #4   Title  reach out to the side with Lt UE with < or = to 4/10 Lt shoulder pain reported    Time  8    Period  Days    Status  New    Target Date  04/04/17      PT LONG TERM GOAL #5   Title  put on jacket with 50% less Lt shoulder pain    Time  8    Period  Weeks    Status  New    Target Date  04/04/17             Plan - 02/07/17 1554    Clinical Impression Statement  Pt presents to PT with complaints of chronic Lt shoulder pain and weakness.  Pt had Lt total shoulder replacement 12/2015 and had PT at this clinic until 03/2016.  Pt with limited A/ROM and P/ROM with limited joint mobility at the Lt glenohumeral joint.  Pt with hypersensitivity to gentle touch over Lt pectoralis and anterior deltoid on the Lt.  Pt with forward head and rounded shoulder posture.  Pt with up to 10/10 pain reported with reaching out to the side, behind the back and putting on a jacket.  Pt will benefit from skilled PT for Lt shoulder flexibility, dry needing, shoulder strength and manual therapy/modalities as needed.      History and Personal Factors relevant to plan of care:  depression, bipolar, Lt total shoulder replacement    Clinical Presentation  Evolving    Clinical Presentation due to:  worsening  since surgery 1 year ago    Clinical Decision Making  Moderate    Rehab Potential  Good    PT Frequency  2x / week    PT Duration  8 weeks    PT Treatment/Interventions  ADLs/Self Care Home Management;Cryotherapy;Electrical Stimulation;Iontophoresis 4mg /ml Dexamethasone;Therapeutic activities;Neuromuscular re-education;Patient/family education;Passive range of motion;Manual techniques;Dry needling;Taping;Vasopneumatic Device    PT Next Visit Plan  dry needling to Lt anterior shoulder if treated by PT, gentle Lt shoulder flexibility, strength, manual to anterior shoulder, kinesiotape    Consulted and Agree with Plan of Care  Patient       Patient will benefit from skilled therapeutic intervention in order to improve the following deficits and impairments:  Pain, Impaired flexibility, Decreased activity tolerance, Decreased range of motion, Decreased strength, Impaired UE functional use, Postural dysfunction, Increased muscle spasms  Visit Diagnosis: Muscle weakness (generalized) - Plan: PT plan of care cert/re-cert  Stiffness of left shoulder, not elsewhere classified - Plan: PT plan of care cert/re-cert  Chronic left shoulder pain - Plan: PT plan of care cert/re-cert  Abnormal posture - Plan: PT plan of care cert/re-cert     Problem List Patient Active Problem List   Diagnosis Date Noted  . Prediabetes 10/21/2016  . Headache 10/12/2016  . 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 Chronic pain syndrome 05/27/2016  . Osteoarthritis 05/16/2016  . Seasonal allergies 05/03/2016  . History of palpitations 09/16/2015  . Bipolar 1 disorder ([place]) 09/16/2015  . History of breast cancer 11/16/2012  . Fibromyalgia 11/16/2012  . Smoker 11/16/2012  . INTERTRIGO 04/13/2010  . Hyperlipidemia 09/02/2006  . Depression 09/02/2006  .  Essential hypertension 09/02/2006  . COPD (chronic obstructive pulmonary disease) ([place]) 09/02/2006  . GERD 09/02/2006     Sigurd Sos, PT 02/07/17 4:24  PM  [facility] [address] [place], [place], [address] Phone: [phone]   Fax:  [phone]  Name: MACEE VENABLES MRN: [id number] Date of Birth: 1960/03/08

## 2017-02-09 ENCOUNTER — Ambulatory Visit: Payer: Medicare HMO | Admitting: Physical Therapy

## 2017-02-09 ENCOUNTER — Encounter: Payer: Self-pay | Admitting: Physical Therapy

## 2017-02-09 DIAGNOSIS — M25612 Stiffness of left shoulder, not elsewhere classified: Secondary | ICD-10-CM

## 2017-02-09 DIAGNOSIS — M6281 Muscle weakness (generalized): Secondary | ICD-10-CM

## 2017-02-09 DIAGNOSIS — M25512 Pain in left shoulder: Secondary | ICD-10-CM | POA: Diagnosis not present

## 2017-02-09 DIAGNOSIS — R293 Abnormal posture: Secondary | ICD-10-CM | POA: Diagnosis not present

## 2017-02-09 DIAGNOSIS — G8929 Other chronic pain: Secondary | ICD-10-CM

## 2017-02-09 NOTE — Patient Instructions (Signed)
Strengthening: Resisted Internal Rotation   Hold tubing in left hand, elbow at side and forearm out. Rotate forearm in across body. Repeat __10__ times per set. Do _2___ sets per session. Do ___1-2_ sessions per day.  http://orth.exer.us/830   Copyright  VHI. All rights reserved.  Strengthening: Resisted External Rotation   Hold tubing in right hand, elbow at side and forearm across body. Rotate forearm out. Repeat __10__ times per set. Do __2__ sets per session. Do _1-2___ sessions per day.  http://orth.exer.us/828   Copyright  VHI. All rights reserved.  Strengthening: Resisted Flexion   Hold tubing with left arm at side.We did this with your elbow bent. I think this may be an easier way for you to start this.  Punch your arm forward. Move shoulder through pain-free range of motion. Repeat __10__ times per set. Do _2___ sets per session. Do _1-2___ sessions per day.  http://orth.exer.us/824   Copyright  VHI. All rights reserved.  Strengthening: Resisted Extension   Hold tubing in left hand, arm forward. Pull arm back, bending elbow. The picture shows a straight elbow. I would like you to bend the elbow.  Repeat __10__ times per set. Do __2__ sets per session. Do __1-2__ sessions per day.  http://orth.exer.us/832   Copyright  VHI. All rights reserved.

## 2017-02-09 NOTE — Therapy (Signed)
[facility] [address] [place], [place], [address] Phone: [phone]   Fax:  [phone]  Physical Therapy Treatment  Patient Details  Name: Anita Arroyo MRN: [id number] Date of Birth: 01-06-1961 Referring Provider: Edmonia Lynch, MD   Encounter Date: 02/09/2017  PT End of Session - 02/09/17 1455    Visit Number  2    Date for PT Re-Evaluation  04/04/17    Authorization Type  Medicare    PT Start Time  1446    PT Stop Time  1545    PT Time Calculation (min)  59 min    Activity Tolerance  Patient tolerated treatment well    Behavior During Therapy  [facility] for tasks assessed/performed       Past Medical History:  Diagnosis Date  . Arthritis   . Asthma   . Bipolar 1 disorder ([place])   . Bipolar 1 disorder ([place])   . Breast cancer ([place])   . Depression   . Dysrhythmia    palpitations  . Dysrhythmia    atrial fibrillation  . Fibromyalgia   . Finger fracture 2011  . GERD (gastroesophageal reflux disease)   . Heart murmur   . History of stress test 04/28/2011   showed normal perfusion without scar or ischemia  . Hx of echocardiogram 04/28/2011   showed normal systolic function with mild diastolic dysfunction,she had trace MR but did not have frank mitral valve prolapse demonstrated. She had mild pulmonary hypertension with an estimated RV systolic pressure at 34 mm.  . Hyperlipemia   . Meniere disease   . MVP (mitral valve prolapse)   . Neuromuscular disorder ([place])    fibromyalgia    Past Surgical History:  Procedure Laterality Date  . ANKLE SURGERY     left x4  . BREAST SURGERY  2011   lumpectomy right breast   . COLONOSCOPY WITH PROPOFOL N/A 02/25/2014   Procedure: COLONOSCOPY WITH PROPOFOL;  Surgeon: Garlan Fair, MD;  [facility];  Service: Endoscopy;  Laterality: N/A;  . deviated septum    . ECTOPIC PREGNANCY SURGERY    . ELBOW SURGERY     right elbow  . EYE SURGERY     cataract  surgery  . fibroid     2-3 fibroid adenomas removed  . JOINT REPLACEMENT  2011   right knee   . KNEE SURGERY     left knee  . SHOULDER ARTHROSCOPY WITH BICEPSTENOTOMY Right 09/25/2015   Procedure: SHOULDER ARTHROSCOPY WITH BICEPSTENOTOMY;  Surgeon: Ninetta Lights, MD;  Location: [place];  Service: Orthopedics;  Laterality: Right;  . SHOULDER ARTHROSCOPY WITH DISTAL CLAVICLE RESECTION Right 09/25/2015   Procedure: SHOULDER ARTHROSCOPY WITH DISTAL CLAVICLE RESECTION;  Surgeon: Ninetta Lights, MD;  Location: [place];  Service: Orthopedics;  Laterality: Right;  . SHOULDER ARTHROSCOPY WITH SUBACROMIAL DECOMPRESSION Right 09/25/2015   Procedure: RIGHT SHOULDER ARTHROSCOPY DEBRIDEMENT,ACROMIOPLASTY,DISTAL CLAVICAL EXCISION, RELEASE OF BICEPS TENDON;  Surgeon: Ninetta Lights, MD;  Location: [place];  Service: Orthopedics;  Laterality: Right;  . TMJ ARTHROPLASTY    . TONSILLECTOMY    . TOTAL SHOULDER ARTHROPLASTY Left 01/05/2016  . UTERINE FIBROID SURGERY     polups and fibroids removed   . WRIST SURGERY     left    There were no vitals filed for this visit.  Subjective Assessment - 02/09/17 1500    Subjective  I am just having a off day today. Anterior  [facility] [address] [place], [place], [address] Phone: [phone]   Fax:  [phone]  Physical Therapy Treatment  Patient Details  Name: Anita Arroyo MRN: [id number] Date of Birth: 01-06-1961 Referring Provider: Edmonia Lynch, MD   Encounter Date: 02/09/2017  PT End of Session - 02/09/17 1455    Visit Number  2    Date for PT Re-Evaluation  04/04/17    Authorization Type  Medicare    PT Start Time  1446    PT Stop Time  1545    PT Time Calculation (min)  59 min    Activity Tolerance  Patient tolerated treatment well    Behavior During Therapy  [facility] for tasks assessed/performed       Past Medical History:  Diagnosis Date  . Arthritis   . Asthma   . Bipolar 1 disorder ([place])   . Bipolar 1 disorder ([place])   . Breast cancer ([place])   . Depression   . Dysrhythmia    palpitations  . Dysrhythmia    atrial fibrillation  . Fibromyalgia   . Finger fracture 2011  . GERD (gastroesophageal reflux disease)   . Heart murmur   . History of stress test 04/28/2011   showed normal perfusion without scar or ischemia  . Hx of echocardiogram 04/28/2011   showed normal systolic function with mild diastolic dysfunction,she had trace MR but did not have frank mitral valve prolapse demonstrated. She had mild pulmonary hypertension with an estimated RV systolic pressure at 34 mm.  . Hyperlipemia   . Meniere disease   . MVP (mitral valve prolapse)   . Neuromuscular disorder ([place])    fibromyalgia    Past Surgical History:  Procedure Laterality Date  . ANKLE SURGERY     left x4  . BREAST SURGERY  2011   lumpectomy right breast   . COLONOSCOPY WITH PROPOFOL N/A 02/25/2014   Procedure: COLONOSCOPY WITH PROPOFOL;  Surgeon: Garlan Fair, MD;  [facility];  Service: Endoscopy;  Laterality: N/A;  . deviated septum    . ECTOPIC PREGNANCY SURGERY    . ELBOW SURGERY     right elbow  . EYE SURGERY     cataract  surgery  . fibroid     2-3 fibroid adenomas removed  . JOINT REPLACEMENT  2011   right knee   . KNEE SURGERY     left knee  . SHOULDER ARTHROSCOPY WITH BICEPSTENOTOMY Right 09/25/2015   Procedure: SHOULDER ARTHROSCOPY WITH BICEPSTENOTOMY;  Surgeon: Ninetta Lights, MD;  Location: [place];  Service: Orthopedics;  Laterality: Right;  . SHOULDER ARTHROSCOPY WITH DISTAL CLAVICLE RESECTION Right 09/25/2015   Procedure: SHOULDER ARTHROSCOPY WITH DISTAL CLAVICLE RESECTION;  Surgeon: Ninetta Lights, MD;  Location: [place];  Service: Orthopedics;  Laterality: Right;  . SHOULDER ARTHROSCOPY WITH SUBACROMIAL DECOMPRESSION Right 09/25/2015   Procedure: RIGHT SHOULDER ARTHROSCOPY DEBRIDEMENT,ACROMIOPLASTY,DISTAL CLAVICAL EXCISION, RELEASE OF BICEPS TENDON;  Surgeon: Ninetta Lights, MD;  Location: [place];  Service: Orthopedics;  Laterality: Right;  . TMJ ARTHROPLASTY    . TONSILLECTOMY    . TOTAL SHOULDER ARTHROPLASTY Left 01/05/2016  . UTERINE FIBROID SURGERY     polups and fibroids removed   . WRIST SURGERY     left    There were no vitals filed for this visit.  Subjective Assessment - 02/09/17 1500    Subjective  I am just having a off day today. Anterior  Weeks    Status  New    Target Date  04/04/17      PT LONG TERM GOAL #3   Title  demonstarte Lt shoulder A/ROM IR to L3 to perform self-care without limitation    Time  8    Period  Weeks    Status  New    Target Date  04/04/17      PT LONG TERM GOAL #4    Title  reach out to the side with Lt UE with < or = to 4/10 Lt shoulder pain reported    Time  8    Period  Days    Status  New    Target Date  04/04/17      PT LONG TERM GOAL #5   Title  put on jacket with 50% less Lt shoulder pain    Time  8    Period  Weeks    Status  New    Target Date  04/04/17            Plan - 02/09/17 1530    Clinical Impression Statement  Pt presents with minor pain complaints to the Lt anterior chest/shoulder at rest. Randomly a sharp pain will hit per her report. Pt was started on a gentle rotator cuff strengthening exercise program with red theraband. She was able to demonstrate this today without any pain. Soft tissues anteriorly to shoulder and chest were taut, this seemed to loosen with manual work today.     Rehab Potential  Good    PT Frequency  2x / week    PT Treatment/Interventions  ADLs/Self Care Home Management;Cryotherapy;Electrical Stimulation;Iontophoresis 4mg /ml Dexamethasone;Therapeutic activities;Neuromuscular re-education;Patient/family education;Passive range of motion;Manual techniques;Dry needling;Taping;Vasopneumatic Device    PT Next Visit Plan  DN if seen by PT, review red [place] and begin shoulder 3 way lifts with 1#.     Consulted and Agree with Plan of Care  Patient       Patient will benefit from skilled therapeutic intervention in order to improve the following deficits and impairments:  Pain, Impaired flexibility, Decreased activity tolerance, Decreased range of motion, Decreased strength, Impaired UE functional use, Postural dysfunction, Increased muscle spasms  Visit Diagnosis: Muscle weakness (generalized)  Stiffness of left shoulder, not elsewhere classified  Chronic left shoulder pain  Abnormal posture  Acute pain of left shoulder     Problem List Patient Active Problem List   Diagnosis Date Noted  . Prediabetes 10/21/2016  . Headache 10/12/2016  . 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 Chronic  pain syndrome 05/27/2016  . Osteoarthritis 05/16/2016  . Seasonal allergies 05/03/2016  . History of palpitations 09/16/2015  . Bipolar 1 disorder ([place]) 09/16/2015  . History of breast cancer 11/16/2012  . Fibromyalgia 11/16/2012  . Smoker 11/16/2012  . INTERTRIGO 04/13/2010  . Hyperlipidemia 09/02/2006  . Depression 09/02/2006  . Essential hypertension 09/02/2006  . COPD (chronic obstructive pulmonary disease) ([place]) 09/02/2006  . GERD 09/02/2006    Jiovanny Burdell, PTA 02/09/2017, 3:33 PM  [facility] [address], [place], [place], [address] Phone: [phone]   Fax:  [phone]  Name: Anita Arroyo MRN: [id number] Date of Birth: Mar 01, 1960

## 2017-02-11 ENCOUNTER — Telehealth: Payer: Self-pay | Admitting: Physician Assistant

## 2017-02-11 DIAGNOSIS — G894 Chronic pain syndrome: Secondary | ICD-10-CM

## 2017-02-11 NOTE — Telephone Encounter (Signed)
Copied from [place] [id number]. Topic: Quick Communication - See Telephone Encounter >> Feb 11, 2017  3:06 PM Ether Griffins B wrote: CRM for notification. See Telephone encounter for:  Pt needing refill hydrocodone  02/11/17.

## 2017-02-14 ENCOUNTER — Ambulatory Visit: Payer: Medicare HMO

## 2017-02-14 DIAGNOSIS — R293 Abnormal posture: Secondary | ICD-10-CM

## 2017-02-14 DIAGNOSIS — M25512 Pain in left shoulder: Secondary | ICD-10-CM | POA: Diagnosis not present

## 2017-02-14 DIAGNOSIS — G8929 Other chronic pain: Secondary | ICD-10-CM

## 2017-02-14 DIAGNOSIS — M25612 Stiffness of left shoulder, not elsewhere classified: Secondary | ICD-10-CM | POA: Diagnosis not present

## 2017-02-14 DIAGNOSIS — M6281 Muscle weakness (generalized): Secondary | ICD-10-CM

## 2017-02-14 NOTE — Therapy (Signed)
[facility] [address], [place], [place], [address] Phone: [phone]   Fax:  [phone]  Physical Therapy Treatment  Patient Details  Name: Anita Arroyo MRN: [id number] Date of Birth: December 06, 1960 Referring Provider: Edmonia Lynch, MD   Encounter Date: 02/14/2017  PT End of Session - 02/14/17 1443    Visit Number  3    Date for PT Re-Evaluation  04/04/17    Authorization Type  Medicare    PT Start Time  1403 dry needling    PT Stop Time  1455    PT Time Calculation (min)  52 min    Activity Tolerance  Patient tolerated treatment well       Past Medical History:  Diagnosis Date  . Arthritis   . Asthma   . Bipolar 1 disorder ([place])   . Bipolar 1 disorder ([place])   . Breast cancer ([place])   . Depression   . Dysrhythmia    palpitations  . Dysrhythmia    atrial fibrillation  . Fibromyalgia   . Finger fracture 2011  . GERD (gastroesophageal reflux disease)   . Heart murmur   . History of stress test 04/28/2011   showed normal perfusion without scar or ischemia  . Hx of echocardiogram 04/28/2011   showed normal systolic function with mild diastolic dysfunction,she had trace MR but did not have frank mitral valve prolapse demonstrated. She had mild pulmonary hypertension with an estimated RV systolic pressure at 34 mm.  . Hyperlipemia   . Meniere disease   . MVP (mitral valve prolapse)   . Neuromuscular disorder ([place])    fibromyalgia    Past Surgical History:  Procedure Laterality Date  . ANKLE SURGERY     left x4  . BREAST SURGERY  2011   lumpectomy right breast   . COLONOSCOPY WITH PROPOFOL N/A 02/25/2014   Procedure: COLONOSCOPY WITH PROPOFOL;  Surgeon: Garlan Fair, MD;  Location: [facility];  Service: Endoscopy;  Laterality: N/A;  . deviated septum    . ECTOPIC PREGNANCY SURGERY    . ELBOW SURGERY     right elbow  . EYE SURGERY     cataract surgery  . fibroid     2-3 fibroid adenomas  removed  . JOINT REPLACEMENT  2011   right knee   . KNEE SURGERY     left knee  . SHOULDER ARTHROSCOPY WITH BICEPSTENOTOMY Right 09/25/2015   Procedure: SHOULDER ARTHROSCOPY WITH BICEPSTENOTOMY;  Surgeon: Ninetta Lights, MD;  Location: [facility];  Service: Orthopedics;  Laterality: Right;  . SHOULDER ARTHROSCOPY WITH DISTAL CLAVICLE RESECTION Right 09/25/2015   Procedure: SHOULDER ARTHROSCOPY WITH DISTAL CLAVICLE RESECTION;  Surgeon: Ninetta Lights, MD;  Location: [facility];  Service: Orthopedics;  Laterality: Right;  . SHOULDER ARTHROSCOPY WITH SUBACROMIAL DECOMPRESSION Right 09/25/2015   Procedure: RIGHT SHOULDER ARTHROSCOPY DEBRIDEMENT,ACROMIOPLASTY,DISTAL CLAVICAL EXCISION, RELEASE OF BICEPS TENDON;  Surgeon: Ninetta Lights, MD;  Location: [facility];  Service: Orthopedics;  Laterality: Right;  . TMJ ARTHROPLASTY    . TONSILLECTOMY    . TOTAL SHOULDER ARTHROPLASTY Left 01/05/2016  . UTERINE FIBROID SURGERY     polups and fibroids removed   . WRIST SURGERY     left    There were no vitals filed for this visit.  Subjective Assessment - 02/14/17 1407    Subjective  The more exercise that I do with my Lt arm, the more it hurts and the weaker it  feels.      Currently in Pain?  Yes    Pain Score  4     Pain Location  Shoulder    Pain Orientation  Left    Pain Descriptors / Indicators  Stabbing;Sharp    Pain Onset  More than a month ago    Pain Frequency  Constant    Aggravating Factors   reaching back, reaching out to the side, moving Lt UE against gravity    Pain Relieving Factors  sitting at rest, neutral Lt shoulder                       [facility] Adult PT Treatment/Exercise - 02/14/17 0001      Shoulder Exercises: Pulleys   Flexion  3 minutes    ABduction  3 minutes      Shoulder Exercises: Stretch   Corner Stretch  3 reps;20 seconds      Moist Heat Therapy   Number Minutes Moist Heat  15 Minutes    Moist Heat  Location  Shoulder;Cervical      Manual Therapy   Manual Therapy  Soft tissue mobilization    Manual therapy comments  Manual soft tissue to anterior shoulder/pectorals and Lt upper traps       Trigger Point Dry Needling - 02/14/17 1408    Consent Given?  Yes    Education Handout Provided  Yes pneumothorax precautions    Muscles Treated Upper Body  Upper trapezius;Pectoralis major;Pectoralis minor Lt only    Upper Trapezius Response  Twitch reponse elicited;Palpable increased muscle length Lt only    Pectoralis Major Response  Twitch response elicited;Palpable increased muscle length Lt    Pectoralis Minor Response  Twitch response elicited;Palpable increased muscle length Lt           PT Education - 02/14/17 1410    Education provided  Yes    Education Details  DN info    Person(s) Educated  Patient    Methods  Explanation;Handout    Comprehension  Verbalized understanding;Returned demonstration       PT Short Term Goals - 02/07/17 1459      PT SHORT TERM GOAL #1   Title  be independent in initial HEP    Time  4    Period  Weeks    Status  New    Target Date  03/07/17      PT SHORT TERM GOAL #2   Title  demonstrate Lt shoulder A/ROM abduction with < or = to 6/10 pain to allow for reaching out to the side    Time  4    Period  Weeks    Status  New    Target Date  03/07/17      PT SHORT TERM GOAL #3   Title  demonstrate Lt shoulder A/ROM IR to L5 to improve self-care without substitution    Time  4    Period  Weeks    Status  New    Target Date  03/07/17      PT SHORT TERM GOAL #4   Title  report 25% less Lt shoulder pain with putting on her jacket    Time  4    Period  Weeks    Status  New    Target Date  03/07/17      PT SHORT TERM GOAL #5   Title  __        PT Long Term Goals - 02/07/17 1529  PT LONG TERM GOAL #1   Title  be independent in advanced HEP    Time  8    Period  Weeks    Status  New    Target Date  04/04/17      PT LONG  TERM GOAL #2   Title  reduce FOTO to < or = to 38% limitation    Time  8    Period  Weeks    Status  New    Target Date  04/04/17      PT LONG TERM GOAL #3   Title  demonstarte Lt shoulder A/ROM IR to L3 to perform self-care without limitation    Time  8    Period  Weeks    Status  New    Target Date  04/04/17      PT LONG TERM GOAL #4   Title  reach out to the side with Lt UE with < or = to 4/10 Lt shoulder pain reported    Time  8    Period  Days    Status  New    Target Date  04/04/17      PT LONG TERM GOAL #5   Title  put on jacket with 50% less Lt shoulder pain    Time  8    Period  Weeks    Status  New    Target Date  04/04/17            Plan - 02/14/17 1410    Clinical Impression Statement  Pt reports that exercise is aggravating her Lt UE symptoms.  Pt has shart pain in the Lt shoulder with reaching back and out to the side.  Pt with trigger points and tension in Lt pectorals and upper traps. Pt was less sensitive to palpation today vs the evlaution. Pt with reduced tension and improved tissue mobility after dry needling and manual therapy today.  Pt will continue to benefit from skilled PT for Lt UE strength, gentle flexibility and manual therapy as needed.      Rehab Potential  Good    PT Frequency  2x / week    PT Duration  8 weeks    PT Treatment/Interventions  ADLs/Self Care Home Management;Cryotherapy;Electrical Stimulation;Iontophoresis 4mg /ml Dexamethasone;Therapeutic activities;Neuromuscular re-education;Patient/family education;Passive range of motion;Manual techniques;Dry needling;Taping;Vasopneumatic Device    PT Next Visit Plan  assess response to dry needling, gentle progression of Lt shoulder flexibility and strength.    Consulted and Agree with Plan of Care  Patient       Patient will benefit from skilled therapeutic intervention in order to improve the following deficits and impairments:  Pain, Impaired flexibility, Decreased activity tolerance,  Decreased range of motion, Decreased strength, Impaired UE functional use, Postural dysfunction, Increased muscle spasms  Visit Diagnosis: Muscle weakness (generalized)  Stiffness of left shoulder, not elsewhere classified  Chronic left shoulder pain  Abnormal posture     Problem List Patient Active Problem List   Diagnosis Date Noted  . Prediabetes 10/21/2016  . Headache 10/12/2016  . 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 Chronic pain syndrome 05/27/2016  . Osteoarthritis 05/16/2016  . Seasonal allergies 05/03/2016  . History of palpitations 09/16/2015  . Bipolar 1 disorder ([place]) 09/16/2015  . History of breast cancer 11/16/2012  . Fibromyalgia 11/16/2012  . Smoker 11/16/2012  . INTERTRIGO 04/13/2010  . Hyperlipidemia 09/02/2006  . Depression 09/02/2006  . Essential hypertension 09/02/2006  . COPD (chronic obstructive pulmonary disease) ([place]) 09/02/2006  .  GERD 09/02/2006     Sigurd Sos, PT 02/14/17 2:44 PM  [facility] [address] [place], [place], [address] Phone: [phone]   Fax:  [phone]  Name: Anita Arroyo MRN: [id number] Date of Birth: 07-11-1960

## 2017-02-14 NOTE — Patient Instructions (Addendum)

## 2017-02-15 ENCOUNTER — Other Ambulatory Visit: Payer: Self-pay

## 2017-02-15 DIAGNOSIS — G894 Chronic pain syndrome: Secondary | ICD-10-CM

## 2017-02-15 MED ORDER — HYDROCODONE-ACETAMINOPHEN 5-325 MG PO TABS: ORAL_TABLET | ORAL | 0 refills | Status: DC

## 2017-02-15 NOTE — Telephone Encounter (Signed)
Please advise 

## 2017-02-15 NOTE — Telephone Encounter (Signed)
Sign Encounter

## 2017-02-15 NOTE — Telephone Encounter (Signed)
Patient is requesting her refill. Please advise

## 2017-02-15 NOTE — Telephone Encounter (Signed)
Pt notified. Rx is at [address].

## 2017-02-15 NOTE — Telephone Encounter (Signed)
Unable to send electronically (system error). Printed at [facility].  Meds ordered this encounter  Medications  . HYDROcodone-acetaminophen (NORCO/VICODIN) 5-325 MG tablet    Sig: take 1 TABLET BY MOUTH EVERY 6 HOURS AS NEEDED for moderate pain    Dispense:  120 tablet    Refill:  0    Order Specific Question:   Supervising Provider    Answer:   Brigitte Pulse, EVA N [[id number]]

## 2017-02-16 ENCOUNTER — Encounter: Payer: Self-pay | Admitting: Physical Therapy

## 2017-02-16 ENCOUNTER — Ambulatory Visit: Payer: Medicare HMO | Admitting: Physical Therapy

## 2017-02-16 DIAGNOSIS — M25512 Pain in left shoulder: Secondary | ICD-10-CM | POA: Diagnosis not present

## 2017-02-16 DIAGNOSIS — R293 Abnormal posture: Secondary | ICD-10-CM | POA: Diagnosis not present

## 2017-02-16 DIAGNOSIS — G8929 Other chronic pain: Secondary | ICD-10-CM

## 2017-02-16 DIAGNOSIS — M25612 Stiffness of left shoulder, not elsewhere classified: Secondary | ICD-10-CM | POA: Diagnosis not present

## 2017-02-16 DIAGNOSIS — M6281 Muscle weakness (generalized): Secondary | ICD-10-CM

## 2017-02-16 NOTE — Therapy (Signed)
Target Date  04/04/17      PT LONG TERM GOAL #4   Title  reach out to the side with Lt UE with < or = to 4/10 Lt shoulder pain reported    Time  8    Period  Days    Status  New    Target Date  04/04/17      PT LONG TERM GOAL #5   Title  put on jacket with 50% less Lt shoulder pain    Time  8    Period  Weeks    Status  New    Target Date  04/04/17            Plan - 02/16/17 1454     Clinical Impression Statement  Pt felt the dry needling helped a lot, felt much les pain. Anita Arroyo was able to lift 2# weights today without any pain. Pt did have a migraine so exercises were kept to a minimum.    Rehab Potential  Good    PT Frequency  2x / week    PT Duration  8 weeks    PT Treatment/Interventions  ADLs/Self Care Home Management;Cryotherapy;Electrical Stimulation;Iontophoresis 4mg /ml Dexamethasone;Therapeutic activities;Neuromuscular re-education;Patient/family education;Passive range of motion;Manual techniques;Dry needling;Taping;Vasopneumatic Device    PT Next Visit Plan  Continue with gentle, progressive shoulder strength, DN 2.    Consulted and Agree with Plan of Care  Patient       Patient will benefit from skilled therapeutic intervention in order to improve the following deficits and impairments:  Pain, Impaired flexibility, Decreased activity tolerance, Decreased range of motion, Decreased strength, Impaired UE functional use, Postural dysfunction, Increased muscle spasms  Visit Diagnosis: Muscle weakness (generalized)  Stiffness of left shoulder, not elsewhere classified  Chronic left shoulder pain  Abnormal posture  Acute pain of left shoulder     Problem List Patient Active Problem List   Diagnosis Date Noted  . Prediabetes 10/21/2016  . Headache 10/12/2016  . 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 Chronic pain syndrome 05/27/2016  . Osteoarthritis 05/16/2016  . Seasonal allergies 05/03/2016  . History of palpitations 09/16/2015  . Bipolar 1 disorder ([place]) 09/16/2015  . History of breast cancer 11/16/2012  . Fibromyalgia 11/16/2012  . Smoker 11/16/2012  . INTERTRIGO 04/13/2010  . Hyperlipidemia 09/02/2006  . Depression 09/02/2006  . Essential hypertension 09/02/2006  . COPD (chronic obstructive pulmonary disease) ([place]) 09/02/2006  . GERD 09/02/2006    Anita Arroyo, PTA 02/16/2017, 3:30 PM  [facility] [address] [place], [place], [address] Phone: [phone]   Fax:  [phone]  Name: Anita Arroyo MRN: [id number] Date of Birth: 17-Nov-1960  [facility] [address], [place], [place], [address] Phone: [phone]   Fax:  [phone]  Physical Therapy Treatment  Patient Details  Name: Anita Arroyo MRN: [id number] Date of Birth: October 14, 1960 Referring Provider: Edmonia Lynch, MD   Encounter Date: 02/16/2017  PT End of Session - 02/16/17 1453    Visit Number  4    Date for PT Re-Evaluation  04/04/17    Authorization Type  Medicare    PT Start Time  1450    PT Stop Time  1545    PT Time Calculation (min)  55 min    Activity Tolerance  Patient tolerated treatment well    Behavior During Therapy  [facility] for tasks assessed/performed       Past Medical History:  Diagnosis Date  . Arthritis   . Asthma   . Bipolar 1 disorder ([place])   . Bipolar 1 disorder ([place])   . Breast cancer ([place])   . Depression   . Dysrhythmia    palpitations  . Dysrhythmia    atrial fibrillation  . Fibromyalgia   . Finger fracture 2011  . GERD (gastroesophageal reflux disease)   . Heart murmur   . History of stress test 04/28/2011   showed normal perfusion without scar or ischemia  . Hx of echocardiogram 04/28/2011   showed normal systolic function with mild diastolic dysfunction,Anita Arroyo had trace MR but did not have frank mitral valve prolapse demonstrated. Anita Arroyo had mild pulmonary hypertension with an estimated RV systolic pressure at 34 mm.  . Hyperlipemia   . Meniere disease   . MVP (mitral valve prolapse)   . Neuromuscular disorder ([place])    fibromyalgia    Past Surgical History:  Procedure Laterality Date  . ANKLE SURGERY     left x4  . BREAST SURGERY  2011   lumpectomy right breast   . COLONOSCOPY WITH PROPOFOL N/A 02/25/2014   Procedure: COLONOSCOPY WITH PROPOFOL;  Surgeon: Garlan Fair, MD;  [facility];  Service: Endoscopy;  Laterality: N/A;  . deviated septum    . ECTOPIC PREGNANCY SURGERY    . ELBOW SURGERY     right elbow  . EYE SURGERY     cataract  surgery  . fibroid     2-3 fibroid adenomas removed  . JOINT REPLACEMENT  2011   right knee   . KNEE SURGERY     left knee  . SHOULDER ARTHROSCOPY WITH BICEPSTENOTOMY Right 09/25/2015   Procedure: SHOULDER ARTHROSCOPY WITH BICEPSTENOTOMY;  Surgeon: Ninetta Lights, MD;  Location: [facility];  Service: Orthopedics;  Laterality: Right;  . SHOULDER ARTHROSCOPY WITH DISTAL CLAVICLE RESECTION Right 09/25/2015   Procedure: SHOULDER ARTHROSCOPY WITH DISTAL CLAVICLE RESECTION;  Surgeon: Ninetta Lights, MD;  Location: [facility];  Service: Orthopedics;  Laterality: Right;  . SHOULDER ARTHROSCOPY WITH SUBACROMIAL DECOMPRESSION Right 09/25/2015   Procedure: RIGHT SHOULDER ARTHROSCOPY DEBRIDEMENT,ACROMIOPLASTY,DISTAL CLAVICAL EXCISION, RELEASE OF BICEPS TENDON;  Surgeon: Ninetta Lights, MD;  Location: [facility];  Service: Orthopedics;  Laterality: Right;  . TMJ ARTHROPLASTY    . TONSILLECTOMY    . TOTAL SHOULDER ARTHROPLASTY Left 01/05/2016  . UTERINE FIBROID SURGERY     polups and fibroids removed   . WRIST SURGERY     left    There were no vitals filed for this visit.  Subjective Assessment - 02/16/17 1455    Subjective  Dry needling really helped!    Pertinent History  [facility] [address], [place], [place], [address] Phone: [phone]   Fax:  [phone]  Physical Therapy Treatment  Patient Details  Name: Anita Arroyo MRN: [id number] Date of Birth: October 14, 1960 Referring Provider: Edmonia Lynch, MD   Encounter Date: 02/16/2017  PT End of Session - 02/16/17 1453    Visit Number  4    Date for PT Re-Evaluation  04/04/17    Authorization Type  Medicare    PT Start Time  1450    PT Stop Time  1545    PT Time Calculation (min)  55 min    Activity Tolerance  Patient tolerated treatment well    Behavior During Therapy  [facility] for tasks assessed/performed       Past Medical History:  Diagnosis Date  . Arthritis   . Asthma   . Bipolar 1 disorder ([place])   . Bipolar 1 disorder ([place])   . Breast cancer ([place])   . Depression   . Dysrhythmia    palpitations  . Dysrhythmia    atrial fibrillation  . Fibromyalgia   . Finger fracture 2011  . GERD (gastroesophageal reflux disease)   . Heart murmur   . History of stress test 04/28/2011   showed normal perfusion without scar or ischemia  . Hx of echocardiogram 04/28/2011   showed normal systolic function with mild diastolic dysfunction,Anita Arroyo had trace MR but did not have frank mitral valve prolapse demonstrated. Anita Arroyo had mild pulmonary hypertension with an estimated RV systolic pressure at 34 mm.  . Hyperlipemia   . Meniere disease   . MVP (mitral valve prolapse)   . Neuromuscular disorder ([place])    fibromyalgia    Past Surgical History:  Procedure Laterality Date  . ANKLE SURGERY     left x4  . BREAST SURGERY  2011   lumpectomy right breast   . COLONOSCOPY WITH PROPOFOL N/A 02/25/2014   Procedure: COLONOSCOPY WITH PROPOFOL;  Surgeon: Garlan Fair, MD;  [facility];  Service: Endoscopy;  Laterality: N/A;  . deviated septum    . ECTOPIC PREGNANCY SURGERY    . ELBOW SURGERY     right elbow  . EYE SURGERY     cataract  surgery  . fibroid     2-3 fibroid adenomas removed  . JOINT REPLACEMENT  2011   right knee   . KNEE SURGERY     left knee  . SHOULDER ARTHROSCOPY WITH BICEPSTENOTOMY Right 09/25/2015   Procedure: SHOULDER ARTHROSCOPY WITH BICEPSTENOTOMY;  Surgeon: Ninetta Lights, MD;  Location: [facility];  Service: Orthopedics;  Laterality: Right;  . SHOULDER ARTHROSCOPY WITH DISTAL CLAVICLE RESECTION Right 09/25/2015   Procedure: SHOULDER ARTHROSCOPY WITH DISTAL CLAVICLE RESECTION;  Surgeon: Ninetta Lights, MD;  Location: [facility];  Service: Orthopedics;  Laterality: Right;  . SHOULDER ARTHROSCOPY WITH SUBACROMIAL DECOMPRESSION Right 09/25/2015   Procedure: RIGHT SHOULDER ARTHROSCOPY DEBRIDEMENT,ACROMIOPLASTY,DISTAL CLAVICAL EXCISION, RELEASE OF BICEPS TENDON;  Surgeon: Ninetta Lights, MD;  Location: [facility];  Service: Orthopedics;  Laterality: Right;  . TMJ ARTHROPLASTY    . TONSILLECTOMY    . TOTAL SHOULDER ARTHROPLASTY Left 01/05/2016  . UTERINE FIBROID SURGERY     polups and fibroids removed   . WRIST SURGERY     left    There were no vitals filed for this visit.  Subjective Assessment - 02/16/17 1455    Subjective  Dry needling really helped!    Pertinent History

## 2017-02-16 NOTE — Telephone Encounter (Signed)
[place], [place] - [address]  Please send to pharmacy. Patient can not pick up

## 2017-02-17 ENCOUNTER — Telehealth: Payer: Self-pay

## 2017-02-17 DIAGNOSIS — G894 Chronic pain syndrome: Secondary | ICD-10-CM

## 2017-02-17 NOTE — Telephone Encounter (Signed)
Provider, please advise- see 02/11/17 phone encounter.

## 2017-02-17 NOTE — Addendum Note (Signed)
Addended by: Delle Reining on: 02/17/2017 09:43 AM   Modules accepted: Orders

## 2017-02-17 NOTE — Telephone Encounter (Signed)
Pt would like printed rx for hydrocodone sent electronically to pharmacy. Please advise.

## 2017-02-17 NOTE — Telephone Encounter (Signed)
This was done on 02/15/2017. Please verify that it was received by the pharmacy.

## 2017-02-18 MED ORDER — HYDROCODONE-ACETAMINOPHEN 5-325 MG PO TABS: ORAL_TABLET | ORAL | 0 refills | Status: DC

## 2017-02-18 NOTE — Telephone Encounter (Signed)
Please notify patient that the Rx has been sent electronically.  Please destroy Rx printed 02/15/2017.  Rx sent electronically.  Meds ordered this encounter  Medications  . HYDROcodone-acetaminophen (NORCO/VICODIN) 5-325 MG tablet    Sig: take 1 TABLET BY MOUTH EVERY 6 HOURS AS NEEDED for moderate pain    Dispense:  120 tablet    Refill:  0    Order Specific Question:   Supervising Provider    Answer:   Brigitte Pulse, EVA N [[id number]]

## 2017-02-18 NOTE — Telephone Encounter (Signed)
Pt.notified

## 2017-02-18 NOTE — Addendum Note (Signed)
Addended by: Fara Chute on: 02/18/2017 02:22 PM   Modules accepted: Orders

## 2017-02-18 NOTE — Telephone Encounter (Signed)
I called the pt the day it was  filled that she needed to come pick-up the Rx at the [address] since the electronic system were down.

## 2017-02-21 ENCOUNTER — Ambulatory Visit: Payer: Medicare HMO

## 2017-02-21 DIAGNOSIS — M25612 Stiffness of left shoulder, not elsewhere classified: Secondary | ICD-10-CM | POA: Diagnosis not present

## 2017-02-21 DIAGNOSIS — M25512 Pain in left shoulder: Secondary | ICD-10-CM

## 2017-02-21 DIAGNOSIS — M6281 Muscle weakness (generalized): Secondary | ICD-10-CM | POA: Diagnosis not present

## 2017-02-21 DIAGNOSIS — G8929 Other chronic pain: Secondary | ICD-10-CM

## 2017-02-21 DIAGNOSIS — R293 Abnormal posture: Secondary | ICD-10-CM | POA: Diagnosis not present

## 2017-02-21 NOTE — Therapy (Signed)
[facility] [address] [place], [place], [address] Phone: [phone]   Fax:  [phone]  Physical Therapy Treatment  Patient Details  Name: Anita Arroyo MRN: [id number] Date of Birth: 1960-09-11 Referring Provider: Edmonia Lynch, MD   Encounter Date: 02/21/2017  PT End of Session - 02/21/17 1443    Visit Number  5    Date for PT Re-Evaluation  04/04/17    Authorization Type  Medicare    PT Start Time  1400    PT Stop Time  1455    PT Time Calculation (min)  55 min    Activity Tolerance  Patient tolerated treatment well    Behavior During Therapy  [facility] for tasks assessed/performed       Past Medical History:  Diagnosis Date  . Arthritis   . Asthma   . Bipolar 1 disorder ([place])   . Bipolar 1 disorder ([place])   . Breast cancer ([place])   . Depression   . Dysrhythmia    palpitations  . Dysrhythmia    atrial fibrillation  . Fibromyalgia   . Finger fracture 2011  . GERD (gastroesophageal reflux disease)   . Heart murmur   . History of stress test 04/28/2011   showed normal perfusion without scar or ischemia  . Hx of echocardiogram 04/28/2011   showed normal systolic function with mild diastolic dysfunction,she had trace MR but did not have frank mitral valve prolapse demonstrated. She had mild pulmonary hypertension with an estimated RV systolic pressure at 34 mm.  . Hyperlipemia   . Meniere disease   . MVP (mitral valve prolapse)   . Neuromuscular disorder ([place])    fibromyalgia    Past Surgical History:  Procedure Laterality Date  . ANKLE SURGERY     left x4  . BREAST SURGERY  2011   lumpectomy right breast   . COLONOSCOPY WITH PROPOFOL N/A 02/25/2014   Procedure: COLONOSCOPY WITH PROPOFOL;  Surgeon: Garlan Fair, MD;  Location: [facility];  Service: Endoscopy;  Laterality: N/A;  . deviated septum    . ECTOPIC PREGNANCY SURGERY    . ELBOW SURGERY     right elbow  . EYE SURGERY     cataract  surgery  . fibroid     2-3 fibroid adenomas removed  . JOINT REPLACEMENT  2011   right knee   . KNEE SURGERY     left knee  . SHOULDER ARTHROSCOPY WITH BICEPSTENOTOMY Right 09/25/2015   Procedure: SHOULDER ARTHROSCOPY WITH BICEPSTENOTOMY;  Surgeon: Ninetta Lights, MD;  Location: [place];  Service: Orthopedics;  Laterality: Right;  . SHOULDER ARTHROSCOPY WITH DISTAL CLAVICLE RESECTION Right 09/25/2015   Procedure: SHOULDER ARTHROSCOPY WITH DISTAL CLAVICLE RESECTION;  Surgeon: Ninetta Lights, MD;  Location: [place];  Service: Orthopedics;  Laterality: Right;  . SHOULDER ARTHROSCOPY WITH SUBACROMIAL DECOMPRESSION Right 09/25/2015   Procedure: RIGHT SHOULDER ARTHROSCOPY DEBRIDEMENT,ACROMIOPLASTY,DISTAL CLAVICAL EXCISION, RELEASE OF BICEPS TENDON;  Surgeon: Ninetta Lights, MD;  Location: [place];  Service: Orthopedics;  Laterality: Right;  . TMJ ARTHROPLASTY    . TONSILLECTOMY    . TOTAL SHOULDER ARTHROPLASTY Left 01/05/2016  . UTERINE FIBROID SURGERY     polups and fibroids removed   . WRIST SURGERY     left    There were no vitals filed for this visit.  Subjective Assessment - 02/21/17 1359    Subjective  I can't make any decisions today. I cried 1/2  the day because my mom is going to have to put her cat to sleep.      Patient Stated Goals  improve strength of Lt shoulder, reduce Lt shoulder pain, use Lt arm without limitation    Currently in Pain?  Yes    Pain Score  3     Pain Location  Shoulder    Pain Orientation  Left    Pain Descriptors / Indicators  Sharp    Pain Type  Chronic pain    Pain Onset  More than a month ago    Pain Frequency  Constant    Aggravating Factors   reaching back, moving Lt UE against gravity    Pain Relieving Factors  rest, neutral Lt shoulder                      [facility] Adult PT Treatment/Exercise - 02/21/17 0001      Shoulder Exercises: Seated   Flexion  -- 2# lift to 90 degrees 2  x10, then scaption 2x10      Shoulder Exercises: Standing   Flexion  -- finger ladder x 10      Shoulder Exercises: Pulleys   Flexion  3 minutes    ABduction  3 minutes      Moist Heat Therapy   Number Minutes Moist Heat  15 Minutes    Moist Heat Location  Shoulder;Cervical      Manual Therapy   Manual Therapy  Soft tissue mobilization    Manual therapy comments  Manual soft tissue to anterior shoulder/pectorals and Lt upper traps       Trigger Point Dry Needling - 02/21/17 1405    Consent Given?  Yes    Muscles Treated Upper Body  Upper trapezius pec major, pec minor- Lt only    Upper Trapezius Response  Twitch reponse elicited;Palpable increased muscle length    Pectoralis Major Response  Twitch response elicited;Palpable increased muscle length    Pectoralis Minor Response  Twitch response elicited;Palpable increased muscle length             PT Short Term Goals - 02/07/17 1459      PT SHORT TERM GOAL #1   Title  be independent in initial HEP    Time  4    Period  Weeks    Status  New    Target Date  03/07/17      PT SHORT TERM GOAL #2   Title  demonstrate Lt shoulder A/ROM abduction with < or = to 6/10 pain to allow for reaching out to the side    Time  4    Period  Weeks    Status  New    Target Date  03/07/17      PT SHORT TERM GOAL #3   Title  demonstrate Lt shoulder A/ROM IR to L5 to improve self-care without substitution    Time  4    Period  Weeks    Status  New    Target Date  03/07/17      PT SHORT TERM GOAL #4   Title  report 25% less Lt shoulder pain with putting on her jacket    Time  4    Period  Weeks    Status  New    Target Date  03/07/17      PT SHORT TERM GOAL #5   Title  __        PT Long Term Goals -  02/07/17 1529      PT LONG TERM GOAL #1   Title  be independent in advanced HEP    Time  8    Period  Weeks    Status  New    Target Date  04/04/17      PT LONG TERM GOAL #2   Title  reduce FOTO to < or = to 38%  limitation    Time  8    Period  Weeks    Status  New    Target Date  04/04/17      PT LONG TERM GOAL #3   Title  demonstarte Lt shoulder A/ROM IR to L3 to perform self-care without limitation    Time  8    Period  Weeks    Status  New    Target Date  04/04/17      PT LONG TERM GOAL #4   Title  reach out to the side with Lt UE with < or = to 4/10 Lt shoulder pain reported    Time  8    Period  Days    Status  New    Target Date  04/04/17      PT LONG TERM GOAL #5   Title  put on jacket with 50% less Lt shoulder pain    Time  8    Period  Weeks    Status  New    Target Date  04/04/17            Plan - 02/21/17 1407    Clinical Impression Statement  Pt reports reduction in the frequency and intensity of Lt pectoralis pain.  Pt can reach behind to take off her coat without increased pain today.  Pt reports global rotator cuff soreness from new exercise.  Pt with tension and trigger points in Lt upper traps and pecoralis and demonstrated incresaed tissue mobility and reduced pain after dry needling today.  Pt will continue to benefit from skilled PT for Lt shoulder strength, flexibility and manual pain management as needed.      PT Frequency  2x / week    PT Duration  8 weeks    PT Treatment/Interventions  ADLs/Self Care Home Management;Cryotherapy;Electrical Stimulation;Iontophoresis 4mg /ml Dexamethasone;Therapeutic activities;Neuromuscular re-education;Patient/family education;Passive range of motion;Manual techniques;Dry needling;Taping;Vasopneumatic Device    PT Next Visit Plan  Continue with gentle, progressive shoulder strength, assess response to dry needling    Recommended Other Services  initial certification is signed    Consulted and Agree with Plan of Care  Patient       Patient will benefit from skilled therapeutic intervention in order to improve the following deficits and impairments:  Pain, Impaired flexibility, Decreased activity tolerance, Decreased range of  motion, Decreased strength, Impaired UE functional use, Postural dysfunction, Increased muscle spasms  Visit Diagnosis: Muscle weakness (generalized)  Stiffness of left shoulder, not elsewhere classified  Chronic left shoulder pain  Abnormal posture     Problem List Patient Active Problem List   Diagnosis Date Noted  . Prediabetes 10/21/2016  . Headache 10/12/2016  . 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 Chronic pain syndrome 05/27/2016  . Osteoarthritis 05/16/2016  . Seasonal allergies 05/03/2016  . History of palpitations 09/16/2015  . Bipolar 1 disorder ([place]) 09/16/2015  . History of breast cancer 11/16/2012  . Fibromyalgia 11/16/2012  . Smoker 11/16/2012  . INTERTRIGO 04/13/2010  . Hyperlipidemia 09/02/2006  . Depression 09/02/2006  . Essential hypertension 09/02/2006  . COPD (chronic obstructive pulmonary  disease) ([place]) 09/02/2006  . GERD 09/02/2006    Sigurd Sos, PT 02/21/17 2:48 PM  [facility] [address], [place], [place], [address] Phone: [phone]   Fax:  [phone]  Name: Anita Arroyo MRN: [id number] Date of Birth: 07/07/1960

## 2017-02-23 ENCOUNTER — Encounter: Payer: Self-pay | Admitting: Physical Therapy

## 2017-02-23 ENCOUNTER — Ambulatory Visit: Payer: Medicare HMO | Admitting: Physical Therapy

## 2017-02-23 DIAGNOSIS — G8929 Other chronic pain: Secondary | ICD-10-CM | POA: Diagnosis not present

## 2017-02-23 DIAGNOSIS — M6281 Muscle weakness (generalized): Secondary | ICD-10-CM | POA: Diagnosis not present

## 2017-02-23 DIAGNOSIS — M25612 Stiffness of left shoulder, not elsewhere classified: Secondary | ICD-10-CM | POA: Diagnosis not present

## 2017-02-23 DIAGNOSIS — R293 Abnormal posture: Secondary | ICD-10-CM

## 2017-02-23 DIAGNOSIS — M25512 Pain in left shoulder: Secondary | ICD-10-CM | POA: Diagnosis not present

## 2017-02-23 NOTE — Therapy (Signed)
[facility] [address], [place], [place], [address] Phone: [phone]   Fax:  [phone]  Physical Therapy Treatment  Patient Details  Name: Anita Arroyo MRN: [id number] Date of Birth: [birth year] Referring Provider: Edmonia Lynch, MD   Encounter Date: 02/23/2017  PT End of Session - 02/23/17 1450    Visit Number  6    Date for PT Re-Evaluation  04/04/17    Authorization Type  Medicare    PT Start Time  6962    PT Stop Time  1540    PT Time Calculation (min)  55 min    Activity Tolerance  Patient tolerated treatment well    Behavior During Therapy  [facility] for tasks assessed/performed       Past Medical History:  Diagnosis Date  . Arthritis   . Asthma   . Bipolar 1 disorder ([place])   . Bipolar 1 disorder ([place])   . Breast cancer ([place])   . Depression   . Dysrhythmia    palpitations  . Dysrhythmia    atrial fibrillation  . Fibromyalgia   . Finger fracture 2011  . GERD (gastroesophageal reflux disease)   . Heart murmur   . History of stress test 04/28/2011   showed normal perfusion without scar or ischemia  . Hx of echocardiogram 04/28/2011   showed normal systolic function with mild diastolic dysfunction,she had trace MR but did not have frank mitral valve prolapse demonstrated. She had mild pulmonary hypertension with an estimated RV systolic pressure at 34 mm.  . Hyperlipemia   . Meniere disease   . MVP (mitral valve prolapse)   . Neuromuscular disorder ([place])    fibromyalgia    Past Surgical History:  Procedure Laterality Date  . ANKLE SURGERY     left x4  . BREAST SURGERY  2011   lumpectomy right breast   . COLONOSCOPY WITH PROPOFOL N/A 02/25/2014   Procedure: COLONOSCOPY WITH PROPOFOL;  Surgeon: Garlan Fair, MD;  Location: [facility];  Service: Endoscopy;  Laterality: N/A;  . deviated septum    . ECTOPIC PREGNANCY SURGERY    . ELBOW SURGERY     right elbow  . EYE SURGERY     cataract  surgery  . fibroid     2-3 fibroid adenomas removed  . JOINT REPLACEMENT  2011   right knee   . KNEE SURGERY     left knee  . SHOULDER ARTHROSCOPY WITH BICEPSTENOTOMY Right 09/25/2015   Procedure: SHOULDER ARTHROSCOPY WITH BICEPSTENOTOMY;  Surgeon: Ninetta Lights, MD;  Location: [place];  Service: Orthopedics;  Laterality: Right;  . SHOULDER ARTHROSCOPY WITH DISTAL CLAVICLE RESECTION Right 09/25/2015   Procedure: SHOULDER ARTHROSCOPY WITH DISTAL CLAVICLE RESECTION;  Surgeon: Ninetta Lights, MD;  Location: [place];  Service: Orthopedics;  Laterality: Right;  . SHOULDER ARTHROSCOPY WITH SUBACROMIAL DECOMPRESSION Right 09/25/2015   Procedure: RIGHT SHOULDER ARTHROSCOPY DEBRIDEMENT,ACROMIOPLASTY,DISTAL CLAVICAL EXCISION, RELEASE OF BICEPS TENDON;  Surgeon: Ninetta Lights, MD;  Location: [place];  Service: Orthopedics;  Laterality: Right;  . TMJ ARTHROPLASTY    . TONSILLECTOMY    . TOTAL SHOULDER ARTHROPLASTY Left 01/05/2016  . UTERINE FIBROID SURGERY     polups and fibroids removed   . WRIST SURGERY     left    There were no vitals filed for this visit.  Subjective Assessment - 02/23/17 1452    Subjective  I have only had sharp pains at the front  of my shoulder 1x over the last few days. Still has intermittent neck and thumb pains.     Pertinent History  Rt breast cancer, Lt shoulder replacement 12/2015, Rt TKA .  Pt is hard of hearing and cries during treatment at times    Currently in Pain?  Yes    Pain Score  4     Pain Location  Shoulder    Pain Orientation  Left    Pain Descriptors / Indicators  Dull    Multiple Pain Sites  No                      [facility] Adult PT Treatment/Exercise - 02/23/17 0001      Shoulder Exercises: Seated   Horizontal ABduction  Strengthening;Both;10 reps;Theraband    Theraband Level (Shoulder Horizontal ABduction)  Level 2 (Red)    External Rotation  Strengthening;Both;20  reps;Theraband    Theraband Level (Shoulder External Rotation)  Level 2 (Red)    Flexion  -- 2# lift to 90 degrees 2 x15, then scaption x15    Abduction  Strengthening;Left;15 reps;Weights 2#      Moist Heat Therapy   Number Minutes Moist Heat  15 Minutes    Moist Heat Location  Shoulder;Cervical      Electrical Stimulation   Electrical Stimulation Location  Lt shoulder/lateral neck    Electrical Stimulation Action  IFC    Electrical Stimulation Parameters  80-150 Hz    Electrical Stimulation Goals  Pain               PT Short Term Goals - 02/07/17 1459      PT SHORT TERM GOAL #1   Title  be independent in initial HEP    Time  4    Period  Weeks    Status  New    Target Date  03/07/17      PT SHORT TERM GOAL #2   Title  demonstrate Lt shoulder A/ROM abduction with < or = to 6/10 pain to allow for reaching out to the side    Time  4    Period  Weeks    Status  New    Target Date  03/07/17      PT SHORT TERM GOAL #3   Title  demonstrate Lt shoulder A/ROM IR to L5 to improve self-care without substitution    Time  4    Period  Weeks    Status  New    Target Date  03/07/17      PT SHORT TERM GOAL #4   Title  report 25% less Lt shoulder pain with putting on her jacket    Time  4    Period  Weeks    Status  New    Target Date  03/07/17      PT SHORT TERM GOAL #5   Title  __        PT Long Term Goals - 02/07/17 1529      PT LONG TERM GOAL #1   Title  be independent in advanced HEP    Time  8    Period  Weeks    Status  New    Target Date  04/04/17      PT LONG TERM GOAL #2   Title  reduce FOTO to < or = to 38% limitation    Time  8    Period  Weeks    Status  New  Target Date  04/04/17      PT LONG TERM GOAL #3   Title  demonstarte Lt shoulder A/ROM IR to L3 to perform self-care without limitation    Time  8    Period  Weeks    Status  New    Target Date  04/04/17      PT LONG TERM GOAL #4   Title  reach out to the side with Lt UE with <  or = to 4/10 Lt shoulder pain reported    Time  8    Period  Days    Status  New    Target Date  04/04/17      PT LONG TERM GOAL #5   Title  put on jacket with 50% less Lt shoulder pain    Time  8    Period  Weeks    Status  New    Target Date  04/04/17            Plan - 02/23/17 1450    Clinical Impression Statement  Pt reports only sharp pain at the anterior shoulder/pec area 1x over the course of the last few weeks. She reports the dry needling has really helped. She does report the soreness she is experiencing is more likely muscular soreness. We kept the weights the same today but increased reps to work on functional activity tolerance. Pt reported todays exercises totally exhausted her LT arm & hand.      Rehab Potential  Good    PT Frequency  2x / week    PT Duration  8 weeks    PT Treatment/Interventions  ADLs/Self Care Home Management;Cryotherapy;Electrical Stimulation;Iontophoresis 4mg /ml Dexamethasone;Therapeutic activities;Neuromuscular re-education;Patient/family education;Passive range of motion;Manual techniques;Dry needling;Taping;Vasopneumatic Device    PT Next Visit Plan  Continue with gentle, progressive shoulder strength, DN if seen by PT.    Consulted and Agree with Plan of Care  --       Patient will benefit from skilled therapeutic intervention in order to improve the following deficits and impairments:  Pain, Impaired flexibility, Decreased activity tolerance, Decreased range of motion, Decreased strength, Impaired UE functional use, Postural dysfunction, Increased muscle spasms  Visit Diagnosis: Muscle weakness (generalized)  Stiffness of left shoulder, not elsewhere classified  Chronic left shoulder pain  Abnormal posture  Acute pain of left shoulder     Problem List Patient Active Problem List   Diagnosis Date Noted  . Prediabetes 10/21/2016  . Headache 10/12/2016  . 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 Chronic pain syndrome  05/27/2016  . Osteoarthritis 05/16/2016  . Seasonal allergies 05/03/2016  . History of palpitations 09/16/2015  . Bipolar 1 disorder ([place]) 09/16/2015  . History of breast cancer 11/16/2012  . Fibromyalgia 11/16/2012  . Smoker 11/16/2012  . INTERTRIGO 04/13/2010  . Hyperlipidemia 09/02/2006  . Depression 09/02/2006  . Essential hypertension 09/02/2006  . COPD (chronic obstructive pulmonary disease) ([place]) 09/02/2006  . GERD 09/02/2006    Anita Arroyo, PTA 02/23/2017, 3:31 PM  [facility] [address], [place], [place], [address] Phone: [phone]   Fax:  [phone]  Name: Anita Arroyo MRN: [id number] Date of Birth: 1960/03/11

## 2017-02-28 ENCOUNTER — Encounter: Payer: Self-pay | Admitting: Physical Therapy

## 2017-02-28 ENCOUNTER — Ambulatory Visit: Payer: Medicare HMO | Attending: Orthopedic Surgery | Admitting: Physical Therapy

## 2017-02-28 DIAGNOSIS — R293 Abnormal posture: Secondary | ICD-10-CM | POA: Diagnosis not present

## 2017-02-28 DIAGNOSIS — G8929 Other chronic pain: Secondary | ICD-10-CM | POA: Diagnosis not present

## 2017-02-28 DIAGNOSIS — R600 Localized edema: Secondary | ICD-10-CM | POA: Insufficient documentation

## 2017-02-28 DIAGNOSIS — M25512 Pain in left shoulder: Secondary | ICD-10-CM | POA: Diagnosis not present

## 2017-02-28 DIAGNOSIS — M25612 Stiffness of left shoulder, not elsewhere classified: Secondary | ICD-10-CM | POA: Diagnosis not present

## 2017-02-28 DIAGNOSIS — M6281 Muscle weakness (generalized): Secondary | ICD-10-CM | POA: Diagnosis not present

## 2017-02-28 DIAGNOSIS — M25611 Stiffness of right shoulder, not elsewhere classified: Secondary | ICD-10-CM | POA: Diagnosis not present

## 2017-02-28 DIAGNOSIS — M25511 Pain in right shoulder: Secondary | ICD-10-CM | POA: Diagnosis not present

## 2017-02-28 NOTE — Therapy (Signed)
[facility] [address], [address] [place], [place], [address] Phone: [phone]   Fax:  [phone]  Physical Therapy Treatment  Patient Details  Name: Anita Arroyo MRN: [id number] Date of Birth: [birth year] Referring Provider: Margarita Rana, MD   Encounter Date: 02/28/2017  Anita Arroyo End of Session - 02/28/17 1402    Visit Number  7    Date for Anita Arroyo Re-Evaluation  04/04/17    Authorization Type  Medicare    Anita Arroyo Start Time  1359    Anita Arroyo Stop Time  1500    Anita Arroyo Time Calculation (min)  61 min    Activity Tolerance  Patient tolerated treatment well    Behavior During Therapy  [facility] for tasks assessed/performed       Past Medical History:  Diagnosis Date  . Arthritis   . Asthma   . Bipolar 1 disorder (HCC)   . Bipolar 1 disorder (HCC)   . Breast cancer (HCC)   . Depression   . Dysrhythmia    palpitations  . Dysrhythmia    atrial fibrillation  . Fibromyalgia   . Finger fracture 2011  . GERD (gastroesophageal reflux disease)   . Heart murmur   . History of stress test 04/28/2011   showed normal perfusion without scar or ischemia  . Hx of echocardiogram 04/28/2011   showed normal systolic function with mild diastolic dysfunction,she had trace MR but did not have frank mitral valve prolapse demonstrated. She had mild pulmonary hypertension with an estimated RV systolic pressure at 34 mm.  . Hyperlipemia   . Meniere disease   . MVP (mitral valve prolapse)   . Neuromuscular disorder (HCC)    fibromyalgia    Past Surgical History:  Procedure Laterality Date  . ANKLE SURGERY     left x4  . BREAST SURGERY  2011   lumpectomy right breast   . COLONOSCOPY WITH PROPOFOL N/A 02/25/2014   Procedure: COLONOSCOPY WITH PROPOFOL;  Surgeon: Charolett Bumpers, MD;  Location: [facility];  Service: Endoscopy;  Laterality: N/A;  . deviated septum    . ECTOPIC PREGNANCY SURGERY    . ELBOW SURGERY     right elbow  . EYE SURGERY     cataract  surgery  . fibroid     2-3 fibroid adenomas removed  . JOINT REPLACEMENT  2011   right knee   . KNEE SURGERY     left knee  . SHOULDER ARTHROSCOPY WITH BICEPSTENOTOMY Right 09/25/2015   Procedure: SHOULDER ARTHROSCOPY WITH BICEPSTENOTOMY;  Surgeon: Loreta Ave, MD;  Location: [facility];  Service: Orthopedics;  Laterality: Right;  . SHOULDER ARTHROSCOPY WITH DISTAL CLAVICLE RESECTION Right 09/25/2015   Procedure: SHOULDER ARTHROSCOPY WITH DISTAL CLAVICLE RESECTION;  Surgeon: Loreta Ave, MD;  Location: [facility];  Service: Orthopedics;  Laterality: Right;  . SHOULDER ARTHROSCOPY WITH SUBACROMIAL DECOMPRESSION Right 09/25/2015   Procedure: RIGHT SHOULDER ARTHROSCOPY DEBRIDEMENT,ACROMIOPLASTY,DISTAL CLAVICAL EXCISION, RELEASE OF BICEPS TENDON;  Surgeon: Loreta Ave, MD;  Location: [facility];  Service: Orthopedics;  Laterality: Right;  . TMJ ARTHROPLASTY    . TONSILLECTOMY    . TOTAL SHOULDER ARTHROPLASTY Left 01/05/2016  . UTERINE FIBROID SURGERY     polups and fibroids removed   . WRIST SURGERY     left    There were no vitals filed for this visit.  Subjective Assessment - 02/28/17 1403    Subjective  Feeling pretty good today, I cannot saty for entire  treatment as I am helping my mother move.     Currently in Pain?  No/denies    Multiple Pain Sites  No                      [facility] Adult Anita Arroyo Treatment/Exercise - 02/28/17 0001      Shoulder Exercises: Seated   Horizontal ABduction  Strengthening;Both;20 reps;Theraband    Theraband Level (Shoulder Horizontal ABduction)  Level 2 (Red)    External Rotation  Strengthening;Both;20 reps;Theraband    Theraband Level (Shoulder External Rotation)  Level 2 (Red)    Flexion  -- 2# lift to 90 degrees 2 x15, then scaption x15    Abduction  Strengthening;Left;10 reps;Weights 2#      Shoulder Exercises: Pulleys   Flexion  3 minutes    ABduction  3 minutes      Moist Heat  Therapy   Number Minutes Moist Heat  15 Minutes    Moist Heat Location  Shoulder;Cervical      Manual Therapy   Manual Therapy  Soft tissue mobilization    Manual therapy comments  Manual soft tissue to anterior shoulder/pectorals and Lt upper traps       Trigger Point Dry Needling - 02/28/17 1415    Consent Given?  Yes DN performed by Anita Arroyo, Anita Arroyo    Muscles Treated Upper Body  Upper trapezius;Pectoralis major;Pectoralis minor Lt only    Upper Trapezius Response  Twitch reponse elicited;Palpable increased muscle length    Pectoralis Major Response  Twitch response elicited;Palpable increased muscle length    Pectoralis Minor Response  Twitch response elicited;Palpable increased muscle length             Anita Arroyo Short Term Goals - 02/28/17 1424      Anita Arroyo SHORT TERM GOAL #1   Title  be independent in initial HEP    Time  4    Period  Weeks    Status  Achieved        Anita Arroyo Long Term Goals - 02/07/17 1529      Anita Arroyo LONG TERM GOAL #1   Title  be independent in advanced HEP    Time  8    Period  Weeks    Status  New    Target Date  04/04/17      Anita Arroyo LONG TERM GOAL #2   Title  reduce FOTO to < or = to 38% limitation    Time  8    Period  Weeks    Status  New    Target Date  04/04/17      Anita Arroyo LONG TERM GOAL #3   Title  demonstarte Lt shoulder A/ROM IR to L3 to perform self-care without limitation    Time  8    Period  Weeks    Status  New    Target Date  04/04/17      Anita Arroyo LONG TERM GOAL #4   Title  reach out to the side with Lt UE with < or = to 4/10 Lt shoulder pain reported    Time  8    Period  Days    Status  New    Target Date  04/04/17      Anita Arroyo LONG TERM GOAL #5   Title  put on jacket with 50% less Lt shoulder pain    Time  8    Period  Weeks    Status  New    Target Date  04/04/17  Plan - 02/28/17 1402    Clinical Impression Statement  Anita Arroyo has had multiple "good days" with minimal to no pain in her LT shoulder. She has been helping her mom  move which includes packing boxes, lifting them and carrying all without any reported exacerbations. Anita Arroyo was able to tolerate better more work in terms of duration of activity, not more weight. This way we can work towards more activity tolerance. Anita Arroyo also received dry needling today by the Anita Arroyo per Anita Arroyo request..    Rehab Potential  Good    Anita Arroyo Frequency  2x / week    Anita Arroyo Duration  8 weeks    Anita Arroyo Treatment/Interventions  ADLs/Self Care Home Management;Cryotherapy;Electrical Stimulation;Iontophoresis 4mg /ml Dexamethasone;Therapeutic activities;Neuromuscular re-education;Patient/family education;Passive range of motion;Manual techniques;Dry needling;Taping;Vasopneumatic Device    Anita Arroyo Next Visit Plan  Continue with gentle, progressive shoulder strength, DN if seen by Anita Arroyo.    Consulted and Agree with Plan of Care  Patient       Patient will benefit from skilled therapeutic intervention in order to improve the following deficits and impairments:  Pain, Impaired flexibility, Decreased activity tolerance, Decreased range of motion, Decreased strength, Impaired UE functional use, Postural dysfunction, Increased muscle spasms  Visit Diagnosis: Muscle weakness (generalized)  Stiffness of left shoulder, not elsewhere classified  Chronic left shoulder pain  Abnormal posture  Acute pain of left shoulder     Problem List Patient Active Problem List   Diagnosis Date Noted  . Prediabetes 10/21/2016  . Headache 10/12/2016  . 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 Chronic pain syndrome 05/27/2016  . Osteoarthritis 05/16/2016  . Seasonal allergies 05/03/2016  . History of palpitations 09/16/2015  . Bipolar 1 disorder (HCC) 09/16/2015  . History of breast cancer 11/16/2012  . Fibromyalgia 11/16/2012  . Smoker 11/16/2012  . INTERTRIGO 04/13/2010  . Hyperlipidemia 09/02/2006  . Depression 09/02/2006  . Essential hypertension 09/02/2006  . COPD (chronic obstructive pulmonary disease) (HCC)  09/02/2006  . GERD 09/02/2006    Anita Arroyo, Anita Arroyo 02/28/2017, 2:46 PM  [facility] [address], [address] [place], [place], [address] Phone: [phone]   Fax:  [phone]  Name: Anita Arroyo MRN: [id number] Date of Birth: 19-Oct-1960

## 2017-03-02 ENCOUNTER — Encounter: Payer: Self-pay | Admitting: Physical Therapy

## 2017-03-07 DIAGNOSIS — M79642 Pain in left hand: Secondary | ICD-10-CM | POA: Diagnosis not present

## 2017-03-08 ENCOUNTER — Ambulatory Visit (HOSPITAL_COMMUNITY): Payer: Self-pay | Admitting: Psychiatry

## 2017-03-09 ENCOUNTER — Encounter: Payer: Self-pay | Admitting: Physical Therapy

## 2017-03-09 ENCOUNTER — Ambulatory Visit: Payer: Medicare HMO | Admitting: Physical Therapy

## 2017-03-09 DIAGNOSIS — M25512 Pain in left shoulder: Secondary | ICD-10-CM | POA: Diagnosis not present

## 2017-03-09 DIAGNOSIS — M25611 Stiffness of right shoulder, not elsewhere classified: Secondary | ICD-10-CM

## 2017-03-09 DIAGNOSIS — M25612 Stiffness of left shoulder, not elsewhere classified: Secondary | ICD-10-CM | POA: Diagnosis not present

## 2017-03-09 DIAGNOSIS — R600 Localized edema: Secondary | ICD-10-CM | POA: Diagnosis not present

## 2017-03-09 DIAGNOSIS — R293 Abnormal posture: Secondary | ICD-10-CM | POA: Diagnosis not present

## 2017-03-09 DIAGNOSIS — M6281 Muscle weakness (generalized): Secondary | ICD-10-CM | POA: Diagnosis not present

## 2017-03-09 DIAGNOSIS — M25511 Pain in right shoulder: Secondary | ICD-10-CM | POA: Diagnosis not present

## 2017-03-09 DIAGNOSIS — G8929 Other chronic pain: Secondary | ICD-10-CM

## 2017-03-09 NOTE — Therapy (Signed)
[facility] [address] [place], [place], [address] Phone: [phone]   Fax:  [phone]  Physical Therapy Treatment  Patient Details  Name: Anita Arroyo MRN: [id number] Date of Birth: 1960/04/05 Referring Provider: Edmonia Lynch, MD   Encounter Date: 03/09/2017  PT End of Session - 03/09/17 1405    Visit Number  8    Date for PT Re-Evaluation  04/04/17    Authorization Type  Medicare    PT Start Time  1400    PT Stop Time  1459    PT Time Calculation (min)  59 min    Activity Tolerance  Patient tolerated treatment well    Behavior During Therapy  [facility] for tasks assessed/performed       Past Medical History:  Diagnosis Date  . Arthritis   . Asthma   . Bipolar 1 disorder ([place])   . Bipolar 1 disorder ([place])   . Breast cancer ([place])   . Depression   . Dysrhythmia    palpitations  . Dysrhythmia    atrial fibrillation  . Fibromyalgia   . Finger fracture 2011  . GERD (gastroesophageal reflux disease)   . Heart murmur   . History of stress test 04/28/2011   showed normal perfusion without scar or ischemia  . Hx of echocardiogram 04/28/2011   showed normal systolic function with mild diastolic dysfunction,she had trace MR but did not have frank mitral valve prolapse demonstrated. She had mild pulmonary hypertension with an estimated RV systolic pressure at 34 mm.  . Hyperlipemia   . Meniere disease   . MVP (mitral valve prolapse)   . Neuromuscular disorder ([place])    fibromyalgia    Past Surgical History:  Procedure Laterality Date  . ANKLE SURGERY     left x4  . BREAST SURGERY  2011   lumpectomy right breast   . COLONOSCOPY WITH PROPOFOL N/A 02/25/2014   Procedure: COLONOSCOPY WITH PROPOFOL;  Surgeon: Garlan Fair, MD;  [facility]: [facility];  Service: Endoscopy;  Laterality: N/A;  . deviated septum    . ECTOPIC PREGNANCY SURGERY    . ELBOW SURGERY     right elbow  . EYE SURGERY     cataract  surgery  . fibroid     2-3 fibroid adenomas removed  . JOINT REPLACEMENT  2011   right knee   . KNEE SURGERY     left knee  . SHOULDER ARTHROSCOPY WITH BICEPSTENOTOMY Right 09/25/2015   Procedure: SHOULDER ARTHROSCOPY WITH BICEPSTENOTOMY;  Surgeon: Ninetta Lights, MD;  Location: [place];  Service: Orthopedics;  Laterality: Right;  . SHOULDER ARTHROSCOPY WITH DISTAL CLAVICLE RESECTION Right 09/25/2015   Procedure: SHOULDER ARTHROSCOPY WITH DISTAL CLAVICLE RESECTION;  Surgeon: Ninetta Lights, MD;  Location: [place];  Service: Orthopedics;  Laterality: Right;  . SHOULDER ARTHROSCOPY WITH SUBACROMIAL DECOMPRESSION Right 09/25/2015   Procedure: RIGHT SHOULDER ARTHROSCOPY DEBRIDEMENT,ACROMIOPLASTY,DISTAL CLAVICAL EXCISION, RELEASE OF BICEPS TENDON;  Surgeon: Ninetta Lights, MD;  Location: [place];  Service: Orthopedics;  Laterality: Right;  . TMJ ARTHROPLASTY    . TONSILLECTOMY    . TOTAL SHOULDER ARTHROPLASTY Left 01/05/2016  . UTERINE FIBROID SURGERY     polups and fibroids removed   . WRIST SURGERY     left    There were no vitals filed for this visit.  Subjective Assessment - 03/09/17 1406    Subjective  Been helping my Mom pack so I have been  doing a lot of of stooping so my back is tender.     Pertinent History  Rt breast cancer, Lt shoulder replacement 12/2015, Rt TKA .  Pt is hard of hearing and cries during treatment at times    Currently in Pain?  Yes    Pain Score  3     Pain Location  Shoulder    Pain Orientation  Left    Pain Descriptors / Indicators  Dull    Aggravating Factors   Overdoing    Pain Relieving Factors  exercises, needling    Multiple Pain Sites  No                      [facility] Adult PT Treatment/Exercise - 03/09/17 0001      Shoulder Exercises: Seated   External Rotation  Strengthening;Both;15 reps;Theraband 2 sets 15    Flexion  -- 2# lift to 90 degrees 2 x15, then scaption x15     Abduction  Strengthening;Left;10 reps;Weights 2#      Shoulder Exercises: Standing   Flexion  -- Finger ladder with 1 1/2 wrist wt 6x    Other Standing Exercises  1# to second shelf 10x, 2# second shelf 10x    Other Standing Exercises  wall pushups 10x  UE ranger overhead 2x10       Shoulder Exercises: Pulleys   Flexion  3 minutes    ABduction  3 minutes      Moist Heat Therapy   Number Minutes Moist Heat  15 Minutes    Moist Heat Location  Shoulder;Cervical post session               PT Short Term Goals - 03/09/17 1416      PT SHORT TERM GOAL #4   Title  report 25% less Lt shoulder pain with putting on her jacket    Time  4    Period  Weeks    Status  Achieved 40%        PT Long Term Goals - 02/07/17 1529      PT LONG TERM GOAL #1   Title  be independent in advanced HEP    Time  8    Period  Weeks    Status  New    Target Date  04/04/17      PT LONG TERM GOAL #2   Title  reduce FOTO to < or = to 38% limitation    Time  8    Period  Weeks    Status  New    Target Date  04/04/17      PT LONG TERM GOAL #3   Title  demonstarte Lt shoulder A/ROM IR to L3 to perform self-care without limitation    Time  8    Period  Weeks    Status  New    Target Date  04/04/17      PT LONG TERM GOAL #4   Title  reach out to the side with Lt UE with < or = to 4/10 Lt shoulder pain reported    Time  8    Period  Days    Status  New    Target Date  04/04/17      PT LONG TERM GOAL #5   Title  put on jacket with 50% less Lt shoulder pain    Time  8    Period  Weeks    Status  New    Target  Date  04/04/17            Plan - 03/09/17 1405    Clinical Impression Statement  Pt reports her pain that used to stop her in her tracks has reduced in frequency; reported 60% improved. She still fatigues with her 2# weights during 3 way raises but this fatigue happens much later in the exercise and she can finish the exercise much stronger.     Rehab Potential  Good    PT  Frequency  2x / week    PT Duration  8 weeks    PT Treatment/Interventions  ADLs/Self Care Home Management;Cryotherapy;Electrical Stimulation;Iontophoresis 4mg /ml Dexamethasone;Therapeutic activities;Neuromuscular re-education;Patient/family education;Passive range of motion;Manual techniques;Dry needling;Taping;Vasopneumatic Device    PT Next Visit Plan  Continue with gentle, progressive shoulder strength, DN if seen by PT.    Consulted and Agree with Plan of Care  Patient       Patient will benefit from skilled therapeutic intervention in order to improve the following deficits and impairments:  Pain, Impaired flexibility, Decreased activity tolerance, Decreased range of motion, Decreased strength, Impaired UE functional use, Postural dysfunction, Increased muscle spasms  Visit Diagnosis: Muscle weakness (generalized)  Stiffness of left shoulder, not elsewhere classified  Chronic left shoulder pain  Abnormal posture  Acute pain of left shoulder  Stiffness of right shoulder, not elsewhere classified  Acute pain of right shoulder     Problem List Patient Active Problem List   Diagnosis Date Noted  . Prediabetes 10/21/2016  . Headache 10/12/2016  . 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 Chronic pain syndrome 05/27/2016  . Osteoarthritis 05/16/2016  . Seasonal allergies 05/03/2016  . History of palpitations 09/16/2015  . Bipolar 1 disorder ([place]) 09/16/2015  . History of breast cancer 11/16/2012  . Fibromyalgia 11/16/2012  . Smoker 11/16/2012  . INTERTRIGO 04/13/2010  . Hyperlipidemia 09/02/2006  . Depression 09/02/2006  . Essential hypertension 09/02/2006  . COPD (chronic obstructive pulmonary disease) ([place]) 09/02/2006  . GERD 09/02/2006    COCHRAN,JENNIFER, PTA 03/09/2017, 2:43 PM  [facility] [address] [place], [place], [address] Phone: [phone]   Fax:  [phone]  Name: Anita Arroyo MRN: [id number] Date of Birth: May 05, 1960

## 2017-03-14 ENCOUNTER — Other Ambulatory Visit: Payer: Self-pay

## 2017-03-14 ENCOUNTER — Encounter: Payer: Self-pay | Admitting: Physician Assistant

## 2017-03-14 ENCOUNTER — Ambulatory Visit: Payer: Medicare HMO

## 2017-03-14 DIAGNOSIS — D485 Neoplasm of uncertain behavior of skin: Secondary | ICD-10-CM | POA: Diagnosis not present

## 2017-03-14 DIAGNOSIS — G8929 Other chronic pain: Secondary | ICD-10-CM

## 2017-03-14 DIAGNOSIS — M6281 Muscle weakness (generalized): Secondary | ICD-10-CM

## 2017-03-14 DIAGNOSIS — M25511 Pain in right shoulder: Secondary | ICD-10-CM | POA: Diagnosis not present

## 2017-03-14 DIAGNOSIS — R293 Abnormal posture: Secondary | ICD-10-CM

## 2017-03-14 DIAGNOSIS — M25612 Stiffness of left shoulder, not elsewhere classified: Secondary | ICD-10-CM

## 2017-03-14 DIAGNOSIS — M25512 Pain in left shoulder: Secondary | ICD-10-CM | POA: Diagnosis not present

## 2017-03-14 DIAGNOSIS — M25611 Stiffness of right shoulder, not elsewhere classified: Secondary | ICD-10-CM | POA: Diagnosis not present

## 2017-03-14 DIAGNOSIS — L430 Hypertrophic lichen planus: Secondary | ICD-10-CM | POA: Diagnosis not present

## 2017-03-14 DIAGNOSIS — R600 Localized edema: Secondary | ICD-10-CM | POA: Diagnosis not present

## 2017-03-14 NOTE — Therapy (Signed)
[facility] [address], [place], [place], [address] Phone: [phone]   Fax:  [phone]  Physical Therapy Treatment  Patient Details  Name: Anita Arroyo MRN: [id number] Date of Birth: 03/15/1960 Referring Provider: Edmonia Lynch, MD   Encounter Date: 03/14/2017  PT End of Session - 03/14/17 1441    Visit Number  9    Date for PT Re-Evaluation  04/04/17    Authorization Type  Medicare    PT Start Time  1402 dry needling    PT Stop Time  1455    PT Time Calculation (min)  53 min    Activity Tolerance  Patient tolerated treatment well    Behavior During Therapy  [facility] for tasks assessed/performed       Past Medical History:  Diagnosis Date  . Arthritis   . Asthma   . Bipolar 1 disorder ([place])   . Bipolar 1 disorder ([place])   . Breast cancer ([place])   . Depression   . Dysrhythmia    palpitations  . Dysrhythmia    atrial fibrillation  . Fibromyalgia   . Finger fracture 2011  . GERD (gastroesophageal reflux disease)   . Heart murmur   . History of stress test 04/28/2011   showed normal perfusion without scar or ischemia  . Hx of echocardiogram 04/28/2011   showed normal systolic function with mild diastolic dysfunction,she had trace MR but did not have frank mitral valve prolapse demonstrated. She had mild pulmonary hypertension with an estimated RV systolic pressure at 34 mm.  . Hyperlipemia   . Meniere disease   . MVP (mitral valve prolapse)   . Neuromuscular disorder ([place])    fibromyalgia    Past Surgical History:  Procedure Laterality Date  . ANKLE SURGERY     left x4  . BREAST SURGERY  2011   lumpectomy right breast   . COLONOSCOPY WITH PROPOFOL N/A 02/25/2014   Procedure: COLONOSCOPY WITH PROPOFOL;  Surgeon: Garlan Fair, MD;  [facility];  Service: Endoscopy;  Laterality: N/A;  . deviated septum    . ECTOPIC PREGNANCY SURGERY    . ELBOW SURGERY     right elbow  . EYE SURGERY      cataract surgery  . fibroid     2-3 fibroid adenomas removed  . JOINT REPLACEMENT  2011   right knee   . KNEE SURGERY     left knee  . SHOULDER ARTHROSCOPY WITH BICEPSTENOTOMY Right 09/25/2015   Procedure: SHOULDER ARTHROSCOPY WITH BICEPSTENOTOMY;  Surgeon: Ninetta Lights, MD;  Location: [place];  Service: Orthopedics;  Laterality: Right;  . SHOULDER ARTHROSCOPY WITH DISTAL CLAVICLE RESECTION Right 09/25/2015   Procedure: SHOULDER ARTHROSCOPY WITH DISTAL CLAVICLE RESECTION;  Surgeon: Ninetta Lights, MD;  Location: [place];  Service: Orthopedics;  Laterality: Right;  . SHOULDER ARTHROSCOPY WITH SUBACROMIAL DECOMPRESSION Right 09/25/2015   Procedure: RIGHT SHOULDER ARTHROSCOPY DEBRIDEMENT,ACROMIOPLASTY,DISTAL CLAVICAL EXCISION, RELEASE OF BICEPS TENDON;  Surgeon: Ninetta Lights, MD;  Location: [place];  Service: Orthopedics;  Laterality: Right;  . TMJ ARTHROPLASTY    . TONSILLECTOMY    . TOTAL SHOULDER ARTHROPLASTY Left 01/05/2016  . UTERINE FIBROID SURGERY     polups and fibroids removed   . WRIST SURGERY     left    There were no vitals filed for this visit.  Subjective Assessment - 03/14/17 1405    Subjective  I haven't been using my arm as  much due to Lt hand pain.      Pain Orientation  Left    Pain Descriptors / Indicators  Sharp;Sore    Pain Onset  More than a month ago    Pain Frequency  Constant    Aggravating Factors   overdoing     Pain Relieving Factors  exercises, needling         [facility] PT Assessment - 03/14/17 0001      AROM   Left Shoulder Internal Rotation  -- to Lt sacrum                  [facility] Adult PT Treatment/Exercise - 03/14/17 0001      Shoulder Exercises: Seated   External Rotation  --    Theraband Level (Shoulder External Rotation)  --    Flexion  -- 2# lift to 90 degrees 2 x15, then scaption x15    Abduction  Strengthening;Left;10 reps;Weights 2#      Shoulder Exercises:  Standing   Other Standing Exercises  --    Other Standing Exercises  --      Shoulder Exercises: Pulleys   Flexion  3 minutes    ABduction  3 minutes      Moist Heat Therapy   Number Minutes Moist Heat  15 Minutes    Moist Heat Location  Shoulder;Cervical post session      Manual Therapy   Manual Therapy  Soft tissue mobilization    Manual therapy comments  Manual soft tissue to anterior shoulder/pectorals and Lt upper traps       Trigger Point Dry Needling - 03/14/17 1408    Consent Given?  Yes    Muscles Treated Upper Body  Upper trapezius;Pectoralis major;Pectoralis minor Lt only    Upper Trapezius Response  Twitch reponse elicited;Palpable increased muscle length    Pectoralis Major Response  Twitch response elicited;Palpable increased muscle length    Pectoralis Minor Response  Twitch response elicited;Palpable increased muscle length             PT Short Term Goals - 03/14/17 1409      PT SHORT TERM GOAL #4   Title  report 25% less Lt shoulder pain with putting on her jacket    Status  Achieved        PT Long Term Goals - 02/07/17 1529      PT LONG TERM GOAL #1   Title  be independent in advanced HEP    Time  8    Period  Weeks    Status  New    Target Date  04/04/17      PT LONG TERM GOAL #2   Title  reduce FOTO to < or = to 38% limitation    Time  8    Period  Weeks    Status  New    Target Date  04/04/17      PT LONG TERM GOAL #3   Title  demonstarte Lt shoulder A/ROM IR to L3 to perform self-care without limitation    Time  8    Period  Weeks    Status  New    Target Date  04/04/17      PT LONG TERM GOAL #4   Title  reach out to the side with Lt UE with < or = to 4/10 Lt shoulder pain reported    Time  8    Period  Days    Status  New  Target Date  04/04/17      PT LONG TERM GOAL #5   Title  put on jacket with 50% less Lt shoulder pain    Time  8    Period  Weeks    Status  New    Target Date  04/04/17            Plan -  03/14/17 1408    Clinical Impression Statement  Pt has been limiting use of the Lt UE due to trying to rest her Lt hand.  Pt reports that Lt shoulder is feeling 60% better.  Pt with intermittent sharp/stabbing pain with use.  Pt with flare-up of fibromyalgia symptoms today so limited ability to exercise.  Pt with trigger points in Lt pectoralis and upper trap and demonstrated improved mobility and reduced pain after dry needling and manual therapy today.  Pt will continue to benefit from skilled PT for Lt shoulder strength, ROM and manual therapy for pain.      Rehab Potential  Good    PT Frequency  2x / week    PT Duration  8 weeks    PT Treatment/Interventions  ADLs/Self Care Home Management;Cryotherapy;Electrical Stimulation;Iontophoresis 4mg /ml Dexamethasone;Therapeutic activities;Neuromuscular re-education;Patient/family education;Passive range of motion;Manual techniques;Dry needling;Taping;Vasopneumatic Device    PT Next Visit Plan  Continue with gentle, progressive shoulder strength    Consulted and Agree with Plan of Care  Patient       Patient will benefit from skilled therapeutic intervention in order to improve the following deficits and impairments:  Pain, Impaired flexibility, Decreased activity tolerance, Decreased range of motion, Decreased strength, Impaired UE functional use, Postural dysfunction, Increased muscle spasms  Visit Diagnosis: Muscle weakness (generalized)  Stiffness of left shoulder, not elsewhere classified  Chronic left shoulder pain  Abnormal posture     Problem List Patient Active Problem List   Diagnosis Date Noted  . Prediabetes 10/21/2016  . Headache 10/12/2016  . 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 Chronic pain syndrome 05/27/2016  . Osteoarthritis 05/16/2016  . Seasonal allergies 05/03/2016  . History of palpitations 09/16/2015  . Bipolar 1 disorder ([place]) 09/16/2015  . History of breast cancer 11/16/2012  . Fibromyalgia 11/16/2012   . Smoker 11/16/2012  . INTERTRIGO 04/13/2010  . Hyperlipidemia 09/02/2006  . Depression 09/02/2006  . Essential hypertension 09/02/2006  . COPD (chronic obstructive pulmonary disease) ([place]) 09/02/2006  . GERD 09/02/2006    Sigurd Sos, PT 03/14/17 2:44 PM  [facility] [address] [place], [place], [address] Phone: [phone]   Fax:  [phone]  Name: Anita Arroyo MRN: [id number] Date of Birth: 20-Jan-1961

## 2017-03-16 ENCOUNTER — Ambulatory Visit: Payer: Medicare HMO | Admitting: Physical Therapy

## 2017-03-16 ENCOUNTER — Encounter: Payer: Self-pay | Admitting: Physical Therapy

## 2017-03-16 DIAGNOSIS — M25511 Pain in right shoulder: Secondary | ICD-10-CM

## 2017-03-16 DIAGNOSIS — M25611 Stiffness of right shoulder, not elsewhere classified: Secondary | ICD-10-CM | POA: Diagnosis not present

## 2017-03-16 DIAGNOSIS — M25512 Pain in left shoulder: Secondary | ICD-10-CM

## 2017-03-16 DIAGNOSIS — G8929 Other chronic pain: Secondary | ICD-10-CM

## 2017-03-16 DIAGNOSIS — M6281 Muscle weakness (generalized): Secondary | ICD-10-CM | POA: Diagnosis not present

## 2017-03-16 DIAGNOSIS — R293 Abnormal posture: Secondary | ICD-10-CM

## 2017-03-16 DIAGNOSIS — R600 Localized edema: Secondary | ICD-10-CM | POA: Diagnosis not present

## 2017-03-16 DIAGNOSIS — M25612 Stiffness of left shoulder, not elsewhere classified: Secondary | ICD-10-CM

## 2017-03-16 NOTE — Therapy (Signed)
[facility] [address], [place], [place], [address] Phone: [phone]   Fax:  [phone]  Physical Therapy Treatment  Patient Details  Name: Anita Arroyo MRN: [id number] Date of Birth: Feb 12, 1960 Referring Provider: Edmonia Lynch, MD   Encounter Date: 03/16/2017  PT End of Session - 03/16/17 1408    Visit Number  10    Date for PT Re-Evaluation  04/04/17    Authorization Type  Medicare    PT Start Time  1355 low level today    PT Stop Time  1450    PT Time Calculation (min)  55 min    Activity Tolerance  Patient tolerated treatment well    Behavior During Therapy  [facility] for tasks assessed/performed       Past Medical History:  Diagnosis Date  . Arthritis   . Asthma   . Bipolar 1 disorder ([place])   . Bipolar 1 disorder ([place])   . Breast cancer ([place])   . Depression   . Dysrhythmia    palpitations  . Dysrhythmia    atrial fibrillation  . Fibromyalgia   . Finger fracture 2011  . GERD (gastroesophageal reflux disease)   . Heart murmur   . History of stress test 04/28/2011   showed normal perfusion without scar or ischemia  . Hx of echocardiogram 04/28/2011   showed normal systolic function with mild diastolic dysfunction,she had trace MR but did not have frank mitral valve prolapse demonstrated. She had mild pulmonary hypertension with an estimated RV systolic pressure at 34 mm.  . Hyperlipemia   . Meniere disease   . MVP (mitral valve prolapse)   . Neuromuscular disorder ([place])    fibromyalgia    Past Surgical History:  Procedure Laterality Date  . ANKLE SURGERY     left x4  . BREAST SURGERY  2011   lumpectomy right breast   . COLONOSCOPY WITH PROPOFOL N/A 02/25/2014   Procedure: COLONOSCOPY WITH PROPOFOL;  Surgeon: Garlan Fair, MD;  Location: [facility];  Service: Endoscopy;  Laterality: N/A;  . deviated septum    . ECTOPIC PREGNANCY SURGERY    . ELBOW SURGERY     right elbow  . EYE SURGERY      cataract surgery  . fibroid     2-3 fibroid adenomas removed  . JOINT REPLACEMENT  2011   right knee   . KNEE SURGERY     left knee  . SHOULDER ARTHROSCOPY WITH BICEPSTENOTOMY Right 09/25/2015   Procedure: SHOULDER ARTHROSCOPY WITH BICEPSTENOTOMY;  Surgeon: Ninetta Lights, MD;  Location: [place];  Service: Orthopedics;  Laterality: Right;  . SHOULDER ARTHROSCOPY WITH DISTAL CLAVICLE RESECTION Right 09/25/2015   Procedure: SHOULDER ARTHROSCOPY WITH DISTAL CLAVICLE RESECTION;  Surgeon: Ninetta Lights, MD;  Location: [place];  Service: Orthopedics;  Laterality: Right;  . SHOULDER ARTHROSCOPY WITH SUBACROMIAL DECOMPRESSION Right 09/25/2015   Procedure: RIGHT SHOULDER ARTHROSCOPY DEBRIDEMENT,ACROMIOPLASTY,DISTAL CLAVICAL EXCISION, RELEASE OF BICEPS TENDON;  Surgeon: Ninetta Lights, MD;  Location: [facility];  Service: Orthopedics;  Laterality: Right;  . TMJ ARTHROPLASTY    . TONSILLECTOMY    . TOTAL SHOULDER ARTHROPLASTY Left 01/05/2016  . UTERINE FIBROID SURGERY     polups and fibroids removed   . WRIST SURGERY     left    There were no vitals filed for this visit.  Subjective Assessment - 03/16/17 1410    Subjective  My wrist is killing me today.  mainly on her trigger points along the Lt upper arm, and soft tissue work to the pectorals.     Rehab Potential  Good    PT Frequency  2x / week    PT Duration  8 weeks    PT Treatment/Interventions  ADLs/Self Care Home  Management;Cryotherapy;Electrical Stimulation;Iontophoresis 4mg /ml Dexamethasone;Therapeutic activities;Neuromuscular re-education;Patient/family education;Passive range of motion;Manual techniques;Dry needling;Taping;Vasopneumatic Device    PT Next Visit Plan  Continue with gentle, progressive shoulder strength    Consulted and Agree with Plan of Care  Patient       Patient will benefit from skilled therapeutic intervention in order to improve the following deficits and impairments:  Pain, Impaired flexibility, Decreased activity tolerance, Decreased range of motion, Decreased strength, Impaired UE functional use, Postural dysfunction, Increased muscle spasms  Visit Diagnosis: Muscle weakness (generalized)  Stiffness of left shoulder, not elsewhere classified  Chronic left shoulder pain  Abnormal posture  Acute pain of left shoulder  Stiffness of right shoulder, not elsewhere classified  Acute pain of right shoulder     Problem List Patient Active Problem List   Diagnosis Date Noted  . Prediabetes 10/21/2016  . Headache 10/12/2016  . 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 Chronic pain syndrome 05/27/2016  . Osteoarthritis 05/16/2016  . Seasonal allergies 05/03/2016  . History of palpitations 09/16/2015  . Bipolar 1 disorder ([place]) 09/16/2015  . History of breast cancer 11/16/2012  . Fibromyalgia 11/16/2012  . Smoker 11/16/2012  . INTERTRIGO 04/13/2010  . Hyperlipidemia 09/02/2006  . Depression 09/02/2006  . Essential hypertension 09/02/2006  . COPD (chronic obstructive pulmonary disease) ([place]) 09/02/2006  . GERD 09/02/2006    Abrey Bradway, PTA 03/16/2017, 2:39 PM  [facility] [address] [place], [place], [address] Phone: [phone]   Fax:  [phone]  Name: Anita Arroyo MRN: [id number] Date of Birth: Jan 02, 1961  [facility] [address], [place], [place], [address] Phone: [phone]   Fax:  [phone]  Physical Therapy Treatment  Patient Details  Name: Anita Arroyo MRN: [id number] Date of Birth: Feb 12, 1960 Referring Provider: Edmonia Lynch, MD   Encounter Date: 03/16/2017  PT End of Session - 03/16/17 1408    Visit Number  10    Date for PT Re-Evaluation  04/04/17    Authorization Type  Medicare    PT Start Time  1355 low level today    PT Stop Time  1450    PT Time Calculation (min)  55 min    Activity Tolerance  Patient tolerated treatment well    Behavior During Therapy  [facility] for tasks assessed/performed       Past Medical History:  Diagnosis Date  . Arthritis   . Asthma   . Bipolar 1 disorder ([place])   . Bipolar 1 disorder ([place])   . Breast cancer ([place])   . Depression   . Dysrhythmia    palpitations  . Dysrhythmia    atrial fibrillation  . Fibromyalgia   . Finger fracture 2011  . GERD (gastroesophageal reflux disease)   . Heart murmur   . History of stress test 04/28/2011   showed normal perfusion without scar or ischemia  . Hx of echocardiogram 04/28/2011   showed normal systolic function with mild diastolic dysfunction,she had trace MR but did not have frank mitral valve prolapse demonstrated. She had mild pulmonary hypertension with an estimated RV systolic pressure at 34 mm.  . Hyperlipemia   . Meniere disease   . MVP (mitral valve prolapse)   . Neuromuscular disorder ([place])    fibromyalgia    Past Surgical History:  Procedure Laterality Date  . ANKLE SURGERY     left x4  . BREAST SURGERY  2011   lumpectomy right breast   . COLONOSCOPY WITH PROPOFOL N/A 02/25/2014   Procedure: COLONOSCOPY WITH PROPOFOL;  Surgeon: Garlan Fair, MD;  [facility]: [facility];  Service: Endoscopy;  Laterality: N/A;  . deviated septum    . ECTOPIC PREGNANCY SURGERY    . ELBOW SURGERY     right elbow  . EYE SURGERY      cataract surgery  . fibroid     2-3 fibroid adenomas removed  . JOINT REPLACEMENT  2011   right knee   . KNEE SURGERY     left knee  . SHOULDER ARTHROSCOPY WITH BICEPSTENOTOMY Right 09/25/2015   Procedure: SHOULDER ARTHROSCOPY WITH BICEPSTENOTOMY;  Surgeon: Ninetta Lights, MD;  Location: [place];  Service: Orthopedics;  Laterality: Right;  . SHOULDER ARTHROSCOPY WITH DISTAL CLAVICLE RESECTION Right 09/25/2015   Procedure: SHOULDER ARTHROSCOPY WITH DISTAL CLAVICLE RESECTION;  Surgeon: Ninetta Lights, MD;  Location: [facility];  Service: Orthopedics;  Laterality: Right;  . SHOULDER ARTHROSCOPY WITH SUBACROMIAL DECOMPRESSION Right 09/25/2015   Procedure: RIGHT SHOULDER ARTHROSCOPY DEBRIDEMENT,ACROMIOPLASTY,DISTAL CLAVICAL EXCISION, RELEASE OF BICEPS TENDON;  Surgeon: Ninetta Lights, MD;  Location: [facility];  Service: Orthopedics;  Laterality: Right;  . TMJ ARTHROPLASTY    . TONSILLECTOMY    . TOTAL SHOULDER ARTHROPLASTY Left 01/05/2016  . UTERINE FIBROID SURGERY     polups and fibroids removed   . WRIST SURGERY     left    There were no vitals filed for this visit.  Subjective Assessment - 03/16/17 1410    Subjective  My wrist is killing me today.

## 2017-03-21 ENCOUNTER — Other Ambulatory Visit: Payer: Self-pay | Admitting: Physician Assistant

## 2017-03-21 DIAGNOSIS — G894 Chronic pain syndrome: Secondary | ICD-10-CM

## 2017-03-21 NOTE — Telephone Encounter (Signed)
Copied from [place]. Topic: Quick Communication - Rx Refill/Question >> Mar 21, 2017 10:05 AM Boyd Kerbs wrote:  Medication: HYDROcodone-acetaminophen (NORCO/VICODIN) 5-325 MG   pharmacy said they sent e-script to office on Thursday with no response.   She is upset, having to call several times every month to get any prescription, and she calls early as she is suppose to and calls pharmacy first.     Has the patient contacted their pharmacy? Yes.     (Agent: If no, request that the patient contact the pharmacy for the refill.)  Preferred Pharmacy (with phone number or street name): [facility] [place], [place] - [address] [place] [address] Phone: [phone] Fax: [phone]  Agent: Please be advised that RX refills may take up to 3 business days. We ask that you follow-up with your pharmacy.

## 2017-03-21 NOTE — Telephone Encounter (Signed)
LOV 01/28/17 [place]

## 2017-03-22 MED ORDER — HYDROCODONE-ACETAMINOPHEN 5-325 MG PO TABS: ORAL_TABLET | ORAL | 0 refills | Status: DC

## 2017-03-22 NOTE — Telephone Encounter (Signed)
Patient is requesting a refill of the following medications: Requested Prescriptions   Pending Prescriptions Disp Refills  . HYDROcodone-acetaminophen (NORCO/VICODIN) 5-325 MG tablet 120 tablet 0    Sig: take 1 TABLET BY MOUTH EVERY 6 HOURS AS NEEDED for moderate pain   Please Advise  Date of patient request: 03/21/17 Last office visit: 01/28/17  Date of last refill: 02/18/17 Last refill amount: #120 0RF Follow up time period per chart: 3 months, no appt scheduled at this time

## 2017-03-22 NOTE — Telephone Encounter (Signed)
Copied from [place]. Topic: Quick Communication - Rx Refill/Question >> Mar 21, 2017 10:05 AM Boyd Kerbs wrote:  Medication: HYDROcodone-acetaminophen (NORCO/VICODIN) 5-325 MG    Has the patient contacted their pharmacy? Yes.     (Agent: If no, request that the patient contact the pharmacy for the refill.)  Preferred Pharmacy (with phone number or street name): [facility]- [place], [place] - [address] [place] [address] Phone: [phone] Fax: [phone]     Agent: Please be advised that RX refills may take up to 3 business days. We ask that you follow-up with your pharmacy. >> Mar 22, 2017  4:49 PM Oliver Pila B wrote: Pt called asking about the status of the medication, pt states she is in pain and has been waiting for the medication to be filled for a while, contact pt to advise or pharmacy if needed

## 2017-03-23 ENCOUNTER — Ambulatory Visit: Payer: Medicare HMO | Admitting: Physical Therapy

## 2017-03-23 ENCOUNTER — Encounter: Payer: Self-pay | Admitting: Physical Therapy

## 2017-03-23 DIAGNOSIS — M25511 Pain in right shoulder: Secondary | ICD-10-CM

## 2017-03-23 DIAGNOSIS — M25512 Pain in left shoulder: Secondary | ICD-10-CM | POA: Diagnosis not present

## 2017-03-23 DIAGNOSIS — M6281 Muscle weakness (generalized): Secondary | ICD-10-CM

## 2017-03-23 DIAGNOSIS — M25611 Stiffness of right shoulder, not elsewhere classified: Secondary | ICD-10-CM

## 2017-03-23 DIAGNOSIS — G8929 Other chronic pain: Secondary | ICD-10-CM | POA: Diagnosis not present

## 2017-03-23 DIAGNOSIS — R293 Abnormal posture: Secondary | ICD-10-CM | POA: Diagnosis not present

## 2017-03-23 DIAGNOSIS — M25612 Stiffness of left shoulder, not elsewhere classified: Secondary | ICD-10-CM

## 2017-03-23 DIAGNOSIS — R600 Localized edema: Secondary | ICD-10-CM | POA: Diagnosis not present

## 2017-03-23 NOTE — Addendum Note (Signed)
Addended by: Addison Naegeli on: 03/23/2017 07:30 AM   Modules accepted: Orders

## 2017-03-23 NOTE — Therapy (Signed)
[facility] [address] [place], [place], [address] Phone: [phone]   Fax:  [phone]  Physical Therapy Treatment  Patient Details  Name: Anita Arroyo MRN: [id number] Date of Birth: 25-Jun-1960 Referring Provider: Margarita Rana, MD   Encounter Date: 03/23/2017  PT End of Session - 03/23/17 1528    Visit Number  11    Date for PT Re-Evaluation  04/04/17    Authorization Type  Medicare    PT Start Time  1528    PT Stop Time  1615    PT Time Calculation (min)  47 min    Activity Tolerance  Patient tolerated treatment well    Behavior During Therapy  [facility] for tasks assessed/performed       Past Medical History:  Diagnosis Date  . Arthritis   . Asthma   . Bipolar 1 disorder ([facility])   . Bipolar 1 disorder ([facility])   . Breast cancer ([facility])   . Depression   . Dysrhythmia    palpitations  . Dysrhythmia    atrial fibrillation  . Fibromyalgia   . Finger fracture 2011  . GERD (gastroesophageal reflux disease)   . Heart murmur   . History of stress test 04/28/2011   showed normal perfusion without scar or ischemia  . Hx of echocardiogram 04/28/2011   showed normal systolic function with mild diastolic dysfunction,she had trace MR but did not have frank mitral valve prolapse demonstrated. She had mild pulmonary hypertension with an estimated RV systolic pressure at 34 mm.  . Hyperlipemia   . Meniere disease   . MVP (mitral valve prolapse)   . Neuromuscular disorder ([facility])    fibromyalgia    Past Surgical History:  Procedure Laterality Date  . ANKLE SURGERY     left x4  . BREAST SURGERY  2011   lumpectomy right breast   . COLONOSCOPY WITH PROPOFOL N/A 02/25/2014   Procedure: COLONOSCOPY WITH PROPOFOL;  Surgeon: Charolett Bumpers, MD;  [facility]: [facility];  Service: Endoscopy;  Laterality: N/A;  . deviated septum    . ECTOPIC PREGNANCY SURGERY    . ELBOW SURGERY     right elbow  . EYE SURGERY     cataract  surgery  . fibroid     2-3 fibroid adenomas removed  . JOINT REPLACEMENT  2011   right knee   . KNEE SURGERY     left knee  . SHOULDER ARTHROSCOPY WITH BICEPSTENOTOMY Right 09/25/2015   Procedure: SHOULDER ARTHROSCOPY WITH BICEPSTENOTOMY;  Surgeon: Loreta Ave, MD;  Location: [facility];  Service: Orthopedics;  Laterality: Right;  . SHOULDER ARTHROSCOPY WITH DISTAL CLAVICLE RESECTION Right 09/25/2015   Procedure: SHOULDER ARTHROSCOPY WITH DISTAL CLAVICLE RESECTION;  Surgeon: Loreta Ave, MD;  Location: [facility];  Service: Orthopedics;  Laterality: Right;  . SHOULDER ARTHROSCOPY WITH SUBACROMIAL DECOMPRESSION Right 09/25/2015   Procedure: RIGHT SHOULDER ARTHROSCOPY DEBRIDEMENT,ACROMIOPLASTY,DISTAL CLAVICAL EXCISION, RELEASE OF BICEPS TENDON;  Surgeon: Loreta Ave, MD;  Location: [facility];  Service: Orthopedics;  Laterality: Right;  . TMJ ARTHROPLASTY    . TONSILLECTOMY    . TOTAL SHOULDER ARTHROPLASTY Left 01/05/2016  . UTERINE FIBROID SURGERY     polups and fibroids removed   . WRIST SURGERY     left    There were no vitals filed for this visit.  Subjective Assessment - 03/23/17 1530    Subjective  My wrist continues to hurt. Plans to call MD  about this. Whole body hurts today, this might be a fibro flare up.     Pertinent History  Rt breast cancer, Lt shoulder replacement 12/2015, Rt TKA .  Pt is hard of hearing and cries during treatment at times    Diagnostic tests  MRI of Lt shoulder: joint replacement is in tact    Patient Stated Goals  improve strength of Lt shoulder, reduce Lt shoulder pain, use Lt arm without limitation    Currently in Pain?  Yes Whole body is just aching and hurting    Aggravating Factors   Not sure, maybe fibro flare up today? Weather    Pain Relieving Factors  Dry needling, exercises    Multiple Pain Sites  No                      [facility] Adult PT Treatment/Exercise - 03/23/17 0001       Shoulder Exercises: Isometric Strengthening   Flexion  -- 5 sec 8x    Extension  -- 5 sec 8x    External Rotation  -- 5 sec 8x    Internal Rotation  -- 5 sec 8x    ABduction  -- 5 sec 8x    ADduction  -- 5 sec 8x      Manual Therapy   Manual Therapy  Soft tissue mobilization    Manual therapy comments  Manual soft tissue to anterior shoulder/pectorals and Lt upper traps     manual performed by Lorrene Reid, PT  Trigger Point Dry Needling - 03/23/17 1607    Consent Given?  Yes performed by Lorrene Reid, PT    Muscles Treated Upper Body  Pectoralis major;Pectoralis minor Lt anterior deltoid    Pectoralis Major Response  Twitch response elicited;Palpable increased muscle length    Pectoralis Minor Response  Twitch response elicited;Palpable increased muscle length             PT Short Term Goals - 03/14/17 1409      PT SHORT TERM GOAL #4   Title  report 25% less Lt shoulder pain with putting on her jacket    Status  Achieved        PT Long Term Goals - 02/07/17 1529      PT LONG TERM GOAL #1   Title  be independent in advanced HEP    Time  8    Period  Weeks    Status  New    Target Date  04/04/17      PT LONG TERM GOAL #2   Title  reduce FOTO to < or = to 38% limitation    Time  8    Period  Weeks    Status  New    Target Date  04/04/17      PT LONG TERM GOAL #3   Title  demonstarte Lt shoulder A/ROM IR to L3 to perform self-care without limitation    Time  8    Period  Weeks    Status  New    Target Date  04/04/17      PT LONG TERM GOAL #4   Title  reach out to the side with Lt UE with < or = to 4/10 Lt shoulder pain reported    Time  8    Period  Days    Status  New    Target Date  04/04/17      PT LONG TERM GOAL #5   Title  put  on jacket with 50% less Lt shoulder pain    Time  8    Period  Weeks    Status  New    Target Date  04/04/17            Plan - 03/23/17 1528    Clinical Impression Statement  Pt presents today in a  difficult situation: she might be having a flare up of her fibromyalgia as her whole body ( mostly joints) are aching and sore. She continues to complain of Lt wrist and thumb pain despoite wearing an OTC thumb spica. She requested the PT come in an perform some dry needling today., otherwise we kept the shoulder exercises gentle performing isometrics in all directions. This did not cause any pain.     Rehab Potential  Good    PT Frequency  2x / week    PT Duration  8 weeks    PT Treatment/Interventions  ADLs/Self Care Home Management;Cryotherapy;Electrical Stimulation;Iontophoresis 4mg /ml Dexamethasone;Therapeutic activities;Neuromuscular re-education;Patient/family education;Passive range of motion;Manual techniques;Dry needling;Taping;Vasopneumatic Device    PT Next Visit Plan  Continue with gentle, progressive shoulder strength    Consulted and Agree with Plan of Care  Patient       Patient will benefit from skilled therapeutic intervention in order to improve the following deficits and impairments:  Pain, Impaired flexibility, Decreased activity tolerance, Decreased range of motion, Decreased strength, Impaired UE functional use, Postural dysfunction, Increased muscle spasms  Visit Diagnosis: Muscle weakness (generalized)  Stiffness of left shoulder, not elsewhere classified  Chronic left shoulder pain  Abnormal posture  Acute pain of left shoulder  Stiffness of right shoulder, not elsewhere classified  Acute pain of right shoulder  Localized edema  Chronic right shoulder pain     Problem List Patient Active Problem List   Diagnosis Date Noted  . Prediabetes 10/21/2016  . Headache 10/12/2016  . 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 Chronic pain syndrome 05/27/2016  . Osteoarthritis 05/16/2016  . Seasonal allergies 05/03/2016  . History of palpitations 09/16/2015  . Bipolar 1 disorder ([facility]) 09/16/2015  . History of breast cancer 11/16/2012  . Fibromyalgia  11/16/2012  . Smoker 11/16/2012  . INTERTRIGO 04/13/2010  . Hyperlipidemia 09/02/2006  . Depression 09/02/2006  . Essential hypertension 09/02/2006  . COPD (chronic obstructive pulmonary disease) ([facility]) 09/02/2006  . GERD 09/02/2006    Elmina Hendel, PTA 03/23/2017, 4:11 PM Lorrene Reid, PT 03/23/17 4:13 PM  [facility] [address] [place], [place], [address] Phone: [phone]   Fax:  [phone]  Name: CARREY VITTITOE MRN: [id number] Date of Birth: 01/27/1960

## 2017-03-23 NOTE — Telephone Encounter (Signed)
LOV: 01/28/17 PCP: [place]   Hydrocodone-acetaminophen 5-325 refill Last OV: 01/28/17 Last Refill: 02/18/17 Pharmacy: [facility]

## 2017-03-25 DIAGNOSIS — M79642 Pain in left hand: Secondary | ICD-10-CM | POA: Diagnosis not present

## 2017-03-28 ENCOUNTER — Ambulatory Visit: Payer: Medicare HMO | Attending: Orthopedic Surgery

## 2017-03-28 ENCOUNTER — Other Ambulatory Visit: Payer: Self-pay

## 2017-03-28 DIAGNOSIS — M6281 Muscle weakness (generalized): Secondary | ICD-10-CM | POA: Diagnosis not present

## 2017-03-28 DIAGNOSIS — M25611 Stiffness of right shoulder, not elsewhere classified: Secondary | ICD-10-CM | POA: Diagnosis not present

## 2017-03-28 DIAGNOSIS — M25511 Pain in right shoulder: Secondary | ICD-10-CM | POA: Insufficient documentation

## 2017-03-28 DIAGNOSIS — G8929 Other chronic pain: Secondary | ICD-10-CM | POA: Diagnosis not present

## 2017-03-28 DIAGNOSIS — M542 Cervicalgia: Secondary | ICD-10-CM | POA: Insufficient documentation

## 2017-03-28 DIAGNOSIS — R252 Cramp and spasm: Secondary | ICD-10-CM | POA: Diagnosis not present

## 2017-03-28 DIAGNOSIS — M25512 Pain in left shoulder: Secondary | ICD-10-CM | POA: Insufficient documentation

## 2017-03-28 DIAGNOSIS — M25612 Stiffness of left shoulder, not elsewhere classified: Secondary | ICD-10-CM | POA: Diagnosis not present

## 2017-03-28 DIAGNOSIS — R600 Localized edema: Secondary | ICD-10-CM | POA: Insufficient documentation

## 2017-03-28 DIAGNOSIS — R293 Abnormal posture: Secondary | ICD-10-CM

## 2017-03-28 NOTE — Therapy (Signed)
[facility] [address] [place], [place], [address] Phone: [phone]   Fax:  [phone]  Physical Therapy Treatment  Patient Details  Name: Anita Arroyo MRN: [id number] Date of Birth: 06-15-1960 Referring Provider: Edmonia Lynch, MD   Encounter Date: 03/28/2017  PT End of Session - 03/28/17 1622    Visit Number  12    Date for PT Re-Evaluation  04/04/17    Authorization Type  Medicare    PT Start Time  6269    PT Stop Time  1630    PT Time Calculation (min)  59 min    Activity Tolerance  Patient tolerated treatment well    Behavior During Therapy  [facility] for tasks assessed/performed       Past Medical History:  Diagnosis Date  . Arthritis   . Asthma   . Bipolar 1 disorder ([place])   . Bipolar 1 disorder ([place])   . Breast cancer ([place])   . Depression   . Dysrhythmia    palpitations  . Dysrhythmia    atrial fibrillation  . Fibromyalgia   . Finger fracture 2011  . GERD (gastroesophageal reflux disease)   . Heart murmur   . History of stress test 04/28/2011   showed normal perfusion without scar or ischemia  . Hx of echocardiogram 04/28/2011   showed normal systolic function with mild diastolic dysfunction,she had trace MR but did not have frank mitral valve prolapse demonstrated. She had mild pulmonary hypertension with an estimated RV systolic pressure at 34 mm.  . Hyperlipemia   . Meniere disease   . MVP (mitral valve prolapse)   . Neuromuscular disorder ([place])    fibromyalgia    Past Surgical History:  Procedure Laterality Date  . ANKLE SURGERY     left x4  . BREAST SURGERY  2011   lumpectomy right breast   . COLONOSCOPY WITH PROPOFOL N/A 02/25/2014   Procedure: COLONOSCOPY WITH PROPOFOL;  Surgeon: Garlan Fair, MD;  Location: [facility];  Service: Endoscopy;  Laterality: N/A;  . deviated septum    . ECTOPIC PREGNANCY SURGERY    . ELBOW SURGERY     right elbow  . EYE SURGERY     cataract  surgery  . fibroid     2-3 fibroid adenomas removed  . JOINT REPLACEMENT  2011   right knee   . KNEE SURGERY     left knee  . SHOULDER ARTHROSCOPY WITH BICEPSTENOTOMY Right 09/25/2015   Procedure: SHOULDER ARTHROSCOPY WITH BICEPSTENOTOMY;  Surgeon: Ninetta Lights, MD;  Location: [place];  Service: Orthopedics;  Laterality: Right;  . SHOULDER ARTHROSCOPY WITH DISTAL CLAVICLE RESECTION Right 09/25/2015   Procedure: SHOULDER ARTHROSCOPY WITH DISTAL CLAVICLE RESECTION;  Surgeon: Ninetta Lights, MD;  Location: [place];  Service: Orthopedics;  Laterality: Right;  . SHOULDER ARTHROSCOPY WITH SUBACROMIAL DECOMPRESSION Right 09/25/2015   Procedure: RIGHT SHOULDER ARTHROSCOPY DEBRIDEMENT,ACROMIOPLASTY,DISTAL CLAVICAL EXCISION, RELEASE OF BICEPS TENDON;  Surgeon: Ninetta Lights, MD;  Location: [place];  Service: Orthopedics;  Laterality: Right;  . TMJ ARTHROPLASTY    . TONSILLECTOMY    . TOTAL SHOULDER ARTHROPLASTY Left 01/05/2016  . UTERINE FIBROID SURGERY     polups and fibroids removed   . WRIST SURGERY     left    There were no vitals filed for this visit.  Subjective Assessment - 03/28/17 1544    Subjective  Everything hurts.  I have been busy since the  move and have used my arms a lot.  Injection into Lt thumb.      Pertinent History  Rt breast cancer, Lt shoulder replacement 12/2015, Rt TKA .  Pt is hard of hearing and cries during treatment at times    Currently in Pain?  Yes    Pain Score  4     Pain Location  Shoulder    Pain Orientation  Left    Pain Descriptors / Indicators  Stabbing;Throbbing    Pain Type  Chronic pain    Pain Onset  More than a month ago    Pain Frequency  Constant    Aggravating Factors   reaching behind and out to the side    Pain Relieving Factors  not reaching out, rest, exercises                      [facility] Adult PT Treatment/Exercise - 03/28/17 0001      Shoulder Exercises: Seated    Flexion  -- 2# lift to 90 degrees 2 x15, then scaption x15    Abduction  Strengthening;Left;10 reps;Weights 2#      Shoulder Exercises: Pulleys   Flexion  3 minutes    ABduction  3 minutes    Other Pulley Exercises  IR x 3 minutes      Shoulder Exercises: Stretch   Corner Stretch  3 reps;20 seconds      Moist Heat Therapy   Number Minutes Moist Heat  10 Minutes    Moist Heat Location  Shoulder;Cervical post session      Manual Therapy   Manual Therapy  Soft tissue mobilization    Manual therapy comments  Manual soft tissue to anterior shoulder/pectorals and Lt upper traps       Trigger Point Dry Needling - 03/28/17 1543    Consent Given?  Yes    Muscles Treated Upper Body  Pectoralis major;Pectoralis minor;Upper trapezius Lt side     Upper Trapezius Response  Twitch reponse elicited;Palpable increased muscle length    Pectoralis Major Response  Twitch response elicited;Palpable increased muscle length    Pectoralis Minor Response  Twitch response elicited;Palpable increased muscle length             PT Short Term Goals - 03/28/17 1547      PT SHORT TERM GOAL #2   Title  demonstrate Lt shoulder A/ROM abduction with < or = to 6/10 pain to allow for reaching out to the side    Status  Achieved      PT SHORT TERM GOAL #3   Title  demonstrate Lt shoulder A/ROM IR to L5 to improve self-care without substitution    Baseline  sacrum    Time  4    Period  Weeks    Status  On-going      PT SHORT TERM GOAL #4   Title  report 25% less Lt shoulder pain with putting on her jacket    Status  Achieved        PT Long Term Goals - 03/28/17 1549      PT LONG TERM GOAL #5   Title  put on jacket with 50% less Lt shoulder pain    Status  Achieved            Plan - 03/28/17 1550    Clinical Impression Statement  Pt reports 60% reduction in Lt shoulder pain with reaching out and behind the back.  Pt with widespread pain  over the past week due to fibromyalgia flare.  Pt  with reduced flexibility in the Lt pectoralis.  IR is improved to sacrum today on the Lt.  Pt with tension and trigger points in the Lt pectoralis and upper traps and demonstrated improved mobility after dry needling and manual today.  Pt will continue to benefit from skilled PT for flexibility, strength, manual and pain management.      Rehab Potential  Good    PT Frequency  2x / week    PT Duration  8 weeks    PT Treatment/Interventions  ADLs/Self Care Home Management;Cryotherapy;Electrical Stimulation;Iontophoresis 4mg /ml Dexamethasone;Therapeutic activities;Neuromuscular re-education;Patient/family education;Passive range of motion;Manual techniques;Dry needling;Taping;Vasopneumatic Device    PT Next Visit Plan  FOTO next session.  ERO next week.      Consulted and Agree with Plan of Care  Patient       Patient will benefit from skilled therapeutic intervention in order to improve the following deficits and impairments:  Pain, Impaired flexibility, Decreased activity tolerance, Decreased range of motion, Decreased strength, Impaired UE functional use, Postural dysfunction, Increased muscle spasms  Visit Diagnosis: Muscle weakness (generalized)  Stiffness of left shoulder, not elsewhere classified  Chronic left shoulder pain  Abnormal posture     Problem List Patient Active Problem List   Diagnosis Date Noted  . Prediabetes 10/21/2016  . Headache 10/12/2016  . 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 Chronic pain syndrome 05/27/2016  . Osteoarthritis 05/16/2016  . Seasonal allergies 05/03/2016  . History of palpitations 09/16/2015  . Bipolar 1 disorder ([place]) 09/16/2015  . History of breast cancer 11/16/2012  . Fibromyalgia 11/16/2012  . Smoker 11/16/2012  . INTERTRIGO 04/13/2010  . Hyperlipidemia 09/02/2006  . Depression 09/02/2006  . Essential hypertension 09/02/2006  . COPD (chronic obstructive pulmonary disease) ([place]) 09/02/2006  . GERD 09/02/2006     Sigurd Sos, PT 03/28/17 4:25 PM  [facility] [address], [place], [place], [address] Phone: [phone]   Fax:  [phone]  Name: SANTORIA CHASON MRN: [id number] Date of Birth: 1960-08-30

## 2017-03-30 ENCOUNTER — Ambulatory Visit: Payer: Medicare HMO | Admitting: Physical Therapy

## 2017-03-30 ENCOUNTER — Encounter: Payer: Self-pay | Admitting: Physical Therapy

## 2017-03-30 DIAGNOSIS — M25611 Stiffness of right shoulder, not elsewhere classified: Secondary | ICD-10-CM | POA: Diagnosis not present

## 2017-03-30 DIAGNOSIS — M25512 Pain in left shoulder: Secondary | ICD-10-CM

## 2017-03-30 DIAGNOSIS — M25612 Stiffness of left shoulder, not elsewhere classified: Secondary | ICD-10-CM | POA: Diagnosis not present

## 2017-03-30 DIAGNOSIS — M542 Cervicalgia: Secondary | ICD-10-CM | POA: Diagnosis not present

## 2017-03-30 DIAGNOSIS — G8929 Other chronic pain: Secondary | ICD-10-CM | POA: Diagnosis not present

## 2017-03-30 DIAGNOSIS — R293 Abnormal posture: Secondary | ICD-10-CM | POA: Diagnosis not present

## 2017-03-30 DIAGNOSIS — M25511 Pain in right shoulder: Secondary | ICD-10-CM

## 2017-03-30 DIAGNOSIS — M6281 Muscle weakness (generalized): Secondary | ICD-10-CM | POA: Diagnosis not present

## 2017-03-30 DIAGNOSIS — R600 Localized edema: Secondary | ICD-10-CM

## 2017-03-30 DIAGNOSIS — R252 Cramp and spasm: Secondary | ICD-10-CM | POA: Diagnosis not present

## 2017-03-30 NOTE — Therapy (Signed)
[facility] [address] [place], [place], [address] Phone: [phone]   Fax:  [phone]  Physical Therapy Treatment  Patient Details  Name: Anita Arroyo MRN: [id number] Date of Birth: 02-09-1960 Referring Provider: Margarita Rana, MD   Encounter Date: 03/30/2017  PT End of Session - 03/30/17 1149    Visit Number  13    Date for PT Re-Evaluation  04/04/17    Authorization Type  Medicare    PT Start Time  1148    PT Stop Time  1241    PT Time Calculation (min)  53 min    Activity Tolerance  Patient tolerated treatment well    Behavior During Therapy  [facility] for tasks assessed/performed       Past Medical History:  Diagnosis Date  . Arthritis   . Asthma   . Bipolar 1 disorder ([facility])   . Bipolar 1 disorder ([facility])   . Breast cancer ([facility])   . Depression   . Dysrhythmia    palpitations  . Dysrhythmia    atrial fibrillation  . Fibromyalgia   . Finger fracture 2011  . GERD (gastroesophageal reflux disease)   . Heart murmur   . History of stress test 04/28/2011   showed normal perfusion without scar or ischemia  . Hx of echocardiogram 04/28/2011   showed normal systolic function with mild diastolic dysfunction,she had trace MR but did not have frank mitral valve prolapse demonstrated. She had mild pulmonary hypertension with an estimated RV systolic pressure at 34 mm.  . Hyperlipemia   . Meniere disease   . MVP (mitral valve prolapse)   . Neuromuscular disorder ([facility])    fibromyalgia    Past Surgical History:  Procedure Laterality Date  . ANKLE SURGERY     left x4  . BREAST SURGERY  2011   lumpectomy right breast   . COLONOSCOPY WITH PROPOFOL N/A 02/25/2014   Procedure: COLONOSCOPY WITH PROPOFOL;  Surgeon: Charolett Bumpers, MD;  Location: [facility];  Service: Endoscopy;  Laterality: N/A;  . deviated septum    . ECTOPIC PREGNANCY SURGERY    . ELBOW SURGERY     right elbow  . EYE SURGERY     cataract  surgery  . fibroid     2-3 fibroid adenomas removed  . JOINT REPLACEMENT  2011   right knee   . KNEE SURGERY     left knee  . SHOULDER ARTHROSCOPY WITH BICEPSTENOTOMY Right 09/25/2015   Procedure: SHOULDER ARTHROSCOPY WITH BICEPSTENOTOMY;  Surgeon: Loreta Ave, MD;  Location: [facility];  Service: Orthopedics;  Laterality: Right;  . SHOULDER ARTHROSCOPY WITH DISTAL CLAVICLE RESECTION Right 09/25/2015   Procedure: SHOULDER ARTHROSCOPY WITH DISTAL CLAVICLE RESECTION;  Surgeon: Loreta Ave, MD;  Location: [facility];  Service: Orthopedics;  Laterality: Right;  . SHOULDER ARTHROSCOPY WITH SUBACROMIAL DECOMPRESSION Right 09/25/2015   Procedure: RIGHT SHOULDER ARTHROSCOPY DEBRIDEMENT,ACROMIOPLASTY,DISTAL CLAVICAL EXCISION, RELEASE OF BICEPS TENDON;  Surgeon: Loreta Ave, MD;  Location: [facility];  Service: Orthopedics;  Laterality: Right;  . TMJ ARTHROPLASTY    . TONSILLECTOMY    . TOTAL SHOULDER ARTHROPLASTY Left 01/05/2016  . UTERINE FIBROID SURGERY     polups and fibroids removed   . WRIST SURGERY     left    There were no vitals filed for this visit.  Subjective Assessment - 03/30/17 1151    Subjective  Pt reports she has been sanding desk this week.  Shoulder is sore.    Diagnostic tests  MRI of Lt shoulder: joint replacement is in tact    Patient Stated Goals  improve strength of Lt shoulder, reduce Lt shoulder pain, use Lt arm without limitation    Currently in Pain?  Yes    Pain Score  4     Pain Location  Shoulder    Pain Orientation  Left    Pain Descriptors / Indicators  Sore    Multiple Pain Sites  No         [facility] PT Assessment - 03/30/17 0001      Observation/Other Assessments   Focus on Therapeutic Outcomes (FOTO)   44% limited                  [facility] Adult PT Treatment/Exercise - 03/30/17 0001      Shoulder Exercises: Supine   Horizontal ABduction  Strengthening;Both;10 reps;Theraband     Theraband Level (Shoulder Horizontal ABduction)  Level 1 (Yellow)    Internal Rotation  Strengthening;Left;20 reps;Theraband    Theraband Level (Shoulder Internal Rotation)  Level 1 (Yellow)    Other Supine Exercises  Closed chain red plyo ball circles 5x2 each direction       Shoulder Exercises: Seated   External Rotation  Strengthening;Both;10 reps;Theraband 2 sets    Theraband Level (Shoulder External Rotation)  Level 1 (Yellow)    Flexion  -- 2# lift to 90 degrees x15, then scaption x10    Abduction  Strengthening;Left;10 reps;Weights    ABduction Weight (lbs)  2      Shoulder Exercises: Pulleys   Flexion  3 minutes    ABduction  3 minutes    Other Pulley Exercises  IR behind the back 3 min TC to get pt reaching more towards the sacrum      Moist Heat Therapy   Number Minutes Moist Heat  15 Minutes    Moist Heat Location  Shoulder;Cervical post session               PT Short Term Goals - 03/28/17 1547      PT SHORT TERM GOAL #2   Title  demonstrate Lt shoulder A/ROM abduction with < or = to 6/10 pain to allow for reaching out to the side    Status  Achieved      PT SHORT TERM GOAL #3   Title  demonstrate Lt shoulder A/ROM IR to L5 to improve self-care without substitution    Baseline  sacrum    Time  4    Period  Weeks    Status  On-going      PT SHORT TERM GOAL #4   Title  report 25% less Lt shoulder pain with putting on her jacket    Status  Achieved        PT Long Term Goals - 03/28/17 1549      PT LONG TERM GOAL #5   Title  put on jacket with 50% less Lt shoulder pain    Status  Achieved            Plan - 03/30/17 1150    Clinical Impression Statement  Pt not feeling her best today and was not able to complete as much work as she did last session. Exercises with the weights were only 1 set today as pt could not complete anymore. Once we move pt supine she felt better and was able to complete more exercises.     Rehab Potential  Good    PT  Frequency  2x / week    PT Duration  8 weeks    PT Treatment/Interventions  ADLs/Self Care Home Management;Cryotherapy;Electrical Stimulation;Iontophoresis 4mg /ml Dexamethasone;Therapeutic activities;Neuromuscular re-education;Patient/family education;Passive range of motion;Manual techniques;Dry needling;Taping;Vasopneumatic Device    PT Next Visit Plan   ERO next week.      Consulted and Agree with Plan of Care  Patient       Patient will benefit from skilled therapeutic intervention in order to improve the following deficits and impairments:  Pain, Impaired flexibility, Decreased activity tolerance, Decreased range of motion, Decreased strength, Impaired UE functional use, Postural dysfunction, Increased muscle spasms  Visit Diagnosis: Muscle weakness (generalized)  Stiffness of left shoulder, not elsewhere classified  Chronic left shoulder pain  Abnormal posture  Acute pain of left shoulder  Stiffness of right shoulder, not elsewhere classified  Acute pain of right shoulder  Localized edema  Chronic right shoulder pain     Problem List Patient Active Problem List   Diagnosis Date Noted  . Prediabetes 10/21/2016  . Headache 10/12/2016  . 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 Chronic pain syndrome 05/27/2016  . Osteoarthritis 05/16/2016  . Seasonal allergies 05/03/2016  . History of palpitations 09/16/2015  . Bipolar 1 disorder ([facility]) 09/16/2015  . History of breast cancer 11/16/2012  . Fibromyalgia 11/16/2012  . Smoker 11/16/2012  . INTERTRIGO 04/13/2010  . Hyperlipidemia 09/02/2006  . Depression 09/02/2006  . Essential hypertension 09/02/2006  . COPD (chronic obstructive pulmonary disease) ([facility]) 09/02/2006  . GERD 09/02/2006    Jonaya Freshour, PTA 03/30/2017, 1:08 PM  [facility] [address] [place], [place], [address] Phone: [phone]   Fax:  [phone]  Name: Anita Arroyo MRN:  [id number] Date of Birth: Oct 14, 1960

## 2017-04-04 ENCOUNTER — Ambulatory Visit: Payer: Medicare HMO

## 2017-04-04 DIAGNOSIS — M25512 Pain in left shoulder: Secondary | ICD-10-CM

## 2017-04-04 DIAGNOSIS — M25612 Stiffness of left shoulder, not elsewhere classified: Secondary | ICD-10-CM | POA: Diagnosis not present

## 2017-04-04 DIAGNOSIS — M25611 Stiffness of right shoulder, not elsewhere classified: Secondary | ICD-10-CM | POA: Diagnosis not present

## 2017-04-04 DIAGNOSIS — M6281 Muscle weakness (generalized): Secondary | ICD-10-CM | POA: Diagnosis not present

## 2017-04-04 DIAGNOSIS — R293 Abnormal posture: Secondary | ICD-10-CM

## 2017-04-04 DIAGNOSIS — R252 Cramp and spasm: Secondary | ICD-10-CM

## 2017-04-04 DIAGNOSIS — M542 Cervicalgia: Secondary | ICD-10-CM | POA: Diagnosis not present

## 2017-04-04 DIAGNOSIS — G8929 Other chronic pain: Secondary | ICD-10-CM

## 2017-04-04 DIAGNOSIS — M19012 Primary osteoarthritis, left shoulder: Secondary | ICD-10-CM | POA: Diagnosis not present

## 2017-04-04 DIAGNOSIS — M25511 Pain in right shoulder: Secondary | ICD-10-CM

## 2017-04-04 NOTE — Patient Instructions (Addendum)
Posture - Standing   Good posture is important. Avoid slouching and forward head thrust. Maintain curve in low back and align ears over shoulders, hips over ankles.  Pull your belly button in toward your back bone. Posture Tips DO: - stand tall and erect - keep chin tucked in - keep head and shoulders in alignment - check posture regularly in mirror or large window - pull head back against headrest in car seat;  Change your position often.  Sit with lumbar support. DON'T: - slouch or slump while watching TV or reading - sit, stand or lie in one position  for too long;  Sitting is especially hard on the spine so if you sit at a desk/use the computer, then stand up often! Copyright  VHI. All rights reserved.  Posture - Sitting  Sit upright, head facing forward. Try using a roll to support lower back. Keep shoulders relaxed, and avoid rounded back. Keep hips level with knees. Avoid crossing legs for long periods. Copyright  VHI. All rights reserved.  Chronic neck strain can develop because of poor posture and faulty work habits  Postural strain related to slumped sitting and forward head posture is a leading cause of headaches, neck and upper back pain  General strengthening and flexibility exercises are helpful in the treatment of neck pain.  Most importantly, you should learn to correct the posture that may be contributing to chronic pain.   Change positions frequently  Change your work or home environment to improve posture and mechanics.   PERFORM ALL EXERCISES GENTLY AND WITH GOOD POSTURE.    20 SECOND HOLD, 3 REPS TO EACH SIDE. 4-5 TIMES EACH DAY.   AROM: Neck Rotation   Turn head slowly to look over one shoulder, then the other.   AROM: Neck Flexion   Bend head forward.   AROM: Lateral Neck Flexion   Slowly tilt head toward one shoulder, then the other.  [facility] [address] [place], [place] [address] Phone # [phone] Fax  [phone]

## 2017-04-04 NOTE — Therapy (Signed)
[facility] [address] [place], [place], [address] Phone: [phone]   Fax:  [phone]  Physical Therapy Treatment  Patient Details  Name: Anita Arroyo MRN: [id number] Date of Birth: 1960/07/28 Referring Provider: Edmonia Lynch, MD   Encounter Date: 04/04/2017  PT End of Session - 04/04/17 1453    Visit Number  14    Date for PT Re-Evaluation  05/16/17    Authorization Type  Medicare- KX at 15    PT Start Time  1402    PT Stop Time  1458    PT Time Calculation (min)  56 min    Activity Tolerance  Patient tolerated treatment well    Behavior During Therapy  [facility] for tasks assessed/performed       Past Medical History:  Diagnosis Date  . Arthritis   . Asthma   . Bipolar 1 disorder ([place])   . Bipolar 1 disorder ([place])   . Breast cancer ([place])   . Depression   . Dysrhythmia    palpitations  . Dysrhythmia    atrial fibrillation  . Fibromyalgia   . Finger fracture 2011  . GERD (gastroesophageal reflux disease)   . Heart murmur   . History of stress test 04/28/2011   showed normal perfusion without scar or ischemia  . Hx of echocardiogram 04/28/2011   showed normal systolic function with mild diastolic dysfunction,she had trace MR but did not have frank mitral valve prolapse demonstrated. She had mild pulmonary hypertension with an estimated RV systolic pressure at 34 mm.  . Hyperlipemia   . Meniere disease   . MVP (mitral valve prolapse)   . Neuromuscular disorder ([place])    fibromyalgia    Past Surgical History:  Procedure Laterality Date  . ANKLE SURGERY     left x4  . BREAST SURGERY  2011   lumpectomy right breast   . COLONOSCOPY WITH PROPOFOL N/A 02/25/2014   Procedure: COLONOSCOPY WITH PROPOFOL;  Surgeon: Garlan Fair, MD;  Location: [facility];  Service: Endoscopy;  Laterality: N/A;  . deviated septum    . ECTOPIC PREGNANCY SURGERY    . ELBOW SURGERY     right elbow  . EYE SURGERY      cataract surgery  . fibroid     2-3 fibroid adenomas removed  . JOINT REPLACEMENT  2011   right knee   . KNEE SURGERY     left knee  . SHOULDER ARTHROSCOPY WITH BICEPSTENOTOMY Right 09/25/2015   Procedure: SHOULDER ARTHROSCOPY WITH BICEPSTENOTOMY;  Surgeon: Ninetta Lights, MD;  Location: [place];  Service: Orthopedics;  Laterality: Right;  . SHOULDER ARTHROSCOPY WITH DISTAL CLAVICLE RESECTION Right 09/25/2015   Procedure: SHOULDER ARTHROSCOPY WITH DISTAL CLAVICLE RESECTION;  Surgeon: Ninetta Lights, MD;  Location: [place];  Service: Orthopedics;  Laterality: Right;  . SHOULDER ARTHROSCOPY WITH SUBACROMIAL DECOMPRESSION Right 09/25/2015   Procedure: RIGHT SHOULDER ARTHROSCOPY DEBRIDEMENT,ACROMIOPLASTY,DISTAL CLAVICAL EXCISION, RELEASE OF BICEPS TENDON;  Surgeon: Ninetta Lights, MD;  Location: [place];  Service: Orthopedics;  Laterality: Right;  . TMJ ARTHROPLASTY    . TONSILLECTOMY    . TOTAL SHOULDER ARTHROPLASTY Left 01/05/2016  . UTERINE FIBROID SURGERY     polups and fibroids removed   . WRIST SURGERY     left    There were no vitals filed for this visit.  Subjective Assessment - 04/04/17 1403    Subjective  Dr Percell Miller wrote and order for  my neck and bil shoulder.  Lt shoulder is 50% better.    Pertinent History  Rt breast cancer, Lt shoulder replacement 12/2015, Rt TKA .  Pt is hard of hearing and cries during treatment at times    Patient Stated Goals  improve strength of Lt shoulder, reduce Lt shoulder pain, use Lt arm without limitation    Currently in Pain?  Yes    Pain Score  5  Rt shoulder 4/10    Pain Location  Shoulder    Pain Orientation  Left    Pain Descriptors / Indicators  Sore;Sharp    Pain Onset  More than a month ago    Pain Frequency  Constant    Aggravating Factors   reaching out and behind (Lt), moving it (Rt    Pain Relieving Factors  not moving shoulders, heat, sunny weather    Multiple Pain Sites  Yes     Pain Score  8    Pain Location  Neck    Pain Orientation  Left;Right    Pain Descriptors / Indicators  Sore    Pain Type  Chronic pain    Pain Onset  More than a month ago    Pain Frequency  Constant    Aggravating Factors   turning head, constant    Pain Relieving Factors  not moving head, heat         [facility] PT Assessment - 04/04/17 0001      Assessment   Medical Diagnosis  shoulder weakness, bilateral shoulder and neck pain    Onset Date/Surgical Date  01/08/16 chronic neck    Hand Dominance  Right    Next MD Visit  8 weeks    Prior Therapy  for Lt shoulder      Precautions   Precautions  Other (comment) history of breast cancer      Balance Screen   Has the patient fallen in the past 6 months  No      [place] residence      Prior Function   Level of Independence  Independent    Vocation  Part time employment    Vocation Requirements  dog sitting/ walking, house sitting      Cognition   Overall Cognitive Status  Within Functional Limits for tasks assessed      Observation/Other Assessments   Focus on Therapeutic Outcomes (FOTO)   44% limited      Posture/Postural Control   Posture/Postural Control  Postural limitations      AROM   Overall AROM   Deficits    Overall AROM Comments  Rt shoulder A/ROM is full with pain at end range     AROM Assessment Site  Cervical;Shoulder    Right/Left Shoulder  Right;Left    Left Shoulder Flexion  150 Degrees    Left Shoulder ABduction  110 Degrees    Left Shoulder Internal Rotation  -- L3    Left Shoulder External Rotation  -- to T2    Cervical Flexion  42    Cervical - Right Side Bend  35    Cervical - Left Side Bend  40    Cervical - Right Rotation  50    Cervical - Left Rotation  75      Strength   Overall Strength  Deficits    Overall Strength Comments  Not able to perform MMT on Lt due to pain.   Rt shoulder is 4+/5 with  pectoralis pain reported with resisted testing       Palpation   Palpation comment  reduced PA mobility in the cervical spine with pain C4-7, tension and trigger points over bil suboccipitals, upper traps and cervical paraspinals                  [facility] Adult PT Treatment/Exercise - 04/04/17 0001      Exercises   Exercises  Neck      Shoulder Exercises: Pulleys   Flexion  3 minutes    ABduction  3 minutes      Moist Heat Therapy   Number Minutes Moist Heat  10 Minutes    Moist Heat Location  Shoulder;Cervical      Neck Exercises: Stretches   Other Neck Stretches  cervical A/ROM 3 ways 3x20 seconds             PT Education - 04/04/17 1437    Education provided  Yes    Education Details  posture and cervical A/ROM    Person(s) Educated  Patient    Methods  Explanation;Demonstration;Handout    Comprehension  Verbalized understanding;Returned demonstration       PT Short Term Goals - 03/28/17 1547      PT SHORT TERM GOAL #2   Title  demonstrate Lt shoulder A/ROM abduction with < or = to 6/10 pain to allow for reaching out to the side    Status  Achieved      PT SHORT TERM GOAL #3   Title  demonstrate Lt shoulder A/ROM IR to L5 to improve self-care without substitution    Baseline  sacrum    Time  4    Period  Weeks    Status  On-going      PT SHORT TERM GOAL #4   Title  report 25% less Lt shoulder pain with putting on her jacket    Status  Achieved        PT Long Term Goals - 04/04/17 1408      PT LONG TERM GOAL #1   Title  be independent in advanced HEP    Time  6    Period  Weeks    Status  New      PT LONG TERM GOAL #2   Title  reduce FOTO to < or = to 38% limitation    Baseline  44% limitation    Time  6    Period  Weeks    Status  On-going      PT LONG TERM GOAL #3   Title  demonstrate Lt shoulder A/ROM IR to L3 to perform self-care without limitation    Time  6    Period  Weeks    Status  Achieved      PT LONG TERM GOAL #4   Title  reach out to the side with Lt UE with < or =  to 4/10 Lt and Rt shoulder pain reported    Baseline  6/10    Time  6    Period  Weeks    Status  On-going    Target Date  05/16/17      PT LONG TERM GOAL #5   Title  report a 75% reduction in Lt shoulder pain with reaching and use    Baseline  50% reported    Time  6    Period  Weeks    Status  Achieved    Target Date  05/16/17  Additional Long Term Goals   Additional Long Term Goals  Yes      PT LONG TERM GOAL #6   Title  report a 60% reduction in neck pain with turning head with driving and ADLs    Time  6    Period  Weeks    Status  New    Target Date  05/16/17      PT LONG TERM GOAL #7   Title  demonstrate Rt cervical rotation to 70 degrees to improve rotation with driving    Time  6    Period  Weeks    Status  New    Target Date  05/16/17            Plan - 04/04/17 1430    Clinical Impression Statement  Pt reports 50% reduction in Lt shoulder pain since the start of care. Pt rates Lt shoulder pain as 4-5/10 on average and up to 6/10 with reduced frequency with reaching out and back.  Pt with Rt shoulder pain and neck pain added today.  Pt with limited Rt sided A/ROM, reduced segmental mobility and tension/trigger points in bil nec and scapular area.  FOTO is improved to 44% limitation and A/ROM of the Lt shoulder has improved since the start of care.  Pt will benefit from skilled PT for manual and dry needling to the neck, scapular strength, Lt and Rt shoulder flexibility and strength and pain modalities as needed.      Rehab Potential  Good    PT Frequency  2x / week    PT Duration  6 weeks    PT Treatment/Interventions  ADLs/Self Care Home Management;Cryotherapy;Electrical Stimulation;Iontophoresis 4mg /ml Dexamethasone;Therapeutic activities;Neuromuscular re-education;Patient/family education;Passive range of motion;Manual techniques;Dry needling;Taping;Vasopneumatic Device    PT Next Visit Plan  dry needling to neck, scapular strength, emphasis on posture,  cervical mobs    Recommended Other Services  initial certification is signed, recert sent 07/27/48    Consulted and Agree with Plan of Care  Patient       Patient will benefit from skilled therapeutic intervention in order to improve the following deficits and impairments:  Pain, Impaired flexibility, Decreased activity tolerance, Decreased range of motion, Decreased strength, Impaired UE functional use, Postural dysfunction, Increased muscle spasms  Visit Diagnosis: Muscle weakness (generalized) - Plan: PT plan of care cert/re-cert  Stiffness of left shoulder, not elsewhere classified - Plan: PT plan of care cert/re-cert  Chronic left shoulder pain - Plan: PT plan of care cert/re-cert  Abnormal posture - Plan: PT plan of care cert/re-cert  Stiffness of right shoulder, not elsewhere classified - Plan: PT plan of care cert/re-cert  Acute pain of right shoulder - Plan: PT plan of care cert/re-cert  Cervicalgia - Plan: PT plan of care cert/re-cert  Cramp and spasm - Plan: PT plan of care cert/re-cert     Problem List Patient Active Problem List   Diagnosis Date Noted  . Prediabetes 10/21/2016  . Headache 10/12/2016  . 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 Chronic pain syndrome 05/27/2016  . Osteoarthritis 05/16/2016  . Seasonal allergies 05/03/2016  . History of palpitations 09/16/2015  . Bipolar 1 disorder ([place]) 09/16/2015  . History of breast cancer 11/16/2012  . Fibromyalgia 11/16/2012  . Smoker 11/16/2012  . INTERTRIGO 04/13/2010  . Hyperlipidemia 09/02/2006  . Depression 09/02/2006  . Essential hypertension 09/02/2006  . COPD (chronic obstructive pulmonary disease) ([place]) 09/02/2006  . GERD 09/02/2006    Anita Arroyo, PT 04/04/17 2:55 PM  [facility] [address] [place], [place], [address] Phone: [phone]   Fax:  [phone]  Name: Anita Arroyo MRN: [id number] Date of Birth:  21-May-1960

## 2017-04-06 ENCOUNTER — Ambulatory Visit: Payer: Medicare HMO

## 2017-04-06 DIAGNOSIS — R252 Cramp and spasm: Secondary | ICD-10-CM | POA: Diagnosis not present

## 2017-04-06 DIAGNOSIS — R293 Abnormal posture: Secondary | ICD-10-CM | POA: Diagnosis not present

## 2017-04-06 DIAGNOSIS — M6281 Muscle weakness (generalized): Secondary | ICD-10-CM | POA: Diagnosis not present

## 2017-04-06 DIAGNOSIS — G8929 Other chronic pain: Secondary | ICD-10-CM | POA: Diagnosis not present

## 2017-04-06 DIAGNOSIS — M25611 Stiffness of right shoulder, not elsewhere classified: Secondary | ICD-10-CM | POA: Diagnosis not present

## 2017-04-06 DIAGNOSIS — M25512 Pain in left shoulder: Secondary | ICD-10-CM | POA: Diagnosis not present

## 2017-04-06 DIAGNOSIS — M542 Cervicalgia: Secondary | ICD-10-CM | POA: Diagnosis not present

## 2017-04-06 DIAGNOSIS — M25511 Pain in right shoulder: Secondary | ICD-10-CM | POA: Diagnosis not present

## 2017-04-06 DIAGNOSIS — M25612 Stiffness of left shoulder, not elsewhere classified: Secondary | ICD-10-CM | POA: Diagnosis not present

## 2017-04-06 NOTE — Therapy (Signed)
[facility] [address] [place], [place], [address] Phone: [phone]   Fax:  [phone]  Physical Therapy Treatment  Patient Details  Name: Anita Arroyo MRN: [id number] Date of Birth: 02-Nov-1960 Referring Provider: Edmonia Lynch, MD   Encounter Date: 04/06/2017  PT End of Session - 04/06/17 1620    Visit Number  15    Date for PT Re-Evaluation  05/16/17    Authorization Type  Medicare- KX at 15    PT Start Time  1532    PT Stop Time  1621    PT Time Calculation (min)  49 min    Activity Tolerance  Patient tolerated treatment well    Behavior During Therapy  [facility] for tasks assessed/performed       Past Medical History:  Diagnosis Date  . Arthritis   . Asthma   . Bipolar 1 disorder ([place])   . Bipolar 1 disorder ([place])   . Breast cancer ([place])   . Depression   . Dysrhythmia    palpitations  . Dysrhythmia    atrial fibrillation  . Fibromyalgia   . Finger fracture 2011  . GERD (gastroesophageal reflux disease)   . Heart murmur   . History of stress test 04/28/2011   showed normal perfusion without scar or ischemia  . Hx of echocardiogram 04/28/2011   showed normal systolic function with mild diastolic dysfunction,she had trace MR but did not have frank mitral valve prolapse demonstrated. She had mild pulmonary hypertension with an estimated RV systolic pressure at 34 mm.  . Hyperlipemia   . Meniere disease   . MVP (mitral valve prolapse)   . Neuromuscular disorder ([place])    fibromyalgia    Past Surgical History:  Procedure Laterality Date  . ANKLE SURGERY     left x4  . BREAST SURGERY  2011   lumpectomy right breast   . COLONOSCOPY WITH PROPOFOL N/A 02/25/2014   Procedure: COLONOSCOPY WITH PROPOFOL;  Surgeon: Garlan Fair, MD;  Location: [facility];  Service: Endoscopy;  Laterality: N/A;  . deviated septum    . ECTOPIC PREGNANCY SURGERY    . ELBOW SURGERY     right elbow  . EYE SURGERY     cataract surgery  . fibroid     2-3 fibroid adenomas removed  . JOINT REPLACEMENT  2011   right knee   . KNEE SURGERY     left knee  . SHOULDER ARTHROSCOPY WITH BICEPSTENOTOMY Right 09/25/2015   Procedure: SHOULDER ARTHROSCOPY WITH BICEPSTENOTOMY;  Surgeon: Ninetta Lights, MD;  Location: [place];  Service: Orthopedics;  Laterality: Right;  . SHOULDER ARTHROSCOPY WITH DISTAL CLAVICLE RESECTION Right 09/25/2015   Procedure: SHOULDER ARTHROSCOPY WITH DISTAL CLAVICLE RESECTION;  Surgeon: Ninetta Lights, MD;  Location: [place];  Service: Orthopedics;  Laterality: Right;  . SHOULDER ARTHROSCOPY WITH SUBACROMIAL DECOMPRESSION Right 09/25/2015   Procedure: RIGHT SHOULDER ARTHROSCOPY DEBRIDEMENT,ACROMIOPLASTY,DISTAL CLAVICAL EXCISION, RELEASE OF BICEPS TENDON;  Surgeon: Ninetta Lights, MD;  Location: [place];  Service: Orthopedics;  Laterality: Right;  . TMJ ARTHROPLASTY    . TONSILLECTOMY    . TOTAL SHOULDER ARTHROPLASTY Left 01/05/2016  . UTERINE FIBROID SURGERY     polups and fibroids removed   . WRIST SURGERY     left    There were no vitals filed for this visit.  Subjective Assessment - 04/06/17 1534    Subjective  I am stressed today.  Ready to  needle my neck today.      Pertinent History  Rt breast cancer, Lt shoulder replacement 12/2015, Rt TKA .  Pt is hard of hearing and cries during treatment at times    Currently in Pain?  Yes    Pain Score  5     Pain Location  Shoulder    Pain Score  7    Pain Location  Neck    Pain Orientation  Right;Left    Pain Descriptors / Indicators  Sore                      [facility] Adult PT Treatment/Exercise - 04/06/17 0001      Moist Heat Therapy   Number Minutes Moist Heat  12 Minutes    Moist Heat Location  Shoulder;Cervical      Manual Therapy   Manual Therapy  Soft tissue mobilization;Myofascial release    Manual therapy comments  soft tissue elongation and trigger point  release to bil upper traps, suboccipitals and paraspinals    Soft tissue mobilization  skilled palpation and assessment during dry needling       Trigger Point Dry Needling - 04/06/17 1537    Consent Given?  Yes    Muscles Treated Upper Body  Upper trapezius;Oblique capitus;Suboccipitals muscle group bil cervical multifidi    Upper Trapezius Response  Twitch reponse elicited;Palpable increased muscle length    Oblique Capitus Response  Twitch response elicited;Palpable increased muscle length    SubOccipitals Response  Twitch response elicited;Palpable increased muscle length             PT Short Term Goals - 03/28/17 1547      PT SHORT TERM GOAL #2   Title  demonstrate Lt shoulder A/ROM abduction with < or = to 6/10 pain to allow for reaching out to the side    Status  Achieved      PT SHORT TERM GOAL #3   Title  demonstrate Lt shoulder A/ROM IR to L5 to improve self-care without substitution    Baseline  sacrum    Time  4    Period  Weeks    Status  On-going      PT SHORT TERM GOAL #4   Title  report 25% less Lt shoulder pain with putting on her jacket    Status  Achieved        PT Long Term Goals - 04/04/17 1408      PT LONG TERM GOAL #1   Title  be independent in advanced HEP    Time  6    Period  Weeks    Status  New      PT LONG TERM GOAL #2   Title  reduce FOTO to < or = to 38% limitation    Baseline  44% limitation    Time  6    Period  Weeks    Status  On-going      PT LONG TERM GOAL #3   Title  demonstrate Lt shoulder A/ROM IR to L3 to perform self-care without limitation    Time  6    Period  Weeks    Status  Achieved      PT LONG TERM GOAL #4   Title  reach out to the side with Lt UE with < or = to 4/10 Lt and Rt shoulder pain reported    Baseline  6/10    Time  6    Period  Weeks    Status  On-going    Target Date  05/16/17      PT LONG TERM GOAL #5   Title  report a 75% reduction in Lt shoulder pain with reaching and use    Baseline   50% reported    Time  6    Period  Weeks    Status  Achieved    Target Date  05/16/17      Additional Long Term Goals   Additional Long Term Goals  Yes      PT LONG TERM GOAL #6   Title  report a 60% reduction in neck pain with turning head with driving and ADLs    Time  6    Period  Weeks    Status  New    Target Date  05/16/17      PT LONG TERM GOAL #7   Title  demonstrate Rt cervical rotation to 70 degrees to improve rotation with driving    Time  6    Period  Weeks    Status  New    Target Date  05/16/17            Plan - 04/06/17 1612    Clinical Impression Statement  Pt with increased stress today so session focused on manual and dry needling for soft tissue relaxation and improved ROM.  Pt with trigger points and tension in bil neck and suboccipitals.  Pt has been working on relaxation through meditation and has been stretching her neck regularly.  Pt will continue to benefit from skilled PT for postural strength, cervical flexibility and continued dry needling as helpful.      Rehab Potential  Good    PT Frequency  2x / week    PT Duration  6 weeks    PT Treatment/Interventions  ADLs/Self Care Home Management;Cryotherapy;Electrical Stimulation;Iontophoresis 4mg /ml Dexamethasone;Therapeutic activities;Neuromuscular re-education;Patient/family education;Passive range of motion;Manual techniques;Dry needling;Taping;Vasopneumatic Device    PT Next Visit Plan  dry needling to neck, scapular strength, emphasis on posture, cervical mobs    Consulted and Agree with Plan of Care  Patient       Patient will benefit from skilled therapeutic intervention in order to improve the following deficits and impairments:  Pain, Impaired flexibility, Decreased activity tolerance, Decreased range of motion, Decreased strength, Impaired UE functional use, Postural dysfunction, Increased muscle spasms  Visit Diagnosis: Abnormal posture  Cervicalgia  Cramp and spasm     Problem  List Patient Active Problem List   Diagnosis Date Noted  . Prediabetes 10/21/2016  . Headache 10/12/2016  . 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 Chronic pain syndrome 05/27/2016  . Osteoarthritis 05/16/2016  . Seasonal allergies 05/03/2016  . History of palpitations 09/16/2015  . Bipolar 1 disorder ([place]) 09/16/2015  . History of breast cancer 11/16/2012  . Fibromyalgia 11/16/2012  . Smoker 11/16/2012  . INTERTRIGO 04/13/2010  . Hyperlipidemia 09/02/2006  . Depression 09/02/2006  . Essential hypertension 09/02/2006  . COPD (chronic obstructive pulmonary disease) ([place]) 09/02/2006  . GERD 09/02/2006    Sigurd Sos, PT 04/06/17 4:21 PM  [facility] [address], [place], [place], [address] Phone: [phone]   Fax:  [phone]  Name: Anita Arroyo MRN: [id number] Date of Birth: February 22, 1960

## 2017-04-08 ENCOUNTER — Ambulatory Visit (INDEPENDENT_AMBULATORY_CARE_PROVIDER_SITE_OTHER): Payer: Medicare HMO

## 2017-04-08 ENCOUNTER — Ambulatory Visit: Payer: Medicare HMO | Admitting: Podiatry

## 2017-04-08 ENCOUNTER — Encounter: Payer: Self-pay | Admitting: Podiatry

## 2017-04-08 ENCOUNTER — Other Ambulatory Visit: Payer: Self-pay | Admitting: Physician Assistant

## 2017-04-08 DIAGNOSIS — M7752 Other enthesopathy of left foot: Secondary | ICD-10-CM | POA: Diagnosis not present

## 2017-04-08 DIAGNOSIS — L84 Corns and callosities: Secondary | ICD-10-CM

## 2017-04-08 DIAGNOSIS — M779 Enthesopathy, unspecified: Principal | ICD-10-CM

## 2017-04-08 DIAGNOSIS — L600 Ingrowing nail: Secondary | ICD-10-CM | POA: Diagnosis not present

## 2017-04-08 DIAGNOSIS — M778 Other enthesopathies, not elsewhere classified: Secondary | ICD-10-CM

## 2017-04-08 MED ORDER — TRIAMCINOLONE ACETONIDE 10 MG/ML IJ SUSP
10.0000 mg | Freq: Once | INTRAMUSCULAR | Status: AC
  Administered 2017-04-08: 10 mg

## 2017-04-08 NOTE — Progress Notes (Signed)
Subjective:   Patient ID: Anita Arroyo, female   DOB: 57 y.o.   MRN: [id number]   HPI Patient presents with inflammation and pain around the left fifth metatarsal stating that she is had ankle fusion and she walks on that side of her foot.  Patient does not smoke currently and likes to be active and does have quite a few problems with joint surfaces   ROS      Objective:  Physical Exam  Neurovascular status was found to be intact with muscle strength adequate with no range of motion of the left ankle secondary to fusion.  Patient has had knee replacement shoulder replacement and has significant keratotic lesion sub-fifth metatarsal head left very painful when pressed with fluid buildup of the plantar surface of the MPJ.  Patient did have good digital perfusion     Assessment:  Inflammatory capsulitis fifth MPJ left with keratotic lesion formation and abnormality of gait with pressure on this outside secondary to the fixed position of her ankle     Plan:  H&P x-ray condition reviewed with patient and I do think eventually that this is going to require fifth metatarsal head resection.  I advised her on tailor's bunion deformity fifth metatarsal head resection educated her on it and today I did inject the plantar capsule 3 mg Dexasone Kenalog 5 mg Xylocaine and did deep debridement of lesion will be seen back as symptomatic  X-rays indicate there is a probable enlargement around the fifth metatarsal head left

## 2017-04-08 NOTE — Patient Instructions (Signed)

## 2017-04-11 ENCOUNTER — Ambulatory Visit: Payer: Medicare HMO | Admitting: Physical Therapy

## 2017-04-11 ENCOUNTER — Encounter: Payer: Self-pay | Admitting: Physical Therapy

## 2017-04-11 ENCOUNTER — Other Ambulatory Visit: Payer: Self-pay

## 2017-04-11 ENCOUNTER — Other Ambulatory Visit (HOSPITAL_COMMUNITY): Payer: Self-pay

## 2017-04-11 DIAGNOSIS — M542 Cervicalgia: Secondary | ICD-10-CM

## 2017-04-11 DIAGNOSIS — M25512 Pain in left shoulder: Secondary | ICD-10-CM

## 2017-04-11 DIAGNOSIS — G8929 Other chronic pain: Secondary | ICD-10-CM | POA: Diagnosis not present

## 2017-04-11 DIAGNOSIS — R252 Cramp and spasm: Secondary | ICD-10-CM | POA: Diagnosis not present

## 2017-04-11 DIAGNOSIS — M25511 Pain in right shoulder: Secondary | ICD-10-CM | POA: Diagnosis not present

## 2017-04-11 DIAGNOSIS — F3162 Bipolar disorder, current episode mixed, moderate: Secondary | ICD-10-CM

## 2017-04-11 DIAGNOSIS — M25612 Stiffness of left shoulder, not elsewhere classified: Secondary | ICD-10-CM | POA: Diagnosis not present

## 2017-04-11 DIAGNOSIS — M25611 Stiffness of right shoulder, not elsewhere classified: Secondary | ICD-10-CM | POA: Diagnosis not present

## 2017-04-11 DIAGNOSIS — R293 Abnormal posture: Secondary | ICD-10-CM

## 2017-04-11 DIAGNOSIS — M6281 Muscle weakness (generalized): Secondary | ICD-10-CM | POA: Diagnosis not present

## 2017-04-11 MED ORDER — TRAZODONE HCL 100 MG PO TABS: 200.0000 mg | ORAL_TABLET | Freq: Every day | ORAL | 0 refills | Status: DC

## 2017-04-11 NOTE — Telephone Encounter (Signed)
pantoprazole refill Last OV: 01/28/17 Last Refill:01/26/17 Pharmacy:[facility]  Discussed GI issues in last office visit but did not specifically mention GERD

## 2017-04-11 NOTE — Therapy (Signed)
Date  05/16/17      Additional Long Term Goals   Additional Long Term Goals  Yes      PT LONG TERM GOAL #6   Title  report a 60% reduction in neck pain with turning head with driving and ADLs    Time  6    Period  Weeks    Status  New    Target Date  05/16/17      PT  LONG TERM GOAL #7   Title  demonstrate Rt cervical rotation to 70 degrees to improve rotation with driving    Time  6    Period  Weeks    Status  New    Target Date  05/16/17            Plan - 04/11/17 1532    Clinical Impression Statement  Pt reports having a terrible headache that she believes resulted from earlier dentist appt this morning and her allergies. Kept pt in dark treatment room helped her feel better. performed manual soft tissue work to bil cervical and upper shoulder Lt > Rt.  Spent significant time working on Lt scalene trigger point . No active exercise today due to HA.    Rehab Potential  Good    PT Frequency  2x / week    PT Duration  6 weeks    PT Treatment/Interventions  ADLs/Self Care Home Management;Cryotherapy;Electrical Stimulation;Iontophoresis 4mg /ml Dexamethasone;Therapeutic activities;Neuromuscular re-education;Patient/family education;Passive range of motion;Manual techniques;Dry needling;Taping;Vasopneumatic Device    PT Next Visit Plan  dry needling to neck, scapular strength, emphasis on posture, cervical mobs    Consulted and Agree with Plan of Care  Patient       Patient will benefit from skilled therapeutic intervention in order to improve the following deficits and impairments:  Pain, Impaired flexibility, Decreased activity tolerance, Decreased range of motion, Decreased strength, Impaired UE functional use, Postural dysfunction, Increased muscle spasms  Visit Diagnosis: Abnormal posture  Cervicalgia  Cramp and spasm  Muscle weakness (generalized)  Stiffness of left shoulder, not elsewhere classified  Chronic left shoulder pain     Problem List Patient Active Problem List   Diagnosis Date Noted  . Prediabetes 10/21/2016  . Headache 10/12/2016  . 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 Chronic pain syndrome 05/27/2016  . Osteoarthritis 05/16/2016  . Seasonal allergies 05/03/2016  . History of palpitations 09/16/2015  .  Bipolar 1 disorder ([place]) 09/16/2015  . History of breast cancer 11/16/2012  . Fibromyalgia 11/16/2012  . Smoker 11/16/2012  . INTERTRIGO 04/13/2010  . Hyperlipidemia 09/02/2006  . Depression 09/02/2006  . Essential hypertension 09/02/2006  . COPD (chronic obstructive pulmonary disease) ([place]) 09/02/2006  . GERD 09/02/2006    Charie Pinkus, PTA 04/11/2017, 4:10 PM  [facility] [address] [place], [place], [address] Phone: [phone]   Fax:  [phone]  Name: Anita Arroyo MRN: [id number] Date of Birth: 03-25-1960  [facility] [address] [place], [place], [address] Phone: [phone]   Fax:  [phone]  Physical Therapy Treatment  Patient Details  Name: Anita Arroyo MRN: [id number] Date of Birth: May 26, 1960 Referring Provider: Edmonia Lynch, MD   Encounter Date: 04/11/2017  PT End of Session - 04/11/17 1532    Visit Number  16    Date for PT Re-Evaluation  05/16/17    Authorization Type  Medicare KX now    PT Start Time  2505    PT Stop Time  1620    PT Time Calculation (min)  49 min    Activity Tolerance  Patient tolerated treatment well    Behavior During Therapy  [facility] for tasks assessed/performed       Past Medical History:  Diagnosis Date  . Arthritis   . Asthma   . Bipolar 1 disorder ([place])   . Bipolar 1 disorder ([place])   . Breast cancer ([place])   . Depression   . Dysrhythmia    palpitations  . Dysrhythmia    atrial fibrillation  . Fibromyalgia   . Finger fracture 2011  . GERD (gastroesophageal reflux disease)   . Heart murmur   . History of stress test 04/28/2011   showed normal perfusion without scar or ischemia  . Hx of echocardiogram 04/28/2011   showed normal systolic function with mild diastolic dysfunction,she had trace MR but did not have frank mitral valve prolapse demonstrated. She had mild pulmonary hypertension with an estimated RV systolic pressure at 34 mm.  . Hyperlipemia   . Meniere disease   . MVP (mitral valve prolapse)   . Neuromuscular disorder ([place])    fibromyalgia    Past Surgical History:  Procedure Laterality Date  . ANKLE SURGERY     left x4  . BREAST SURGERY  2011   lumpectomy right breast   . COLONOSCOPY WITH PROPOFOL N/A 02/25/2014   Procedure: COLONOSCOPY WITH PROPOFOL;  Surgeon: Garlan Fair, MD;  Location: [facility];  Service: Endoscopy;  Laterality: N/A;  . deviated septum    . ECTOPIC PREGNANCY SURGERY    . ELBOW SURGERY     right elbow  . EYE SURGERY     cataract surgery  . fibroid     2-3 fibroid adenomas removed  . JOINT REPLACEMENT  2011   right knee   . KNEE SURGERY     left knee  . SHOULDER ARTHROSCOPY WITH BICEPSTENOTOMY Right 09/25/2015   Procedure: SHOULDER ARTHROSCOPY WITH BICEPSTENOTOMY;  Surgeon: Ninetta Lights, MD;  Location: [place];  Service: Orthopedics;  Laterality: Right;  . SHOULDER ARTHROSCOPY WITH DISTAL CLAVICLE RESECTION Right 09/25/2015   Procedure: SHOULDER ARTHROSCOPY WITH DISTAL CLAVICLE RESECTION;  Surgeon: Ninetta Lights, MD;  Location: [place];  Service: Orthopedics;  Laterality: Right;  . SHOULDER ARTHROSCOPY WITH SUBACROMIAL DECOMPRESSION Right 09/25/2015   Procedure: RIGHT SHOULDER ARTHROSCOPY DEBRIDEMENT,ACROMIOPLASTY,DISTAL CLAVICAL EXCISION, RELEASE OF BICEPS TENDON;  Surgeon: Ninetta Lights, MD;  Location: [place];  Service: Orthopedics;  Laterality: Right;  . TMJ ARTHROPLASTY    . TONSILLECTOMY    . TOTAL SHOULDER ARTHROPLASTY Left 01/05/2016  . UTERINE FIBROID SURGERY     polups and fibroids removed   . WRIST SURGERY     left    There were no vitals filed for this visit.  Subjective Assessment - 04/11/17 1536    Subjective  Went to dentist this AM. All the drilling  [facility] [address] [place], [place], [address] Phone: [phone]   Fax:  [phone]  Physical Therapy Treatment  Patient Details  Name: Anita Arroyo MRN: [id number] Date of Birth: May 26, 1960 Referring Provider: Edmonia Lynch, MD   Encounter Date: 04/11/2017  PT End of Session - 04/11/17 1532    Visit Number  16    Date for PT Re-Evaluation  05/16/17    Authorization Type  Medicare KX now    PT Start Time  2505    PT Stop Time  1620    PT Time Calculation (min)  49 min    Activity Tolerance  Patient tolerated treatment well    Behavior During Therapy  [facility] for tasks assessed/performed       Past Medical History:  Diagnosis Date  . Arthritis   . Asthma   . Bipolar 1 disorder ([place])   . Bipolar 1 disorder ([place])   . Breast cancer ([place])   . Depression   . Dysrhythmia    palpitations  . Dysrhythmia    atrial fibrillation  . Fibromyalgia   . Finger fracture 2011  . GERD (gastroesophageal reflux disease)   . Heart murmur   . History of stress test 04/28/2011   showed normal perfusion without scar or ischemia  . Hx of echocardiogram 04/28/2011   showed normal systolic function with mild diastolic dysfunction,she had trace MR but did not have frank mitral valve prolapse demonstrated. She had mild pulmonary hypertension with an estimated RV systolic pressure at 34 mm.  . Hyperlipemia   . Meniere disease   . MVP (mitral valve prolapse)   . Neuromuscular disorder ([place])    fibromyalgia    Past Surgical History:  Procedure Laterality Date  . ANKLE SURGERY     left x4  . BREAST SURGERY  2011   lumpectomy right breast   . COLONOSCOPY WITH PROPOFOL N/A 02/25/2014   Procedure: COLONOSCOPY WITH PROPOFOL;  Surgeon: Garlan Fair, MD;  Location: [facility];  Service: Endoscopy;  Laterality: N/A;  . deviated septum    . ECTOPIC PREGNANCY SURGERY    . ELBOW SURGERY     right elbow  . EYE SURGERY     cataract surgery  . fibroid     2-3 fibroid adenomas removed  . JOINT REPLACEMENT  2011   right knee   . KNEE SURGERY     left knee  . SHOULDER ARTHROSCOPY WITH BICEPSTENOTOMY Right 09/25/2015   Procedure: SHOULDER ARTHROSCOPY WITH BICEPSTENOTOMY;  Surgeon: Ninetta Lights, MD;  Location: [place];  Service: Orthopedics;  Laterality: Right;  . SHOULDER ARTHROSCOPY WITH DISTAL CLAVICLE RESECTION Right 09/25/2015   Procedure: SHOULDER ARTHROSCOPY WITH DISTAL CLAVICLE RESECTION;  Surgeon: Ninetta Lights, MD;  Location: [place];  Service: Orthopedics;  Laterality: Right;  . SHOULDER ARTHROSCOPY WITH SUBACROMIAL DECOMPRESSION Right 09/25/2015   Procedure: RIGHT SHOULDER ARTHROSCOPY DEBRIDEMENT,ACROMIOPLASTY,DISTAL CLAVICAL EXCISION, RELEASE OF BICEPS TENDON;  Surgeon: Ninetta Lights, MD;  Location: [place];  Service: Orthopedics;  Laterality: Right;  . TMJ ARTHROPLASTY    . TONSILLECTOMY    . TOTAL SHOULDER ARTHROPLASTY Left 01/05/2016  . UTERINE FIBROID SURGERY     polups and fibroids removed   . WRIST SURGERY     left    There were no vitals filed for this visit.  Subjective Assessment - 04/11/17 1536    Subjective  Went to dentist this AM. All the drilling

## 2017-04-13 ENCOUNTER — Ambulatory Visit (HOSPITAL_COMMUNITY): Payer: Medicare HMO | Admitting: Psychiatry

## 2017-04-13 ENCOUNTER — Ambulatory Visit: Payer: Medicare HMO

## 2017-04-13 DIAGNOSIS — M6281 Muscle weakness (generalized): Secondary | ICD-10-CM | POA: Diagnosis not present

## 2017-04-13 DIAGNOSIS — M25611 Stiffness of right shoulder, not elsewhere classified: Secondary | ICD-10-CM | POA: Diagnosis not present

## 2017-04-13 DIAGNOSIS — G8929 Other chronic pain: Secondary | ICD-10-CM | POA: Diagnosis not present

## 2017-04-13 DIAGNOSIS — M25612 Stiffness of left shoulder, not elsewhere classified: Secondary | ICD-10-CM | POA: Diagnosis not present

## 2017-04-13 DIAGNOSIS — R252 Cramp and spasm: Secondary | ICD-10-CM | POA: Diagnosis not present

## 2017-04-13 DIAGNOSIS — M25511 Pain in right shoulder: Secondary | ICD-10-CM | POA: Diagnosis not present

## 2017-04-13 DIAGNOSIS — R293 Abnormal posture: Secondary | ICD-10-CM

## 2017-04-13 DIAGNOSIS — M542 Cervicalgia: Secondary | ICD-10-CM | POA: Diagnosis not present

## 2017-04-13 DIAGNOSIS — M25512 Pain in left shoulder: Secondary | ICD-10-CM | POA: Diagnosis not present

## 2017-04-13 NOTE — Therapy (Signed)
[facility] [address], [place], [place], [address] Phone: [phone]   Fax:  [phone]  Physical Therapy Treatment  Patient Details  Name: Anita Arroyo MRN: [id number] Date of Birth: 1960/09/25 Referring Provider: Edmonia Lynch, MD   Encounter Date: 04/13/2017  PT End of Session - 04/13/17 1227    Visit Number  17    Date for PT Re-Evaluation  05/16/17    Authorization Type  Medicare KX now    PT Start Time  1145    PT Stop Time  1237    PT Time Calculation (min)  52 min    Activity Tolerance  Patient tolerated treatment well    Behavior During Therapy  [facility] for tasks assessed/performed       Past Medical History:  Diagnosis Date  . Arthritis   . Asthma   . Bipolar 1 disorder ([place])   . Bipolar 1 disorder ([place])   . Breast cancer ([place])   . Depression   . Dysrhythmia    palpitations  . Dysrhythmia    atrial fibrillation  . Fibromyalgia   . Finger fracture 2011  . GERD (gastroesophageal reflux disease)   . Heart murmur   . History of stress test 04/28/2011   showed normal perfusion without scar or ischemia  . Hx of echocardiogram 04/28/2011   showed normal systolic function with mild diastolic dysfunction,she had trace MR but did not have frank mitral valve prolapse demonstrated. She had mild pulmonary hypertension with an estimated RV systolic pressure at 34 mm.  . Hyperlipemia   . Meniere disease   . MVP (mitral valve prolapse)   . Neuromuscular disorder ([place])    fibromyalgia    Past Surgical History:  Procedure Laterality Date  . ANKLE SURGERY     left x4  . BREAST SURGERY  2011   lumpectomy right breast   . COLONOSCOPY WITH PROPOFOL N/A 02/25/2014   Procedure: COLONOSCOPY WITH PROPOFOL;  Surgeon: Garlan Fair, MD;  [facility]: [facility];  Service: Endoscopy;  Laterality: N/A;  . deviated septum    . ECTOPIC PREGNANCY SURGERY    . ELBOW SURGERY     right elbow  . EYE SURGERY     cataract surgery  . fibroid     2-3 fibroid adenomas removed  . JOINT REPLACEMENT  2011   right knee   . KNEE SURGERY     left knee  . SHOULDER ARTHROSCOPY WITH BICEPSTENOTOMY Right 09/25/2015   Procedure: SHOULDER ARTHROSCOPY WITH BICEPSTENOTOMY;  Surgeon: Ninetta Lights, MD;  Location: [place];  Service: Orthopedics;  Laterality: Right;  . SHOULDER ARTHROSCOPY WITH DISTAL CLAVICLE RESECTION Right 09/25/2015   Procedure: SHOULDER ARTHROSCOPY WITH DISTAL CLAVICLE RESECTION;  Surgeon: Ninetta Lights, MD;  Location: [place];  Service: Orthopedics;  Laterality: Right;  . SHOULDER ARTHROSCOPY WITH SUBACROMIAL DECOMPRESSION Right 09/25/2015   Procedure: RIGHT SHOULDER ARTHROSCOPY DEBRIDEMENT,ACROMIOPLASTY,DISTAL CLAVICAL EXCISION, RELEASE OF BICEPS TENDON;  Surgeon: Ninetta Lights, MD;  Location: [place];  Service: Orthopedics;  Laterality: Right;  . TMJ ARTHROPLASTY    . TONSILLECTOMY    . TOTAL SHOULDER ARTHROPLASTY Left 01/05/2016  . UTERINE FIBROID SURGERY     polups and fibroids removed   . WRIST SURGERY     left    There were no vitals filed for this visit.  Subjective Assessment - 04/13/17 1149    Subjective  The dry needling helped a lot last week.  My Lt shoulder is not feeling good from sitting a long time in the waiting room with my mom.    Pertinent History  Rt breast cancer, Lt shoulder replacement 12/2015, Rt TKA .  Pt is hard of hearing and cries during treatment at times    Currently in Pain?  Yes    Pain Score  5  Rt shoulder 2/10    Pain Location  Shoulder    Pain Orientation  Left    Pain Descriptors / Indicators  Sore;Sharp    Pain Type  Chronic pain    Pain Onset  More than a month ago    Pain Frequency  Constant    Aggravating Factors   reaching out and behind    Pain Relieving Factors  not moving shoulder, heat    Pain Score  6    Pain Location  Neck    Pain Orientation  Left;Right    Pain Descriptors /  Indicators  Sore    Pain Type  Chronic pain    Pain Onset  More than a month ago    Pain Frequency  Constant    Aggravating Factors   turning head, constant    Pain Relieving Factors  dry needling, not moving heat, heat                      [facility] Adult PT Treatment/Exercise - 04/13/17 0001      Shoulder Exercises: Pulleys   Flexion  3 minutes    ABduction  3 minutes      Moist Heat Therapy   Number Minutes Moist Heat  15 Minutes    Moist Heat Location  Shoulder;Cervical      Manual Therapy   Manual Therapy  Soft tissue mobilization;Myofascial release    Manual therapy comments  soft tissue elongation and trigger point release to bil upper traps, suboccipitals and paraspinals      Neck Exercises: Stretches   Other Neck Stretches  cervical A/ROM 3 ways 3x20 seconds       Trigger Point Dry Needling - 04/13/17 1153    Consent Given?  Yes    Muscles Treated Upper Body  Upper trapezius;Oblique capitus;Suboccipitals muscle group bil cervical multifidi    Upper Trapezius Response  Twitch reponse elicited;Palpable increased muscle length    Oblique Capitus Response  Twitch response elicited;Palpable increased muscle length    SubOccipitals Response  Twitch response elicited;Palpable increased muscle length             PT Short Term Goals - 03/28/17 1547      PT SHORT TERM GOAL #2   Title  demonstrate Lt shoulder A/ROM abduction with < or = to 6/10 pain to allow for reaching out to the side    Status  Achieved      PT SHORT TERM GOAL #3   Title  demonstrate Lt shoulder A/ROM IR to L5 to improve self-care without substitution    Baseline  sacrum    Time  4    Period  Weeks    Status  On-going      PT SHORT TERM GOAL #4   Title  report 25% less Lt shoulder pain with putting on her jacket    Status  Achieved        PT Long Term Goals - 04/04/17 1408      PT LONG TERM GOAL #1   Title  be independent in advanced HEP    Time  6    Period  Weeks     Status  New      PT LONG TERM GOAL #2   Title  reduce FOTO to < or = to 38% limitation    Baseline  44% limitation    Time  6    Period  Weeks    Status  On-going      PT LONG TERM GOAL #3   Title  demonstrate Lt shoulder A/ROM IR to L3 to perform self-care without limitation    Time  6    Period  Weeks    Status  Achieved      PT LONG TERM GOAL #4   Title  reach out to the side with Lt UE with < or = to 4/10 Lt and Rt shoulder pain reported    Baseline  6/10    Time  6    Period  Weeks    Status  On-going    Target Date  05/16/17      PT LONG TERM GOAL #5   Title  report a 75% reduction in Lt shoulder pain with reaching and use    Baseline  50% reported    Time  6    Period  Weeks    Status  Achieved    Target Date  05/16/17      Additional Long Term Goals   Additional Long Term Goals  Yes      PT LONG TERM GOAL #6   Title  report a 60% reduction in neck pain with turning head with driving and ADLs    Time  6    Period  Weeks    Status  New    Target Date  05/16/17      PT LONG TERM GOAL #7   Title  demonstrate Rt cervical rotation to 70 degrees to improve rotation with driving    Time  6    Period  Weeks    Status  New    Target Date  05/16/17            Plan - 04/13/17 1154    Clinical Impression Statement  Pt reports that it is easier to sit up straight and make postural corrections at home.  Pt with tension and trigger points in bil upper traps, suboccipitals and multifidi and demonstrated improved tissue mobility after dry needling today.  Pt will continue to work on postural corrections and cervcial flexibility while out of town over the next week.  Pt will continue to benefit from skilled PT for LE strength, endurance and flexibility.      Rehab Potential  Good    PT Frequency  2x / week    PT Duration  6 weeks    PT Treatment/Interventions  ADLs/Self Care Home Management;Cryotherapy;Electrical Stimulation;Iontophoresis 4mg /ml  Dexamethasone;Therapeutic activities;Neuromuscular re-education;Patient/family education;Passive range of motion;Manual techniques;Dry needling;Taping;Vasopneumatic Device    PT Next Visit Plan  dry needling to neck, scapular strength, emphasis on posture, cervical mobs.  Add scapular strength to HEP    Consulted and Agree with Plan of Care  Patient       Patient will benefit from skilled therapeutic intervention in order to improve the following deficits and impairments:  Pain, Impaired flexibility, Decreased activity tolerance, Decreased range of motion, Decreased strength, Impaired UE functional use, Postural dysfunction, Increased muscle spasms  Visit Diagnosis: Abnormal posture  Cervicalgia  Cramp and spasm  Muscle weakness (generalized)  Stiffness of left shoulder, not elsewhere classified  Stiffness of right shoulder, not elsewhere classified     Problem List Patient Active Problem List   Diagnosis Date Noted  . Prediabetes 10/21/2016  . Headache 10/12/2016  . 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 Chronic pain syndrome 05/27/2016  . Osteoarthritis 05/16/2016  . Seasonal allergies 05/03/2016  . History of palpitations 09/16/2015  . Bipolar 1 disorder ([place]) 09/16/2015  . History of breast cancer 11/16/2012  . Fibromyalgia 11/16/2012  . Smoker 11/16/2012  . INTERTRIGO 04/13/2010  . Hyperlipidemia 09/02/2006  . Depression 09/02/2006  . Essential hypertension 09/02/2006  . COPD (chronic obstructive pulmonary disease) ([place]) 09/02/2006  . GERD 09/02/2006     Sigurd Sos, PT 04/13/17 12:29 PM  [facility] [address] [place], [place], [address] Phone: [phone]   Fax:  [phone]  Name: Anita Arroyo MRN: [id number] Date of Birth: 1960/02/20

## 2017-04-25 ENCOUNTER — Ambulatory Visit: Payer: Medicare HMO | Attending: Orthopedic Surgery

## 2017-04-25 DIAGNOSIS — R252 Cramp and spasm: Secondary | ICD-10-CM | POA: Diagnosis not present

## 2017-04-25 DIAGNOSIS — M542 Cervicalgia: Secondary | ICD-10-CM | POA: Diagnosis not present

## 2017-04-25 DIAGNOSIS — M25612 Stiffness of left shoulder, not elsewhere classified: Secondary | ICD-10-CM | POA: Diagnosis not present

## 2017-04-25 DIAGNOSIS — R293 Abnormal posture: Secondary | ICD-10-CM | POA: Diagnosis not present

## 2017-04-25 DIAGNOSIS — M25611 Stiffness of right shoulder, not elsewhere classified: Secondary | ICD-10-CM | POA: Diagnosis not present

## 2017-04-25 DIAGNOSIS — M25511 Pain in right shoulder: Secondary | ICD-10-CM | POA: Diagnosis not present

## 2017-04-25 DIAGNOSIS — M25512 Pain in left shoulder: Secondary | ICD-10-CM | POA: Diagnosis not present

## 2017-04-25 DIAGNOSIS — M6281 Muscle weakness (generalized): Secondary | ICD-10-CM | POA: Diagnosis not present

## 2017-04-25 DIAGNOSIS — G8929 Other chronic pain: Secondary | ICD-10-CM

## 2017-04-25 NOTE — Therapy (Signed)
[facility] [address], [place], [place], [address] Phone: [phone]   Fax:  [phone]  Physical Therapy Treatment  Patient Details  Name: Anita Arroyo MRN: [id number] Date of Birth: Jun 19, 1960 Referring Provider: Edmonia Lynch, MD   Encounter Date: 04/25/2017  PT End of Session - 04/25/17 1226    Visit Number  18    Date for PT Re-Evaluation  05/16/17    Authorization Type  Medicare KX now    PT Start Time  1146    PT Stop Time  1238    PT Time Calculation (min)  52 min    Activity Tolerance  Patient tolerated treatment well    Behavior During Therapy  [facility] for tasks assessed/performed       Past Medical History:  Diagnosis Date  . Arthritis   . Asthma   . Bipolar 1 disorder ([place])   . Bipolar 1 disorder ([place])   . Breast cancer ([place])   . Depression   . Dysrhythmia    palpitations  . Dysrhythmia    atrial fibrillation  . Fibromyalgia   . Finger fracture 2011  . GERD (gastroesophageal reflux disease)   . Heart murmur   . History of stress test 04/28/2011   showed normal perfusion without scar or ischemia  . Hx of echocardiogram 04/28/2011   showed normal systolic function with mild diastolic dysfunction,she had trace MR but did not have frank mitral valve prolapse demonstrated. She had mild pulmonary hypertension with an estimated RV systolic pressure at 34 mm.  . Hyperlipemia   . Meniere disease   . MVP (mitral valve prolapse)   . Neuromuscular disorder ([place])    fibromyalgia    Past Surgical History:  Procedure Laterality Date  . ANKLE SURGERY     left x4  . BREAST SURGERY  2011   lumpectomy right breast   . COLONOSCOPY WITH PROPOFOL N/A 02/25/2014   Procedure: COLONOSCOPY WITH PROPOFOL;  Surgeon: Garlan Fair, MD;  Location: [facility];  Service: Endoscopy;  Laterality: N/A;  . deviated septum    . ECTOPIC PREGNANCY SURGERY    . ELBOW SURGERY     right elbow  . EYE SURGERY     cataract surgery  . fibroid     2-3 fibroid adenomas removed  . JOINT REPLACEMENT  2011   right knee   . KNEE SURGERY     left knee  . SHOULDER ARTHROSCOPY WITH BICEPSTENOTOMY Right 09/25/2015   Procedure: SHOULDER ARTHROSCOPY WITH BICEPSTENOTOMY;  Surgeon: Ninetta Lights, MD;  Location: [place];  Service: Orthopedics;  Laterality: Right;  . SHOULDER ARTHROSCOPY WITH DISTAL CLAVICLE RESECTION Right 09/25/2015   Procedure: SHOULDER ARTHROSCOPY WITH DISTAL CLAVICLE RESECTION;  Surgeon: Ninetta Lights, MD;  Location: [place];  Service: Orthopedics;  Laterality: Right;  . SHOULDER ARTHROSCOPY WITH SUBACROMIAL DECOMPRESSION Right 09/25/2015   Procedure: RIGHT SHOULDER ARTHROSCOPY DEBRIDEMENT,ACROMIOPLASTY,DISTAL CLAVICAL EXCISION, RELEASE OF BICEPS TENDON;  Surgeon: Ninetta Lights, MD;  Location: [place];  Service: Orthopedics;  Laterality: Right;  . TMJ ARTHROPLASTY    . TONSILLECTOMY    . TOTAL SHOULDER ARTHROPLASTY Left 01/05/2016  . UTERINE FIBROID SURGERY     polups and fibroids removed   . WRIST SURGERY     left    There were no vitals filed for this visit.  Subjective Assessment - 04/25/17 1147    Subjective  My week away was really relaxing.  Pertinent History  Rt breast cancer, Lt shoulder replacement 12/2015, Rt TKA .  Pt is hard of hearing and cries during treatment at times    Diagnostic tests  MRI of Lt shoulder: joint replacement is in tact    Patient Stated Goals  improve strength of Lt shoulder, reduce Lt shoulder pain, use Lt arm without limitation    Currently in Pain?  Yes    Pain Score  4     Pain Location  Shoulder    Pain Orientation  Left    Pain Descriptors / Indicators  Sore    Pain Type  Chronic pain    Pain Onset  More than a month ago    Pain Frequency  Constant    Aggravating Factors   use of Lt arm, reaching behind back    Pain Relieving Factors  not moving shoulder, heat    Pain Score  4     Pain Location  Neck    Pain Orientation  Right;Left    Pain Descriptors / Indicators  Aching;Sore    Pain Type  Chronic pain    Pain Onset  More than a month ago    Pain Frequency  Constant    Aggravating Factors   turning head, sitting without support    Pain Relieving Factors  heat, dry needling                       [facility] Adult PT Treatment/Exercise - 04/25/17 0001      Shoulder Exercises: Seated   Flexion  AROM;AAROM;Strengthening;Both;20 reps;Weights    Flexion Weight (lbs)  2    Abduction  Strengthening;Left;10 reps;Weights;20 reps      Shoulder Exercises: Pulleys   Flexion  3 minutes    ABduction  3 minutes      Moist Heat Therapy   Number Minutes Moist Heat  15 Minutes    Moist Heat Location  Shoulder;Cervical      Manual Therapy   Manual Therapy  Soft tissue mobilization;Myofascial release    Manual therapy comments  soft tissue elongation and trigger point release to bil upper traps, suboccipitals and paraspinals       Trigger Point Dry Needling - 04/25/17 1157    Consent Given?  Yes    Muscles Treated Upper Body  Upper trapezius;Oblique capitus;Suboccipitals muscle group cervical multifidi    Upper Trapezius Response  Twitch reponse elicited;Palpable increased muscle length    Oblique Capitus Response  Twitch response elicited;Palpable increased muscle length    SubOccipitals Response  Twitch response elicited;Palpable increased muscle length             PT Short Term Goals - 03/28/17 1547      PT SHORT TERM GOAL #2   Title  demonstrate Lt shoulder A/ROM abduction with < or = to 6/10 pain to allow for reaching out to the side    Status  Achieved      PT SHORT TERM GOAL #3   Title  demonstrate Lt shoulder A/ROM IR to L5 to improve self-care without substitution    Baseline  sacrum    Time  4    Period  Weeks    Status  On-going      PT SHORT TERM GOAL #4   Title  report 25% less Lt shoulder pain with putting on her jacket     Status  Achieved        PT Long Term Goals - 04/25/17 1151  PT LONG TERM GOAL #1   Title  be independent in advanced HEP    Time  6    Period  Weeks    Status  On-going    Target Date  05/16/17      PT LONG TERM GOAL #2   Title  reduce FOTO to < or = to 38% limitation    Time  6    Period  Weeks    Status  On-going    Target Date  05/16/17      PT LONG TERM GOAL #3   Title  demonstrate Lt shoulder A/ROM IR to L3 to perform self-care without limitation    Status  Achieved      PT LONG TERM GOAL #4   Title  reach out to the side with Lt UE with < or = to 4/10 Lt and Rt shoulder pain reported    Baseline  5/10    Time  6    Period  Weeks    Status  On-going            Plan - 04/25/17 1151    Clinical Impression Statement  Pt didn't attend last week as she was out of town.  Pt rerports that she had a relaxing time so her muscles are more relaxed. Pt with tension and trigger points in bil upper traps, suboccipitals and multifidi and demonstrated improved tissue mobility after dry needling today.  Pt will continue to work on postural corrections and UE/cervical flexibility.  Pt will continue to benefit from skilled PT for LE strength, endurance and flexibility.    Rehab Potential  Good    PT Frequency  2x / week    PT Duration  6 weeks    PT Treatment/Interventions  ADLs/Self Care Home Management;Cryotherapy;Electrical Stimulation;Iontophoresis 4mg /ml Dexamethasone;Therapeutic activities;Neuromuscular re-education;Patient/family education;Passive range of motion;Manual techniques;Dry needling;Taping;Vasopneumatic Device    PT Next Visit Plan  assess response to dry needling, scapular strength, shoulder strength, add scapular strength to HEP    Recommended Other Services  cert and recert are signed    Consulted and Agree with Plan of Care  Patient       Patient will benefit from skilled therapeutic intervention in order to improve the following deficits and  impairments:  Pain, Impaired flexibility, Decreased activity tolerance, Decreased range of motion, Decreased strength, Impaired UE functional use, Postural dysfunction, Increased muscle spasms  Visit Diagnosis: Abnormal posture  Cervicalgia  Cramp and spasm  Muscle weakness (generalized)  Stiffness of left shoulder, not elsewhere classified  Stiffness of right shoulder, not elsewhere classified  Chronic left shoulder pain     Problem List Patient Active Problem List   Diagnosis Date Noted  . Prediabetes 10/21/2016  . Headache 10/12/2016  . 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 Chronic pain syndrome 05/27/2016  . Osteoarthritis 05/16/2016  . Seasonal allergies 05/03/2016  . History of palpitations 09/16/2015  . Bipolar 1 disorder ([place]) 09/16/2015  . History of breast cancer 11/16/2012  . Fibromyalgia 11/16/2012  . Smoker 11/16/2012  . INTERTRIGO 04/13/2010  . Hyperlipidemia 09/02/2006  . Depression 09/02/2006  . Essential hypertension 09/02/2006  . COPD (chronic obstructive pulmonary disease) ([place]) 09/02/2006  . GERD 09/02/2006     Sigurd Sos, PT 04/25/17 12:28 PM  [facility] [address] [place], [place], [address] Phone: [phone]   Fax:  [phone]  Name: ROLINDA IMPSON MRN: [id number] Date of Birth: Oct 18, 1960

## 2017-04-26 ENCOUNTER — Encounter: Payer: Self-pay | Admitting: Physician Assistant

## 2017-04-26 DIAGNOSIS — G894 Chronic pain syndrome: Secondary | ICD-10-CM

## 2017-04-26 MED ORDER — PANTOPRAZOLE SODIUM 40 MG PO TBEC: 40.0000 mg | DELAYED_RELEASE_TABLET | Freq: Every day | ORAL | 1 refills | Status: AC

## 2017-04-26 MED ORDER — HYDROCODONE-ACETAMINOPHEN 5-325 MG PO TABS: ORAL_TABLET | ORAL | 0 refills | Status: DC

## 2017-04-26 NOTE — Telephone Encounter (Signed)
Rx sent electronically. Patient notified via My Chart.  Meds ordered this encounter  Medications  . HYDROcodone-acetaminophen (NORCO/VICODIN) 5-325 MG tablet    Sig: take 1 TABLET BY MOUTH EVERY 6 HOURS AS NEEDED for moderate pain    Dispense:  120 tablet    Refill:  0    Order Specific Question:   Supervising Provider    Answer:   SHAW, EVA N [[id number]]  . pantoprazole (PROTONIX) 40 MG tablet    Sig: Take 1 tablet (40 mg total) by mouth daily.    Dispense:  90 tablet    Refill:  1    Order Specific Question:   Supervising Provider    Answer:   Brigitte Pulse, EVA N [[id number]]

## 2017-04-27 ENCOUNTER — Encounter: Payer: Self-pay | Admitting: Physical Therapy

## 2017-04-27 ENCOUNTER — Other Ambulatory Visit: Payer: Self-pay | Admitting: *Deleted

## 2017-04-27 ENCOUNTER — Ambulatory Visit: Payer: Medicare HMO | Admitting: Physical Therapy

## 2017-04-27 DIAGNOSIS — M25611 Stiffness of right shoulder, not elsewhere classified: Secondary | ICD-10-CM

## 2017-04-27 DIAGNOSIS — G8929 Other chronic pain: Secondary | ICD-10-CM

## 2017-04-27 DIAGNOSIS — R252 Cramp and spasm: Secondary | ICD-10-CM

## 2017-04-27 DIAGNOSIS — M25512 Pain in left shoulder: Secondary | ICD-10-CM

## 2017-04-27 DIAGNOSIS — M25511 Pain in right shoulder: Secondary | ICD-10-CM

## 2017-04-27 DIAGNOSIS — R293 Abnormal posture: Secondary | ICD-10-CM | POA: Diagnosis not present

## 2017-04-27 DIAGNOSIS — M6281 Muscle weakness (generalized): Secondary | ICD-10-CM | POA: Diagnosis not present

## 2017-04-27 DIAGNOSIS — M25612 Stiffness of left shoulder, not elsewhere classified: Secondary | ICD-10-CM | POA: Diagnosis not present

## 2017-04-27 DIAGNOSIS — M542 Cervicalgia: Secondary | ICD-10-CM | POA: Diagnosis not present

## 2017-04-27 MED ORDER — ATENOLOL 50 MG PO TABS: ORAL_TABLET | ORAL | 1 refills | Status: AC

## 2017-04-27 NOTE — Therapy (Signed)
[facility] [address] [place], [place], [address] Phone: [phone]   Fax:  [phone]  Physical Therapy Treatment  Patient Details  Name: Anita Arroyo MRN: [id number] Date of Birth: [birth year] Referring Provider: Edmonia Lynch, MD   Encounter Date: 04/27/2017  PT End of Session - 04/27/17 1145    Visit Number  19    Date for PT Re-Evaluation  05/16/17    Authorization Type  Medicare KX now    PT Start Time  1145    PT Stop Time  1245    PT Time Calculation (min)  60 min    Activity Tolerance  Patient tolerated treatment well    Behavior During Therapy  [facility] for tasks assessed/performed       Past Medical History:  Diagnosis Date  . Arthritis   . Asthma   . Bipolar 1 disorder ([place])   . Bipolar 1 disorder ([place])   . Breast cancer ([place])   . Depression   . Dysrhythmia    palpitations  . Dysrhythmia    atrial fibrillation  . Fibromyalgia   . Finger fracture 2011  . GERD (gastroesophageal reflux disease)   . Heart murmur   . History of stress test 04/28/2011   showed normal perfusion without scar or ischemia  . Hx of echocardiogram 04/28/2011   showed normal systolic function with mild diastolic dysfunction,she had trace MR but did not have frank mitral valve prolapse demonstrated. She had mild pulmonary hypertension with an estimated RV systolic pressure at 34 mm.  . Hyperlipemia   . Meniere disease   . MVP (mitral valve prolapse)   . Neuromuscular disorder ([place])    fibromyalgia    Past Surgical History:  Procedure Laterality Date  . ANKLE SURGERY     left x4  . BREAST SURGERY  2011   lumpectomy right breast   . COLONOSCOPY WITH PROPOFOL N/A 02/25/2014   Procedure: COLONOSCOPY WITH PROPOFOL;  Surgeon: Garlan Fair, MD;  [facility]: [facility];  Service: Endoscopy;  Laterality: N/A;  . deviated septum    . ECTOPIC PREGNANCY SURGERY    . ELBOW SURGERY     right elbow  . EYE SURGERY      cataract surgery  . fibroid     2-3 fibroid adenomas removed  . JOINT REPLACEMENT  2011   right knee   . KNEE SURGERY     left knee  . SHOULDER ARTHROSCOPY WITH BICEPSTENOTOMY Right 09/25/2015   Procedure: SHOULDER ARTHROSCOPY WITH BICEPSTENOTOMY;  Surgeon: Ninetta Lights, MD;  Location: [place];  Service: Orthopedics;  Laterality: Right;  . SHOULDER ARTHROSCOPY WITH DISTAL CLAVICLE RESECTION Right 09/25/2015   Procedure: SHOULDER ARTHROSCOPY WITH DISTAL CLAVICLE RESECTION;  Surgeon: Ninetta Lights, MD;  Location: [place];  Service: Orthopedics;  Laterality: Right;  . SHOULDER ARTHROSCOPY WITH SUBACROMIAL DECOMPRESSION Right 09/25/2015   Procedure: RIGHT SHOULDER ARTHROSCOPY DEBRIDEMENT,ACROMIOPLASTY,DISTAL CLAVICAL EXCISION, RELEASE OF BICEPS TENDON;  Surgeon: Ninetta Lights, MD;  Location: [place];  Service: Orthopedics;  Laterality: Right;  . TMJ ARTHROPLASTY    . TONSILLECTOMY    . TOTAL SHOULDER ARTHROPLASTY Left 01/05/2016  . UTERINE FIBROID SURGERY     polups and fibroids removed   . WRIST SURGERY     left    There were no vitals filed for this visit.  Subjective Assessment - 04/27/17 1148    Subjective  My shoulder is hurting today.  Pertinent History  Rt breast cancer, Lt shoulder replacement 12/2015, Rt TKA .  Pt is hard of hearing and cries during treatment at times    Diagnostic tests  MRI of Lt shoulder: joint replacement is in tact    Patient Stated Goals  improve strength of Lt shoulder, reduce Lt shoulder pain, use Lt arm without limitation    Currently in Pain?  Yes    Pain Score  6     Pain Location  Shoulder    Pain Orientation  Left    Pain Descriptors / Indicators  Sore    Multiple Pain Sites  No                       [facility] Adult PT Treatment/Exercise - 04/27/17 0001      Neck Exercises: Supine   Other Supine Exercise  Neck rolls on foam roll x 3 min, rolls bil in rotation 20x       Shoulder Exercises: Supine   Horizontal ABduction  Strengthening;Both;20 reps;Theraband    Theraband Level (Shoulder Horizontal ABduction)  Level 2 (Red)    External Rotation  Strengthening;Both;20 reps;Theraband neck on soft foam roll    Theraband Level (Shoulder External Rotation)  Level 2 (Red)    Internal Rotation  Strengthening;Left;20 reps;Theraband    Theraband Level (Shoulder Internal Rotation)  Level 2 (Red)    Flexion  Strengthening;Both;20 reps;Weights    Shoulder Flexion Weight (lbs)  2    Other Supine Exercises  Scaption red band  head on soft roll : 2x10      Moist Heat Therapy   Number Minutes Moist Heat  15 Minutes    Moist Heat Location  Shoulder;Cervical             PT Education - 04/27/17 1221    Education provided  Yes    Education Details  Soft foam roll repertoirefor HEP, long term pain management    Person(s) Educated  Patient    Methods  Explanation;Demonstration;Handout    Comprehension  Verbalized understanding;Returned demonstration       PT Short Term Goals - 03/28/17 1547      PT SHORT TERM GOAL #2   Title  demonstrate Lt shoulder A/ROM abduction with < or = to 6/10 pain to allow for reaching out to the side    Status  Achieved      PT SHORT TERM GOAL #3   Title  demonstrate Lt shoulder A/ROM IR to L5 to improve self-care without substitution    Baseline  sacrum    Time  4    Period  Weeks    Status  On-going      PT SHORT TERM GOAL #4   Title  report 25% less Lt shoulder pain with putting on her jacket    Status  Achieved        PT Long Term Goals - 04/25/17 1151      PT LONG TERM GOAL #1   Title  be independent in advanced HEP    Time  6    Period  Weeks    Status  On-going    Target Date  05/16/17      PT LONG TERM GOAL #2   Title  reduce FOTO to < or = to 38% limitation    Time  6    Period  Weeks    Status  On-going    Target Date  05/16/17  PT LONG TERM GOAL #3   Title  demonstrate Lt shoulder A/ROM IR to L3  to perform self-care without limitation    Status  Achieved      PT LONG TERM GOAL #4   Title  reach out to the side with Lt UE with < or = to 4/10 Lt and Rt shoulder pain reported    Baseline  5/10    Time  6    Period  Weeks    Status  On-going            Plan - 04/27/17 1145    Clinical Impression Statement  Pt was introduced to the soft foam roll today. She performed shoulder strengthening and cervical myofascial techniques. She was shown how to address her other areas of complaints so she could address her multiple needs. She was shown where to buy and given a coupon  to [employer]. There was no complaints of pain with her exercises.     Rehab Potential  Good    PT Frequency  2x / week    PT Duration  6 weeks    PT Treatment/Interventions  ADLs/Self Care Home Management;Cryotherapy;Electrical Stimulation;Iontophoresis 4mg /ml Dexamethasone;Therapeutic activities;Neuromuscular re-education;Patient/family education;Passive range of motion;Manual techniques;Dry needling;Taping;Vasopneumatic Device    PT Next Visit Plan   scapular strength, shoulder strength, add scapular strength to HEP, see if pt got roll for home?    Consulted and Agree with Plan of Care  Patient       Patient will benefit from skilled therapeutic intervention in order to improve the following deficits and impairments:  Pain, Impaired flexibility, Decreased activity tolerance, Decreased range of motion, Decreased strength, Impaired UE functional use, Postural dysfunction, Increased muscle spasms  Visit Diagnosis: Abnormal posture  Cervicalgia  Cramp and spasm  Muscle weakness (generalized)  Stiffness of left shoulder, not elsewhere classified  Stiffness of right shoulder, not elsewhere classified  Chronic left shoulder pain  Acute pain of right shoulder  Acute pain of left shoulder     Problem List Patient Active Problem List   Diagnosis Date Noted  . Prediabetes 10/21/2016   . Headache 10/12/2016  . 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 Chronic pain syndrome 05/27/2016  . Osteoarthritis 05/16/2016  . Seasonal allergies 05/03/2016  . History of palpitations 09/16/2015  . Bipolar 1 disorder ([place]) 09/16/2015  . History of breast cancer 11/16/2012  . Fibromyalgia 11/16/2012  . Smoker 11/16/2012  . INTERTRIGO 04/13/2010  . Hyperlipidemia 09/02/2006  . Depression 09/02/2006  . Essential hypertension 09/02/2006  . COPD (chronic obstructive pulmonary disease) ([place]) 09/02/2006  . GERD 09/02/2006    COCHRAN,JENNIFER, PTA 04/27/2017, 12:36 PM  [facility] [address], [place], [place], [address] Phone: [phone]   Fax:  [phone]  Name: SHANDON MATSON MRN: [id number] Date of Birth: Jun 04, 1960

## 2017-04-29 ENCOUNTER — Other Ambulatory Visit (HOSPITAL_COMMUNITY): Payer: Self-pay

## 2017-04-29 DIAGNOSIS — F3162 Bipolar disorder, current episode mixed, moderate: Secondary | ICD-10-CM

## 2017-04-29 MED ORDER — VENLAFAXINE HCL ER 150 MG PO CP24: 150.0000 mg | ORAL_CAPSULE | Freq: Every day | ORAL | 0 refills | Status: DC

## 2017-04-29 MED ORDER — LAMOTRIGINE 150 MG PO TABS: 150.0000 mg | ORAL_TABLET | Freq: Two times a day (BID) | ORAL | 0 refills | Status: DC

## 2017-05-02 ENCOUNTER — Encounter: Payer: Self-pay | Admitting: Physician Assistant

## 2017-05-02 ENCOUNTER — Encounter: Payer: Self-pay | Admitting: Physical Therapy

## 2017-05-09 ENCOUNTER — Encounter: Payer: Self-pay | Admitting: Physical Therapy

## 2017-05-09 ENCOUNTER — Ambulatory Visit: Payer: Medicare HMO | Admitting: Physical Therapy

## 2017-05-09 DIAGNOSIS — M25511 Pain in right shoulder: Secondary | ICD-10-CM

## 2017-05-09 DIAGNOSIS — R252 Cramp and spasm: Secondary | ICD-10-CM

## 2017-05-09 DIAGNOSIS — M6281 Muscle weakness (generalized): Secondary | ICD-10-CM | POA: Diagnosis not present

## 2017-05-09 DIAGNOSIS — R293 Abnormal posture: Secondary | ICD-10-CM

## 2017-05-09 DIAGNOSIS — G8929 Other chronic pain: Secondary | ICD-10-CM | POA: Diagnosis not present

## 2017-05-09 DIAGNOSIS — M25611 Stiffness of right shoulder, not elsewhere classified: Secondary | ICD-10-CM | POA: Diagnosis not present

## 2017-05-09 DIAGNOSIS — M25612 Stiffness of left shoulder, not elsewhere classified: Secondary | ICD-10-CM | POA: Diagnosis not present

## 2017-05-09 DIAGNOSIS — M542 Cervicalgia: Secondary | ICD-10-CM

## 2017-05-09 DIAGNOSIS — M25512 Pain in left shoulder: Secondary | ICD-10-CM

## 2017-05-09 NOTE — Therapy (Addendum)
[facility] [address], [place], [place], [address] Phone: [phone]   Fax:  [phone]  Physical Therapy Treatment  Patient Details  Name: Anita Arroyo MRN: [id number] Date of Birth: [birth year] Referring Provider: Edmonia Lynch, MD   Encounter Date: 05/09/2017 Progress Note Reporting Period 03/23/17 to 05/09/17  See note below for Objective Data and Assessment of Progress/Goals.  Pt has made slow progress due to chronic pain condition.  Pt missed appointments last week due to conflict with her work.  Pt was in flare up of pain today.  Pt has over all pain reduction and is able to participate in advanced exercise and improved use of Lt UE with self-care and ADLs.  Pt has responded well to dry needling to the cervical region with reduced pan and reduced muscle tension.  Pt is expected to make continued yet slow progress due to chronic pain condition.    Sigurd Sos, PT 05/09/17 3:11 PM    PT End of Session - 05/09/17 1147    Visit Number  20    Date for PT Re-Evaluation  05/16/17    Authorization Type  Medicare KX now    PT Start Time  1146    PT Stop Time  1246    PT Time Calculation (min)  60 min    Activity Tolerance  Patient tolerated treatment well    Behavior During Therapy  [facility] for tasks assessed/performed       Past Medical History:  Diagnosis Date  . Arthritis   . Asthma   . Bipolar 1 disorder ([place])   . Bipolar 1 disorder ([place])   . Breast cancer ([place])   . Depression   . Dysrhythmia    palpitations  . Dysrhythmia    atrial fibrillation  . Fibromyalgia   . Finger fracture 2011  . GERD (gastroesophageal reflux disease)   . Heart murmur   . History of stress test 04/28/2011   showed normal perfusion without scar or ischemia  . Hx of echocardiogram 04/28/2011   showed normal systolic function with mild diastolic dysfunction,she had trace MR but did not have frank mitral valve prolapse  demonstrated. She had mild pulmonary hypertension with an estimated RV systolic pressure at 34 mm.  . Hyperlipemia   . Meniere disease   . MVP (mitral valve prolapse)   . Neuromuscular disorder ([place])    fibromyalgia    Past Surgical History:  Procedure Laterality Date  . ANKLE SURGERY     left x4  . BREAST SURGERY  2011   lumpectomy right breast   . COLONOSCOPY WITH PROPOFOL N/A 02/25/2014   Procedure: COLONOSCOPY WITH PROPOFOL;  Surgeon: Garlan Fair, MD;  Location: [facility];  Service: Endoscopy;  Laterality: N/A;  . deviated septum    . ECTOPIC PREGNANCY SURGERY    . ELBOW SURGERY     right elbow  . EYE SURGERY     cataract surgery  . fibroid     2-3 fibroid adenomas removed  . JOINT REPLACEMENT  2011   right knee   . KNEE SURGERY     left knee  . SHOULDER ARTHROSCOPY WITH BICEPSTENOTOMY Right 09/25/2015   Procedure: SHOULDER ARTHROSCOPY WITH BICEPSTENOTOMY;  Surgeon: Ninetta Lights, MD;  Location: [place];  Service: Orthopedics;  Laterality: Right;  . SHOULDER ARTHROSCOPY WITH DISTAL CLAVICLE RESECTION Right 09/25/2015   Procedure: SHOULDER ARTHROSCOPY WITH DISTAL CLAVICLE RESECTION;  Surgeon: Ninetta Lights, MD;  Location:  [place];  Service: Orthopedics;  Laterality: Right;  . SHOULDER ARTHROSCOPY WITH SUBACROMIAL DECOMPRESSION Right 09/25/2015   Procedure: RIGHT SHOULDER ARTHROSCOPY DEBRIDEMENT,ACROMIOPLASTY,DISTAL CLAVICAL EXCISION, RELEASE OF BICEPS TENDON;  Surgeon: Ninetta Lights, MD;  Location: [place];  Service: Orthopedics;  Laterality: Right;  . TMJ ARTHROPLASTY    . TONSILLECTOMY    . TOTAL SHOULDER ARTHROPLASTY Left 01/05/2016  . UTERINE FIBROID SURGERY     polups and fibroids removed   . WRIST SURGERY     left    There were no vitals filed for this visit.  Subjective Assessment - 05/09/17 1205    Subjective  I hurt all over today. I bought a foam roll and want to go over how to use it today.     Pertinent History  Rt breast cancer, Lt shoulder replacement 12/2015, Rt TKA .  Pt is hard of hearing and cries during treatment at times    Diagnostic tests  MRI of Lt shoulder: joint replacement is in tact    Patient Stated Goals  improve strength of Lt shoulder, reduce Lt shoulder pain, use Lt arm without limitation    Currently in Pain?  Yes All over, whole body    Pain Score  8     Aggravating Factors   ?    Pain Relieving Factors  rest         [facility] PT Assessment - 05/09/17 0001      AROM   Left Shoulder ABduction  130 Degrees    Left Shoulder Internal Rotation  -- L3                   [facility] Adult PT Treatment/Exercise - 05/09/17 0001      Moist Heat Therapy   Moist Heat Location  Shoulder;Cervical             PT Education - 05/09/17 1208    Education provided  Yes    Education Details  Review of foam roll exercises for shoulder and neck pain/ROM    Person(s) Educated  Patient    Methods  Explanation;Demonstration;Tactile cues;Verbal cues Pt bought a book, made notations in it for her    Comprehension  Verbalized understanding;Returned demonstration       PT Short Term Goals - 05/09/17 1402      PT SHORT TERM GOAL #2   Title  demonstrate Lt shoulder A/ROM abduction with < or = to 6/10 pain to allow for reaching out to the side    Time  4    Period  Weeks    Status  Achieved      PT SHORT TERM GOAL #3   Title  demonstrate Lt shoulder A/ROM IR to L5 to improve self-care without substitution    Time  4    Period  Weeks    Status  Achieved      PT SHORT TERM GOAL #4   Title  report 25% less Lt shoulder pain with putting on her jacket    Time  4    Period  Weeks    Status  Achieved        PT Long Term Goals - 05/09/17 1402      PT LONG TERM GOAL #1   Title  be independent in advanced HEP    Time  6    Period  Weeks    Status  On-going      PT LONG TERM GOAL #3  Title  demonstrate Lt shoulder A/ROM IR to L3 to perform self-care  without limitation    Time  6    Period  Weeks    Status  Achieved      PT LONG TERM GOAL #4   Title  reach out to the side with Lt UE with < or = to 4/10 Lt and Rt shoulder pain reported    Time  6    Period  Weeks    Status  On-going 5/10      PT LONG TERM GOAL #5   Title  report a 75% reduction in Lt shoulder pain with reaching and use    Time  6    Period  Weeks    Status  Achieved            Plan - 05/09/17 1221    Clinical Impression Statement  Pt came into her session feeling down from her stresful week, she was upset she had to miss her PT and lastly she hurt allover. This could be fibro related or from the recent weather changes.  She bought a foam roll last week including a book with instructions. Pt was educated in how to perform different activites with the roll and yoga strap for shoulder /neck pain and ROM.      Rehab Potential  Good    PT Frequency  2x / week    PT Duration  6 weeks    PT Treatment/Interventions  ADLs/Self Care Home Management;Cryotherapy;Electrical Stimulation;Iontophoresis 4mg /ml Dexamethasone;Therapeutic activities;Neuromuscular re-education;Patient/family education;Passive range of motion;Manual techniques;Dry needling;Taping;Vasopneumatic Device    PT Next Visit Plan  DN next visit    Consulted and Agree with Plan of Care  Patient       Patient will benefit from skilled therapeutic intervention in order to improve the following deficits and impairments:  Pain, Impaired flexibility, Decreased activity tolerance, Decreased range of motion, Decreased strength, Impaired UE functional use, Postural dysfunction, Increased muscle spasms  Visit Diagnosis: Abnormal posture  Cervicalgia  Cramp and spasm  Muscle weakness (generalized)  Stiffness of left shoulder, not elsewhere classified  Stiffness of right shoulder, not elsewhere classified  Chronic left shoulder pain  Acute pain of right shoulder  Acute pain of left  shoulder     Problem List Patient Active Problem List   Diagnosis Date Noted  . Prediabetes 10/21/2016  . Headache 10/12/2016  . 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 Chronic pain syndrome 05/27/2016  . Osteoarthritis 05/16/2016  . Seasonal allergies 05/03/2016  . History of palpitations 09/16/2015  . Bipolar 1 disorder ([place]) 09/16/2015  . History of breast cancer 11/16/2012  . Fibromyalgia 11/16/2012  . Smoker 11/16/2012  . INTERTRIGO 04/13/2010  . Hyperlipidemia 09/02/2006  . Depression 09/02/2006  . Essential hypertension 09/02/2006  . COPD (chronic obstructive pulmonary disease) ([place]) 09/02/2006  . GERD 09/02/2006      Myrene Galas, PTA 05/09/17 2:47 PM  [facility] [address], [place], [place], [address] Phone: [phone]   Fax:  [phone]  Name: Anita Arroyo MRN: [id number] Date of Birth: Sep 09, 1960

## 2017-05-11 ENCOUNTER — Ambulatory Visit: Payer: Medicare HMO

## 2017-05-11 DIAGNOSIS — M6281 Muscle weakness (generalized): Secondary | ICD-10-CM | POA: Diagnosis not present

## 2017-05-11 DIAGNOSIS — M25612 Stiffness of left shoulder, not elsewhere classified: Secondary | ICD-10-CM

## 2017-05-11 DIAGNOSIS — R293 Abnormal posture: Secondary | ICD-10-CM | POA: Diagnosis not present

## 2017-05-11 DIAGNOSIS — R252 Cramp and spasm: Secondary | ICD-10-CM

## 2017-05-11 DIAGNOSIS — M25611 Stiffness of right shoulder, not elsewhere classified: Secondary | ICD-10-CM | POA: Diagnosis not present

## 2017-05-11 DIAGNOSIS — G8929 Other chronic pain: Secondary | ICD-10-CM | POA: Diagnosis not present

## 2017-05-11 DIAGNOSIS — M542 Cervicalgia: Secondary | ICD-10-CM | POA: Diagnosis not present

## 2017-05-11 DIAGNOSIS — M25512 Pain in left shoulder: Secondary | ICD-10-CM | POA: Diagnosis not present

## 2017-05-11 DIAGNOSIS — M25511 Pain in right shoulder: Secondary | ICD-10-CM | POA: Diagnosis not present

## 2017-05-11 NOTE — Therapy (Signed)
[facility] [address], [place], [place], [address] Phone: [phone]   Fax:  [phone]  Physical Therapy Treatment  Patient Details  Name: Anita Arroyo MRN: [id number] Date of Birth: 09-23-1960 Referring Provider: Edmonia Lynch, MD   Encounter Date: 05/11/2017  PT End of Session - 05/11/17 1230    Visit Number  21    Date for PT Re-Evaluation  05/16/17    Authorization Type  Medicare KX now    PT Start Time  1147 dry needling    PT Stop Time  1238    PT Time Calculation (min)  51 min    Activity Tolerance  Patient tolerated treatment well    Behavior During Therapy  [facility] for tasks assessed/performed       Past Medical History:  Diagnosis Date  . Arthritis   . Asthma   . Bipolar 1 disorder ([place])   . Bipolar 1 disorder ([place])   . Breast cancer ([place])   . Depression   . Dysrhythmia    palpitations  . Dysrhythmia    atrial fibrillation  . Fibromyalgia   . Finger fracture 2011  . GERD (gastroesophageal reflux disease)   . Heart murmur   . History of stress test 04/28/2011   showed normal perfusion without scar or ischemia  . Hx of echocardiogram 04/28/2011   showed normal systolic function with mild diastolic dysfunction,she had trace MR but did not have frank mitral valve prolapse demonstrated. She had mild pulmonary hypertension with an estimated RV systolic pressure at 34 mm.  . Hyperlipemia   . Meniere disease   . MVP (mitral valve prolapse)   . Neuromuscular disorder ([place])    fibromyalgia    Past Surgical History:  Procedure Laterality Date  . ANKLE SURGERY     left x4  . BREAST SURGERY  2011   lumpectomy right breast   . COLONOSCOPY WITH PROPOFOL N/A 02/25/2014   Procedure: COLONOSCOPY WITH PROPOFOL;  Surgeon: Garlan Fair, MD;  [facility];  Service: Endoscopy;  Laterality: N/A;  . deviated septum    . ECTOPIC PREGNANCY SURGERY    . ELBOW SURGERY     right elbow  . EYE  SURGERY     cataract surgery  . fibroid     2-3 fibroid adenomas removed  . JOINT REPLACEMENT  2011   right knee   . KNEE SURGERY     left knee  . SHOULDER ARTHROSCOPY WITH BICEPSTENOTOMY Right 09/25/2015   Procedure: SHOULDER ARTHROSCOPY WITH BICEPSTENOTOMY;  Surgeon: Ninetta Lights, MD;  Location: [place];  Service: Orthopedics;  Laterality: Right;  . SHOULDER ARTHROSCOPY WITH DISTAL CLAVICLE RESECTION Right 09/25/2015   Procedure: SHOULDER ARTHROSCOPY WITH DISTAL CLAVICLE RESECTION;  Surgeon: Ninetta Lights, MD;  Location: [place];  Service: Orthopedics;  Laterality: Right;  . SHOULDER ARTHROSCOPY WITH SUBACROMIAL DECOMPRESSION Right 09/25/2015   Procedure: RIGHT SHOULDER ARTHROSCOPY DEBRIDEMENT,ACROMIOPLASTY,DISTAL CLAVICAL EXCISION, RELEASE OF BICEPS TENDON;  Surgeon: Ninetta Lights, MD;  Location: [place];  Service: Orthopedics;  Laterality: Right;  . TMJ ARTHROPLASTY    . TONSILLECTOMY    . TOTAL SHOULDER ARTHROPLASTY Left 01/05/2016  . UTERINE FIBROID SURGERY     polups and fibroids removed   . WRIST SURGERY     left    There were no vitals filed for this visit.  Subjective Assessment - 05/11/17 1151    Subjective  I am using my foam  roll.  It was helpful to go over exercises last session.  I'm sore in the back of my Lt shoulder due to yardwork yesterday.      Pertinent History  Rt breast cancer, Lt shoulder replacement 12/2015, Rt TKA .  Pt is hard of hearing and cries during treatment at times    Patient Stated Goals  improve strength of Lt shoulder, reduce Lt shoulder pain, use Lt arm without limitation    Currently in Pain?  Yes    Pain Score  7     Pain Location  Shoulder    Pain Orientation  Left    Pain Descriptors / Indicators  Sore    Pain Type  Chronic pain    Pain Onset  More than a month ago    Pain Frequency  Constant    Aggravating Factors   unknown    Pain Relieving Factors  rest, stretching    Pain  Score  8    Pain Location  Neck    Pain Orientation  Left;Right    Pain Descriptors / Indicators  Aching;Sore    Pain Type  Chronic pain    Pain Onset  More than a month ago    Pain Frequency  Constant    Aggravating Factors   turning head, sitting without support    Pain Relieving Factors  heat, dry needling                       [facility] Adult PT Treatment/Exercise - 05/11/17 0001      Shoulder Exercises: Pulleys   Flexion  3 minutes PT present to discuss progress    ABduction  3 minutes      Moist Heat Therapy   Number Minutes Moist Heat  15 Minutes    Moist Heat Location  Shoulder;Cervical      Manual Therapy   Manual Therapy  Soft tissue mobilization;Myofascial release    Manual therapy comments  soft tissue elongation and trigger point release to bil upper traps, suboccipitals and paraspinals    Soft tissue mobilization  skilled palpation and assessment during dry needling      Neck Exercises: Stretches   Other Neck Stretches  cervical A/ROM 3 ways 3x20 seconds       Trigger Point Dry Needling - 05/11/17 1157    Consent Given?  Yes    Muscles Treated Upper Body  Oblique capitus;Suboccipitals muscle group;Upper trapezius bil cervical multifidi    Upper Trapezius Response  Twitch reponse elicited;Palpable increased muscle length    Oblique Capitus Response  Twitch response elicited;Palpable increased muscle length    SubOccipitals Response  Twitch response elicited;Palpable increased muscle length             PT Short Term Goals - 05/09/17 1402      PT SHORT TERM GOAL #2   Title  demonstrate Lt shoulder A/ROM abduction with < or = to 6/10 pain to allow for reaching out to the side    Time  4    Period  Weeks    Status  Achieved      PT SHORT TERM GOAL #3   Title  demonstrate Lt shoulder A/ROM IR to L5 to improve self-care without substitution    Time  4    Period  Weeks    Status  Achieved      PT SHORT TERM GOAL #4   Title  report 25%  less Lt shoulder pain with putting  on her jacket    Time  4    Period  Weeks    Status  Achieved        PT Long Term Goals - 05/09/17 1402      PT LONG TERM GOAL #1   Title  be independent in advanced HEP    Time  6    Period  Weeks    Status  On-going      PT LONG TERM GOAL #3   Title  demonstrate Lt shoulder A/ROM IR to L3 to perform self-care without limitation    Time  6    Period  Weeks    Status  Achieved      PT LONG TERM GOAL #4   Title  reach out to the side with Lt UE with < or = to 4/10 Lt and Rt shoulder pain reported    Time  6    Period  Weeks    Status  On-going 5/10      PT LONG TERM GOAL #5   Title  report a 75% reduction in Lt shoulder pain with reaching and use    Time  6    Period  Weeks    Status  Achieved            Plan - 05/11/17 1223    Clinical Impression Statement  Pt has a plan to use her foam roll, strap for stretching, exercise book and get a ball for exercises after discharge.  Pt demonstrates trigger points and tension in bil neck and upper traps and this has improved since the start of care.  Pt with improved tissue mobility and reduced pain after manual therapy today. Pt will attend 1 more session for dry needling and to finalize HEP.    Rehab Potential  Good    PT Frequency  2x / week    PT Duration  6 weeks    PT Treatment/Interventions  ADLs/Self Care Home Management;Cryotherapy;Electrical Stimulation;Iontophoresis 4mg /ml Dexamethasone;Therapeutic activities;Neuromuscular re-education;Patient/family education;Passive range of motion;Manual techniques;Dry needling;Taping;Vasopneumatic Device    PT Next Visit Plan  1 more session.  Goal assessment and dry needling.    Consulted and Agree with Plan of Care  Patient       Patient will benefit from skilled therapeutic intervention in order to improve the following deficits and impairments:  Pain, Impaired flexibility, Decreased activity tolerance, Decreased range of motion, Decreased  strength, Impaired UE functional use, Postural dysfunction, Increased muscle spasms  Visit Diagnosis: Abnormal posture  Cervicalgia  Cramp and spasm  Muscle weakness (generalized)  Stiffness of left shoulder, not elsewhere classified  Stiffness of right shoulder, not elsewhere classified     Problem List Patient Active Problem List   Diagnosis Date Noted  . Prediabetes 10/21/2016  . Headache 10/12/2016  . 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 Chronic pain syndrome 05/27/2016  . Osteoarthritis 05/16/2016  . Seasonal allergies 05/03/2016  . History of palpitations 09/16/2015  . Bipolar 1 disorder ([place]) 09/16/2015  . History of breast cancer 11/16/2012  . Fibromyalgia 11/16/2012  . Smoker 11/16/2012  . INTERTRIGO 04/13/2010  . Hyperlipidemia 09/02/2006  . Depression 09/02/2006  . Essential hypertension 09/02/2006  . COPD (chronic obstructive pulmonary disease) ([place]) 09/02/2006  . GERD 09/02/2006    Sigurd Sos, PT 05/11/17 12:32 PM  [facility] [address], [place], [place], [address] Phone: [phone]   Fax:  [phone]  Name: Anita Arroyo MRN: [id number] Date of  Birth: [birth year]

## 2017-05-12 ENCOUNTER — Other Ambulatory Visit (HOSPITAL_COMMUNITY): Payer: Self-pay

## 2017-05-12 DIAGNOSIS — F3162 Bipolar disorder, current episode mixed, moderate: Secondary | ICD-10-CM

## 2017-05-12 MED ORDER — TRAZODONE HCL 100 MG PO TABS: 200.0000 mg | ORAL_TABLET | Freq: Every day | ORAL | 0 refills | Status: DC

## 2017-05-16 ENCOUNTER — Ambulatory Visit: Payer: Medicare HMO

## 2017-05-16 DIAGNOSIS — M542 Cervicalgia: Secondary | ICD-10-CM

## 2017-05-16 DIAGNOSIS — M6281 Muscle weakness (generalized): Secondary | ICD-10-CM

## 2017-05-16 DIAGNOSIS — R293 Abnormal posture: Secondary | ICD-10-CM

## 2017-05-16 DIAGNOSIS — M25511 Pain in right shoulder: Secondary | ICD-10-CM | POA: Diagnosis not present

## 2017-05-16 DIAGNOSIS — R252 Cramp and spasm: Secondary | ICD-10-CM | POA: Diagnosis not present

## 2017-05-16 DIAGNOSIS — G8929 Other chronic pain: Secondary | ICD-10-CM

## 2017-05-16 DIAGNOSIS — M25512 Pain in left shoulder: Secondary | ICD-10-CM | POA: Diagnosis not present

## 2017-05-16 DIAGNOSIS — M25611 Stiffness of right shoulder, not elsewhere classified: Secondary | ICD-10-CM | POA: Diagnosis not present

## 2017-05-16 DIAGNOSIS — M25612 Stiffness of left shoulder, not elsewhere classified: Secondary | ICD-10-CM

## 2017-05-16 NOTE — Therapy (Signed)
[facility] [address], [place], [place], [address] Phone: [phone]   Fax:  [phone]  Physical Therapy Treatment  Patient Details  Name: Anita Arroyo MRN: [id number] Date of Birth: [birth year] Referring Provider: Edmonia Lynch, MD   Encounter Date: 05/16/2017  PT End of Session - 05/16/17 1231    Visit Number  22    PT Start Time  5188    PT Stop Time  4166    PT Time Calculation (min)  54 min    Activity Tolerance  Patient tolerated treatment well    Behavior During Therapy  [facility] for tasks assessed/performed       Past Medical History:  Diagnosis Date  . Arthritis   . Asthma   . Bipolar 1 disorder ([place])   . Bipolar 1 disorder ([place])   . Breast cancer ([place])   . Depression   . Dysrhythmia    palpitations  . Dysrhythmia    atrial fibrillation  . Fibromyalgia   . Finger fracture 2011  . GERD (gastroesophageal reflux disease)   . Heart murmur   . History of stress test 04/28/2011   showed normal perfusion without scar or ischemia  . Hx of echocardiogram 04/28/2011   showed normal systolic function with mild diastolic dysfunction,she had trace MR but did not have frank mitral valve prolapse demonstrated. She had mild pulmonary hypertension with an estimated RV systolic pressure at 34 mm.  . Hyperlipemia   . Meniere disease   . MVP (mitral valve prolapse)   . Neuromuscular disorder ([place])    fibromyalgia    Past Surgical History:  Procedure Laterality Date  . ANKLE SURGERY     left x4  . BREAST SURGERY  2011   lumpectomy right breast   . COLONOSCOPY WITH PROPOFOL N/A 02/25/2014   Procedure: COLONOSCOPY WITH PROPOFOL;  Surgeon: Garlan Fair, MD;  Location: [facility];  Service: Endoscopy;  Laterality: N/A;  . deviated septum    . ECTOPIC PREGNANCY SURGERY    . ELBOW SURGERY     right elbow  . EYE SURGERY     cataract surgery  . fibroid     2-3 fibroid adenomas removed  . JOINT  REPLACEMENT  2011   right knee   . KNEE SURGERY     left knee  . SHOULDER ARTHROSCOPY WITH BICEPSTENOTOMY Right 09/25/2015   Procedure: SHOULDER ARTHROSCOPY WITH BICEPSTENOTOMY;  Surgeon: Ninetta Lights, MD;  Location: [place];  Service: Orthopedics;  Laterality: Right;  . SHOULDER ARTHROSCOPY WITH DISTAL CLAVICLE RESECTION Right 09/25/2015   Procedure: SHOULDER ARTHROSCOPY WITH DISTAL CLAVICLE RESECTION;  Surgeon: Ninetta Lights, MD;  Location: [place];  Service: Orthopedics;  Laterality: Right;  . SHOULDER ARTHROSCOPY WITH SUBACROMIAL DECOMPRESSION Right 09/25/2015   Procedure: RIGHT SHOULDER ARTHROSCOPY DEBRIDEMENT,ACROMIOPLASTY,DISTAL CLAVICAL EXCISION, RELEASE OF BICEPS TENDON;  Surgeon: Ninetta Lights, MD;  Location: [place];  Service: Orthopedics;  Laterality: Right;  . TMJ ARTHROPLASTY    . TONSILLECTOMY    . TOTAL SHOULDER ARTHROPLASTY Left 01/05/2016  . UTERINE FIBROID SURGERY     polups and fibroids removed   . WRIST SURGERY     left    There were no vitals filed for this visit.  Subjective Assessment - 05/16/17 1150    Subjective  Ready for discharge to HEP.  I have a foam roll and a book for exercises.      Pertinent History  Rt  breast cancer, Lt shoulder replacement 12/2015, Rt TKA .  Pt is hard of hearing and cries during treatment at times    Patient Stated Goals  improve strength of Lt shoulder, reduce Lt shoulder pain, use Lt arm without limitation    Currently in Pain?  Yes    Pain Score  6     Pain Location  Shoulder    Pain Orientation  Left    Pain Descriptors / Indicators  Sore    Pain Onset  More than a month ago    Pain Frequency  Constant    Aggravating Factors   unknown    Pain Relieving Factors  rest, stretching    Pain Score  7    Pain Location  Neck    Pain Orientation  Right;Left    Pain Descriptors / Indicators  Aching;Sore    Pain Type  Chronic pain    Pain Onset  More than a month ago     Pain Frequency  Constant    Aggravating Factors   turning head, sitting without support, stress    Pain Relieving Factors  heat, dry needling         [facility] PT Assessment - 05/16/17 0001      Assessment   Medical Diagnosis  shoulder weakness, bilateral shoulder and neck pain      Prior Function   Level of Independence  Independent    Vocation  Part time employment    Vocation Requirements  dog sitting/ walking, house sitting      Cognition   Overall Cognitive Status  Within Functional Limits for tasks assessed      AROM   Left Shoulder Flexion  150 Degrees    Left Shoulder ABduction  125 Degrees    Left Shoulder Internal Rotation  -- to L3    Left Shoulder External Rotation  -- to T3    Cervical Flexion  48    Cervical - Right Side Bend  40    Cervical - Left Side Bend  40    Cervical - Right Rotation  70    Cervical - Left Rotation  80                   [facility] Adult PT Treatment/Exercise - 05/16/17 0001      Shoulder Exercises: Pulleys   Flexion  3 minutes PT present to discuss progress    ABduction  3 minutes      Moist Heat Therapy   Number Minutes Moist Heat  15 Minutes    Moist Heat Location  Shoulder;Cervical      Manual Therapy   Manual Therapy  Soft tissue mobilization;Myofascial release    Manual therapy comments  soft tissue elongation and trigger point release to bil upper traps, suboccipitals and paraspinals    Soft tissue mobilization  skilled palpation and assessment during dry needling       Trigger Point Dry Needling - 05/16/17 1200    Consent Given?  Yes    Muscles Treated Upper Body  Oblique capitus;Upper trapezius;Suboccipitals muscle group bil cervical multifidi    Upper Trapezius Response  Palpable increased muscle length;Twitch reponse elicited    Oblique Capitus Response  Twitch response elicited;Palpable increased muscle length    SubOccipitals Response  Twitch response elicited;Palpable increased muscle length              PT Short Term Goals - 05/09/17 1402      PT SHORT TERM GOAL #2  Title  demonstrate Lt shoulder A/ROM abduction with < or = to 6/10 pain to allow for reaching out to the side    Time  4    Period  Weeks    Status  Achieved      PT SHORT TERM GOAL #3   Title  demonstrate Lt shoulder A/ROM IR to L5 to improve self-care without substitution    Time  4    Period  Weeks    Status  Achieved      PT SHORT TERM GOAL #4   Title  report 25% less Lt shoulder pain with putting on her jacket    Time  4    Period  Weeks    Status  Achieved        PT Long Term Goals - 05/16/17 1158      PT LONG TERM GOAL #1   Title  be independent in advanced HEP    Status  Achieved      PT LONG TERM GOAL #2   Title  reduce FOTO to < or = to 38% limitation    Baseline  43% limitation    Status  Not Met      PT LONG TERM GOAL #3   Title  demonstrate Lt shoulder A/ROM IR to L3 to perform self-care without limitation    Status  Achieved      PT LONG TERM GOAL #4   Title  reach out to the side with Lt UE with < or = to 4/10 Lt and Rt shoulder pain reported    Baseline  5/10    Status  Partially Met      PT LONG TERM GOAL #5   Title  report a 75% reduction in Lt shoulder pain with reaching and use    Baseline  50% reported    Status  Partially Met      PT LONG TERM GOAL #6   Title  report a 60% reduction in neck pain with turning head with driving and ADLs    Baseline  60%    Status  Achieved      PT LONG TERM GOAL #7   Title  demonstrate Rt cervical rotation to 70 degrees to improve rotation with driving    Status  Achieved              Patient will benefit from skilled therapeutic intervention in order to improve the following deficits and impairments:     Visit Diagnosis: Abnormal posture  Cervicalgia  Cramp and spasm  Muscle weakness (generalized)  Stiffness of left shoulder, not elsewhere classified  Chronic left shoulder pain     Problem  List Patient Active Problem List   Diagnosis Date Noted  . Prediabetes 10/21/2016  . Headache 10/12/2016  . 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 Chronic pain syndrome 05/27/2016  . Osteoarthritis 05/16/2016  . Seasonal allergies 05/03/2016  . History of palpitations 09/16/2015  . Bipolar 1 disorder ([place]) 09/16/2015  . History of breast cancer 11/16/2012  . Fibromyalgia 11/16/2012  . Smoker 11/16/2012  . INTERTRIGO 04/13/2010  . Hyperlipidemia 09/02/2006  . Depression 09/02/2006  . Essential hypertension 09/02/2006  . COPD (chronic obstructive pulmonary disease) ([place]) 09/02/2006  . GERD 09/02/2006   PHYSICAL THERAPY DISCHARGE SUMMARY  Visits from Start of Care: 22  Current functional level related to goals / functional outcomes: See above for current PT status.  Pt will D/C to HEP for continued further gains.  Remaining deficits: Chronic widespread pain.    Education / Equipment: HEP, posture Plan: Patient agrees to discharge.  Patient goals were partially met. Patient is being discharged due to being pleased with the current functional level.  ?????         Sigurd Sos, PT 05/16/17 12:34 PM  [facility] [address], [place], [place], [address] Phone: [phone]   Fax:  [phone]  Name: Anita Arroyo MRN: [id number] Date of Birth: 10-06-1960

## 2017-05-19 ENCOUNTER — Ambulatory Visit (HOSPITAL_COMMUNITY): Payer: Medicare HMO | Admitting: Psychiatry

## 2017-05-26 ENCOUNTER — Other Ambulatory Visit (HOSPITAL_COMMUNITY): Payer: Self-pay

## 2017-05-26 DIAGNOSIS — F3162 Bipolar disorder, current episode mixed, moderate: Secondary | ICD-10-CM

## 2017-05-26 MED ORDER — TRAZODONE HCL 100 MG PO TABS: 200.0000 mg | ORAL_TABLET | Freq: Every day | ORAL | 0 refills | Status: DC

## 2017-05-26 MED ORDER — LAMOTRIGINE 150 MG PO TABS: 150.0000 mg | ORAL_TABLET | Freq: Two times a day (BID) | ORAL | 0 refills | Status: DC

## 2017-05-26 MED ORDER — VENLAFAXINE HCL ER 150 MG PO CP24: 150.0000 mg | ORAL_CAPSULE | Freq: Every day | ORAL | 0 refills | Status: DC

## 2017-05-27 ENCOUNTER — Telehealth: Payer: Self-pay | Admitting: Physician Assistant

## 2017-05-27 ENCOUNTER — Encounter: Payer: Self-pay | Admitting: Physician Assistant

## 2017-05-27 DIAGNOSIS — G894 Chronic pain syndrome: Secondary | ICD-10-CM

## 2017-05-27 DIAGNOSIS — J449 Chronic obstructive pulmonary disease, unspecified: Secondary | ICD-10-CM

## 2017-05-27 NOTE — Telephone Encounter (Signed)
Copied from [place] [id number]. Topic: Inquiry >> May 27, 2017  3:39 PM Pricilla Handler wrote: Reason for CRM: [facility] with [place] [phone]) called requesting a refill of HYDROcodone-acetaminophen (NORCO/VICODIN) 5-325 MG tablet on behalf of this patient. Patient's preferred pharmacy is [facility], [place] - [place], [place] - [address] [phone] (Phone) [phone] (Fax).      Thank You!!!

## 2017-05-28 ENCOUNTER — Other Ambulatory Visit: Payer: Self-pay

## 2017-05-28 DIAGNOSIS — J449 Chronic obstructive pulmonary disease, unspecified: Secondary | ICD-10-CM

## 2017-05-28 DIAGNOSIS — G894 Chronic pain syndrome: Secondary | ICD-10-CM

## 2017-05-28 MED ORDER — HYDROCODONE-ACETAMINOPHEN 5-325 MG PO TABS: ORAL_TABLET | ORAL | 0 refills | Status: DC

## 2017-05-28 MED ORDER — FLUTICASONE-SALMETEROL 250-50 MCG/DOSE IN AEPB: 1.0000 | INHALATION_SPRAY | Freq: Two times a day (BID) | RESPIRATORY_TRACT | 3 refills | Status: DC

## 2017-05-28 NOTE — Addendum Note (Signed)
Addended by: Fara Chute on: 05/28/2017 02:58 PM   Modules accepted: Orders

## 2017-05-30 NOTE — Telephone Encounter (Signed)
Medication filled on 05/28/17

## 2017-06-08 DIAGNOSIS — M19012 Primary osteoarthritis, left shoulder: Secondary | ICD-10-CM | POA: Diagnosis not present

## 2017-06-09 DIAGNOSIS — H43393 Other vitreous opacities, bilateral: Secondary | ICD-10-CM | POA: Diagnosis not present

## 2017-06-09 DIAGNOSIS — H04123 Dry eye syndrome of bilateral lacrimal glands: Secondary | ICD-10-CM | POA: Diagnosis not present

## 2017-06-09 DIAGNOSIS — Z961 Presence of intraocular lens: Secondary | ICD-10-CM | POA: Diagnosis not present

## 2017-06-09 DIAGNOSIS — H524 Presbyopia: Secondary | ICD-10-CM | POA: Diagnosis not present

## 2017-06-09 DIAGNOSIS — G43809 Other migraine, not intractable, without status migrainosus: Secondary | ICD-10-CM | POA: Diagnosis not present

## 2017-06-14 DIAGNOSIS — L981 Factitial dermatitis: Secondary | ICD-10-CM | POA: Diagnosis not present

## 2017-06-14 DIAGNOSIS — L218 Other seborrheic dermatitis: Secondary | ICD-10-CM | POA: Diagnosis not present

## 2017-06-14 DIAGNOSIS — L720 Epidermal cyst: Secondary | ICD-10-CM | POA: Diagnosis not present

## 2017-06-14 DIAGNOSIS — D225 Melanocytic nevi of trunk: Secondary | ICD-10-CM | POA: Diagnosis not present

## 2017-06-14 DIAGNOSIS — L814 Other melanin hyperpigmentation: Secondary | ICD-10-CM | POA: Diagnosis not present

## 2017-06-23 ENCOUNTER — Encounter: Payer: Self-pay | Admitting: Physician Assistant

## 2017-06-24 ENCOUNTER — Encounter: Payer: Self-pay | Admitting: Physician Assistant

## 2017-06-24 ENCOUNTER — Other Ambulatory Visit: Payer: Self-pay

## 2017-06-24 ENCOUNTER — Ambulatory Visit (INDEPENDENT_AMBULATORY_CARE_PROVIDER_SITE_OTHER): Payer: Medicare HMO | Admitting: Physician Assistant

## 2017-06-24 DIAGNOSIS — J302 Other seasonal allergic rhinitis: Secondary | ICD-10-CM | POA: Diagnosis not present

## 2017-06-24 DIAGNOSIS — G894 Chronic pain syndrome: Secondary | ICD-10-CM

## 2017-06-24 DIAGNOSIS — F17200 Nicotine dependence, unspecified, uncomplicated: Secondary | ICD-10-CM | POA: Diagnosis not present

## 2017-06-24 MED ORDER — HYDROCODONE-ACETAMINOPHEN 5-325 MG PO TABS: ORAL_TABLET | ORAL | 0 refills | Status: DC

## 2017-06-24 MED ORDER — HYDROCODONE-ACETAMINOPHEN 5-325 MG PO TABS: 1.0000 | ORAL_TABLET | Freq: Four times a day (QID) | ORAL | 0 refills | Status: DC | PRN

## 2017-06-24 NOTE — Progress Notes (Signed)
Patient ID: [name], female    DOB: Apr 04, 1960, 57 y.o.   MRN: [id number]  PCP: Porfirio Oar, PA-C  Chief Complaint  Patient presents with  . Osteoarthritis    Subjective:   Presents for evaluation of osteoarthritis, needing prescription refills.  She is doing well overall, tolerating medications without adverse effects. Has been working with PT, specifically for the shoulder. Bipolar disorder managed by behavioral health. Not fasting today. 01/2017 A1C 5.6%. 10/2016 lipids never drawn.    Review of Systems No CP, SOB, HA, dizziness.  Depression screen [facility] 2/9 06/24/2017 01/28/2017 11/30/2016 10/12/2016 06/08/2016  Decreased Interest 0 3 0 0 0  Down, Depressed, Hopeless 0 3 0 0 0  PHQ - 2 Score 0 6 0 0 0  Altered sleeping - 3 - - -  Tired, decreased energy - 3 - - -  Change in appetite - 3 - - -  Feeling bad or failure about yourself  - 1 - - -  Trouble concentrating - 2 - - -  Moving slowly or fidgety/restless - 3 - - -  Suicidal thoughts - 0 - - -  PHQ-9 Score - 21 - - -  Some recent data might be hidden        Patient Active Problem List   Diagnosis Date Noted  . Prediabetes 10/21/2016  . Headache 10/12/2016  . 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 Chronic pain syndrome 05/27/2016  . Osteoarthritis 05/16/2016  . Seasonal allergies 05/03/2016  . History of palpitations 09/16/2015  . Bipolar 1 disorder ([facility]) 09/16/2015  . History of breast cancer 11/16/2012  . Fibromyalgia 11/16/2012  . Smoker 11/16/2012  . INTERTRIGO 04/13/2010  . Hyperlipidemia 09/02/2006  . Depression 09/02/2006  . Essential hypertension 09/02/2006  . COPD (chronic obstructive pulmonary disease) ([facility]) 09/02/2006  . GERD 09/02/2006     Prior to Admission medications   Medication Sig Start Date End Date Taking? Authorizing Provider  albuterol (PROVENTIL HFA;VENTOLIN HFA) 108 (90 Base) MCG/ACT inhaler Inhale 2 puffs every 4 (four) hours as needed into the lungs for  wheezing or shortness of breath (cough, shortness of breath or wheezing.). 11/30/16  Yes Myia Bergh, PA-C  Alcohol Swabs (B-D SINGLE USE SWABS REGULAR) PADS  12/31/16  Yes [provider]  atenolol (TENORMIN) 50 MG tablet Take 50 mg in the morning and 25 mg in the evening 04/27/17  Yes Lennette Bihari, MD  Biotin (BIOTIN 5000) 5 MG CAPS Take 5 mg by mouth daily.   Yes [provider]  blood glucose meter kit and supplies KIT Dispense based on patient and insurance preference. Use daily as directed. (FOR ICD-9 250.00, 250.01). 11/30/16  Yes Jamoni Broadfoot, PA-C  cetirizine (ZYRTEC) 10 MG tablet Take 10 mg by mouth daily.    Yes [provider]  diclofenac sodium (VOLTAREN) 1 % GEL  01/26/17  Yes [provider]  fluticasone (FLONASE) 50 MCG/ACT nasal spray USE 1 SPRAY IN EACH NOSTRIL TWICE DAILY 11/16/16  Yes Kady Toothaker, PA-C  Fluticasone-Salmeterol (ADVAIR) 250-50 MCG/DOSE AEPB Inhale 1 puff into the lungs 2 (two) times daily. 05/28/17  Yes Ara Mano, PA-C  HYDROcodone-acetaminophen (NORCO/VICODIN) 5-325 MG tablet take 1 TABLET BY MOUTH EVERY 6 HOURS AS NEEDED for moderate pain 05/28/17  Yes Keaunna Skipper, PA-C  lamoTRIgine (LAMICTAL) 150 MG tablet Take 1 tablet (150 mg total) by mouth 2 (two) times daily. 05/26/17  Yes Arfeen, Phillips Grout, MD  Magnesium 250 MG TABS Take 250 mg  by mouth daily.   Yes [provider]  meclizine (ANTIVERT) 25 MG tablet Take 1 tablet (25 mg total) by mouth as needed for dizziness. 11/10/16  Yes Lennette Bihari, MD  montelukast (SINGULAIR) 10 MG tablet Take 1 tablet (10 mg total) at bedtime by mouth. 11/30/16  Yes Shondale Quinley, PA-C  pantoprazole (PROTONIX) 40 MG tablet Take 1 tablet (40 mg total) by mouth daily. 04/26/17  Yes Talajah Slimp, PA-C  traZODone (DESYREL) 100 MG tablet Take 2 tablets (200 mg total) by mouth at bedtime. 05/26/17  Yes Arfeen, Phillips Grout, MD  triamterene-hydrochlorothiazide (MAXZIDE-25) 37.5-25 MG tablet  Take 0.5 tablets daily by mouth. 11/30/16  Yes Mekhi Lascola, Avelino Leeds, PA-C  TRUE METRIX BLOOD GLUCOSE TEST test strip  12/31/16  Yes [provider]  TRUEPLUS LANCETS 33G MISC  12/31/16  Yes [provider]  venlafaxine XR (EFFEXOR-XR) 150 MG 24 hr capsule Take 1 capsule (150 mg total) by mouth at bedtime. 05/26/17  Yes Arfeen, Phillips Grout, MD  rosuvastatin (CRESTOR) 20 MG tablet Take 1 tablet (20 mg total) by mouth daily. 08/19/16 11/17/16  Lennette Bihari, MD     Allergies  Allergen Reactions  . Contrast Media [Iodinated Diagnostic Agents] Shortness Of Breath  . Iodine Shortness Of Breath  . Adhesive [Tape] Other (See Comments)    Reaction:  Blisters   . Celecoxib     "I climb the walls"  . Statins     Body aches, constipation  . Ceclor [Cefaclor] Rash  . Moxifloxacin Rash  . Sulfonamide Derivatives Nausea And Vomiting       Objective:  Physical Exam  Constitutional: She is oriented to person, place, and time. She appears well-developed and well-nourished. She is active and cooperative. No distress.  BP 106/68   Pulse 73   Temp 99.4 F (37.4 C)   Resp 16   Ht 5' 2.5" (1.588 m)   Wt 162 lb (73.5 kg)   LMP 06/25/2011   SpO2 94%   BMI 29.16 kg/m   HENT:  Head: Normocephalic and atraumatic.  Right Ear: Hearing normal.  Left Ear: Hearing normal.  Eyes: Conjunctivae are normal. No scleral icterus.  Neck: Normal range of motion. Neck supple. No thyromegaly present.  Cardiovascular: Normal rate, regular rhythm and normal heart sounds.  Pulses:      Radial pulses are 2+ on the right side, and 2+ on the left side.  Pulmonary/Chest: Effort normal and breath sounds normal.  Lymphadenopathy:       Head (right side): No tonsillar, no preauricular, no posterior auricular and no occipital adenopathy present.       Head (left side): No tonsillar, no preauricular, no posterior auricular and no occipital adenopathy present.    She has no cervical adenopathy.       Right: No  supraclavicular adenopathy present.       Left: No supraclavicular adenopathy present.  Neurological: She is alert and oriented to person, place, and time. No sensory deficit.  Skin: Skin is warm, dry and intact. No rash noted. No cyanosis or erythema. Nails show no clubbing.  Psychiatric: She has a normal mood and affect. Her speech is normal and behavior is normal.           Assessment & Plan:   Problem List Items Addressed This Visit    Smoker    Encouraged smoking cessation.      Seasonal allergies    Stable.      Chronic pain syndrome  Stable. Controlled with current regimen. COntinue. Plan UDS and update controlled substance contract at next visit.      Relevant Medications   HYDROcodone-acetaminophen (NORCO/VICODIN) 5-325 MG tablet (Start on 07/27/2017)   HYDROcodone-acetaminophen (NORCO/VICODIN) 5-325 MG tablet       Return for re-evaluation in July.   Fernande Bras, PA-C Primary Care at [facility]

## 2017-06-24 NOTE — Patient Instructions (Signed)
     IF you received an x-ray today, you will receive an invoice from [facility]. Please contact [facility] at [phone] with questions or concerns regarding your invoice.   IF you received labwork today, you will receive an invoice from [facility]. Please contact [facility] at [phone] with questions or concerns regarding your invoice.   Our billing staff will not be able to assist you with questions regarding bills from these companies.  You will be contacted with the lab results as soon as they are available. The fastest way to get your results is to activate your My Chart account. Instructions are located on the last page of this paperwork. If you have not heard from us regarding the results in 2 weeks, please contact this office.     

## 2017-06-27 NOTE — Assessment & Plan Note (Signed)
Stable

## 2017-06-27 NOTE — Assessment & Plan Note (Signed)
Stable. Controlled with current regimen. COntinue. Plan UDS and update controlled substance contract at next visit.

## 2017-06-27 NOTE — Assessment & Plan Note (Signed)
Encouraged smoking cessation 

## 2017-06-28 ENCOUNTER — Other Ambulatory Visit (HOSPITAL_COMMUNITY): Payer: Self-pay

## 2017-06-28 DIAGNOSIS — F3162 Bipolar disorder, current episode mixed, moderate: Secondary | ICD-10-CM

## 2017-06-28 MED ORDER — LAMOTRIGINE 150 MG PO TABS: 150.0000 mg | ORAL_TABLET | Freq: Two times a day (BID) | ORAL | 0 refills | Status: DC

## 2017-06-28 MED ORDER — VENLAFAXINE HCL ER 150 MG PO CP24: 150.0000 mg | ORAL_CAPSULE | Freq: Every day | ORAL | 0 refills | Status: DC

## 2017-07-06 ENCOUNTER — Other Ambulatory Visit (HOSPITAL_COMMUNITY): Payer: Self-pay

## 2017-07-06 DIAGNOSIS — F3162 Bipolar disorder, current episode mixed, moderate: Secondary | ICD-10-CM

## 2017-07-06 MED ORDER — TRAZODONE HCL 100 MG PO TABS: 200.0000 mg | ORAL_TABLET | Freq: Every day | ORAL | 0 refills | Status: DC

## 2017-07-30 ENCOUNTER — Other Ambulatory Visit (HOSPITAL_COMMUNITY): Payer: Self-pay | Admitting: Psychiatry

## 2017-07-30 DIAGNOSIS — F3162 Bipolar disorder, current episode mixed, moderate: Secondary | ICD-10-CM

## 2017-08-01 ENCOUNTER — Other Ambulatory Visit (HOSPITAL_COMMUNITY): Payer: Self-pay

## 2017-08-01 DIAGNOSIS — F3162 Bipolar disorder, current episode mixed, moderate: Secondary | ICD-10-CM

## 2017-08-01 MED ORDER — LAMOTRIGINE 150 MG PO TABS: 150.0000 mg | ORAL_TABLET | Freq: Two times a day (BID) | ORAL | 0 refills | Status: DC

## 2017-08-01 MED ORDER — VENLAFAXINE HCL ER 150 MG PO CP24: 150.0000 mg | ORAL_CAPSULE | Freq: Every day | ORAL | 0 refills | Status: DC

## 2017-08-01 MED ORDER — TRAZODONE HCL 100 MG PO TABS: 200.0000 mg | ORAL_TABLET | Freq: Every day | ORAL | 0 refills | Status: DC

## 2017-08-12 DIAGNOSIS — F319 Bipolar disorder, unspecified: Secondary | ICD-10-CM | POA: Diagnosis not present

## 2017-08-17 DIAGNOSIS — M25572 Pain in left ankle and joints of left foot: Secondary | ICD-10-CM | POA: Diagnosis not present

## 2017-08-17 DIAGNOSIS — M19012 Primary osteoarthritis, left shoulder: Secondary | ICD-10-CM | POA: Diagnosis not present

## 2017-08-18 DIAGNOSIS — G894 Chronic pain syndrome: Secondary | ICD-10-CM | POA: Diagnosis not present

## 2017-08-18 DIAGNOSIS — J449 Chronic obstructive pulmonary disease, unspecified: Secondary | ICD-10-CM | POA: Diagnosis not present

## 2017-08-18 DIAGNOSIS — F17200 Nicotine dependence, unspecified, uncomplicated: Secondary | ICD-10-CM | POA: Diagnosis not present

## 2017-08-18 DIAGNOSIS — F319 Bipolar disorder, unspecified: Secondary | ICD-10-CM | POA: Diagnosis not present

## 2017-08-18 DIAGNOSIS — E785 Hyperlipidemia, unspecified: Secondary | ICD-10-CM | POA: Diagnosis not present

## 2017-08-18 DIAGNOSIS — R7303 Prediabetes: Secondary | ICD-10-CM | POA: Diagnosis not present

## 2017-08-18 DIAGNOSIS — J302 Other seasonal allergic rhinitis: Secondary | ICD-10-CM | POA: Diagnosis not present

## 2017-08-18 DIAGNOSIS — K219 Gastro-esophageal reflux disease without esophagitis: Secondary | ICD-10-CM | POA: Diagnosis not present

## 2017-08-18 DIAGNOSIS — I10 Essential (primary) hypertension: Secondary | ICD-10-CM | POA: Diagnosis not present

## 2017-08-26 ENCOUNTER — Institutional Professional Consult (permissible substitution): Payer: Self-pay | Admitting: Pulmonary Disease

## 2017-08-31 ENCOUNTER — Other Ambulatory Visit: Payer: Self-pay | Admitting: Physician Assistant

## 2017-08-31 DIAGNOSIS — Z1231 Encounter for screening mammogram for malignant neoplasm of breast: Secondary | ICD-10-CM

## 2017-09-01 ENCOUNTER — Ambulatory Visit: Payer: Medicare HMO | Attending: Orthopedic Surgery

## 2017-09-01 ENCOUNTER — Other Ambulatory Visit: Payer: Self-pay

## 2017-09-01 DIAGNOSIS — M6281 Muscle weakness (generalized): Secondary | ICD-10-CM | POA: Insufficient documentation

## 2017-09-01 DIAGNOSIS — M542 Cervicalgia: Secondary | ICD-10-CM | POA: Diagnosis not present

## 2017-09-01 DIAGNOSIS — R252 Cramp and spasm: Secondary | ICD-10-CM | POA: Diagnosis not present

## 2017-09-01 DIAGNOSIS — R293 Abnormal posture: Secondary | ICD-10-CM | POA: Insufficient documentation

## 2017-09-01 DIAGNOSIS — M25612 Stiffness of left shoulder, not elsewhere classified: Secondary | ICD-10-CM | POA: Diagnosis not present

## 2017-09-01 DIAGNOSIS — G8929 Other chronic pain: Secondary | ICD-10-CM | POA: Diagnosis not present

## 2017-09-01 DIAGNOSIS — M25512 Pain in left shoulder: Secondary | ICD-10-CM | POA: Diagnosis not present

## 2017-09-01 NOTE — Patient Instructions (Signed)
Access Code: [email or web address]  URL: [email or web address]  Date: 09/01/2017  Prepared by: Sigurd Sos   Exercises  Seated Shoulder Abduction - Palms Down - 10 reps - 2 sets - 2x daily - 7x weekly  Seated Shoulder Flexion - 10 reps - 2 sets - 2x daily - 7x weekly  Seated Shoulder Scaption - 10 reps - 2 sets - 2x daily - 7x weekly  Seated Cervical Flexion AROM - 10 reps - 3 sets - 1x daily - 7x weekly  Seated Cervical Sidebending AROM - 10 reps - 3 sets - 1x daily - 7x weekly  Seated Cervical Rotation AROM - 10 reps - 3 sets - 1x daily - 7x weekly  Seated Correct Posture - 10 reps - 3 sets - 1x daily - 7x weekly  Shoulder Extension with Resistance - Palms Forward - 10 reps - 2 sets - 2x daily - 7x weekly  Standing Bilateral Low Shoulder Row with Anchored Resistance - 10 reps - 2 sets - 2x daily - 7x weekly

## 2017-09-01 NOTE — Therapy (Signed)
[facility] [address] [place], [place], [address] Phone: [phone]   Fax:  [phone]  Physical Therapy Evaluation  Patient Details  Name: Anita Arroyo MRN: [id number] Date of Birth: [birth year] Referring Provider: Edmonia Lynch, MD   Encounter Date: 09/01/2017  PT End of Session - 09/01/17 1120    Visit Number  1    Date for PT Re-Evaluation  10/27/17    Authorization Type  [employer]    PT Start Time  1740    PT Stop Time  1058    PT Time Calculation (min)  43 min    Activity Tolerance  Patient tolerated treatment well    Behavior During Therapy  [facility] for tasks assessed/performed       Past Medical History:  Diagnosis Date  . Arthritis   . Asthma   . Bipolar 1 disorder ([place])   . Bipolar 1 disorder ([place])   . Breast cancer ([place])   . Depression   . Dysrhythmia    palpitations  . Dysrhythmia    atrial fibrillation  . Fibromyalgia   . Finger fracture 2011  . GERD (gastroesophageal reflux disease)   . Heart murmur   . History of stress test 04/28/2011   showed normal perfusion without scar or ischemia  . Hx of echocardiogram 04/28/2011   showed normal systolic function with mild diastolic dysfunction,she had trace MR but did not have frank mitral valve prolapse demonstrated. She had mild pulmonary hypertension with an estimated RV systolic pressure at 34 mm.  . Hyperlipemia   . Meniere disease   . MVP (mitral valve prolapse)   . Neuromuscular disorder ([place])    fibromyalgia    Past Surgical History:  Procedure Laterality Date  . ANKLE SURGERY     left x4  . BREAST SURGERY  2011   lumpectomy right breast   . COLONOSCOPY WITH PROPOFOL N/A 02/25/2014   Procedure: COLONOSCOPY WITH PROPOFOL;  Surgeon: Garlan Fair, MD;  Location: [facility];  Service: Endoscopy;  Laterality: N/A;  . deviated septum    . ECTOPIC PREGNANCY SURGERY    . ELBOW SURGERY     right elbow  . EYE SURGERY     cataract  surgery  . fibroid     2-3 fibroid adenomas removed  . JOINT REPLACEMENT  2011   right knee   . KNEE SURGERY     left knee  . SHOULDER ARTHROSCOPY WITH BICEPSTENOTOMY Right 09/25/2015   Procedure: SHOULDER ARTHROSCOPY WITH BICEPSTENOTOMY;  Surgeon: Ninetta Lights, MD;  Location: [place];  Service: Orthopedics;  Laterality: Right;  . SHOULDER ARTHROSCOPY WITH DISTAL CLAVICLE RESECTION Right 09/25/2015   Procedure: SHOULDER ARTHROSCOPY WITH DISTAL CLAVICLE RESECTION;  Surgeon: Ninetta Lights, MD;  Location: [place];  Service: Orthopedics;  Laterality: Right;  . SHOULDER ARTHROSCOPY WITH SUBACROMIAL DECOMPRESSION Right 09/25/2015   Procedure: RIGHT SHOULDER ARTHROSCOPY DEBRIDEMENT,ACROMIOPLASTY,DISTAL CLAVICAL EXCISION, RELEASE OF BICEPS TENDON;  Surgeon: Ninetta Lights, MD;  Location: [place];  Service: Orthopedics;  Laterality: Right;  . TMJ ARTHROPLASTY    . TONSILLECTOMY    . TOTAL SHOULDER ARTHROPLASTY Left 01/05/2016  . UTERINE FIBROID SURGERY     polups and fibroids removed   . WRIST SURGERY     left    There were no vitals filed for this visit.   Subjective Assessment - 09/01/17 1021    Subjective  Pt is a Rt hand dominant female who  presents to PT with chronic Lt shouldre pain and neck pain.  Pt has history of Lt shoulder replacement surgery 12/2015.  Pt had a flare-up ~6 weeks ago.      Pertinent History  breast cancer, Lt shoulder replacement 12/2015, Rt TKA, hard of hearing    Diagnostic tests  none    Patient Stated Goals  reduce neck pain, improve Lt shoulder pain    Currently in Pain?  Yes    Pain Score  8     Pain Location  Neck    Pain Orientation  Right;Left    Pain Descriptors / Indicators  Sore;Tightness;Tender    Pain Type  Chronic pain    Pain Onset  More than a month ago    Pain Frequency  Constant    Aggravating Factors   turning head, pretty much everything    Pain Relieving Factors  nothing, sleep     Multiple Pain Sites  Yes    Pain Score  4    Pain Location  Shoulder    Pain Orientation  Left    Pain Descriptors / Indicators  Tightness;Tender;Sore    Pain Type  Chronic pain    Pain Onset  More than a month ago    Pain Frequency  Constant    Aggravating Factors   reaching behind the back, use of Lt UE    Pain Relieving Factors  not using the arm         [facility] PT Assessment - 09/01/17 0001      Assessment   Medical Diagnosis  Lt shoulder and neck pain    Referring Provider  Edmonia Lynch, MD    Onset Date/Surgical Date  --    Hand Dominance  Right    Prior Therapy  at this clinic for dry needling and treatment      Precautions   Precautions  Other (comment)      Restrictions   Weight Bearing Restrictions  No      Balance Screen   Has the patient fallen in the past 6 months  No    Has the patient had a decrease in activity level because of a fear of falling?   No    Is the patient reluctant to leave their home because of a fear of falling?   No      Home Film/video editor residence    Living Arrangements  Alone      Prior Function   Level of Independence  Independent    Vocation  Part time employment    Vocation Requirements  pt works as a Engineering geologist    Leisure  reading, swimming      Cognition   Overall Cognitive Status  Within Functional Limits for tasks assessed      Observation/Other Assessments   Focus on Therapeutic Outcomes (FOTO)   36% limitation      Posture/Postural Control   Posture/Postural Control  Postural limitations    Postural Limitations  Rounded Shoulders;Forward head      ROM / Strength   AROM / PROM / Strength  AROM;PROM;Strength      AROM   Overall AROM   Deficits    AROM Assessment Site  Shoulder;Cervical    Right/Left Shoulder  Left    Left Shoulder Flexion  150 Degrees    Left Shoulder ABduction  126 Degrees    Left Shoulder Internal Rotation  --    Left Shoulder External Rotation  --  Cervical  Flexion  44    Cervical - Right Side Bend  40    Cervical - Left Side Bend  48    Cervical - Right Rotation  65    Cervical - Left Rotation  75      PROM   Overall PROM   Within functional limits for tasks performed      Strength   Overall Strength  Deficits    Strength Assessment Site  Shoulder    Right/Left Shoulder  Right;Left    Right Shoulder Flexion  4+/5    Right Shoulder ABduction  4+/5    Right Shoulder Internal Rotation  5/5    Right Shoulder External Rotation  4+/5    Left Shoulder Flexion  4-/5    Left Shoulder ABduction  4-/5    Left Shoulder Internal Rotation  4+/5    Left Shoulder External Rotation  4+/5      Palpation   Spinal mobility  reduced PA mobility-cervical and thoracic spine    Palpation comment  trigger points over bil upper traps, suboccipitals, cervical and thoracic paraspinals, Lt pectoralis and posterior deltoid      Transfers   Transfers  Independent with all Transfers      Ambulation/Gait   Gait Pattern  Within Functional Limits                Objective measurements completed on examination: See above findings.              PT Education - 09/01/17 1053    Education Details   Access Code: [email or web address]- not performed-verbal review as these are repeat exercises issued last plan of care    Person(s) Educated  Patient    Methods  Explanation;Demonstration;Handout    Comprehension  Verbalized understanding;Returned demonstration       PT Short Term Goals - 09/01/17 1018      PT SHORT TERM GOAL #1   Title  be independent in initial HEP    Time  4    Period  Weeks    Status  New    Target Date  09/29/17      PT SHORT TERM GOAL #2   Title  report a 25% reduction in cervical pain with ADLs and turning head    Time  4    Period  Weeks    Status  New    Target Date  09/29/17      PT SHORT TERM GOAL #3   Title  demonstrate correct posture and report corrections at home    Baseline  --    Time  4    Period  Weeks     Status  New    Target Date  09/29/17      PT SHORT TERM GOAL #4   Title  report a 30% reduction in the frequency and intensity of headaches    Time  4    Period  Weeks    Status  New    Target Date  09/29/17      PT SHORT TERM GOAL #5   Title  demonstrate Lt shoulder IR lacking < or = to 5 inches vs the Rt to improve self-care    Time  4    Status  New    Target Date  09/29/17        PT Long Term Goals - 09/01/17 1131      PT LONG TERM GOAL #1   Title  be independent  in advanced HEP    Time  8    Period  Weeks    Status  New    Target Date  10/27/17      PT LONG TERM GOAL #2   Title  report a 60% reduction in neck pain with ADLs and turning head    Baseline  --    Time  8    Period  Weeks    Status  New    Target Date  10/27/17      PT LONG TERM GOAL #3   Title  report a 75% reduction in the frequency and intensity of neck pain and headaches    Time  8    Period  Weeks    Status  New    Target Date  10/27/17      PT LONG TERM GOAL #4   Title  demonstrate Rt cervical A/ROM rotation to 75 degrees to improve safety with driving    Baseline  --    Time  8    Period  Weeks    Status  New    Target Date  10/27/17      PT LONG TERM GOAL #5   Title  report < or = to 5/10 Lt shoulder pain with reaching and use with ADLs    Baseline  --    Time  8    Period  Weeks    Status  New    Target Date  10/27/17             Plan - 09/01/17 1053    Clinical Impression Statement  Pt presents to PT with chronic Lt shoulder pain s/p Lt shoulder replacement surgery (12/2015) and neck pain/headaches.  Pt had treatment at this facility for Lt shoulder and neck pain ending in april and made good progress with ROM, strength and pain.  Pt reports a flare-up of pain ~6 weeks ago.  Pt demonstrates painful cervical A/ROM and Lt shoulder A/ROM limitations both with pain.  Pt with postural dysfunction and postural strength deficits and Lt shoulder weakness.  Pt with active trigger  point in bil upper traps, suboccipitals, Lt pec and posterior shoulder girdle.  Pt will benefit from skilled PT for dry needling, postural strength, shoulder A/ROM and strength and cervical mobility.      History and Personal Factors relevant to plan of care:  breast cancer, depression/anxiety, Lt shoulder replacement    Clinical Presentation  Evolving    Clinical Presentation due to:  worsening pain x 6 weeks    Clinical Decision Making  Moderate    Rehab Potential  Good    PT Frequency  2x / week    PT Duration  8 weeks    PT Treatment/Interventions  ADLs/Self Care Home Management;Cryotherapy;Electrical Stimulation;Traction;Moist Heat;Therapeutic activities;Therapeutic exercise;Patient/family education;Neuromuscular re-education;Manual techniques;Passive range of motion;Taping;Dry needling    PT Next Visit Plan  dry needling to neck including suboccipitals for headaches, review HEP issued today, manual and heat    PT Home Exercise Plan   Access Code: [email or web address]     Consulted and Agree with Plan of Care  Patient       Patient will benefit from skilled therapeutic intervention in order to improve the following deficits and impairments:  Impaired flexibility, Decreased activity tolerance, Decreased endurance, Decreased range of motion, Decreased strength, Postural dysfunction, Increased muscle spasms, Improper body mechanics, Pain  Visit Diagnosis: Abnormal posture - Plan: PT plan of care cert/re-cert  Cervicalgia - Plan: PT  plan of care cert/re-cert  Cramp and spasm - Plan: PT plan of care cert/re-cert  Muscle weakness (generalized) - Plan: PT plan of care cert/re-cert  Chronic left shoulder pain - Plan: PT plan of care cert/re-cert  Stiffness of left shoulder, not elsewhere classified - Plan: PT plan of care cert/re-cert     Problem List Patient Active Problem List   Diagnosis Date Noted  . Prediabetes 10/21/2016  . Headache 10/12/2016  . 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   . Chronic pain syndrome 05/27/2016  . Osteoarthritis 05/16/2016  . Seasonal allergies 05/03/2016  . History of palpitations 09/16/2015  . Bipolar 1 disorder ([place]) 09/16/2015  . History of breast cancer 11/16/2012  . Fibromyalgia 11/16/2012  . Smoker 11/16/2012  . INTERTRIGO 04/13/2010  . Hyperlipidemia 09/02/2006  . Depression 09/02/2006  . Essential hypertension 09/02/2006  . COPD (chronic obstructive pulmonary disease) ([place]) 09/02/2006  . GERD 09/02/2006     Sigurd Sos, PT 09/01/17 11:44 AM  [facility] [address], [place], [place], [address] Phone: [phone]   Fax:  [phone]  Name: DARIA MCMEEKIN MRN: [id number] Date of Birth: 08/05/1960

## 2017-09-07 ENCOUNTER — Encounter

## 2017-09-08 ENCOUNTER — Ambulatory Visit: Payer: Medicare HMO | Admitting: Physical Therapy

## 2017-09-08 ENCOUNTER — Encounter: Payer: Self-pay | Admitting: Physical Therapy

## 2017-09-08 DIAGNOSIS — M542 Cervicalgia: Secondary | ICD-10-CM

## 2017-09-08 DIAGNOSIS — R293 Abnormal posture: Secondary | ICD-10-CM | POA: Diagnosis not present

## 2017-09-08 DIAGNOSIS — M25512 Pain in left shoulder: Secondary | ICD-10-CM | POA: Diagnosis not present

## 2017-09-08 DIAGNOSIS — R252 Cramp and spasm: Secondary | ICD-10-CM

## 2017-09-08 DIAGNOSIS — M25612 Stiffness of left shoulder, not elsewhere classified: Secondary | ICD-10-CM | POA: Diagnosis not present

## 2017-09-08 DIAGNOSIS — M6281 Muscle weakness (generalized): Secondary | ICD-10-CM | POA: Diagnosis not present

## 2017-09-08 DIAGNOSIS — G8929 Other chronic pain: Secondary | ICD-10-CM | POA: Diagnosis not present

## 2017-09-08 NOTE — Therapy (Signed)
[facility] [address], [place], [place], [address] Phone: [phone]   Fax:  [phone]  Physical Therapy Treatment  Patient Details  Name: Anita Arroyo MRN: [id number] Date of Birth: 1960/02/23 Referring Provider: Edmonia Lynch, MD   Encounter Date: 09/08/2017  PT End of Session - 09/08/17 2011    Visit Number  2    Date for PT Re-Evaluation  10/27/17    Authorization Type  [employer]    PT Start Time  1100    PT Stop Time  1150    PT Time Calculation (min)  50 min    Activity Tolerance  Patient tolerated treatment well       Past Medical History:  Diagnosis Date  . Arthritis   . Asthma   . Bipolar 1 disorder ([place])   . Bipolar 1 disorder ([place])   . Breast cancer ([place])   . Depression   . Dysrhythmia    palpitations  . Dysrhythmia    atrial fibrillation  . Fibromyalgia   . Finger fracture 2011  . GERD (gastroesophageal reflux disease)   . Heart murmur   . History of stress test 04/28/2011   showed normal perfusion without scar or ischemia  . Hx of echocardiogram 04/28/2011   showed normal systolic function with mild diastolic dysfunction,she had trace MR but did not have frank mitral valve prolapse demonstrated. She had mild pulmonary hypertension with an estimated RV systolic pressure at 34 mm.  . Hyperlipemia   . Meniere disease   . MVP (mitral valve prolapse)   . Neuromuscular disorder ([place])    fibromyalgia    Past Surgical History:  Procedure Laterality Date  . ANKLE SURGERY     left x4  . BREAST SURGERY  2011   lumpectomy right breast   . COLONOSCOPY WITH PROPOFOL N/A 02/25/2014   Procedure: COLONOSCOPY WITH PROPOFOL;  Surgeon: Garlan Fair, MD;  Location: [facility];  Service: Endoscopy;  Laterality: N/A;  . deviated septum    . ECTOPIC PREGNANCY SURGERY    . ELBOW SURGERY     right elbow  . EYE SURGERY     cataract surgery  . fibroid     2-3 fibroid adenomas removed  . JOINT  REPLACEMENT  2011   right knee   . KNEE SURGERY     left knee  . SHOULDER ARTHROSCOPY WITH BICEPSTENOTOMY Right 09/25/2015   Procedure: SHOULDER ARTHROSCOPY WITH BICEPSTENOTOMY;  Surgeon: Ninetta Lights, MD;  Location: [place];  Service: Orthopedics;  Laterality: Right;  . SHOULDER ARTHROSCOPY WITH DISTAL CLAVICLE RESECTION Right 09/25/2015   Procedure: SHOULDER ARTHROSCOPY WITH DISTAL CLAVICLE RESECTION;  Surgeon: Ninetta Lights, MD;  Location: [place];  Service: Orthopedics;  Laterality: Right;  . SHOULDER ARTHROSCOPY WITH SUBACROMIAL DECOMPRESSION Right 09/25/2015   Procedure: RIGHT SHOULDER ARTHROSCOPY DEBRIDEMENT,ACROMIOPLASTY,DISTAL CLAVICAL EXCISION, RELEASE OF BICEPS TENDON;  Surgeon: Ninetta Lights, MD;  Location: [place];  Service: Orthopedics;  Laterality: Right;  . TMJ ARTHROPLASTY    . TONSILLECTOMY    . TOTAL SHOULDER ARTHROPLASTY Left 01/05/2016  . UTERINE FIBROID SURGERY     polups and fibroids removed   . WRIST SURGERY     left    There were no vitals filed for this visit.  Subjective Assessment - 09/08/17 1100    Subjective  Things are going OK. Pain is the same as last time although she reports ROM testing of her shoulder "really  got it stirred up."  She reports a good understanding of her HEP and declines the need for further review.  She is eager to do DN as this helped her a lot in the past.      Pertinent History  breast cancer, Lt shoulder replacement 12/2015, Rt TKA, hard of hearing    Currently in Pain?  Yes    Pain Score  8     Pain Location  Neck    Pain Orientation  Left;Right                       [facility] Adult PT Treatment/Exercise - 09/08/17 0001      Moist Heat Therapy   Number Minutes Moist Heat  13 Minutes    Moist Heat Location  Cervical      Electrical Stimulation   Electrical Stimulation Location  cervical     Electrical Stimulation Action  IFC    Electrical Stimulation  Parameters  9 ma 13 min supine with back on wedge propped up     Electrical Stimulation Goals  Pain      Manual Therapy   Manual therapy comments  suboccipital release, cranial circles    Soft tissue mobilization  bil cervical paraspinals, suboccipitals, upper traps    Manual Traction  3x 20 sec holds    Muscle Energy Technique  upper trap contract relax 3x 5 sec holds       Trigger Point Dry Needling - 09/08/17 2008    Consent Given?  Yes    Education Handout Provided  --   received previously    Muscles Treated Upper Body  Upper trapezius;Oblique capitus;Suboccipitals muscle group   bil cervical multifidi    Upper Trapezius Response  Twitch reponse elicited;Palpable increased muscle length    Oblique Capitus Response  Palpable increased muscle length    SubOccipitals Response  Palpable increased muscle length      Performed bilaterally        PT Short Term Goals - 09/01/17 1018      PT SHORT TERM GOAL #1   Title  be independent in initial HEP    Time  4    Period  Weeks    Status  New    Target Date  09/29/17      PT SHORT TERM GOAL #2   Title  report a 25% reduction in cervical pain with ADLs and turning head    Time  4    Period  Weeks    Status  New    Target Date  09/29/17      PT SHORT TERM GOAL #3   Title  demonstrate correct posture and report corrections at home    Baseline  --    Time  4    Period  Weeks    Status  New    Target Date  09/29/17      PT SHORT TERM GOAL #4   Title  report a 30% reduction in the frequency and intensity of headaches    Time  4    Period  Weeks    Status  New    Target Date  09/29/17      PT SHORT TERM GOAL #5   Title  demonstrate Lt shoulder IR lacking < or = to 5 inches vs the Rt to improve self-care    Time  4    Status  New    Target Date  09/29/17  PT Long Term Goals - 09/01/17 1131      PT LONG TERM GOAL #1   Title  be independent in advanced HEP    Time  8    Period  Weeks    Status  New     Target Date  10/27/17      PT LONG TERM GOAL #2   Title  report a 60% reduction in neck pain with ADLs and turning head    Baseline  --    Time  8    Period  Weeks    Status  New    Target Date  10/27/17      PT LONG TERM GOAL #3   Title  report a 75% reduction in the frequency and intensity of neck pain and headaches    Time  8    Period  Weeks    Status  New    Target Date  10/27/17      PT LONG TERM GOAL #4   Title  demonstrate Rt cervical A/ROM rotation to 75 degrees to improve safety with driving    Baseline  --    Time  8    Period  Weeks    Status  New    Target Date  10/27/17      PT LONG TERM GOAL #5   Title  report < or = to 5/10 Lt shoulder pain with reaching and use with ADLs    Baseline  --    Time  8    Period  Weeks    Status  New    Target Date  10/27/17            Plan - 09/08/17 2012    Clinical Impression Statement  The patient reports moderate to severe neck pain/headache today.  She has numerous tender points and taut bands in cervical paraspinals, upper traps and suboccipitals.  Following DN and manual therapy her soft tissue mobility is much improved.  Good pain relief with ES/heat.  Treatment focus on pain management today.  Therapist closely monitoring response with all interventions.      Rehab Potential  Good    PT Frequency  2x / week    PT Duration  8 weeks    PT Treatment/Interventions  ADLs/Self Care Home Management;Cryotherapy;Electrical Stimulation;Traction;Moist Heat;Therapeutic activities;Therapeutic exercise;Patient/family education;Neuromuscular re-education;Manual techniques;Passive range of motion;Taping;Dry needling    PT Next Visit Plan  assess response to dry needling #1 to neck including suboccipitals for headaches, progress HEP as tolerated;  manual and heat/ES as needed    PT Home Exercise Plan   Access Code: [facility]        Patient will benefit from skilled therapeutic intervention in order to improve the following  deficits and impairments:  Impaired flexibility, Decreased activity tolerance, Decreased endurance, Decreased range of motion, Decreased strength, Postural dysfunction, Increased muscle spasms, Improper body mechanics, Pain  Visit Diagnosis: Abnormal posture  Cervicalgia  Cramp and spasm     Problem List Patient Active Problem List   Diagnosis Date Noted  . Prediabetes 10/21/2016  . Headache 10/12/2016  . 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 Chronic pain syndrome 05/27/2016  . Osteoarthritis 05/16/2016  . Seasonal allergies 05/03/2016  . History of palpitations 09/16/2015  . Bipolar 1 disorder ([place]) 09/16/2015  . History of breast cancer 11/16/2012  . Fibromyalgia 11/16/2012  . Smoker 11/16/2012  . INTERTRIGO 04/13/2010  . Hyperlipidemia 09/02/2006  . Depression 09/02/2006  . Essential hypertension 09/02/2006  .  COPD (chronic obstructive pulmonary disease) ([place]) 09/02/2006  . GERD 09/02/2006   Ruben Im, PT 09/08/17 8:18 PM Phone: [phone] Fax: [phone]  Alvera Singh 09/08/2017, 8:17 PM  [facility] [address] [place], [place], [address] Phone: [phone]   Fax:  [phone]  Name: ROGER FASNACHT MRN: [id number] Date of Birth: August 26, 1960

## 2017-09-14 ENCOUNTER — Ambulatory Visit: Payer: Medicare HMO

## 2017-09-14 DIAGNOSIS — M25512 Pain in left shoulder: Secondary | ICD-10-CM | POA: Diagnosis not present

## 2017-09-14 DIAGNOSIS — M542 Cervicalgia: Secondary | ICD-10-CM | POA: Diagnosis not present

## 2017-09-14 DIAGNOSIS — M6281 Muscle weakness (generalized): Secondary | ICD-10-CM | POA: Diagnosis not present

## 2017-09-14 DIAGNOSIS — R252 Cramp and spasm: Secondary | ICD-10-CM

## 2017-09-14 DIAGNOSIS — M25612 Stiffness of left shoulder, not elsewhere classified: Secondary | ICD-10-CM | POA: Diagnosis not present

## 2017-09-14 DIAGNOSIS — R293 Abnormal posture: Secondary | ICD-10-CM

## 2017-09-14 DIAGNOSIS — G8929 Other chronic pain: Secondary | ICD-10-CM | POA: Diagnosis not present

## 2017-09-14 NOTE — Therapy (Signed)
[facility] [address] [place], [place], [address] Phone: [phone]   Fax:  [phone]  Physical Therapy Treatment  Patient Details  Name: Anita Arroyo MRN: [id number] Date of Birth: 11-05-1960 Referring Provider: Edmonia Lynch, MD   Encounter Date: 09/14/2017  PT End of Session - 09/14/17 1229    Visit Number  3    Date for PT Re-Evaluation  10/27/17    Authorization Type  [employer]    PT Start Time  1148   dry needling   PT Stop Time  1243    PT Time Calculation (min)  55 min    Activity Tolerance  Patient tolerated treatment well    Behavior During Therapy  [facility] for tasks assessed/performed       Past Medical History:  Diagnosis Date  . Arthritis   . Asthma   . Bipolar 1 disorder ([place])   . Bipolar 1 disorder ([place])   . Breast cancer ([place])   . Depression   . Dysrhythmia    palpitations  . Dysrhythmia    atrial fibrillation  . Fibromyalgia   . Finger fracture 2011  . GERD (gastroesophageal reflux disease)   . Heart murmur   . History of stress test 04/28/2011   showed normal perfusion without scar or ischemia  . Hx of echocardiogram 04/28/2011   showed normal systolic function with mild diastolic dysfunction,she had trace MR but did not have frank mitral valve prolapse demonstrated. She had mild pulmonary hypertension with an estimated RV systolic pressure at 34 mm.  . Hyperlipemia   . Meniere disease   . MVP (mitral valve prolapse)   . Neuromuscular disorder ([place])    fibromyalgia    Past Surgical History:  Procedure Laterality Date  . ANKLE SURGERY     left x4  . BREAST SURGERY  2011   lumpectomy right breast   . COLONOSCOPY WITH PROPOFOL N/A 02/25/2014   Procedure: COLONOSCOPY WITH PROPOFOL;  Surgeon: Garlan Fair, MD;  Location: [facility];  Service: Endoscopy;  Laterality: N/A;  . deviated septum    . ECTOPIC PREGNANCY SURGERY    . ELBOW SURGERY     right elbow  . EYE SURGERY      cataract surgery  . fibroid     2-3 fibroid adenomas removed  . JOINT REPLACEMENT  2011   right knee   . KNEE SURGERY     left knee  . SHOULDER ARTHROSCOPY WITH BICEPSTENOTOMY Right 09/25/2015   Procedure: SHOULDER ARTHROSCOPY WITH BICEPSTENOTOMY;  Surgeon: Ninetta Lights, MD;  Location: [place];  Service: Orthopedics;  Laterality: Right;  . SHOULDER ARTHROSCOPY WITH DISTAL CLAVICLE RESECTION Right 09/25/2015   Procedure: SHOULDER ARTHROSCOPY WITH DISTAL CLAVICLE RESECTION;  Surgeon: Ninetta Lights, MD;  Location: [place];  Service: Orthopedics;  Laterality: Right;  . SHOULDER ARTHROSCOPY WITH SUBACROMIAL DECOMPRESSION Right 09/25/2015   Procedure: RIGHT SHOULDER ARTHROSCOPY DEBRIDEMENT,ACROMIOPLASTY,DISTAL CLAVICAL EXCISION, RELEASE OF BICEPS TENDON;  Surgeon: Ninetta Lights, MD;  Location: [facility];  Service: Orthopedics;  Laterality: Right;  . TMJ ARTHROPLASTY    . TONSILLECTOMY    . TOTAL SHOULDER ARTHROPLASTY Left 01/05/2016  . UTERINE FIBROID SURGERY     polups and fibroids removed   . WRIST SURGERY     left    There were no vitals filed for this visit.  Subjective Assessment - 09/14/17 1152    Subjective  I've had headaches mostly due to  being stressed    Currently in Pain?  Yes    Pain Score  7     Pain Location  Neck    Pain Orientation  Right;Left    Pain Descriptors / Indicators  Sore;Tightness    Pain Type  Chronic pain    Pain Onset  More than a month ago    Pain Frequency  Constant    Aggravating Factors   turning head, everything    Pain Relieving Factors  nothing, sleep                       [facility] Adult PT Treatment/Exercise - 09/14/17 0001      Moist Heat Therapy   Number Minutes Moist Heat  15 Minutes    Moist Heat Location  Cervical      Electrical Stimulation   Electrical Stimulation Location  cervical     Electrical Stimulation Action  IFC    Electrical Stimulation Parameters   15 minutes    Electrical Stimulation Goals  Pain      Manual Therapy   Manual Therapy  Soft tissue mobilization;Myofascial release    Manual therapy comments  soft tissue elongation and trigger point release to neck, suboccipitals and upper traps       Trigger Point Dry Needling - 09/14/17 1152    Consent Given?  Yes    Muscles Treated Upper Body  Upper trapezius;Oblique capitus;Suboccipitals muscle group   cervical multifidi   Upper Trapezius Response  Palpable increased muscle length;Twitch reponse elicited    Oblique Capitus Response  Twitch response elicited;Palpable increased muscle length    SubOccipitals Response  Twitch response elicited;Palpable increased muscle length             PT Short Term Goals - 09/01/17 1018      PT SHORT TERM GOAL #1   Title  be independent in initial HEP    Time  4    Period  Weeks    Status  New    Target Date  09/29/17      PT SHORT TERM GOAL #2   Title  report a 25% reduction in cervical pain with ADLs and turning head    Time  4    Period  Weeks    Status  New    Target Date  09/29/17      PT SHORT TERM GOAL #3   Title  demonstrate correct posture and report corrections at home    Baseline  --    Time  4    Period  Weeks    Status  New    Target Date  09/29/17      PT SHORT TERM GOAL #4   Title  report a 30% reduction in the frequency and intensity of headaches    Time  4    Period  Weeks    Status  New    Target Date  09/29/17      PT SHORT TERM GOAL #5   Title  demonstrate Lt shoulder IR lacking < or = to 5 inches vs the Rt to improve self-care    Time  4    Status  New    Target Date  09/29/17        PT Long Term Goals - 09/01/17 1131      PT LONG TERM GOAL #1   Title  be independent in advanced HEP    Time  8  Period  Weeks    Status  New    Target Date  10/27/17      PT LONG TERM GOAL #2   Title  report a 60% reduction in neck pain with ADLs and turning head    Baseline  --    Time  8     Period  Weeks    Status  New    Target Date  10/27/17      PT LONG TERM GOAL #3   Title  report a 75% reduction in the frequency and intensity of neck pain and headaches    Time  8    Period  Weeks    Status  New    Target Date  10/27/17      PT LONG TERM GOAL #4   Title  demonstrate Rt cervical A/ROM rotation to 75 degrees to improve safety with driving    Baseline  --    Time  8    Period  Weeks    Status  New    Target Date  10/27/17      PT LONG TERM GOAL #5   Title  report < or = to 5/10 Lt shoulder pain with reaching and use with ADLs    Baseline  --    Time  8    Period  Weeks    Status  New    Target Date  10/27/17            Plan - 09/14/17 1227    Clinical Impression Statement  The patient reports moderate to severe neck pain/headache over the past week.  She has numerous tender points and taut bands in cervical paraspinals, upper traps and suboccipitals.  Following DN and manual therapy her soft tissue mobility is much improved.  Good pain relief with modalities today.  Treatment focus on pain management today as pt is leaving for a trip and in need of treatment relief.     Rehab Potential  Good    PT Frequency  2x / week    PT Treatment/Interventions  ADLs/Self Care Home Management;Cryotherapy;Electrical Stimulation;Traction;Moist Heat;Therapeutic activities;Therapeutic exercise;Patient/family education;Neuromuscular re-education;Manual techniques;Passive range of motion;Taping;Dry needling    PT Next Visit Plan  assess response to dry needling #2 to neck including suboccipitals for headaches, progress HEP as tolerated;  manual and heat/ES as needed.  Pt will be out of town.     PT Home Exercise Plan   Access Code: [email or web address]     Consulted and Agree with Plan of Care  Patient       Patient will benefit from skilled therapeutic intervention in order to improve the following deficits and impairments:  Impaired flexibility, Decreased activity tolerance, Decreased  endurance, Decreased range of motion, Decreased strength, Postural dysfunction, Increased muscle spasms, Improper body mechanics, Pain  Visit Diagnosis: Abnormal posture  Cervicalgia  Cramp and spasm  Muscle weakness (generalized)     Problem List Patient Active Problem List   Diagnosis Date Noted  . Prediabetes 10/21/2016  . Headache 10/12/2016  . 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 Chronic pain syndrome 05/27/2016  . Osteoarthritis 05/16/2016  . Seasonal allergies 05/03/2016  . History of palpitations 09/16/2015  . Bipolar 1 disorder ([place]) 09/16/2015  . History of breast cancer 11/16/2012  . Fibromyalgia 11/16/2012  . Smoker 11/16/2012  . INTERTRIGO 04/13/2010  . Hyperlipidemia 09/02/2006  . Depression 09/02/2006  . Essential hypertension 09/02/2006  . COPD (chronic obstructive pulmonary disease) ([place]) 09/02/2006  . GERD 09/02/2006  Sigurd Sos, PT 09/14/17 12:32 PM  [facility] [address], [place], [place], [address] Phone: [phone]   Fax:  [phone]  Name: JERMIYAH RICOTTA MRN: [id number] Date of Birth: May 13, 1960

## 2017-09-23 ENCOUNTER — Encounter: Payer: Self-pay | Admitting: Physical Therapy

## 2017-10-11 ENCOUNTER — Ambulatory Visit: Payer: Medicare HMO | Attending: Orthopedic Surgery | Admitting: Physical Therapy

## 2017-10-11 ENCOUNTER — Encounter: Payer: Self-pay | Admitting: Physical Therapy

## 2017-10-11 DIAGNOSIS — M542 Cervicalgia: Secondary | ICD-10-CM | POA: Diagnosis not present

## 2017-10-11 DIAGNOSIS — R252 Cramp and spasm: Secondary | ICD-10-CM | POA: Diagnosis not present

## 2017-10-11 DIAGNOSIS — M6281 Muscle weakness (generalized): Secondary | ICD-10-CM | POA: Insufficient documentation

## 2017-10-11 DIAGNOSIS — R293 Abnormal posture: Secondary | ICD-10-CM | POA: Diagnosis not present

## 2017-10-11 NOTE — Therapy (Signed)
[facility] [address] [place], [place], [address] Phone: [phone]   Fax:  [phone]  Physical Therapy Treatment  Patient Details  Name: Anita Arroyo MRN: [id number] Date of Birth: 1960-11-07 Referring Provider: Edmonia Lynch, MD   Encounter Date: 10/11/2017  PT End of Session - 10/11/17 1936    Visit Number  4    Date for PT Re-Evaluation  10/27/17    Authorization Type  [employer]    PT Start Time  0355    PT Stop Time  1105    PT Time Calculation (min)  50 min    Activity Tolerance  Patient tolerated treatment well       Past Medical History:  Diagnosis Date  . Arthritis   . Asthma   . Bipolar 1 disorder ([place])   . Bipolar 1 disorder ([place])   . Breast cancer ([place])   . Depression   . Dysrhythmia    palpitations  . Dysrhythmia    atrial fibrillation  . Fibromyalgia   . Finger fracture 2011  . GERD (gastroesophageal reflux disease)   . Heart murmur   . History of stress test 04/28/2011   showed normal perfusion without scar or ischemia  . Hx of echocardiogram 04/28/2011   showed normal systolic function with mild diastolic dysfunction,she had trace MR but did not have frank mitral valve prolapse demonstrated. She had mild pulmonary hypertension with an estimated RV systolic pressure at 34 mm.  . Hyperlipemia   . Meniere disease   . MVP (mitral valve prolapse)   . Neuromuscular disorder ([place])    fibromyalgia    Past Surgical History:  Procedure Laterality Date  . ANKLE SURGERY     left x4  . BREAST SURGERY  2011   lumpectomy right breast   . COLONOSCOPY WITH PROPOFOL N/A 02/25/2014   Procedure: COLONOSCOPY WITH PROPOFOL;  Surgeon: Garlan Fair, MD;  Location: [facility];  Service: Endoscopy;  Laterality: N/A;  . deviated septum    . ECTOPIC PREGNANCY SURGERY    . ELBOW SURGERY     right elbow  . EYE SURGERY     cataract surgery  . fibroid     2-3 fibroid adenomas removed  . JOINT  REPLACEMENT  2011   right knee   . KNEE SURGERY     left knee  . SHOULDER ARTHROSCOPY WITH BICEPSTENOTOMY Right 09/25/2015   Procedure: SHOULDER ARTHROSCOPY WITH BICEPSTENOTOMY;  Surgeon: Ninetta Lights, MD;  Location: [facility];  Service: Orthopedics;  Laterality: Right;  . SHOULDER ARTHROSCOPY WITH DISTAL CLAVICLE RESECTION Right 09/25/2015   Procedure: SHOULDER ARTHROSCOPY WITH DISTAL CLAVICLE RESECTION;  Surgeon: Ninetta Lights, MD;  Location: [facility];  Service: Orthopedics;  Laterality: Right;  . SHOULDER ARTHROSCOPY WITH SUBACROMIAL DECOMPRESSION Right 09/25/2015   Procedure: RIGHT SHOULDER ARTHROSCOPY DEBRIDEMENT,ACROMIOPLASTY,DISTAL CLAVICAL EXCISION, RELEASE OF BICEPS TENDON;  Surgeon: Ninetta Lights, MD;  Location: [facility];  Service: Orthopedics;  Laterality: Right;  . TMJ ARTHROPLASTY    . TONSILLECTOMY    . TOTAL SHOULDER ARTHROPLASTY Left 01/05/2016  . UTERINE FIBROID SURGERY     polups and fibroids removed   . WRIST SURGERY     left    There were no vitals filed for this visit.  Subjective Assessment - 10/11/17 1012    Subjective  My head is killing me.  My neck and shoulders are killing me.  My knee hurts.  Driving on  my trip was stressful.  I love the needling.  It really helps.   A dog jerked me and the front of my shoulder hurts on the front.    (Pended)     Pertinent History  breast cancer, Lt shoulder replacement 12/2015, Rt TKA, hard of hearing    Patient Stated Goals  reduce neck pain, improve Lt shoulder pain    Currently in Pain?  Yes    Pain Score  7     Pain Location  Neck    Pain Frequency  Constant    Pain Relieving Factors  dry needling                        [facility] Adult PT Treatment/Exercise - 10/11/17 0001      Self-Care   Self-Care  Other Self-Care Comments    Other Self-Care Comments   discussion of meditation for stress and pain relief        Neck Exercises: Machines for  Strengthening   Nustep  10 min L1    while discussing status and plan      Moist Heat Therapy   Number Minutes Moist Heat  13 Minutes    Moist Heat Location  Cervical      Electrical Stimulation   Electrical Stimulation Location  cervical     Electrical Stimulation Action  IFC    Electrical Stimulation Parameters  13 9 ma supine    Electrical Stimulation Goals  Pain      Manual Therapy   Manual therapy comments  suboccipital release, cranial circles    Soft tissue mobilization  bil cervical paraspinals, suboccipitals, upper traps    Manual Traction  3x 20 sec holds    Muscle Energy Technique  upper trap contract relax 3x 5 sec holds       Trigger Point Dry Needling - 10/11/17 1935    Consent Given?  Yes    Muscles Treated Upper Body  --   bil cervical paraspinals    Upper Trapezius Response  Twitch reponse elicited;Palpable increased muscle length    Oblique Capitus Response  Palpable increased muscle length    SubOccipitals Response  Palpable increased muscle length             PT Short Term Goals - 10/11/17 1942      PT SHORT TERM GOAL #1   Title  be independent in initial HEP    Status  Achieved      PT SHORT TERM GOAL #2   Title  report a 25% reduction in cervical pain with ADLs and turning head    Time  4    Period  Weeks    Status  On-going      PT SHORT TERM GOAL #3   Title  demonstrate correct posture and report corrections at home    Status  Achieved      PT SHORT TERM GOAL #4   Title  report a 30% reduction in the frequency and intensity of headaches    Time  4    Period  Weeks    Status  On-going      PT SHORT TERM GOAL #5   Title  demonstrate Lt shoulder IR lacking < or = to 5 inches vs the Rt to improve self-care    Time  4    Period  Weeks    Status  On-going        PT Long Term Goals -  09/01/17 1131      PT LONG TERM GOAL #1   Title  be independent in advanced HEP    Time  8    Period  Weeks    Status  New    Target Date   10/27/17      PT LONG TERM GOAL #2   Title  report a 60% reduction in neck pain with ADLs and turning head    Baseline  --    Time  8    Period  Weeks    Status  New    Target Date  10/27/17      PT LONG TERM GOAL #3   Title  report a 75% reduction in the frequency and intensity of neck pain and headaches    Time  8    Period  Weeks    Status  New    Target Date  10/27/17      PT LONG TERM GOAL #4   Title  demonstrate Rt cervical A/ROM rotation to 75 degrees to improve safety with driving    Baseline  --    Time  8    Period  Weeks    Status  New    Target Date  10/27/17      PT LONG TERM GOAL #5   Title  report < or = to 5/10 Lt shoulder pain with reaching and use with ADLs    Baseline  --    Time  8    Period  Weeks    Status  New    Target Date  10/27/17            Plan - 10/11/17 1936    Clinical Impression Statement  The patient reports a high stress level over the past few weeks with driving in heavy traffic and her friend's recent cancer diagnosis.  The stress aggravates her pain and she has numerous tender points especially in upper trap muscles.  Discussed practicing meditation and she would benefit from additional pain education strategies.  Good overall response to dry needling and significant reduction in number of tender points following DN and manual therapy.  Also with good pain relief with ES/heat.  Therapist closely monitoring response with all treatment interventions.  She has not met some STGs yet secondary to limited visits from being out of town.      Rehab Potential  Good    PT Frequency  2x / week    PT Duration  8 weeks    PT Treatment/Interventions  ADLs/Self Care Home Management;Cryotherapy;Electrical Stimulation;Traction;Moist Heat;Therapeutic activities;Therapeutic exercise;Patient/family education;Neuromuscular re-education;Manual techniques;Passive range of motion;Taping;Dry needling    PT Next Visit Plan  assess response to dry needling #3  to neck including suboccipitals for headaches, progress HEP as tolerated;  manual and heat/ES as needed; continue NU-Step       Patient will benefit from skilled therapeutic intervention in order to improve the following deficits and impairments:  Impaired flexibility, Decreased activity tolerance, Decreased endurance, Decreased range of motion, Decreased strength, Postural dysfunction, Increased muscle spasms, Improper body mechanics, Pain  Visit Diagnosis: Abnormal posture  Cervicalgia  Cramp and spasm     Problem List Patient Active Problem List   Diagnosis Date Noted  . Prediabetes 10/21/2016  . Headache 10/12/2016  . 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 Chronic pain syndrome 05/27/2016  . Osteoarthritis 05/16/2016  . Seasonal allergies 05/03/2016  . History of palpitations 09/16/2015  . Bipolar 1 disorder ([place]) 09/16/2015  .  History of breast cancer 11/16/2012  . Fibromyalgia 11/16/2012  . Smoker 11/16/2012  . INTERTRIGO 04/13/2010  . Hyperlipidemia 09/02/2006  . Depression 09/02/2006  . Essential hypertension 09/02/2006  . COPD (chronic obstructive pulmonary disease) ([place]) 09/02/2006  . GERD 09/02/2006   Ruben Im, PT 10/11/17 8:49 PM Phone: [phone] Fax: [phone] Alvera Singh 10/11/2017, 8:49 PM  [facility] [address] [place], [place], [address] Phone: [phone]   Fax:  [phone]  Name: TARNISHA KACHMAR MRN: [id number] Date of Birth: 12/09/1960

## 2017-10-13 ENCOUNTER — Ambulatory Visit: Payer: Medicare HMO | Admitting: Physical Therapy

## 2017-10-13 ENCOUNTER — Encounter: Payer: Self-pay | Admitting: Physical Therapy

## 2017-10-13 DIAGNOSIS — M6281 Muscle weakness (generalized): Secondary | ICD-10-CM | POA: Diagnosis not present

## 2017-10-13 DIAGNOSIS — M542 Cervicalgia: Secondary | ICD-10-CM | POA: Diagnosis not present

## 2017-10-13 DIAGNOSIS — R252 Cramp and spasm: Secondary | ICD-10-CM | POA: Diagnosis not present

## 2017-10-13 DIAGNOSIS — R293 Abnormal posture: Secondary | ICD-10-CM

## 2017-10-13 NOTE — Therapy (Signed)
[facility] [address], [place], [place], [address] Phone: [phone]   Fax:  [phone]  Physical Therapy Treatment  Patient Details  Name: Anita Arroyo MRN: [id number] Date of Birth: [birth year] Referring Provider: Edmonia Lynch, MD   Encounter Date: 10/13/2017  PT End of Session - 10/13/17 1604    Visit Number  5    Date for PT Re-Evaluation  10/27/17    Authorization Type  [employer]    PT Start Time  6962    PT Stop Time  1615   dry needling, heat   PT Time Calculation (min)  45 min    Activity Tolerance  Patient tolerated treatment well       Past Medical History:  Diagnosis Date  . Arthritis   . Asthma   . Bipolar 1 disorder ([place])   . Bipolar 1 disorder ([place])   . Breast cancer ([place])   . Depression   . Dysrhythmia    palpitations  . Dysrhythmia    atrial fibrillation  . Fibromyalgia   . Finger fracture 2011  . GERD (gastroesophageal reflux disease)   . Heart murmur   . History of stress test 04/28/2011   showed normal perfusion without scar or ischemia  . Hx of echocardiogram 04/28/2011   showed normal systolic function with mild diastolic dysfunction,she had trace MR but did not have frank mitral valve prolapse demonstrated. She had mild pulmonary hypertension with an estimated RV systolic pressure at 34 mm.  . Hyperlipemia   . Meniere disease   . MVP (mitral valve prolapse)   . Neuromuscular disorder ([place])    fibromyalgia    Past Surgical History:  Procedure Laterality Date  . ANKLE SURGERY     left x4  . BREAST SURGERY  2011   lumpectomy right breast   . COLONOSCOPY WITH PROPOFOL N/A 02/25/2014   Procedure: COLONOSCOPY WITH PROPOFOL;  Surgeon: Garlan Fair, MD;  Location: [facility];  Service: Endoscopy;  Laterality: N/A;  . deviated septum    . ECTOPIC PREGNANCY SURGERY    . ELBOW SURGERY     right elbow  . EYE SURGERY     cataract surgery  . fibroid     2-3 fibroid adenomas  removed  . JOINT REPLACEMENT  2011   right knee   . KNEE SURGERY     left knee  . SHOULDER ARTHROSCOPY WITH BICEPSTENOTOMY Right 09/25/2015   Procedure: SHOULDER ARTHROSCOPY WITH BICEPSTENOTOMY;  Surgeon: Ninetta Lights, MD;  Location: [place];  Service: Orthopedics;  Laterality: Right;  . SHOULDER ARTHROSCOPY WITH DISTAL CLAVICLE RESECTION Right 09/25/2015   Procedure: SHOULDER ARTHROSCOPY WITH DISTAL CLAVICLE RESECTION;  Surgeon: Ninetta Lights, MD;  Location: [place];  Service: Orthopedics;  Laterality: Right;  . SHOULDER ARTHROSCOPY WITH SUBACROMIAL DECOMPRESSION Right 09/25/2015   Procedure: RIGHT SHOULDER ARTHROSCOPY DEBRIDEMENT,ACROMIOPLASTY,DISTAL CLAVICAL EXCISION, RELEASE OF BICEPS TENDON;  Surgeon: Ninetta Lights, MD;  Location: [place];  Service: Orthopedics;  Laterality: Right;  . TMJ ARTHROPLASTY    . TONSILLECTOMY    . TOTAL SHOULDER ARTHROPLASTY Left 01/05/2016  . UTERINE FIBROID SURGERY     polups and fibroids removed   . WRIST SURGERY     left    There were no vitals filed for this visit.  Subjective Assessment - 10/13/17 1534    Subjective  Feeling OK.  Sore from DN but I thinks that's good. The patient reports her left  shoulder (that had the replacement in 2017) is "killing me."  The patient states she is doing her exercises at home regularly and is coming to PT "just for the dry needling."     Pertinent History  breast cancer, Lt shoulder replacement 12/2015, Rt TKA, hard of hearing    Currently in Pain?  Yes    Pain Score  5     Pain Location  Neck    Pain Orientation  Left;Right    Pain Type  Chronic pain                       [facility] Adult PT Treatment/Exercise - 10/13/17 0001      Exercises   Exercises  --   discussion on the importance of HEP for best longtermoutcome     Neck Exercises: Machines for Strengthening   Nustep  10 min L1    while discussing status and plan      Moist  Heat Therapy   Number Minutes Moist Heat  5 Minutes    Moist Heat Location  Cervical      Electrical Stimulation   Electrical Stimulation Location  cervical     Electrical Stimulation Action  Pre-mod with DN    Electrical Stimulation Parameters  1 ma 10 min prone     Electrical Stimulation Goals  Pain      Manual Therapy   Manual therapy comments  suboccipital release, cranial circles    Soft tissue mobilization  bil cervical paraspinals, suboccipitals, upper traps    Manual Traction  3x 20 sec holds    Muscle Energy Technique  upper trap contract relax 3x 5 sec holds       Trigger Point Dry Needling - 10/13/17 1603    Consent Given?  Yes    Muscles Treated Upper Body  --   bil cervical multfidi    Upper Trapezius Response  Twitch reponse elicited;Palpable increased muscle length    Oblique Capitus Response  Palpable increased muscle length    SubOccipitals Response  Twitch response elicited;Palpable increased muscle length             PT Short Term Goals - 10/11/17 1942      PT SHORT TERM GOAL #1   Title  be independent in initial HEP    Status  Achieved      PT SHORT TERM GOAL #2   Title  report a 25% reduction in cervical pain with ADLs and turning head    Time  4    Period  Weeks    Status  On-going      PT SHORT TERM GOAL #3   Title  demonstrate correct posture and report corrections at home    Status  Achieved      PT SHORT TERM GOAL #4   Title  report a 30% reduction in the frequency and intensity of headaches    Time  4    Period  Weeks    Status  On-going      PT SHORT TERM GOAL #5   Title  demonstrate Lt shoulder IR lacking < or = to 5 inches vs the Rt to improve self-care    Time  4    Period  Weeks    Status  On-going        PT Long Term Goals - 09/01/17 1131      PT LONG TERM GOAL #1   Title  be independent in advanced  HEP    Time  8    Period  Weeks    Status  New    Target Date  10/27/17      PT LONG TERM GOAL #2   Title   report a 60% reduction in neck pain with ADLs and turning head    Baseline  --    Time  8    Period  Weeks    Status  New    Target Date  10/27/17      PT LONG TERM GOAL #3   Title  report a 75% reduction in the frequency and intensity of neck pain and headaches    Time  8    Period  Weeks    Status  New    Target Date  10/27/17      PT LONG TERM GOAL #4   Title  demonstrate Rt cervical A/ROM rotation to 75 degrees to improve safety with driving    Baseline  --    Time  8    Period  Weeks    Status  New    Target Date  10/27/17      PT LONG TERM GOAL #5   Title  report < or = to 5/10 Lt shoulder pain with reaching and use with ADLs    Baseline  --    Time  8    Period  Weeks    Status  New    Target Date  10/27/17            Plan - 10/13/17 2056    Clinical Impression Statement  When I recommended waiting until the next visit for DN (as our usual protocol) the patient became angry stating, "well I might as well leave now!"  The patient seems to have the understanding that she could have DN twice a week for an unlimited time (usually 6x  is the norm for a typical body region).  I performed manual therapy and DN today and recommended she consider spreading out her visits to once a week.  Overall she has fewer tender points in paraspinals and suboccipitals.  Numerous tender points in upper traps especially on right.  Initiated ES with DN for further relief.      Rehab Potential  Good    PT Frequency  2x / week    PT Duration  8 weeks    PT Treatment/Interventions  ADLs/Self Care Home Management;Cryotherapy;Electrical Stimulation;Traction;Moist Heat;Therapeutic activities;Therapeutic exercise;Patient/family education;Neuromuscular re-education;Manual techniques;Passive range of motion;Taping;Dry needling    PT Next Visit Plan  assess response to dry needling #4 with added ES to multifidi;   Check progress with STGS;  progress HEP as tolerated;  manual and heat/ES as needed;  continue NU-Step    PT Home Exercise Plan   Access Code: [facility]        Patient will benefit from skilled therapeutic intervention in order to improve the following deficits and impairments:  Impaired flexibility, Decreased activity tolerance, Decreased endurance, Decreased range of motion, Decreased strength, Postural dysfunction, Increased muscle spasms, Improper body mechanics, Pain  Visit Diagnosis: Abnormal posture  Cervicalgia  Cramp and spasm     Problem List Patient Active Problem List   Diagnosis Date Noted  . Prediabetes 10/21/2016  . Headache 10/12/2016  . 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 Chronic pain syndrome 05/27/2016  . Osteoarthritis 05/16/2016  . Seasonal allergies 05/03/2016  . History of palpitations 09/16/2015  . Bipolar 1 disorder ([place]) 09/16/2015  . History of  breast cancer 11/16/2012  . Fibromyalgia 11/16/2012  . Smoker 11/16/2012  . INTERTRIGO 04/13/2010  . Hyperlipidemia 09/02/2006  . Depression 09/02/2006  . Essential hypertension 09/02/2006  . COPD (chronic obstructive pulmonary disease) ([place]) 09/02/2006  . GERD 09/02/2006   Ruben Im, PT 10/13/17 9:43 PM Phone: [phone] Fax: [phone] Alvera Singh 10/13/2017, 9:42 PM  [facility] [address] [place], [place], [address] Phone: [phone]   Fax:  [phone]  Name: Anita Arroyo MRN: [id number] Date of Birth: 12-02-1960

## 2017-10-18 ENCOUNTER — Encounter: Payer: Self-pay | Admitting: Physical Therapy

## 2017-10-18 ENCOUNTER — Ambulatory Visit: Payer: Medicare HMO | Admitting: Physical Therapy

## 2017-10-18 DIAGNOSIS — R252 Cramp and spasm: Secondary | ICD-10-CM

## 2017-10-18 DIAGNOSIS — M6281 Muscle weakness (generalized): Secondary | ICD-10-CM | POA: Diagnosis not present

## 2017-10-18 DIAGNOSIS — M542 Cervicalgia: Secondary | ICD-10-CM | POA: Diagnosis not present

## 2017-10-18 DIAGNOSIS — R293 Abnormal posture: Secondary | ICD-10-CM

## 2017-10-18 NOTE — Therapy (Signed)
[facility] [address] [place], [place], [address] Phone: [phone]   Fax:  [phone]  Physical Therapy Treatment  Patient Details  Name: Anita Arroyo MRN: [id number] Date of Birth: Jun 10, 1960 Referring Provider: Edmonia Lynch, MD   Encounter Date: 10/18/2017  PT End of Session - 10/18/17 1121    Visit Number  6    Date for PT Re-Evaluation  10/27/17    Authorization Type  [employer]    PT Start Time  1100    PT Stop Time  1140    PT Time Calculation (min)  40 min    Activity Tolerance  Patient tolerated treatment well       Past Medical History:  Diagnosis Date  . Arthritis   . Asthma   . Bipolar 1 disorder ([place])   . Bipolar 1 disorder ([place])   . Breast cancer ([place])   . Depression   . Dysrhythmia    palpitations  . Dysrhythmia    atrial fibrillation  . Fibromyalgia   . Finger fracture 2011  . GERD (gastroesophageal reflux disease)   . Heart murmur   . History of stress test 04/28/2011   showed normal perfusion without scar or ischemia  . Hx of echocardiogram 04/28/2011   showed normal systolic function with mild diastolic dysfunction,she had trace MR but did not have frank mitral valve prolapse demonstrated. She had mild pulmonary hypertension with an estimated RV systolic pressure at 34 mm.  . Hyperlipemia   . Meniere disease   . MVP (mitral valve prolapse)   . Neuromuscular disorder ([place])    fibromyalgia    Past Surgical History:  Procedure Laterality Date  . ANKLE SURGERY     left x4  . BREAST SURGERY  2011   lumpectomy right breast   . COLONOSCOPY WITH PROPOFOL N/A 02/25/2014   Procedure: COLONOSCOPY WITH PROPOFOL;  Surgeon: Garlan Fair, MD;  [facility];  Service: Endoscopy;  Laterality: N/A;  . deviated septum    . ECTOPIC PREGNANCY SURGERY    . ELBOW SURGERY     right elbow  . EYE SURGERY     cataract surgery  . fibroid     2-3 fibroid adenomas removed  . JOINT  REPLACEMENT  2011   right knee   . KNEE SURGERY     left knee  . SHOULDER ARTHROSCOPY WITH BICEPSTENOTOMY Right 09/25/2015   Procedure: SHOULDER ARTHROSCOPY WITH BICEPSTENOTOMY;  Surgeon: Ninetta Lights, MD;  Location: [place];  Service: Orthopedics;  Laterality: Right;  . SHOULDER ARTHROSCOPY WITH DISTAL CLAVICLE RESECTION Right 09/25/2015   Procedure: SHOULDER ARTHROSCOPY WITH DISTAL CLAVICLE RESECTION;  Surgeon: Ninetta Lights, MD;  Location: [place];  Service: Orthopedics;  Laterality: Right;  . SHOULDER ARTHROSCOPY WITH SUBACROMIAL DECOMPRESSION Right 09/25/2015   Procedure: RIGHT SHOULDER ARTHROSCOPY DEBRIDEMENT,ACROMIOPLASTY,DISTAL CLAVICAL EXCISION, RELEASE OF BICEPS TENDON;  Surgeon: Ninetta Lights, MD;  Location: [place];  Service: Orthopedics;  Laterality: Right;  . TMJ ARTHROPLASTY    . TONSILLECTOMY    . TOTAL SHOULDER ARTHROPLASTY Left 01/05/2016  . UTERINE FIBROID SURGERY     polups and fibroids removed   . WRIST SURGERY     left    There were no vitals filed for this visit.  Subjective Assessment - 10/18/17 1102    Subjective  That DN to the (suboccipital) region was fantastic.  My shoulder is better I think the dog jerking me.  Pertinent History  breast cancer, Lt shoulder replacement 12/2015, Rt TKA, hard of hearing    Patient Stated Goals  reduce neck pain, improve Lt shoulder pain    Currently in Pain?  Yes    Pain Score  4     Pain Location  Neck    Pain Orientation  Right;Left    Aggravating Factors   turning my head     Pain Relieving Factors  dry needling                        [facility] Adult PT Treatment/Exercise - 10/18/17 0001      Neck Exercises: Seated   Other Seated Exercise  headache SNAGs per HEP      Moist Heat Therapy   Number Minutes Moist Heat  10 Minutes    Moist Heat Location  Cervical      Electrical Stimulation   Electrical Stimulation Location  cervical      Electrical Stimulation Action  Pre-mod with DN     Electrical Stimulation Parameters  1.5 ma 10 min prone    Electrical Stimulation Goals  Pain      Manual Therapy   Manual therapy comments  suboccipital release, cranial circles    Soft tissue mobilization  bil cervical paraspinals, suboccipitals, upper traps    Manual Traction  3x 20 sec holds    Muscle Energy Technique  upper trap contract relax 3x 5 sec holds       Trigger Point Dry Needling - 10/18/17 1121    Consent Given?  Yes    Upper Trapezius Response  Twitch reponse elicited;Palpable increased muscle length    Oblique Capitus Response  Twitch response elicited;Palpable increased muscle length    SubOccipitals Response  Twitch response elicited;Palpable increased muscle length           PT Education - 10/18/17 1130    Education Details  headache SNAGS     Person(s) Educated  Patient    Methods  Explanation;Demonstration;Handout    Comprehension  Returned demonstration;Verbalized understanding       PT Short Term Goals - 10/18/17 1124      PT SHORT TERM GOAL #1   Title  be independent in initial HEP    Status  Achieved      PT SHORT TERM GOAL #2   Title  report a 25% reduction in cervical pain with ADLs and turning head    Time  4    Period  Weeks    Status  On-going      PT SHORT TERM GOAL #3   Title  demonstrate correct posture and report corrections at home    Status  Achieved      PT SHORT TERM GOAL #4   Title  report a 30% reduction in the frequency and intensity of headaches    Status  Achieved      PT SHORT TERM GOAL #5   Title  demonstrate Lt shoulder IR lacking < or = to 5 inches vs the Rt to improve self-care    Time  4    Period  Weeks    Status  On-going        PT Long Term Goals - 09/01/17 1131      PT LONG TERM GOAL #1   Title  be independent in advanced HEP    Time  8    Period  Weeks    Status  New  Target Date  10/27/17      PT LONG TERM GOAL #2   Title  report a 60%  reduction in neck pain with ADLs and turning head    Baseline  --    Time  8    Period  Weeks    Status  New    Target Date  10/27/17      PT LONG TERM GOAL #3   Title  report a 75% reduction in the frequency and intensity of neck pain and headaches    Time  8    Period  Weeks    Status  New    Target Date  10/27/17      PT LONG TERM GOAL #4   Title  demonstrate Rt cervical A/ROM rotation to 75 degrees to improve safety with driving    Baseline  --    Time  8    Period  Weeks    Status  New    Target Date  10/27/17      PT LONG TERM GOAL #5   Title  report < or = to 5/10 Lt shoulder pain with reaching and use with ADLs    Baseline  --    Time  8    Period  Weeks    Status  New    Target Date  10/27/17            Plan - 10/18/17 1122    Clinical Impression Statement  The patient reports good relief with DN and ES last visit especially in the suboccipital muscle region.  Much improved soft tissue mobility and decreased tender points compared to last week.  Added cervical SNAGS to HEP to address patient's primary complaint of upper neck tightness.  She reports she does her other exercises at home.  Therapist closely monitoring response with all interventions.     Rehab Potential  Good    PT Frequency  2x / week    PT Duration  8 weeks    PT Treatment/Interventions  ADLs/Self Care Home Management;Cryotherapy;Electrical Stimulation;Traction;Moist Heat;Therapeutic activities;Therapeutic exercise;Patient/family education;Neuromuscular re-education;Manual techniques;Passive range of motion;Taping;Dry needling    PT Next Visit Plan  assess response to dry needling #5 with added ES to multifidi;   progress HEP as tolerated;  manual and heat/ES as needed    PT Home Exercise Plan   Access Code: [facility]        Patient will benefit from skilled therapeutic intervention in order to improve the following deficits and impairments:  Impaired flexibility, Decreased activity tolerance,  Decreased endurance, Decreased range of motion, Decreased strength, Postural dysfunction, Increased muscle spasms, Improper body mechanics, Pain  Visit Diagnosis: Abnormal posture  Cervicalgia  Cramp and spasm  Muscle weakness (generalized)     Problem List Patient Active Problem List   Diagnosis Date Noted  . Prediabetes 10/21/2016  . Headache 10/12/2016  . 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 Chronic pain syndrome 05/27/2016  . Osteoarthritis 05/16/2016  . Seasonal allergies 05/03/2016  . History of palpitations 09/16/2015  . Bipolar 1 disorder ([place]) 09/16/2015  . History of breast cancer 11/16/2012  . Fibromyalgia 11/16/2012  . Smoker 11/16/2012  . INTERTRIGO 04/13/2010  . Hyperlipidemia 09/02/2006  . Depression 09/02/2006  . Essential hypertension 09/02/2006  . COPD (chronic obstructive pulmonary disease) ([place]) 09/02/2006  . GERD 09/02/2006   Ruben Im, PT 10/18/17 11:48 AM Phone: [phone] Fax: [phone]  Alvera Singh 10/18/2017, 11:47 AM  [facility] [address], [place], [place], [address] Phone: [phone]   Fax:  [phone]  Name: SUGEY TREVATHAN MRN: [id number] Date of Birth: 12/20/1960

## 2017-10-18 NOTE — Patient Instructions (Signed)
Anita Arroyo PT [facility] [address], [address] [place], [place] [address] Phone # [phone] Fax [phone]    

## 2017-10-20 ENCOUNTER — Ambulatory Visit: Payer: Medicare HMO | Admitting: Physical Therapy

## 2017-10-24 ENCOUNTER — Institutional Professional Consult (permissible substitution): Payer: Self-pay | Admitting: Internal Medicine

## 2017-10-24 ENCOUNTER — Institutional Professional Consult (permissible substitution): Payer: Self-pay | Admitting: Pulmonary Disease

## 2017-10-24 ENCOUNTER — Telehealth: Payer: Self-pay | Admitting: Acute Care

## 2017-10-24 DIAGNOSIS — F319 Bipolar disorder, unspecified: Secondary | ICD-10-CM | POA: Diagnosis not present

## 2017-10-24 NOTE — Telephone Encounter (Signed)
I have called the patient she had a consult schedule with MW but was unaware she was under the impression that she was seeing sarah for a lung caner screen apt.  I let her know that a doctor had referred her for a consult for eval. for  COPD.  She is going to call back to schedule and I will send this to  Ronalee Belts to schedule the lung scan.

## 2017-10-25 ENCOUNTER — Other Ambulatory Visit: Payer: Self-pay

## 2017-10-25 DIAGNOSIS — Z122 Encounter for screening for malignant neoplasm of respiratory organs: Secondary | ICD-10-CM

## 2017-10-25 DIAGNOSIS — F17210 Nicotine dependence, cigarettes, uncomplicated: Secondary | ICD-10-CM

## 2017-10-25 DIAGNOSIS — Z720 Tobacco use: Secondary | ICD-10-CM

## 2017-10-25 NOTE — Telephone Encounter (Signed)
Anita Arroyo, please let us know once updated patient getting LCS CT. I have called the patient about rescheduling the OV for COPD but was unable to reach. Left message to give [place] a call back.

## 2017-10-25 NOTE — Telephone Encounter (Signed)
Wells Guiles from Dr. Chales Abrahams office called me back and stated that the referral that they sent over to [place] back in 07/2017 was for COPD Consult which will need to be rescheduled again. I will try to get in contact with the patient to schedule her LCS CT because her last one was done 04/13/2016 and she is over due. Her SDMV was done 02/07/2015 and she just needs to continue the LCS CTs

## 2017-10-25 NOTE — Telephone Encounter (Signed)
I have called her PCPs office waiting for them to confirm what type of referral they sent for the patient to be seen

## 2017-10-25 NOTE — Addendum Note (Signed)
Addended by: Jannette Spanner on: 10/25/2017 03:54 PM   Modules accepted: Orders

## 2017-10-27 ENCOUNTER — Ambulatory Visit: Payer: Medicare HMO | Attending: Orthopedic Surgery

## 2017-10-27 DIAGNOSIS — G8929 Other chronic pain: Secondary | ICD-10-CM | POA: Diagnosis present

## 2017-10-27 DIAGNOSIS — R252 Cramp and spasm: Secondary | ICD-10-CM

## 2017-10-27 DIAGNOSIS — M25612 Stiffness of left shoulder, not elsewhere classified: Secondary | ICD-10-CM | POA: Insufficient documentation

## 2017-10-27 DIAGNOSIS — M6281 Muscle weakness (generalized): Secondary | ICD-10-CM | POA: Diagnosis present

## 2017-10-27 DIAGNOSIS — R293 Abnormal posture: Secondary | ICD-10-CM | POA: Diagnosis present

## 2017-10-27 DIAGNOSIS — M542 Cervicalgia: Secondary | ICD-10-CM | POA: Insufficient documentation

## 2017-10-27 DIAGNOSIS — M25512 Pain in left shoulder: Secondary | ICD-10-CM | POA: Insufficient documentation

## 2017-10-27 NOTE — Telephone Encounter (Signed)
Anita Arroyo has her LCS CT scheduled on 11/07/2017 @ 2:00pm. She also has her Consult appt scheduled for 11/03/2017 @ 11:30am with Dr. Melvyn Novas

## 2017-10-27 NOTE — Telephone Encounter (Signed)
Pt called in frustrated about having two "cancelled" appointments appear in her chart. Reassured pt regarding her upcoming appt with MW and how there is not a negative connotation with the appt cancellations. Advised pt just to keep her appt with MW on 11/03/17 and her LDCT on 11/07/17. Pt verbalized understanding and denied any further questions or concerns at this time.

## 2017-10-27 NOTE — Therapy (Signed)
[facility] [address] [place], [place], [address] Phone: [phone]   Fax:  [phone]  Physical Therapy Treatment  Patient Details  Name: Anita Arroyo MRN: [id number] Date of Birth: 07-10-1960 Referring Provider (PT): Edmonia Lynch, MD   Encounter Date: 10/27/2017  PT End of Session - 10/27/17 1315    Visit Number  7    Date for PT Re-Evaluation  12/08/17    Authorization Type  [employer]    PT Start Time  7782   portion of treatment was doing reassessment   PT Stop Time  1327    PT Time Calculation (min)  46 min    Activity Tolerance  Patient tolerated treatment well    Behavior During Therapy  [facility] for tasks assessed/performed       Past Medical History:  Diagnosis Date  . Arthritis   . Asthma   . Bipolar 1 disorder ([place])   . Bipolar 1 disorder ([place])   . Breast cancer ([place])   . Depression   . Dysrhythmia    palpitations  . Dysrhythmia    atrial fibrillation  . Fibromyalgia   . Finger fracture 2011  . GERD (gastroesophageal reflux disease)   . Heart murmur   . History of stress test 04/28/2011   showed normal perfusion without scar or ischemia  . Hx of echocardiogram 04/28/2011   showed normal systolic function with mild diastolic dysfunction,she had trace MR but did not have frank mitral valve prolapse demonstrated. She had mild pulmonary hypertension with an estimated RV systolic pressure at 34 mm.  . Hyperlipemia   . Meniere disease   . MVP (mitral valve prolapse)   . Neuromuscular disorder ([place])    fibromyalgia    Past Surgical History:  Procedure Laterality Date  . ANKLE SURGERY     left x4  . BREAST SURGERY  2011   lumpectomy right breast   . COLONOSCOPY WITH PROPOFOL N/A 02/25/2014   Procedure: COLONOSCOPY WITH PROPOFOL;  Surgeon: Garlan Fair, MD;  Location: [facility];  Service: Endoscopy;  Laterality: N/A;  . deviated septum    . ECTOPIC PREGNANCY SURGERY    . ELBOW SURGERY      right elbow  . EYE SURGERY     cataract surgery  . fibroid     2-3 fibroid adenomas removed  . JOINT REPLACEMENT  2011   right knee   . KNEE SURGERY     left knee  . SHOULDER ARTHROSCOPY WITH BICEPSTENOTOMY Right 09/25/2015   Procedure: SHOULDER ARTHROSCOPY WITH BICEPSTENOTOMY;  Surgeon: Ninetta Lights, MD;  Location: [place];  Service: Orthopedics;  Laterality: Right;  . SHOULDER ARTHROSCOPY WITH DISTAL CLAVICLE RESECTION Right 09/25/2015   Procedure: SHOULDER ARTHROSCOPY WITH DISTAL CLAVICLE RESECTION;  Surgeon: Ninetta Lights, MD;  Location: [place];  Service: Orthopedics;  Laterality: Right;  . SHOULDER ARTHROSCOPY WITH SUBACROMIAL DECOMPRESSION Right 09/25/2015   Procedure: RIGHT SHOULDER ARTHROSCOPY DEBRIDEMENT,ACROMIOPLASTY,DISTAL CLAVICAL EXCISION, RELEASE OF BICEPS TENDON;  Surgeon: Ninetta Lights, MD;  Location: [place];  Service: Orthopedics;  Laterality: Right;  . TMJ ARTHROPLASTY    . TONSILLECTOMY    . TOTAL SHOULDER ARTHROPLASTY Left 01/05/2016  . UTERINE FIBROID SURGERY     polups and fibroids removed   . WRIST SURGERY     left    There were no vitals filed for this visit.  Subjective Assessment - 10/27/17 1239    Subjective  Pt  reports 50% overall improvement in neck and Lt shoulder pain since the start of care.      Pertinent History  breast cancer, Lt shoulder replacement 12/2015, Rt TKA, hard of hearing    Currently in Pain?  Yes    Pain Score  4     Pain Location  Neck    Pain Orientation  Left;Right    Pain Descriptors / Indicators  Sore;Tightness    Pain Type  Chronic pain    Pain Onset  More than a month ago    Pain Frequency  Constant    Aggravating Factors   turning head    Pain Relieving Factors  dry needling, heat    Pain Score  3    Pain Location  Shoulder    Pain Orientation  Left    Pain Descriptors / Indicators  Tightness;Tender    Pain Type  Chronic pain    Pain Onset  More than a  month ago    Pain Frequency  Constant    Aggravating Factors   reaching behding the back, use of Lt UE    Pain Relieving Factors  not using the arm         [facility] PT Assessment - 10/27/17 0001      Assessment   Medical Diagnosis  Lt shoulder and neck pain    Referring Provider (PT)  Edmonia Lynch, MD      Cognition   Overall Cognitive Status  Within Functional Limits for tasks assessed      Observation/Other Assessments   Focus on Therapeutic Outcomes (FOTO)   41% limitation      AROM   Cervical Flexion  50    Cervical - Right Side Bend  45    Cervical - Left Side Bend  50    Cervical - Right Rotation  80    Cervical - Left Rotation  80                   [facility] Adult PT Treatment/Exercise - 10/27/17 0001      Neck Exercises: Machines for Strengthening   Nustep  Level 3 x 10 minutes   while discussing status and plan      Moist Heat Therapy   Number Minutes Moist Heat  15 Minutes    Moist Heat Location  Cervical      Electrical Stimulation   Electrical Stimulation Location  cervical     Electrical Stimulation Action  IFC    Electrical Stimulation Parameters  15 minutes    Electrical Stimulation Goals  Pain               PT Short Term Goals - 10/18/17 1124      PT SHORT TERM GOAL #1   Title  be independent in initial HEP    Status  Achieved      PT SHORT TERM GOAL #2   Title  report a 25% reduction in cervical pain with ADLs and turning head    Time  4    Period  Weeks    Status  On-going      PT SHORT TERM GOAL #3   Title  demonstrate correct posture and report corrections at home    Status  Achieved      PT SHORT TERM GOAL #4   Title  report a 30% reduction in the frequency and intensity of headaches    Status  Achieved      PT SHORT TERM  GOAL #5   Title  demonstrate Lt shoulder IR lacking < or = to 5 inches vs the Rt to improve self-care    Time  4    Period  Weeks    Status  On-going        PT Long Term Goals - 10/27/17  1249      PT LONG TERM GOAL #1   Title  be independent in advanced HEP    Time  6    Period  Weeks    Status  On-going    Target Date  12/08/17      PT LONG TERM GOAL #2   Title  report a 60% reduction in neck pain with ADLs and turning head    Baseline  50%     Time  6    Period  Weeks    Status  On-going      PT LONG TERM GOAL #3   Title  report a 75% reduction in the frequency and intensity of neck pain and headaches    Baseline  50% reduction    Time  6    Period  Weeks    Status  On-going    Target Date  12/08/17      PT LONG TERM GOAL #4   Title  demonstrate Rt cervical A/ROM rotation to 75 degrees to improve safety with driving    Baseline  80 degrees bil    Status  Achieved      PT LONG TERM GOAL #5   Title  report < or = to 5/10 Lt shoulder pain with reaching and use with ADLs    Baseline  2.5/10    Status  Achieved    Target Date  12/08/17            Plan - 10/27/17 1301    Clinical Impression Statement  Pt reports 50% overall improvement in neck pain and Lt shoulder pain since the start of care.  Pt with improved cervical A/ROM in all directions and 2.5/10 Lt shoulder pain with reaching overhead.  Pt with chronic neck pain and headaches and has consistent trigger points in the neck and upper back.  Treatment today focused on reassessment and pain management.  Pt will continue to benefit from skilled PT for 1-2 more dry needling sessions, HEP advancement and modalities for pain.      Rehab Potential  Good    PT Frequency  2x / week    PT Duration  8 weeks    PT Treatment/Interventions  ADLs/Self Care Home Management;Cryotherapy;Electrical Stimulation;Traction;Moist Heat;Therapeutic activities;Therapeutic exercise;Patient/family education;Neuromuscular re-education;Manual techniques;Passive range of motion;Taping;Dry needling    PT Next Visit Plan  1-2 more dry needling sessions.  Progress HEP, conitnue to NuStep to imrpove UE mobility and overall endurance     PT Home Exercise Plan   Access Code: [email or web address]     Recommended Other Services  initial certification is signed    Consulted and Agree with Plan of Care  Patient       Patient will benefit from skilled therapeutic intervention in order to improve the following deficits and impairments:  Impaired flexibility, Decreased activity tolerance, Decreased endurance, Decreased range of motion, Decreased strength, Postural dysfunction, Increased muscle spasms, Improper body mechanics, Pain  Visit Diagnosis: Abnormal posture - Plan: PT plan of care cert/re-cert  Cervicalgia - Plan: PT plan of care cert/re-cert  Cramp and spasm - Plan: PT plan of care cert/re-cert  Muscle weakness (generalized) - Plan: PT plan  of care cert/re-cert  Chronic left shoulder pain - Plan: PT plan of care cert/re-cert  Stiffness of left shoulder, not elsewhere classified - Plan: PT plan of care cert/re-cert     Problem List Patient Active Problem List   Diagnosis Date Noted  . Prediabetes 10/21/2016  . Headache 10/12/2016  . 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 Chronic pain syndrome 05/27/2016  . Osteoarthritis 05/16/2016  . Seasonal allergies 05/03/2016  . History of palpitations 09/16/2015  . Bipolar 1 disorder ([place]) 09/16/2015  . History of breast cancer 11/16/2012  . Fibromyalgia 11/16/2012  . Smoker 11/16/2012  . INTERTRIGO 04/13/2010  . Hyperlipidemia 09/02/2006  . Depression 09/02/2006  . Essential hypertension 09/02/2006  . COPD (chronic obstructive pulmonary disease) ([place]) 09/02/2006  . GERD 09/02/2006     Sigurd Sos, PT 10/27/17 1:20 PM  [facility] [address] [place], [place], [address] Phone: [phone]   Fax:  [phone]  Name: BARBIE CROSTON MRN: [id number] Date of Birth: August 08, 1960

## 2017-11-03 ENCOUNTER — Encounter: Payer: Self-pay | Admitting: Internal Medicine

## 2017-11-03 ENCOUNTER — Ambulatory Visit: Payer: Medicare HMO | Admitting: Internal Medicine

## 2017-11-03 VITALS — BP 118/66 | HR 70 | Ht 62.5 in | Wt 168.0 lb

## 2017-11-03 DIAGNOSIS — F17210 Nicotine dependence, cigarettes, uncomplicated: Secondary | ICD-10-CM | POA: Diagnosis not present

## 2017-11-03 DIAGNOSIS — R0609 Other forms of dyspnea: Secondary | ICD-10-CM | POA: Insufficient documentation

## 2017-11-03 MED ORDER — BUDESONIDE-FORMOTEROL FUMARATE 80-4.5 MCG/ACT IN AERO: 2.0000 | INHALATION_SPRAY | Freq: Two times a day (BID) | RESPIRATORY_TRACT | 0 refills | Status: DC

## 2017-11-03 MED ORDER — BUDESONIDE-FORMOTEROL FUMARATE 80-4.5 MCG/ACT IN AERO: 2.0000 | INHALATION_SPRAY | Freq: Two times a day (BID) | RESPIRATORY_TRACT | 3 refills | Status: DC

## 2017-11-03 MED ORDER — BUDESONIDE-FORMOTEROL FUMARATE 80-4.5 MCG/ACT IN AERO: 2.0000 | INHALATION_SPRAY | Freq: Two times a day (BID) | RESPIRATORY_TRACT | 11 refills | Status: DC

## 2017-11-03 NOTE — Progress Notes (Signed)
Subjective:    Patient ID: Anita Arroyo, female    DOB: [birth year]     MRN: [id number]  HPI 28  yowf active smoker healthy childhood in 45s started noticing sniffles/ drainage  Spring/ fall > eval by [facility] ? Grass/ pollen/ Cats ? Better on shots  around 2017 intermittent cough not related rhinitis flared with uri's  rx advair/ singulair/ albuterol some better but with progressive worse doe  X walking dogs so referred to pulmonary clinic 11/03/2017 by Dr   Coletta Memos for ? Copd but spirometry nl at initial ov    11/03/2017   Pulmonary / 1st eval  Chief Complaint  Patient presents with  . Consult    Referred by Dr. Coletta Memos for COPD.    Dyspnea:  MMRC3 = can't walk 100 yards even at a slow pace at a flat grade s stopping due to sob   Cough: am's worst x tbsp or two thick all different colors Sleeping: bed flat x 1pillow  SABA use: typical day once every few weeks  02: no    No obvious day to day or daytime variability or assoc excess/ purulent sputum or mucus plugs or hemoptysis or cp or chest tightness, subjective wheeze or overt sinus or hb symptoms.   96 without nocturnal  or early am exacerbation  of respiratory  c/o's or need for noct saba. Also denies any obvious fluctuation of symptoms with weather or environmental changes or other aggravating or alleviating factors except as outlined above   No unusual exposure hx or h/o childhood pna/ asthma or knowledge of premature birth.  Current Allergies, Complete Past Medical History, Past Surgical History, Family History, and Social History were reviewed in [employer] record.     Current Meds  Medication Sig  . albuterol (PROVENTIL HFA;VENTOLIN HFA) 108 (90 Base) MCG/ACT inhaler Inhale 2 puffs every 4 (four) hours as needed into the lungs for wheezing or shortness of breath (cough, shortness of breath or wheezing.).  Marland Kitchen Alcohol Swabs (B-D SINGLE USE SWABS REGULAR) PADS   . atenolol (TENORMIN) 50 MG tablet Take 50 mg  in the morning and 25 mg in the evening  . Biotin (BIOTIN 5000) 5 MG CAPS Take 5 mg by mouth daily.  . blood glucose meter kit and supplies KIT Dispense based on patient and insurance preference. Use daily as directed. (FOR ICD-9 250.00, 250.01).  . cetirizine (ZYRTEC) 10 MG tablet Take 10 mg by mouth daily.   . diclofenac sodium (VOLTAREN) 1 % GEL   . fluticasone (FLONASE) 50 MCG/ACT nasal spray USE 1 SPRAY IN EACH NOSTRIL TWICE DAILY  . Fluticasone-Salmeterol (ADVAIR) 250-50 MCG/DOSE AEPB Inhale 1 puff into the lungs 2 (two) times daily.  Marland Kitchen HYDROcodone-acetaminophen (NORCO/VICODIN) 5-325 MG tablet take 1 TABLET BY MOUTH EVERY 6 HOURS AS NEEDED for moderate pain  . L-Methylfolate (DEPLIN PO) Take 1 tablet by mouth daily.  Marland Kitchen lamoTRIgine (LAMICTAL) 150 MG tablet Take 1 tablet (150 mg total) by mouth 2 (two) times daily. (Patient taking differently: Take 200 mg by mouth 2 (two) times daily. )  . Magnesium 250 MG TABS Take 250 mg by mouth daily.  . meclizine (ANTIVERT) 25 MG tablet Take 1 tablet (25 mg total) by mouth as needed for dizziness.  . montelukast (SINGULAIR) 10 MG tablet Take 1 tablet (10 mg total) at bedtime by mouth.  . pantoprazole (PROTONIX) 40 MG tablet Take 1 tablet (40 mg total) by mouth daily.  . traZODone (DESYREL) 100 MG  tablet Take 2 tablets (200 mg total) by mouth at bedtime.  . triamterene-hydrochlorothiazide (MAXZIDE-25) 37.5-25 MG tablet Take 0.5 tablets daily by mouth.  . TRUE METRIX BLOOD GLUCOSE TEST test strip   . TRUEPLUS LANCETS 33G MISC   . venlafaxine XR (EFFEXOR-XR) 150 MG 24 hr capsule Take 1 capsule (150 mg total) by mouth at bedtime. (Patient taking differently: Take 150 mg by mouth daily with breakfast. )  . [DISCONTINUED] HYDROcodone-acetaminophen (NORCO/VICODIN) 5-325 MG tablet Take 1 tablet by mouth every 6 (six) hours as needed for moderate pain.       Review of Systems  Constitutional: Negative for fever and unexpected weight change.  HENT: Negative  for congestion, dental problem, ear pain, nosebleeds, postnasal drip, rhinorrhea, sinus pressure, sneezing, sore throat and trouble swallowing.   Eyes: Negative for redness and itching.  Respiratory: Positive for cough, chest tightness and shortness of breath. Negative for wheezing.   Cardiovascular: Negative for palpitations and leg swelling.  Gastrointestinal: Negative for nausea and vomiting.  Genitourinary: Negative for dysuria.  Musculoskeletal: Negative for joint swelling.  Skin: Negative for rash.  Neurological: Negative for headaches.  Hematological: Does not bruise/bleed easily.  Psychiatric/Behavioral: Negative for dysphoric mood. The patient is not nervous/anxious.        Objective:   Physical Exam  amb obese anxious  wf nad occ sniffles    Wt Readings from Last 3 Encounters:  11/03/17 168 lb (76.2 kg)  06/24/17 162 lb (73.5 kg)  01/28/17 170 lb 6.4 oz (77.3 kg)     Vital signs reviewed - Note on arrival 02 sats  96% on RA    HEENT: nl dentition, turbinates bilaterally, and oropharynx. Nl external ear canals without cough reflex   NECK :  without JVD/Nodes/TM/ nl carotid upstrokes bilaterally   LUNGS: no acc muscle use,  Nl contour chest which is clear to A and P bilaterally without cough on insp or exp maneuvers   CV:  RRR  no s3 or murmur or increase in P2, and no edema   ABD:  Obese/ soft and nontender with nl inspiratory excursion in the supine position. No bruits or organomegaly appreciated, bowel sounds nl  MS:  Walks with limp/  ext warm without deformities, calf tenderness, cyanosis or clubbing No obvious joint restrictions   SKIN: warm and dry without lesions    NEURO:  alert, approp, nl sensorium with  no motor or cerebellar deficits apparent.       scheduled for screening LDCT 11/07/17 so cxr not done 11/03/2017       Assessment & Plan:

## 2017-11-03 NOTE — Patient Instructions (Addendum)
Plan A = Automatic = Symbicort 80 Take 2 puffs first thing in am and then another 2 puffs about 12 hours later.   Work on inhaler technique:  relax and gently blow all the way out then take a nice smooth deep breath back in, triggering the inhaler at same time you start breathing in.  Hold for up to 5 seconds if you can. Blow out thru nose. Rinse and gargle with water when done      Plan B = Backup Only use your albuterol as a rescue medication to be used if you can't catch your breath by resting or doing a relaxed purse lip breathing pattern.  - The less you use it, the better it will work when you need it. - Ok to use the inhaler up to 2 puffs  every 4 hours if you must but call for appointment if use goes up over your usual need - Don't leave home without it !!  (think of it like the spare tire for your car)    The key is to stop smoking completely before smoking completely stops you!    Please schedule a follow up visit in 3 months but call sooner if needed with full pfts

## 2017-11-04 ENCOUNTER — Encounter: Payer: Self-pay | Admitting: Internal Medicine

## 2017-11-04 NOTE — Assessment & Plan Note (Addendum)
11/03/2017  Walked RA x 3 laps @ 185 ft each stopped due to  End of study/ moderate pace with limp, sats 91% at end  - Spirometry 11/03/2017  FEV1 2.1 (86%)  Ratio 80  nl curvature p advair 260 in am  - 11/03/2017  After extensive coaching inhaler device,  effectiveness =    75% > try symb 80 2bid     Symptoms are markedly disproportionate to objective findings and not clear to what extent this is actually a pulmonary  problem but pt does appear to have difficult to sort out respiratory symptoms of unknown origin for which  DDX  = almost all start with A and  include Adherence, Ace Inhibitors, Acid Reflux, Active Sinus Disease, Alpha 1 Antitripsin deficiency, Anxiety masquerading as Airways dz,  ABPA,  Allergy(esp in young), Aspiration (esp in elderly), Adverse effects of meds,  Active smoking or Vaping, A bunch of PE's/clot burden (a few small clots can't cause this syndrome unless there is already severe underlying pulm or vascular dz with poor reserve),  Anemia or thyroid disorder, plus two Bs  = Bronchiectasis and Beta blocker use..and one C= CHF     Adherence is always the initial "prime suspect" and is a multilayered concern that requires a "trust but verify" approach in every patient - starting with knowing how to use medications, especially inhalers, correctly, keeping up with refills and understanding the fundamental difference between maintenance and prns vs those medications only taken for a very short course and then stopped and not refilled.  - see hfa teaching - return with all meds in hand using a trust but verify approach to confirm accurate Medication  Reconciliation The principal here is that until we are certain that the  patients are doing what we've asked, it makes no sense to ask them to do more.    Active smoking greatest concern > see sep a/p  ? Adverse effects of advair dpi > try off    ? Allergy/ asthma but clearly this is not copd> try symb 80 2bid for now and d/c advair  as above   ? Acid (or non-acid) GERD > always difficult to exclude as up to 75% of pts in some series report no assoc GI/ Heartburn symptoms> rec continue max (24h)  acid suppression and diet restrictions/ reviewed      ? Anxiety/depression/ deconditioning > usually at the bottom of this list of usual suspects but should be higher on this pt's based on H and P and note already on psychotropics and may interfere with adherence and also interpretation of response or lack thereof to symptom management which can be quite subjective.   ? Beta blocker effects > unlikely given today's spirometry > ok to continue tenormin     Will regroup in 12 weeks with full pfts

## 2017-11-04 NOTE — Assessment & Plan Note (Signed)
4-5 min discussion re active cigarette smoking in addition to office E&M  Ask about tobacco use:   Ongoing/ can't afford chantix  Advise quitting:    I reviewed the Fletcher curve with the patient that basically indicates  if you quit smoking when your best day FEV1 is still well preserved (as is clearly  the case here)  it is highly unlikely you will progress to severe disease and informed the patient there was  no medication on the market that has proven to alter the curve/ its downward trajectory  or the likelihood of progression of their disease(unlike other chronic medical conditions such as atheroclerosis where we do think we can change the natural hx with risk reducing meds)    Therefore stopping smoking and maintaining abstinence are  the most important aspects of her care, not choice of inhalers or for that matter, doctors.  Treatment other than smoking cessation  is entirely directed by severity of symptoms and focused also on reducing exacerbations, not attempting to change the natural history of the disease.   Assess willingness:  Not committed at this point as thinks chantix costs more than cigs and hasn't figured in all the indirect costs of smoking  Assist in quit attempt:  Per PCP when ready Arrange follow up:   Follow up per Primary Care planned       Total time devoted to counseling  > 50 % of initial 60 min office visit:  review case with pt/ discussion of options/alternatives/ personally creating written customized instructions  in presence of pt  then going over those specific  Instructions directly with the pt including how to use all of the meds but in particular covering each new medication in detail and the difference between the maintenance= "automatic" meds and the prns using an action plan format for the latter (If this problem/symptom => do that organization reading Left to right).  Please see AVS from this visit for a full list of these instructions which I personally wrote  for this pt and  are unique to this visit.    See device teaching which extended face to face time for this visit

## 2017-11-06 NOTE — Progress Notes (Signed)
Cardiology Office Note   Date:  11/07/2017   ID:  Anita Arroyo, DOB 1960/08/09, MRN [id number]  PCP:  Harrison Mons, [place]  Cardiologist: Dr. Claiborne Billings Chief Complaint  Patient presents with  . Follow-up  . Hyperlipidemia  . Hypertension     History of Present Illness: Anita Arroyo is a 57 y.o. female who presents for ongoing assessment and management of chronic dyspnea, with history of grade 1 diastolic dysfunction, trace mitral regurg, and pulmonary hypertension.  She also has a history of hyperlipidemia.  She was recommended for aggressive lipid management with a target LDL less than 70, and on last office visit had not been taking statin consistently.  She was complaining of myalgia pain but also has a history of fibromyalgia.  The patient was advised to take Crestor 3 times a week for 2 weeks and then every other day.    Unfortunately, it was noted that the patient continued to smoke.  Most recent CT scan in March 2018 showed right lower lobe pulmonary nodule.  Most recent echocardiogram in June 2018 revealed a normal LV systolic function of 44% to 65%.  Nuclear perfusion study on 08/12/2016 was normal without scar or ischemia.  On last office visit Dr. Claiborne Billings reduce Maxide from daily to every other day or 1/2 pill daily, and atenolol was reduced from 50 mg twice daily to 50 mg in the a.m. and 25 mg in the p.m.  She was advised to try taking coenzyme Q 10 to help with myalgia pain.  She is doing well today. She states that she stopped taking Crestor as this caused worsening depression. Once she stopped this, she began to feel better. She has tried Zetia, and atorvastatin but was unable to tolerate this. Otherwise she has been medically compliant. Unfortunately continues to smoke. Sees Dr. Melvyn Novas, pulmonology and has been started on Symbicort.   Past Medical History:  Diagnosis Date  . Arthritis   . Asthma   . Bipolar 1 disorder ([place])   . Bipolar 1 disorder ([place])   . Breast cancer ([place])   .  Depression   . Dysrhythmia    palpitations  . Dysrhythmia    atrial fibrillation  . Fibromyalgia   . Finger fracture 2011  . GERD (gastroesophageal reflux disease)   . Heart murmur   . History of stress test 04/28/2011   showed normal perfusion without scar or ischemia  . Hx of echocardiogram 04/28/2011   showed normal systolic function with mild diastolic dysfunction,she had trace MR but did not have frank mitral valve prolapse demonstrated. She had mild pulmonary hypertension with an estimated RV systolic pressure at 34 mm.  . Hyperlipemia   . Meniere disease   . MVP (mitral valve prolapse)   . Neuromuscular disorder ([place])    fibromyalgia    Past Surgical History:  Procedure Laterality Date  . ANKLE SURGERY     left x4  . BREAST SURGERY  2011   lumpectomy right breast   . COLONOSCOPY WITH PROPOFOL N/A 02/25/2014   Procedure: COLONOSCOPY WITH PROPOFOL;  Surgeon: Garlan Fair, MD;  Location: [facility];  Service: Endoscopy;  Laterality: N/A;  . deviated septum    . ECTOPIC PREGNANCY SURGERY    . ELBOW SURGERY     right elbow  . EYE SURGERY     cataract surgery  . fibroid     2-3 fibroid adenomas removed  . JOINT REPLACEMENT  2011   right knee   . KNEE  SURGERY     left knee  . SHOULDER ARTHROSCOPY WITH BICEPSTENOTOMY Right 09/25/2015   Procedure: SHOULDER ARTHROSCOPY WITH BICEPSTENOTOMY;  Surgeon: Ninetta Lights, MD;  Location: [place];  Service: Orthopedics;  Laterality: Right;  . SHOULDER ARTHROSCOPY WITH DISTAL CLAVICLE RESECTION Right 09/25/2015   Procedure: SHOULDER ARTHROSCOPY WITH DISTAL CLAVICLE RESECTION;  Surgeon: Ninetta Lights, MD;  Location: [place];  Service: Orthopedics;  Laterality: Right;  . SHOULDER ARTHROSCOPY WITH SUBACROMIAL DECOMPRESSION Right 09/25/2015   Procedure: RIGHT SHOULDER ARTHROSCOPY DEBRIDEMENT,ACROMIOPLASTY,DISTAL CLAVICAL EXCISION, RELEASE OF BICEPS TENDON;  Surgeon: Ninetta Lights, MD;  Location:  [place];  Service: Orthopedics;  Laterality: Right;  . TMJ ARTHROPLASTY    . TONSILLECTOMY    . TOTAL SHOULDER ARTHROPLASTY Left 01/05/2016  . UTERINE FIBROID SURGERY     polups and fibroids removed   . WRIST SURGERY     left     Current Outpatient Medications  Medication Sig Dispense Refill  . albuterol (PROVENTIL HFA;VENTOLIN HFA) 108 (90 Base) MCG/ACT inhaler Inhale 2 puffs every 4 (four) hours as needed into the lungs for wheezing or shortness of breath (cough, shortness of breath or wheezing.). 3 Inhaler 0  . Alcohol Swabs (B-D SINGLE USE SWABS REGULAR) PADS     . atenolol (TENORMIN) 50 MG tablet Take 50 mg in the morning and 25 mg in the evening 135 tablet 1  . Biotin (BIOTIN 5000) 5 MG CAPS Take 5 mg by mouth daily.    . blood glucose meter kit and supplies KIT Dispense based on patient and insurance preference. Use daily as directed. (FOR ICD-9 250.00, 250.01). 1 each 0  . budesonide-formoterol (SYMBICORT) 80-4.5 MCG/ACT inhaler Inhale 2 puffs into the lungs 2 (two) times daily. 1 Inhaler 11  . cetirizine (ZYRTEC) 10 MG tablet Take 10 mg by mouth daily.     . diclofenac sodium (VOLTAREN) 1 % GEL     . fluticasone (FLONASE) 50 MCG/ACT nasal spray USE 1 SPRAY IN EACH NOSTRIL TWICE DAILY 48 g 1  . HYDROcodone-acetaminophen (NORCO/VICODIN) 5-325 MG tablet take 1 TABLET BY MOUTH EVERY 6 HOURS AS NEEDED for moderate pain 120 tablet 0  . L-Methylfolate (DEPLIN PO) Take 1 tablet by mouth daily.    Marland Kitchen lamoTRIgine (LAMICTAL) 150 MG tablet Take 1 tablet (150 mg total) by mouth 2 (two) times daily. (Patient taking differently: Take 200 mg by mouth 2 (two) times daily. ) 60 tablet 0  . Magnesium 250 MG TABS Take 250 mg by mouth daily.    . meclizine (ANTIVERT) 25 MG tablet Take 1 tablet (25 mg total) by mouth as needed for dizziness. 30 tablet 1  . montelukast (SINGULAIR) 10 MG tablet Take 1 tablet (10 mg total) at bedtime by mouth. 90 tablet 3  . pantoprazole (PROTONIX) 40 MG  tablet Take 1 tablet (40 mg total) by mouth daily. 90 tablet 1  . traZODone (DESYREL) 100 MG tablet Take 2 tablets (200 mg total) by mouth at bedtime. 60 tablet 0  . triamterene-hydrochlorothiazide (MAXZIDE-25) 37.5-25 MG tablet Take 0.5 tablets daily by mouth. 90 tablet 3  . TRUE METRIX BLOOD GLUCOSE TEST test strip     . TRUEPLUS LANCETS 33G MISC     . venlafaxine XR (EFFEXOR-XR) 150 MG 24 hr capsule Take 1 capsule (150 mg total) by mouth at bedtime. (Patient taking differently: Take 150 mg by mouth daily with breakfast. ) 30 capsule 0   No current facility-administered medications for this  visit.     Allergies:   Contrast media [iodinated diagnostic agents]; Iodine; Adhesive [tape]; Celecoxib; Statins; Ceclor [cefaclor]; Moxifloxacin; and Sulfonamide derivatives    Social History:  The patient  reports that she has been smoking cigarettes. She has a 60.00 pack-year smoking history. She has never used smokeless tobacco. She reports that she drinks alcohol. She reports that she does not use drugs.   Family History:  The patient's family history includes Cancer in her mother; Heart Problems in her mother; Mitral valve prolapse in her mother.    ROS: All other systems are reviewed and negative. Unless otherwise mentioned in H&P    PHYSICAL EXAM: VS:  BP 122/66   Pulse 67   Ht 5' 2.5" (1.588 m)   Wt 168 lb (76.2 kg)   LMP 06/25/2011   SpO2 96%   BMI 30.24 kg/m  , BMI Body mass index is 30.24 kg/m. GEN: Well nourished, well developed, in no acute distress HEENT: normal Neck: no JVD, carotid bruits, or masses Cardiac: RRR; no murmurs, rubs, or gallops,no edema  Respiratory:  Clear to auscultation bilaterally, normal work of breathing GI: soft, nontender, nondistended, + BS MS: no deformity or atrophy Skin: warm and dry, no rash Neuro:  Strength and sensation are intact Psych: euthymic mood, full affect   EKG:  NSR incomplete RBBB, rate of 67 bpm.   Recent Labs: 01/28/2017: ALT  21; BUN 16; Creatinine, Ser 0.92; Potassium 4.3; Sodium 141    Lipid Panel    Component Value Date/Time   CHOL 250 (H) 08/12/2016 1659   TRIG 134 08/12/2016 1659   HDL 61 08/12/2016 1659   CHOLHDL 4.1 08/12/2016 1659   CHOLHDL 2.9 11/22/2013 0904   VLDL 16 11/22/2013 0904   LDLCALC 162 (H) 08/12/2016 1659      Wt Readings from Last 3 Encounters:  11/07/17 168 lb (76.2 kg)  11/03/17 168 lb (76.2 kg)  06/24/17 162 lb (73.5 kg)      Other studies Reviewed: Echo 07/08/2016  Left ventricle: The cavity size was normal. Wall thickness was   normal. Systolic function was normal. The estimated ejection   fraction was in the range of 60% to 65%. Doppler parameters are   consistent with abnormal left ventricular relaxation (grade 1   diastolic dysfunction).  ASSESSMENT AND PLAN:  1.  Hypercholesterolemia: She has been intolerant to rosuvastatin and atorvastatin, along with Zetia. I am referring her to Pharm-D for evaluation and recommendations for Repatha. I have suggested this to the patient who is willing to try this treatment. Most recent LDL listed as 162, with TC 250, and HDL 61. Goal of LDL , 70. Creatinine 0.92.  Will likely need repeat labs. Will defer to Pharmacy for this.      2.  Chronic Dyspnea: Followed by Dr. Melvyn Novas and recently started on Symbicort. She states this has been helpful.   3. Hypertension: Tolerating lower dose of atenolol to 25 mg in the pm and 50 mg in the am, along with daily 1/2 tablet of Maxzide without dizziness or near syncope.   4.Tobacco abuse: Smoking cessation is strongly recommended.    Current medicines are reviewed at length with the patient today.    Labs/ tests ordered today include:None will need labs when seen by Lanell Persons. [place] Pugh, ANP, AACC   11/07/2017 4:30 PM    [facility] [place] [address] Office [phone] Fax [phone]

## 2017-11-07 ENCOUNTER — Encounter: Payer: Self-pay | Admitting: Adult Health

## 2017-11-07 ENCOUNTER — Ambulatory Visit: Payer: Medicare HMO | Admitting: Adult Health

## 2017-11-07 ENCOUNTER — Ambulatory Visit (INDEPENDENT_AMBULATORY_CARE_PROVIDER_SITE_OTHER)
Admission: RE | Admit: 2017-11-07 | Discharge: 2017-11-07 | Disposition: A | Discharge status: Home / Self Care | Payer: Medicare HMO | Source: Ambulatory Visit | Attending: Acute Care | Admitting: Acute Care

## 2017-11-07 VITALS — BP 122/66 | HR 67 | Ht 62.5 in | Wt 168.0 lb

## 2017-11-07 DIAGNOSIS — Z79899 Other long term (current) drug therapy: Secondary | ICD-10-CM

## 2017-11-07 DIAGNOSIS — E7800 Pure hypercholesterolemia, unspecified: Secondary | ICD-10-CM

## 2017-11-07 DIAGNOSIS — I10 Essential (primary) hypertension: Secondary | ICD-10-CM | POA: Diagnosis not present

## 2017-11-07 DIAGNOSIS — R0602 Shortness of breath: Secondary | ICD-10-CM | POA: Diagnosis not present

## 2017-11-07 DIAGNOSIS — F17210 Nicotine dependence, cigarettes, uncomplicated: Secondary | ICD-10-CM | POA: Diagnosis not present

## 2017-11-07 DIAGNOSIS — Z789 Other specified health status: Secondary | ICD-10-CM

## 2017-11-07 DIAGNOSIS — Z720 Tobacco use: Secondary | ICD-10-CM

## 2017-11-07 NOTE — Addendum Note (Signed)
Addended by: Waylan Rocher on: 11/07/2017 04:36 PM   Modules accepted: Orders

## 2017-11-07 NOTE — Patient Instructions (Signed)
Follow-Up: Your provider would like you to have an appointment:WITH PHARMACIST TO [place] will need a follow up appointment in 3-4 months.  You may see  DR KELLY ONLY.   Medication Instructions:  NO CHANGES- Your physician recommends that you continue on your current medications as directed. Please refer to the Current Medication list given to you today.  If you need a refill on your cardiac medications before your next appointment, please call your pharmacy.  Labwork:  If you have labs (blood work) drawn today and your tests are completely normal, you will receive your results only by: Marland Kitchen MyChart Message (if you have MyChart) OR . A paper copy in the mail If you have any lab test that is abnormal or we need to change your treatment, we will call you to review the results.  At [facility], you and your health needs are our priority.  As part of our continuing mission to provide you with exceptional heart care, we have created designated Provider Care Teams.  These Care Teams include your primary Cardiologist (physician) and Advanced Practice Providers (APPs -  Physician Assistants and Nurse Practitioners) who all work together to provide you with the care you need, when you need it.  Thank you for choosing [facility] at [facility]!!

## 2017-11-09 ENCOUNTER — Ambulatory Visit: Payer: Medicare HMO

## 2017-11-09 DIAGNOSIS — R293 Abnormal posture: Secondary | ICD-10-CM

## 2017-11-09 DIAGNOSIS — M542 Cervicalgia: Secondary | ICD-10-CM

## 2017-11-09 DIAGNOSIS — R252 Cramp and spasm: Secondary | ICD-10-CM

## 2017-11-09 DIAGNOSIS — M6281 Muscle weakness (generalized): Secondary | ICD-10-CM

## 2017-11-09 NOTE — Therapy (Signed)
[facility] [address], [place], [place], [address] Phone: [phone]   Fax:  [phone]  Physical Therapy Treatment  Patient Details  Name: Anita Arroyo MRN: [id number] Date of Birth: [birth year] Referring Provider (PT): Edmonia Lynch, MD   Encounter Date: 11/09/2017  PT End of Session - 11/09/17 1704    Visit Number  8    Date for PT Re-Evaluation  12/08/17    Authorization Type  [employer]    PT Start Time  1619    PT Stop Time  1710    PT Time Calculation (min)  51 min    Activity Tolerance  Patient tolerated treatment well    Behavior During Therapy  [facility] for tasks assessed/performed       Past Medical History:  Diagnosis Date  . Arthritis   . Asthma   . Bipolar 1 disorder ([place])   . Bipolar 1 disorder ([place])   . Breast cancer ([place])   . Depression   . Dysrhythmia    palpitations  . Dysrhythmia    atrial fibrillation  . Fibromyalgia   . Finger fracture 2011  . GERD (gastroesophageal reflux disease)   . Heart murmur   . History of stress test 04/28/2011   showed normal perfusion without scar or ischemia  . Hx of echocardiogram 04/28/2011   showed normal systolic function with mild diastolic dysfunction,she had trace MR but did not have frank mitral valve prolapse demonstrated. She had mild pulmonary hypertension with an estimated RV systolic pressure at 34 mm.  . Hyperlipemia   . Meniere disease   . MVP (mitral valve prolapse)   . Neuromuscular disorder ([place])    fibromyalgia    Past Surgical History:  Procedure Laterality Date  . ANKLE SURGERY     left x4  . BREAST SURGERY  2011   lumpectomy right breast   . COLONOSCOPY WITH PROPOFOL N/A 02/25/2014   Procedure: COLONOSCOPY WITH PROPOFOL;  Surgeon: Garlan Fair, MD;  [facility];  Service: Endoscopy;  Laterality: N/A;  . deviated septum    . ECTOPIC PREGNANCY SURGERY    . ELBOW SURGERY     right elbow  . EYE SURGERY      cataract surgery  . fibroid     2-3 fibroid adenomas removed  . JOINT REPLACEMENT  2011   right knee   . KNEE SURGERY     left knee  . SHOULDER ARTHROSCOPY WITH BICEPSTENOTOMY Right 09/25/2015   Procedure: SHOULDER ARTHROSCOPY WITH BICEPSTENOTOMY;  Surgeon: Ninetta Lights, MD;  Location: [place];  Service: Orthopedics;  Laterality: Right;  . SHOULDER ARTHROSCOPY WITH DISTAL CLAVICLE RESECTION Right 09/25/2015   Procedure: SHOULDER ARTHROSCOPY WITH DISTAL CLAVICLE RESECTION;  Surgeon: Ninetta Lights, MD;  Location: [place];  Service: Orthopedics;  Laterality: Right;  . SHOULDER ARTHROSCOPY WITH SUBACROMIAL DECOMPRESSION Right 09/25/2015   Procedure: RIGHT SHOULDER ARTHROSCOPY DEBRIDEMENT,ACROMIOPLASTY,DISTAL CLAVICAL EXCISION, RELEASE OF BICEPS TENDON;  Surgeon: Ninetta Lights, MD;  Location: [place];  Service: Orthopedics;  Laterality: Right;  . TMJ ARTHROPLASTY    . TONSILLECTOMY    . TOTAL SHOULDER ARTHROPLASTY Left 01/05/2016  . UTERINE FIBROID SURGERY     polups and fibroids removed   . WRIST SURGERY     left    There were no vitals filed for this visit.  Subjective Assessment - 11/09/17 1622    Subjective  I am worn out due to working.  I hurt when i am not here.      Pertinent History  breast cancer, Lt shoulder replacement 12/2015, Rt TKA, hard of hearing    Currently in Pain?  Yes    Pain Score  6     Pain Location  Neck    Pain Orientation  Left;Right    Pain Descriptors / Indicators  Sore;Tightness    Pain Type  Chronic pain    Pain Onset  More than a month ago    Pain Frequency  Constant    Aggravating Factors   turning head, stress    Pain Relieving Factors  pain medication, heat                       [facility] Adult PT Treatment/Exercise - 11/09/17 0001      Neck Exercises: Machines for Strengthening   Nustep  Level 2x 8 minutes      Moist Heat Therapy   Number Minutes Moist Heat  15 Minutes     Moist Heat Location  Cervical      Electrical Stimulation   Electrical Stimulation Location  cervical     Electrical Stimulation Action  IFC    Electrical Stimulation Parameters  15 minutes    Electrical Stimulation Goals  Pain      Manual Therapy   Manual therapy comments  suboccipital release, cranial circles    Soft tissue mobilization  bil cervical paraspinals, suboccipitals, upper traps    Manual Traction  3x 20 sec holds       Trigger Point Dry Needling - 11/09/17 1623    Consent Given?  Yes    Muscles Treated Upper Body  Upper trapezius;Oblique capitus;Suboccipitals muscle group   cervical multifidi   Upper Trapezius Response  Twitch reponse elicited;Palpable increased muscle length    Oblique Capitus Response  Twitch response elicited;Palpable increased muscle length    SubOccipitals Response  Twitch response elicited;Palpable increased muscle length             PT Short Term Goals - 10/18/17 1124      PT SHORT TERM GOAL #1   Title  be independent in initial HEP    Status  Achieved      PT SHORT TERM GOAL #2   Title  report a 25% reduction in cervical pain with ADLs and turning head    Time  4    Period  Weeks    Status  On-going      PT SHORT TERM GOAL #3   Title  demonstrate correct posture and report corrections at home    Status  Achieved      PT SHORT TERM GOAL #4   Title  report a 30% reduction in the frequency and intensity of headaches    Status  Achieved      PT SHORT TERM GOAL #5   Title  demonstrate Lt shoulder IR lacking < or = to 5 inches vs the Rt to improve self-care    Time  4    Period  Weeks    Status  On-going        PT Long Term Goals - 10/27/17 1249      PT LONG TERM GOAL #1   Title  be independent in advanced HEP    Time  6    Period  Weeks    Status  On-going    Target Date  12/08/17      PT LONG TERM  GOAL #2   Title  report a 60% reduction in neck pain with ADLs and turning head    Baseline  50%     Time  6     Period  Weeks    Status  On-going      PT LONG TERM GOAL #3   Title  report a 75% reduction in the frequency and intensity of neck pain and headaches    Baseline  50% reduction    Time  6    Period  Weeks    Status  On-going    Target Date  12/08/17      PT LONG TERM GOAL #4   Title  demonstrate Rt cervical A/ROM rotation to 75 degrees to improve safety with driving    Baseline  80 degrees bil    Status  Achieved      PT LONG TERM GOAL #5   Title  report < or = to 5/10 Lt shoulder pain with reaching and use with ADLs    Baseline  2.5/10    Status  Achieved    Target Date  12/08/17            Plan - 11/09/17 1632    Clinical Impression Statement  Pt with limited treatment recently due to work schedule.  Pt reports 6/10 neck pain that is constant.  Pt with tension and trigger points in bil neck, suboccipitals and bil upper traps and demonstrated improved tissue mobility after dry needling.  Pt has high levels of stress and PT educated regarding need to reduce stress to reduce pain.  Pt will continue to benefit from skilled PT for postural strength, flexibility, manual and modalities for pain.      Rehab Potential  Good    PT Frequency  2x / week    PT Duration  8 weeks    PT Treatment/Interventions  ADLs/Self Care Home Management;Cryotherapy;Electrical Stimulation;Traction;Moist Heat;Therapeutic activities;Therapeutic exercise;Patient/family education;Neuromuscular re-education;Manual techniques;Passive range of motion;Taping;Dry needling    PT Next Visit Plan  1 more dry needling sessions.  Progress HEP, conitnue to NuStep to imrpove UE mobility and overall endurance    PT Home Exercise Plan   Access Code: [email or web address]     Recommended Other Services  cert and recert is signed    Consulted and Agree with Plan of Care  Patient       Patient will benefit from skilled therapeutic intervention in order to improve the following deficits and impairments:  Impaired flexibility, Decreased  activity tolerance, Decreased endurance, Decreased range of motion, Decreased strength, Postural dysfunction, Increased muscle spasms, Improper body mechanics, Pain  Visit Diagnosis: Abnormal posture  Cervicalgia  Cramp and spasm  Muscle weakness (generalized)     Problem List Patient Active Problem List   Diagnosis Date Noted  . DOE (dyspnea on exertion) 11/03/2017  . Prediabetes 10/21/2016  . Headache 10/12/2016  . 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 Chronic pain syndrome 05/27/2016  . Osteoarthritis 05/16/2016  . Seasonal allergies 05/03/2016  . History of palpitations 09/16/2015  . Bipolar 1 disorder ([place]) 09/16/2015  . History of breast cancer 11/16/2012  . Fibromyalgia 11/16/2012  . Cigarette smoker 11/16/2012  . INTERTRIGO 04/13/2010  . Hyperlipidemia 09/02/2006  . Depression 09/02/2006  . Essential hypertension 09/02/2006  . COPD (chronic obstructive pulmonary disease) ([place]) 09/02/2006  . GERD 09/02/2006   Sigurd Sos, PT 11/09/17 5:08 PM  [facility] [address], [place], [place], [address] Phone: [phone]  Fax:  [phone]  Name: Anita Arroyo MRN: [id number] Date of Birth: 1960/03/25

## 2017-11-10 ENCOUNTER — Other Ambulatory Visit: Payer: Self-pay | Admitting: Acute Care

## 2017-11-10 DIAGNOSIS — F17210 Nicotine dependence, cigarettes, uncomplicated: Secondary | ICD-10-CM

## 2017-11-10 DIAGNOSIS — Z122 Encounter for screening for malignant neoplasm of respiratory organs: Secondary | ICD-10-CM

## 2017-11-16 ENCOUNTER — Ambulatory Visit: Payer: Medicare HMO

## 2017-11-16 ENCOUNTER — Ambulatory Visit: Payer: Self-pay

## 2017-11-17 ENCOUNTER — Ambulatory Visit: Payer: Medicare HMO

## 2017-11-18 ENCOUNTER — Ambulatory Visit: Payer: Medicare HMO | Admitting: Physical Therapy

## 2017-11-18 ENCOUNTER — Encounter: Payer: Self-pay | Admitting: Physical Therapy

## 2017-11-18 DIAGNOSIS — R293 Abnormal posture: Secondary | ICD-10-CM | POA: Diagnosis not present

## 2017-11-18 DIAGNOSIS — M542 Cervicalgia: Secondary | ICD-10-CM

## 2017-11-18 DIAGNOSIS — M6281 Muscle weakness (generalized): Secondary | ICD-10-CM

## 2017-11-18 DIAGNOSIS — R252 Cramp and spasm: Secondary | ICD-10-CM

## 2017-11-18 NOTE — Therapy (Signed)
[facility] [address], [place], [place], [address] Phone: [phone]   Fax:  [phone]  Physical Therapy Treatment  Patient Details  Name: Anita Arroyo MRN: [id number] Date of Birth: [birth year] Referring Provider (PT): Edmonia Lynch, MD   Encounter Date: 11/18/2017  PT End of Session - 11/18/17 1143    Visit Number  9    Date for PT Re-Evaluation  12/08/17    Authorization Type  [employer]    PT Start Time  1101    PT Stop Time  1151    PT Time Calculation (min)  50 min    Activity Tolerance  Patient tolerated treatment well       Past Medical History:  Diagnosis Date  . Arthritis   . Asthma   . Bipolar 1 disorder ([place])   . Bipolar 1 disorder ([place])   . Breast cancer ([place])   . Depression   . Dysrhythmia    palpitations  . Dysrhythmia    atrial fibrillation  . Fibromyalgia   . Finger fracture 2011  . GERD (gastroesophageal reflux disease)   . Heart murmur   . History of stress test 04/28/2011   showed normal perfusion without scar or ischemia  . Hx of echocardiogram 04/28/2011   showed normal systolic function with mild diastolic dysfunction,she had trace MR but did not have frank mitral valve prolapse demonstrated. She had mild pulmonary hypertension with an estimated RV systolic pressure at 34 mm.  . Hyperlipemia   . Meniere disease   . MVP (mitral valve prolapse)   . Neuromuscular disorder ([place])    fibromyalgia    Past Surgical History:  Procedure Laterality Date  . ANKLE SURGERY     left x4  . BREAST SURGERY  2011   lumpectomy right breast   . COLONOSCOPY WITH PROPOFOL N/A 02/25/2014   Procedure: COLONOSCOPY WITH PROPOFOL;  Surgeon: Garlan Fair, MD;  Location: [facility];  Service: Endoscopy;  Laterality: N/A;  . deviated septum    . ECTOPIC PREGNANCY SURGERY    . ELBOW SURGERY     right elbow  . EYE SURGERY     cataract surgery  . fibroid     2-3 fibroid adenomas removed  .  JOINT REPLACEMENT  2011   right knee   . KNEE SURGERY     left knee  . SHOULDER ARTHROSCOPY WITH BICEPSTENOTOMY Right 09/25/2015   Procedure: SHOULDER ARTHROSCOPY WITH BICEPSTENOTOMY;  Surgeon: Ninetta Lights, MD;  Location: [place];  Service: Orthopedics;  Laterality: Right;  . SHOULDER ARTHROSCOPY WITH DISTAL CLAVICLE RESECTION Right 09/25/2015   Procedure: SHOULDER ARTHROSCOPY WITH DISTAL CLAVICLE RESECTION;  Surgeon: Ninetta Lights, MD;  Location: [place];  Service: Orthopedics;  Laterality: Right;  . SHOULDER ARTHROSCOPY WITH SUBACROMIAL DECOMPRESSION Right 09/25/2015   Procedure: RIGHT SHOULDER ARTHROSCOPY DEBRIDEMENT,ACROMIOPLASTY,DISTAL CLAVICAL EXCISION, RELEASE OF BICEPS TENDON;  Surgeon: Ninetta Lights, MD;  Location: [place];  Service: Orthopedics;  Laterality: Right;  . TMJ ARTHROPLASTY    . TONSILLECTOMY    . TOTAL SHOULDER ARTHROPLASTY Left 01/05/2016  . UTERINE FIBROID SURGERY     polups and fibroids removed   . WRIST SURGERY     left    There were no vitals filed for this visit.  Subjective Assessment - 11/18/17 1103    Subjective  Had injection in her foot and left shoulder 2 days ago.  HS Reunion is tomorrow night.  Pertinent History  breast cancer, Lt shoulder replacement 12/2015, Rt TKA, hard of hearing    Patient Stated Goals  reduce neck pain, improve Lt shoulder pain    Currently in Pain?  Yes    Pain Score  5     Pain Location  Neck    Pain Orientation  Left;Right    Aggravating Factors   turning head                       [facility] Adult PT Treatment/Exercise - 11/18/17 0001      Neck Exercises: Machines for Strengthening   Nustep  L1 5 min;  L3 3 min;       Moist Heat Therapy   Number Minutes Moist Heat  13 Minutes    Moist Heat Location  Cervical      Electrical Stimulation   Electrical Stimulation Location  cervical     Electrical Stimulation Action  IFC    Electrical  Stimulation Parameters  13 min 9 ma supine on wedge   Electrical Stimulation Goals  Pain      Manual Therapy   Manual therapy comments    Soft tissue mobilization  bil cervical paraspinals, suboccipitals, upper traps    Manual Traction       Trigger Point Dry Needling - 11/18/17 1110    Consent Given?  Yes    Muscles Treated Upper Body  --   bil cervical multifidi   Upper Trapezius Response  Twitch reponse elicited;Palpable increased muscle length    Oblique Capitus Response  Palpable increased muscle length    SubOccipitals Response  Palpable increased muscle length      Performed bilaterally       PT Short Term Goals - 10/18/17 1124      PT SHORT TERM GOAL #1   Title  be independent in initial HEP    Status  Achieved      PT SHORT TERM GOAL #2   Title  report a 25% reduction in cervical pain with ADLs and turning head    Time  4    Period  Weeks    Status  On-going      PT SHORT TERM GOAL #3   Title  demonstrate correct posture and report corrections at home    Status  Achieved      PT SHORT TERM GOAL #4   Title  report a 30% reduction in the frequency and intensity of headaches    Status  Achieved      PT SHORT TERM GOAL #5   Title  demonstrate Lt shoulder IR lacking < or = to 5 inches vs the Rt to improve self-care    Time  4    Period  Weeks    Status  On-going        PT Long Term Goals - 10/27/17 1249      PT LONG TERM GOAL #1   Title  be independent in advanced HEP    Time  6    Period  Weeks    Status  On-going    Target Date  12/08/17      PT LONG TERM GOAL #2   Title  report a 60% reduction in neck pain with ADLs and turning head    Baseline  50%     Time  6    Period  Weeks    Status  On-going      PT LONG TERM GOAL #  3   Title  report a 75% reduction in the frequency and intensity of neck pain and headaches    Baseline  50% reduction    Time  6    Period  Weeks    Status  On-going    Target Date  12/08/17      PT LONG TERM GOAL  #4   Title  demonstrate Rt cervical A/ROM rotation to 75 degrees to improve safety with driving    Baseline  80 degrees bil    Status  Achieved      PT LONG TERM GOAL #5   Title  report < or = to 5/10 Lt shoulder pain with reaching and use with ADLs    Baseline  2.5/10    Status  Achieved    Target Date  12/08/17            Plan - 11/18/17 1144    Clinical Impression Statement  The patient reports DN has been the most helpful intervention in managing her chronic neck pain.  She continues to have multiple tender points in cervical musculature but much improved soft tissue muscle length and decreased tender point size and number following DN and manual therapy.  She requests to increase resistance on the Nu-Step.  Discussed that today was the last time we would DN during this course of treatment.  We discussed that we could offer manual therapy 1-2 visits and she agreed that this would be helpful in helping manage her pain and while continuing increased activity.  Therapist closely monitoring response with all treatment interventions.      Rehab Potential  Good    PT Frequency  2x / week    PT Duration  8 weeks    PT Treatment/Interventions  ADLs/Self Care Home Management;Cryotherapy;Electrical Stimulation;Traction;Moist Heat;Therapeutic activities;Therapeutic exercise;Patient/family education;Neuromuscular re-education;Manual techniques;Passive range of motion;Taping;Dry needling    PT Next Visit Plan  10th visit prog note;  manual therapy;   Progress HEP, conitnue to NuStep to imrpove UE mobility and overall endurance    PT Home Exercise Plan   Access Code: [facility]        Patient will benefit from skilled therapeutic intervention in order to improve the following deficits and impairments:  Impaired flexibility, Decreased activity tolerance, Decreased endurance, Decreased range of motion, Decreased strength, Postural dysfunction, Increased muscle spasms, Improper body mechanics,  Pain  Visit Diagnosis: Abnormal posture  Cervicalgia  Cramp and spasm  Muscle weakness (generalized)     Problem List Patient Active Problem List   Diagnosis Date Noted  . DOE (dyspnea on exertion) 11/03/2017  . Prediabetes 10/21/2016  . Headache 10/12/2016  . 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 Chronic pain syndrome 05/27/2016  . Osteoarthritis 05/16/2016  . Seasonal allergies 05/03/2016  . History of palpitations 09/16/2015  . Bipolar 1 disorder ([place]) 09/16/2015  . History of breast cancer 11/16/2012  . Fibromyalgia 11/16/2012  . Cigarette smoker 11/16/2012  . INTERTRIGO 04/13/2010  . Hyperlipidemia 09/02/2006  . Depression 09/02/2006  . Essential hypertension 09/02/2006  . COPD (chronic obstructive pulmonary disease) ([place]) 09/02/2006  . GERD 09/02/2006   Ruben Im, PT 11/18/17 11:58 AM Phone: [phone] Fax: [phone]  Alvera Singh 11/18/2017, 11:52 AM  [facility] [address] [place], [place], [address] Phone: [phone]   Fax:  [phone]  Name: Anita Arroyo MRN: [id number] Date of Birth: 09-05-1960

## 2017-11-23 ENCOUNTER — Ambulatory Visit: Payer: Medicare HMO

## 2017-11-23 DIAGNOSIS — R252 Cramp and spasm: Secondary | ICD-10-CM

## 2017-11-23 DIAGNOSIS — M6281 Muscle weakness (generalized): Secondary | ICD-10-CM

## 2017-11-23 DIAGNOSIS — M542 Cervicalgia: Secondary | ICD-10-CM

## 2017-11-23 DIAGNOSIS — R293 Abnormal posture: Secondary | ICD-10-CM

## 2017-11-23 NOTE — Therapy (Addendum)
[facility] [address] [place], [place], [address] Phone: [phone]   Fax:  [phone]  Physical Therapy Treatment  Patient Details  Name: Anita Arroyo MRN: [id number] Date of Birth: 1960/07/29 Referring Provider (PT): Edmonia Lynch, MD   Encounter Date: 11/23/2017 Progress Note Reporting Period 09/01/17 to 11/23/17  See note below for Objective Data and Assessment of Progress/Goals.      PT End of Session - 11/23/17 1142    Visit Number  10    Date for PT Re-Evaluation  12/08/17    Authorization Type  [employer]    PT Start Time  1102    PT Stop Time  1156    PT Time Calculation (min)  54 min    Activity Tolerance  Patient tolerated treatment well    Behavior During Therapy  WFL for tasks assessed/performed       Past Medical History:  Diagnosis Date  . Arthritis   . Asthma   . Bipolar 1 disorder ([place])   . Bipolar 1 disorder ([place])   . Breast cancer ([place])   . Depression   . Dysrhythmia    palpitations  . Dysrhythmia    atrial fibrillation  . Fibromyalgia   . Finger fracture 2011  . GERD (gastroesophageal reflux disease)   . Heart murmur   . History of stress test 04/28/2011   showed normal perfusion without scar or ischemia  . Hx of echocardiogram 04/28/2011   showed normal systolic function with mild diastolic dysfunction,she had trace MR but did not have frank mitral valve prolapse demonstrated. She had mild pulmonary hypertension with an estimated RV systolic pressure at 34 mm.  . Hyperlipemia   . Meniere disease   . MVP (mitral valve prolapse)   . Neuromuscular disorder ([place])    fibromyalgia    Past Surgical History:  Procedure Laterality Date  . ANKLE SURGERY     left x4  . BREAST SURGERY  2011   lumpectomy right breast   . COLONOSCOPY WITH PROPOFOL N/A 02/25/2014   Procedure: COLONOSCOPY WITH PROPOFOL;  Surgeon: Garlan Fair, MD;  [facility];  Service: Endoscopy;   Laterality: N/A;  . deviated septum    . ECTOPIC PREGNANCY SURGERY    . ELBOW SURGERY     right elbow  . EYE SURGERY     cataract surgery  . fibroid     2-3 fibroid adenomas removed  . JOINT REPLACEMENT  2011   right knee   . KNEE SURGERY     left knee  . SHOULDER ARTHROSCOPY WITH BICEPSTENOTOMY Right 09/25/2015   Procedure: SHOULDER ARTHROSCOPY WITH BICEPSTENOTOMY;  Surgeon: Ninetta Lights, MD;  Location: [place];  Service: Orthopedics;  Laterality: Right;  . SHOULDER ARTHROSCOPY WITH DISTAL CLAVICLE RESECTION Right 09/25/2015   Procedure: SHOULDER ARTHROSCOPY WITH DISTAL CLAVICLE RESECTION;  Surgeon: Ninetta Lights, MD;  Location: [place];  Service: Orthopedics;  Laterality: Right;  . SHOULDER ARTHROSCOPY WITH SUBACROMIAL DECOMPRESSION Right 09/25/2015   Procedure: RIGHT SHOULDER ARTHROSCOPY DEBRIDEMENT,ACROMIOPLASTY,DISTAL CLAVICAL EXCISION, RELEASE OF BICEPS TENDON;  Surgeon: Ninetta Lights, MD;  Location: [place];  Service: Orthopedics;  Laterality: Right;  . TMJ ARTHROPLASTY    . TONSILLECTOMY    . TOTAL SHOULDER ARTHROPLASTY Left 01/05/2016  . UTERINE FIBROID SURGERY     polups and fibroids removed   . WRIST SURGERY     left    There were no vitals filed  for this visit.  Subjective Assessment - 11/23/17 1103    Subjective  My neck feels about the same.      Currently in Pain?  Yes    Pain Score  4     Pain Location  Neck    Pain Orientation  Left;Right    Pain Descriptors / Indicators  Sore;Tightness    Pain Type  Chronic pain    Pain Onset  More than a month ago    Pain Frequency  Constant    Aggravating Factors   turning head, everything    Pain Relieving Factors  pain medication, heat         [facility] PT Assessment - 11/23/17 0001      Assessment   Medical Diagnosis  Lt shoulder and neck pain    Onset Date/Surgical Date  --   chronic     Observation/Other Assessments   Focus on Therapeutic Outcomes  (FOTO)   41% limitation      AROM   Cervical Flexion  50    Cervical - Right Side Bend  45    Cervical - Left Side Bend  50    Cervical - Right Rotation  80    Cervical - Left Rotation  80                   [facility] Adult PT Treatment/Exercise - 11/23/17 0001      Exercises   Exercises  Shoulder;Neck      Neck Exercises: Machines for Strengthening   Nustep  Level 3x 8 minutes       Neck Exercises: Theraband   Shoulder Extension  10 reps;Red    Rows  20 reps;Red      Shoulder Exercises: Seated   Other Seated Exercises  3 way raises: 2x10 bil      Moist Heat Therapy   Number Minutes Moist Heat  15 Minutes    Moist Heat Location  Cervical      Electrical Stimulation   Electrical Stimulation Location  cervical     Electrical Stimulation Action  IFC    Electrical Stimulation Parameters  15 minutes    Electrical Stimulation Goals  Pain      Manual Therapy   Manual therapy comments  suboccipital release, cranial circles    Soft tissue mobilization  bil cervical paraspinals, suboccipitals, upper traps      Neck Exercises: Stretches   Other Neck Stretches  cervical A/ROM 3 ways x20 seconds               PT Short Term Goals - 10/18/17 1124      PT SHORT TERM GOAL #1   Title  be independent in initial HEP    Status  Achieved      PT SHORT TERM GOAL #2   Title  report a 25% reduction in cervical pain with ADLs and turning head    Time  4    Period  Weeks    Status  On-going      PT SHORT TERM GOAL #3   Title  demonstrate correct posture and report corrections at home    Status  Achieved      PT SHORT TERM GOAL #4   Title  report a 30% reduction in the frequency and intensity of headaches    Status  Achieved      PT SHORT TERM GOAL #5   Title  demonstrate Lt shoulder IR lacking < or = to 5  inches vs the Rt to improve self-care    Time  4    Period  Weeks    Status  On-going        PT Long Term Goals - 11/23/17 1118      PT LONG TERM GOAL  #1   Title  be independent in advanced HEP    Time  6    Period  Weeks    Status  On-going      PT LONG TERM GOAL #2   Title  report a 60% reduction in neck pain with ADLs and turning head    Baseline  50%     Time  6    Period  Weeks    Status  On-going      PT LONG TERM GOAL #3   Title  report a 75% reduction in the frequency and intensity of neck pain and headaches    Baseline  50% reduction    Time  6    Period  Weeks    Status  On-going      PT LONG TERM GOAL #4   Title  demonstrate Rt cervical A/ROM rotation to 75 degrees to improve safety with driving    Baseline  80 degrees bil    Status  Achieved      PT LONG TERM GOAL #5   Title  report < or = to 5/10 Lt shoulder pain with reaching and use with ADLs    Baseline  this varies 2.5-6/10    Time  8    Period  Weeks    Status  On-going            Plan - 11/23/17 1119    Clinical Impression Statement  PT spent portion of session reviewing HEP as pt was not doing the exercises consistently.  Pt thought that her new exercises were to do tissue mobilization and trigger point release with tennis balls and rollers.  Pt was able to perform all HEP without limitation today. Pt reports 50% overall improvement in neck and headache symptoms since the start of care. Pt with tissue tension and trigger points in bil neck and suboccipitals and demonstrated improved tissue mobility after manual therapy today.  Pt with chronic neck and shoulder pain and and will continue to benefit from skilled PT for flexibility, strength and manual therapy to address chronic pain and tension.    Rehab Potential  Good    PT Frequency  2x / week    PT Duration  8 weeks    PT Treatment/Interventions  ADLs/Self Care Home Management;Cryotherapy;Electrical Stimulation;Traction;Moist Heat;Therapeutic activities;Therapeutic exercise;Patient/family education;Neuromuscular re-education;Manual techniques;Passive range of motion;Taping;Dry needling    PT Next  Visit Plan  NuStep, HEP, manual therapy    PT Home Exercise Plan   Access Code: [email or web address]     Consulted and Agree with Plan of Care  Patient       Patient will benefit from skilled therapeutic intervention in order to improve the following deficits and impairments:  Impaired flexibility, Decreased activity tolerance, Decreased endurance, Decreased range of motion, Decreased strength, Postural dysfunction, Increased muscle spasms, Improper body mechanics, Pain  Visit Diagnosis: Cervicalgia  Abnormal posture  Muscle weakness (generalized)  Cramp and spasm     Problem List Patient Active Problem List   Diagnosis Date Noted  . DOE (dyspnea on exertion) 11/03/2017  . Prediabetes 10/21/2016  . Headache 10/12/2016  . 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 Chronic pain syndrome 05/27/2016  . Osteoarthritis  05/16/2016  . Seasonal allergies 05/03/2016  . History of palpitations 09/16/2015  . Bipolar 1 disorder ([place]) 09/16/2015  . History of breast cancer 11/16/2012  . Fibromyalgia 11/16/2012  . Cigarette smoker 11/16/2012  . INTERTRIGO 04/13/2010  . Hyperlipidemia 09/02/2006  . Depression 09/02/2006  . Essential hypertension 09/02/2006  . COPD (chronic obstructive pulmonary disease) ([place]) 09/02/2006  . GERD 09/02/2006    Sigurd Sos, PT 11/23/17 11:44 AM PHYSICAL THERAPY DISCHARGE SUMMARY  Visits from Start of Care: 10  Current functional level related to goals / functional outcomes: Pt didn't return after visit on 11/23/17.  See above for current PT status.     Remaining deficits: See above.  Pt with chronic neck and shoulder pain and has HEP in place to address remaining deficits.   Education / Equipment: HEP, posture/body mechanics Plan: Patient agrees to discharge.  Patient goals were partially met. Patient is being discharged due to not returning since the last visit.  ?????        Sigurd Sos, PT 12/12/17 11:25 AM  [facility] [address] [place], [place], [address] Phone: [phone]   Fax:  [phone]  Name: Anita Arroyo MRN: [id number] Date of Birth: October 09, 1960

## 2017-11-26 LAB — LIPID PANEL
Chol/HDL Ratio: 3.4 ratio (ref 0.0–4.4)
Cholesterol, Total: 235 mg/dL — ABNORMAL HIGH (ref 100–199)
HDL: 70 mg/dL (ref >39)
LDL Calculated: 142 mg/dL — ABNORMAL HIGH (ref 0–99)
Triglycerides: 113 mg/dL (ref 0–149)
VLDL CHOLESTEROL CAL: 23 mg/dL (ref 5–40)

## 2017-11-26 LAB — HEPATIC FUNCTION PANEL
ALT: 21 IU/L (ref 0–32)
AST: 21 IU/L (ref 0–40)
Albumin: 4.7 g/dL (ref 3.5–5.5)
Alkaline Phosphatase: 109 IU/L (ref 39–117)
Bilirubin Total: <0.2 mg/dL (ref 0.0–1.2)
Bilirubin, Direct: 0.08 mg/dL (ref 0.00–0.40)
TOTAL PROTEIN: 6.7 g/dL (ref 6.0–8.5)

## 2017-11-26 LAB — BASIC METABOLIC PANEL
BUN/Creatinine Ratio: 18 (ref 9–23)
BUN: 17 mg/dL (ref 6–24)
CALCIUM: 9.8 mg/dL (ref 8.7–10.2)
CO2: 27 mmol/L (ref 20–29)
Chloride: 98 mmol/L (ref 96–106)
Creatinine, Ser: 0.93 mg/dL (ref 0.57–1.00)
GFR calc non Af Amer: 68 mL/min/{1.73_m2} (ref >59)
GFR, EST AFRICAN AMERICAN: 78 mL/min/{1.73_m2} (ref >59)
Glucose: 84 mg/dL (ref 65–99)
Potassium: 4.3 mmol/L (ref 3.5–5.2)
Sodium: 140 mmol/L (ref 134–144)

## 2017-11-29 ENCOUNTER — Ambulatory Visit (INDEPENDENT_AMBULATORY_CARE_PROVIDER_SITE_OTHER): Payer: Medicare HMO | Admitting: Pharmacist Clinician (PhC)/ Clinical Pharmacy Specialist

## 2017-11-29 DIAGNOSIS — E785 Hyperlipidemia, unspecified: Secondary | ICD-10-CM

## 2017-11-29 MED ORDER — EZETIMIBE 10 MG PO TABS: 10.0000 mg | ORAL_TABLET | Freq: Every day | ORAL | 12 refills | Status: DC

## 2017-11-29 NOTE — Progress Notes (Signed)
12/01/2017 Anita Arroyo 1960/11/20 [id number]   HPI:  Anita Arroyo is a 57 y.o. female patient of Dr Claiborne Billings, who presents today for a lipid clinic evaluation.  In addition to hyperlipidemia, her medical history is significant for hypertension, bipolar disorder, fibromyalgia, COPD, and chronic pain.  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    Patient is somewhat upset today, apparently she has voiced some frustrations with lack of ability to reach the office by phone.  She wishes to speak with the office director.  She is also frustrated with [place], as she was expecting to start "cholesterol injections" today.  She is very upset, as "Dr Claiborne Billings wants me on a statin drug, but I can't take them, so I have to have the shots".   She was previously on ezetimibe, but believes it was discontinued due to cost issues.    Current Medications: none  Cholesterol Goals: LDL < 100   Intolerant/previously tried:  Rosuvastatin caused severe depression along with fatigue and myalgia  lipitor -  Myalgia, fatigue, depression  Family history:  Patient adopted - states some family record has been given in the past and should be "in the computer" for me to see  Diet:  Mostly home cooked , no fried foods; eats meats regularly (excpet fish);likes easy to fix, as often she is bringing her foods into other peoples homes to cook.  Not a lot of vegetables/salads; snacks regularly - chips, cheese/crackers   Exercise:  Walks with her job; Building control surveyor  Labs:  11/25/2017:   TC 235, TG 113, HDL 70, LDL 142  11/10/2016:  TC 250, TG 134, HDL 61, LDL 162   Current Outpatient Medications  Medication Sig Dispense Refill  . albuterol (PROVENTIL HFA;VENTOLIN HFA) 108 (90 Base) MCG/ACT inhaler Inhale 2 puffs every 4 (four) hours as needed into the lungs for wheezing or shortness of breath (cough, shortness of breath or wheezing.). 3 Inhaler 0  . Alcohol Swabs (B-D SINGLE USE SWABS REGULAR) PADS     .  atenolol (TENORMIN) 50 MG tablet Take 50 mg in the morning and 25 mg in the evening 135 tablet 1  . Biotin (BIOTIN 5000) 5 MG CAPS Take 5 mg by mouth daily.    . blood glucose meter kit and supplies KIT Dispense based on patient and insurance preference. Use daily as directed. (FOR ICD-9 250.00, 250.01). 1 each 0  . budesonide-formoterol (SYMBICORT) 80-4.5 MCG/ACT inhaler Inhale 2 puffs into the lungs 2 (two) times daily. 1 Inhaler 11  . cetirizine (ZYRTEC) 10 MG tablet Take 10 mg by mouth daily.     . diclofenac sodium (VOLTAREN) 1 % GEL     . ezetimibe (ZETIA) 10 MG tablet Take 1 tablet (10 mg total) by mouth daily. 30 tablet 12  . fluticasone (FLONASE) 50 MCG/ACT nasal spray USE 1 SPRAY IN EACH NOSTRIL TWICE DAILY 48 g 1  . HYDROcodone-acetaminophen (NORCO/VICODIN) 5-325 MG tablet take 1 TABLET BY MOUTH EVERY 6 HOURS AS NEEDED for moderate pain 120 tablet 0  . L-Methylfolate (DEPLIN PO) Take 1 tablet by mouth daily.    Marland Kitchen lamoTRIgine (LAMICTAL) 150 MG tablet Take 1 tablet (150 mg total) by mouth 2 (two) times daily. (Patient taking differently: Take 200 mg by mouth 2 (two) times daily. ) 60 tablet 0  . Magnesium 250 MG TABS Take 250 mg by mouth daily.    . meclizine (ANTIVERT) 25 MG tablet Take 1 tablet (25  mg total) by mouth as needed for dizziness. 30 tablet 1  . montelukast (SINGULAIR) 10 MG tablet Take 1 tablet (10 mg total) at bedtime by mouth. 90 tablet 3  . pantoprazole (PROTONIX) 40 MG tablet Take 1 tablet (40 mg total) by mouth daily. 90 tablet 1  . traZODone (DESYREL) 100 MG tablet Take 2 tablets (200 mg total) by mouth at bedtime. 60 tablet 0  . triamterene-hydrochlorothiazide (MAXZIDE-25) 37.5-25 MG tablet Take 0.5 tablets daily by mouth. 90 tablet 3  . TRUE METRIX BLOOD GLUCOSE TEST test strip     . TRUEPLUS LANCETS 33G MISC     . venlafaxine XR (EFFEXOR-XR) 150 MG 24 hr capsule Take 1 capsule (150 mg total) by mouth at bedtime. (Patient taking differently: Take 150 mg by mouth  daily with breakfast. ) 30 capsule 0   No current facility-administered medications for this visit.     Allergies  Allergen Reactions  . Contrast Media [Iodinated Diagnostic Agents] Shortness Of Breath  . Iodine Shortness Of Breath  . Adhesive [Tape] Other (See Comments)    Reaction:  Blisters   . Celecoxib     "I climb the walls"  . Statins     Body aches, constipation AND DEPRESSION  . Ceclor [Cefaclor] Rash  . Moxifloxacin Rash  . Sulfonamide Derivatives Nausea And Vomiting    Past Medical History:  Diagnosis Date  . Arthritis   . Asthma   . Bipolar 1 disorder ([place])   . Bipolar 1 disorder ([place])   . Breast cancer ([place])   . Depression   . Dysrhythmia    palpitations  . Dysrhythmia    atrial fibrillation  . Fibromyalgia   . Finger fracture 2011  . GERD (gastroesophageal reflux disease)   . Heart murmur   . History of stress test 04/28/2011   showed normal perfusion without scar or ischemia  . Hx of echocardiogram 04/28/2011   showed normal systolic function with mild diastolic dysfunction,she had trace MR but did not have frank mitral valve prolapse demonstrated. She had mild pulmonary hypertension with an estimated RV systolic pressure at 34 mm.  . Hyperlipemia   . Meniere disease   . MVP (mitral valve prolapse)   . Neuromuscular disorder ([place])    fibromyalgia    Last menstrual period 06/25/2011.   Hyperlipidemia Patient with hyperlipidemia, but no current ASCVD.  She will not qualify to get PCSK-9 inhibitor based on the current insurance requirements.  We discussed options of re-starting ezetimibe and I gave her the patient assistance form from [employer].  She can determine her copay and if it is cost-prohibitive she will fill out her portion of the request and return it to the office.  We can also consider using bempedoic acid once approved in early 2020.  Patient understands the situation and has calmed from her upset/frustrated feelings early in the appointment.      Tommy Medal PharmD CPP [facility]

## 2017-11-29 NOTE — Patient Instructions (Signed)
I will talk to Dr. Claiborne Billings about your history and see if we can get you qualified for the cholesterol injections ([place]).    We will also see if we can get ezetimibe free from the manufacturer.

## 2017-12-01 ENCOUNTER — Encounter

## 2017-12-01 ENCOUNTER — Encounter: Payer: Self-pay | Admitting: Pharmacist Clinician (PhC)/ Clinical Pharmacy Specialist

## 2017-12-01 NOTE — Assessment & Plan Note (Signed)
Patient with hyperlipidemia, but no current ASCVD.  She will not qualify to get PCSK-9 inhibitor based on the current insurance requirements.  We discussed options of re-starting ezetimibe and I gave her the patient assistance form from [employer].  She can determine her copay and if it is cost-prohibitive she will fill out her portion of the request and return it to the office.  We can also consider using bempedoic acid once approved in early 2020.  Patient understands the situation and has calmed from her upset/frustrated feelings early in the appointment.

## 2017-12-02 ENCOUNTER — Telehealth: Payer: Self-pay | Admitting: Psychiatry

## 2017-12-02 ENCOUNTER — Ambulatory Visit: Payer: Medicare HMO | Admitting: Physical Therapy

## 2017-12-02 ENCOUNTER — Ambulatory Visit: Payer: Self-pay

## 2017-12-02 MED ORDER — LAMOTRIGINE 200 MG PO TABS: ORAL_TABLET | ORAL | 0 refills | Status: DC

## 2017-12-02 NOTE — Telephone Encounter (Signed)
Patient was called with results of lamotrigine level and informed that level was with in therapeutic range but was on lower end of range, and that lamotrigine could be increased if depression was not fully controlled.  Patient reported to nurse that she would like for her lamotrigine to be increased. Will increase Lamictal to 200 mg 1/2 tab po q am and 2 tabs po QHS. Script sent to [facility].

## 2017-12-02 NOTE — Telephone Encounter (Signed)
Pt notified and will do increase.

## 2018-01-05 ENCOUNTER — Encounter: Payer: Self-pay | Admitting: Emergency Medicine

## 2018-01-08 ENCOUNTER — Ambulatory Visit (HOSPITAL_COMMUNITY)
Admission: EM | Admit: 2018-01-08 | Discharge: 2018-01-08 | Disposition: A | Discharge status: Home / Self Care | Payer: Medicare HMO | Attending: Family Medicine | Admitting: Family Medicine

## 2018-01-08 ENCOUNTER — Encounter (HOSPITAL_COMMUNITY): Payer: Self-pay | Admitting: Emergency Medicine

## 2018-01-08 ENCOUNTER — Ambulatory Visit (INDEPENDENT_AMBULATORY_CARE_PROVIDER_SITE_OTHER): Payer: Medicare HMO

## 2018-01-08 DIAGNOSIS — S8001XA Contusion of right knee, initial encounter: Secondary | ICD-10-CM

## 2018-01-08 DIAGNOSIS — M25561 Pain in right knee: Secondary | ICD-10-CM | POA: Diagnosis not present

## 2018-01-08 DIAGNOSIS — W19XXXA Unspecified fall, initial encounter: Secondary | ICD-10-CM | POA: Diagnosis not present

## 2018-01-08 DIAGNOSIS — S8011XA Contusion of right lower leg, initial encounter: Secondary | ICD-10-CM

## 2018-01-08 NOTE — ED Triage Notes (Signed)
Pt sts right knee pain after fall last week; pt with large amount of bruising to leg; pt sts warmth and redness

## 2018-01-08 NOTE — Discharge Instructions (Addendum)
Your x ray did show a Questionable nondisplaced fracture versus postsurgical change at the medial margin of the RIGHT medial tibial plateau. We will put you in a knee immobilizer and have you use some crutches to stay off the knee.  Rest, ice elevate.  Continue the hydrocodone for pain.

## 2018-01-09 NOTE — ED Provider Notes (Signed)
[facility]    CSN: [id number] Arrival date & time: 01/08/18  1751     History   Chief Complaint Chief Complaint  Patient presents with  . Knee Pain    HPI Anita Arroyo is a 57 y.o. female.   Pt is a 57 year old female that presents with right knee pain and RLE pain with swelling and bruising. This has been present for approximately 1 week. This started after a fall that occurred last week where she tripped over her cat. She has significant bruising to the RLE that appears to be in the healing stages.  The knee pain has worsened. She is having pain with flexion and extension of the knee. She had previous knee replacement to the knee. She takes hydrocodone 4 x a day for chronic pain. This has been somewhat helping. She is having mild posterior knee pain, without erythema or swelling. No calf pain, tenderness, chest pain or SOB. She has been walking on the right leg. She has not used any ice.   ROS per HPI      Past Medical History:  Diagnosis Date  . Arthritis   . Asthma   . Bipolar 1 disorder ([place])   . Bipolar 1 disorder ([place])   . Breast cancer ([place])   . Depression   . Dysrhythmia    palpitations  . Dysrhythmia    atrial fibrillation  . Fibromyalgia   . Finger fracture 2011  . GERD (gastroesophageal reflux disease)   . Heart murmur   . History of stress test 04/28/2011   showed normal perfusion without scar or ischemia  . Hx of echocardiogram 04/28/2011   showed normal systolic function with mild diastolic dysfunction,she had trace MR but did not have frank mitral valve prolapse demonstrated. She had mild pulmonary hypertension with an estimated RV systolic pressure at 34 mm.  . Hyperlipemia   . Meniere disease   . MVP (mitral valve prolapse)   . Neuromuscular disorder [facility])    fibromyalgia    Patient Active Problem List   Diagnosis Date Noted  . DOE (dyspnea on exertion) 11/03/2017  . Prediabetes 10/21/2016  . Headache 10/12/2016  .  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 Chronic pain syndrome 05/27/2016  . Osteoarthritis 05/16/2016  . Seasonal allergies 05/03/2016  . History of palpitations 09/16/2015  . Bipolar 1 disorder ([place]) 09/16/2015  . History of breast cancer 11/16/2012  . Fibromyalgia 11/16/2012  . Cigarette smoker 11/16/2012  . INTERTRIGO 04/13/2010  . Hyperlipidemia 09/02/2006  . Depression 09/02/2006  . Essential hypertension 09/02/2006  . COPD (chronic obstructive pulmonary disease) ([place]) 09/02/2006  . GERD 09/02/2006    Past Surgical History:  Procedure Laterality Date  . ANKLE SURGERY     left x4  . BREAST SURGERY  2011   lumpectomy right breast   . COLONOSCOPY WITH PROPOFOL N/A 02/25/2014   Procedure: COLONOSCOPY WITH PROPOFOL;  Surgeon: Garlan Fair, MD;  Location: [facility];  Service: Endoscopy;  Laterality: N/A;  . deviated septum    . ECTOPIC PREGNANCY SURGERY    . ELBOW SURGERY     right elbow  . EYE SURGERY     cataract surgery  . fibroid     2-3 fibroid adenomas removed  . JOINT REPLACEMENT  2011   right knee   . KNEE SURGERY     left knee  . SHOULDER ARTHROSCOPY WITH BICEPSTENOTOMY Right 09/25/2015   Procedure: SHOULDER ARTHROSCOPY WITH BICEPSTENOTOMY;  Surgeon: Nestor Ramp  Percell Miller, MD;  Location: [place];  Service: Orthopedics;  Laterality: Right;  . SHOULDER ARTHROSCOPY WITH DISTAL CLAVICLE RESECTION Right 09/25/2015   Procedure: SHOULDER ARTHROSCOPY WITH DISTAL CLAVICLE RESECTION;  Surgeon: Ninetta Lights, MD;  Location: [place];  Service: Orthopedics;  Laterality: Right;  . SHOULDER ARTHROSCOPY WITH SUBACROMIAL DECOMPRESSION Right 09/25/2015   Procedure: RIGHT SHOULDER ARTHROSCOPY DEBRIDEMENT,ACROMIOPLASTY,DISTAL CLAVICAL EXCISION, RELEASE OF BICEPS TENDON;  Surgeon: Ninetta Lights, MD;  Location: [place];  Service: Orthopedics;  Laterality: Right;  . TMJ ARTHROPLASTY    . TONSILLECTOMY    . TOTAL SHOULDER  ARTHROPLASTY Left 01/05/2016  . UTERINE FIBROID SURGERY     polups and fibroids removed   . WRIST SURGERY     left    OB History   No obstetric history on file.      Home Medications    Prior to Admission medications   Medication Sig Start Date End Date Taking? Authorizing Provider  albuterol (PROVENTIL HFA;VENTOLIN HFA) 108 (90 Base) MCG/ACT inhaler Inhale 2 puffs every 4 (four) hours as needed into the lungs for wheezing or shortness of breath (cough, shortness of breath or wheezing.). 11/30/16   Harrison Mons, PA  Alcohol Swabs (B-D SINGLE USE SWABS REGULAR) PADS  12/31/16   [provider]  atenolol (TENORMIN) 50 MG tablet Take 50 mg in the morning and 25 mg in the evening 04/27/17   Troy Sine, MD  Biotin (BIOTIN 5000) 5 MG CAPS Take 5 mg by mouth daily.    [provider]  blood glucose meter kit and supplies KIT Dispense based on patient and insurance preference. Use daily as directed. (FOR ICD-9 250.00, 250.01). 11/30/16   Harrison Mons, PA  budesonide-formoterol (SYMBICORT) 80-4.5 MCG/ACT inhaler Inhale 2 puffs into the lungs 2 (two) times daily. 11/03/17   Tanda Rockers, MD  cetirizine (ZYRTEC) 10 MG tablet Take 10 mg by mouth daily.     [provider]  diclofenac sodium (VOLTAREN) 1 % GEL  01/26/17   [provider]  ezetimibe (ZETIA) 10 MG tablet Take 1 tablet (10 mg total) by mouth daily. 11/29/17 02/27/18  Troy Sine, MD  fluticasone (FLONASE) 50 MCG/ACT nasal spray USE 1 SPRAY IN EACH NOSTRIL TWICE DAILY 11/16/16   Harrison Mons, PA  HYDROcodone-acetaminophen (NORCO/VICODIN) 5-325 MG tablet take 1 TABLET BY MOUTH EVERY 6 HOURS AS NEEDED for moderate pain 07/27/17   Harrison Mons, PA  L-Methylfolate (DEPLIN PO) Take 1 tablet by mouth daily.    [provider]  L-Methylfolate-Algae (DEPLIN 15 PO) Take 15 mg by mouth daily. 10/24/17   [provider]  lamoTRIgine (LAMICTAL) 200 MG tablet Take 1/2 tab po q am and 2  tabs po QHS 12/02/17   Thayer Headings, PMHNP  Magnesium 250 MG TABS Take 250 mg by mouth daily.    [provider]  meclizine (ANTIVERT) 25 MG tablet Take 1 tablet (25 mg total) by mouth as needed for dizziness. 11/10/16   Troy Sine, MD  montelukast (SINGULAIR) 10 MG tablet Take 1 tablet (10 mg total) at bedtime by mouth. 11/30/16   Harrison Mons, PA  pantoprazole (PROTONIX) 40 MG tablet Take 1 tablet (40 mg total) by mouth daily. 04/26/17   Harrison Mons, PA  traZODone (DESYREL) 100 MG tablet Take 2 tablets (200 mg total) by mouth at bedtime. 08/01/17   Arfeen, Arlyce Harman, MD  triamterene-hydrochlorothiazide (MAXZIDE-25) 37.5-25 MG tablet Take 0.5 tablets daily by mouth.  11/30/16   Harrison Mons, PA  TRUE METRIX BLOOD GLUCOSE TEST test strip  12/31/16   [provider]  TRUEPLUS LANCETS 33G MISC  12/31/16   [provider]  venlafaxine XR (EFFEXOR-XR) 150 MG 24 hr capsule Take 1 capsule (150 mg total) by mouth at bedtime. Patient taking differently: Take 150 mg by mouth daily with breakfast.  08/01/17   Arfeen, Arlyce Harman, MD    Family History Family History  Problem Relation Age of Onset  . Mitral valve prolapse Mother   . Heart Problems Mother   . Cancer Mother     Social History Social History   Tobacco Use  . Smoking status: Current Every Day Smoker    Packs/day: 1.50    Years: 40.00    Pack years: 60.00    Types: Cigarettes  . Smokeless tobacco: Never Used  . Tobacco comment: Cannot afford chantix. Working on getting discount from drug compny  Substance Use Topics  . Alcohol use: Yes    Alcohol/week: 0.0 standard drinks    Comment: occ  . Drug use: No     Allergies   Contrast media [iodinated diagnostic agents]; Iodine; Adhesive [tape]; Celecoxib; Statins; Ceclor [cefaclor]; Moxifloxacin; and Sulfonamide derivatives   Review of Systems Review of Systems   Physical Exam Triage Vital Signs ED Triage Vitals [01/08/18 1811]  Enc Vitals Group       BP (!) 134/99     Pulse Rate 80     Resp 18     Temp 98.6 F (37 C)     Temp Source Oral     SpO2 100 %     Weight      Height      Head Circumference      Peak Flow      Pain Score 8     Pain Loc      Pain Edu?      Excl. in [place]?    No data found.  Updated Vital Signs BP (!) 134/99 (BP Location: Left Arm)   Pulse 80   Temp 98.6 F (37 C) (Oral)   Resp 18   LMP 06/25/2011   SpO2 100%   Visual Acuity Right Eye Distance:   Left Eye Distance:   Bilateral Distance:    Right Eye Near:   Left Eye Near:    Bilateral Near:     Physical Exam Vitals signs and nursing note reviewed.  Constitutional:      Appearance: Normal appearance.  HENT:     Head: Normocephalic and atraumatic.     Nose: Nose normal.  Eyes:     Conjunctiva/sclera: Conjunctivae normal.  Neck:     Musculoskeletal: Normal range of motion.  Pulmonary:     Effort: Pulmonary effort is normal.  Musculoskeletal:        General: Swelling, tenderness and signs of injury present.     Right lower leg: Edema present.     Comments: Swelling to the right patella that extends into the RLE. Scar from knee replacement. Mostly tender to the medial aspect of the patella.  No erythema to patella.  Bruising to the RLE that appears to be healing. Non tender to the calf. No erythema to the calf or posterior knee. Mildly tender to the posterior calf more medially.   Skin:    General: Skin is warm.     Findings: Bruising present.  Neurological:     Mental Status: She is alert.  Psychiatric:  Mood and Affect: Mood normal.      UC Treatments / Results  Labs (all labs ordered are listed, but only abnormal results are displayed) Labs Reviewed - No data to display  EKG None  Radiology Dg Knee Complete 4 Views Right  Result Date: 01/08/2018 CLINICAL DATA:  [place] 1 week ago injuring LEFT leg, pain and bruising from LEFT needle LEFT ankle,, not along lateral aspect of the at the level of the patella prior  knee replacement EXAM: RIGHT KNEE - COMPLETE 4+ VIEW COMPARISON:  02/18/2010 FINDINGS: Osseous demineralization. Components of a RIGHT knee prosthesis are identified. Questionable nondisplaced fracture at the medial margin of the medial tibial plateau. Significant anterior and LEFT lateral soft tissue swelling. No additional fracture, dislocation, or bone destruction. No knee joint effusion. IMPRESSION: Questionable nondisplaced fracture versus postsurgical change at the medial margin of the RIGHT medial tibial plateau, recommend correlation for pain/tenderness at this site. Anterolateral soft tissue swelling without additional focal bony abnormalities. Electronically Signed   By: Lavonia Dana M.D.   On: 01/08/2018 19:03    Procedures Procedures (including critical care time)  Medications Ordered in UC Medications - No data to display  Initial Impression / Assessment and Plan / UC Course  I have reviewed the triage vital signs and the nursing notes.  Pertinent labs & imaging results that were available during my care of the patient were reviewed by me and considered in my medical decision making (see chart for details).    X ray revealed  Questionable nondisplaced fracture versus postsurgical change at the medial margin of the RIGHT medial tibial plateau, recommend correlation for pain/tenderness at this site. Anterolateral soft tissue swelling without additional focal bony abnormalities.   No concern for DVT  Will place pt in a knee immobilizer and have her rest, ice and elevate. She has crutches at home she can use.  She is going to call her orthopedic and follow up next week.   She can continue her Vicodin for pain. Ibuprofen as needed.    Final Clinical Impressions(s) / UC Diagnoses   Final diagnoses:  Acute pain of right knee  Fall, initial encounter     Discharge Instructions     Your x ray did show a Questionable nondisplaced fracture versus postsurgical change at  the medial margin of the RIGHT medial tibial plateau. We will put you in a knee immobilizer and have you use some crutches to stay off the knee.  Rest, ice elevate.  Continue the hydrocodone for pain.     ED Prescriptions    None     Controlled Substance Prescriptions [place] Controlled Substance Registry consulted? Not Applicable   Orvan July, NP 01/09/18 1805

## 2018-01-12 ENCOUNTER — Ambulatory Visit: Payer: Self-pay

## 2018-01-16 ENCOUNTER — Ambulatory Visit: Payer: Medicare HMO | Admitting: Psychiatry

## 2018-01-25 HISTORY — PX: HAND SURGERY: SHX662

## 2018-02-03 ENCOUNTER — Other Ambulatory Visit: Payer: Self-pay | Admitting: Internal Medicine

## 2018-02-03 DIAGNOSIS — R0609 Other forms of dyspnea: Secondary | ICD-10-CM

## 2018-02-06 ENCOUNTER — Ambulatory Visit: Payer: Self-pay | Admitting: Internal Medicine

## 2018-02-07 ENCOUNTER — Ambulatory Visit: Payer: Medicare HMO | Admitting: Psychiatry

## 2018-02-07 ENCOUNTER — Encounter: Payer: Self-pay | Admitting: Psychiatry

## 2018-02-07 VITALS — BP 98/59 | HR 68

## 2018-02-07 DIAGNOSIS — F3162 Bipolar disorder, current episode mixed, moderate: Secondary | ICD-10-CM

## 2018-02-07 DIAGNOSIS — F5101 Primary insomnia: Secondary | ICD-10-CM | POA: Diagnosis not present

## 2018-02-07 MED ORDER — L-METHYLFOLATE 15 MG PO TABS: 15.0000 mg | ORAL_TABLET | Freq: Every day | ORAL | 5 refills | Status: DC

## 2018-02-07 MED ORDER — VENLAFAXINE HCL ER 37.5 MG PO CP24: ORAL_CAPSULE | ORAL | 1 refills | Status: DC

## 2018-02-07 NOTE — Progress Notes (Signed)
COSTELLA SCHWARZ [id number] 12-31-1960 58 y.o.  Subjective:   Patient ID:  Anita Arroyo is a 58 y.o. (DOB 01-Sep-1960) female.  Chief Complaint:  Chief Complaint  Patient presents with  . Depression    HPI Anita Arroyo presents to the office today for follow-up of mood and anxiety. She reports "I stay out of sorts. Don't feel like talking to anybody." Reports that she has not talked with her mother in a few weeks. She reports that she has been having persistent depression. Reports that samples of [place] seemed to help with her mood. She reports that depression started in December. She reports irritability and notices that she is irritated by things that normally would bother her. Denies anxiety. Reports that energy and motivation have been very low. Reports that she has fallen behind on chores and dishes. She reports that her sleep has been adequate. Reports that she awakens between 7-8 am daily. Estimates sleeping 7-9 hours a night. Reports that she is frequently feeling that she needs to nap.  Appetite has been increased and typically tends to eat more when more depressed. She reports impaired concentration and focus. Reports that she is enjoying a jigsaw puzzle. Denies SI. Denies any impulsive or risky behaviors.   Denies any changes in mood with change in dose of Lamictal.    Review of Systems:  Review of Systems  Musculoskeletal: Positive for gait problem.       Tripped in December and fell on her knee. Reports 2-3 weeks later she went to urgent care and learned she has a fracture on her tibia.   Neurological: Positive for tremors.       Reports chronic mild tremor.   Psychiatric/Behavioral:       Please refer to HPI    Medications: I have reviewed the patient's current medications.  Current Outpatient Medications  Medication Sig Dispense Refill  . albuterol (PROVENTIL HFA;VENTOLIN HFA) 108 (90 Base) MCG/ACT inhaler Inhale 2 puffs every 4 (four) hours as needed into the lungs for  wheezing or shortness of breath (cough, shortness of breath or wheezing.). 3 Inhaler 0  . atenolol (TENORMIN) 50 MG tablet Take 50 mg in the morning and 25 mg in the evening 135 tablet 1  . Biotin (BIOTIN 5000) 5 MG CAPS Take 5 mg by mouth daily.    . budesonide-formoterol (SYMBICORT) 80-4.5 MCG/ACT inhaler Inhale 2 puffs into the lungs 2 (two) times daily. 1 Inhaler 11  . cetirizine (ZYRTEC) 10 MG tablet Take 10 mg by mouth daily.     . diclofenac sodium (VOLTAREN) 1 % GEL     . fluticasone (FLONASE) 50 MCG/ACT nasal spray USE 1 SPRAY IN EACH NOSTRIL TWICE DAILY 48 g 1  . HYDROcodone-acetaminophen (NORCO/VICODIN) 5-325 MG tablet take 1 TABLET BY MOUTH EVERY 6 HOURS AS NEEDED for moderate pain 120 tablet 0  . lamoTRIgine (LAMICTAL) 200 MG tablet Take 1/2 tab po q am and 2 tabs po QHS 315 tablet 0  . Magnesium 250 MG TABS Take 250 mg by mouth daily.    . meclizine (ANTIVERT) 25 MG tablet Take 1 tablet (25 mg total) by mouth as needed for dizziness. 30 tablet 1  . montelukast (SINGULAIR) 10 MG tablet Take 1 tablet (10 mg total) at bedtime by mouth. 90 tablet 3  . pantoprazole (PROTONIX) 40 MG tablet Take 1 tablet (40 mg total) by mouth daily. 90 tablet 1  . traZODone (DESYREL) 100 MG tablet Take 2 tablets (200 mg total) by  mouth at bedtime. 60 tablet 0  . triamterene-hydrochlorothiazide (MAXZIDE-25) 37.5-25 MG tablet Take 0.5 tablets daily by mouth. 90 tablet 3  . venlafaxine XR (EFFEXOR-XR) 150 MG 24 hr capsule Take 1 capsule (150 mg total) by mouth at bedtime. (Patient taking differently: Take 150 mg by mouth daily with breakfast. ) 30 capsule 0  . Alcohol Swabs (B-D SINGLE USE SWABS REGULAR) PADS     . blood glucose meter kit and supplies KIT Dispense based on patient and insurance preference. Use daily as directed. (FOR ICD-9 250.00, 250.01). 1 each 0  . ezetimibe (ZETIA) 10 MG tablet Take 1 tablet (10 mg total) by mouth daily. 30 tablet 5  . L-Methylfolate 15 MG TABS Take 1 tablet (15 mg total)  by mouth daily for 30 days. 30 tablet 5  . L-Methylfolate-Algae (DEPLIN 15 PO) Take 15 mg by mouth daily.    . TRUE METRIX BLOOD GLUCOSE TEST test strip     . TRUEPLUS LANCETS 33G MISC     . venlafaxine XR (EFFEXOR-XR) 37.5 MG 24 hr capsule Take 1 capsule with a 150 mg capsule to equal total daily dose of 187.5 mg po qd. 30 capsule 1   No current facility-administered medications for this visit.     Medication Side Effects: None  Allergies:  Allergies  Allergen Reactions  . Contrast Media [Iodinated Diagnostic Agents] Anaphylaxis    Anaphylactic shock.  . Iodine Shortness Of Breath  . Adhesive [Tape] Other (See Comments)    Reaction:  Blisters   . Celecoxib     "I climb the walls"  . Statins     Body aches, constipation AND DEPRESSION  . Ceclor [Cefaclor] Rash  . Moxifloxacin Rash  . Sulfonamide Derivatives Nausea And Vomiting    Past Medical History:  Diagnosis Date  . Arthritis   . Asthma   . Bipolar 1 disorder ([place])   . Bipolar 1 disorder ([place])   . Breast cancer ([place])   . Depression   . Dysrhythmia    palpitations  . Dysrhythmia    atrial fibrillation  . Fibromyalgia   . Finger fracture 2011  . GERD (gastroesophageal reflux disease)   . Heart murmur   . History of stress test 04/28/2011   showed normal perfusion without scar or ischemia  . Hx of echocardiogram 04/28/2011   showed normal systolic function with mild diastolic dysfunction,she had trace MR but did not have frank mitral valve prolapse demonstrated. She had mild pulmonary hypertension with an estimated RV systolic pressure at 34 mm.  . Hyperlipemia   . Meniere disease   . MVP (mitral valve prolapse)   . Neuromuscular disorder ([place])    fibromyalgia    Family History  Problem Relation Age of Onset  . Mitral valve prolapse Mother   . Heart Problems Mother   . Cancer Mother     Social History   Socioeconomic History  . Marital status: Single    Spouse name: Not on file  . Number of  children: Not on file  . Years of education: Not on file  . Highest education level: Not on file  Occupational History  . Not on file  Social Needs  . Financial resource strain: Not on file  . Food insecurity:    Worry: Not on file    Inability: Not on file  . Transportation needs:    Medical: Not on file    Non-medical: Not on file  Tobacco Use  . Smoking status: Current Every  Day Smoker    Packs/day: 1.50    Years: 40.00    Pack years: 60.00    Types: Cigarettes  . Smokeless tobacco: Never Used  . Tobacco comment: Cannot afford chantix. Working on getting discount from drug compny  Substance and Sexual Activity  . Alcohol use: Yes    Alcohol/week: 0.0 standard drinks    Comment: occ  . Drug use: No  . Sexual activity: Not on file  Lifestyle  . Physical activity:    Days per week: Not on file    Minutes per session: Not on file  . Stress: Not on file  Relationships  . Social connections:    Talks on phone: Not on file    Gets together: Not on file    Attends religious service: Not on file    Active member of club or organization: Not on file    Attends meetings of clubs or organizations: Not on file    Relationship status: Not on file  . Intimate partner violence:    Fear of current or ex partner: Not on file    Emotionally abused: Not on file    Physically abused: Not on file    Forced sexual activity: Not on file  Other Topics Concern  . Not on file  Social History Narrative  . Not on file    Past Medical History, Surgical history, Social history, and Family history were reviewed and updated as appropriate.   Please see review of systems for further details on the patient's review from today.   Objective:   Physical Exam:  BP (!) 98/59   Pulse 68   LMP 06/25/2011   Physical Exam Constitutional:      General: She is not in acute distress.    Appearance: She is well-developed.  Musculoskeletal:        General: No deformity.  Neurological:      Mental Status: She is alert and oriented to person, place, and time.     Coordination: Coordination normal.  Psychiatric:        Mood and Affect: Mood is depressed. Mood is not anxious. Affect is not labile, blunt, angry or inappropriate.        Speech: Speech normal.        Behavior: Behavior normal.        Thought Content: Thought content normal. Thought content does not include homicidal or suicidal ideation. Thought content does not include homicidal or suicidal plan.        Judgment: Judgment normal.     Comments: Insight intact. No auditory or visual hallucinations. No delusions.      Lab Review:     Component Value Date/Time   NA 140 11/25/2017 1155   NA 138 08/20/2014 1117   K 4.3 11/25/2017 1155   K 4.7 08/20/2014 1117   CL 98 11/25/2017 1155   CL 104 06/29/2012 1221   CO2 27 11/25/2017 1155   CO2 26 08/20/2014 1117   GLUCOSE 84 11/25/2017 1155   GLUCOSE 82 03/13/2016 1720   GLUCOSE 92 08/20/2014 1117   GLUCOSE 107 (H) 06/29/2012 1221   BUN 17 11/25/2017 1155   BUN 16.0 08/20/2014 1117   CREATININE 0.93 11/25/2017 1155   CREATININE 0.9 08/20/2014 1117   CALCIUM 9.8 11/25/2017 1155   CALCIUM 9.4 08/20/2014 1117   PROT 6.7 11/25/2017 1155   PROT 6.6 08/20/2014 1117   ALBUMIN 4.7 11/25/2017 1155   ALBUMIN 3.9 08/20/2014 1117   AST 21 11/25/2017  1155   AST 22 08/20/2014 1117   ALT 21 11/25/2017 1155   ALT 18 08/20/2014 1117   ALKPHOS 109 11/25/2017 1155   ALKPHOS 54 08/20/2014 1117   BILITOT <0.2 11/25/2017 1155   BILITOT 0.47 08/20/2014 1117   GFRNONAA 68 11/25/2017 1155   GFRAA 78 11/25/2017 1155       Component Value Date/Time   WBC 6.4 08/12/2016 1659   WBC 10.6 (H) 03/13/2016 1720   RBC 4.53 08/12/2016 1659   RBC 4.87 03/13/2016 1720   HGB 14.2 08/12/2016 1659   HGB 13.7 08/20/2014 1117   HCT 41.9 08/12/2016 1659   HCT 40.3 08/20/2014 1117   PLT 230 08/12/2016 1659   MCV 93 08/12/2016 1659   MCV 94.1 08/20/2014 1117   MCH 31.3 08/12/2016 1659    MCH 30.6 03/13/2016 1720   MCHC 33.9 08/12/2016 1659   MCHC 34.3 03/13/2016 1720   RDW 14.1 08/12/2016 1659   RDW 13.2 08/20/2014 1117   LYMPHSABS 2.4 03/13/2016 1720   LYMPHSABS 2.0 08/20/2014 1117   MONOABS 0.9 03/13/2016 1720   MONOABS 0.5 08/20/2014 1117   EOSABS 0.0 03/13/2016 1720   EOSABS 0.1 08/20/2014 1117   BASOSABS 0.0 03/13/2016 1720   BASOSABS 0.1 08/20/2014 1117    No results found for: POCLITH, LITHIUM   No results found for: PHENYTOIN, PHENOBARB, VALPROATE, CBMZ   .res Assessment: Plan:   Discussed possible tx options to include potential benefits, risks, and side effects of increasing Effexor XR by 37.5 mg po qd. Pt agree to increasing dose of Effexor XR to 187.5 mg po qd to improve depressive s/s.   Continue Trazodone for insomnia.   Bipolar 1 disorder, mixed, moderate (HCC) - Plan: venlafaxine XR (EFFEXOR-XR) 37.5 MG 24 hr capsule  Please see After Visit Summary for patient specific instructions.  Future Appointments  Date Time Provider [place]  02/13/2018  3:00 PM [facility] PFT ROOM [facility] None  02/13/2018  4:15 PM Tanda Rockers, MD [facility] None  03/07/2018  2:00 PM Thayer Headings, PMHNP [facility] None    No orders of the defined types were placed in this encounter.     -------------------------------

## 2018-02-09 ENCOUNTER — Encounter: Payer: Self-pay | Admitting: Cardiovascular Disease

## 2018-02-09 ENCOUNTER — Ambulatory Visit: Payer: Medicare HMO | Admitting: Cardiovascular Disease

## 2018-02-09 DIAGNOSIS — I2510 Atherosclerotic heart disease of native coronary artery without angina pectoris: Secondary | ICD-10-CM | POA: Diagnosis not present

## 2018-02-09 DIAGNOSIS — R002 Palpitations: Secondary | ICD-10-CM

## 2018-02-09 DIAGNOSIS — I5189 Other ill-defined heart diseases: Secondary | ICD-10-CM

## 2018-02-09 DIAGNOSIS — E785 Hyperlipidemia, unspecified: Secondary | ICD-10-CM

## 2018-02-09 DIAGNOSIS — Z789 Other specified health status: Secondary | ICD-10-CM

## 2018-02-09 DIAGNOSIS — I700 Atherosclerosis of aorta: Secondary | ICD-10-CM

## 2018-02-09 DIAGNOSIS — I519 Heart disease, unspecified: Secondary | ICD-10-CM

## 2018-02-09 DIAGNOSIS — I10 Essential (primary) hypertension: Secondary | ICD-10-CM | POA: Diagnosis not present

## 2018-02-09 DIAGNOSIS — R0602 Shortness of breath: Secondary | ICD-10-CM

## 2018-02-09 DIAGNOSIS — Z720 Tobacco use: Secondary | ICD-10-CM

## 2018-02-09 MED ORDER — EZETIMIBE 10 MG PO TABS: 10.0000 mg | ORAL_TABLET | Freq: Every day | ORAL | 5 refills | Status: DC

## 2018-02-09 NOTE — [place] Notes (Signed)
Patient ID: Anita Arroyo, female   DOB: Aug 11, 1960, 57 y.o.   MRN: [id number]     HPI: Anita Arroyo is a 58 y.o. female who presents for a 1 month cardiology evaluation.  I initially saw Anita Arroyo in March 2013 for evaluation of episodes of chest tightness with walking. She has a long-standing history of ongoing tobacco use, and reportedly it had a history of possible mitral valve prolapse. An echo Doppler study in April 2013 showed normal systolic function with grade 1 diastolic dysfunction. She had trace mitral regurgitation and frank mitral valve prolapse was not demonstrated. She did have mild pulmonary hypertension with estimated systolic pressure 34 mm. A nuclear perfusion study revealed normal perfusion.  She as ahistory of hyperlipidemia which was treated with Zetia. She has undergone left shoulder surgery by Dr. Percell Miller and had a ruptured by sepsis tendon. 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 Percell Miller, and history of bipolar polar disorder followed by Dr. Lovena Le as well as fibromyalgia.  Last year she lost approximately 21 pounds.  She has seen a nutritionist.  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  Unfortunately, she continues to smoke approximately 1-1/2ppd. She never had the prescription for Chantix filled.  She does have Mnire's disease for which she takes Dyazide.  She is on trazodone 200 mg at bedtime and also takes Lamictal 150 mg twice a day in addition to Effexor XR for her bipolar history.  She continues to take tamoxifen with her history of breast CA.  She has a history of palpitations and had been taking atenolol 50 mg in the morning and 25 mg at night. Due to increasing palpitations atenolol was increased to  50 mg twice a day with improvement.  He denies  any presyncope or syncope.    In 2016 a comprehensive metabolic panel, CBC, thyroid function studies were all entirely normal.  Lipid studies were excellent with a total cholesterol 144, triglycerides 80, HDL 49, LDL 79.  She will be establishing with a new primary care physician.  She was seen in August 2017 for preoperative clearance prior to undergoing shoulder surgery which was done by Dr. Percell Miller.  She tolerated her surgery well without cardiovascular compromise.  In February 2018, she was bitten by a cat in her leg and developed infection and cellulitis requiring antibiotic therapy.  In March 2018, she underwent a screening chest CT in light of her long-standing tobacco history.  This suggested atherosclerosis involving her left circumflex coronary artery and atherosclerosis in a nonaneurysmal thoracic aorta.  She continues to smoke cigarettes and has been smoking 1-1-1/2 packs per day, having started at age 61.  When I last saw her in May 2018, she had stopped taking her Lipitor and previously also been on Zetia, which she stopped secondary to cost.  With her atherosclerosis noted on CT imaging.  I recommended aggressive lipid management with target LDL less than 70.  She had not had recent lab work and as result, this was scheduled.  Her subsequent lipid panel revealed significant increasing cholesterol at 250, triglycerides 134, HDL 61, LDL 162.  We called in a prescription for Crestor 20 mg daily.  Her blood sugar was also mildly increased.  She apparently only took the Crestor for short duration  and then stopped taking this since she had felt weak and was concerned about possible myalgias.  She underwent an echo Doppler study which showed normal systolic function and grade 1 diastolic dysfunction.  An exercise nuclear study was performed in July 2018 which showed normal perfusion and function.  She denies recent chest pain.  She admits to being tired.  Her mother recently had abdominal hernia surgery.   Anita Arroyo denies any chest pain or palpitations.    Since I last saw her, she could not tolerate rosuvastatin and believes this because severe depression along with fatigue and myalgias.  In addition she could not tolerate atorvastatin.  She believes she may have tried Zetia but is uncertain about this and cannot remember why she stopped taking it.  She was seen by Joslyn Hy for consideration of PCSK9 inhibition and potential future use of been podalic acid once approved in early 2020.  She has continued to see Dr. Leonides Schanz for pulmonary issues.  Although she was referred to him for possible COPD, this was not detected on spirometry and she is not felt to have this.  She continues to undergo annual low-dose CT of her chest for lung cancer screening.  Her most recent evaluation was November 07, 2017.  She was found to have normal heart size.  There is suggestion of left circumflex coronary atherosclerosis as well as atherosclerotic nonaneurysmal thoracic aorta.  There is a new tiny anterior apical right upper lobe pulmonary nodule measuring 2.7 mm in volume which is felt to be benign in appearance.  She denies any chest pain.  She is unaware of palpitations.  Her last laboratory in November 2019 showed a BUN of 17 creatinine 0.93.  Lipid studies revealed elevated cholesterol 235, HDL 70, LDL 142, triglycerides 113.  Hemoglobin A1c was 5.6.  She was evaluated in the emergency room in mid December several days after falling when she tripped over her cat.  She is felt to have possible plateau fracture of her right knee.  She has seen Dr. Latanya Maudlin.  She is walking with crutches.  Smokes cigarettes 1-1/2 packs/day.  She states she has a prescription for Chantix and will be picking this up at the pharmacy.  She presents for reevaluation.   Past Medical History:  Diagnosis Date  . Arthritis   . Asthma   . Bipolar 1 disorder ([place])   . Bipolar 1 disorder ([place])   . Breast cancer ([place])   . Depression   .  Dysrhythmia    palpitations  . Dysrhythmia    atrial fibrillation  . Fibromyalgia   . Finger fracture 2011  . GERD (gastroesophageal reflux disease)   . Heart murmur   . History of stress test 04/28/2011   showed normal perfusion without scar or ischemia  . Hx of echocardiogram 04/28/2011   showed normal systolic function with mild diastolic dysfunction,she had trace MR but did not have frank mitral valve prolapse demonstrated. She had mild pulmonary hypertension with an estimated RV systolic pressure at 34 mm.  . Hyperlipemia   . Meniere disease   . MVP (mitral valve prolapse)   . Neuromuscular disorder ([place])    fibromyalgia    Past Surgical History:  Procedure Laterality Date  . ANKLE SURGERY     left x4  . BREAST SURGERY  2011   lumpectomy right breast   . COLONOSCOPY WITH PROPOFOL N/A 02/25/2014   Procedure: COLONOSCOPY WITH PROPOFOL;  Surgeon: Garlan Fair, MD;  Location: [facility];  Service: Endoscopy;  Laterality: N/A;  . deviated septum    . ECTOPIC PREGNANCY SURGERY    . ELBOW SURGERY     right elbow  . EYE SURGERY     cataract surgery  . fibroid     2-3 fibroid adenomas removed  . JOINT REPLACEMENT  2011   right knee   . KNEE SURGERY     left knee  . SHOULDER ARTHROSCOPY WITH BICEPSTENOTOMY Right 09/25/2015   Procedure: SHOULDER ARTHROSCOPY WITH BICEPSTENOTOMY;  Surgeon: Ninetta Lights, MD;  Location: [facility];  Service: Orthopedics;  Laterality: Right;  . SHOULDER ARTHROSCOPY WITH DISTAL CLAVICLE RESECTION Right 09/25/2015   Procedure: SHOULDER ARTHROSCOPY WITH DISTAL CLAVICLE RESECTION;  Surgeon: Ninetta Lights, MD;  Location: [facility];  Service: Orthopedics;  Laterality: Right;  . SHOULDER ARTHROSCOPY WITH SUBACROMIAL DECOMPRESSION Right 09/25/2015   Procedure: RIGHT SHOULDER ARTHROSCOPY DEBRIDEMENT,ACROMIOPLASTY,DISTAL CLAVICAL EXCISION, RELEASE OF BICEPS TENDON;  Surgeon: Ninetta Lights, MD;  Location: [facility];  Service: Orthopedics;  Laterality: Right;  . TMJ ARTHROPLASTY    . TONSILLECTOMY    . TOTAL SHOULDER ARTHROPLASTY Left 01/05/2016  . UTERINE FIBROID SURGERY     polups and fibroids removed   . WRIST SURGERY     left    Allergies  Allergen Reactions  . Contrast Media [Iodinated Diagnostic Agents] Anaphylaxis    Anaphylactic shock.  . Iodine Shortness Of Breath  . Adhesive [Tape] Other (See Comments)    Reaction:  Blisters   . Celecoxib     "I climb the walls"  . Statins     Body aches, constipation AND DEPRESSION  . Ceclor [Cefaclor] Rash  . Moxifloxacin Rash  . Sulfonamide Derivatives Nausea And Vomiting    Current Outpatient Medications  Medication Sig Dispense Refill  . albuterol (PROVENTIL HFA;VENTOLIN HFA) 108 (90 Base) MCG/ACT inhaler Inhale 2 puffs every 4 (four) hours as needed into the lungs for wheezing or shortness of breath (cough, shortness of breath or wheezing.). 3 Inhaler 0  . Alcohol Swabs (B-D SINGLE USE SWABS REGULAR) PADS     . atenolol (TENORMIN) 50 MG tablet Take 50 mg in the morning and 25 mg in the evening 135 tablet 1  . Biotin (BIOTIN 5000) 5 MG CAPS Take 5 mg by mouth daily.    . blood glucose meter kit and supplies KIT Dispense based on patient and insurance preference. Use daily as directed. (FOR ICD-9 250.00, 250.01). 1 each 0  . budesonide-formoterol (SYMBICORT) 80-4.5 MCG/ACT inhaler Inhale 2 puffs into the lungs 2 (two) times daily. 1 Inhaler 11  . cetirizine (ZYRTEC) 10 MG tablet Take 10 mg by mouth daily.     . diclofenac sodium (VOLTAREN) 1 % GEL     . ezetimibe (ZETIA) 10 MG tablet Take 1 tablet (10 mg total) by mouth daily. 30 tablet 5  . fluticasone (FLONASE) 50 MCG/ACT nasal spray USE 1 SPRAY IN EACH NOSTRIL TWICE DAILY 48 g 1  . HYDROcodone-acetaminophen (NORCO/VICODIN) 5-325 MG tablet take 1 TABLET BY MOUTH EVERY 6 HOURS AS NEEDED for moderate pain 120 tablet 0  . L-Methylfolate 15 MG TABS Take 1 tablet (15 mg total) by  mouth daily for 30 days. 30 tablet 5  . L-Methylfolate-Algae (DEPLIN 15 PO) Take 15 mg by mouth daily.    Marland Kitchen lamoTRIgine (LAMICTAL) 200 MG tablet Take 1/2 tab po q am and 2 tabs po QHS 315 tablet 0  . Magnesium 250 MG TABS Take  250 mg by mouth daily.    . meclizine (ANTIVERT) 25 MG tablet Take 1 tablet (25 mg total) by mouth as needed for dizziness. 30 tablet 1  . montelukast (SINGULAIR) 10 MG tablet Take 1 tablet (10 mg total) at bedtime by mouth. 90 tablet 3  . pantoprazole (PROTONIX) 40 MG tablet Take 1 tablet (40 mg total) by mouth daily. 90 tablet 1  . traZODone (DESYREL) 100 MG tablet Take 2 tablets (200 mg total) by mouth at bedtime. 60 tablet 0  . triamterene-hydrochlorothiazide (MAXZIDE-25) 37.5-25 MG tablet Take 0.5 tablets daily by mouth. 90 tablet 3  . TRUE METRIX BLOOD GLUCOSE TEST test strip     . TRUEPLUS LANCETS 33G MISC     . venlafaxine XR (EFFEXOR-XR) 150 MG 24 hr capsule Take 1 capsule (150 mg total) by mouth at bedtime. (Patient taking differently: Take 150 mg by mouth daily with breakfast. ) 30 capsule 0  . venlafaxine XR (EFFEXOR-XR) 37.5 MG 24 hr capsule Take 1 capsule with a 150 mg capsule to equal total daily dose of 187.5 mg po qd. 30 capsule 1   No current facility-administered medications for this visit.     Social History   Socioeconomic History  . Marital status: Single    Spouse name: Not on file  . Number of children: Not on file  . Years of education: Not on file  . Highest education level: Not on file  Occupational History  . Not on file  Social Needs  . Financial resource strain: Not on file  . Food insecurity:    Worry: Not on file    Inability: Not on file  . Transportation needs:    Medical: Not on file    Non-medical: Not on file  Tobacco Use  . Smoking status: Current Every Day Smoker    Packs/day: 1.50    Years: 40.00    Pack years: 60.00    Types: Cigarettes  . Smokeless tobacco: Never Used  . Tobacco comment: Cannot afford chantix.  Working on getting discount from drug compny  Substance and Sexual Activity  . Alcohol use: Yes    Alcohol/week: 0.0 standard drinks    Comment: occ  . Drug use: No  . Sexual activity: Not on file  Lifestyle  . Physical activity:    Days per week: Not on file    Minutes per session: Not on file  . Stress: Not on file  Relationships  . Social connections:    Talks on phone: Not on file    Gets together: Not on file    Attends religious service: Not on file    Active member of club or organization: Not on file    Attends meetings of clubs or organizations: Not on file    Relationship status: Not on file  . Intimate partner violence:    Fear of current or ex partner: Not on file    Emotionally abused: Not on file    Physically abused: Not on file    Forced sexual activity: Not on file  Other Topics Concern  . Not on file  Social History Narrative  . Not on file    Family History  Problem Relation Age of Onset  . Mitral valve prolapse Mother   . Heart Problems Mother   . Cancer Mother    Socially she is single. She has no children. She has been smoking for approximately 40 years and still smokes 1-11/2 pack per day per. 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   ROS General: Negative; No fevers, chills, or night sweats; positive for fatigue HEENT: Negative; No changes in vision or hearing, sinus congestion, difficulty swallowing Pulmonary: Positive for mild shortness of breath with activity Cardiovascular: Negative; No chest pain, presyncope, syncope, palpitations GI: Negative; No nausea, vomiting, diarrhea, or abdominal pain GU: Negative; No dysuria, hematuria, or difficulty voiding Musculoskeletal: Negative; no myalgias, joint pain, or weakness Hematologic/Oncology: Negative; no easy bruising, bleeding Endocrine: Negative; no heat/cold intolerance; no diabetes Neuro: Negative; no changes in balance, headaches Skin: Negative; No rashes or skin  lesions Psychiatric: Negative; No behavioral problems, depression Sleep: Negative; No snoring, daytime sleepiness, hypersomnolence, bruxism, restless legs, hypnogognic hallucinations, no cataplexy Other comprehensive 14 point system review is negative.   PE BP 126/62 (BP Location: Left Arm, Patient Position: Sitting, Cuff Size: Normal)   Pulse 69   Ht 5' 2.5" (1.588 m)   Wt 180 lb (81.6 kg)   LMP 06/25/2011   BMI 32.40 kg/m    Repeat blood pressure by me was 128/68.  Wt Readings from Last 3 Encounters:  02/09/18 180 lb (81.6 kg)  11/07/17 168 lb (76.2 kg)  11/03/17 168 lb (76.2 kg)   General: Alert, oriented, no distress.  Skin: normal turgor, no rashes, warm and dry HEENT: Normocephalic, atraumatic. Pupils equal round and reactive to light; sclera anicteric; extraocular muscles intact;  Nose without nasal septal hypertrophy Mouth/Parynx benign; Mallinpatti scale 3 Neck: No JVD, no carotid bruits; normal carotid upstroke Lungs: clear to ausculatation and percussion; no wheezing or rales Chest wall: without tenderness to palpitation Heart: PMI not displaced, RRR, s1 s2 normal, 1/6 systolic murmur, no diastolic murmur, no rubs, gallops, thrills, or heaves Abdomen: soft, nontender; no hepatosplenomehaly, BS+; abdominal aorta nontender and not dilated by palpation. Back: no CVA tenderness Pulses 2+ Musculoskeletal: full range of motion, normal strength, no joint deformities Extremities: R knee brace; no clubbing cyanosis, Homan's sign negative  Neurologic: grossly nonfocal; Cranial nerves grossly wnl Psychologic: Normal mood and affect  ECG (independently read by me): Normal sinus rhythm at 69 bpm.  Incomplete right bundle branch block.  LVH.  No ectopy.  Normal intervals.  October 2018 ECG (independently read by me): Normal sinus rhythm at 67 bpm.  LVH by voltage.  Nonspecific ST changes.  QTc interval 445 Anita.  May 2018 ECG (independently read by me): Normal sinus rhythm at 70  bpm.  Borderline voltage criteria for LVH.  QTc interval 451 Anita.  PR interval normal.  Nose ST-T change.  August 2016 ECG (independently read by me): Normal sinus rhythm at 65 bpm.  Mild RV conduction delay.  No securing ST-T changes.  May 2015 ECG (independently read by me): Normal sinus rhythm at 67 bpm.  QTc interval 443 Anita.  Nonspecific T-wave changes.  October 2014 ECG: Sinus rhythm at 69 beats per minute. Mild RV conduction delay. Normal intervals.  LABS: BMP Latest Ref Rng & Units 11/25/2017 01/28/2017 08/12/2016  Glucose 65 - 99 mg/dL 84 90 126(H)  BUN 6 - 24 mg/dL 17 16 17   Creatinine 0.57 - 1.00 mg/dL 0.93 0.92 1.02(H)  BUN/Creat Ratio 9 - 23 18 17 17   Sodium 134 - 144 mmol/L 140 141 141  Potassium 3.5 - 5.2 mmol/L 4.3 4.3 4.9  Chloride 96 - 106 mmol/L 98 100 101  CO2 20 - 29 mmol/L 27 26 26   Calcium 8.7 - 10.2 mg/dL 9.8 9.9 9.6   Hepatic Function Latest Ref Rng &  Units 11/25/2017 01/28/2017 08/12/2016  Total Protein 6.0 - 8.5 g/dL 6.7 6.7 6.8  Albumin 3.5 - 5.5 g/dL 4.7 4.6 4.6  AST 0 - 40 IU/L 21 22 26   ALT 0 - 32 IU/L 21 21 23   Alk Phosphatase 39 - 117 IU/L 109 83 90  Total Bilirubin 0.0 - 1.2 mg/dL <0.2 0.3 0.3  Bilirubin, Direct 0.00 - 0.40 mg/dL 0.08 - -   CBC Latest Ref Rng & Units 08/12/2016 03/13/2016 08/20/2014  WBC 3.4 - 10.8 x10E3/uL 6.4 10.6(H) 5.6  Hemoglobin 11.1 - 15.9 g/dL 14.2 14.9 13.7  Hematocrit 34.0 - 46.6 % 41.9 43.4 40.3  Platelets 150 - 379 x10E3/uL 230 186 179   Lab Results  Component Value Date   MCV 93 08/12/2016   MCV 89.1 03/13/2016   MCV 94.1 08/20/2014   Lab Results  Component Value Date   TSH 0.804 08/12/2016   Lab Results  Component Value Date   HGBA1C 5.6 01/28/2017    Lipid Panel     Component Value Date/Time   CHOL 235 (H) 11/25/2017 1155   TRIG 113 11/25/2017 1155   HDL 70 11/25/2017 1155   CHOLHDL 3.4 11/25/2017 1155   CHOLHDL 2.9 11/22/2013 0904   VLDL 16 11/22/2013 0904   LDLCALC 142 (H) 11/25/2017 1155      RADIOLOGY: Dg Chest 2 View  11/10/2012   CLINICAL DATA:  Cough and chest pain  EXAM: CHEST  2 VIEW  COMPARISON:  July 30, 2012  FINDINGS: There is a calcified granuloma in the left lower lobe. Lungs are otherwise clear. Heart size and pulmonary vascularity are normal. No adenopathy. There are no appreciable bone lesions.  IMPRESSION: Granuloma left base. No edema or consolidation   Electronically Signed   By: Lowella Grip M.D.   On: 11/10/2012 15:22   Mr Shoulder Left Wo Contrast  11/12/2012   CLINICAL DATA:  Extreme shoulder pain for months. Prior shoulder surgery approximately 1 year ago.  EXAM: MRI OF THE LEFT SHOULDER WITHOUT CONTRAST  TECHNIQUE: Multiplanar, multisequence MR imaging of the shoulder was performed. No intravenous contrast was administered.  COMPARISON:  Left shoulder MRI 06/04/2011.  FINDINGS: Rotator cuff: Interval development of infraspinous tendinosis with hyperintensity extending to the musculotendinous junction. No focal tear is demonstrated. There is stable mild supraspinatus and subscapularis tendinosis.  Muscles:  No focal muscular atrophy or edema.  Biceps long head: The intra-articular portion of the biceps tendon is now diminutive with irregular signal consistent with tendinosis and partial tearing. No full-thickness tendon tear or tendon dislocation is identified.  Acromioclavicular Joint: The acromion is type 1. Patient has undergone interval distal clavicle resection and probable acromioplasty. There is no osseous encroachment on the rotator cuff passageway. There is no significant fluid in the subacromial -subdeltoid space.  Glenohumeral Joint: No significant shoulder joint effusion. The previously demonstrated edema and cystic changes superiorly in the glenoid have improved. However, there is new edema and subchondral cyst formation in the anterior inferior glenoid.  Labrum: There is labral degeneration without evidence of discrete tear.  Bones: No significant  extra-articular osseous findings. The cystic changes previously noted medial to the lesser tuberosity have improved.  IMPRESSION: 1. Interval distal clavicle resection and acromioplasty. No rotator cuff impingement identified. 2. Infraspinous tendinosis without evidence of rotator cuff tear. 3. New diminution of the long head of the biceps tendon consistent with partial tearing. 4. Progressive glenohumeral degenerative changes with new subchondral cyst formation and edema in the anterior inferior glenoid. No  discrete labral tear identified.   Electronically Signed   By: Camie Patience M.D.   On: 11/12/2012 12:14    IMPRESSION:  1. Palpitations   2. Atherosclerosis of native coronary artery of native heart without angina pectoris   3. Essential hypertension   4. Hyperlipidemia, unspecified hyperlipidemia type   5. Aortic atherosclerosis ([place])   6. Tobacco abuse   7. Hyperlipidemia with target LDL less than 70   8. Grade I diastolic dysfunction   9. Statin intolerance   10. SOB (shortness of breath) on exertion     ASSESSMENT AND PLAN: Anita Arroyo is a 58 year old female who has a 60+ pack year history of tobacco use.  She has a history of palpitations and being on atenolol morning and 25 mg in the evening.,  She has a greater than 60-pack year history of tobacco use used to smoke 1-1/2 packs of cigarettes per day.  Surveillance low-dose CT imaging has been relatively stable.  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  Anginal type symptomatology.  Having hyperlipidemia and apparently has been intolerant to statin therapy.  There is a report that she not tolerate Zetia but she cannot remember if she did start this and if so what side effects she had.  Filled out paperwork for possible PCSK9 inhibition and I will further discuss this with Kristen Altstadt.  Her brother had suffered a myocardial infarction at age 23.   Has been documented to have a small probably benign lung nodule which is being followed closely.  I have again reviewed her echo Doppler study which showed normal systolic function with grade 1 diastolic dysfunction.  A nuclear perfusion study from July 2018 was low risk with post-rest EF at 58%.  Her palpitations have continued to be controlled with atenolol.  I again diiscussed the importance of smoking cessation.  Followed by Dr. Rolland Porter is now on Symbicort in place of Advair and also takes Singulair.  She denies recent wheezing.  She has a biipolar disorder and continues to be on lamictal, Effexor and Desyrel.  She will undergo repeat laboratory in 3 months and I will see her in 4 months for follow-up evaluation.   Time spent: 25 minutes  Troy Sine, MD, [facility]  02/11/2018 10:58 AM

## 2018-02-09 NOTE — Patient Instructions (Signed)
Medication Instructions:  The current medical regimen is effective;  continue present plan and medications.  If you need a refill on your cardiac medications before your next appointment, please call your pharmacy.   Lab work: Lipid 3 months If you have labs (blood work) drawn today and your tests are completely normal, you will receive your results only by: Marland Kitchen MyChart Message (if you have MyChart) OR . A paper copy in the mail If you have any lab test that is abnormal or we need to change your treatment, we will call you to review the results.   Follow-Up: At [facility], you and your health needs are our priority.  As part of our continuing mission to provide you with exceptional heart care, we have created designated Provider Care Teams.  These Care Teams include your primary Cardiologist (physician) and Advanced Practice Providers (APPs -  Physician Assistants and Nurse Practitioners) who all work together to provide you with the care you need, when you need it. You will need a follow up appointment in 4 months.  Please call our office1 months in advance to schedule this appointment.  You may see Dr.Kelly or one of the following Advanced Practice Providers on your designated Care Team: Almyra Deforest, [place] . Fabian Sharp, PA-C

## 2018-02-11 ENCOUNTER — Encounter: Payer: Self-pay | Admitting: Cardiovascular Disease

## 2018-02-13 ENCOUNTER — Ambulatory Visit (INDEPENDENT_AMBULATORY_CARE_PROVIDER_SITE_OTHER): Payer: Medicare HMO | Admitting: Internal Medicine

## 2018-02-13 ENCOUNTER — Ambulatory Visit: Payer: Medicare HMO | Admitting: Internal Medicine

## 2018-02-13 DIAGNOSIS — R0609 Other forms of dyspnea: Secondary | ICD-10-CM | POA: Diagnosis not present

## 2018-02-13 LAB — PULMONARY FUNCTION TEST
DL/VA % pred: 82 %
DL/VA: 3.8 ml/min/mmHg/L
DLCO unc % pred: 74 %
DLCO unc: 16.59 ml/min/mmHg
FEF 25-75 POST: 3.41 L/s
FEF 25-75 PRE: 2.65 L/s
FEF2575-%CHANGE-POST: 28 %
FEF2575-%PRED-PRE: 112 %
FEF2575-%Pred-Post: 145 %
FEV1-%Change-Post: 5 %
FEV1-%PRED-POST: 97 %
FEV1-%PRED-PRE: 91 %
FEV1-PRE: 2.26 L
FEV1-Post: 2.4 L
FEV1FVC-%Change-Post: 1 %
FEV1FVC-%PRED-PRE: 108 %
FEV6-%Change-Post: 4 %
FEV6-%PRED-POST: 89 %
FEV6-%Pred-Pre: 86 %
FEV6-POST: 2.75 L
FEV6-Pre: 2.64 L
FEV6FVC-%CHANGE-POST: 0 %
FEV6FVC-%PRED-POST: 103 %
FEV6FVC-%Pred-Pre: 103 %
FVC-%CHANGE-POST: 4 %
FVC-%Pred-Post: 87 %
FVC-%Pred-Pre: 83 %
FVC-POST: 2.77 L
FVC-Pre: 2.65 L
POST FEV1/FVC RATIO: 86 %
PRE FEV1/FVC RATIO: 85 %
Post FEV6/FVC ratio: 100 %
Pre FEV6/FVC Ratio: 100 %

## 2018-02-13 NOTE — [place] Notes (Signed)
PFT done today. 

## 2018-02-16 ENCOUNTER — Ambulatory Visit: Payer: Medicare HMO | Admitting: Internal Medicine

## 2018-02-22 ENCOUNTER — Ambulatory Visit: Payer: Medicare HMO | Admitting: Internal Medicine

## 2018-03-03 ENCOUNTER — Other Ambulatory Visit: Payer: Self-pay

## 2018-03-03 ENCOUNTER — Telehealth: Payer: Self-pay | Admitting: Psychiatry

## 2018-03-03 DIAGNOSIS — F3162 Bipolar disorder, current episode mixed, moderate: Secondary | ICD-10-CM

## 2018-03-03 MED ORDER — VENLAFAXINE HCL ER 150 MG PO CP24: 150.0000 mg | ORAL_CAPSULE | Freq: Every day | ORAL | 0 refills | Status: DC

## 2018-03-03 MED ORDER — VENLAFAXINE HCL ER 150 MG PO CP24: 150.0000 mg | ORAL_CAPSULE | Freq: Every day | ORAL | 1 refills | Status: DC

## 2018-03-03 MED ORDER — TRAZODONE HCL 100 MG PO TABS: 200.0000 mg | ORAL_TABLET | Freq: Every day | ORAL | 0 refills | Status: DC

## 2018-03-03 MED ORDER — VENLAFAXINE HCL ER 37.5 MG PO CP24: ORAL_CAPSULE | ORAL | 1 refills | Status: DC

## 2018-03-03 MED ORDER — TRAZODONE HCL 100 MG PO TABS: 200.0000 mg | ORAL_TABLET | Freq: Every day | ORAL | 1 refills | Status: DC

## 2018-03-03 NOTE — Telephone Encounter (Signed)
All rx's submitted

## 2018-03-03 NOTE — Telephone Encounter (Signed)
Anita Arroyo called to report that she needs her Trazodone and Effexor (both 150mg  and 37.5mg ) sent to her mail order - [facility].  She will also need local prescriptions for trazodone and effexor 150mg  sent in the tie her over until the mail order is processed and delivered. She already has a local refill on the effexor 37.5. - Pharmacy is [facility].  Next appt 03/07/18

## 2018-03-07 ENCOUNTER — Encounter: Payer: Self-pay | Admitting: Psychiatry

## 2018-03-07 ENCOUNTER — Ambulatory Visit: Payer: Medicare HMO | Admitting: Psychiatry

## 2018-03-07 VITALS — BP 109/62 | HR 66

## 2018-03-07 DIAGNOSIS — F5101 Primary insomnia: Secondary | ICD-10-CM

## 2018-03-07 DIAGNOSIS — F3162 Bipolar disorder, current episode mixed, moderate: Secondary | ICD-10-CM | POA: Diagnosis not present

## 2018-03-07 MED ORDER — TRAZODONE HCL 100 MG PO TABS: 200.0000 mg | ORAL_TABLET | Freq: Every day | ORAL | 1 refills | Status: DC

## 2018-03-07 MED ORDER — VENLAFAXINE HCL ER 75 MG PO CP24: 225.0000 mg | ORAL_CAPSULE | Freq: Every day | ORAL | 0 refills | Status: DC

## 2018-03-07 MED ORDER — LAMOTRIGINE 200 MG PO TABS: ORAL_TABLET | ORAL | 1 refills | Status: DC

## 2018-03-07 MED ORDER — L-METHYLFOLATE 15 MG PO TABS: 15.0000 mg | ORAL_TABLET | Freq: Every day | ORAL | 5 refills | Status: DC

## 2018-03-07 NOTE — Progress Notes (Signed)
Anita DELAWDER [id number] 1960-07-29 58 y.o.  Subjective:   Patient ID:  Anita Arroyo is a 58 y.o. (DOB [birth year]) female.  Chief Complaint:  Chief Complaint  Patient presents with  . Depression  . ADHD    HPI Anita Arroyo presents to the office today for follow-up of depression and anxiety. She reports that she realized today that she forgot to have script transferred to another pharmacy due to cost and has not started [place]. She reports that her mood remains depressed and she has "no motivation at all." Energy has also been low. She reports that she is behind on house work and when she looks at her house she feels overwhelmed. She reports that she has been taking multiple naps daily. Reports adequate sleep at night and sleeping 7-8 hours a night. She reports that the last few days she has noticed some slight increase in motivation and has thought about doing some more things. She reports that mood has been irritable. Denies anxiety. She reports that her appetite has been increased and has been wanting more "junk" food. Reports gaining about 25 lbs in the last couple of months. Concentration has been "ok." Has been working jigsaw puzzles and finds this relaxing. Denies risky or impulsive behaviors. Denies SI.   She reports that as of yesterday she no longer needs to use crutches. Reports that recent circumstances "brought me down" and has noticed some mild improvement in mood s/s since no longer need crutches.   Review of Systems:  Review of Systems  Constitutional:       Hot flashes.   HENT: Positive for congestion.   Endocrine: Positive for heat intolerance.  Musculoskeletal: Negative for gait problem.       Continues to wear boot and no longer needs crutches.   Neurological: Negative for tremors.  Psychiatric/Behavioral:       Please refer to HPI    Medications: I have reviewed the patient's current medications.  Current Outpatient Medications  Medication Sig Dispense Refill  .  albuterol (PROVENTIL HFA;VENTOLIN HFA) 108 (90 Base) MCG/ACT inhaler Inhale 2 puffs every 4 (four) hours as needed into the lungs for wheezing or shortness of breath (cough, shortness of breath or wheezing.). 3 Inhaler 0  . Alcohol Swabs (B-D SINGLE USE SWABS REGULAR) PADS     . atenolol (TENORMIN) 50 MG tablet Take 50 mg in the morning and 25 mg in the evening 135 tablet 1  . Biotin (BIOTIN 5000) 5 MG CAPS Take 5 mg by mouth daily.    . blood glucose meter kit and supplies KIT Dispense based on patient and insurance preference. Use daily as directed. (FOR ICD-9 250.00, 250.01). 1 each 0  . budesonide-formoterol (SYMBICORT) 80-4.5 MCG/ACT inhaler Inhale 2 puffs into the lungs 2 (two) times daily. 1 Inhaler 11  . cetirizine (ZYRTEC) 10 MG tablet Take 10 mg by mouth daily.     . diclofenac sodium (VOLTAREN) 1 % GEL     . ezetimibe (ZETIA) 10 MG tablet Take 1 tablet (10 mg total) by mouth daily. 30 tablet 5  . fluticasone (FLONASE) 50 MCG/ACT nasal spray USE 1 SPRAY IN EACH NOSTRIL TWICE DAILY 48 g 1  . HYDROcodone-acetaminophen (NORCO/VICODIN) 5-325 MG tablet take 1 TABLET BY MOUTH EVERY 6 HOURS AS NEEDED for moderate pain 120 tablet 0  . L-Methylfolate 15 MG TABS Take 1 tablet (15 mg total) by mouth daily for 30 days. 30 tablet 5  . lamoTRIgine (LAMICTAL) 200 MG tablet Take  1/2 tab po q am and 2 tabs po QHS 315 tablet 1  . Magnesium 250 MG TABS Take 250 mg by mouth daily.    . meclizine (ANTIVERT) 25 MG tablet Take 1 tablet (25 mg total) by mouth as needed for dizziness. 30 tablet 1  . montelukast (SINGULAIR) 10 MG tablet Take 1 tablet (10 mg total) at bedtime by mouth. 90 tablet 3  . pantoprazole (PROTONIX) 40 MG tablet Take 1 tablet (40 mg total) by mouth daily. 90 tablet 1  . traZODone (DESYREL) 100 MG tablet Take 2 tablets (200 mg total) by mouth at bedtime. 30 tablet 1  . triamterene-hydrochlorothiazide (MAXZIDE-25) 37.5-25 MG tablet Take 0.5 tablets daily by mouth. 90 tablet 3  . TRUE METRIX  BLOOD GLUCOSE TEST test strip     . TRUEPLUS LANCETS 33G MISC     . venlafaxine XR (EFFEXOR XR) 75 MG 24 hr capsule Take 3 capsules (225 mg total) by mouth daily with breakfast. 270 capsule 0   No current facility-administered medications for this visit.     Medication Side Effects: None  Allergies:  Allergies  Allergen Reactions  . Contrast Media [Iodinated Diagnostic Agents] Anaphylaxis    Anaphylactic shock.  . Iodine Shortness Of Breath  . Adhesive [Tape] Other (See Comments)    Reaction:  Blisters   . Celecoxib     "I climb the walls"  . Statins     Body aches, constipation AND DEPRESSION  . Ceclor [Cefaclor] Rash  . Moxifloxacin Rash  . Sulfonamide Derivatives Nausea And Vomiting    Past Medical History:  Diagnosis Date  . Arthritis   . Asthma   . Bipolar 1 disorder ([place])   . Bipolar 1 disorder ([place])   . Breast cancer ([place])   . Depression   . Dysrhythmia    palpitations  . Dysrhythmia    atrial fibrillation  . Fibromyalgia   . Finger fracture 2011  . GERD (gastroesophageal reflux disease)   . Heart murmur   . History of stress test 04/28/2011   showed normal perfusion without scar or ischemia  . Hx of echocardiogram 04/28/2011   showed normal systolic function with mild diastolic dysfunction,she had trace MR but did not have frank mitral valve prolapse demonstrated. She had mild pulmonary hypertension with an estimated RV systolic pressure at 34 mm.  . Hyperlipemia   . Meniere disease   . MVP (mitral valve prolapse)   . Neuromuscular disorder ([place])    fibromyalgia    Family History  Problem Relation Age of Onset  . Mitral valve prolapse Mother   . Heart Problems Mother   . Cancer Mother     Social History   Socioeconomic History  . Marital status: Single    Spouse name: Not on file  . Number of children: Not on file  . Years of education: Not on file  . Highest education level: Not on file  Occupational History  . Not on file  Social Needs   . Financial resource strain: Not on file  . Food insecurity:    Worry: Not on file    Inability: Not on file  . Transportation needs:    Medical: Not on file    Non-medical: Not on file  Tobacco Use  . Smoking status: Current Every Day Smoker    Packs/day: 1.50    Years: 40.00    Pack years: 60.00    Types: Cigarettes  . Smokeless tobacco: Never Used  . Tobacco comment:  Cannot afford chantix. Working on getting discount from drug compny  Substance and Sexual Activity  . Alcohol use: Yes    Alcohol/week: 0.0 standard drinks    Comment: occ  . Drug use: No  . Sexual activity: Not on file  Lifestyle  . Physical activity:    Days per week: Not on file    Minutes per session: Not on file  . Stress: Not on file  Relationships  . Social connections:    Talks on phone: Not on file    Gets together: Not on file    Attends religious service: Not on file    Active member of club or organization: Not on file    Attends meetings of clubs or organizations: Not on file    Relationship status: Not on file  . Intimate partner violence:    Fear of current or ex partner: Not on file    Emotionally abused: Not on file    Physically abused: Not on file    Forced sexual activity: Not on file  Other Topics Concern  . Not on file  Social History Narrative  . Not on file    Past Medical History, Surgical history, Social history, and Family history were reviewed and updated as appropriate.   Please see review of systems for further details on the patient's review from today.   Objective:   Physical Exam:  BP 109/62   Pulse 66   LMP 06/25/2011   Physical Exam Constitutional:      General: She is not in acute distress.    Appearance: She is well-developed.  Musculoskeletal:        General: No deformity.  Neurological:     Mental Status: She is alert and oriented to person, place, and time.     Coordination: Coordination normal.  Psychiatric:        Attention and Perception:  Attention and perception normal. She does not perceive auditory or visual hallucinations.        Mood and Affect: Mood is depressed. Mood is not anxious. Affect is not labile, blunt, angry or inappropriate.        Speech: Speech normal.        Behavior: Behavior normal.        Thought Content: Thought content normal. Thought content does not include homicidal or suicidal ideation. Thought content does not include homicidal or suicidal plan.        Cognition and Memory: Cognition and memory normal.        Judgment: Judgment normal.     Comments: Insight intact. No delusions.      Lab Review:     Component Value Date/Time   NA 140 11/25/2017 1155   NA 138 08/20/2014 1117   K 4.3 11/25/2017 1155   K 4.7 08/20/2014 1117   CL 98 11/25/2017 1155   CL 104 06/29/2012 1221   CO2 27 11/25/2017 1155   CO2 26 08/20/2014 1117   GLUCOSE 84 11/25/2017 1155   GLUCOSE 82 03/13/2016 1720   GLUCOSE 92 08/20/2014 1117   GLUCOSE 107 (H) 06/29/2012 1221   BUN 17 11/25/2017 1155   BUN 16.0 08/20/2014 1117   CREATININE 0.93 11/25/2017 1155   CREATININE 0.9 08/20/2014 1117   CALCIUM 9.8 11/25/2017 1155   CALCIUM 9.4 08/20/2014 1117   PROT 6.7 11/25/2017 1155   PROT 6.6 08/20/2014 1117   ALBUMIN 4.7 11/25/2017 1155   ALBUMIN 3.9 08/20/2014 1117   AST 21 11/25/2017 1155  AST 22 08/20/2014 1117   ALT 21 11/25/2017 1155   ALT 18 08/20/2014 1117   ALKPHOS 109 11/25/2017 1155   ALKPHOS 54 08/20/2014 1117   BILITOT <0.2 11/25/2017 1155   BILITOT 0.47 08/20/2014 1117   GFRNONAA 68 11/25/2017 1155   GFRAA 78 11/25/2017 1155       Component Value Date/Time   WBC 6.4 08/12/2016 1659   WBC 10.6 (H) 03/13/2016 1720   RBC 4.53 08/12/2016 1659   RBC 4.87 03/13/2016 1720   HGB 14.2 08/12/2016 1659   HGB 13.7 08/20/2014 1117   HCT 41.9 08/12/2016 1659   HCT 40.3 08/20/2014 1117   PLT 230 08/12/2016 1659   MCV 93 08/12/2016 1659   MCV 94.1 08/20/2014 1117   MCH 31.3 08/12/2016 1659   MCH 30.6  03/13/2016 1720   MCHC 33.9 08/12/2016 1659   MCHC 34.3 03/13/2016 1720   RDW 14.1 08/12/2016 1659   RDW 13.2 08/20/2014 1117   LYMPHSABS 2.4 03/13/2016 1720   LYMPHSABS 2.0 08/20/2014 1117   MONOABS 0.9 03/13/2016 1720   MONOABS 0.5 08/20/2014 1117   EOSABS 0.0 03/13/2016 1720   EOSABS 0.1 08/20/2014 1117   BASOSABS 0.0 03/13/2016 1720   BASOSABS 0.1 08/20/2014 1117    No results found for: POCLITH, LITHIUM   No results found for: PHENYTOIN, PHENOBARB, VALPROATE, CBMZ   .res Assessment: Plan:   Will increase Effexor XR to 225 mg po qd to improve depressive s/s since pt has had a slight improvement in mood s/s with lower doses without tolerability issues. Continue Lamictal 100 mg po q am and 200 mg po q evening since this has been effective for mood s/s. Continue Trazodone 200 mg po QHS for insomnia.  Patient advised to contact office with any questions, adverse effects, or acute worsening in signs and symptoms.  Bipolar 1 disorder, mixed, moderate (HCC) - Plan: lamoTRIgine (LAMICTAL) 200 MG tablet, venlafaxine XR (EFFEXOR XR) 75 MG 24 hr capsule, L-Methylfolate 15 MG TABS  Primary insomnia - Plan: traZODone (DESYREL) 100 MG tablet  Please see After Visit Summary for patient specific instructions.  Future Appointments  Date Time Provider [place]  05/17/2018  1:00 PM Thayer Headings, PMHNP [facility] None    No orders of the defined types were placed in this encounter.     -------------------------------

## 2018-03-10 ENCOUNTER — Other Ambulatory Visit: Payer: Self-pay

## 2018-04-19 ENCOUNTER — Telehealth: Payer: Medicare HMO | Admitting: Family

## 2018-04-19 DIAGNOSIS — L259 Unspecified contact dermatitis, unspecified cause: Secondary | ICD-10-CM | POA: Diagnosis not present

## 2018-04-19 MED ORDER — TRIAMCINOLONE ACETONIDE 0.1 % EX CREA: 1.0000 "application " | TOPICAL_CREAM | Freq: Two times a day (BID) | CUTANEOUS | 0 refills | Status: DC

## 2018-04-19 NOTE — Progress Notes (Signed)
E Visit for Rash  We are sorry that you are not feeling well. Here is how we plan to help!  Based on what you shared with me it looks like you have contact dermatitis.  Contact dermatitis is a skin rash caused by something that touches the skin and causes irritation or inflammation.  Your skin may be red, swollen, dry, cracked, and itch.  The rash should go away in a few days but can last a few weeks.  If you get a rash, it's important to figure out what caused it so the irritant can be avoided in the future.  I have sent in a prescription for Triamcinolone .1% to the pharmacy   HOME CARE:   Take cool showers and avoid direct sunlight.  Apply cool compress or wet dressings.  Take a bath in an oatmeal bath.  Sprinkle content of one Aveeno packet under running faucet with comfortably warm water.  Bathe for 15-20 minutes, 1-2 times daily.  Pat dry with a towel. Do not rub the rash.  Use hydrocortisone cream.  Take an antihistamine like Benadryl for widespread rashes that itch.  The adult dose of Benadryl is 25-50 mg by mouth 4 times daily.  Caution:  This type of medication may cause sleepiness.  Do not drink alcohol, drive, or operate dangerous machinery while taking antihistamines.  Do not take these medications if you have prostate enlargement.  Read package instructions thoroughly on all medications that you take.  GET HELP RIGHT AWAY IF:   Symptoms don't go away after treatment.  Severe itching that persists.  If you rash spreads or swells.  If you rash begins to smell.  If it blisters and opens or develops a yellow-brown crust.  You develop a fever.  You have a sore throat.  You become short of breath.  MAKE SURE YOU:  Understand these instructions. Will watch your condition. Will get help right away if you are not doing well or get worse.  Thank you for choosing an e-visit. Your e-visit answers were reviewed by a board certified advanced clinical practitioner to  complete your personal care plan. Depending upon the condition, your plan could have included both over the counter or prescription medications. Please review your pharmacy choice. Be sure that the pharmacy you have chosen is open so that you can pick up your prescription now.  If there is a problem you may message your provider in [place] to have the prescription routed to another pharmacy. Your safety is important to [place]. If you have drug allergies check your prescription carefully.  For the next 24 hours, you can use MyChart to ask questions about today's visit, request a non-urgent call back, or ask for a work or school excuse from your e-visit provider. You will get an email in the next two days asking about your experience. I hope that your e-visit has been valuable and will speed your recovery.

## 2018-05-09 DIAGNOSIS — S62617A Displaced fracture of proximal phalanx of left little finger, initial encounter for closed fracture: Secondary | ICD-10-CM | POA: Insufficient documentation

## 2018-05-17 ENCOUNTER — Encounter: Payer: Self-pay | Admitting: Psychiatry

## 2018-05-17 ENCOUNTER — Ambulatory Visit (INDEPENDENT_AMBULATORY_CARE_PROVIDER_SITE_OTHER): Payer: Medicare HMO | Admitting: Psychiatry

## 2018-05-17 ENCOUNTER — Other Ambulatory Visit: Payer: Self-pay

## 2018-05-17 DIAGNOSIS — F3162 Bipolar disorder, current episode mixed, moderate: Secondary | ICD-10-CM

## 2018-05-17 DIAGNOSIS — F5101 Primary insomnia: Secondary | ICD-10-CM | POA: Diagnosis not present

## 2018-05-17 IMAGING — MR MR SHOULDER*R* W/O CM
5 series · 35 of 40 positions shown · non-contrast
Comparison: 03/11/2015

CLINICAL DATA: Anterior right shoulder pain for 3 months with
reduced range of motion.

EXAM:
MRI OF THE RIGHT SHOULDER WITHOUT CONTRAST
TECHNIQUE: Multiplanar, multisequence MR imaging of the shoulder was performed.
No intravenous contrast was administered.

[Series 3: T2 fat-sat · axial · 4.0mm · 0.55mm/px · z∈[-22,+67]mm · 8 of 20 slices shown (1 of 3)]
[im 1/20]
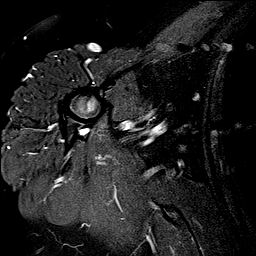
[im 3/20]
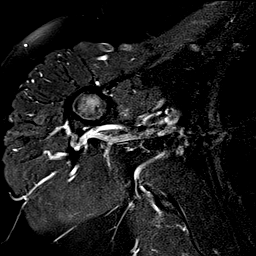
[im 6/20]
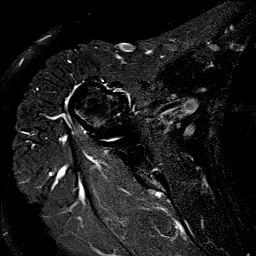
[im 9/20]
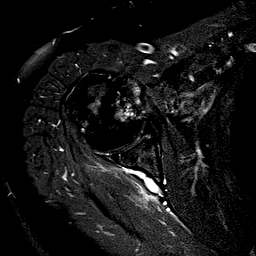
[im 11/20]
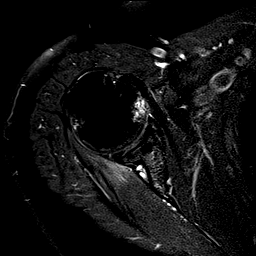
[im 14/20]
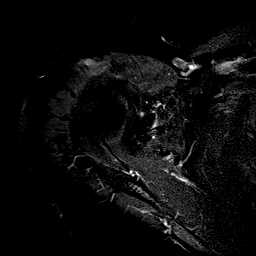
[im 17/20]
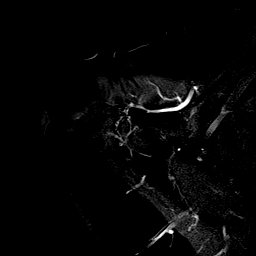
[im 20/20]
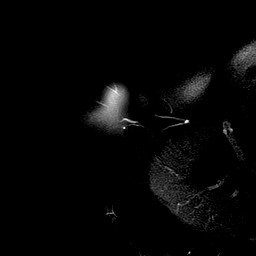

[Series 4: T2 fat-sat · oblique · 4.0mm · 0.55mm/px · 8 of 18 slices shown (2 of 3)]
[im 1/18]
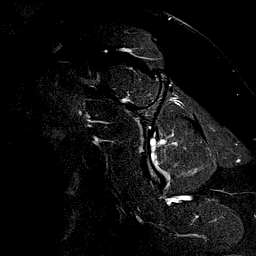
[im 3/18]
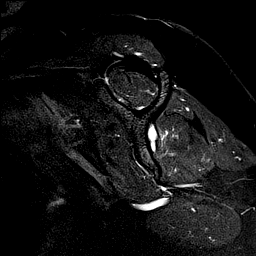
[im 5/18]
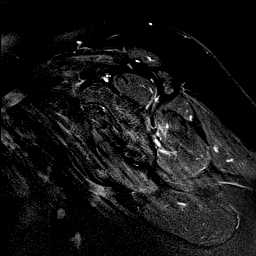
[im 8/18]
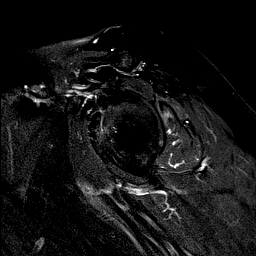
[im 10/18]
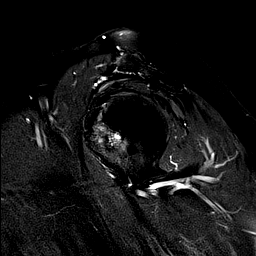
[im 13/18]
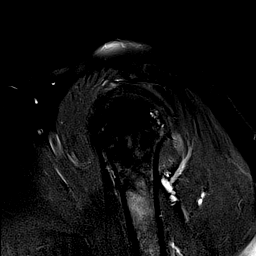
[im 15/18]
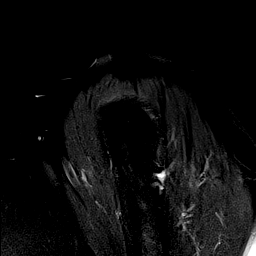
[im 18/18]
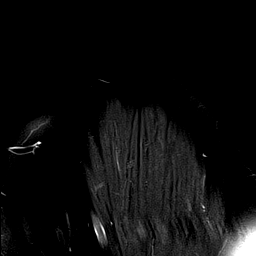

[Series 5: T2 fat-sat · oblique · 4.0mm · 0.27mm/px · 8 of 17 slices shown (3 of 3)]
[im 1/17]
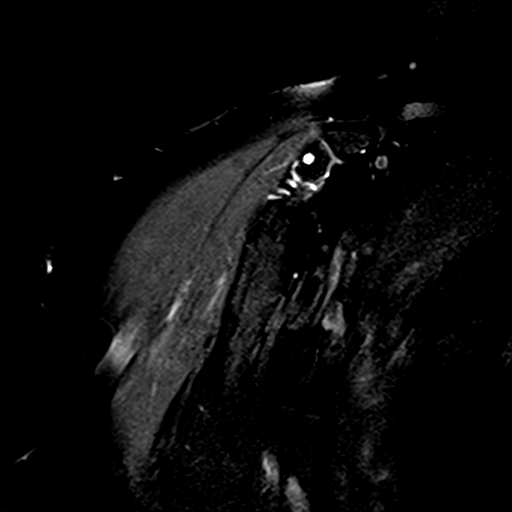
[im 3/17]
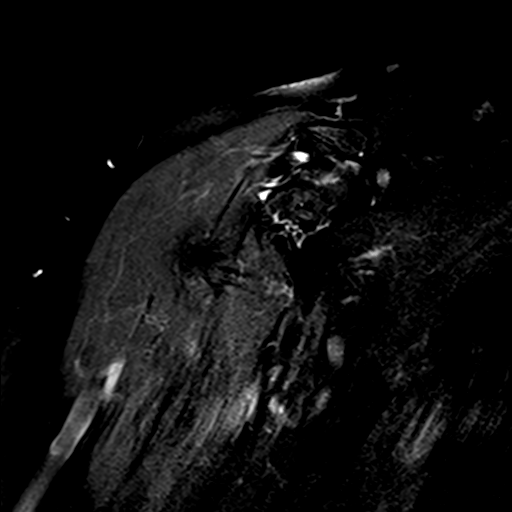
[im 5/17]
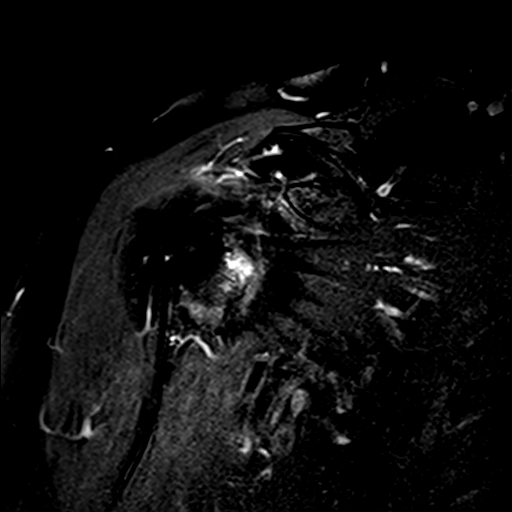
[im 7/17]
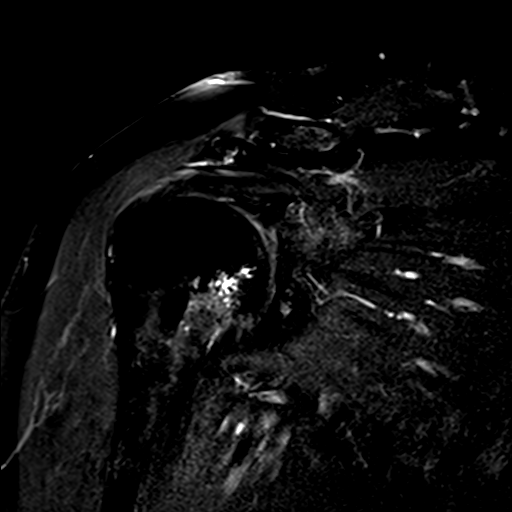
[im 10/17]
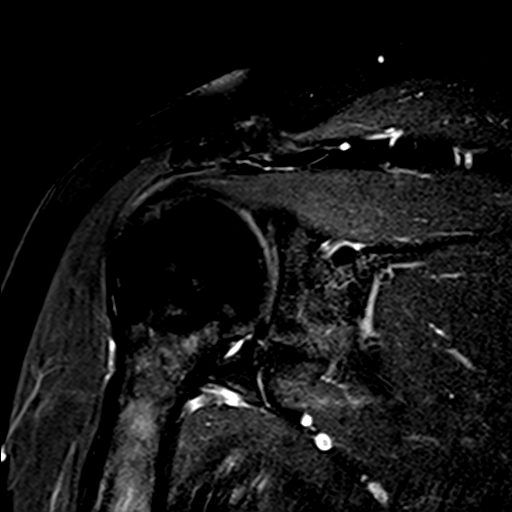
[im 12/17]
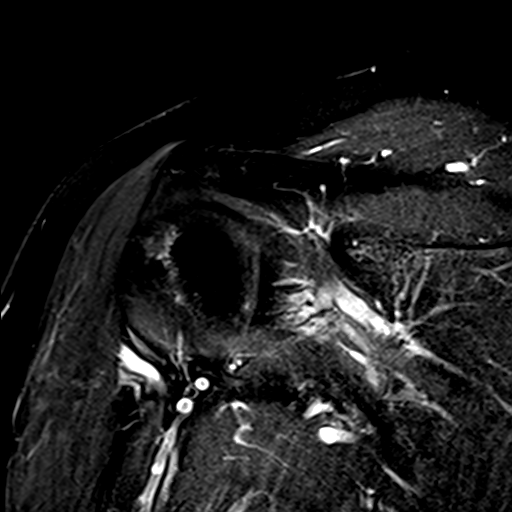
[im 14/17]
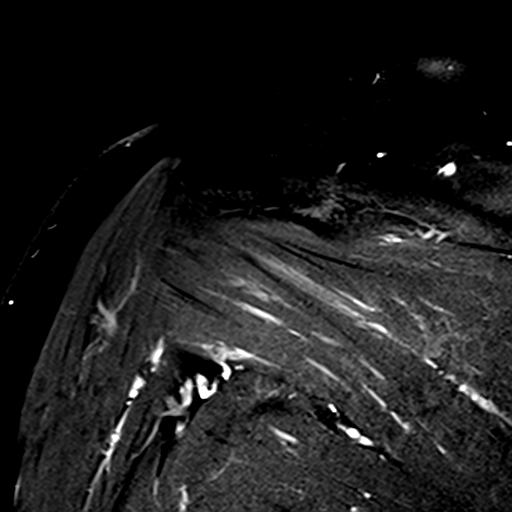
[im 17/17]
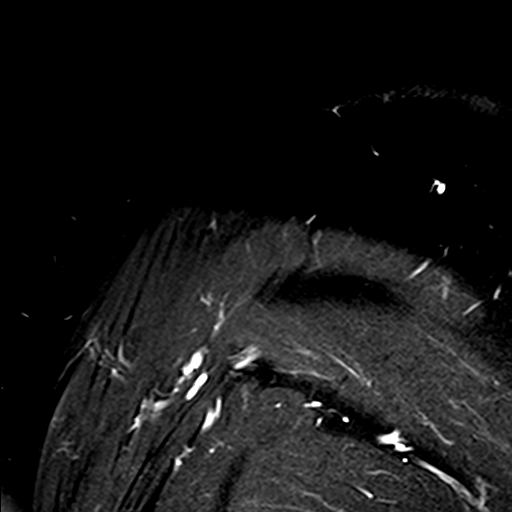

[Series 6: T1 · oblique · 4.0mm · 0.27mm/px · 3 of 18 slices shown]
[im 1/18]
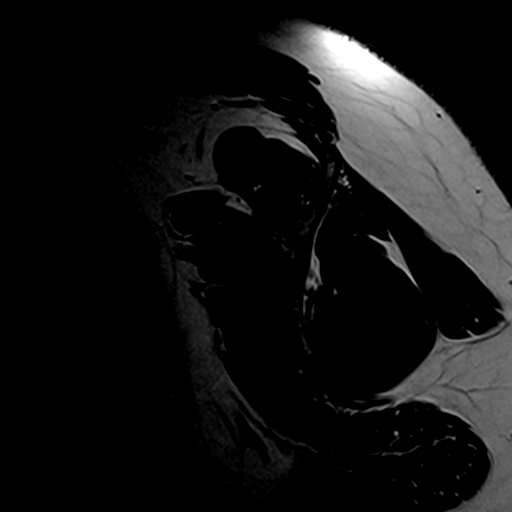
[im 3/18]
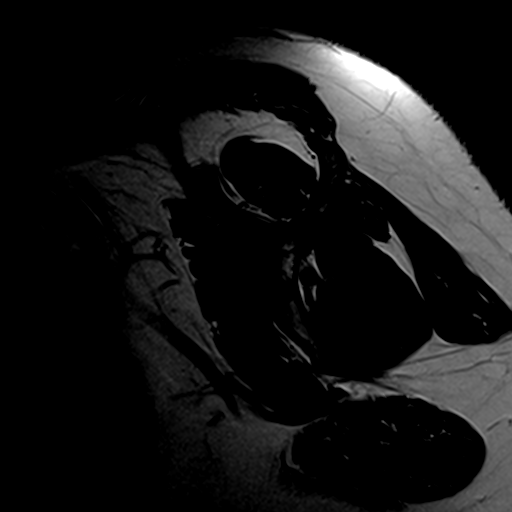
[im 5/18]
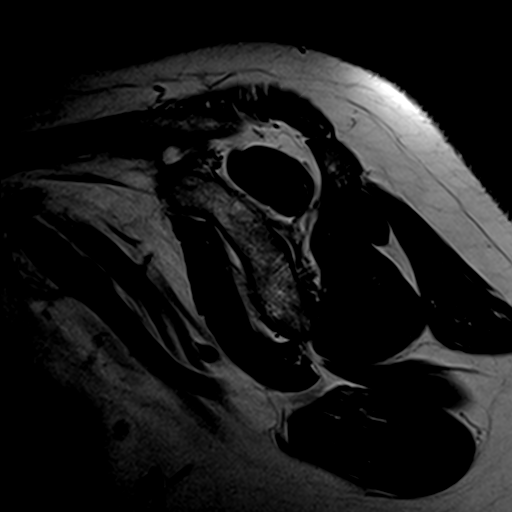

[Series 7: PD · oblique · 4.0mm · 0.55mm/px · 8 of 17 slices shown]
[im 1/17]
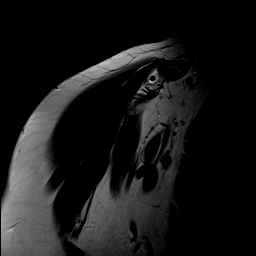
[im 3/17]
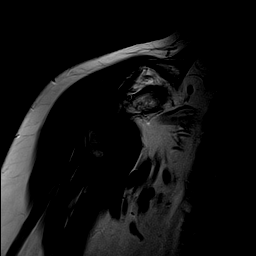
[im 5/17]
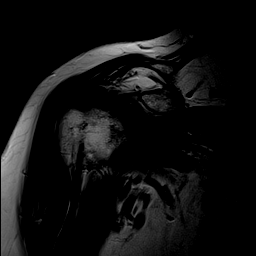
[im 7/17]
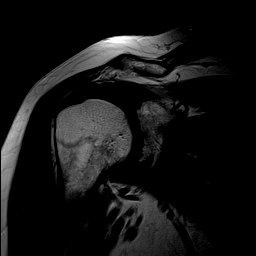
[im 10/17]
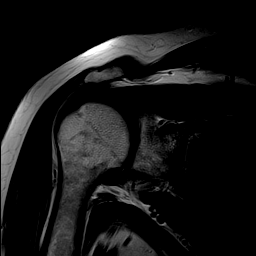
[im 12/17]
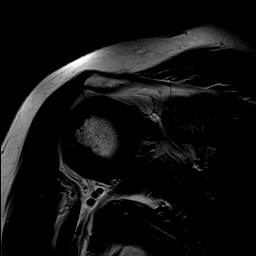
[im 14/17]
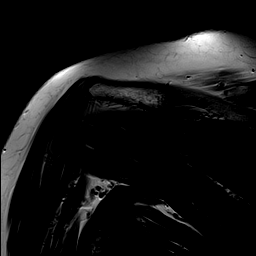
[im 17/17]
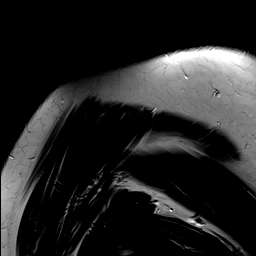

[35 of 40 positions shown; findings below may reference images not displayed]

FINDINGS: Rotator cuff:  Mild infraspinatus tendinopathy.

Muscles: There is a band of edema within the deep portion of the
infraspinatus muscle, images 4-9 series 4.

Biceps long head:  Unremarkable

Acromioclavicular Joint: No significant arthropathy. Subacromial
morphology is type 2 (curved).

Glenohumeral Joint: Moderate degenerative chondral thinning in the
glenohumeral joint, with confluent small degenerative subcortical
cystic lesions anteriorly along the humeral head as on image [DATE].
No joint effusion.

Labrum: Focal linear increased signal in the posterior superior
labrum on images 9-10 series 5, questionable extension to the
articular surface on image 10, probably a small nondisplaced
superior labral tear.

Bones: Red marrow redistribution is present. This is nonspecific can
be associated with a variety of conditions including chronic anemia,
chronic heart failure, myelofibrosis, infiltrative marrow disease,
response to infection, and other conditions.

Other: No supplemental non-categorized findings.
IMPRESSION: 1. Band of longitudinal edema in the infraspinatus muscle along with
mild distal infraspinatus tendinopathy. This edema could reflect an
infraspinatus muscle strain.
2. Moderate degenerative chondral thinning in the glenohumeral joint
with some confluent small degenerative subcortical cystic lesions
anteriorly along the articular surface of the humeral head.
3. Suspected small nondisplaced tear of the superior labrum,
although findings are subtle in this case.
4. No joint effusion or fluid in the subacromial subdeltoid bursa.

## 2018-05-17 MED ORDER — TRAZODONE HCL 100 MG PO TABS: 200.0000 mg | ORAL_TABLET | Freq: Every day | ORAL | 1 refills | Status: DC

## 2018-05-17 MED ORDER — VENLAFAXINE HCL ER 75 MG PO CP24: 225.0000 mg | ORAL_CAPSULE | Freq: Every day | ORAL | 0 refills | Status: DC

## 2018-05-17 MED ORDER — LAMOTRIGINE 200 MG PO TABS: ORAL_TABLET | ORAL | 0 refills | Status: DC

## 2018-05-17 NOTE — Progress Notes (Signed)
Anita Arroyo [id number] [birth year] 58 y.o.  Virtual Visit via Telephone Note  I connected with Anita Arroyo on 05/17/18 at  1:00 PM EDT by telephone and verified that I am speaking with the correct person using two identifiers.   I discussed the limitations, risks, security and privacy concerns of performing an evaluation and management service by telephone and the availability of in person appointments. I also discussed with the patient that there may be a patient responsible charge related to this service. The patient expressed understanding and agreed to proceed.   I discussed the assessment and treatment plan with the patient. The patient was provided an opportunity to ask questions and all were answered. The patient agreed with the plan and demonstrated an understanding of the instructions.   The patient was advised to call back or seek an in-person evaluation if the symptoms worsen or if the condition fails to improve as anticipated.  I provided 30 minutes of non-face-to-face time during this encounter. Pt located at home. Provider located at home.    Thayer Headings, PMHNP    Subjective:   Patient ID:  Anita Arroyo is a 58 y.o. (DOB [birth year]) female.  Chief Complaint:  Chief Complaint  Patient presents with  . Depression    HPI Anita Arroyo presents to the office today for follow-up of mood and anxiety. She reports that she noticed an improvement about 2 weeks after starting [place]. She reports that she fractured 3 of her fingers and had surgery this past Friday. She reports that she fell when trying to catch a dog.   She reports that her mood has remained stable despite circumstances. Denies depressed mood. She reports that she notices she does "not have a lot of patience" particulary when having increased pain. Denies anxiety. She reports adequate sleep and estimates sleeping 7-10 hours a night. She reports that appetite has been decreased. She reports that her concentration has  been adequate.   Denies SI.   Reports that orthopedic surgeon recommended she start Vitamin D and Calcium. She reports that she has ordered this and it should arrive soon.   Reports that she has been having significant financial stress.   Review of Systems:  Review of Systems  Musculoskeletal: Negative for gait problem.       Recent finger fx and pain in fingers  Neurological: Negative for tremors.  Psychiatric/Behavioral:       Please refer to HPI    Medications: I have reviewed the patient's current medications.  Current Outpatient Medications  Medication Sig Dispense Refill  . albuterol (PROVENTIL HFA;VENTOLIN HFA) 108 (90 Base) MCG/ACT inhaler Inhale 2 puffs every 4 (four) hours as needed into the lungs for wheezing or shortness of breath (cough, shortness of breath or wheezing.). 3 Inhaler 0  . atenolol (TENORMIN) 50 MG tablet Take 50 mg in the morning and 25 mg in the evening 135 tablet 1  . Biotin (BIOTIN 5000) 5 MG CAPS Take 5 mg by mouth daily.    . budesonide-formoterol (SYMBICORT) 80-4.5 MCG/ACT inhaler Inhale 2 puffs into the lungs 2 (two) times daily. 1 Inhaler 11  . cetirizine (ZYRTEC) 10 MG tablet Take 10 mg by mouth daily.     . diclofenac sodium (VOLTAREN) 1 % GEL     . HYDROcodone-acetaminophen (NORCO/VICODIN) 5-325 MG tablet take 1 TABLET BY MOUTH EVERY 6 HOURS AS NEEDED for moderate pain 120 tablet 0  . lamoTRIgine (LAMICTAL) 200 MG tablet Take 1/2 tab po q am  and 2 tabs po QHS 315 tablet 0  . Levomefolate Glucosamine (METHYLFOLATE PO) Take by mouth.    . Magnesium 250 MG TABS Take 250 mg by mouth daily.    . meclizine (ANTIVERT) 25 MG tablet Take 1 tablet (25 mg total) by mouth as needed for dizziness. 30 tablet 1  . montelukast (SINGULAIR) 10 MG tablet Take 1 tablet (10 mg total) at bedtime by mouth. 90 tablet 3  . pantoprazole (PROTONIX) 40 MG tablet Take 1 tablet (40 mg total) by mouth daily. 90 tablet 1  . traZODone (DESYREL) 100 MG tablet Take 2 tablets  (200 mg total) by mouth at bedtime. 180 tablet 1  . triamcinolone cream (KENALOG) 0.1 % Apply 1 application topically 2 (two) times daily. 30 g 0  . triamterene-hydrochlorothiazide (MAXZIDE-25) 37.5-25 MG tablet Take 0.5 tablets daily by mouth. 90 tablet 3  . venlafaxine XR (EFFEXOR XR) 75 MG 24 hr capsule Take 3 capsules (225 mg total) by mouth daily with breakfast. 270 capsule 0  . Alcohol Swabs (B-D SINGLE USE SWABS REGULAR) PADS     . blood glucose meter kit and supplies KIT Dispense based on patient and insurance preference. Use daily as directed. (FOR ICD-9 250.00, 250.01). 1 each 0  . ezetimibe (ZETIA) 10 MG tablet Take 1 tablet (10 mg total) by mouth daily. 30 tablet 5  . fluticasone (FLONASE) 50 MCG/ACT nasal spray USE 1 SPRAY IN EACH NOSTRIL TWICE DAILY 48 g 1  . TRUE METRIX BLOOD GLUCOSE TEST test strip     . TRUEPLUS LANCETS 33G MISC      No current facility-administered medications for this visit.     Medication Side Effects: None  Allergies:  Allergies  Allergen Reactions  . Contrast Media [Iodinated Diagnostic Agents] Anaphylaxis    Anaphylactic shock.  . Iodine Shortness Of Breath  . Adhesive [Tape] Other (See Comments)    Reaction:  Blisters   . Celecoxib     "I climb the walls"  . Statins     Body aches, constipation AND DEPRESSION  . Ceclor [Cefaclor] Rash  . Moxifloxacin Rash  . Sulfonamide Derivatives Nausea And Vomiting    Past Medical History:  Diagnosis Date  . Arthritis   . Asthma   . Bipolar 1 disorder ([place])   . Bipolar 1 disorder ([place])   . Breast cancer ([place])   . Depression   . Dysrhythmia    palpitations  . Dysrhythmia    atrial fibrillation  . Fibromyalgia   . Finger fracture 2011  . GERD (gastroesophageal reflux disease)   . Heart murmur   . History of stress test 04/28/2011   showed normal perfusion without scar or ischemia  . Hx of echocardiogram 04/28/2011   showed normal systolic function with mild diastolic dysfunction,she had  trace MR but did not have frank mitral valve prolapse demonstrated. She had mild pulmonary hypertension with an estimated RV systolic pressure at 34 mm.  . Hyperlipemia   . Meniere disease   . MVP (mitral valve prolapse)   . Neuromuscular disorder ([place])    fibromyalgia    Family History  Problem Relation Age of Onset  . Mitral valve prolapse Mother   . Heart Problems Mother   . Cancer Mother     Social History   Socioeconomic History  . Marital status: Single    Spouse name: Not on file  . Number of children: Not on file  . Years of education: Not on file  . Highest  education level: Not on file  Occupational History  . Not on file  Social Needs  . Financial resource strain: Not on file  . Food insecurity:    Worry: Not on file    Inability: Not on file  . Transportation needs:    Medical: Not on file    Non-medical: Not on file  Tobacco Use  . Smoking status: Current Every Day Smoker    Packs/day: 1.50    Years: 40.00    Pack years: 60.00    Types: Cigarettes  . Smokeless tobacco: Never Used  . Tobacco comment: Cannot afford chantix. Working on getting discount from drug compny  Substance and Sexual Activity  . Alcohol use: Yes    Alcohol/week: 0.0 standard drinks    Comment: occ  . Drug use: No  . Sexual activity: Not on file  Lifestyle  . Physical activity:    Days per week: Not on file    Minutes per session: Not on file  . Stress: Not on file  Relationships  . Social connections:    Talks on phone: Not on file    Gets together: Not on file    Attends religious service: Not on file    Active member of club or organization: Not on file    Attends meetings of clubs or organizations: Not on file    Relationship status: Not on file  . Intimate partner violence:    Fear of current or ex partner: Not on file    Emotionally abused: Not on file    Physically abused: Not on file    Forced sexual activity: Not on file  Other Topics Concern  . Not on file   Social History Narrative  . Not on file    Past Medical History, Surgical history, Social history, and Family history were reviewed and updated as appropriate.   Please see review of systems for further details on the patient's review from today.   Objective:   Physical Exam:  LMP 06/25/2011   Physical Exam Neurological:     Mental Status: She is alert and oriented to person, place, and time.     Cranial Nerves: No dysarthria.  Psychiatric:        Attention and Perception: Attention normal.        Mood and Affect: Mood normal.        Speech: Speech normal.        Behavior: Behavior is cooperative.        Thought Content: Thought content normal. Thought content is not paranoid or delusional. Thought content does not include homicidal or suicidal ideation. Thought content does not include homicidal or suicidal plan.        Cognition and Memory: Cognition and memory normal.        Judgment: Judgment normal.     Lab Review:     Component Value Date/Time   NA 140 11/25/2017 1155   NA 138 08/20/2014 1117   K 4.3 11/25/2017 1155   K 4.7 08/20/2014 1117   CL 98 11/25/2017 1155   CL 104 06/29/2012 1221   CO2 27 11/25/2017 1155   CO2 26 08/20/2014 1117   GLUCOSE 84 11/25/2017 1155   GLUCOSE 82 03/13/2016 1720   GLUCOSE 92 08/20/2014 1117   GLUCOSE 107 (H) 06/29/2012 1221   BUN 17 11/25/2017 1155   BUN 16.0 08/20/2014 1117   CREATININE 0.93 11/25/2017 1155   CREATININE 0.9 08/20/2014 1117   CALCIUM 9.8 11/25/2017 1155  CALCIUM 9.4 08/20/2014 1117   PROT 6.7 11/25/2017 1155   PROT 6.6 08/20/2014 1117   ALBUMIN 4.7 11/25/2017 1155   ALBUMIN 3.9 08/20/2014 1117   AST 21 11/25/2017 1155   AST 22 08/20/2014 1117   ALT 21 11/25/2017 1155   ALT 18 08/20/2014 1117   ALKPHOS 109 11/25/2017 1155   ALKPHOS 54 08/20/2014 1117   BILITOT <0.2 11/25/2017 1155   BILITOT 0.47 08/20/2014 1117   GFRNONAA 68 11/25/2017 1155   GFRAA 78 11/25/2017 1155       Component Value  Date/Time   WBC 6.4 08/12/2016 1659   WBC 10.6 (H) 03/13/2016 1720   RBC 4.53 08/12/2016 1659   RBC 4.87 03/13/2016 1720   HGB 14.2 08/12/2016 1659   HGB 13.7 08/20/2014 1117   HCT 41.9 08/12/2016 1659   HCT 40.3 08/20/2014 1117   PLT 230 08/12/2016 1659   MCV 93 08/12/2016 1659   MCV 94.1 08/20/2014 1117   MCH 31.3 08/12/2016 1659   MCH 30.6 03/13/2016 1720   MCHC 33.9 08/12/2016 1659   MCHC 34.3 03/13/2016 1720   RDW 14.1 08/12/2016 1659   RDW 13.2 08/20/2014 1117   LYMPHSABS 2.4 03/13/2016 1720   LYMPHSABS 2.0 08/20/2014 1117   MONOABS 0.9 03/13/2016 1720   MONOABS 0.5 08/20/2014 1117   EOSABS 0.0 03/13/2016 1720   EOSABS 0.1 08/20/2014 1117   BASOSABS 0.0 03/13/2016 1720   BASOSABS 0.1 08/20/2014 1117    No results found for: POCLITH, LITHIUM   No results found for: PHENYTOIN, PHENOBARB, VALPROATE, CBMZ   .res Assessment: Plan:   Continue Effexor XR 225 mg po qd for depression and anxiety Continue Lamictal 100 mg po q am and 400 mg po qhs for mood stabilization. Continue l-methyl-folate 15 mg po qd for augmentation of depression. Continue Trazodone 200 mg po QHS for insomnia.    Bipolar 1 disorder, mixed, moderate (HCC) - Plan: venlafaxine XR (EFFEXOR XR) 75 MG 24 hr capsule, lamoTRIgine (LAMICTAL) 200 MG tablet  Primary insomnia - Plan: traZODone (DESYREL) 100 MG tablet  Please see After Visit Summary for patient specific instructions.  No future appointments.  No orders of the defined types were placed in this encounter.     -------------------------------

## 2018-08-17 ENCOUNTER — Other Ambulatory Visit: Payer: Self-pay | Admitting: Psychiatry

## 2018-08-17 DIAGNOSIS — F3162 Bipolar disorder, current episode mixed, moderate: Secondary | ICD-10-CM

## 2018-09-07 ENCOUNTER — Other Ambulatory Visit: Payer: Self-pay | Admitting: Psychiatry

## 2018-09-07 DIAGNOSIS — F3162 Bipolar disorder, current episode mixed, moderate: Secondary | ICD-10-CM

## 2018-10-17 ENCOUNTER — Telehealth: Payer: Self-pay | Admitting: Acute Care

## 2018-10-18 NOTE — Telephone Encounter (Signed)
I have spoken with Anita Arroyo and she is ok with her [facility] CT appt on 11/16/2018 @ 2:30pm Nothing further needed

## 2018-11-16 ENCOUNTER — Inpatient Hospital Stay: Admission: RE | Admit: 2018-11-16 | Payer: Medicare HMO | Source: Ambulatory Visit

## 2018-11-23 ENCOUNTER — Other Ambulatory Visit: Payer: Self-pay | Admitting: Psychiatry

## 2018-11-23 DIAGNOSIS — F3162 Bipolar disorder, current episode mixed, moderate: Secondary | ICD-10-CM

## 2018-11-30 DIAGNOSIS — Z79891 Long term (current) use of opiate analgesic: Secondary | ICD-10-CM | POA: Insufficient documentation

## 2018-12-08 ENCOUNTER — Other Ambulatory Visit: Payer: Self-pay | Admitting: Internal Medicine

## 2018-12-08 ENCOUNTER — Other Ambulatory Visit: Payer: Self-pay | Admitting: Psychiatry

## 2018-12-08 ENCOUNTER — Telehealth: Payer: Self-pay | Admitting: Internal Medicine

## 2018-12-08 DIAGNOSIS — F5101 Primary insomnia: Secondary | ICD-10-CM

## 2018-12-08 NOTE — Telephone Encounter (Signed)
Called and spoke with Patient. Patient requested refill of Symbicort to [facility].  Patient last OV 10/2017.  Patient scheduled OV with Dr. Melvyn Novas 01/01/19 at 2:30pm.  Refill sent to requested pharmacy.  Nothing further at this time.

## 2018-12-18 ENCOUNTER — Inpatient Hospital Stay: Admission: RE | Admit: 2018-12-18 | Payer: Medicare HMO | Source: Ambulatory Visit

## 2018-12-29 ENCOUNTER — Telehealth: Payer: Self-pay | Admitting: Internal Medicine

## 2018-12-29 ENCOUNTER — Ambulatory Visit (INDEPENDENT_AMBULATORY_CARE_PROVIDER_SITE_OTHER)
Admission: RE | Admit: 2018-12-29 | Discharge: 2018-12-29 | Disposition: A | Discharge status: Home / Self Care | Payer: Medicare HMO | Source: Ambulatory Visit | Attending: Acute Care | Admitting: Acute Care

## 2018-12-29 ENCOUNTER — Other Ambulatory Visit: Payer: Self-pay

## 2018-12-29 DIAGNOSIS — F17200 Nicotine dependence, unspecified, uncomplicated: Secondary | ICD-10-CM

## 2018-12-29 DIAGNOSIS — Z122 Encounter for screening for malignant neoplasm of respiratory organs: Secondary | ICD-10-CM | POA: Diagnosis not present

## 2018-12-29 DIAGNOSIS — F17210 Nicotine dependence, cigarettes, uncomplicated: Secondary | ICD-10-CM

## 2018-12-29 NOTE — Telephone Encounter (Signed)
Called and spoke with pt asking if she knew who had tried calling her and what for and pt said she did not know. I did see that pt is scheduled for a televisit with MW Monday 12/7 and a CT today at 3pm. Verified those appts with pt.nothing further needed.

## 2018-12-29 NOTE — Telephone Encounter (Signed)
PT returned call.  Pt is frustrated that when calling her, whomever called didn't leave a name or a reason for the call.

## 2018-12-29 NOTE — Telephone Encounter (Signed)
LMTCB

## 2019-01-01 ENCOUNTER — Telehealth: Payer: Self-pay | Admitting: Internal Medicine

## 2019-01-01 ENCOUNTER — Ambulatory Visit (INDEPENDENT_AMBULATORY_CARE_PROVIDER_SITE_OTHER): Payer: Medicare HMO | Admitting: Internal Medicine

## 2019-01-01 DIAGNOSIS — F17210 Nicotine dependence, cigarettes, uncomplicated: Secondary | ICD-10-CM

## 2019-01-01 DIAGNOSIS — R0600 Dyspnea, unspecified: Secondary | ICD-10-CM | POA: Diagnosis not present

## 2019-01-01 DIAGNOSIS — R0609 Other forms of dyspnea: Secondary | ICD-10-CM

## 2019-01-01 NOTE — Patient Instructions (Addendum)
It is my opinion that you are not physically able to participate in jury duty at this time and it is a hazard to your health due to potential for COVID-19 exposure  The key is to stop smoking completely before smoking completely stops you!   Continue symbicort 80 as you are now    Only use your albuterol as a rescue medication to be used if you can't catch your breath by resting or doing a relaxed purse lip breathing pattern.  - The less you use it, the better it will work when you need it. - Ok to use up to 2 puffs  every 4 hours if you must but call for immediate appointment if use goes up over your usual need - Don't leave home without it !!  (think of it like the spare tire for your car)   Call us back after the first of the year for follow up and a new strength for symbicort when you cost will be less   Please schedule a follow up visit in 6 months but call sooner if needed  - ask re kidney cyst on follow

## 2019-01-01 NOTE — Progress Notes (Signed)
   Subjective:    Patient ID: Anita Arroyo, female    DOB: 1960-06-15     MRN: [id number]   Brief patient profile:  59  yowf active smoker healthy childhood in 52s started noticing sniffles/ drainage  Spring/ fall > eval by [facility] ? Grass/ pollen/ Cats ? Better on shots  around 2017 intermittent cough not related rhinitis flared with uri's  rx advair/ singulair/ albuterol some better but with progressive worse doe  X walking dogs so referred to pulmonary clinic 11/03/2017 by Dr   Coletta Memos for ? Copd but spirometry nl at initial ov    History of Present Illness  11/03/2017   Pulmonary / 1st eval  Chief Complaint  Patient presents with  . Consult    Referred by Dr. Coletta Memos for COPD.    Dyspnea:  MMRC3 = can't walk 100 yards even at a slow pace at a flat grade s stopping due to sob   Cough: am's worst x tbsp or two thick all different colors Sleeping: bed flat x 1pillow  SABA use: typical day once every few weeks  02: no  rec Plan A = Automatic = Symbicort 80 Take 2 puffs first thing in am and then another 2 puffs about 12 hours later.  Work on inhaler technique:  Plan B = Backup Only use your albuterol as a rescue medication  The key is to stop smoking completely before smoking completely stops you!    Virtual Visit via Telephone Note 01/01/2019   I connected with Anita Arroyo on 01/01/19 at  2:30 PM EST by telephone and verified that I am speaking with the correct person using two identifiers.   I discussed the limitations, risks, security and privacy concerns of performing an evaluation and management service by telephone and the availability of in person appointments. I also discussed with the patient that there may be a patient responsible charge related to this service. The patient expressed understanding and agreed to proceed.   History of Present Illness: still smoking with nl pfts = copd 0/ AB  Dyspnea:  Walks dogs doe esp [place] sob   Cough: worse in am/ white mucus / some nasal  congestion Sleeping: flat bed / one pillow      No obvious day to day or daytime variability or assoc excess/ purulent sputum or mucus plugs or hemoptysis or cp or chest tightness, subjective wheeze or overt sinus or hb symptoms.    Also denies any obvious fluctuation of symptoms with weather or environmental changes or other aggravating or alleviating factors except as outlined above.   Meds reviewed/ med reconciliation completed       Observations/Objective: Sounds fine on the phone    Assessment and Plan: See problem list for active a/p's   Follow Up Instructions: See avs for instructions unique to this ov which includes revised/ updated med list     I discussed the assessment and treatment plan with the patient. The patient was provided an opportunity to ask questions and all were answered. The patient agreed with the plan and demonstrated an understanding of the instructions.   The patient was advised to call back or seek an in-person evaluation if the symptoms worsen or if the condition fails to improve as anticipated.  I provided 22 minutes of non-face-to-face time during this encounter.   Christinia Gully, MD

## 2019-01-01 NOTE — Telephone Encounter (Signed)
Spoke with the pt  She states needs jury excuse that MW told her he would put in my chart  I advised that he did put something in her AVS about it, but if she needs to have the actual jury excusal letter done and mailed to court it has to be done 10 days ahead of her jury date  She states that she is supposed to go 2 days from now so this won't work  She states that the AVS that he typed should work  Nothing further needed

## 2019-01-02 ENCOUNTER — Ambulatory Visit: Payer: Medicare HMO | Admitting: Cardiovascular Disease

## 2019-01-02 ENCOUNTER — Encounter: Payer: Self-pay | Admitting: Internal Medicine

## 2019-01-02 DIAGNOSIS — I700 Atherosclerosis of aorta: Secondary | ICD-10-CM | POA: Insufficient documentation

## 2019-01-02 NOTE — Assessment & Plan Note (Signed)
11/03/2017  Walked RA x 3 laps @ 185 ft each stopped due to  End of study/ moderate pace with limp, sats 91% at end  - Spirometry 11/03/2017  FEV1 2.1 (86%)  Ratio 80 nl curvature p advair 250 in am  - 11/03/2017  After extensive coaching inhaler device,  effectiveness =    75% > try symb 80 2bid   - PFTs 02/13/2018 wnl    Still doe suggesting AB component and could consider higher strength of symbicort when new insurance kicks in / in meantime needs to understand how/ when to use saba - see avs for instructions unique to this ov

## 2019-01-02 NOTE — Assessment & Plan Note (Signed)
Counseled re importance of smoking cessation but did not meet time criteria for separate billing    Each maintenance medication was reviewed in detail including most importantly the difference between maintenance and as needed and under what circumstances the prns are to be used.  Please see AVS for specific  Instructions which are unique to this visit and I personally typed out  which were reviewed in detail over the phone with the patient and a copy provided via MyChart   Pt informed of the seriousness of COVID 19 infection as a direct risk to their health  and safey and to those of their loved ones and should continue to wear facemask in public and minimize exposure to public locations but especially avoid any area or activity where non-close contacts are not observing distancing or wearing an appropriate face mask.    >>> f/u in 6 m   - call sooner if needed

## 2019-01-03 ENCOUNTER — Other Ambulatory Visit: Payer: Self-pay | Admitting: *Deleted

## 2019-01-03 DIAGNOSIS — Z122 Encounter for screening for malignant neoplasm of respiratory organs: Secondary | ICD-10-CM

## 2019-01-03 DIAGNOSIS — F17210 Nicotine dependence, cigarettes, uncomplicated: Secondary | ICD-10-CM

## 2019-01-03 DIAGNOSIS — Z87891 Personal history of nicotine dependence: Secondary | ICD-10-CM

## 2019-01-05 ENCOUNTER — Other Ambulatory Visit: Payer: Self-pay | Admitting: Obstetrics and Gynecology

## 2019-01-05 ENCOUNTER — Other Ambulatory Visit: Payer: Self-pay | Admitting: Physician Assistant

## 2019-01-05 ENCOUNTER — Other Ambulatory Visit: Payer: Self-pay | Admitting: Internal Medicine

## 2019-01-05 DIAGNOSIS — E559 Vitamin D deficiency, unspecified: Secondary | ICD-10-CM

## 2019-01-05 MED ORDER — BUDESONIDE-FORMOTEROL FUMARATE 80-4.5 MCG/ACT IN AERO: 2.0000 | INHALATION_SPRAY | Freq: Two times a day (BID) | RESPIRATORY_TRACT | 11 refills | Status: DC

## 2019-01-10 ENCOUNTER — Other Ambulatory Visit: Payer: Self-pay

## 2019-01-10 ENCOUNTER — Encounter: Payer: Self-pay | Admitting: Cardiovascular Disease

## 2019-01-10 ENCOUNTER — Ambulatory Visit (INDEPENDENT_AMBULATORY_CARE_PROVIDER_SITE_OTHER): Payer: Medicare HMO | Admitting: Cardiovascular Disease

## 2019-01-10 DIAGNOSIS — E785 Hyperlipidemia, unspecified: Secondary | ICD-10-CM | POA: Diagnosis not present

## 2019-01-10 DIAGNOSIS — I5189 Other ill-defined heart diseases: Secondary | ICD-10-CM

## 2019-01-10 DIAGNOSIS — I700 Atherosclerosis of aorta: Secondary | ICD-10-CM | POA: Diagnosis not present

## 2019-01-10 DIAGNOSIS — R002 Palpitations: Secondary | ICD-10-CM

## 2019-01-10 DIAGNOSIS — J432 Centrilobular emphysema: Secondary | ICD-10-CM

## 2019-01-10 DIAGNOSIS — I2510 Atherosclerotic heart disease of native coronary artery without angina pectoris: Secondary | ICD-10-CM

## 2019-01-10 DIAGNOSIS — I519 Heart disease, unspecified: Secondary | ICD-10-CM | POA: Diagnosis not present

## 2019-01-10 DIAGNOSIS — Z720 Tobacco use: Secondary | ICD-10-CM | POA: Diagnosis not present

## 2019-01-10 NOTE — Progress Notes (Signed)
Patient ID: Anita Arroyo, female   DOB: [birth year], 59 y.o.   MRN: [id number]     HPI: Anita Arroyo is a 58 y.o. female who presents for an 55 month cardiology evaluation.  I initially saw [name] in March 2013 for evaluation of episodes of chest tightness with walking. She has a long-standing history of ongoing tobacco use, and reportedly it had a history of possible mitral valve prolapse. An echo Doppler study in April 2013 showed normal systolic function with grade 1 diastolic dysfunction. She had trace mitral regurgitation and frank mitral valve prolapse was not demonstrated. She did have mild pulmonary hypertension with estimated systolic pressure 34 mm. A nuclear perfusion study revealed normal perfusion.  She as ahistory of hyperlipidemia which was treated with Zetia. She has undergone left shoulder surgery by Dr. Percell Miller and had a ruptured by sepsis tendon. 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 Percell Miller, and history of bipolar polar disorder followed by Dr. Lovena Le as well as fibromyalgia.  Last year she lost approximately 21 pounds.  She has seen a nutritionist.  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  Unfortunately, she continues to smoke approximately 1-1/2ppd. She never had the prescription for Chantix filled.  She does have Mnire's disease for which she takes Dyazide.  She is on trazodone 200 mg at bedtime and also takes Lamictal 150 mg twice a day in addition to Effexor XR for her bipolar history.  She continues to take tamoxifen with her history of breast CA.  She has a history of palpitations and had been taking atenolol 50 mg in the morning and 25 mg at night. Due to increasing palpitations atenolol was increased to  50 mg twice a day with improvement.  He  denies any presyncope or syncope.    In 2016 a comprehensive metabolic panel, CBC, thyroid function studies were all entirely normal.  Lipid studies were excellent with a total cholesterol 144, triglycerides 80, HDL 49, LDL 79.  She will be establishing with a new primary care physician.  She was seen in August 2017 for preoperative clearance prior to undergoing shoulder surgery which was done by Dr. Percell Miller.  She tolerated her surgery well without cardiovascular compromise.  In February 2018, she was bitten by a cat in her leg and developed infection and cellulitis requiring antibiotic therapy.  In March 2018, she underwent a screening chest CT in light of her long-standing tobacco history.  This suggested atherosclerosis involving her left circumflex coronary artery and atherosclerosis in a nonaneurysmal thoracic aorta.  She continues to smoke cigarettes and has been smoking 1-1-1/2 packs per day, having started at age 54.  When I last saw her in May 2018, she had stopped taking her Lipitor and previously also been on Zetia, which she stopped secondary to cost.  With her atherosclerosis noted on CT imaging.  I recommended aggressive lipid management with target LDL less than 70.  She had not had recent lab work and as result, this was scheduled.  Her subsequent lipid panel revealed significant increasing cholesterol at 250, triglycerides 134, HDL 61, LDL 162.  We called in a prescription for Crestor 20 mg daily.  Her blood sugar was also mildly increased.  She apparently only took the Crestor for short duration  and then stopped taking this since she had felt weak and was concerned about possible myalgias.  She underwent an echo Doppler study which showed normal systolic function and grade 1 diastolic dysfunction.  An exercise nuclear study was performed in July 2018 which showed normal perfusion and function.  She denies recent chest pain.  She admits to being tired.  Her mother recently had abdominal hernia  surgery.  [name] denies any chest pain or palpitations.    She could not tolerate rosuvastatin and believes this because severe depression along with fatigue and myalgias.  In addition she could not tolerate atorvastatin.  She believes she may have tried Zetia but is uncertain about this and cannot remember why she stopped taking it.  She was seen by Joslyn Hy for consideration of PCSK9 inhibition and potential future use of been podalic acid once approved in early 2020.  She has continued to see Dr. Leonides Schanz for pulmonary issues.  Although she was referred to him for possible COPD, this was not detected on spirometry and she is not felt to have this.  She continues to undergo annual low-dose CT of her chest for lung cancer screening.  Her most recent evaluation was November 07, 2017.  She was found to have normal heart size.  There is suggestion of left circumflex coronary atherosclerosis as well as atherosclerotic nonaneurysmal thoracic aorta.  There is a new tiny anterior apical right upper lobe pulmonary nodule measuring 2.7 mm in volume which is felt to be benign in appearance.  When I last saw her in January 2020 she denied any chest pain or palpitations.   Her last laboratory in November 2019 showed a BUN of 17 creatinine 0.93.  Lipid studies revealed elevated cholesterol 235, HDL 70, LDL 142, triglycerides 113.  Hemoglobin A1c was 5.6.  She was evaluated in the emergency room in mid December several days after falling when she tripped over her cat.  She is felt to have possible plateau fracture of her right knee.  She has seen Dr. Latanya Maudlin.  She was walking with crutches.  She was still smoking  cigarettes 1-1/2 and had a prescription for Chantix/  Over the past year, she has not been successful with smoking cessation.  She denies any recent palpitations on her current atenolol 50 mg in the morning and 25 mg in the evening.  It has been a difficult year with the COVID-19 pandemic and as result she  has not been able to do much work in caring for family animals since people are not traveling.  On December 29, 2018 she underwent a follow-up chest CT screen which again showed emphysema and aortic atherosclerosis.  There was left circumflex coronary artery calcification.  She had previously noted benign-appearing lung nodules.  She had undergone recent laboratory by her primary physician in November 2020.  Lipid studies were further elevated with total cholesterol 262, LDL cholesterol now 176, HDL 60, and triglycerides 144.  She would like to [place] if she is able to qualify for assistance since she cannot afford this.  She continues to be on Symbicort 80/4.5 but this will be increased to 160/4.5 by her pulmonologist.  She is also on Singulair.  She stopped taking Zetia since she felt this exacerbated her depression.  She has a history of bipolar disorder and is on Lamictal, Effexor SR and trazodone.  She presents for evaluation.  Past Medical History:  Diagnosis Date  . Arthritis   . Asthma   .  Bipolar 1 disorder ([place])   . Bipolar 1 disorder ([place])   . Breast cancer ([place])   . Depression   . Dysrhythmia    palpitations  . Dysrhythmia    atrial fibrillation  . Fibromyalgia   . Finger fracture 2011  . GERD (gastroesophageal reflux disease)   . Heart murmur   . History of stress test 04/28/2011   showed normal perfusion without scar or ischemia  . Hx of echocardiogram 04/28/2011   showed normal systolic function with mild diastolic dysfunction,she had trace MR but did not have frank mitral valve prolapse demonstrated. She had mild pulmonary hypertension with an estimated RV systolic pressure at 34 mm.  . Hyperlipemia   . Meniere disease   . MVP (mitral valve prolapse)   . Neuromuscular disorder ([place])    fibromyalgia    Past Surgical History:  Procedure Laterality Date  . ANKLE SURGERY     left x4  . BREAST SURGERY  2011   lumpectomy right breast   . COLONOSCOPY WITH  PROPOFOL N/A 02/25/2014   Procedure: COLONOSCOPY WITH PROPOFOL;  Surgeon: Garlan Fair, MD;  Location: [facility];  Service: Endoscopy;  Laterality: N/A;  . deviated septum    . ECTOPIC PREGNANCY SURGERY    . ELBOW SURGERY     right elbow  . EYE SURGERY     cataract surgery  . fibroid     2-3 fibroid adenomas removed  . JOINT REPLACEMENT  2011   right knee   . KNEE SURGERY     left knee  . SHOULDER ARTHROSCOPY WITH BICEPSTENOTOMY Right 09/25/2015   Procedure: SHOULDER ARTHROSCOPY WITH BICEPSTENOTOMY;  Surgeon: Ninetta Lights, MD;  Location: [facility];  Service: Orthopedics;  Laterality: Right;  . SHOULDER ARTHROSCOPY WITH DISTAL CLAVICLE RESECTION Right 09/25/2015   Procedure: SHOULDER ARTHROSCOPY WITH DISTAL CLAVICLE RESECTION;  Surgeon: Ninetta Lights, MD;  Location: [facility];  Service: Orthopedics;  Laterality: Right;  . SHOULDER ARTHROSCOPY WITH SUBACROMIAL DECOMPRESSION Right 09/25/2015   Procedure: RIGHT SHOULDER ARTHROSCOPY DEBRIDEMENT,ACROMIOPLASTY,DISTAL CLAVICAL EXCISION, RELEASE OF BICEPS TENDON;  Surgeon: Ninetta Lights, MD;  Location: [facility];  Service: Orthopedics;  Laterality: Right;  . TMJ ARTHROPLASTY    . TONSILLECTOMY    . TOTAL SHOULDER ARTHROPLASTY Left 01/05/2016  . UTERINE FIBROID SURGERY     polups and fibroids removed   . WRIST SURGERY     left    Allergies  Allergen Reactions  . Contrast Media [Iodinated Diagnostic Agents] Anaphylaxis    Anaphylactic shock.  . Iodine Shortness Of Breath  . Adhesive [Tape] Other (See Comments)    Reaction:  Blisters   . Celecoxib     "I climb the walls"  . Statins     Body aches, constipation AND DEPRESSION  . Ceclor [Cefaclor] Rash  . Moxifloxacin Rash  . Sulfonamide Derivatives Nausea And Vomiting    Current Outpatient Medications  Medication Sig Dispense Refill  . albuterol (PROVENTIL HFA;VENTOLIN HFA) 108 (90 Base) MCG/ACT inhaler Inhale 2 puffs every 4  (four) hours as needed into the lungs for wheezing or shortness of breath (cough, shortness of breath or wheezing.). 3 Inhaler 0  . Alcohol Swabs (B-D SINGLE USE SWABS REGULAR) PADS     . atenolol (TENORMIN) 50 MG tablet Take 50 mg in the morning and 25 mg in the evening 135 tablet 1  . Biotin (BIOTIN 5000) 5 MG CAPS Take 5 mg by mouth daily.    Marland Kitchen  blood glucose meter kit and supplies KIT Dispense based on patient and insurance preference. Use daily as directed. (FOR ICD-9 250.00, 250.01). 1 each 0  . budesonide-formoterol (SYMBICORT) 80-4.5 MCG/ACT inhaler Inhale 2 puffs into the lungs 2 (two) times daily. 1 Inhaler 11  . cetirizine (ZYRTEC) 10 MG tablet Take 10 mg by mouth daily.     . diclofenac sodium (VOLTAREN) 1 % GEL     . fluticasone (FLONASE) 50 MCG/ACT nasal spray USE 1 SPRAY IN EACH NOSTRIL TWICE DAILY 48 g 1  . HYDROcodone-acetaminophen (NORCO/VICODIN) 5-325 MG tablet take 1 TABLET BY MOUTH EVERY 6 HOURS AS NEEDED for moderate pain 120 tablet 0  . L-Methylfolate 15 MG TABS Take 1 tablet by mouth once daily 30 tablet 5  . lamoTRIgine (LAMICTAL) 200 MG tablet TAKE 1/2 TABLET EVERY MORNING  AND TAKE 2 TABLETS AT BEDTIME 225 tablet 0  . Levomefolate Glucosamine (METHYLFOLATE PO) Take by mouth.    . Magnesium 250 MG TABS Take 250 mg by mouth daily.    . meclizine (ANTIVERT) 25 MG tablet Take 1 tablet (25 mg total) by mouth as needed for dizziness. 30 tablet 1  . montelukast (SINGULAIR) 10 MG tablet Take 1 tablet (10 mg total) at bedtime by mouth. 90 tablet 3  . pantoprazole (PROTONIX) 40 MG tablet Take 1 tablet (40 mg total) by mouth daily. 90 tablet 1  . traZODone (DESYREL) 100 MG tablet TAKE 2 TABLETS (200 MG TOTAL) BY MOUTH AT BEDTIME. 180 tablet 0  . triamcinolone cream (KENALOG) 0.1 % Apply 1 application topically 2 (two) times daily. 30 g 0  . triamterene-hydrochlorothiazide (MAXZIDE-25) 37.5-25 MG tablet Take 0.5 tablets daily by mouth. 90 tablet 3  . TRUE METRIX BLOOD GLUCOSE TEST  test strip     . TRUEPLUS LANCETS 33G MISC     . venlafaxine XR (EFFEXOR-XR) 75 MG 24 hr capsule TAKE 3 CAPSULES (225 MG TOTAL) BY MOUTH DAILY WITH BREAKFAST. 270 capsule 0  . Evolocumab (REPATHA SURECLICK) 510 MG/ML SOAJ Inject 140 mg into the skin every 14 (fourteen) days. 2 pen 6  . ezetimibe (ZETIA) 10 MG tablet Take 1 tablet (10 mg total) by mouth daily. (Patient not taking: Reported on 01/10/2019) 30 tablet 5   No current facility-administered medications for this visit.    Social History   Socioeconomic History  . Marital status: Single    Spouse name: Not on file  . Number of children: Not on file  . Years of education: Not on file  . Highest education level: Not on file  Occupational History  . Not on file  Tobacco Use  . Smoking status: Current Every Day Smoker    Packs/day: 1.50    Years: 40.00    Pack years: 60.00    Types: Cigarettes  . Smokeless tobacco: Never Used  . Tobacco comment: Cannot afford chantix. Working on getting discount from drug compny  Substance and Sexual Activity  . Alcohol use: Yes    Alcohol/week: 0.0 standard drinks    Comment: occ  . Drug use: No  . Sexual activity: Not on file  Other Topics Concern  . Not on file  Social History Narrative  . Not on file   Social Determinants of Health   Financial Resource Strain:   . Difficulty of Paying Living Expenses: Not on file  Food Insecurity:   . Worried About Charity fundraiser in the Last Year: Not on file  . Ran Out of Food in the  Last Year: Not on file  Transportation Needs:   . Lack of Transportation (Medical): Not on file  . Lack of Transportation (Non-Medical): Not on file  Physical Activity:   . Days of Exercise per Week: Not on file  . Minutes of Exercise per Session: Not on file  Stress:   . Feeling of Stress : Not on file  Social Connections:   . Frequency of Communication with Friends and Family: Not on file  . Frequency of Social Gatherings with Friends and Family: Not  on file  . Attends Religious Services: Not on file  . Active Member of Clubs or Organizations: Not on file  . Attends Archivist Meetings: Not on file  . Marital Status: Not on file  Intimate Partner Violence:   . Fear of Current or Ex-Partner: Not on file  . Emotionally Abused: Not on file  . Physically Abused: Not on file  . Sexually Abused: Not on file    Family History  Problem Relation Age of Onset  . Mitral valve prolapse Mother   . Heart Problems Mother   . Cancer Mother    Socially she is single. She has no children. She has been smoking for approximately 40 years and still smokes 1-11/2 pack per day per. 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   ROS General: Negative; No fevers, chills, or night sweats; positive for fatigue HEENT: Negative; No changes in vision or hearing, sinus congestion, difficulty swallowing Pulmonary: Positive for mild shortness of breath with activity Cardiovascular: Negative; No chest pain, presyncope, syncope, palpitations GI: Negative; No nausea, vomiting, diarrhea, or abdominal pain GU: Negative; No dysuria, hematuria, or difficulty voiding Musculoskeletal: Negative; no myalgias, joint pain, or weakness Hematologic/Oncology: Negative; no easy bruising, bleeding Endocrine: Negative; no heat/cold intolerance; no diabetes Neuro: Negative; no changes in balance, headaches Skin: Negative; No rashes or skin lesions Psychiatric: Negative; No behavioral problems, depression Sleep: Negative; No snoring, daytime sleepiness, hypersomnolence, bruxism, restless legs, hypnogognic hallucinations, no cataplexy Other comprehensive 14 point system review is negative.   PE BP 137/84   Pulse 71   Temp (!) 97 F (36.1 C)   Ht 5' 2"  (1.575 m)   Wt 174 lb (78.9 kg) Comment: PT GUESSED WEIGHT. REFUSED TO WEIGH  LMP 06/25/2011   SpO2 96%   BMI 31.83 kg/m    Repeat blood pressure by me was 124/76  Wt Readings from Last 3  Encounters:  01/10/19 174 lb (78.9 kg)  02/09/18 180 lb (81.6 kg)  11/07/17 168 lb (76.2 kg)   General: Alert, oriented, no distress.  Skin: normal turgor, no rashes, warm and dry HEENT: Normocephalic, atraumatic. Pupils equal round and reactive to light; sclera anicteric; extraocular muscles intact;  Nose without nasal septal hypertrophy Mouth/Parynx benign; Mallinpatti scale 3 Neck: No JVD, no carotid bruits; normal carotid upstroke Lungs: clear to ausculatation and percussion; no wheezing or rales Chest wall: without tenderness to palpitation Heart: PMI not displaced, RRR, s1 s2 normal, 1/6 systolic murmur, no diastolic murmur, no rubs, gallops, thrills, or heaves Abdomen: soft, nontender; no hepatosplenomehaly, BS+; abdominal aorta nontender and not dilated by palpation. Back: no CVA tenderness Pulses 2+ Musculoskeletal: full range of motion, normal strength, no joint deformities Extremities: no clubbing cyanosis or edema, Homan's sign negative  Neurologic: grossly nonfocal; Cranial nerves grossly wnl Psychologic: Normal mood and affect  ECG (independently read by me): Normal sinus rhythm at 70 bpm.  Nonspecific T wave abnormality.  Normal intervals.  January 2020 ECG (independently read by me): Normal sinus rhythm at 69 bpm.  Incomplete right bundle branch block.  LVH.  No ectopy.  Normal intervals.  October 2018 ECG (independently read by me): Normal sinus rhythm at 67 bpm.  LVH by voltage.  Nonspecific ST changes.  QTc interval 445 ms.  May 2018 ECG (independently read by me): Normal sinus rhythm at 70 bpm.  Borderline voltage criteria for LVH.  QTc interval 451 ms.  PR interval normal.  Nose ST-T change.  August 2016 ECG (independently read by me): Normal sinus rhythm at 65 bpm.  Mild RV conduction delay.  No securing ST-T changes.  May 2015 ECG (independently read by me): Normal sinus rhythm at 67 bpm.  QTc interval 443 ms.  Nonspecific T-wave changes.  October 2014 ECG:  Sinus rhythm at 69 beats per minute. Mild RV conduction delay. Normal intervals.  LABS: BMP Latest Ref Rng & Units 11/25/2017 01/28/2017 08/12/2016  Glucose 65 - 99 mg/dL 84 90 126(H)  BUN 6 - 24 mg/dL 17 16 17   Creatinine 0.57 - 1.00 mg/dL 0.93 0.92 1.02(H)  BUN/Creat Ratio 9 - 23 18 17 17   Sodium 134 - 144 mmol/L 140 141 141  Potassium 3.5 - 5.2 mmol/L 4.3 4.3 4.9  Chloride 96 - 106 mmol/L 98 100 101  CO2 20 - 29 mmol/L 27 26 26   Calcium 8.7 - 10.2 mg/dL 9.8 9.9 9.6   Hepatic Function Latest Ref Rng & Units 11/25/2017 01/28/2017 08/12/2016  Total Protein 6.0 - 8.5 g/dL 6.7 6.7 6.8  Albumin 3.5 - 5.5 g/dL 4.7 4.6 4.6  AST 0 - 40 IU/L 21 22 26   ALT 0 - 32 IU/L 21 21 23   Alk Phosphatase 39 - 117 IU/L 109 83 90  Total Bilirubin 0.0 - 1.2 mg/dL <0.2 0.3 0.3  Bilirubin, Direct 0.00 - 0.40 mg/dL 0.08 - -   CBC Latest Ref Rng & Units 08/12/2016 03/13/2016 08/20/2014  WBC 3.4 - 10.8 x10E3/uL 6.4 10.6(H) 5.6  Hemoglobin 11.1 - 15.9 g/dL 14.2 14.9 13.7  Hematocrit 34.0 - 46.6 % 41.9 43.4 40.3  Platelets 150 - 379 x10E3/uL 230 186 179   Lab Results  Component Value Date   MCV 93 08/12/2016   MCV 89.1 03/13/2016   MCV 94.1 08/20/2014   Lab Results  Component Value Date   TSH 0.804 08/12/2016   Lab Results  Component Value Date   HGBA1C 5.6 01/28/2017    Lipid Panel     Component Value Date/Time   CHOL 235 (H) 11/25/2017 1155   TRIG 113 11/25/2017 1155   HDL 70 11/25/2017 1155   CHOLHDL 3.4 11/25/2017 1155   CHOLHDL 2.9 11/22/2013 0904   VLDL 16 11/22/2013 0904   LDLCALC 142 (H) 11/25/2017 1155     RADIOLOGY: Dg Chest 2 View  11/10/2012   CLINICAL DATA:  Cough and chest pain  EXAM: CHEST  2 VIEW  COMPARISON:  July 30, 2012  FINDINGS: There is a calcified granuloma in the left lower lobe. Lungs are otherwise clear. Heart size and pulmonary vascularity are normal. No adenopathy. There are no appreciable bone lesions.  IMPRESSION: Granuloma left base. No edema or consolidation    Electronically Signed   By: Lowella Grip M.D.   On: 11/10/2012 15:22   Mr Shoulder Left Wo Contrast  11/12/2012   CLINICAL DATA:  Extreme shoulder pain for months. Prior shoulder surgery approximately 1 year ago.  EXAM: MRI OF THE LEFT SHOULDER WITHOUT CONTRAST  TECHNIQUE: Multiplanar, multisequence MR imaging of the shoulder was performed. No intravenous contrast was administered.  COMPARISON:  Left shoulder MRI 06/04/2011.  FINDINGS: Rotator cuff: Interval development of infraspinous tendinosis with hyperintensity extending to the musculotendinous junction. No focal tear is demonstrated. There is stable mild supraspinatus and subscapularis tendinosis.  Muscles:  No focal muscular atrophy or edema.  Biceps long head: The intra-articular portion of the biceps tendon is now diminutive with irregular signal consistent with tendinosis and partial tearing. No full-thickness tendon tear or tendon dislocation is identified.  Acromioclavicular Joint: The acromion is type 1. Patient has undergone interval distal clavicle resection and probable acromioplasty. There is no osseous encroachment on the rotator cuff passageway. There is no significant fluid in the subacromial -subdeltoid space.  Glenohumeral Joint: No significant shoulder joint effusion. The previously demonstrated edema and cystic changes superiorly in the glenoid have improved. However, there is new edema and subchondral cyst formation in the anterior inferior glenoid.  Labrum: There is labral degeneration without evidence of discrete tear.  Bones: No significant extra-articular osseous findings. The cystic changes previously noted medial to the lesser tuberosity have improved.  IMPRESSION: 1. Interval distal clavicle resection and acromioplasty. No rotator cuff impingement identified. 2. Infraspinous tendinosis without evidence of rotator cuff tear. 3. New diminution of the long head of the biceps tendon consistent with partial tearing. 4. Progressive  glenohumeral degenerative changes with new subchondral cyst formation and edema in the anterior inferior glenoid. No discrete labral tear identified.   Electronically Signed   By: Camie Patience M.D.   On: 11/12/2012 12:14    IMPRESSION:  1. Hyperlipidemia with target LDL less than 70   2. Aortic atherosclerosis (HCC)   3. Atherosclerosis of native coronary artery of native heart without angina pectoris   4. Palpitations   5. Grade I diastolic dysfunction   6. Tobacco abuse   7. Centrilobular emphysema ([place])     ASSESSMENT AND PLAN: Ms Bau is a 58 year old female who has a 60+ pack year history of tobacco use.  She has a history of palpitations and being on atenolol morning and 25 mg in the evening.,  She has a greater than 60-pack year history of tobacco use and continues to smoke.   Surveillance low-dose CT imaging has been relatively stable.  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  This had not changed on her most recent study of December 29, 2018.  Prior echo Doppler study has shown normal systolic function with grade 1 diastolic dysfunction.  A nuclear perfusion study from 2018 was low risk with post-rest EF at 58%.  She is now on Symbicort with plans for dose increase and also takes Singulair and denies recent wheezing.  Lipid studies have become progressively more abnormal.  She could not tolerate statins and apparently she felt Zetia made her depression worse.  I had [employer], Pharm.D. see her today and we have set her up for hopeful assistance with [place] through the community.  She will fill out the application and Kristen plans to get back in touch with her shortly.  She continues to be on Lamictal, Effexor and Desyrel for her bipolar disorder.  I will see her in 6 months for reevaluation  Time spent: 25 minutes  Troy Sine, MD, [facility]  01/12/2019 5:06 PM

## 2019-01-10 NOTE — Patient Instructions (Addendum)
Follow-Up: IN 6 months Please call our office 2 months in advance, APR 2021 to schedule this JUN 2021 appointment. In Person Shelva Majestic, MD.    Special Instructions: Wait for Cyril Mourning to call you before you do the following: Patient Assistance:  The [employer] foundation offers assistance to help pay for medication copays.  They will cover copays for all cholesterol lowering meds, including statins, fibrates, omega-3 oils, ezetimibe, Repatha, Praluent, Nexletol, Nexlizet.  The cards are usually good for $2,500 or 12 months, whichever comes first. 1.    Go to healthwellfoundation.org 2.    Click on "Apply Now" 3.    Answer questions as to whom is applying (patient or representative) 4.    Your disease fund will be "hypercholesterolemia - Medicare access" 5.    They will ask questions about finances and which medications you are taking for cholesterol 6.    When you submit, the approval is usually within minutes.  You will need to print the card information from the site 7.    You will need to show this information to your pharmacy, they will bill your Medicare Part D plan first -then bill [employer] Well --for the copay. You can also call them at [phone], although the hold times can be quite long.  Reduce your risk of getting COVID-19 With your heart disease it is especially important for people at increased risk of severe illness from COVID-19, and those who live with them, to protect themselves from getting COVID-19. The best way to protect yourself and to help reduce the spread of the virus that causes COVID-19 is to: Marland Kitchen Limit your interactions with other people as much as possible. . Take precautions to prevent getting COVID-19 when you do interact with others. If you start feeling sick and think you may have COVID-19, get in touch with your healthcare provider within 24  At [facility], you and your health needs are our priority.  As part of our continuing mission to provide you with  exceptional heart care, we have created designated Provider Care Teams.  These Care Teams include your primary Cardiologist (physician) and Advanced Practice Providers (APPs -  Physician Assistants and Nurse Practitioners) who all work together to provide you with the care you need, when you need it.  Thank you for choosing [facility] at [facility]!!        Happy Holidays!!

## 2019-01-11 ENCOUNTER — Telehealth: Payer: Self-pay

## 2019-01-11 MED ORDER — REPATHA SURECLICK 140 MG/ML ~~LOC~~ SOAJ: 140.0000 mg | SUBCUTANEOUS | 6 refills | Status: DC

## 2019-01-11 NOTE — Telephone Encounter (Signed)
Called and lmomed the pt stating the repatha approved and rx sent. Instructed the pt to call us back w/any questions

## 2019-01-12 ENCOUNTER — Encounter: Payer: Self-pay | Admitting: Cardiovascular Disease

## 2019-01-14 DIAGNOSIS — H9193 Unspecified hearing loss, bilateral: Secondary | ICD-10-CM | POA: Insufficient documentation

## 2019-01-14 DIAGNOSIS — H8103 Meniere's disease, bilateral: Secondary | ICD-10-CM | POA: Insufficient documentation

## 2019-01-15 ENCOUNTER — Other Ambulatory Visit: Payer: Self-pay

## 2019-01-15 ENCOUNTER — Ambulatory Visit: Payer: Medicare HMO | Admitting: Psychiatry

## 2019-01-15 DIAGNOSIS — I10 Essential (primary) hypertension: Secondary | ICD-10-CM

## 2019-01-15 DIAGNOSIS — I2510 Atherosclerotic heart disease of native coronary artery without angina pectoris: Secondary | ICD-10-CM

## 2019-01-15 DIAGNOSIS — R002 Palpitations: Secondary | ICD-10-CM

## 2019-01-15 DIAGNOSIS — E785 Hyperlipidemia, unspecified: Secondary | ICD-10-CM

## 2019-01-15 DIAGNOSIS — I700 Atherosclerosis of aorta: Secondary | ICD-10-CM

## 2019-02-01 ENCOUNTER — Telehealth: Payer: Self-pay | Admitting: Internal Medicine

## 2019-02-01 DIAGNOSIS — J449 Chronic obstructive pulmonary disease, unspecified: Secondary | ICD-10-CM

## 2019-02-01 MED ORDER — ALBUTEROL SULFATE HFA 108 (90 BASE) MCG/ACT IN AERS: 2.0000 | INHALATION_SPRAY | RESPIRATORY_TRACT | 11 refills | Status: DC | PRN

## 2019-02-01 MED ORDER — BUDESONIDE-FORMOTEROL FUMARATE 160-4.5 MCG/ACT IN AERO: 2.0000 | INHALATION_SPRAY | Freq: Two times a day (BID) | RESPIRATORY_TRACT | 11 refills | Status: DC

## 2019-02-01 NOTE — Telephone Encounter (Signed)
Instructions from OV 01/01/19   It is my opinion that you are not physically able to participate in jury duty at this time and it is a hazard to your health due to potential for COVID-19 exposure  The key is to stop smoking completely before smoking completely stops you!   Continue symbicort 80 as you are now    Only use your albuterol as a rescue medication to be used if you can't catch your breath by resting or doing a relaxed purse lip breathing pattern.  - The less you use it, the better it will work when you need it. - Ok to use up to 2 puffs  every 4 hours if you must but call for immediate appointment if use goes up over your usual need - Don't leave home without it !!  (think of it like the spare tire for your car)   Call us back after the first of the year for follow up and a new strength for symbicort when you cost will be less   Please schedule a follow up visit in 6 months but call sooner if needed  - ask re kidney cyst on follow      Called and spoke with pt who stated she was ready to have rx for the symb 160 called into the pharmacy and also stated she needed rx for albuterol inhaler to be sent in. I verified pt's preferred pharmacy and sent both meds to pharmacy for pt and I also scheduled pt a f/u with MW.nothing further needed.

## 2019-02-12 ENCOUNTER — Ambulatory Visit (INDEPENDENT_AMBULATORY_CARE_PROVIDER_SITE_OTHER): Payer: Medicare HMO | Admitting: Psychiatry

## 2019-02-12 ENCOUNTER — Encounter: Payer: Self-pay | Admitting: Psychiatry

## 2019-02-12 DIAGNOSIS — F5101 Primary insomnia: Secondary | ICD-10-CM

## 2019-02-12 DIAGNOSIS — F17210 Nicotine dependence, cigarettes, uncomplicated: Secondary | ICD-10-CM | POA: Diagnosis not present

## 2019-02-12 DIAGNOSIS — F3162 Bipolar disorder, current episode mixed, moderate: Secondary | ICD-10-CM | POA: Diagnosis not present

## 2019-02-12 MED ORDER — VENLAFAXINE HCL ER 75 MG PO CP24: 225.0000 mg | ORAL_CAPSULE | Freq: Every day | ORAL | 0 refills | Status: DC

## 2019-02-12 MED ORDER — TRAZODONE HCL 100 MG PO TABS: 200.0000 mg | ORAL_TABLET | Freq: Every day | ORAL | 0 refills | Status: DC

## 2019-02-12 MED ORDER — LAMOTRIGINE 200 MG PO TABS: ORAL_TABLET | ORAL | 1 refills | Status: DC

## 2019-02-12 MED ORDER — L-METHYLFOLATE 15 MG PO TABS: 1.0000 | ORAL_TABLET | Freq: Every day | ORAL | 5 refills | Status: DC

## 2019-02-12 NOTE — Progress Notes (Signed)
Anita Arroyo January 31, 1960 59 y.o.  Virtual Visit via Telephone Note  I connected with pt on 02/12/19 at  2:45 PM EST by telephone and verified that I am speaking with the correct person using two identifiers.   I discussed the limitations, risks, security and privacy concerns of performing an evaluation and management service by telephone and the availability of in person appointments. I also discussed with the patient that there may be a patient responsible charge related to this service. The patient expressed understanding and agreed to proceed.   I discussed the assessment and treatment plan with the patient. The patient was provided an opportunity to ask questions and all were answered. The patient agreed with the plan and demonstrated an understanding of the instructions.   The patient was advised to call back or seek an in-person evaluation if the symptoms worsen or if the condition fails to improve as anticipated.  I provided 30 minutes of non-face-to-face time during this encounter.  The patient was located at home.  The provider was located at [place].   Thayer Headings, PMHNP   Subjective:   Patient ID:  Anita Arroyo is a 58 y.o. (DOB 05-22-1960) female.  Chief Complaint:  Chief Complaint  Patient presents with  . Other    Nicotine Dependence  . Follow-up    h/o Bipolar D/O and anxiety    HPI Anita Arroyo presents for follow-up of mood and anxiety. She reports that she has been working on a large project at home with cleaning and de-cluttering. She reports that this has had a positive effect on her mood. She reports that her mood has been stable overall. She denies any significant irritability, aside from some increased irritability with stomach aches. Denies any anxiety. Reports adequate sleep overall aside from occ awakenings to use the bathroom. Reports that weighted blanket has also helped with her sleep. Reports that RLS has been fairly well  controlled. Appetite has been good. Reports that her weight has been stable. Energy has been low. Motivation was good for trying to clean out her house by Christmas. Reports that motivation has been lower since then. Reports that she has been keeping her house picked up. Concentration is adequate. Denies SI.   Reports that she has wanted to stop smoking. She took Chantix in the past and was able to quit smoking for 6 months. She had to take Ambien in order to be able to sleep. She reports that her second course of Chantix did not go as well and had severe sleep disturbance. Has been using nicotine gum. H/o severe adverse reaction to Wellbutrin XL- "I was climbing the walls." Has been trying to cut down.   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 Has been doing some dog walking and occ sitting.   Review of Systems:  Review of Systems  Constitutional:       Occ night sweats.   HENT: Positive for sinus pressure.   Respiratory: Positive for cough.   Gastrointestinal: Positive for abdominal pain.  Musculoskeletal: Positive for arthralgias and back pain. Negative for gait problem.  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  Medications: I have reviewed the patient's current medications.  Current Outpatient Medications  Medication Sig Dispense Refill  .  albuterol (VENTOLIN HFA) 108 (90 Base) MCG/ACT inhaler Inhale 2 puffs into the lungs every 4 (four) hours as needed for wheezing or shortness of breath (cough, shortness of breath or wheezing.). 8 g 11  . atenolol (TENORMIN) 50 MG tablet Take 50 mg in the morning and 25 mg in the evening 135 tablet 1  . azithromycin (ZITHROMAX) 250 MG tablet Take two tablets on the first day and then one tablet every day after.    . Biotin (BIOTIN 5000) 5 MG  CAPS Take 5 mg by mouth daily.    . budesonide-formoterol (SYMBICORT) 160-4.5 MCG/ACT inhaler Inhale 2 puffs into the lungs 2 (two) times daily. 1 Inhaler 11  . Calcium-Magnesium-Vitamin D (CALCIUM 1200+D3 PO) Take by mouth.    . cetirizine (ZYRTEC) 10 MG tablet Take 10 mg by mouth daily.     . diclofenac sodium (VOLTAREN) 1 % GEL     . Evolocumab (REPATHA SURECLICK) 858 MG/ML SOAJ Inject 140 mg into the skin every 14 (fourteen) days. 2 pen 6  . fluticasone (FLONASE) 50 MCG/ACT nasal spray USE 1 SPRAY IN EACH NOSTRIL TWICE DAILY 48 g 1  . HYDROcodone-acetaminophen (NORCO/VICODIN) 5-325 MG tablet take 1 TABLET BY MOUTH EVERY 6 HOURS AS NEEDED for moderate pain 120 tablet 0  . L-Methylfolate 15 MG TABS Take 1 tablet (15 mg total) by mouth daily. 30 tablet 5  . lamoTRIgine (LAMICTAL) 200 MG tablet TAKE 1/2 TABLET EVERY MORNING  AND TAKE 2 TABLETS AT BEDTIME 225 tablet 1  . Levomefolate Glucosamine (METHYLFOLATE PO) Take by mouth.    . Magnesium 250 MG TABS Take 250 mg by mouth daily.    . meclizine (ANTIVERT) 25 MG tablet Take 1 tablet (25 mg total) by mouth as needed for dizziness. 30 tablet 1  . meloxicam (MOBIC) 15 MG tablet TAKE 1 TABLET BY MOUTH ONCE A DAY AS NEEDED INFLAMMATION    . methocarbamol (ROBAXIN) 500 MG tablet TAKE 1 TABLET BY MOUTH EVERY 6 TO 8 HOURS AS NEEDED FOR SPASMS/MUSCLE TENSION    . montelukast (SINGULAIR) 10 MG tablet Take 1 tablet (10 mg total) at bedtime by mouth. 90 tablet 3  . pantoprazole (PROTONIX) 40 MG tablet Take 1 tablet (40 mg total) by mouth daily. 90 tablet 1  . traZODone (DESYREL) 100 MG tablet Take 2 tablets (200 mg total) by mouth at bedtime. 180 tablet 0  . triamcinolone cream (KENALOG) 0.1 % Apply 1 application topically 2 (two) times daily. 30 g 0  . triamterene-hydrochlorothiazide (MAXZIDE-25) 37.5-25 MG tablet Take 0.5 tablets daily by mouth. 90 tablet 3  . venlafaxine XR (EFFEXOR-XR) 75 MG 24 hr capsule Take 3 capsules (225 mg total) by mouth daily  with breakfast. 270 capsule 0  . Alcohol Swabs (B-D SINGLE USE SWABS REGULAR) PADS     . blood glucose meter kit and supplies KIT Dispense based on patient and insurance preference. Use daily as directed. (FOR ICD-9 250.00, 250.01). 1 each 0  . ezetimibe (ZETIA) 10 MG tablet Take 1 tablet (10 mg total) by mouth daily. (Patient not taking: Reported on 01/10/2019) 30 tablet 5  . TRUE METRIX BLOOD GLUCOSE TEST test strip     . TRUEPLUS LANCETS 33G MISC      No current facility-administered medications for this visit.    Medication Side Effects: None  Allergies:  Allergies  Allergen Reactions  . Contrast Media [Iodinated Diagnostic Agents] Anaphylaxis    Anaphylactic shock.  . Iodine Shortness Of Breath  . Adhesive [Tape]  Other (See Comments)    Reaction:  Blisters   . Celecoxib     "I climb the walls"  . Statins     Body aches, constipation AND DEPRESSION  . Ceclor [Cefaclor] Rash  . Moxifloxacin Rash  . Sulfonamide Derivatives Nausea And Vomiting    Past Medical History:  Diagnosis Date  . Arthritis   . Asthma   . Bipolar 1 disorder ([place])   . Bipolar 1 disorder ([place])   . Breast cancer ([place])   . Depression   . Dysrhythmia    palpitations  . Dysrhythmia    atrial fibrillation  . Fibromyalgia   . Finger fracture 2011  . GERD (gastroesophageal reflux disease)   . Heart murmur   . History of stress test 04/28/2011   showed normal perfusion without scar or ischemia  . Hx of echocardiogram 04/28/2011   showed normal systolic function with mild diastolic dysfunction,she had trace MR but did not have frank mitral valve prolapse demonstrated. She had mild pulmonary hypertension with an estimated RV systolic pressure at 34 mm.  . Hyperlipemia   . Meniere disease   . MVP (mitral valve prolapse)   . Neuromuscular disorder ([place])    fibromyalgia    Family History  Problem Relation Age of Onset  . Mitral valve prolapse Mother   . Heart Problems Mother   . Cancer Mother      Social History   Socioeconomic History  . Marital status: Single    Spouse name: Not on file  . Number of children: Not on file  . Years of education: Not on file  . Highest education level: Not on file  Occupational History  . Not on file  Tobacco Use  . Smoking status: Current Every Day Smoker    Packs/day: 1.50    Years: 40.00    Pack years: 60.00    Types: Cigarettes  . Smokeless tobacco: Never Used  . Tobacco comment: Cannot afford chantix. Working on getting discount from drug compny  Substance and Sexual Activity  . Alcohol use: Yes    Alcohol/week: 0.0 standard drinks    Comment: occ  . Drug use: No  . Sexual activity: Not on file  Other Topics Concern  . Not on file  Social History Narrative  . Not on file   Social Determinants of Health   Financial Resource Strain:   . Difficulty of Paying Living Expenses: Not on file  Food Insecurity:   . Worried About Charity fundraiser in the Last Year: Not on file  . Ran Out of Food in the Last Year: Not on file  Transportation Needs:   . Lack of Transportation (Medical): Not on file  . Lack of Transportation (Non-Medical): Not on file  Physical Activity:   . Days of Exercise per Week: Not on file  . Minutes of Exercise per Session: Not on file  Stress:   . Feeling of Stress : Not on file  Social Connections:   . Frequency of Communication with Friends and Family: Not on file  . Frequency of Social Gatherings with Friends and Family: Not on file  . Attends Religious Services: Not on file  . Active Member of Clubs or Organizations: Not on file  . Attends Archivist Meetings: Not on file  . Marital Status: Not on file  Intimate Partner Violence:   . Fear of Current or Ex-Partner: Not on file  . Emotionally Abused: Not on file  . Physically Abused:  Not on file  . Sexually Abused: Not on file    Past Medical History, Surgical history, Social history, and Family history were reviewed and updated as  appropriate.   Please see review of systems for further details on the patient's review from today.   Objective:   Physical Exam:  LMP 06/25/2011   Physical Exam Neurological:     Mental Status: She is alert and oriented to person, place, and time.     Cranial Nerves: No dysarthria.  Psychiatric:        Attention and Perception: Attention and perception normal.        Mood and Affect: Mood normal.        Speech: Speech normal.        Behavior: Behavior is cooperative.        Thought Content: Thought content normal. Thought content is not paranoid or delusional. Thought content does not include homicidal or suicidal ideation. Thought content does not include homicidal or suicidal plan.        Cognition and Memory: Cognition and memory normal.        Judgment: Judgment normal.     Comments: Insight intact     Lab Review:     Component Value Date/Time   NA 140 11/25/2017 1155   NA 138 08/20/2014 1117   K 4.3 11/25/2017 1155   K 4.7 08/20/2014 1117   CL 98 11/25/2017 1155   CL 104 06/29/2012 1221   CO2 27 11/25/2017 1155   CO2 26 08/20/2014 1117   GLUCOSE 84 11/25/2017 1155   GLUCOSE 82 03/13/2016 1720   GLUCOSE 92 08/20/2014 1117   GLUCOSE 107 (H) 06/29/2012 1221   BUN 17 11/25/2017 1155   BUN 16.0 08/20/2014 1117   CREATININE 0.93 11/25/2017 1155   CREATININE 0.9 08/20/2014 1117   CALCIUM 9.8 11/25/2017 1155   CALCIUM 9.4 08/20/2014 1117   PROT 6.7 11/25/2017 1155   PROT 6.6 08/20/2014 1117   ALBUMIN 4.7 11/25/2017 1155   ALBUMIN 3.9 08/20/2014 1117   AST 21 11/25/2017 1155   AST 22 08/20/2014 1117   ALT 21 11/25/2017 1155   ALT 18 08/20/2014 1117   ALKPHOS 109 11/25/2017 1155   ALKPHOS 54 08/20/2014 1117   BILITOT <0.2 11/25/2017 1155   BILITOT 0.47 08/20/2014 1117   GFRNONAA 68 11/25/2017 1155   GFRAA 78 11/25/2017 1155       Component Value Date/Time   WBC 6.4 08/12/2016 1659   WBC 10.6 (H) 03/13/2016 1720   RBC 4.53 08/12/2016 1659   RBC 4.87  03/13/2016 1720   HGB 14.2 08/12/2016 1659   HGB 13.7 08/20/2014 1117   HCT 41.9 08/12/2016 1659   HCT 40.3 08/20/2014 1117   PLT 230 08/12/2016 1659   MCV 93 08/12/2016 1659   MCV 94.1 08/20/2014 1117   MCH 31.3 08/12/2016 1659   MCH 30.6 03/13/2016 1720   MCHC 33.9 08/12/2016 1659   MCHC 34.3 03/13/2016 1720   RDW 14.1 08/12/2016 1659   RDW 13.2 08/20/2014 1117   LYMPHSABS 2.4 03/13/2016 1720   LYMPHSABS 2.0 08/20/2014 1117   MONOABS 0.9 03/13/2016 1720   MONOABS 0.5 08/20/2014 1117   EOSABS 0.0 03/13/2016 1720   EOSABS 0.1 08/20/2014 1117   BASOSABS 0.0 03/13/2016 1720   BASOSABS 0.1 08/20/2014 1117    No results found for: POCLITH, LITHIUM   No results found for: PHENYTOIN, PHENOBARB, VALPROATE, CBMZ   .res Assessment: Plan:   30 minutes spent counseling pt  re: desire for smoking cessation and possible tx options. She has had a serious adverse reaction to Wellbutrin XL in the past and has had some side effects with chantix in the past that were mitigated with the use of Ambien. She reports that she would prefer to start with nicotine gum to help decrease nicotine consumption. Reports that she may wish to re-start Chantix once her income increases and will contact office for prescription if she wishes to start Chantix. May also need script for Ambien at that time. Also recommended smoking cessation resources to include 1-800-QUIT-Now and https://scott-booker.info/ Will continue current medications without changes since pt reports that mood, anxiety, and insomnia is currently well controlled.  Pt to f/u in 6 months or sooner if clinically indicated.  Patient advised to contact office with any questions, adverse effects, or acute worsening in signs and symptoms.  Anita Arroyo was seen today for other and follow-up.  Diagnoses and all orders for this visit:  Bipolar 1 disorder, mixed, moderate (HCC) -     L-Methylfolate 15 MG TABS; Take 1 tablet (15 mg total) by mouth daily. -      venlafaxine XR (EFFEXOR-XR) 75 MG 24 hr capsule; Take 3 capsules (225 mg total) by mouth daily with breakfast. -     lamoTRIgine (LAMICTAL) 200 MG tablet; TAKE 1/2 TABLET EVERY MORNING  AND TAKE 2 TABLETS AT BEDTIME  Primary insomnia -     traZODone (DESYREL) 100 MG tablet; Take 2 tablets (200 mg total) by mouth at bedtime.  Cigarette nicotine dependence without complication    Please see After Visit Summary for patient specific instructions.  Future Appointments  Date Time Provider [place]  02/23/2019  4:40 PM [facility] 3 [facility]  07/02/2019  1:45 PM Tanda Rockers, MD [facility] None  08/13/2019  1:30 PM Thayer Headings, PMHNP [facility] None    No orders of the defined types were placed in this encounter.     -------------------------------

## 2019-02-23 ENCOUNTER — Ambulatory Visit
Admission: RE | Admit: 2019-02-23 | Discharge: 2019-02-23 | Disposition: A | Discharge status: Home / Self Care | Payer: Medicare HMO | Source: Ambulatory Visit | Attending: Physician Assistant | Admitting: Physician Assistant

## 2019-02-23 ENCOUNTER — Other Ambulatory Visit: Payer: Self-pay

## 2019-02-23 DIAGNOSIS — E559 Vitamin D deficiency, unspecified: Secondary | ICD-10-CM

## 2019-02-28 ENCOUNTER — Other Ambulatory Visit: Payer: Self-pay | Admitting: Physician Assistant

## 2019-02-28 DIAGNOSIS — R928 Other abnormal and inconclusive findings on diagnostic imaging of breast: Secondary | ICD-10-CM

## 2019-03-06 ENCOUNTER — Other Ambulatory Visit: Payer: Self-pay

## 2019-03-06 ENCOUNTER — Other Ambulatory Visit: Payer: Self-pay | Admitting: Acute Care

## 2019-03-06 ENCOUNTER — Ambulatory Visit
Admission: RE | Admit: 2019-03-06 | Discharge: 2019-03-06 | Disposition: A | Discharge status: Home / Self Care | Payer: Medicare HMO | Source: Ambulatory Visit | Attending: Physician Assistant | Admitting: Physician Assistant

## 2019-03-06 DIAGNOSIS — R921 Mammographic calcification found on diagnostic imaging of breast: Secondary | ICD-10-CM

## 2019-03-06 DIAGNOSIS — R928 Other abnormal and inconclusive findings on diagnostic imaging of breast: Secondary | ICD-10-CM

## 2019-03-07 ENCOUNTER — Other Ambulatory Visit: Payer: Self-pay | Admitting: Physician Assistant

## 2019-03-07 DIAGNOSIS — R921 Mammographic calcification found on diagnostic imaging of breast: Secondary | ICD-10-CM

## 2019-03-14 ENCOUNTER — Other Ambulatory Visit: Payer: Self-pay | Admitting: Diagnostic Radiology

## 2019-03-14 ENCOUNTER — Ambulatory Visit
Admission: RE | Admit: 2019-03-14 | Discharge: 2019-03-14 | Disposition: A | Discharge status: Home / Self Care | Payer: Medicare HMO | Source: Ambulatory Visit | Attending: Physician Assistant | Admitting: Physician Assistant

## 2019-03-14 ENCOUNTER — Ambulatory Visit
Admission: RE | Admit: 2019-03-14 | Discharge: 2019-03-14 | Disposition: A | Discharge status: Home / Self Care | Payer: Medicare HMO | Source: Ambulatory Visit | Attending: Acute Care | Admitting: Acute Care

## 2019-03-14 ENCOUNTER — Other Ambulatory Visit: Payer: Self-pay

## 2019-03-14 DIAGNOSIS — R921 Mammographic calcification found on diagnostic imaging of breast: Secondary | ICD-10-CM

## 2019-03-30 ENCOUNTER — Other Ambulatory Visit: Payer: Self-pay | Admitting: Psychiatry

## 2019-03-30 DIAGNOSIS — F5101 Primary insomnia: Secondary | ICD-10-CM

## 2019-03-30 DIAGNOSIS — F3162 Bipolar disorder, current episode mixed, moderate: Secondary | ICD-10-CM

## 2019-06-20 ENCOUNTER — Telehealth: Payer: Self-pay | Admitting: Internal Medicine

## 2019-06-20 NOTE — Telephone Encounter (Signed)
Patient requesting a new prescription for Flovent. She is currently on Symbicort. Pharmacy reports Flovent is cheaper for the patient. Please advise.

## 2019-06-20 NOTE — Telephone Encounter (Signed)
Spoke with pharmacist, she states the Symbicort is too much for pt and wanted to know if we could change her to Flovent instead. Dr. Melvyn Novas please advise if we can switch inhaler.

## 2019-06-21 MED ORDER — FLOVENT HFA 220 MCG/ACT IN AERO: 2.0000 | INHALATION_SPRAY | Freq: Two times a day (BID) | RESPIRATORY_TRACT | 12 refills | Status: DC

## 2019-06-21 NOTE — Telephone Encounter (Signed)
Attempted to call patient, new prescription for Flovent 2 puffs BID sent to [facility].

## 2019-06-21 NOTE — Telephone Encounter (Signed)
These are not equivalent drugs but ok to try flovent 220 2bid as long as has albuterol to back it up x 2 puffs every 4 hours if needed and return to clinic 6/7 as scheduled with all active meds in hand

## 2019-06-21 NOTE — Telephone Encounter (Signed)
Patient contacted and updated on new script and need for follow up on 07/02/2019 with all medications.

## 2019-06-26 ENCOUNTER — Other Ambulatory Visit: Payer: Self-pay | Admitting: Cardiovascular Disease

## 2019-07-02 ENCOUNTER — Ambulatory Visit: Payer: Medicare HMO | Admitting: Internal Medicine

## 2019-07-13 ENCOUNTER — Other Ambulatory Visit: Payer: Self-pay

## 2019-07-17 ENCOUNTER — Telehealth: Payer: Self-pay | Admitting: Internal Medicine

## 2019-07-17 NOTE — Telephone Encounter (Signed)
Patient contacted by phone. Patient states that Symbicort is over 100 dollars. She wants to be changed to another medication and also wants AirDuo. I am forwarding this message to Dr. Melvyn Novas. Please advise.

## 2019-07-17 NOTE — Telephone Encounter (Signed)
LMTCB x1 for pt.  

## 2019-07-17 NOTE — Telephone Encounter (Signed)
Should be due for ov by now but let her know  air duo is not the same product or device and can't make change without training on this   advair hfa 115 is an option @2  bid   Needs ov either way asap with drug formulary in hand to pick best option and symbiocort sample not available so move up to next avail NP if runs out of meds before I can fit her in

## 2019-07-19 NOTE — Telephone Encounter (Signed)
LMTCB x2 for pt 

## 2019-07-20 NOTE — Telephone Encounter (Signed)
LMTCB x3 for pt. We have attempted to contact pt several times with no success or call back from pt. Per triage protocol, message will be closed.   

## 2019-07-24 ENCOUNTER — Ambulatory Visit: Payer: Medicare HMO | Admitting: Cardiovascular Disease

## 2019-08-13 ENCOUNTER — Encounter: Payer: Self-pay | Admitting: Psychiatry

## 2019-08-13 ENCOUNTER — Telehealth (INDEPENDENT_AMBULATORY_CARE_PROVIDER_SITE_OTHER): Payer: Medicare HMO | Admitting: Psychiatry

## 2019-08-13 DIAGNOSIS — G2581 Restless legs syndrome: Secondary | ICD-10-CM

## 2019-08-13 DIAGNOSIS — F5101 Primary insomnia: Secondary | ICD-10-CM | POA: Diagnosis not present

## 2019-08-13 DIAGNOSIS — F3162 Bipolar disorder, current episode mixed, moderate: Secondary | ICD-10-CM | POA: Diagnosis not present

## 2019-08-13 MED ORDER — L-METHYLFOLATE 15 MG PO TABS: 1.0000 | ORAL_TABLET | Freq: Every day | ORAL | 1 refills | Status: DC

## 2019-08-13 MED ORDER — VENLAFAXINE HCL ER 75 MG PO CP24: 225.0000 mg | ORAL_CAPSULE | Freq: Every day | ORAL | 1 refills | Status: DC

## 2019-08-13 MED ORDER — TRAZODONE HCL 100 MG PO TABS: 200.0000 mg | ORAL_TABLET | Freq: Every day | ORAL | 1 refills | Status: DC

## 2019-08-13 MED ORDER — LAMOTRIGINE 200 MG PO TABS: ORAL_TABLET | ORAL | 1 refills | Status: DC

## 2019-08-13 MED ORDER — PRAMIPEXOLE DIHYDROCHLORIDE 0.125 MG PO TABS: ORAL_TABLET | ORAL | 2 refills | Status: DC

## 2019-08-13 NOTE — Progress Notes (Signed)
Anita Arroyo [id number] 1960-09-22 59 y.o.  Virtual Visit via Telephone Note  I connected with pt on 08/13/19 at  1:30 PM EDT by telephone and verified that I am speaking with the correct person using two identifiers.   I discussed the limitations, risks, security and privacy concerns of performing an evaluation and management service by telephone and the availability of in person appointments. I also discussed with the patient that there may be a patient responsible charge related to this service. The patient expressed understanding and agreed to proceed.   I discussed the assessment and treatment plan with the patient. The patient was provided an opportunity to ask questions and all were answered. The patient agreed with the plan and demonstrated an understanding of the instructions.   The patient was advised to call back or seek an in-person evaluation if the symptoms worsen or if the condition fails to improve as anticipated.  I provided 30 minutes of non-face-to-face time during this encounter.  The patient was located at home.  The provider was located at [place].   Thayer Headings, PMHNP   Subjective:   Patient ID:  Anita Arroyo is a 59 y.o. (DOB 03-09-1960) female.  Chief Complaint:  Chief Complaint  Patient presents with  . Other    RLS  . Follow-up    h/o depression and anxiety    HPI Anita Arroyo presents for follow-up of mood and anxiety. She reports that she has been extremely busy and has not been staying at home due to staying at different homes to pet sit. She reports that "sometimes I get real stressed out" when she does not have enough time to do everything she needs to do. She reports that she had been enjoying her 2 raised gardens and has had some sadness when some of her garden did not yield as much recently. She reports that she lost about 20 lbs and was planting flowers. She reports that her mood was not elevated at that time- "I was really happy and  excited about the garden." She reports that she may try to plant things again. She reports that her mood has been ok otherwise. She denies any significant anxiety. She reports that she has started having some compulsive behaviors, such as needing things to be even and on both sides. She reports adequate sleep. She reports that she has been having increased RLS and now it is happening at different times and occasionally keeps her up at night. Energy was improved and is ok now. Motivation has been ok. Appetite has been ok. Denies concentration difficulties. Denies SI.    Review of Systems:  Review of Systems  Musculoskeletal: Positive for back pain. Negative for gait problem.       Ankle pain.   Neurological:       Worsening RLS  Psychiatric/Behavioral:       Please refer to HPI   She reports improved cholesterol with Repatha.    Medications: I have reviewed the patient's current medications.  Current Outpatient Medications  Medication Sig Dispense Refill  . atenolol (TENORMIN) 50 MG tablet Take 50 mg in the morning and 25 mg in the evening 135 tablet 1  . Biotin (BIOTIN 5000) 5 MG CAPS Take 5 mg by mouth daily.    . budesonide-formoterol (SYMBICORT) 160-4.5 MCG/ACT inhaler Inhale 2 puffs into the lungs 2 (two) times daily. 1 Inhaler 11  . Calcium-Magnesium-Vitamin D (CALCIUM 1200+D3 PO) Take by mouth.    . cetirizine (  ZYRTEC) 10 MG tablet Take 10 mg by mouth daily.     . Evolocumab (REPATHA SURECLICK) 671 MG/ML SOAJ     . fluticasone (FLONASE) 50 MCG/ACT nasal spray USE 1 SPRAY IN EACH NOSTRIL TWICE DAILY 48 g 1  . HYDROcodone-acetaminophen (NORCO/VICODIN) 5-325 MG tablet take 1 TABLET BY MOUTH EVERY 6 HOURS AS NEEDED for moderate pain 120 tablet 0  . L-Methylfolate 15 MG TABS Take 1 tablet (15 mg total) by mouth daily. 90 tablet 1  . lamoTRIgine (LAMICTAL) 200 MG tablet TAKE 1/2 TABLET EVERY MORNING  AND TAKE 2 TABLETS AT BEDTIME 225 tablet 1  . Magnesium 250 MG TABS Take 250 mg by mouth  daily.    . meclizine (ANTIVERT) 25 MG tablet Take 1 tablet (25 mg total) by mouth as needed for dizziness. 30 tablet 1  . meloxicam (MOBIC) 15 MG tablet TAKE 1 TABLET BY MOUTH ONCE A DAY AS NEEDED INFLAMMATION    . methocarbamol (ROBAXIN) 500 MG tablet TAKE 1 TABLET BY MOUTH EVERY 6 TO 8 HOURS AS NEEDED FOR SPASMS/MUSCLE TENSION    . montelukast (SINGULAIR) 10 MG tablet Take 1 tablet (10 mg total) at bedtime by mouth. 90 tablet 3  . pantoprazole (PROTONIX) 40 MG tablet Take 1 tablet (40 mg total) by mouth daily. 90 tablet 1  . REPATHA SURECLICK 245 MG/ML SOAJ INJECT 140 MG INTO THE SKIN EVERY 14 DAYS 2 mL 6  . triamcinolone cream (KENALOG) 0.1 % Apply 1 application topically 2 (two) times daily. 30 g 0  . triamterene-hydrochlorothiazide (MAXZIDE-25) 37.5-25 MG tablet Take 0.5 tablets daily by mouth. 90 tablet 3  . albuterol (VENTOLIN HFA) 108 (90 Base) MCG/ACT inhaler Inhale 2 puffs into the lungs every 4 (four) hours as needed for wheezing or shortness of breath (cough, shortness of breath or wheezing.). 8 g 11  . Alcohol Swabs (B-D SINGLE USE SWABS REGULAR) PADS     . azithromycin (ZITHROMAX) 250 MG tablet Take two tablets on the first day and then one tablet every day after.    . blood glucose meter kit and supplies KIT Dispense based on patient and insurance preference. Use daily as directed. (FOR ICD-9 250.00, 250.01). 1 each 0  . diclofenac sodium (VOLTAREN) 1 % GEL     . ezetimibe (ZETIA) 10 MG tablet Take 1 tablet (10 mg total) by mouth daily. (Patient not taking: Reported on 01/10/2019) 30 tablet 5  . fluticasone (FLOVENT HFA) 220 MCG/ACT inhaler Inhale 2 puffs into the lungs in the morning and at bedtime. (Patient not taking: Reported on 08/13/2019) 1 Inhaler 12  . Levomefolate Glucosamine (METHYLFOLATE PO) Take by mouth.    . pramipexole (MIRAPEX) 0.125 MG tablet Take 1 tab po 2-3 hours before bedtime, then may increase to 2 tabs po QHS after 4-7 days as tolerated. 60 tablet 2  .  traZODone (DESYREL) 100 MG tablet Take 2 tablets (200 mg total) by mouth at bedtime. 180 tablet 1  . TRUE METRIX BLOOD GLUCOSE TEST test strip     . TRUEPLUS LANCETS 33G MISC     . venlafaxine XR (EFFEXOR-XR) 75 MG 24 hr capsule Take 3 capsules (225 mg total) by mouth daily with breakfast. 270 capsule 1   No current facility-administered medications for this visit.    Medication Side Effects: None  Allergies:  Allergies  Allergen Reactions  . Contrast Media [Iodinated Diagnostic Agents] Anaphylaxis    Anaphylactic shock.  . Iodine Shortness Of Breath  . Adhesive [Tape] Other (See  Comments)    Reaction:  Blisters   . Celecoxib     "I climb the walls"  . Statins     Body aches, constipation AND DEPRESSION  . Ceclor [Cefaclor] Rash  . Moxifloxacin Rash  . Sulfonamide Derivatives Nausea And Vomiting    Past Medical History:  Diagnosis Date  . Arthritis   . Asthma   . Bipolar 1 disorder ([place])   . Bipolar 1 disorder ([place])   . Breast cancer ([place])   . Depression   . Dysrhythmia    palpitations  . Dysrhythmia    atrial fibrillation  . Fibromyalgia   . Finger fracture 2011  . GERD (gastroesophageal reflux disease)   . Heart murmur   . History of stress test 04/28/2011   showed normal perfusion without scar or ischemia  . Hx of echocardiogram 04/28/2011   showed normal systolic function with mild diastolic dysfunction,she had trace MR but did not have frank mitral valve prolapse demonstrated. She had mild pulmonary hypertension with an estimated RV systolic pressure at 34 mm.  . Hyperlipemia   . Meniere disease   . MVP (mitral valve prolapse)   . Neuromuscular disorder ([place])    fibromyalgia    Family History  Problem Relation Age of Onset  . Mitral valve prolapse Mother   . Heart Problems Mother   . Cancer Mother     Social History   Socioeconomic History  . Marital status: Single    Spouse name: Not on file  . Number of children: Not on file  . Years of  education: Not on file  . Highest education level: Not on file  Occupational History  . Not on file  Tobacco Use  . Smoking status: Current Every Day Smoker    Packs/day: 1.50    Years: 40.00    Pack years: 60.00    Types: Cigarettes  . Smokeless tobacco: Never Used  . Tobacco comment: Cannot afford chantix. Working on getting discount from drug compny  Vaping Use  . Vaping Use: Never used  Substance and Sexual Activity  . Alcohol use: Yes    Alcohol/week: 0.0 standard drinks    Comment: occ  . Drug use: No  . Sexual activity: Not on file  Other Topics Concern  . Not on file  Social History Narrative  . Not on file   Social Determinants of Health   Financial Resource Strain:   . Difficulty of Paying Living Expenses:   Food Insecurity:   . Worried About Charity fundraiser in the Last Year:   . Arboriculturist in the Last Year:   Transportation Needs:   . Film/video editor (Medical):   Marland Kitchen Lack of Transportation (Non-Medical):   Physical Activity:   . Days of Exercise per Week:   . Minutes of Exercise per Session:   Stress:   . Feeling of Stress :   Social Connections:   . Frequency of Communication with Friends and Family:   . Frequency of Social Gatherings with Friends and Family:   . Attends Religious Services:   . Active Member of Clubs or Organizations:   . Attends Archivist Meetings:   Marland Kitchen Marital Status:   Intimate Partner Violence:   . Fear of Current or Ex-Partner:   . Emotionally Abused:   Marland Kitchen Physically Abused:   . Sexually Abused:     Past Medical History, Surgical history, Social history, and Family history were reviewed and updated as appropriate.  Please see review of systems for further details on the patient's review from today.   Objective:   Physical Exam:  LMP 06/25/2011   Physical Exam Neurological:     Mental Status: She is alert and oriented to person, place, and time.     Cranial Nerves: No dysarthria.  Psychiatric:         Attention and Perception: Attention and perception normal.        Mood and Affect: Mood normal.        Speech: Speech normal.        Behavior: Behavior is cooperative.        Thought Content: Thought content normal. Thought content is not paranoid or delusional. Thought content does not include homicidal or suicidal ideation. Thought content does not include homicidal or suicidal plan.        Cognition and Memory: Cognition and memory normal.        Judgment: Judgment normal.     Comments: Insight intact     Lab Review:     Component Value Date/Time   NA 140 11/25/2017 1155   NA 138 08/20/2014 1117   K 4.3 11/25/2017 1155   K 4.7 08/20/2014 1117   CL 98 11/25/2017 1155   CL 104 06/29/2012 1221   CO2 27 11/25/2017 1155   CO2 26 08/20/2014 1117   GLUCOSE 84 11/25/2017 1155   GLUCOSE 82 03/13/2016 1720   GLUCOSE 92 08/20/2014 1117   GLUCOSE 107 (H) 06/29/2012 1221   BUN 17 11/25/2017 1155   BUN 16.0 08/20/2014 1117   CREATININE 0.93 11/25/2017 1155   CREATININE 0.9 08/20/2014 1117   CALCIUM 9.8 11/25/2017 1155   CALCIUM 9.4 08/20/2014 1117   PROT 6.7 11/25/2017 1155   PROT 6.6 08/20/2014 1117   ALBUMIN 4.7 11/25/2017 1155   ALBUMIN 3.9 08/20/2014 1117   AST 21 11/25/2017 1155   AST 22 08/20/2014 1117   ALT 21 11/25/2017 1155   ALT 18 08/20/2014 1117   ALKPHOS 109 11/25/2017 1155   ALKPHOS 54 08/20/2014 1117   BILITOT <0.2 11/25/2017 1155   BILITOT 0.47 08/20/2014 1117   GFRNONAA 68 11/25/2017 1155   GFRAA 78 11/25/2017 1155       Component Value Date/Time   WBC 6.4 08/12/2016 1659   WBC 10.6 (H) 03/13/2016 1720   RBC 4.53 08/12/2016 1659   RBC 4.87 03/13/2016 1720   HGB 14.2 08/12/2016 1659   HGB 13.7 08/20/2014 1117   HCT 41.9 08/12/2016 1659   HCT 40.3 08/20/2014 1117   PLT 230 08/12/2016 1659   MCV 93 08/12/2016 1659   MCV 94.1 08/20/2014 1117   MCH 31.3 08/12/2016 1659   MCH 30.6 03/13/2016 1720   MCHC 33.9 08/12/2016 1659   MCHC 34.3 03/13/2016  1720   RDW 14.1 08/12/2016 1659   RDW 13.2 08/20/2014 1117   LYMPHSABS 2.4 03/13/2016 1720   LYMPHSABS 2.0 08/20/2014 1117   MONOABS 0.9 03/13/2016 1720   MONOABS 0.5 08/20/2014 1117   EOSABS 0.0 03/13/2016 1720   EOSABS 0.1 08/20/2014 1117   BASOSABS 0.0 03/13/2016 1720   BASOSABS 0.1 08/20/2014 1117    No results found for: POCLITH, LITHIUM   No results found for: PHENYTOIN, PHENOBARB, VALPROATE, CBMZ   .res Assessment: Plan:   Patient seen for 30 minutes and time spent counseling patient regarding treatment options for RLS.  Patient reports that she has taken gabapentin in the past and this was poorly tolerated. Discussed potential benefits, risks, and  side effects of pramipexole.  Discussed potential for pramipexole to cause some impulsive behaviors and to contact office if this occurs.  Discussed that pramipexole is also used off label for depression at times.  Patient agrees to trial of pramipexole.  Will start pramipexole 0.25 mg 2 to 3 hours before bedtime, then may increase to 2 tabs after 4 to 7 days as tolerated for restless legs. Continue Effexor XR 225 mg daily for anxiety depression. Continue lamotrigine 200 mg 2-1/2 tabs daily for mood stabilization. Continue l-methylfolate 15 mg daily for mood signs and symptoms. Continue trazodone 200 mg at bedtime for insomnia. Patient to follow-up in 6 months or sooner if clinically indicated. Patient advised to contact office with any questions, adverse effects, or acute worsening in signs and symptoms.  Dannette was seen today for other and follow-up.  Diagnoses and all orders for this visit:  RLS (restless legs syndrome) -     pramipexole (MIRAPEX) 0.125 MG tablet; Take 1 tab po 2-3 hours before bedtime, then may increase to 2 tabs po QHS after 4-7 days as tolerated.  Bipolar 1 disorder, mixed, moderate ([facility]) -     venlafaxine XR (EFFEXOR-XR) 75 MG 24 hr capsule; Take 3 capsules (225 mg total) by mouth daily with breakfast. -      L-Methylfolate 15 MG TABS; Take 1 tablet (15 mg total) by mouth daily. -     lamoTRIgine (LAMICTAL) 200 MG tablet; TAKE 1/2 TABLET EVERY MORNING  AND TAKE 2 TABLETS AT BEDTIME  Primary insomnia -     traZODone (DESYREL) 100 MG tablet; Take 2 tablets (200 mg total) by mouth at bedtime.    Please see After Visit Summary for patient specific instructions.  Future Appointments  Date Time Provider [facility]  08/21/2019  3:45 PM Deberah Pelton, NP [facility]  08/27/2019  4:15 PM Melvyn Novas, Christena Deem, MD [facility] None    No orders of the defined types were placed in this encounter.     -------------------------------

## 2019-08-20 NOTE — Progress Notes (Deleted)
Cardiology Clinic Note   Patient Name: Anita Arroyo Date of Encounter: 08/21/2019  Primary Care Provider:  Harrison Mons, [place] Primary Cardiologist:  Shelva Majestic, MD  Patient Profile    Anita Arroyo 59 year old female presents today for follow-up evaluation of her aortic atherosclerosis, hyperlipidemia, palpitations, and CAD.  Past Medical History    Past Medical History:  Diagnosis Date  . Arthritis   . Asthma   . Bipolar 1 disorder ([place])   . Bipolar 1 disorder ([place])   . Breast cancer ([place])   . Depression   . Dysrhythmia    palpitations  . Dysrhythmia    atrial fibrillation  . Fibromyalgia   . Finger fracture 2011  . GERD (gastroesophageal reflux disease)   . Heart murmur   . History of stress test 04/28/2011   showed normal perfusion without scar or ischemia  . Hx of echocardiogram 04/28/2011   showed normal systolic function with mild diastolic dysfunction,she had trace MR but did not have frank mitral valve prolapse demonstrated. She had mild pulmonary hypertension with an estimated RV systolic pressure at 34 mm.  . Hyperlipemia   . Meniere disease   . MVP (mitral valve prolapse)   . Neuromuscular disorder ([place])    fibromyalgia   Past Surgical History:  Procedure Laterality Date  . ANKLE SURGERY     left x4  . BREAST LUMPECTOMY Right 2011  . BREAST SURGERY  2011   lumpectomy right breast   . COLONOSCOPY WITH PROPOFOL N/A 02/25/2014   Procedure: COLONOSCOPY WITH PROPOFOL;  Surgeon: Garlan Fair, MD;  Location: [facility];  Service: Endoscopy;  Laterality: N/A;  . deviated septum    . ECTOPIC PREGNANCY SURGERY    . ELBOW SURGERY     right elbow  . EYE SURGERY     cataract surgery  . fibroid     2-3 fibroid adenomas removed  . JOINT REPLACEMENT  2011   right knee   . KNEE SURGERY     left knee  . SHOULDER ARTHROSCOPY WITH BICEPSTENOTOMY Right 09/25/2015   Procedure: SHOULDER ARTHROSCOPY WITH BICEPSTENOTOMY;  Surgeon: Ninetta Lights, MD;  Location:  [facility];  Service: Orthopedics;  Laterality: Right;  . SHOULDER ARTHROSCOPY WITH DISTAL CLAVICLE RESECTION Right 09/25/2015   Procedure: SHOULDER ARTHROSCOPY WITH DISTAL CLAVICLE RESECTION;  Surgeon: Ninetta Lights, MD;  Location: [facility];  Service: Orthopedics;  Laterality: Right;  . SHOULDER ARTHROSCOPY WITH SUBACROMIAL DECOMPRESSION Right 09/25/2015   Procedure: RIGHT SHOULDER ARTHROSCOPY DEBRIDEMENT,ACROMIOPLASTY,DISTAL CLAVICAL EXCISION, RELEASE OF BICEPS TENDON;  Surgeon: Ninetta Lights, MD;  Location: [facility];  Service: Orthopedics;  Laterality: Right;  . TMJ ARTHROPLASTY    . TONSILLECTOMY    . TOTAL SHOULDER ARTHROPLASTY Left 01/05/2016  . UTERINE FIBROID SURGERY     polups and fibroids removed   . WRIST SURGERY     left    Allergies  Allergies  Allergen Reactions  . Contrast Media [Iodinated Diagnostic Agents] Anaphylaxis    Anaphylactic shock.  . Iodine Shortness Of Breath  . Adhesive [Tape] Other (See Comments)    Reaction:  Blisters   . Celecoxib     "I climb the walls"  . Statins     Body aches, constipation AND DEPRESSION  . Ceclor [Cefaclor] Rash  . Moxifloxacin Rash  . Sulfonamide Derivatives Nausea And Vomiting    History of Present Illness    ***  Home Medications    Prior to  Admission medications   Medication Sig Start Date End Date Taking? Authorizing Provider  albuterol (VENTOLIN HFA) 108 (90 Base) MCG/ACT inhaler Inhale 2 puffs into the lungs every 4 (four) hours as needed for wheezing or shortness of breath (cough, shortness of breath or wheezing.). 02/01/19   Tanda Rockers, MD  Alcohol Swabs (B-D SINGLE USE SWABS REGULAR) PADS  12/31/16   [provider]  atenolol (TENORMIN) 50 MG tablet Take 50 mg in the morning and 25 mg in the evening 04/27/17   Troy Sine, MD  azithromycin (ZITHROMAX) 250 MG tablet Take two tablets on the first day and then one tablet every day after. 02/09/19    [provider]  Biotin (BIOTIN 5000) 5 MG CAPS Take 5 mg by mouth daily.    [provider]  blood glucose meter kit and supplies KIT Dispense based on patient and insurance preference. Use daily as directed. (FOR ICD-9 250.00, 250.01). 11/30/16   Harrison Mons, PA  budesonide-formoterol (SYMBICORT) 160-4.5 MCG/ACT inhaler Inhale 2 puffs into the lungs 2 (two) times daily. 02/01/19   Tanda Rockers, MD  Calcium-Magnesium-Vitamin D (CALCIUM 1200+D3 PO) Take by mouth.    [provider]  cetirizine (ZYRTEC) 10 MG tablet Take 10 mg by mouth daily.     [provider]  diclofenac sodium (VOLTAREN) 1 % GEL  01/26/17   [provider]  Evolocumab (REPATHA SURECLICK) 751 MG/ML SOAJ  02/05/19   [provider]  ezetimibe (ZETIA) 10 MG tablet Take 1 tablet (10 mg total) by mouth daily. Patient not taking: Reported on 01/10/2019 02/09/18 05/10/18  Troy Sine, MD  fluticasone [facility]) 50 MCG/ACT nasal spray USE 1 SPRAY IN EACH NOSTRIL TWICE DAILY 11/16/16   Harrison Mons, PA  fluticasone (FLOVENT HFA) 220 MCG/ACT inhaler Inhale 2 puffs into the lungs in the morning and at bedtime. Patient not taking: Reported on 08/13/2019 06/21/19   Tanda Rockers, MD  HYDROcodone-acetaminophen (NORCO/VICODIN) 5-325 MG tablet take 1 TABLET BY MOUTH EVERY 6 HOURS AS NEEDED for moderate pain 07/27/17   Harrison Mons, PA  L-Methylfolate 15 MG TABS Take 1 tablet (15 mg total) by mouth daily. 08/13/19   Thayer Headings, PMHNP  lamoTRIgine (LAMICTAL) 200 MG tablet TAKE 1/2 TABLET EVERY MORNING  AND TAKE 2 TABLETS AT BEDTIME 08/13/19   Thayer Headings, PMHNP  Levomefolate Glucosamine (METHYLFOLATE PO) Take by mouth.    [provider]  Magnesium 250 MG TABS Take 250 mg by mouth daily.    [provider]  meclizine (ANTIVERT) 25 MG tablet Take 1 tablet (25 mg total) by mouth as needed for dizziness. 11/10/16   Troy Sine, MD  meloxicam (MOBIC) 15 MG  tablet TAKE 1 TABLET BY MOUTH ONCE A DAY AS NEEDED INFLAMMATION 01/12/19   [provider]  methocarbamol (ROBAXIN) 500 MG tablet TAKE 1 TABLET BY MOUTH EVERY 6 TO 8 HOURS AS NEEDED FOR SPASMS/MUSCLE TENSION 02/03/19   [provider]  montelukast (SINGULAIR) 10 MG tablet Take 1 tablet (10 mg total) at bedtime by mouth. 11/30/16   Harrison Mons, PA  pantoprazole (PROTONIX) 40 MG tablet Take 1 tablet (40 mg total) by mouth daily. 04/26/17   Harrison Mons, PA  pramipexole (MIRAPEX) 0.125 MG tablet Take 1 tab po 2-3 hours before bedtime, then may increase to 2 tabs po QHS after 4-7 days as tolerated. 08/13/19   Thayer Headings, PMHNP  REPATHA SURECLICK 025 MG/ML SOAJ INJECT 140 MG INTO THE SKIN EVERY  14 DAYS 06/29/19   Troy Sine, MD  traZODone (DESYREL) 100 MG tablet Take 2 tablets (200 mg total) by mouth at bedtime. 08/13/19 11/11/19  Thayer Headings, PMHNP  triamcinolone cream (KENALOG) 0.1 % Apply 1 application topically 2 (two) times daily. 04/19/18   Kennyth Arnold, FNP  triamterene-hydrochlorothiazide (MAXZIDE-25) 37.5-25 MG tablet Take 0.5 tablets daily by mouth. 11/30/16   Harrison Mons, PA  TRUE METRIX BLOOD GLUCOSE TEST test strip  12/31/16   [provider]  TRUEPLUS LANCETS 33G [place]  12/31/16   [provider]  venlafaxine XR (EFFEXOR-XR) 75 MG 24 hr capsule Take 3 capsules (225 mg total) by mouth daily with breakfast. 08/13/19 11/11/19  Thayer Headings, PMHNP    Family History    Family History  Problem Relation Age of Onset  . Mitral valve prolapse Mother   . Heart Problems Mother   . Cancer Mother    She indicated that her mother is alive. She indicated that her maternal grandmother is deceased. She indicated that her maternal grandfather is deceased. She indicated that her paternal grandmother is deceased. She indicated that her paternal grandfather is deceased.  Social History    Social History   Socioeconomic History  . Marital status:  Single    Spouse name: Not on file  . Number of children: Not on file  . Years of education: Not on file  . Highest education level: Not on file  Occupational History  . Not on file  Tobacco Use  . Smoking status: Current Every Day Smoker    Packs/day: 1.50    Years: 40.00    Pack years: 60.00    Types: Cigarettes  . Smokeless tobacco: Never Used  . Tobacco comment: Cannot afford chantix. Working on getting discount from drug compny  Vaping Use  . Vaping Use: Never used  Substance and Sexual Activity  . Alcohol use: Yes    Alcohol/week: 0.0 standard drinks    Comment: occ  . Drug use: No  . Sexual activity: Not on file  Other Topics Concern  . Not on file  Social History Narrative  . Not on file   Social Determinants of Health   Financial Resource Strain:   . Difficulty of Paying Living Expenses:   Food Insecurity:   . Worried About Charity fundraiser in the Last Year:   . Arboriculturist in the Last Year:   Transportation Needs:   . Film/video editor (Medical):   Marland Kitchen Lack of Transportation (Non-Medical):   Physical Activity:   . Days of Exercise per Week:   . Minutes of Exercise per Session:   Stress:   . Feeling of Stress :   Social Connections:   . Frequency of Communication with Friends and Family:   . Frequency of Social Gatherings with Friends and Family:   . Attends Religious Services:   . Active Member of Clubs or Organizations:   . Attends Archivist Meetings:   Marland Kitchen Marital Status:   Intimate Partner Violence:   . Fear of Current or Ex-Partner:   . Emotionally Abused:   Marland Kitchen Physically Abused:   . Sexually Abused:      Review of Systems    General:  No chills, fever, night sweats or weight changes.  Cardiovascular:  No chest pain, dyspnea on exertion, edema, orthopnea, palpitations, paroxysmal nocturnal dyspnea. Dermatological: No rash, lesions/masses Respiratory: No cough, dyspnea Urologic: No hematuria, dysuria Abdominal:   No  nausea, vomiting,  diarrhea, bright red blood per rectum, melena, or hematemesis Neurologic:  No visual changes, wkns, changes in mental status. All other systems reviewed and are otherwise negative except as noted above.  Physical Exam    VS:  LMP 06/25/2011  , BMI There is no height or weight on file to calculate BMI. GEN: Well nourished, well developed, in no acute distress. HEENT: normal. Neck: Supple, no JVD, carotid bruits, or masses. Cardiac: RRR, no murmurs, rubs, or gallops. No clubbing, cyanosis, edema.  Radials/DP/PT 2+ and equal bilaterally.  Respiratory:  Respirations regular and unlabored, clear to auscultation bilaterally. GI: Soft, nontender, nondistended, BS + x 4. MS: no deformity or atrophy. Skin: warm and dry, no rash. Neuro:  Strength and sensation are intact. Psych: Normal affect.  Accessory Clinical Findings    Recent Labs: No results found for requested labs within last 8760 hours.   Recent Lipid Panel    Component Value Date/Time   CHOL 235 (H) 11/25/2017 1155   TRIG 113 11/25/2017 1155   HDL 70 11/25/2017 1155   CHOLHDL 3.4 11/25/2017 1155   CHOLHDL 2.9 11/22/2013 0904   VLDL 16 11/22/2013 0904   LDLCALC 142 (H) 11/25/2017 1155    ECG personally reviewed by me today- *** - No acute changes  Assessment & Plan   1.  ***   Jossie Ng. Hobart Marte NP-C    08/21/2019, 8:00 AM [address] Office [phone] Fax [phone]

## 2019-08-21 ENCOUNTER — Ambulatory Visit: Payer: Medicare HMO | Admitting: General Practice

## 2019-08-21 ENCOUNTER — Telehealth: Payer: Self-pay | Admitting: Internal Medicine

## 2019-08-21 NOTE — Telephone Encounter (Signed)
LMTCB x 1 

## 2019-08-22 MED ORDER — AIRDUO DIGIHALER 113-14 MCG/ACT IN AEPB: 2.0000 | INHALATION_SPRAY | Freq: Two times a day (BID) | RESPIRATORY_TRACT | 3 refills | Status: DC

## 2019-08-22 NOTE — Telephone Encounter (Signed)
Pt returning call.  [phone].  May leave detailed message.

## 2019-08-22 NOTE — Telephone Encounter (Signed)
Called and spoke with pt. Pt stated that she needs the generic form of airduo inhaler to be sent to [facility].  Previous encounter from 07/17/19 stated for pt to be prescribed advair hfa 115.  Dr. Melvyn Novas, please advise on dose for airduo inhaler if you are okay with [place] sending this Rx to pharmacy for pt. Pt does have an OV scheduled with you 8/19.

## 2019-08-22 NOTE — Telephone Encounter (Signed)
airduo 113  Take 2 puffs first thing in am and then another 2 puffs about 12 hours later.

## 2019-08-22 NOTE — Telephone Encounter (Signed)
Rx has been sent to pharmacy for pt. Nothing further needed.

## 2019-08-24 ENCOUNTER — Telehealth: Payer: Self-pay | Admitting: Cardiovascular Disease

## 2019-08-24 NOTE — Telephone Encounter (Signed)
Pt c/o of Chest Pain: STAT if CP now or developed within 24 hours  1. Are you having CP right now? Pt states she has chest tightness/pressure not chest pain.  It feels like someone is pressing on her chest   2. Are you experiencing any other symptoms (ex. SOB, nausea, vomiting, sweating)? Nausea, Dizziness,  Headache/migraine. Pt thought they were from the heat   3. How long have you been experiencing CP? ~5 days  4. Is your CP continuous or coming and going? continuous  5. Have you taken Nitroglycerin? No   Pt is sensitive to heat and thought a lot of her sx have been related to heat but she is starting to get nervous. She has high cholesterol in the past but the repatha has seemed to be helping   Pt is nervous and would like to know what to do, especially because it is so long before she can get an appointment to see anyone

## 2019-08-24 NOTE — Telephone Encounter (Addendum)
Spoke with patient and she has been having chest discomfort "off and on" for several days, currently having Nausea for 3-4 days, per patient not uncommon for her since she is heat intolerant  Headache, dizziness, and lightheaded at times May have increased shortness of breath but she is not sure Advised patient if she was currently having discomfort she needed to go to ED  Patient did not want to go to ED, discussed with Dr Claiborne Billings and he suggested same  Advised patient, verbalized understanding.

## 2019-08-27 ENCOUNTER — Ambulatory Visit: Payer: Medicare HMO | Admitting: Internal Medicine

## 2019-08-31 ENCOUNTER — Telehealth: Payer: Self-pay | Admitting: Internal Medicine

## 2019-09-03 ENCOUNTER — Telehealth: Payer: Self-pay | Admitting: Internal Medicine

## 2019-09-03 NOTE — Telephone Encounter (Signed)
lmtcb

## 2019-09-03 NOTE — Telephone Encounter (Signed)
This is not the same as airduo and certainly not the same device Send to amber in pharmacy to see what is the best substitute for airduo and or make appt for pt to bring formulary in so we can pick a suitable substitute

## 2019-09-03 NOTE — Telephone Encounter (Signed)
Amber, Can you please advise what is comparable to the airduo digahaler?  Thank you.

## 2019-09-03 NOTE — Telephone Encounter (Signed)
Error

## 2019-09-03 NOTE — Telephone Encounter (Signed)
Received a message from [place] regarding Airduo digihaler not being covered, but Fluticasone respiclick inhaler is covered.  Dr. Melvyn Novas, Please advise.  Thank you

## 2019-09-03 NOTE — Telephone Encounter (Signed)
Fluticasone/salmeterol respiclick is the generic for airduo respiclick device.  Airduo respiclick is a DPI as well just doesn't have the technology for monitoring like the digihaler.  I would recommend trying the generic for airduo respiclick.  Also, the generic for airduo respiclick is $20 for a 23-XID supply at [facility] with goodrx coupon. Thanks!  Museum/gallery conservator

## 2019-09-04 NOTE — Telephone Encounter (Signed)
My preference is to have her return as she was due in June according to my last note  See if we have breo 100 we can give her until she can come in   If not available, start airduo respiclick pas per Amber's recs  But just at one bid dosing for now and have her bring all meds to ov esp her inhalers

## 2019-09-04 NOTE — Telephone Encounter (Signed)
Spoke with patient, she has 75 puffs of her Symbicort left and would prefer to use this until her appointment on 09/13/19.  Explained about Breo and using the good [facility] card[facility]app [facility].  She will discuss further with Dr. Melvyn Novas when she comes in for her appointment.  Nothing further needed.

## 2019-09-04 NOTE — Telephone Encounter (Signed)
Just an FYI:  I spoke with Anita Arroyo with patient's insurance company.  I was advised that the Breo 100 is covered as well as Advair, Symbicort 160-4.5, wixilla and Fluticasone all strengths.  I let them know that patient will be in the office for an appointment on 8/19 and the patient will discuss her options in detail with Dr. Melvyn Novas and make a decision at that time.  Nothing further needed.

## 2019-09-13 ENCOUNTER — Ambulatory Visit: Payer: Medicare HMO | Admitting: Internal Medicine

## 2019-09-20 ENCOUNTER — Ambulatory Visit: Payer: Medicare HMO | Admitting: Pulmonary Disease

## 2019-09-24 ENCOUNTER — Telehealth: Payer: Self-pay | Admitting: Pulmonary Disease

## 2019-09-24 NOTE — Telephone Encounter (Signed)
Spoke with patient regarding changing her appt to a telephone visit . Patient voice was understanding. Nothing else further needed.

## 2019-09-25 ENCOUNTER — Ambulatory Visit (INDEPENDENT_AMBULATORY_CARE_PROVIDER_SITE_OTHER): Payer: Medicare HMO | Admitting: Pulmonary Disease

## 2019-09-25 ENCOUNTER — Other Ambulatory Visit: Payer: Self-pay

## 2019-09-25 ENCOUNTER — Encounter: Payer: Self-pay | Admitting: Pulmonary Disease

## 2019-09-25 DIAGNOSIS — Z79899 Other long term (current) drug therapy: Secondary | ICD-10-CM

## 2019-09-25 DIAGNOSIS — F17210 Nicotine dependence, cigarettes, uncomplicated: Secondary | ICD-10-CM

## 2019-09-25 DIAGNOSIS — J449 Chronic obstructive pulmonary disease, unspecified: Secondary | ICD-10-CM | POA: Diagnosis not present

## 2019-09-25 MED ORDER — FLUTICASONE-SALMETEROL 113-14 MCG/ACT IN AEPB: 1.0000 | INHALATION_SPRAY | Freq: Two times a day (BID) | RESPIRATORY_TRACT | 3 refills | Status: DC

## 2019-09-25 NOTE — Assessment & Plan Note (Signed)
Plan: Emphasized need to stop smoking Recommend seasonal flu vaccine 8-week follow-up in office with Dr. Melvyn Novas Recommending pharmacy team visit to review smoking cessation

## 2019-09-25 NOTE — Progress Notes (Signed)
Virtual Visit via Telephone Note  I connected with [name] on 09/25/19 at 12:00 PM EDT by telephone and verified that I am speaking with the correct person using two identifiers.  Location: Patient: Home Provider: [facility] - [address], [place], [place], [place] [address]   I discussed the limitations, risks, security and privacy concerns of performing an evaluation and management service by telephone and the availability of in person appointments. I also discussed with the patient that there may be a patient responsible charge related to this service. The patient expressed understanding and agreed to proceed.  Patient consented to consult via telephone: Yes People present and their role in pt care: Pt   History of Present Illness:  59 year old female current everyday smoker followed in our office for COPD  Past medical history: Hyperlipidemia, depression, hypertension, GERD, fibromyalgia, chronic pain syndrome Smoking history: Current smoker.  Smoking 1.5 packs/day.  60-pack-year smoking history Maintenance: Symbicort 160 Patient of Dr. Melvyn Novas  Chief complaint:    59 year old female current everyday smoker followed in our office for COPD.  She is followed by Dr. Melvyn Novas, patient was last seen in December 2020.  This was a televisit.  Patient contacted our office a few months ago there have been multiple messages regarding her inhaler coverage as well as her inhaler that she should be maintained on.  [employer] insurance is recommending a different inhaler.  She was recommended to have a follow-up with our office to further discuss.  This office visit was scheduled.  Patient's visit was then changed to a virtual visit due to the increase in COVID-19 numbers.  Patient is reporting she having difficulty affording her inhalers.  Her insurance company is recommended the air duo inhaler.  She reports that she was contacted from her insurance company and they informed her what  inhaler would be covered.  Unfortunately she lost this information.  Patient is also reporting that now she is in the donut hole/coverage.  She feels this is due to her Repatha.  Our pharmacy team has reviewed her chart per previous documentation they recommended the AirDuo RespiClick.  A prescription was put in for this but unfortunately it was put in as the diginhaler.  Patient is requesting the AirDuo RespiClick prescription today.  It was emphasized that she would need to use a good Rx coupon with this prescription.  She is requesting this to be mailed to her.  She has received her COVID-19 vaccines in April and May/2021.  She has not yet applied for patient assistance through [place] for Symbicort 160 to see if she would qualify given the fact that she is in the donut hole.  Observations/Objective:  Social History   Tobacco Use  Smoking Status Current Every Day Smoker  . Packs/day: 1.50  . Years: 40.00  . Pack years: 60.00  . Types: Cigarettes  Smokeless Tobacco Never Used  Tobacco Comment   Cannot afford chantix. Working on getting discount from drug compny   Immunization History  Administered Date(s) Administered  . Influenza,inj,Quad PF,6+ Mos 10/12/2016, 10/25/2017  . Pneumococcal Polysaccharide-23 11/25/2005  . Pneumococcal-Unspecified 11/25/2005  . Rabies, IM 03/13/2016, 03/16/2016, 03/20/2016, 03/27/2016  . Tdap 03/13/2016      Assessment and Plan:  COPD (chronic obstructive pulmonary disease) ([place]) Plan: Stop Symbicort 160 Start AirDuo RespiClick 160 Printed prescription of air duo and printed good Rx coupon mailed to patient We will have patient set up pharmacy team visit for in person medication review as  well as patient assistance application 8-week follow-up with Dr. Melvyn Novas in person  Medication management Patient currently in coverage/donut hole She has not applied for patient assistance program through [place] for Symbicort coverage Previous  pharmacy notes recommend air duo respiclick 599, with good Rx coupon) to prescription  Plan: Will mail air duo prescription today with good Rx coupons printed We will also have patient set up pharmacy team visit to help with patient assistance application through [employer]  Cigarette smoker Plan: Emphasized need to stop smoking Recommend seasonal flu vaccine 8-week follow-up in office with Dr. Melvyn Novas Recommending pharmacy team visit to review smoking cessation   Follow Up Instructions:  Return in about 2 months (around 11/25/2019), or if symptoms worsen or fail to improve, for Follow up with Dr. Melvyn Novas.   I discussed the assessment and treatment plan with the patient. The patient was provided an opportunity to ask questions and all were answered. The patient agreed with the plan and demonstrated an understanding of the instructions.   The patient was advised to call back or seek an in-person evaluation if the symptoms worsen or if the condition fails to improve as anticipated.  I provided 25 minutes of non-face-to-face time during this encounter.   Lauraine Rinne, NP

## 2019-09-25 NOTE — Patient Instructions (Addendum)
You were seen today by Lauraine Rinne, NP  for:   1. Chronic obstructive pulmonary disease, unspecified COPD type ([place])  Stop Symbicort 160 when you run out  Start air duo 749 respiclick 1 puff every 12 hours, rinse mouth out after use  We recommend the seasonal flu vaccine   2. Cigarette smoker  We recommend that you stop smoking.  >>>You need to set a quit date >>>If you have friends or family who smoke, let them know you are trying to quit and not to smoke around you or in your living environment  Smoking Cessation Resources:  1 800 QUIT NOW  >>> Patient to call this resource and utilize it to help support her quit smoking >>> Keep up your hard work with stopping smoking  You can also contact the [facility] >>>For smoking cessation classes call [phone]  We do not recommend using e-cigarettes as a form of stopping smoking  You can sign up for smoking cessation support texts and information:  >>>https://smokefree.gov/smokefreetxt    3. Medication management  Air duo prescription printed and mailed to patient today Good Rx coupons printed and mailed to patient today Patient needs to have an office visit with pharmacy team to see if they can assist with patient applying for patient assistance program  Please present to our office in 2 weeks for an appointment with the clinical pharmacy team for:  . Smoking cessation . Medication Management  . Medication reconciliation  . Medication Access -ICS/LABA . Inhaler teaching      Follow Up:    Return in about 2 months (around 11/25/2019), or if symptoms worsen or fail to improve, for Follow up with Dr. Melvyn Novas.   Please do your part to reduce the spread of COVID-19:      Reduce your risk of any infection  and COVID19 by using the similar precautions used for avoiding the common cold or flu:  Marland Kitchen Wash your hands often with soap and warm water for at least 20 seconds.  If soap and water are not readily  available, use an alcohol-based hand sanitizer with at least 60% alcohol.  . If coughing or sneezing, cover your mouth and nose by coughing or sneezing into the elbow areas of your shirt or coat, into a tissue or into your sleeve (not your hands). Langley Gauss A MASK when in public  . Avoid shaking hands with others and consider head nods or verbal greetings only. . Avoid touching your eyes, nose, or mouth with unwashed hands.  . Avoid close contact with people who are sick. . Avoid places or events with large numbers of people in one location, like concerts or sporting events. . If you have some symptoms but not all symptoms, continue to monitor at home and seek medical attention if your symptoms worsen. . If you are having a medical emergency, call 911.   [place] / e-Visit: [email or web address]         [facility]: [facility]: [phone]                   [facility]: [phone]     It is flu season:   >>> Best ways to protect herself from the flu: Receive the yearly flu vaccine, practice good hand hygiene washing with soap and also using hand sanitizer when available, eat a nutritious meals, get adequate rest, hydrate appropriately  Please contact the office if your symptoms worsen or you have concerns that you are not improving.   Thank you for choosing [facility] for your healthcare, and for allowing [place] to partner with you on your healthcare journey. I am thankful to be able to provide care to you today.   Wyn Quaker FNP-C

## 2019-09-25 NOTE — Assessment & Plan Note (Signed)
Patient currently in coverage/donut hole She has not applied for patient assistance program through [employer] for Symbicort coverage Previous pharmacy notes recommend air duo respiclick 270, with good Rx coupon) to prescription  Plan: Will mail air duo prescription today with good Rx coupons printed We will also have patient set up pharmacy team visit to help with patient assistance application through [employer]

## 2019-09-25 NOTE — Assessment & Plan Note (Signed)
Plan: Stop Symbicort 160 Start AirDuo RespiClick 552 Printed prescription of air duo and printed good Rx coupon mailed to patient We will have patient set up pharmacy team visit for in person medication review as well as patient assistance application 8-week follow-up with Dr. Melvyn Novas in person

## 2019-10-02 ENCOUNTER — Telehealth: Payer: Self-pay | Admitting: Pulmonary Disease

## 2019-10-02 DIAGNOSIS — Z79899 Other long term (current) drug therapy: Secondary | ICD-10-CM

## 2019-10-02 DIAGNOSIS — J449 Chronic obstructive pulmonary disease, unspecified: Secondary | ICD-10-CM

## 2019-10-02 MED ORDER — FLUTICASONE-SALMETEROL 113-14 MCG/ACT IN AEPB: 1.0000 | INHALATION_SPRAY | Freq: Two times a day (BID) | RESPIRATORY_TRACT | 3 refills | Status: DC

## 2019-10-02 NOTE — Telephone Encounter (Signed)
ATC Patient.  LM to call back.   Airduo prescription was sent to [facility] mail order pharmacy at 09/25/19 OV by Brian,NP.

## 2019-10-02 NOTE — Telephone Encounter (Signed)
Pt returning call.  [phone]

## 2019-10-02 NOTE — Telephone Encounter (Signed)
Patient states she received the good Rx cards but no RX so an RX was sent to pharmacy of choice per Wyn Quaker FNP.  Nothing further needed at this time.

## 2019-10-08 DIAGNOSIS — Z7189 Other specified counseling: Secondary | ICD-10-CM | POA: Insufficient documentation

## 2019-10-08 NOTE — Progress Notes (Deleted)
HPI Patient presents today to [facility] referred by Wyn Quaker, NP to see pharmacy team for inhaler patient assistance.  Past medical history includes COPD, HTN, GERD, HLD, depression, tobacco smoker.  Insurance: [employer] - recommends AirDuo RespiClick w/GoodRX coupon - $34 for a 30-day supply at [facility] with goodrx coupon - in donut hole -09/03/19 telephone note >> insurance does cover Symbicort??  AZ&Me application for Symbicort 160-4.5 mcg - 2 puffs BID - insurance prefers AirDuo  Update per Rachael -Airduo is non-formulary  -Symbicort Copay - $85 -- in donut hole    Respiratory Medications Current: AirDuo 113/14 mcg 1 puff BID, montelukast 10 mg daily, albuterol PRN  Tried in past: *** Patient reports {Adherence challenges yes [id number]::"VPXTGGYIR challenges","no known adherence challenges"}  OBJECTIVE Allergies  Allergen Reactions  . Contrast Media [Iodinated Diagnostic Agents] Anaphylaxis    Anaphylactic shock.  . Iodine Shortness Of Breath  . Adhesive [Tape] Other (See Comments)    Reaction:  Blisters   . Celecoxib     "I climb the walls"  . Statins     Body aches, constipation AND DEPRESSION  . Ceclor [Cefaclor] Rash  . Moxifloxacin Rash  . Sulfonamide Derivatives Nausea And Vomiting    Outpatient Encounter Medications as of 10/08/2019  Medication Sig  . albuterol (VENTOLIN HFA) 108 (90 Base) MCG/ACT inhaler Inhale 2 puffs into the lungs every 4 (four) hours as needed for wheezing or shortness of breath (cough, shortness of breath or wheezing.).  Marland Kitchen Alcohol Swabs (B-D SINGLE USE SWABS REGULAR) PADS   . atenolol (TENORMIN) 50 MG tablet Take 50 mg in the morning and 25 mg in the evening  . azithromycin (ZITHROMAX) 250 MG tablet Take two tablets on the first day and then one tablet every day after.  . Biotin (BIOTIN 5000) 5 MG CAPS Take 5 mg by mouth daily.  . blood glucose meter kit and supplies KIT Dispense based on patient and insurance preference. Use  daily as directed. (FOR ICD-9 250.00, 250.01).  . Calcium-Magnesium-Vitamin D (CALCIUM 1200+D3 PO) Take by mouth.  . cetirizine (ZYRTEC) 10 MG tablet Take 10 mg by mouth daily.   . diclofenac sodium (VOLTAREN) 1 % GEL   . Evolocumab (REPATHA SURECLICK) 485 MG/ML SOAJ   . ezetimibe (ZETIA) 10 MG tablet Take 1 tablet (10 mg total) by mouth daily. (Patient not taking: Reported on 01/10/2019)  . fluticasone (FLONASE) 50 MCG/ACT nasal spray USE 1 SPRAY IN EACH NOSTRIL TWICE DAILY  . Fluticasone-Salmeterol (AIRDUO RESPICLICK 462/70) 350-09 MCG/ACT AEPB Inhale 1 puff into the lungs 2 (two) times daily.  Marland Kitchen HYDROcodone-acetaminophen (NORCO/VICODIN) 5-325 MG tablet take 1 TABLET BY MOUTH EVERY 6 HOURS AS NEEDED for moderate pain  . L-Methylfolate 15 MG TABS Take 1 tablet (15 mg total) by mouth daily.  Marland Kitchen lamoTRIgine (LAMICTAL) 200 MG tablet TAKE 1/2 TABLET EVERY MORNING  AND TAKE 2 TABLETS AT BEDTIME  . Levomefolate Glucosamine (METHYLFOLATE PO) Take by mouth.  . Magnesium 250 MG TABS Take 250 mg by mouth daily.  . meclizine (ANTIVERT) 25 MG tablet Take 1 tablet (25 mg total) by mouth as needed for dizziness.  . meloxicam (MOBIC) 15 MG tablet TAKE 1 TABLET BY MOUTH ONCE A DAY AS NEEDED INFLAMMATION  . methocarbamol (ROBAXIN) 500 MG tablet TAKE 1 TABLET BY MOUTH EVERY 6 TO 8 HOURS AS NEEDED FOR SPASMS/MUSCLE TENSION  . montelukast (SINGULAIR) 10 MG tablet Take 1 tablet (10 mg total) at bedtime by mouth.  . pantoprazole (PROTONIX) 40 MG  tablet Take 1 tablet (40 mg total) by mouth daily.  . pramipexole (MIRAPEX) 0.125 MG tablet Take 1 tab po 2-3 hours before bedtime, then may increase to 2 tabs po QHS after 4-7 days as tolerated.  Marland Kitchen REPATHA SURECLICK 878 MG/ML SOAJ INJECT 140 MG INTO THE SKIN EVERY 14 DAYS  . traZODone (DESYREL) 100 MG tablet Take 2 tablets (200 mg total) by mouth at bedtime.  . triamcinolone cream (KENALOG) 0.1 % Apply 1 application topically 2 (two) times daily.  Marland Kitchen  triamterene-hydrochlorothiazide (MAXZIDE-25) 37.5-25 MG tablet Take 0.5 tablets daily by mouth.  . TRUE METRIX BLOOD GLUCOSE TEST test strip   . TRUEPLUS LANCETS 33G MISC   . venlafaxine XR (EFFEXOR-XR) 75 MG 24 hr capsule Take 3 capsules (225 mg total) by mouth daily with breakfast.   No facility-administered encounter medications on file as of 10/08/2019.     Immunization History  Administered Date(s) Administered  . Influenza,inj,Quad PF,6+ Mos 10/12/2016, 10/25/2017  . Pneumococcal Polysaccharide-23 11/25/2005  . Pneumococcal-Unspecified 11/25/2005  . Rabies, IM 03/13/2016, 03/16/2016, 03/20/2016, 03/27/2016  . Tdap 03/13/2016     PFTs PFT Results Latest Ref Rng & Units 02/13/2018  FVC-Pre L 2.65  FVC-Predicted Pre % 83  FVC-Post L 2.77  FVC-Predicted Post % 87  Pre FEV1/FVC % % 85  Post FEV1/FCV % % 86  FEV1-Pre L 2.26  FEV1-Predicted Pre % 91  FEV1-Post L 2.40  DLCO uncorrected ml/min/mmHg 16.59  DLCO UNC% % 74  DLVA Predicted % 82     Assessment   1. Symbicort Patient Assistance  Hold upright  1. Prime inhaler - shake for 5 secs and press down away from you to release test spray, you will do this twice - Will only need to do one time, unless it has been unused for more than 7 days  2. Every time you use it, shake for 5 secs first  3. Breathe out fully away from inhaler 4. Then place mouthpiece into your mouth 5. Breathe in deeply and slowly as you press down on the cannister 6. Hold breath for 10 seconds 7. Then breathe out away from the inhaler  8. Wait 30 secs - And repeat again for your second puff  AirDuo - Does not need to be primed. . Hold the inhaler upright and open the yellow cap all the way until it "clicks" . Each time you open the yellow cap and it "clicks", 1 dose of AirDuo RespiClick is ready to be inhaled . For the correct use of AirDuo RespiClick, hold the inhaler upright as you open the yellow cap . Do not open the yellow cap until you are  ready to take a dose of AirDuo RespiClick . Breathe out fully away from inhaler . Put the mouthpiece in your mouth and close your lips tightly around it . Do not block the vent above the mouthpiece with your lips or fingers . Breathe in quickly and deeply through your mouth  . Remove the inhaler from your mouth . Hold your breath for about 10 seconds or for as long as you comfortably can . Your AirDuo RespiClick inhaler delivers your dose of medicine as a very fine powder that you may or may not taste or feel. Do not take an extra dose from the inhaler even if you do not taste or feel the medicine . Make sure you close the yellow cap after each inhalation so that the inhaler will be ready for your next dose . Rinse your  mouth with water without swallowing after each inhalation  Demonstrated proper inhaler technique using Symbicort demo inhaler.  Patient able to demonstrate proper inhaler technique using teach back method. Instructed patient to rinse mouth with water after using in order to prevent fungal infection.  Patient verbalized understanding.  2. Medication Reconciliation  A drug regimen assessment was performed, including review of allergies, interactions, disease-state management, dosing and immunization history. Medications were reviewed with the patient, including name, instructions, indication, goals of therapy, potential side effects, importance of adherence, and safe use.  Drug interaction(s): no major interactions found  3. Immunizations  Patient is indicated for the influenzae and shingles vaccinations.  PLAN 1. Discontinue AirDuo RespiClick once patient has received Symbicort via patient assistance program 2. Fax patient assistance application to AZ&ME for approval  3. Once received, start Symbicort 160-4.5 mcg 2 puffs twice daily   All questions encouraged and answered.  Instructed patient to reach out with any further questions or concerns.  Thank you for allowing  pharmacy to participate in this patient's care.  This appointment required 60 minutes of patient care (this includes precharting, chart review, review of results, face-to-face care, etc.).  Lorel Monaco, PharmD PGY2 Ambulatory Care Resident [place]

## 2019-10-08 NOTE — Patient Instructions (Signed)
Thank you for meeting with the pharmacy team today!  Below find a summary of what we discussed at your visit:  CONTINUE AirDuo 1 puff twice daily  We will apply for Symbicort patient assistance  START Symbicort 2 puffs twice a day with inhaler once approved.  CALL [phone] for status updates for assistance.       Call [phone] with any questions or concerns.   Lorel Monaco, PharmD PGY2 Ambulatory Care Resident [place]

## 2019-10-12 ENCOUNTER — Ambulatory Visit: Payer: Medicare HMO | Admitting: Physician Assistant

## 2019-10-12 NOTE — Progress Notes (Deleted)
{Choose 1 Note Type (Video or Telephone):[phone]}    Date:  10/12/2019   ID:  Anita Arroyo, DOB Jun 12, 1960, MRN [id number] The patient was identified using 2 identifiers.  {Patient Location:[phone]::"Home"} {Provider [phone]::"Home Office"}  PCP:  Harrison Mons, PA  Cardiologist:  Shelva Majestic, MD  Electrophysiologist:  None   Evaluation Performed:  {Choose Visit FKCL:[id number]::"BSWHQP-RF Visit"}  Chief Complaint:  ***  History of Present Illness:    Anita Arroyo is a 59 y.o. female with hypertension, tobacco use, chronic diastolic heart failure, and mild pulmonary hypertension, HLD, breast cancer on tamoxifen, bipolar disorder, and prior hip replacement. Nuclear stress test in 2013 for chest pain was nonischemic. She takes dyazide for meniere's disease. She take atenolol for palpitations. Screening CT in 2018 showed coronary atherosclerosis. Exercise nuclear study in 2018 showed normal perfusion. Risk factor reduction was recommended. She stopped zetia due to cost. She did not tolerate crestor or lipitor. She continues to smoke and follows with pulmonology.  She called our office in July with chest pain and was advised to go to the ER. Looks like this didn't happen.   She presents today for a yearly follow up.    Hyperlipidemia with LDL goal < 70   Current smoker   Palpitations - atenolol   Grade 1 DD - no dyspnea   Centrilobular emphysema    The patient {does/does not:200015} have symptoms concerning for COVID-19 infection (fever, chills, cough, or new shortness of breath).    Past Medical History:  Diagnosis Date   Arthritis    Asthma    Bipolar 1 disorder ([place])    Bipolar 1 disorder ([place])    Breast cancer ([place])    Depression    Dysrhythmia    palpitations   Dysrhythmia    atrial fibrillation   Fibromyalgia    Finger fracture 2011   GERD (gastroesophageal reflux disease)    Heart murmur    History of stress test 04/28/2011    showed normal perfusion without scar or ischemia   Hx of echocardiogram 04/28/2011   showed normal systolic function with mild diastolic dysfunction,she had trace MR but did not have frank mitral valve prolapse demonstrated. She had mild pulmonary hypertension with an estimated RV systolic pressure at 34 mm.   Hyperlipemia    Meniere disease    MVP (mitral valve prolapse)    Neuromuscular disorder ([place])    fibromyalgia   Past Surgical History:  Procedure Laterality Date   ANKLE SURGERY     left x4   BREAST LUMPECTOMY Right 2011   BREAST SURGERY  2011   lumpectomy right breast    COLONOSCOPY WITH PROPOFOL N/A 02/25/2014   Procedure: COLONOSCOPY WITH PROPOFOL;  Surgeon: Garlan Fair, MD;  Location: [facility];  Service: Endoscopy;  Laterality: N/A;   deviated septum     ECTOPIC PREGNANCY SURGERY     ELBOW SURGERY     right elbow   EYE SURGERY     cataract surgery   fibroid     2-3 fibroid adenomas removed   JOINT REPLACEMENT  2011   right knee    KNEE SURGERY     left knee   SHOULDER ARTHROSCOPY WITH BICEPSTENOTOMY Right 09/25/2015   Procedure: SHOULDER ARTHROSCOPY WITH BICEPSTENOTOMY;  Surgeon: Ninetta Lights, MD;  Location: [place];  Service: Orthopedics;  Laterality: Right;   SHOULDER ARTHROSCOPY WITH DISTAL CLAVICLE RESECTION Right 09/25/2015   Procedure: SHOULDER ARTHROSCOPY WITH DISTAL CLAVICLE RESECTION;  Surgeon: Nestor Ramp  Percell Miller, MD;  Location: [facility];  Service: Orthopedics;  Laterality: Right;   SHOULDER ARTHROSCOPY WITH SUBACROMIAL DECOMPRESSION Right 09/25/2015   Procedure: RIGHT SHOULDER ARTHROSCOPY DEBRIDEMENT,ACROMIOPLASTY,DISTAL CLAVICAL EXCISION, RELEASE OF BICEPS TENDON;  Surgeon: Ninetta Lights, MD;  Location: [facility];  Service: Orthopedics;  Laterality: Right;   TMJ ARTHROPLASTY     TONSILLECTOMY     TOTAL SHOULDER ARTHROPLASTY Left 01/05/2016   UTERINE FIBROID SURGERY      polups and fibroids removed    WRIST SURGERY     left     No outpatient medications have been marked as taking for the 10/15/19 encounter (Appointment) with Ledora Bottcher, [place].     Allergies:   Contrast media [iodinated diagnostic agents], Iodine, Adhesive [tape], Celecoxib, Statins, Ceclor [cefaclor], Moxifloxacin, and Sulfonamide derivatives   Social History   Tobacco Use   Smoking status: Current Every Day Smoker    Packs/day: 1.50    Years: 40.00    Pack years: 60.00    Types: Cigarettes   Smokeless tobacco: Never Used   Tobacco comment: Cannot afford chantix. Working on getting discount from drug compny  Vaping Use   Vaping Use: Never used  Substance Use Topics   Alcohol use: Yes    Alcohol/week: 0.0 standard drinks    Comment: occ   Drug use: No     Family Hx: The patient's family history includes Cancer in her mother; Heart Problems in her mother; Mitral valve prolapse in her mother.  ROS:   Please see the history of present illness.    *** All other systems reviewed and are negative.   Prior CV studies:   The following studies were reviewed today:  Echo 07/08/16: Study Conclusions   - Left ventricle: The cavity size was normal. Wall thickness was  normal. Systolic function was normal. The estimated ejection  fraction was in the range of 60% to 65%. Doppler parameters are  consistent with abnormal left ventricular relaxation (grade 1  diastolic dysfunction).    Nuclear stress test 08/12/16:   The left ventricular ejection fraction is normal (55-65%).  Nuclear stress EF: 58%.  There was no ST segment deviation noted during stress.  No T wave inversion was noted during stress.  The study is normal.  This is a low risk study.    Labs/Other Tests and Data Reviewed:    EKG:  {EKG/Telemetry Strips Reviewed:[phone]}  Recent Labs: No results found for requested labs within last 8760 hours.   Recent Lipid Panel Lab Results   Component Value Date/Time   CHOL 235 (H) 11/25/2017 11:55 AM   TRIG 113 11/25/2017 11:55 AM   HDL 70 11/25/2017 11:55 AM   CHOLHDL 3.4 11/25/2017 11:55 AM   CHOLHDL 2.9 11/22/2013 09:04 AM   LDLCALC 142 (H) 11/25/2017 11:55 AM    Wt Readings from Last 3 Encounters:  01/10/19 174 lb (78.9 kg)  02/09/18 180 lb (81.6 kg)  11/07/17 168 lb (76.2 kg)     Objective:    Vital Signs:  LMP 06/25/2011    {HeartCare Virtual Exam (Optional):[phone]::"VITAL SIGNS:  reviewed"}  ASSESSMENT & PLAN:    1. ***  COVID-19 Education: The signs and symptoms of COVID-19 were discussed with the patient and how to seek care for testing (follow up with PCP or arrange E-visit).  ***The importance of social distancing was discussed today.  Time:   Today, I have spent *** minutes with the patient with telehealth technology discussing the above problems.  Medication Adjustments/Labs and Tests Ordered: Current medicines are reviewed at length with the patient today.  Concerns regarding medicines are outlined above.   Tests Ordered: No orders of the defined types were placed in this encounter.   Medication Changes: No orders of the defined types were placed in this encounter.   Follow Up:  {F/U Format:[phone]} {follow up:15908}  Signed, Ledora Bottcher, PA  10/12/2019 1:57 PM    [facility]

## 2019-10-15 ENCOUNTER — Telehealth: Payer: Medicare HMO | Admitting: Physician Assistant

## 2019-10-22 ENCOUNTER — Other Ambulatory Visit: Payer: Medicare HMO

## 2019-10-22 NOTE — Progress Notes (Unsigned)
HPI Patient presents today to [facility] referred by Wyn Quaker, NP to see pharmacy team for inhaler patient assistance. Past medical history includes COPD, HTN, GERD, HLD, depression, tobacco smoker.  Update per Rachael -Airduo is non-formulary   -Symbicort Copay - $85 -- in donut hole  --> [place] for Symbicort 160-4.5 mcg - 2 puffs BID   Respiratory Medications Current: AirDuo 113/14 mcg 1 puff BID, montelukast 10 mg daily, albuterol PRN  Tried in past: *** Patient reports {Adherence challenges yes [id number]::"VCBSWHQPR challenges","no known adherence challenges"}  OBJECTIVE Allergies  Allergen Reactions  . Contrast Media [Iodinated Diagnostic Agents] Anaphylaxis    Anaphylactic shock.  . Iodine Shortness Of Breath  . Adhesive [Tape] Other (See Comments)    Reaction:  Blisters   . Celecoxib     "I climb the walls"  . Statins     Body aches, constipation AND DEPRESSION  . Ceclor [Cefaclor] Rash  . Moxifloxacin Rash  . Sulfonamide Derivatives Nausea And Vomiting    Outpatient Encounter Medications as of 10/22/2019  Medication Sig  . albuterol (VENTOLIN HFA) 108 (90 Base) MCG/ACT inhaler Inhale 2 puffs into the lungs every 4 (four) hours as needed for wheezing or shortness of breath (cough, shortness of breath or wheezing.).  Marland Kitchen Alcohol Swabs (B-D SINGLE USE SWABS REGULAR) PADS   . atenolol (TENORMIN) 50 MG tablet Take 50 mg in the morning and 25 mg in the evening  . azithromycin (ZITHROMAX) 250 MG tablet Take two tablets on the first day and then one tablet every day after.  . Biotin (BIOTIN 5000) 5 MG CAPS Take 5 mg by mouth daily.  . blood glucose meter kit and supplies KIT Dispense based on patient and insurance preference. Use daily as directed. (FOR ICD-9 250.00, 250.01).  . Calcium-Magnesium-Vitamin D (CALCIUM 1200+D3 PO) Take by mouth.  . cetirizine (ZYRTEC) 10 MG tablet Take 10 mg by mouth daily.   . diclofenac sodium (VOLTAREN) 1 % GEL   .  Evolocumab (REPATHA SURECLICK) 916 MG/ML SOAJ   . ezetimibe (ZETIA) 10 MG tablet Take 1 tablet (10 mg total) by mouth daily. (Patient not taking: Reported on 01/10/2019)  . fluticasone (FLONASE) 50 MCG/ACT nasal spray USE 1 SPRAY IN EACH NOSTRIL TWICE DAILY  . Fluticasone-Salmeterol (AIRDUO RESPICLICK 384/66) 599-35 MCG/ACT AEPB Inhale 1 puff into the lungs 2 (two) times daily.  Marland Kitchen HYDROcodone-acetaminophen (NORCO/VICODIN) 5-325 MG tablet take 1 TABLET BY MOUTH EVERY 6 HOURS AS NEEDED for moderate pain  . L-Methylfolate 15 MG TABS Take 1 tablet (15 mg total) by mouth daily.  Marland Kitchen lamoTRIgine (LAMICTAL) 200 MG tablet TAKE 1/2 TABLET EVERY MORNING  AND TAKE 2 TABLETS AT BEDTIME  . Levomefolate Glucosamine (METHYLFOLATE PO) Take by mouth.  . Magnesium 250 MG TABS Take 250 mg by mouth daily.  . meclizine (ANTIVERT) 25 MG tablet Take 1 tablet (25 mg total) by mouth as needed for dizziness.  . meloxicam (MOBIC) 15 MG tablet TAKE 1 TABLET BY MOUTH ONCE A DAY AS NEEDED INFLAMMATION  . methocarbamol (ROBAXIN) 500 MG tablet TAKE 1 TABLET BY MOUTH EVERY 6 TO 8 HOURS AS NEEDED FOR SPASMS/MUSCLE TENSION  . montelukast (SINGULAIR) 10 MG tablet Take 1 tablet (10 mg total) at bedtime by mouth.  . pantoprazole (PROTONIX) 40 MG tablet Take 1 tablet (40 mg total) by mouth daily.  . pramipexole (MIRAPEX) 0.125 MG tablet Take 1 tab po 2-3 hours before bedtime, then may increase to 2 tabs po QHS after 4-7 days as  tolerated.  Marland Kitchen REPATHA SURECLICK 865 MG/ML SOAJ INJECT 140 MG INTO THE SKIN EVERY 14 DAYS  . traZODone (DESYREL) 100 MG tablet Take 2 tablets (200 mg total) by mouth at bedtime.  . triamcinolone cream (KENALOG) 0.1 % Apply 1 application topically 2 (two) times daily.  Marland Kitchen triamterene-hydrochlorothiazide (MAXZIDE-25) 37.5-25 MG tablet Take 0.5 tablets daily by mouth.  . TRUE METRIX BLOOD GLUCOSE TEST test strip   . TRUEPLUS LANCETS 33G MISC   . venlafaxine XR (EFFEXOR-XR) 75 MG 24 hr capsule Take 3 capsules (225 mg  total) by mouth daily with breakfast.   No facility-administered encounter medications on file as of 10/22/2019.     Immunization History  Administered Date(s) Administered  . Influenza,inj,Quad PF,6+ Mos 10/12/2016, 10/25/2017  . Pneumococcal Polysaccharide-23 11/25/2005  . Pneumococcal-Unspecified 11/25/2005  . Rabies, IM 03/13/2016, 03/16/2016, 03/20/2016, 03/27/2016  . Tdap 03/13/2016     PFTs PFT Results Latest Ref Rng & Units 02/13/2018  FVC-Pre L 2.65  FVC-Predicted Pre % 83  FVC-Post L 2.77  FVC-Predicted Post % 87  Pre FEV1/FVC % % 85  Post FEV1/FCV % % 86  FEV1-Pre L 2.26  FEV1-Predicted Pre % 91  FEV1-Post L 2.40  DLCO uncorrected ml/min/mmHg 16.59  DLCO UNC% % 74  DLVA Predicted % 82     Assessment   1. Symbicort Patient Assistance  Hold upright  1. Prime inhaler - shake for 5 secs and press down away from you to release test spray, you will do this twice - Will only need to do one time, unless it has been unused for more than 7 days  2. Every time you use it, shake for 5 secs first  3. Breathe out fully away from inhaler 4. Then place mouthpiece into your mouth 5. Breathe in deeply and slowly as you press down on the cannister 6. Hold breath for 10 seconds 7. Then breathe out away from the inhaler  8. Wait 30 secs - And repeat again for your second puff  AirDuo - Does not need to be primed. . Hold the inhaler upright and open the yellow cap all the way until it "clicks" . Each time you open the yellow cap and it "clicks", 1 dose of AirDuo RespiClick is ready to be inhaled . For the correct use of AirDuo RespiClick, hold the inhaler upright as you open the yellow cap . Do not open the yellow cap until you are ready to take a dose of AirDuo RespiClick . Breathe out fully away from inhaler . Put the mouthpiece in your mouth and close your lips tightly around it . Do not block the vent above the mouthpiece with your lips or fingers . Breathe in quickly and  deeply through your mouth  . Remove the inhaler from your mouth . Hold your breath for about 10 seconds or for as long as you comfortably can . Your AirDuo RespiClick inhaler delivers your dose of medicine as a very fine powder that you may or may not taste or feel. Do not take an extra dose from the inhaler even if you do not taste or feel the medicine . Make sure you close the yellow cap after each inhalation so that the inhaler will be ready for your next dose . Rinse your mouth with water without swallowing after each inhalation  Demonstrated proper inhaler technique using Symbicort demo inhaler.  Patient able to demonstrate proper inhaler technique using teach back method. Instructed patient to rinse mouth with water after  using in order to prevent fungal infection.  Patient verbalized understanding.  2. Medication Reconciliation  A drug regimen assessment was performed, including review of allergies, interactions, disease-state management, dosing and immunization history. Medications were reviewed with the patient, including name, instructions, indication, goals of therapy, potential side effects, importance of adherence, and safe use.  Drug interaction(s): no major interactions found  3. Immunizations  Patient is indicated for the influenzae and shingles vaccinations.  PLAN 1. Discontinue AirDuo RespiClick once patient has received Symbicort via patient assistance program 2. Fax patient assistance application to AZ&ME for approval  3. Once received, start Symbicort 160-4.5 mcg 2 puffs twice daily   All questions encouraged and answered.  Instructed patient to reach out with any further questions or concerns.  Thank you for allowing pharmacy to participate in this patient's care.  This appointment required 60 minutes of patient care (this includes precharting, chart review, review of results, face-to-face care, etc.).  Lorel Monaco, PharmD PGY2 Ambulatory Care Resident [place]

## 2019-11-23 ENCOUNTER — Ambulatory Visit: Payer: Medicare HMO | Admitting: Internal Medicine

## 2019-12-12 ENCOUNTER — Other Ambulatory Visit: Payer: Self-pay | Admitting: Psychiatry

## 2019-12-12 ENCOUNTER — Encounter: Payer: Self-pay | Admitting: Psychiatry

## 2019-12-12 DIAGNOSIS — F3162 Bipolar disorder, current episode mixed, moderate: Secondary | ICD-10-CM

## 2019-12-12 DIAGNOSIS — F5101 Primary insomnia: Secondary | ICD-10-CM

## 2019-12-13 NOTE — Telephone Encounter (Signed)
Last apt 01/2019 was due back in July, nothing scheduled

## 2019-12-18 ENCOUNTER — Other Ambulatory Visit: Payer: Self-pay | Admitting: Psychiatry

## 2019-12-18 DIAGNOSIS — G2581 Restless legs syndrome: Secondary | ICD-10-CM

## 2019-12-31 ENCOUNTER — Telehealth: Payer: Self-pay | Admitting: Internal Medicine

## 2019-12-31 ENCOUNTER — Ambulatory Visit: Payer: Medicare HMO | Admitting: Internal Medicine

## 2019-12-31 DIAGNOSIS — Z79899 Other long term (current) drug therapy: Secondary | ICD-10-CM

## 2019-12-31 DIAGNOSIS — J449 Chronic obstructive pulmonary disease, unspecified: Secondary | ICD-10-CM

## 2019-12-31 NOTE — Telephone Encounter (Signed)
12/31/2019  Discussed with Dr. Melvyn Novas.  He would like for the patient to reschedule.  Attempted to reach the patient.  Left voicemail.  Message sent to front desk staff: TB Notified Anderson Malta RN who is working with Dr. Melvyn Novas  Will route to them as well as Magda Paganini to ensure patient is scheduled back with Dr. Melvyn Novas.   Wyn Quaker, FNP

## 2019-12-31 NOTE — Telephone Encounter (Signed)
I called Anita Arroyo today about scheduling her [facility] CT and I noticed she has an appt with Dr. Melvyn Novas this afternoon. Anita Arroyo states that she has been sneezing, coughing, runny nose, and stuffy nose and wants to know if she should keep her appt this afternoon. She states she is not sure she could even do a phone visit

## 2020-01-02 MED ORDER — FLUTICASONE-SALMETEROL 113-14 MCG/ACT IN AEPB: 1.0000 | INHALATION_SPRAY | Freq: Two times a day (BID) | RESPIRATORY_TRACT | 0 refills | Status: DC

## 2020-01-02 NOTE — Telephone Encounter (Signed)
Spoke with the pt and scheduled appt with Dr Melvyn Novas for 02/12/19 at 2:15  I refilled her airduo per her request 64 days supply yo [employer]  Nothing further needed

## 2020-01-02 NOTE — Telephone Encounter (Signed)
Please see phone message from 12/6.

## 2020-01-07 ENCOUNTER — Other Ambulatory Visit: Payer: Self-pay | Admitting: Cardiovascular Disease

## 2020-01-28 ENCOUNTER — Inpatient Hospital Stay: Admission: RE | Admit: 2020-01-28 | Payer: Medicare HMO | Source: Ambulatory Visit

## 2020-02-01 ENCOUNTER — Inpatient Hospital Stay: Admission: RE | Admit: 2020-02-01 | Payer: Medicare HMO | Source: Ambulatory Visit

## 2020-02-05 ENCOUNTER — Encounter: Payer: Self-pay | Admitting: Psychiatry

## 2020-02-05 ENCOUNTER — Telehealth: Payer: Self-pay | Admitting: Psychiatry

## 2020-02-05 ENCOUNTER — Telehealth (INDEPENDENT_AMBULATORY_CARE_PROVIDER_SITE_OTHER): Payer: Medicare HMO | Admitting: Psychiatry

## 2020-02-05 DIAGNOSIS — F5101 Primary insomnia: Secondary | ICD-10-CM

## 2020-02-05 DIAGNOSIS — F3162 Bipolar disorder, current episode mixed, moderate: Secondary | ICD-10-CM | POA: Diagnosis not present

## 2020-02-05 DIAGNOSIS — G2581 Restless legs syndrome: Secondary | ICD-10-CM | POA: Diagnosis not present

## 2020-02-05 MED ORDER — VENLAFAXINE HCL ER 150 MG PO CP24: 300.0000 mg | ORAL_CAPSULE | Freq: Every day | ORAL | 0 refills | Status: DC

## 2020-02-05 MED ORDER — PRAMIPEXOLE DIHYDROCHLORIDE 0.125 MG PO TABS: ORAL_TABLET | ORAL | 1 refills | Status: DC

## 2020-02-05 MED ORDER — TRAZODONE HCL 100 MG PO TABS: 200.0000 mg | ORAL_TABLET | Freq: Every day | ORAL | 1 refills | Status: DC

## 2020-02-05 MED ORDER — L-METHYLFOLATE 15 MG PO TABS: 1.0000 | ORAL_TABLET | Freq: Every day | ORAL | 1 refills | Status: DC

## 2020-02-05 MED ORDER — LAMOTRIGINE 200 MG PO TABS: ORAL_TABLET | ORAL | 1 refills | Status: DC

## 2020-02-05 NOTE — Telephone Encounter (Signed)
[name], moree are scheduled for a virtual visit with your provider today.    Just as we do with appointments in the office, we must obtain your consent to participate.  Your consent will be active for this visit and any virtual visit you may have with one of our providers in the next 365 days.    If you have a MyChart account, I can also send a copy of this consent to you electronically.  All virtual visits are billed to your insurance company just like a traditional visit in the office.  As this is a virtual visit, video technology does not allow for your provider to perform a traditional examination.  This may limit your provider's ability to fully assess your condition.  If your provider identifies any concerns that need to be evaluated in person or the need to arrange testing such as labs, EKG, etc, we will make arrangements to do so.    Although advances in technology are sophisticated, we cannot ensure that it will always work on either your end or our end.  If the connection with a video visit is poor, we may have to switch to a telephone visit.  With either a video or telephone visit, we are not always able to ensure that we have a secure connection.   I need to obtain your verbal consent now.   Are you willing to proceed with your visit today?   Anita Arroyo has provided verbal consent on 02/05/2020 for a virtual visit (video or telephone).   Thayer Headings, PMHNP 02/05/2020  1:10 PM

## 2020-02-05 NOTE — Progress Notes (Signed)
Anita Arroyo [id number] 17-Aug-1960 60 y.o.  Virtual Visit via Telephone Note  I connected with pt on 02/05/20 at  1:00 PM EST by telephone and verified that I am speaking with the correct person using two identifiers.   I discussed the limitations, risks, security and privacy concerns of performing an evaluation and management service by telephone and the availability of in person appointments. I also discussed with the patient that there may be a patient responsible charge related to this service. The patient expressed understanding and agreed to proceed.   I discussed the assessment and treatment plan with the patient. The patient was provided an opportunity to ask questions and all were answered. The patient agreed with the plan and demonstrated an understanding of the instructions.   The patient was advised to call back or seek an in-person evaluation if the symptoms worsen or if the condition fails to improve as anticipated.  I provided 30 minutes of non-face-to-face time during this encounter.  The patient was located at home.  The provider was located at [facility].   Corie Chiquito, PMHNP   Subjective:   Patient ID:  Anita Arroyo is a 60 y.o. (DOB [birth year]) female.  Chief Complaint:  Chief Complaint  Patient presents with  . Depression    HPI [name] presents for follow-up of mood and anxiety. She reports that [facility] sent her medications from different manufacturers and she inadvertently was taking more of one medication and not taking another medication. She reports that she had visual disturbance and reached out to her pharmacy to help and "figured out." She reports that her motivation is very low and this has been going on for awhile. She reports that she has wanted to make a soup recipe in a crock pot since before Christmas and has not yet been able to do so. She reports that she has difficulty getting started on "anything." She reports that her house is  cluttered and dirty due to low motivation. Motivation is also low for self-care and hygiene. She reports that her energy has been very low. She is more tired after working. Reports feeling "down and depressed... blah." She reports more irritability than usual. Anxiety has been ok. Sleeping well. Has been having severe night sweats. Appetite has been ok. She reports that she has been larger portions and eats sweets and cheese. She reports anhedonia other than enjoying some TV shows. Reports that she is not leaving home "unless I have too." She reports chronic poor concentration and focus. She reports that motivation has prevented her from paying bills. "I spend money that I don't have to spend." Denies any other impulsive or risky behaviors. Denies SI.   She reports that she typically has worsening depression in winter.   H/o adverse effects with Wellbutrin XL.   Review of Systems:  Review of Systems  Constitutional: Positive for diaphoresis.  Gastrointestinal:       Chronic GI s/s.  Musculoskeletal: Positive for back pain. Negative for gait problem.  Skin: Negative for rash.  Neurological: Negative for tremors.  Psychiatric/Behavioral:       Please refer to HPI  Was bitten by a dog last week.   Medications: I have reviewed the patient's current medications.  Current Outpatient Medications  Medication Sig Dispense Refill  . albuterol (VENTOLIN HFA) 108 (90 Base) MCG/ACT inhaler Inhale 2 puffs into the lungs every 4 (four) hours as needed for wheezing or shortness of breath (cough, shortness of breath or wheezing.).  8 g 11  . atenolol (TENORMIN) 50 MG tablet Take 50 mg in the morning and 25 mg in the evening 135 tablet 1  . Biotin 5 MG CAPS Take 5 mg by mouth daily.    . Calcium-Magnesium-Vitamin D (CALCIUM 1200+D3 PO) Take by mouth.    . cetirizine (ZYRTEC) 10 MG tablet Take 10 mg by mouth daily.    . fluticasone (FLONASE) 50 MCG/ACT nasal spray USE 1 SPRAY IN EACH NOSTRIL TWICE DAILY 48 g  1  . Fluticasone-Salmeterol (AIRDUO RESPICLICK 542/70) 623-76 MCG/ACT AEPB Inhale 1 puff into the lungs 2 (two) times daily. 3 each 0  . HYDROcodone-acetaminophen (NORCO/VICODIN) 5-325 MG tablet take 1 TABLET BY MOUTH EVERY 6 HOURS AS NEEDED for moderate pain 120 tablet 0  . meclizine (ANTIVERT) 25 MG tablet Take 1 tablet (25 mg total) by mouth as needed for dizziness. 30 tablet 1  . meloxicam (MOBIC) 15 MG tablet TAKE 1 TABLET BY MOUTH ONCE A DAY AS NEEDED INFLAMMATION    . methocarbamol (ROBAXIN) 500 MG tablet TAKE 1 TABLET BY MOUTH EVERY 6 TO 8 HOURS AS NEEDED FOR SPASMS/MUSCLE TENSION    . montelukast (SINGULAIR) 10 MG tablet Take 1 tablet (10 mg total) at bedtime by mouth. 90 tablet 3  . pantoprazole (PROTONIX) 40 MG tablet Take 1 tablet (40 mg total) by mouth daily. 90 tablet 1  . REPATHA SURECLICK 283 MG/ML SOAJ INJECT 140 MG INTO THE SKIN EVERY 14 DAYS 2 mL 11  . triamcinolone cream (KENALOG) 0.1 % Apply 1 application topically 2 (two) times daily. 30 g 0  . triamterene-hydrochlorothiazide (MAXZIDE-25) 37.5-25 MG tablet Take 0.5 tablets daily by mouth. 90 tablet 3  . Alcohol Swabs (B-D SINGLE USE SWABS REGULAR) PADS     . blood glucose meter kit and supplies KIT Dispense based on patient and insurance preference. Use daily as directed. (FOR ICD-9 250.00, 250.01). 1 each 0  . diclofenac sodium (VOLTAREN) 1 % GEL daily as needed.    . ezetimibe (ZETIA) 10 MG tablet Take 1 tablet (10 mg total) by mouth daily. (Patient not taking: Reported on 01/10/2019) 30 tablet 5  . L-Methylfolate 15 MG TABS Take 1 tablet (15 mg total) by mouth daily. 90 tablet 1  . lamoTRIgine (LAMICTAL) 200 MG tablet TAKE 1/2 TABLET EVERY MORNING  AND TAKE 2 TABLETS AT BEDTIME 225 tablet 1  . Levomefolate Glucosamine (METHYLFOLATE PO) Take by mouth.    . Magnesium 250 MG TABS Take 250 mg by mouth daily.    . pramipexole (MIRAPEX) 0.125 MG tablet Take 1 tablet by mouth 2-3 hours before bedtime, then may increase to 2 tabs  by mouth at bedtime after 4-7 days as tolerated. 180 tablet 1  . traZODone (DESYREL) 100 MG tablet Take 2 tablets (200 mg total) by mouth at bedtime. 180 tablet 1  . TRUE METRIX BLOOD GLUCOSE TEST test strip     . TRUEPLUS LANCETS 33G MISC     . venlafaxine XR (EFFEXOR-XR) 150 MG 24 hr capsule Take 2 capsules (300 mg total) by mouth daily with breakfast. 180 capsule 0   No current facility-administered medications for this visit.    Medication Side Effects: None  Allergies:  Allergies  Allergen Reactions  . Contrast Media [Iodinated Diagnostic Agents] Anaphylaxis    Anaphylactic shock.  . Iodine Shortness Of Breath  . Adhesive [Tape] Other (See Comments)    Reaction:  Blisters   . Celecoxib     "I climb the walls"  .  Statins     Body aches, constipation AND DEPRESSION  . Ceclor [Cefaclor] Rash  . Moxifloxacin Rash  . Sulfonamide Derivatives Nausea And Vomiting    Past Medical History:  Diagnosis Date  . Arthritis   . Asthma   . Bipolar 1 disorder ([place])   . Bipolar 1 disorder ([place])   . Breast cancer ([place])   . Depression   . Dysrhythmia    palpitations  . Dysrhythmia    atrial fibrillation  . Fibromyalgia   . Finger fracture 2011  . GERD (gastroesophageal reflux disease)   . Heart murmur   . History of stress test 04/28/2011   showed normal perfusion without scar or ischemia  . Hx of echocardiogram 04/28/2011   showed normal systolic function with mild diastolic dysfunction,she had trace MR but did not have frank mitral valve prolapse demonstrated. She had mild pulmonary hypertension with an estimated RV systolic pressure at 34 mm.  . Hyperlipemia   . Meniere disease   . MVP (mitral valve prolapse)   . Neuromuscular disorder ([employer])    fibromyalgia    Family History  Problem Relation Age of Onset  . Mitral valve prolapse Mother   . Heart Problems Mother   . Cancer Mother     Social History   Socioeconomic History  . Marital status: Single    Spouse name:  Not on file  . Number of children: Not on file  . Years of education: Not on file  . Highest education level: Not on file  Occupational History  . Not on file  Tobacco Use  . Smoking status: Current Every Day Smoker    Packs/day: 1.50    Years: 40.00    Pack years: 60.00    Types: Cigarettes  . Smokeless tobacco: Never Used  . Tobacco comment: Cannot afford chantix. Working on getting discount from drug compny  Vaping Use  . Vaping Use: Never used  Substance and Sexual Activity  . Alcohol use: Yes    Alcohol/week: 0.0 standard drinks    Comment: occ  . Drug use: No  . Sexual activity: Not on file  Other Topics Concern  . Not on file  Social History Narrative  . Not on file   Social Determinants of Health   Financial Resource Strain: Not on file  Food Insecurity: Not on file  Transportation Needs: Not on file  Physical Activity: Not on file  Stress: Not on file  Social Connections: Not on file  Intimate Partner Violence: Not on file    Past Medical History, Surgical history, Social history, and Family history were reviewed and updated as appropriate.   Please see review of systems for further details on the patient's review from today.   Objective:   Physical Exam:  LMP 06/25/2011   Physical Exam Neurological:     Mental Status: She is alert and oriented to person, place, and time.     Cranial Nerves: No dysarthria.  Psychiatric:        Attention and Perception: Attention and perception normal.        Mood and Affect: Mood is depressed. Mood is not anxious.        Speech: Speech normal.        Behavior: Behavior is cooperative.        Thought Content: Thought content normal. Thought content is not paranoid or delusional. Thought content does not include homicidal or suicidal ideation. Thought content does not include homicidal or suicidal plan.  Cognition and Memory: Cognition and memory normal.        Judgment: Judgment normal.     Comments: Insight  intact     Lab Review:     Component Value Date/Time   NA 140 11/25/2017 1155   NA 138 08/20/2014 1117   K 4.3 11/25/2017 1155   K 4.7 08/20/2014 1117   CL 98 11/25/2017 1155   CL 104 06/29/2012 1221   CO2 27 11/25/2017 1155   CO2 26 08/20/2014 1117   GLUCOSE 84 11/25/2017 1155   GLUCOSE 82 03/13/2016 1720   GLUCOSE 92 08/20/2014 1117   GLUCOSE 107 (H) 06/29/2012 1221   BUN 17 11/25/2017 1155   BUN 16.0 08/20/2014 1117   CREATININE 0.93 11/25/2017 1155   CREATININE 0.9 08/20/2014 1117   CALCIUM 9.8 11/25/2017 1155   CALCIUM 9.4 08/20/2014 1117   PROT 6.7 11/25/2017 1155   PROT 6.6 08/20/2014 1117   ALBUMIN 4.7 11/25/2017 1155   ALBUMIN 3.9 08/20/2014 1117   AST 21 11/25/2017 1155   AST 22 08/20/2014 1117   ALT 21 11/25/2017 1155   ALT 18 08/20/2014 1117   ALKPHOS 109 11/25/2017 1155   ALKPHOS 54 08/20/2014 1117   BILITOT <0.2 11/25/2017 1155   BILITOT 0.47 08/20/2014 1117   GFRNONAA 68 11/25/2017 1155   GFRAA 78 11/25/2017 1155       Component Value Date/Time   WBC 6.4 08/12/2016 1659   WBC 10.6 (H) 03/13/2016 1720   RBC 4.53 08/12/2016 1659   RBC 4.87 03/13/2016 1720   HGB 14.2 08/12/2016 1659   HGB 13.7 08/20/2014 1117   HCT 41.9 08/12/2016 1659   HCT 40.3 08/20/2014 1117   PLT 230 08/12/2016 1659   MCV 93 08/12/2016 1659   MCV 94.1 08/20/2014 1117   MCH 31.3 08/12/2016 1659   MCH 30.6 03/13/2016 1720   MCHC 33.9 08/12/2016 1659   MCHC 34.3 03/13/2016 1720   RDW 14.1 08/12/2016 1659   RDW 13.2 08/20/2014 1117   LYMPHSABS 2.4 03/13/2016 1720   LYMPHSABS 2.0 08/20/2014 1117   MONOABS 0.9 03/13/2016 1720   MONOABS 0.5 08/20/2014 1117   EOSABS 0.0 03/13/2016 1720   EOSABS 0.1 08/20/2014 1117   BASOSABS 0.0 03/13/2016 1720   BASOSABS 0.1 08/20/2014 1117    No results found for: POCLITH, LITHIUM   No results found for: PHENYTOIN, PHENOBARB, VALPROATE, CBMZ   .res Assessment: Plan:   Patient seen for 30 minutes of time spent counseling patient  regarding possible treatment options for worsening depression.  Discussed potential benefits, risks, and side effects of increasing Effexor XR to 300 mg po qd for depression and anxiety. Discussed that increased sweating can be a side effect of higher doses of SNRI's, however pt reports that she had been tolerating Effexor without sweats until recently. We will continue all other medications as prescribed. Patient to follow-up in 2 months or sooner if clinically indicated. Patient advised to contact office with any questions, adverse effects, or acute worsening in signs and symptoms.  Shaquella was seen today for depression.  Diagnoses and all orders for this visit:  Bipolar 1 disorder, mixed, moderate (HCC) -     venlafaxine XR (EFFEXOR-XR) 150 MG 24 hr capsule; Take 2 capsules (300 mg total) by mouth daily with breakfast. -     lamoTRIgine (LAMICTAL) 200 MG tablet; TAKE 1/2 TABLET EVERY MORNING  AND TAKE 2 TABLETS AT BEDTIME -     L-Methylfolate 15 MG TABS; Take 1 tablet (15  mg total) by mouth daily.  Primary insomnia -     traZODone (DESYREL) 100 MG tablet; Take 2 tablets (200 mg total) by mouth at bedtime.  RLS (restless legs syndrome) -     pramipexole (MIRAPEX) 0.125 MG tablet; Take 1 tablet by mouth 2-3 hours before bedtime, then may increase to 2 tabs by mouth at bedtime after 4-7 days as tolerated.    Please see After Visit Summary for patient specific instructions.  Future Appointments  Date Time Provider [place]  02/07/2020  2:30 PM [facility] 1 [facility]  02/12/2020  2:15 PM Wert, Christena Deem, MD [facility] None    No orders of the defined types were placed in this encounter.     -------------------------------

## 2020-02-07 ENCOUNTER — Other Ambulatory Visit: Payer: Self-pay

## 2020-02-07 ENCOUNTER — Ambulatory Visit (INDEPENDENT_AMBULATORY_CARE_PROVIDER_SITE_OTHER)
Admission: RE | Admit: 2020-02-07 | Discharge: 2020-02-07 | Disposition: A | Discharge status: Home / Self Care | Payer: Medicare HMO | Source: Ambulatory Visit | Attending: Acute Care | Admitting: Acute Care

## 2020-02-07 DIAGNOSIS — Z122 Encounter for screening for malignant neoplasm of respiratory organs: Secondary | ICD-10-CM

## 2020-02-07 DIAGNOSIS — F17210 Nicotine dependence, cigarettes, uncomplicated: Secondary | ICD-10-CM

## 2020-02-07 DIAGNOSIS — Z87891 Personal history of nicotine dependence: Secondary | ICD-10-CM

## 2020-02-12 ENCOUNTER — Ambulatory Visit: Payer: Medicare HMO | Admitting: Internal Medicine

## 2020-02-14 ENCOUNTER — Ambulatory Visit: Payer: Medicare HMO | Admitting: Internal Medicine

## 2020-02-14 NOTE — Progress Notes (Deleted)
Subjective:    Patient ID: Anita Arroyo, female    DOB: 05-Oct-1960     MRN: [id number]  HPI 3 yowf active smoker healthy childhood in 30s started noticing sniffles/ drainage  Spring/ fall > eval by [place] ? Grass/ pollen/ Cats ? Better on shots  around 2017 intermittent cough not related rhinitis flared with uri's  rx advair/ singulair/ albuterol some better but with progressive worse doe  X walking dogs so referred to pulmonary clinic 11/03/2017 by Dr   Everlene Other for ? Copd but spirometry nl at initial ov    11/03/2017   Pulmonary / 1st eval  Chief Complaint  Patient presents with  . Consult    Referred by Dr. Everlene Other for COPD.    Dyspnea:  MMRC3 = can't walk 100 yards even at a slow pace at a flat grade s stopping due to sob   Cough: am's worst x tbsp or two thick all different colors Sleeping: bed flat x 1pillow  SABA use: typical day once every few weeks  02: no  rec Plan A = Automatic = Symbicort 80 Take 2 puffs first thing in am and then another 2 puffs about 12 hours later.  work on inhaler technique:  relax and gently blow all the way out then take a nice smooth deep breath back in, triggering the inhaler at same time you start breathing in.  Hold for up to 5 seconds if you can. Blow out thru nose. Rinse and gargle with water when done Plan B = Backup Only use your albuterol as a rescue medication   The key is to stop smoking completely before smoking completely stops you!  Please schedule a follow up visit in 3 months but call sooner if needed with full pfts   02/13/2018 wnl    02/14/2020  f/u ov/[name] re: ask re kidney cyst f/u  No chief complaint on file.    Dyspnea:  *** Cough: *** Sleeping: *** SABA use: *** 02: ***   No obvious day to day or daytime variability or assoc excess/ purulent sputum or mucus plugs or hemoptysis or cp or chest tightness, subjective wheeze or overt sinus or hb symptoms.   *** without nocturnal  or early am exacerbation  of respiratory  c/o's  or need for noct saba. Also denies any obvious fluctuation of symptoms with weather or environmental changes or other aggravating or alleviating factors except as outlined above   No unusual exposure hx or h/o childhood pna/ asthma or knowledge of premature birth.  Current Allergies, Complete Past Medical History, Past Surgical History, Family History, and Social History were reviewed in [employer] record.  ROS  The following are not active complaints unless bolded Hoarseness, sore throat, dysphagia, dental problems, itching, sneezing,  nasal congestion or discharge of excess mucus or purulent secretions, ear ache,   fever, chills, sweats, unintended wt loss or wt gain, classically pleuritic or exertional cp,  orthopnea pnd or arm/hand swelling  or leg swelling, presyncope, palpitations, abdominal pain, anorexia, nausea, vomiting, diarrhea  or change in bowel habits or change in bladder habits, change in stools or change in urine, dysuria, hematuria,  rash, arthralgias, visual complaints, headache, numbness, weakness or ataxia or problems with walking or coordination,  change in mood or  memory.        No outpatient medications have been marked as taking for the 02/14/20 encounter (Appointment) with Nyoka Cowden, MD.  Objective:   Physical Exam       02/14/2020       ***  11/03/17 168 lb (76.2 kg)  06/24/17 162 lb (73.5 kg)  01/28/17 170 lb 6.4 oz (77.3 kg)      Vital signs reviewed  02/14/2020  - Note at rest 02 sats  ***% on ***   General appearance:    ***   Walks with limp***          Assessment & Plan:

## 2020-02-20 NOTE — Progress Notes (Signed)
Please call patient and let them  know their  low dose Ct was read as a Lung RADS 2: nodules that are benign in appearance and behavior with a very low likelihood of becoming a clinically active cancer due to size or lack of growth. Recommendation per radiology is for a repeat LDCT in 12 months. .Please let them  know we will order and schedule their  annual screening scan for 01/2021 Please let them  know there was notation of CAD on their  scan.  Please remind the patient  that this is a non-gated exam therefore degree or severity of disease  cannot be determined. Please have them  follow up with their PCP regarding potential risk factor modification, dietary therapy or pharmacologic therapy if clinically indicated. Pt.  is not  currently on statin therapy. Please place order for annual  screening scan for  01/2021 and fax results to PCP. Thanks so much.  Denise, please as them to follow up with PCP as there was a notation of CAD, and no statin therapy. Thanks so much  She has an echo from 2018 but it does not look like  she has had follow up.

## 2020-02-21 ENCOUNTER — Other Ambulatory Visit: Payer: Self-pay | Admitting: *Deleted

## 2020-02-21 DIAGNOSIS — Z87891 Personal history of nicotine dependence: Secondary | ICD-10-CM

## 2020-02-21 DIAGNOSIS — F17210 Nicotine dependence, cigarettes, uncomplicated: Secondary | ICD-10-CM

## 2020-03-13 ENCOUNTER — Other Ambulatory Visit: Payer: Self-pay | Admitting: Emergency Medicine

## 2020-03-13 ENCOUNTER — Other Ambulatory Visit: Payer: Self-pay | Admitting: Physician Assistant

## 2020-03-13 DIAGNOSIS — Z1231 Encounter for screening mammogram for malignant neoplasm of breast: Secondary | ICD-10-CM

## 2020-03-14 ENCOUNTER — Ambulatory Visit: Payer: Medicare HMO | Admitting: Internal Medicine

## 2020-04-07 ENCOUNTER — Other Ambulatory Visit: Payer: Self-pay | Admitting: Internal Medicine

## 2020-04-07 ENCOUNTER — Other Ambulatory Visit: Payer: Self-pay | Admitting: Psychiatry

## 2020-04-07 DIAGNOSIS — F3162 Bipolar disorder, current episode mixed, moderate: Secondary | ICD-10-CM

## 2020-04-07 DIAGNOSIS — Z79899 Other long term (current) drug therapy: Secondary | ICD-10-CM

## 2020-04-07 DIAGNOSIS — J449 Chronic obstructive pulmonary disease, unspecified: Secondary | ICD-10-CM

## 2020-04-21 ENCOUNTER — Ambulatory Visit: Payer: Medicare HMO | Admitting: Internal Medicine

## 2020-04-21 NOTE — Progress Notes (Incomplete)
   Subjective:    Patient ID: Anita Arroyo, female    DOB: 1960-06-15     MRN: [id number]   Brief patient profile:  59  yowf active smoker healthy childhood in 52s started noticing sniffles/ drainage  Spring/ fall > eval by [place] ? Grass/ pollen/ Cats ? Better on shots  around 2017 intermittent cough not related rhinitis flared with uri's  rx advair/ singulair/ albuterol some better but with progressive worse doe  X walking dogs so referred to pulmonary clinic 11/03/2017 by Dr   Coletta Memos for ? Copd but spirometry nl at initial ov    History of Present Illness  11/03/2017   Pulmonary / 1st eval  Chief Complaint  Patient presents with  . Consult    Referred by Dr. Coletta Memos for COPD.    Dyspnea:  MMRC3 = can't walk 100 yards even at a slow pace at a flat grade s stopping due to sob   Cough: am's worst x tbsp or two thick all different colors Sleeping: bed flat x 1pillow  SABA use: typical day once every few weeks  02: no  rec Plan A = Automatic = Symbicort 80 Take 2 puffs first thing in am and then another 2 puffs about 12 hours later.  Work on inhaler technique:  Plan B = Backup Only use your albuterol as a rescue medication  The key is to stop smoking completely before smoking completely stops you!    Virtual Visit via Telephone Note 01/01/2019   I connected with Anita Arroyo on 01/01/19 at  2:30 PM EST by telephone and verified that I am speaking with the correct person using two identifiers.   I discussed the limitations, risks, security and privacy concerns of performing an evaluation and management service by telephone and the availability of in person appointments. I also discussed with the patient that there may be a patient responsible charge related to this service. The patient expressed understanding and agreed to proceed.   History of Present Illness: still smoking with nl pfts = copd 0/ AB  Dyspnea:  Walks dogs doe esp [place] sob   Cough: worse in am/ white mucus / some nasal  congestion Sleeping: flat bed / one pillow      No obvious day to day or daytime variability or assoc excess/ purulent sputum or mucus plugs or hemoptysis or cp or chest tightness, subjective wheeze or overt sinus or hb symptoms.    Also denies any obvious fluctuation of symptoms with weather or environmental changes or other aggravating or alleviating factors except as outlined above.   Meds reviewed/ med reconciliation completed       Observations/Objective: Sounds fine on the phone    Assessment and Plan: See problem list for active a/p's   Follow Up Instructions: See avs for instructions unique to this ov which includes revised/ updated med list     I discussed the assessment and treatment plan with the patient. The patient was provided an opportunity to ask questions and all were answered. The patient agreed with the plan and demonstrated an understanding of the instructions.   The patient was advised to call back or seek an in-person evaluation if the symptoms worsen or if the condition fails to improve as anticipated.  I provided 22 minutes of non-face-to-face time during this encounter.   Christinia Gully, MD

## 2020-04-28 ENCOUNTER — Telehealth: Payer: Self-pay

## 2020-04-28 NOTE — Telephone Encounter (Signed)
Recv'd incoming records from [facility] forwarded 8 pages to [facility] 4/4/22fbg

## 2020-05-01 ENCOUNTER — Inpatient Hospital Stay: Admission: RE | Admit: 2020-05-01 | Payer: Medicare HMO | Source: Ambulatory Visit

## 2020-05-01 ENCOUNTER — Telehealth: Payer: Self-pay | Admitting: Gastroenterology

## 2020-05-01 NOTE — Telephone Encounter (Signed)
Hey Dr. Fuller Plan,   Patient is requesting to transfer care to [facility]. We have received records. I will send to you to review. Please advise.   Thank you.

## 2020-05-01 NOTE — Telephone Encounter (Signed)
Requesting to transfer care for a colonoscopy. Sorry forgot to include the reason.

## 2020-05-06 ENCOUNTER — Encounter: Payer: Self-pay | Admitting: Gastroenterology

## 2020-05-07 ENCOUNTER — Ambulatory Visit: Payer: Medicare HMO | Admitting: Primary Care

## 2020-05-07 NOTE — Progress Notes (Deleted)
_0  ID: Anita Arroyo, female    DOB: 1960-10-26, 60 y.o.   MRN: [id number]  No chief complaint on file.   Referring provider: Harrison Mons, PA  HPI: 60 year old female, current smoker. PMH significant for HTN, COPD, GERD, chronic pain syndrome, fibromyalgia, hx breast cancer, hyperlipidemia. Patient of Dr. Melvyn Novas, last seen by pulonary NP on 09/25/19.  05/07/2020 Patient presents today for overdue follow-up COPD. During last visit Symbicort was changed to Airduo 299 respclick. Prescription send to [facility] and was given Good RX card.    LDCT January 2022, lung RADS2. Aortic atherosclerosis and left circumflex coronary artery disease. Mild centrilobular and paraseptal emphysema.    Encourage smoking cessation   Allergies  Allergen Reactions  . Contrast Media [Iodinated Diagnostic Agents] Anaphylaxis    Anaphylactic shock.  . Iodine Shortness Of Breath  . Adhesive [Tape] Other (See Comments)    Reaction:  Blisters   . Celecoxib     "I climb the walls"  . Statins     Body aches, constipation AND DEPRESSION  . Ceclor [Cefaclor] Rash  . Moxifloxacin Rash  . Sulfonamide Derivatives Nausea And Vomiting    Immunization History  Administered Date(s) Administered  . Influenza, High Dose Seasonal PF 11/27/2018, 01/29/2019  . Influenza, Seasonal, Injecte, Preservative Fre 10/25/2017, 11/27/2018  . Influenza,inj,Quad PF,6+ Mos 10/12/2016, 10/25/2017, 11/27/2018  . Influenza-Unspecified 11/24/2019  . Moderna Sars-Covid-2 Vaccination 04/17/2019, 05/15/2019, 11/27/2019  . Pneumococcal Polysaccharide-23 11/25/2005  . Pneumococcal-Unspecified 11/25/2005  . Rabies, IM 03/13/2016, 03/16/2016, 03/20/2016, 03/27/2016  . Tdap 03/13/2016  . Zoster Recombinat (Shingrix) 10/19/2019, 12/24/2019    Past Medical History:  Diagnosis Date  . Arthritis   . Asthma   . Bipolar 1 disorder ([place])   . Bipolar 1 disorder ([place])   . Breast cancer ([place])   . Depression   . Dysrhythmia     palpitations  . Dysrhythmia    atrial fibrillation  . Fibromyalgia   . Finger fracture 2011  . GERD (gastroesophageal reflux disease)   . Heart murmur   . History of stress test 04/28/2011   showed normal perfusion without scar or ischemia  . Hx of echocardiogram 04/28/2011   showed normal systolic function with mild diastolic dysfunction,she had trace MR but did not have frank mitral valve prolapse demonstrated. She had mild pulmonary hypertension with an estimated RV systolic pressure at 34 mm.  . Hyperlipemia   . Meniere disease   . MVP (mitral valve prolapse)   . Neuromuscular disorder ([place])    fibromyalgia    Tobacco History: Social History   Tobacco Use  Smoking Status Current Every Day Smoker  . Packs/day: 1.50  . Years: 40.00  . Pack years: 60.00  . Types: Cigarettes  Smokeless Tobacco Never Used  Tobacco Comment   Cannot afford chantix. Working on getting discount from drug compny   Ready to quit: Not Answered Counseling given: Not Answered Comment: Cannot afford chantix. Working on getting discount from drug compny   Outpatient Medications Prior to Visit  Medication Sig Dispense Refill  . albuterol (VENTOLIN HFA) 108 (90 Base) MCG/ACT inhaler Inhale 2 puffs into the lungs every 4 (four) hours as needed for wheezing or shortness of breath (cough, shortness of breath or wheezing.). 8 g 11  . Alcohol Swabs (B-D SINGLE USE SWABS REGULAR) PADS     . atenolol (TENORMIN) 50 MG tablet Take 50 mg in the morning and 25 mg in the evening 135 tablet 1  . Biotin 5 MG  CAPS Take 5 mg by mouth daily.    . blood glucose meter kit and supplies KIT Dispense based on patient and insurance preference. Use daily as directed. (FOR ICD-9 250.00, 250.01). 1 each 0  . Calcium-Magnesium-Vitamin D (CALCIUM 1200+D3 PO) Take by mouth.    . cetirizine (ZYRTEC) 10 MG tablet Take 10 mg by mouth daily.    . diclofenac sodium (VOLTAREN) 1 % GEL daily as needed.    . ezetimibe (ZETIA) 10 MG  tablet Take 1 tablet (10 mg total) by mouth daily. (Patient not taking: Reported on 01/10/2019) 30 tablet 5  . fluticasone (FLONASE) 50 MCG/ACT nasal spray USE 1 SPRAY IN EACH NOSTRIL TWICE DAILY 48 g 1  . Fluticasone-Salmeterol 113-14 MCG/ACT AEPB INHALE 1 PUFF TWICE DAILY 3 each 0  . HYDROcodone-acetaminophen (NORCO/VICODIN) 5-325 MG tablet take 1 TABLET BY MOUTH EVERY 6 HOURS AS NEEDED for moderate pain 120 tablet 0  . L-Methylfolate 15 MG TABS Take 1 tablet (15 mg total) by mouth daily. 90 tablet 1  . lamoTRIgine (LAMICTAL) 200 MG tablet TAKE 1/2 TABLET EVERY MORNING  AND TAKE 2 TABLETS AT BEDTIME 225 tablet 1  . Levomefolate Glucosamine (METHYLFOLATE PO) Take by mouth.    . Magnesium 250 MG TABS Take 250 mg by mouth daily.    . meclizine (ANTIVERT) 25 MG tablet Take 1 tablet (25 mg total) by mouth as needed for dizziness. 30 tablet 1  . meloxicam (MOBIC) 15 MG tablet TAKE 1 TABLET BY MOUTH ONCE A DAY AS NEEDED INFLAMMATION    . methocarbamol (ROBAXIN) 500 MG tablet TAKE 1 TABLET BY MOUTH EVERY 6 TO 8 HOURS AS NEEDED FOR SPASMS/MUSCLE TENSION    . montelukast (SINGULAIR) 10 MG tablet Take 1 tablet (10 mg total) at bedtime by mouth. 90 tablet 3  . pantoprazole (PROTONIX) 40 MG tablet Take 1 tablet (40 mg total) by mouth daily. 90 tablet 1  . pramipexole (MIRAPEX) 0.125 MG tablet Take 1 tablet by mouth 2-3 hours before bedtime, then may increase to 2 tabs by mouth at bedtime after 4-7 days as tolerated. 180 tablet 1  . REPATHA SURECLICK 094 MG/ML SOAJ INJECT 140 MG INTO THE SKIN EVERY 14 DAYS 2 mL 11  . traZODone (DESYREL) 100 MG tablet Take 2 tablets (200 mg total) by mouth at bedtime. 180 tablet 1  . triamcinolone cream (KENALOG) 0.1 % Apply 1 application topically 2 (two) times daily. 30 g 0  . triamterene-hydrochlorothiazide (MAXZIDE-25) 37.5-25 MG tablet Take 0.5 tablets daily by mouth. 90 tablet 3  . TRUE METRIX BLOOD GLUCOSE TEST test strip     . TRUEPLUS LANCETS 33G MISC     .  venlafaxine XR (EFFEXOR-XR) 150 MG 24 hr capsule TAKE 2 CAPSULES (300 MG TOTAL) BY MOUTH DAILY WITH BREAKFAST. 180 capsule 0   No facility-administered medications prior to visit.      Review of Systems  Review of Systems   Physical Exam  LMP 06/25/2011  Physical Exam   Lab Results:  CBC    Component Value Date/Time   WBC 6.4 08/12/2016 1659   WBC 10.6 (H) 03/13/2016 1720   RBC 4.53 08/12/2016 1659   RBC 4.87 03/13/2016 1720   HGB 14.2 08/12/2016 1659   HGB 13.7 08/20/2014 1117   HCT 41.9 08/12/2016 1659   HCT 40.3 08/20/2014 1117   PLT 230 08/12/2016 1659   MCV 93 08/12/2016 1659   MCV 94.1 08/20/2014 1117   MCH 31.3 08/12/2016 1659   MCH 30.6  03/13/2016 1720   MCHC 33.9 08/12/2016 1659   MCHC 34.3 03/13/2016 1720   RDW 14.1 08/12/2016 1659   RDW 13.2 08/20/2014 1117   LYMPHSABS 2.4 03/13/2016 1720   LYMPHSABS 2.0 08/20/2014 1117   MONOABS 0.9 03/13/2016 1720   MONOABS 0.5 08/20/2014 1117   EOSABS 0.0 03/13/2016 1720   EOSABS 0.1 08/20/2014 1117   BASOSABS 0.0 03/13/2016 1720   BASOSABS 0.1 08/20/2014 1117    BMET    Component Value Date/Time   NA 140 11/25/2017 1155   NA 138 08/20/2014 1117   K 4.3 11/25/2017 1155   K 4.7 08/20/2014 1117   CL 98 11/25/2017 1155   CL 104 06/29/2012 1221   CO2 27 11/25/2017 1155   CO2 26 08/20/2014 1117   GLUCOSE 84 11/25/2017 1155   GLUCOSE 82 03/13/2016 1720   GLUCOSE 92 08/20/2014 1117   GLUCOSE 107 (H) 06/29/2012 1221   BUN 17 11/25/2017 1155   BUN 16.0 08/20/2014 1117   CREATININE 0.93 11/25/2017 1155   CREATININE 0.9 08/20/2014 1117   CALCIUM 9.8 11/25/2017 1155   CALCIUM 9.4 08/20/2014 1117   GFRNONAA 68 11/25/2017 1155   GFRAA 78 11/25/2017 1155    BNP No results found for: BNP  ProBNP No results found for: PROBNP  Imaging: No results found.   Assessment & Plan:   No problem-specific Assessment & Plan notes found for this encounter.     Martyn Ehrich, NP 05/07/2020

## 2020-06-11 ENCOUNTER — Other Ambulatory Visit: Payer: Self-pay | Admitting: Psychiatry

## 2020-06-11 DIAGNOSIS — F3162 Bipolar disorder, current episode mixed, moderate: Secondary | ICD-10-CM

## 2020-06-11 DIAGNOSIS — G2581 Restless legs syndrome: Secondary | ICD-10-CM

## 2020-06-17 NOTE — Telephone Encounter (Signed)
Please get scheduled with Janett Billow, over due for f/u

## 2020-06-20 NOTE — Telephone Encounter (Signed)
Pt has ana appt on 5/31

## 2020-06-24 ENCOUNTER — Telehealth (INDEPENDENT_AMBULATORY_CARE_PROVIDER_SITE_OTHER): Payer: Medicare HMO | Admitting: Psychiatry

## 2020-06-24 ENCOUNTER — Encounter: Payer: Self-pay | Admitting: Psychiatry

## 2020-06-24 DIAGNOSIS — F5101 Primary insomnia: Secondary | ICD-10-CM | POA: Diagnosis not present

## 2020-06-24 DIAGNOSIS — F3162 Bipolar disorder, current episode mixed, moderate: Secondary | ICD-10-CM | POA: Diagnosis not present

## 2020-06-24 MED ORDER — TRAZODONE HCL 100 MG PO TABS
200.0000 mg | ORAL_TABLET | Freq: Every day | ORAL | 1 refills | Status: DC
Start: 2020-06-24 — End: 2020-08-14

## 2020-06-24 MED ORDER — ARIPIPRAZOLE 5 MG PO TABS: 5.0000 mg | ORAL_TABLET | Freq: Every day | ORAL | 1 refills | Status: DC

## 2020-06-24 MED ORDER — LAMOTRIGINE 200 MG PO TABS: ORAL_TABLET | ORAL | 1 refills | Status: DC

## 2020-06-24 NOTE — Progress Notes (Signed)
Anita Arroyo [id number] Dec 03, 1960 60 y.o.  Virtual Visit via Video Note  I connected with pt @ on 06/24/20 at  4:00 PM EDT by a video enabled telemedicine application and verified that I am speaking with the correct person using two identifiers.   I discussed the limitations of evaluation and management by telemedicine and the availability of in person appointments. The patient expressed understanding and agreed to proceed.  I discussed the assessment and treatment plan with the patient. The patient was provided an opportunity to ask questions and all were answered. The patient agreed with the plan and demonstrated an understanding of the instructions.   The patient was advised to call back or seek an in-person evaluation if the symptoms worsen or if the condition fails to improve as anticipated.  I provided 30 minutes of non-face-to-face time during this encounter.  The patient was located at home.  The provider was located at [place].   Thayer Headings, PMHNP   Subjective:   Patient ID:  Anita Arroyo is a 60 y.o. (DOB 10/27/1960) female.  Chief Complaint:  Chief Complaint  Patient presents with  . Depression    HPI Anita Arroyo presents for follow-up of mood disturbance and anxiety. She reports that after increase in medication "I was good for maybe a week and after that, no energy, no motivation at all." She reports sad mood. She reports that depression may have started around her birthday since her father's birthday was the day after her's and they always used to celebrate together. She reports that this has affected her mood annually. She reports that her mood seems to be cycling more frequently. Denies any manic s/s. She reports frequent irritability. She reports that she has been having anxiety "but I don't know about what." She reports that her anxiety is episodic. Sleeping well. She reports awakening over the weekend with RLS and took an additional pramipexole.  Appetite has been decreased compared to usual. She reports that she may have lost some weight. She reports some difficulty with concentration and focus. She has difficulty organizing her calendar and this has caused her frustration at times. She enjoys games on her phone and at times does not feel like doing other things. Denies SI.   She reports that she is having to push herself to do her job. She had to put her cat to sleep about 6 weeks ago. Reports that this is her busy season at work.   She is bothered by neighbors blasting music.   Review of Systems:  Review of Systems  Musculoskeletal: Positive for myalgias. Negative for gait problem.       She reports leg pain  Neurological: Positive for tremors.  Psychiatric/Behavioral:       Please refer to HPI  She reports that she has fallen a few times. She reports that one fall was tripping over something in someone's driveway. She reports that she had another fall going down a spiral stair case.   Medications: I have reviewed the patient's current medications.  Current Outpatient Medications  Medication Sig Dispense Refill  . albuterol (VENTOLIN HFA) 108 (90 Base) MCG/ACT inhaler Inhale 2 puffs into the lungs every 4 (four) hours as needed for wheezing or shortness of breath (cough, shortness of breath or wheezing.). 8 g 11  . ARIPiprazole (ABILIFY) 5 MG tablet Take 1 tablet (5 mg total) by mouth daily. 30 tablet 1  . atenolol (TENORMIN) 50 MG tablet Take 50 mg in the morning  and 25 mg in the evening 135 tablet 1  . Biotin 5 MG CAPS Take 5 mg by mouth daily.    . calcium-vitamin D (OSCAL WITH D) 500-200 MG-UNIT tablet Take 1 tablet by mouth.    . cetirizine (ZYRTEC) 10 MG tablet Take 10 mg by mouth daily.    . fluticasone (FLONASE) 50 MCG/ACT nasal spray USE 1 SPRAY IN EACH NOSTRIL TWICE DAILY 48 g 1  . Fluticasone-Salmeterol 113-14 MCG/ACT AEPB INHALE 1 PUFF TWICE DAILY 3 each 0  . HYDROcodone-acetaminophen (NORCO/VICODIN) 5-325 MG tablet  take 1 TABLET BY MOUTH EVERY 6 HOURS AS NEEDED for moderate pain 120 tablet 0  . L-Methylfolate 15 MG TABS Take 1 tablet (15 mg total) by mouth daily. 90 tablet 1  . Magnesium 500 MG TABS Take 250 mg by mouth daily.    . meclizine (ANTIVERT) 25 MG tablet Take 1 tablet (25 mg total) by mouth as needed for dizziness. 30 tablet 1  . meloxicam (MOBIC) 15 MG tablet TAKE 1 TABLET BY MOUTH ONCE A DAY AS NEEDED INFLAMMATION    . methocarbamol (ROBAXIN) 500 MG tablet TAKE 1 TABLET BY MOUTH EVERY 6 TO 8 HOURS AS NEEDED FOR SPASMS/MUSCLE TENSION    . montelukast (SINGULAIR) 10 MG tablet Take 1 tablet (10 mg total) at bedtime by mouth. 90 tablet 3  . pantoprazole (PROTONIX) 40 MG tablet Take 1 tablet (40 mg total) by mouth daily. 90 tablet 1  . pramipexole (MIRAPEX) 0.125 MG tablet TAKE 1 TABLET 2-3 HOURS BEFORE BEDTIME, THEN MAY INCREASE TO 2 TABLETS AT BEDTIME AFTER 4-7 DAYS AS TOLERATED 180 tablet 0  . REPATHA SURECLICK 341 MG/ML SOAJ INJECT 140 MG INTO THE SKIN EVERY 14 DAYS 2 mL 11  . triamcinolone cream (KENALOG) 0.1 % Apply 1 application topically 2 (two) times daily. 30 g 0  . triamterene-hydrochlorothiazide (MAXZIDE-25) 37.5-25 MG tablet Take 0.5 tablets daily by mouth. 90 tablet 3  . venlafaxine XR (EFFEXOR-XR) 150 MG 24 hr capsule TAKE 2 CAPSULES (300 MG TOTAL) BY MOUTH DAILY WITH BREAKFAST. 180 capsule 0  . Alcohol Swabs (B-D SINGLE USE SWABS REGULAR) PADS     . blood glucose meter kit and supplies KIT Dispense based on patient and insurance preference. Use daily as directed. (FOR ICD-9 250.00, 250.01). 1 each 0  . diclofenac sodium (VOLTAREN) 1 % GEL daily as needed.    . ezetimibe (ZETIA) 10 MG tablet Take 1 tablet (10 mg total) by mouth daily. (Patient not taking: Reported on 01/10/2019) 30 tablet 5  . lamoTRIgine (LAMICTAL) 200 MG tablet TAKE 1/2 TABLET EVERY MORNING  AND TAKE 2 TABLETS AT BEDTIME 225 tablet 1  . traZODone (DESYREL) 100 MG tablet Take 2 tablets (200 mg total) by mouth at  bedtime. 180 tablet 1  . TRUE METRIX BLOOD GLUCOSE TEST test strip     . TRUEPLUS LANCETS 33G MISC      No current facility-administered medications for this visit.    Medication Side Effects: None  Allergies:  Allergies  Allergen Reactions  . Contrast Media [Iodinated Diagnostic Agents] Anaphylaxis    Anaphylactic shock.  . Iodine Shortness Of Breath  . Adhesive [Tape] Other (See Comments)    Reaction:  Blisters   . Celecoxib     "I climb the walls"  . Statins     Body aches, constipation AND DEPRESSION  . Ceclor [Cefaclor] Rash  . Moxifloxacin Rash  . Sulfonamide Derivatives Nausea And Vomiting    Past Medical History:  Diagnosis Date  . Arthritis   . Asthma   . Bipolar 1 disorder ([place])   . Bipolar 1 disorder ([place])   . Breast cancer ([place])   . Depression   . Dysrhythmia    palpitations  . Dysrhythmia    atrial fibrillation  . Fibromyalgia   . Finger fracture 2011  . GERD (gastroesophageal reflux disease)   . Heart murmur   . History of stress test 04/28/2011   showed normal perfusion without scar or ischemia  . Hx of echocardiogram 04/28/2011   showed normal systolic function with mild diastolic dysfunction,she had trace MR but did not have frank mitral valve prolapse demonstrated. She had mild pulmonary hypertension with an estimated RV systolic pressure at 34 mm.  . Hyperlipemia   . Meniere disease   . MVP (mitral valve prolapse)   . Neuromuscular disorder ([place])    fibromyalgia    Family History  Problem Relation Age of Onset  . Mitral valve prolapse Mother   . Heart Problems Mother   . Cancer Mother     Social History   Socioeconomic History  . Marital status: Single    Spouse name: Not on file  . Number of children: Not on file  . Years of education: Not on file  . Highest education level: Not on file  Occupational History  . Not on file  Tobacco Use  . Smoking status: Current Every Day Smoker    Packs/day: 1.50    Years: 40.00    Pack  years: 60.00    Types: Cigarettes  . Smokeless tobacco: Never Used  . Tobacco comment: Cannot afford chantix. Working on getting discount from drug compny  Vaping Use  . Vaping Use: Never used  Substance and Sexual Activity  . Alcohol use: Yes    Alcohol/week: 0.0 standard drinks    Comment: occ  . Drug use: No  . Sexual activity: Not on file  Other Topics Concern  . Not on file  Social History Narrative  . Not on file   Social Determinants of Health   Financial Resource Strain: Not on file  Food Insecurity: Not on file  Transportation Needs: Not on file  Physical Activity: Not on file  Stress: Not on file  Social Connections: Not on file  Intimate Partner Violence: Not on file    Past Medical History, Surgical history, Social history, and Family history were reviewed and updated as appropriate.   Please see review of systems for further details on the patient's review from today.   Objective:   Physical Exam:  LMP 06/25/2011   Physical Exam Constitutional:      General: She is not in acute distress. Musculoskeletal:        General: No deformity.  Neurological:     Mental Status: She is alert and oriented to person, place, and time.     Coordination: Coordination normal.  Psychiatric:        Attention and Perception: Attention and perception normal. She does not perceive auditory or visual hallucinations.        Mood and Affect: Mood is depressed. Mood is not anxious. Affect is not labile, blunt, angry or inappropriate.        Speech: Speech normal.        Behavior: Behavior normal.        Thought Content: Thought content normal. Thought content is not paranoid or delusional. Thought content does not include homicidal or suicidal ideation. Thought content does not include  homicidal or suicidal plan.        Cognition and Memory: Cognition and memory normal.        Judgment: Judgment normal.     Comments: Insight intact      Lab Review:     Component Value  Date/Time   NA 140 11/25/2017 1155   NA 138 08/20/2014 1117   K 4.3 11/25/2017 1155   K 4.7 08/20/2014 1117   CL 98 11/25/2017 1155   CL 104 06/29/2012 1221   CO2 27 11/25/2017 1155   CO2 26 08/20/2014 1117   GLUCOSE 84 11/25/2017 1155   GLUCOSE 82 03/13/2016 1720   GLUCOSE 92 08/20/2014 1117   GLUCOSE 107 (H) 06/29/2012 1221   BUN 17 11/25/2017 1155   BUN 16.0 08/20/2014 1117   CREATININE 0.93 11/25/2017 1155   CREATININE 0.9 08/20/2014 1117   CALCIUM 9.8 11/25/2017 1155   CALCIUM 9.4 08/20/2014 1117   PROT 6.7 11/25/2017 1155   PROT 6.6 08/20/2014 1117   ALBUMIN 4.7 11/25/2017 1155   ALBUMIN 3.9 08/20/2014 1117   AST 21 11/25/2017 1155   AST 22 08/20/2014 1117   ALT 21 11/25/2017 1155   ALT 18 08/20/2014 1117   ALKPHOS 109 11/25/2017 1155   ALKPHOS 54 08/20/2014 1117   BILITOT <0.2 11/25/2017 1155   BILITOT 0.47 08/20/2014 1117   GFRNONAA 68 11/25/2017 1155   GFRAA 78 11/25/2017 1155       Component Value Date/Time   WBC 6.4 08/12/2016 1659   WBC 10.6 (H) 03/13/2016 1720   RBC 4.53 08/12/2016 1659   RBC 4.87 03/13/2016 1720   HGB 14.2 08/12/2016 1659   HGB 13.7 08/20/2014 1117   HCT 41.9 08/12/2016 1659   HCT 40.3 08/20/2014 1117   PLT 230 08/12/2016 1659   MCV 93 08/12/2016 1659   MCV 94.1 08/20/2014 1117   MCH 31.3 08/12/2016 1659   MCH 30.6 03/13/2016 1720   MCHC 33.9 08/12/2016 1659   MCHC 34.3 03/13/2016 1720   RDW 14.1 08/12/2016 1659   RDW 13.2 08/20/2014 1117   LYMPHSABS 2.4 03/13/2016 1720   LYMPHSABS 2.0 08/20/2014 1117   MONOABS 0.9 03/13/2016 1720   MONOABS 0.5 08/20/2014 1117   EOSABS 0.0 03/13/2016 1720   EOSABS 0.1 08/20/2014 1117   BASOSABS 0.0 03/13/2016 1720   BASOSABS 0.1 08/20/2014 1117    No results found for: POCLITH, LITHIUM   No results found for: PHENYTOIN, PHENOBARB, VALPROATE, CBMZ   .res Assessment: Plan:    Patient seen for 30 minutes and time spent counseling the patient regarding depressive signs and symptoms.   Discussed potential benefits, risks, and side effects of Abilify for depression since patient continued to have significant depressive signs and symptoms despite increase in Effexor and taking therapeutic dose of lamotrigine. Discussed potential metabolic side effects associated with atypical antipsychotics, as well as potential risk for movement side effects. Advised pt to contact office if movement side effects occur.  Also advised patient that increases in blood sugar and cholesterol can occur and to notify provider if she experiences increases in glucose levels.  Patient agrees to trial of Abilify 5 mg daily for depression. Continue Effexor XR 300 mg daily for depression and anxiety. Continue lamotrigine 500 mg daily for mood signs and symptoms. Continue trazodone 200 mg at bedtime for insomnia. Patient to follow-up in 6 weeks or sooner if clinically indicated. Patient advised to contact office with any questions, adverse effects, or acute worsening in signs and symptoms.  Analysia was seen today for depression.  Diagnoses and all orders for this visit:  Bipolar 1 disorder, mixed, moderate (H[facility]) -     ARIPiprazole (ABILIFY) 5 MG tablet; Take 1 tablet (5 mg total) by mouth daily. -     lamoTRIgine (LAMICTAL) 200 MG tablet; TAKE 1/2 TABLET EVERY MORNING  AND TAKE 2 TABLETS AT BEDTIME  Primary insomnia -     traZODone (DESYREL) 100 MG tablet; Take 2 tablets (200 mg total) by mouth at bedtime.     Please see After Visit Summary for patient specific instructions.  Future Appointments  Date Time Provider Branch  06/26/2020  1:40 PM [facility] MM 2 [facility]  07/16/2020  2:30 PM [facility] PREVISIT RM 51 [facility]  07/30/2020  1:30 PM Ladene Artist, MD [facility]    No orders of the defined types were placed in this encounter.     -------------------------------

## 2020-06-26 ENCOUNTER — Ambulatory Visit: Payer: Medicare HMO

## 2020-06-30 ENCOUNTER — Other Ambulatory Visit: Payer: Self-pay | Admitting: Internal Medicine

## 2020-06-30 DIAGNOSIS — Z79899 Other long term (current) drug therapy: Secondary | ICD-10-CM

## 2020-06-30 DIAGNOSIS — J449 Chronic obstructive pulmonary disease, unspecified: Secondary | ICD-10-CM

## 2020-07-02 ENCOUNTER — Ambulatory Visit: Payer: Medicare HMO

## 2020-07-08 ENCOUNTER — Telehealth: Payer: Self-pay | Admitting: Psychiatry

## 2020-07-08 NOTE — Telephone Encounter (Signed)
Pt would like a rx for Abilify to be called in to [facility]. Pt said that she is doing great on it.

## 2020-07-09 ENCOUNTER — Other Ambulatory Visit: Payer: Self-pay

## 2020-07-09 DIAGNOSIS — F3162 Bipolar disorder, current episode mixed, moderate: Secondary | ICD-10-CM

## 2020-07-09 MED ORDER — ARIPIPRAZOLE 5 MG PO TABS
5.0000 mg | ORAL_TABLET | Freq: Every day | ORAL | 1 refills | Status: DC
Start: 2020-07-09 — End: 2020-07-18

## 2020-07-09 NOTE — Telephone Encounter (Signed)
Rx sent 

## 2020-07-14 ENCOUNTER — Telehealth: Payer: Self-pay | Admitting: Internal Medicine

## 2020-07-14 DIAGNOSIS — Z79899 Other long term (current) drug therapy: Secondary | ICD-10-CM

## 2020-07-14 DIAGNOSIS — J449 Chronic obstructive pulmonary disease, unspecified: Secondary | ICD-10-CM

## 2020-07-14 MED ORDER — FLUTICASONE-SALMETEROL 113-14 MCG/ACT IN AEPB: 1.0000 | INHALATION_SPRAY | Freq: Two times a day (BID) | RESPIRATORY_TRACT | 0 refills | Status: DC

## 2020-07-14 NOTE — Telephone Encounter (Signed)
Called and spoke with Patient. Patient scheduled 07/25/20 with Eustaquio Maize, NP and is needing a refill for flucasone salmeterol to [facility].  Requested refilled sent to requested pharmacy.  Nothing further at this time.

## 2020-07-16 ENCOUNTER — Other Ambulatory Visit: Payer: Self-pay | Admitting: Psychiatry

## 2020-07-16 DIAGNOSIS — F3162 Bipolar disorder, current episode mixed, moderate: Secondary | ICD-10-CM

## 2020-07-22 ENCOUNTER — Ambulatory Visit (AMBULATORY_SURGERY_CENTER): Payer: Medicare HMO

## 2020-07-22 VITALS — Ht 62.0 in | Wt 170.0 lb

## 2020-07-22 DIAGNOSIS — Z86010 Personal history of colonic polyps: Secondary | ICD-10-CM

## 2020-07-22 MED ORDER — ONDANSETRON HCL 4 MG PO TABS: 4.0000 mg | ORAL_TABLET | ORAL | 0 refills | Status: DC | PRN

## 2020-07-22 MED ORDER — NA SULFATE-K SULFATE-MG SULF 17.5-3.13-1.6 GM/177ML PO SOLN: 1.0000 | Freq: Once | ORAL | 0 refills | Status: AC

## 2020-07-22 NOTE — Addendum Note (Signed)
Addended by: Etheleen Nicks on: 07/22/2020 06:15 PM   Modules accepted: Orders

## 2020-07-22 NOTE — Progress Notes (Signed)
No egg or soy allergy known to patient  No issues with past sedation with any surgeries or procedures Patient denies ever being told they had issues or difficulty with intubation  No FH of Malignant Hyperthermia No diet pills per patient No home 02 use per patient  No blood thinners per patient  Pt denies issues with constipation  No A fib or A flutter  EMMI video to pt or via MyChart  COVID 19 guidelines implemented in PV today with Pt and RN  Pt is fully vaccinated  for Covid    Pt had changed her appt to a different day, had to reenter instructions.  Pt with many questions states she cannot be NPO for 3 hours, let her know, if she drinks, then she cannot have her procedure done, also she cannot smoke or vape for 3 hours.    NO PA's for preps discussed with pt In PV today  Discussed with pt there will be an out-of-pocket cost for prep and that varies from $0 to 70 dollars   Due to the COVID-19 pandemic we are asking patients to follow certain guidelines.  Pt aware of COVID protocols and [facility] guidelines

## 2020-07-25 ENCOUNTER — Ambulatory Visit: Payer: Medicare HMO | Admitting: Primary Care

## 2020-07-30 ENCOUNTER — Encounter: Payer: Medicare HMO | Admitting: Gastroenterology

## 2020-08-04 ENCOUNTER — Ambulatory Visit: Payer: Medicare HMO | Admitting: Primary Care

## 2020-08-11 ENCOUNTER — Ambulatory Visit (AMBULATORY_SURGERY_CENTER): Payer: Medicare HMO | Admitting: Gastroenterology

## 2020-08-11 ENCOUNTER — Other Ambulatory Visit: Payer: Self-pay

## 2020-08-11 ENCOUNTER — Encounter: Payer: Self-pay | Admitting: Gastroenterology

## 2020-08-11 VITALS — BP 108/55 | HR 60 | Temp 97.7°F | Resp 17 | Ht 62.0 in | Wt 170.0 lb

## 2020-08-11 DIAGNOSIS — D122 Benign neoplasm of ascending colon: Secondary | ICD-10-CM | POA: Diagnosis not present

## 2020-08-11 DIAGNOSIS — D123 Benign neoplasm of transverse colon: Secondary | ICD-10-CM

## 2020-08-11 DIAGNOSIS — D125 Benign neoplasm of sigmoid colon: Secondary | ICD-10-CM

## 2020-08-11 DIAGNOSIS — Z86010 Personal history of colonic polyps: Secondary | ICD-10-CM

## 2020-08-11 DIAGNOSIS — D124 Benign neoplasm of descending colon: Secondary | ICD-10-CM

## 2020-08-11 MED ORDER — SODIUM CHLORIDE 0.9 % IV SOLN: 500.0000 mL | Freq: Once | INTRAVENOUS | Status: DC

## 2020-08-11 NOTE — Progress Notes (Signed)
Report given to PACU, vss 

## 2020-08-11 NOTE — Progress Notes (Signed)
CW - VS  Pt's states no medical or surgical changes since previsit or office visit.   Pt reports she is adopted.  But has a relationship with her birth mother and she said she knows her family hx on mothers side, but not her father's history.   IV in left ac.  While I was checking pt in the computer, pt said her IV hurt.  I assess site and there no swelling at site, no redness.  I explained the IV was working but, if she wished I could removed IV and restart another IV.  Pt declined and said to leave it in. [place]

## 2020-08-11 NOTE — Op Note (Signed)
[place] Patient Name: Anita Arroyo Procedure Date: 08/11/2020 3:03 PM MRN: [id number] Endoscopist: Ladene Artist , MD Age: 60 Referring MD:  Date of Birth: 1960/04/16 Gender: Female Account #: [id number] Procedure:                Colonoscopy Indications:              Surveillance: Personal history of adenomatous                            polyps on last colonoscopy > 5 years ago Medicines:                Monitored Anesthesia Care Procedure:                Pre-Anesthesia Assessment:                           - Prior to the procedure, a History and Physical                            was performed, and patient medications and                            allergies were reviewed. The patient's tolerance of                            previous anesthesia was also reviewed. The risks                            and benefits of the procedure and the sedation                            options and risks were discussed with the patient.                            All questions were answered, and informed consent                            was obtained. Prior Anticoagulants: The patient has                            taken no previous anticoagulant or antiplatelet                            agents. ASA Grade Assessment: III - A patient with                            severe systemic disease. After reviewing the risks                            and benefits, the patient was deemed in                            satisfactory condition to undergo the procedure.  After obtaining informed consent, the colonoscope                            was passed under direct vision. Throughout the                            procedure, the patient's blood pressure, pulse, and                            oxygen saturations were monitored continuously. The                            CF [id number] #[id number] was introduced through the anus                            and advanced to the  the cecum, identified by                            appendiceal orifice and ileocecal valve. The                            ileocecal valve, appendiceal orifice, and rectum                            were photographed. The quality of the bowel                            preparation was good. The colonoscopy was performed                            without difficulty. The patient tolerated the                            procedure well. Scope In: 3:11:05 PM Scope Out: 3:35:00 PM Scope Withdrawal Time: 0 hours 21 minutes 27 seconds  Total Procedure Duration: 0 hours 23 minutes 55 seconds  Findings:                 The perianal and digital rectal examinations were                            normal.                           Two sessile polyps were found in the sigmoid colon                            and ascending colon. The polyps were 8 to 11 mm in                            size. These polyps were removed with a cold snare.                            Resection and retrieval were complete.  Three sessile polyps were found in the descending                            colon (2) and transverse colon (1). The polyps were                            6 to 7 mm in size. These polyps were removed with a                            cold snare. Resection and retrieval were complete.                           Many small-mouthed diverticula were found in the                            left colon. There was evidence of diverticular                            spasm. There was no evidence of diverticular                            bleeding.                           The exam was otherwise without abnormality on                            direct and retroflexion views. Complications:            No immediate complications. Estimated blood loss:                            None. Estimated Blood Loss:     Estimated blood loss: none. Impression:               - Two 8 to 11 mm polyps in  the sigmoid colon and in                            the ascending colon, removed with a cold snare.                            Resected and retrieved.                           - Three 6 to 7 mm polyps in the descending colon                            and in the transverse colon, removed with a cold                            snare. Resected and retrieved.                           - Moderate diverticulosis in the left colon.                           -  The examination was otherwise normal on direct                            and retroflexion views. Recommendation:           - Repeat colonoscopy after studies are complete for                            surveillance based on pathology results.                           - Patient has a contact number available for                            emergencies. The signs and symptoms of potential                            delayed complications were discussed with the                            patient. Return to normal activities tomorrow.                            Written discharge instructions were provided to the                            patient.                           - High fiber diet with adequate daily water intake.                           - Continue present medications.                           - Await pathology results. Ladene Artist, MD 08/11/2020 3:40:33 PM This report has been signed electronically.

## 2020-08-11 NOTE — Progress Notes (Signed)
Called to room to assist during endoscopic procedure.  Patient ID and intended procedure confirmed with present staff. Received instructions for my participation in the procedure from the performing physician.  

## 2020-08-11 NOTE — Patient Instructions (Signed)
HANDOUTS ON POLYPS, DIVERTICULOSIS & HIGH FIBER DIET GIVEN TO YOU TODAY  AWAIT PATHOLOGY RESULTS ON POLYPS REMOVED    YOU HAD AN ENDOSCOPIC PROCEDURE TODAY AT [facility]:   Refer to the procedure report that was given to you for any specific questions about what was found during the examination.  If the procedure report does not answer your questions, please call your gastroenterologist to clarify.  If you requested that your care partner not be given the details of your procedure findings, then the procedure report has been included in a sealed envelope for you to review at your convenience later.  YOU SHOULD EXPECT: Some feelings of bloating in the abdomen. Passage of more gas than usual.  Walking can help get rid of the air that was put into your GI tract during the procedure and reduce the bloating. If you had a lower endoscopy (such as a colonoscopy or flexible sigmoidoscopy) you may notice spotting of blood in your stool or on the toilet paper. If you underwent a bowel prep for your procedure, you may not have a normal bowel movement for a few days.  Please Note:  You might notice some irritation and congestion in your nose or some drainage.  This is from the oxygen used during your procedure.  There is no need for concern and it should clear up in a day or so.  SYMPTOMS TO REPORT IMMEDIATELY:  Following lower endoscopy (colonoscopy or flexible sigmoidoscopy):  Excessive amounts of blood in the stool  Significant tenderness or worsening of abdominal pains  Swelling of the abdomen that is new, acute  Fever of 100F or higher   For urgent or emergent issues, a gastroenterologist can be reached at any hour by calling [phone]. Do not use MyChart messaging for urgent concerns.    DIET:  We do recommend a small meal at first, but then you may proceed to your regular diet.  Drink plenty of fluids but you should avoid alcoholic beverages for 24 hours.  ACTIVITY:   You should plan to take it easy for the rest of today and you should NOT DRIVE or use heavy machinery until tomorrow (because of the sedation medicines used during the test).    FOLLOW UP: Our staff will call the number listed on your records 48-72 hours following your procedure to check on you and address any questions or concerns that you may have regarding the information given to you following your procedure. If we do not reach you, we will leave a message.  We will attempt to reach you two times.  During this call, we will ask if you have developed any symptoms of COVID 19. If you develop any symptoms (ie: fever, flu-like symptoms, shortness of breath, cough etc.) before then, please call [phone].  If you test positive for Covid 19 in the 2 weeks post procedure, please call and report this information to [place].    If any biopsies were taken you will be contacted by phone or by letter within the next 1-3 weeks.  Please call us at [phone] if you have not heard about the biopsies in 3 weeks.    SIGNATURES/CONFIDENTIALITY: You and/or your care partner have signed paperwork which will be entered into your electronic medical record.  These signatures attest to the fact that that the information above on your After Visit Summary has been reviewed and is understood.  Full responsibility of the confidentiality of this discharge information lies  with you and/or your care-partner.

## 2020-08-13 ENCOUNTER — Telehealth: Payer: Self-pay

## 2020-08-13 NOTE — Telephone Encounter (Signed)
  Follow up Call-  Call back number 08/11/2020  Post procedure Call Back phone  # [phone] cell  Permission to leave phone message Yes  Some recent data might be hidden     Patient questions:  Do you have a fever, pain , or abdominal swelling? No. Pain Score  0 *  Have you tolerated food without any problems? Yes.    Have you been able to return to your normal activities? Yes.    Do you have any questions about your discharge instructions: Diet   No. Medications  No. Follow up visit  No.  Do you have questions or concerns about your Care? No.  Actions: * If pain score is 4 or above: No action needed, pain <4.  Have you developed a fever since your procedure? No   2.   Have you had an respiratory symptoms (SOB or cough) since your procedure? no  3.   Have you tested positive for COVID 19 since your procedure no  4.   Have you had any family members/close contacts diagnosed with the COVID 19 since your procedure?  no   If yes to any of these questions please route to Joylene John, RN and Joella Prince, RN

## 2020-08-14 ENCOUNTER — Telehealth (INDEPENDENT_AMBULATORY_CARE_PROVIDER_SITE_OTHER): Payer: Medicare HMO | Admitting: Psychiatry

## 2020-08-14 ENCOUNTER — Encounter: Payer: Self-pay | Admitting: Psychiatry

## 2020-08-14 DIAGNOSIS — G2581 Restless legs syndrome: Secondary | ICD-10-CM | POA: Diagnosis not present

## 2020-08-14 DIAGNOSIS — F3162 Bipolar disorder, current episode mixed, moderate: Secondary | ICD-10-CM | POA: Diagnosis not present

## 2020-08-14 DIAGNOSIS — F5101 Primary insomnia: Secondary | ICD-10-CM

## 2020-08-14 MED ORDER — L-METHYLFOLATE 15 MG PO TABS: 1.0000 | ORAL_TABLET | Freq: Every day | ORAL | 1 refills | Status: DC

## 2020-08-14 MED ORDER — ARIPIPRAZOLE 5 MG PO TABS: 5.0000 mg | ORAL_TABLET | Freq: Every day | ORAL | 1 refills | Status: DC

## 2020-08-14 MED ORDER — TRAZODONE HCL 100 MG PO TABS: 200.0000 mg | ORAL_TABLET | Freq: Every day | ORAL | 1 refills | Status: DC

## 2020-08-14 MED ORDER — VENLAFAXINE HCL ER 150 MG PO CP24: 300.0000 mg | ORAL_CAPSULE | Freq: Every day | ORAL | 0 refills | Status: DC

## 2020-08-14 MED ORDER — PRAMIPEXOLE DIHYDROCHLORIDE 0.125 MG PO TABS: ORAL_TABLET | ORAL | 1 refills | Status: DC

## 2020-08-14 MED ORDER — LAMOTRIGINE 200 MG PO TABS: ORAL_TABLET | ORAL | 1 refills | Status: DC

## 2020-08-15 NOTE — Progress Notes (Signed)
Anita Arroyo [id number] September 22, 1960 60 y.o.  Virtual Visit via Video Note  I connected with pt @ on 08/15/20 at  2:30 PM EDT by a video enabled telemedicine application and verified that I am speaking with the correct person using two identifiers.   I discussed the limitations of evaluation and management by telemedicine and the availability of in person appointments. The patient expressed understanding and agreed to proceed.  I discussed the assessment and treatment plan with the patient. The patient was provided an opportunity to ask questions and all were answered. The patient agreed with the plan and demonstrated an understanding of the instructions.   The patient was advised to call back or seek an in-person evaluation if the symptoms worsen or if the condition fails to improve as anticipated.  I provided 25 minutes of non-face-to-face time during this encounter.  The patient was located at home.  The provider was located at [place].   Thayer Headings, PMHNP   Subjective:   Patient ID:  Anita Arroyo is a 60 y.o. (DOB 12/01/1960) female.  Chief Complaint:  Chief Complaint  Patient presents with   Follow-up    Mood disturbance     HPI Anita Arroyo presents for follow-up of mood disturbance. She reports that she is doing "excellent." She reports that her mood, energy, and motivation have improved with Abilify. She reports significantly improved mood and other people have also noticed an improvement in her mood. She reports that she denies irritability or mood lability. Denies any recent anxiety. She had some anxiety with colonoscopy prep. Denies any tearfulness. She reports that she has been sleeping ok. Appetite has been good. Denies any increase in appetite with Abilify. She has been trying to eat healthier. Denies elevated mood. Denies impulsive or risky behavior. Denies excessive spending. She reports that her concentration has been good. Denies SI.   Denies any change  in RLS.   She reports that her work has been busy and she enjoys it. Has started enjoying houseplants.   Review of Systems:  Review of Systems  Gastrointestinal: Negative.   Musculoskeletal:  Negative for gait problem.  Neurological:  Negative for tremors.  Psychiatric/Behavioral:         Please refer to HPI   Medications: I have reviewed the patient's current medications.  Current Outpatient Medications  Medication Sig Dispense Refill   albuterol (VENTOLIN HFA) 108 (90 Base) MCG/ACT inhaler Inhale 2 puffs into the lungs every 4 (four) hours as needed for wheezing or shortness of breath (cough, shortness of breath or wheezing.). 8 g 11   atenolol (TENORMIN) 50 MG tablet Take 50 mg in the morning and 25 mg in the evening 135 tablet 1   Biotin 5 MG CAPS Take 5 mg by mouth daily.     calcium-vitamin D (OSCAL WITH D) 500-200 MG-UNIT tablet Take 1 tablet by mouth.     cetirizine (ZYRTEC) 10 MG tablet Take 10 mg by mouth daily.     diclofenac sodium (VOLTAREN) 1 % GEL daily as needed.     fluticasone (FLONASE) 50 MCG/ACT nasal spray USE 1 SPRAY IN EACH NOSTRIL TWICE DAILY 48 g 1   Fluticasone-Salmeterol 113-14 MCG/ACT AEPB Inhale 1 puff into the lungs 2 (two) times daily. 1 each 0   HYDROcodone-acetaminophen (NORCO/VICODIN) 5-325 MG tablet take 1 TABLET BY MOUTH EVERY 6 HOURS AS NEEDED for moderate pain 120 tablet 0   Magnesium 500 MG TABS Take 250 mg by mouth daily.  meclizine (ANTIVERT) 25 MG tablet Take 1 tablet (25 mg total) by mouth as needed for dizziness. 30 tablet 1   meloxicam (MOBIC) 15 MG tablet TAKE 1 TABLET BY MOUTH ONCE A DAY AS NEEDED INFLAMMATION     methocarbamol (ROBAXIN) 500 MG tablet TAKE 1 TABLET BY MOUTH EVERY 6 TO 8 HOURS AS NEEDED FOR SPASMS/MUSCLE TENSION     montelukast (SINGULAIR) 10 MG tablet Take 1 tablet (10 mg total) at bedtime by mouth. 90 tablet 3   pantoprazole (PROTONIX) 40 MG tablet Take 1 tablet (40 mg total) by mouth daily. 90 tablet 1   REPATHA  SURECLICK 025 MG/ML SOAJ INJECT 140 MG INTO THE SKIN EVERY 14 DAYS 2 mL 11   triamterene-hydrochlorothiazide (MAXZIDE-25) 37.5-25 MG tablet Take 0.5 tablets daily by mouth. 90 tablet 3   Alcohol Swabs (B-D SINGLE USE SWABS REGULAR) PADS      ARIPiprazole (ABILIFY) 5 MG tablet Take 1 tablet (5 mg total) by mouth daily. 90 tablet 1   blood glucose meter kit and supplies KIT Dispense based on patient and insurance preference. Use daily as directed. (FOR ICD-9 250.00, 250.01). 1 each 0   ezetimibe (ZETIA) 10 MG tablet Take 1 tablet (10 mg total) by mouth daily. 30 tablet 5   L-Methylfolate 15 MG TABS Take 1 tablet (15 mg total) by mouth daily. 90 tablet 1   lamoTRIgine (LAMICTAL) 200 MG tablet TAKE 1/2 TABLET EVERY MORNING  AND TAKE 2 TABLETS AT BEDTIME 225 tablet 1   pramipexole (MIRAPEX) 0.125 MG tablet TAKE 1 TABLET 2-3 HOURS BEFORE BEDTIME, THEN MAY INCREASE TO 2 TABLETS AT BEDTIME AFTER 4-7 DAYS AS TOLERATED 180 tablet 1   traZODone (DESYREL) 100 MG tablet Take 2 tablets (200 mg total) by mouth at bedtime. 180 tablet 1   triamcinolone cream (KENALOG) 0.1 % Apply 1 application topically 2 (two) times daily. 30 g 0   TRUE METRIX BLOOD GLUCOSE TEST test strip  (Patient not taking: Reported on 08/11/2020)     TRUEPLUS LANCETS 33G MISC  (Patient not taking: Reported on 08/11/2020)     venlafaxine XR (EFFEXOR-XR) 150 MG 24 hr capsule Take 2 capsules (300 mg total) by mouth daily with breakfast. 180 capsule 0   No current facility-administered medications for this visit.    Medication Side Effects: Other: Denies involuntary movements  Allergies:  Allergies  Allergen Reactions   Contrast Media [Iodinated Diagnostic Agents] Anaphylaxis    Anaphylactic shock.   Iodine Shortness Of Breath   Ezetimibe Other (See Comments)    "I can't take any cholesterol medicines." "I can't take any cholesterol medicines."    Statins Other (See Comments)    Body aches, constipation AND DEPRESSION Body aches,  constipation Body aches, constipation AND DEPRESSION   Sulfa Antibiotics Nausea And Vomiting   Adhesive [Tape] Other (See Comments)    Reaction:  Blisters    Celecoxib     "I climb the walls"   Augmentin [Amoxicillin-Pot Clavulanate] Rash and Other (See Comments)    Has patient had a PCN reaction causing immediate rash, facial/tongue/throat swelling, SOB or lightheadedness with hypotension: No Has patient had a PCN reaction causing severe rash involving mucus membranes or skin necrosis: No Has patient had a PCN reaction that required hospitalization No Has patient had a PCN reaction occurring within the last 10 years: No If all of the above answers are "NO", then may proceed with Cephalosporin use.   Ceclor [Cefaclor] Rash   Moxifloxacin Rash   Sulfonamide Derivatives  Nausea And Vomiting    Past Medical History:  Diagnosis Date   Allergy    pollen and environmental   Anxiety    Arthritis    Asthma    Bipolar 1 disorder (HCC)    Bipolar 1 disorder (HCC)    Breast cancer ([place])    Cataract    COPD (chronic obstructive pulmonary disease) (HCC)    Depression    Dysrhythmia    palpitations   Dysrhythmia    atrial fibrillation   Fibromyalgia    Finger fracture 2011   GERD (gastroesophageal reflux disease)    Heart murmur    History of stress test 04/28/2011   showed normal perfusion without scar or ischemia   Hx of echocardiogram 04/28/2011   showed normal systolic function with mild diastolic dysfunction,she had trace MR but did not have frank mitral valve prolapse demonstrated. She had mild pulmonary hypertension with an estimated RV systolic pressure at 34 mm.   Hyperlipemia    Meniere disease    MVP (mitral valve prolapse)    Neuromuscular disorder (HCC)    fibromyalgia    Family History  Problem Relation Age of Onset   Breast cancer Mother        biological mother   Mitral valve prolapse Mother    Heart Problems Mother    Cancer Mother    Colon cancer Maternal  Grandfather    Esophageal cancer Neg Hx    Rectal cancer Neg Hx    Stomach cancer Neg Hx     Social History   Socioeconomic History   Marital status: Single    Spouse name: Not on file   Number of children: Not on file   Years of education: Not on file   Highest education level: Not on file  Occupational History   Not on file  Tobacco Use   Smoking status: Every Day    Packs/day: 1.50    Years: 40.00    Pack years: 60.00    Types: Cigarettes   Smokeless tobacco: Never   Tobacco comments:    Cannot afford chantix. Working on getting discount from drug compny  Vaping Use   Vaping Use: Some days   Start date: 06/23/2020   Substances: Nicotine  Substance and Sexual Activity   Alcohol use: Not Currently    Comment: less than monthly   Drug use: No   Sexual activity: Not on file  Other Topics Concern   Not on file  Social History Narrative   Not on file   Social Determinants of Health   Financial Resource Strain: Not on file  Food Insecurity: Not on file  Transportation Needs: Not on file  Physical Activity: Not on file  Stress: Not on file  Social Connections: Not on file  Intimate Partner Violence: Not on file    Past Medical History, Surgical history, Social history, and Family history were reviewed and updated as appropriate.   Please see review of systems for further details on the patient's review from today.   Objective:   Physical Exam:  LMP 06/25/2011   Physical Exam Constitutional:      General: She is not in acute distress. Musculoskeletal:        General: No deformity.  Neurological:     Mental Status: She is alert and oriented to person, place, and time.     Coordination: Coordination normal.  Psychiatric:        Attention and Perception: Attention and perception normal. She does not  perceive auditory or visual hallucinations.        Mood and Affect: Mood normal. Mood is not anxious or depressed. Affect is not labile, blunt, angry or  inappropriate.        Speech: Speech normal.        Behavior: Behavior normal.        Thought Content: Thought content normal. Thought content is not paranoid or delusional. Thought content does not include homicidal or suicidal ideation. Thought content does not include homicidal or suicidal plan.        Cognition and Memory: Cognition and memory normal.        Judgment: Judgment normal.     Comments: Insight intact    Lab Review:     Component Value Date/Time   NA 140 11/25/2017 1155   NA 138 08/20/2014 1117   K 4.3 11/25/2017 1155   K 4.7 08/20/2014 1117   CL 98 11/25/2017 1155   CL 104 06/29/2012 1221   CO2 27 11/25/2017 1155   CO2 26 08/20/2014 1117   GLUCOSE 84 11/25/2017 1155   GLUCOSE 82 03/13/2016 1720   GLUCOSE 92 08/20/2014 1117   GLUCOSE 107 (H) 06/29/2012 1221   BUN 17 11/25/2017 1155   BUN 16.0 08/20/2014 1117   CREATININE 0.93 11/25/2017 1155   CREATININE 0.9 08/20/2014 1117   CALCIUM 9.8 11/25/2017 1155   CALCIUM 9.4 08/20/2014 1117   PROT 6.7 11/25/2017 1155   PROT 6.6 08/20/2014 1117   ALBUMIN 4.7 11/25/2017 1155   ALBUMIN 3.9 08/20/2014 1117   AST 21 11/25/2017 1155   AST 22 08/20/2014 1117   ALT 21 11/25/2017 1155   ALT 18 08/20/2014 1117   ALKPHOS 109 11/25/2017 1155   ALKPHOS 54 08/20/2014 1117   BILITOT <0.2 11/25/2017 1155   BILITOT 0.47 08/20/2014 1117   GFRNONAA 68 11/25/2017 1155   GFRAA 78 11/25/2017 1155       Component Value Date/Time   WBC 6.4 08/12/2016 1659   WBC 10.6 (H) 03/13/2016 1720   RBC 4.53 08/12/2016 1659   RBC 4.87 03/13/2016 1720   HGB 14.2 08/12/2016 1659   HGB 13.7 08/20/2014 1117   HCT 41.9 08/12/2016 1659   HCT 40.3 08/20/2014 1117   PLT 230 08/12/2016 1659   MCV 93 08/12/2016 1659   MCV 94.1 08/20/2014 1117   MCH 31.3 08/12/2016 1659   MCH 30.6 03/13/2016 1720   MCHC 33.9 08/12/2016 1659   MCHC 34.3 03/13/2016 1720   RDW 14.1 08/12/2016 1659   RDW 13.2 08/20/2014 1117   LYMPHSABS 2.4 03/13/2016 1720    LYMPHSABS 2.0 08/20/2014 1117   MONOABS 0.9 03/13/2016 1720   MONOABS 0.5 08/20/2014 1117   EOSABS 0.0 03/13/2016 1720   EOSABS 0.1 08/20/2014 1117   BASOSABS 0.0 03/13/2016 1720   BASOSABS 0.1 08/20/2014 1117    No results found for: POCLITH, LITHIUM   No results found for: PHENYTOIN, PHENOBARB, VALPROATE, CBMZ   .res Assessment: Plan:    Patient seen for 30 minutes and time spent discussing response to Abilify and reviewing potential adverse effects, to include tardive dyskinesia and metabolic side effects.  Patient advised to contact provider if she experiences any of these side effects. Continue Abilify 5 mg daily for mood signs and symptoms. Continue lamotrigine 300 mg at bedtime for mood signs and symptoms. Continue L-methylfolate for augmentation of depression. Continue trazodone 200 mg at bedtime for insomnia. Continue pramipexole 0.125 mg 2-3 tabs at bedtime for restless leg syndrome. Patient  to follow-up in 6 months or sooner if clinically indicated. Patient advised to contact office with any questions, adverse effects, or acute worsening in signs and symptoms.   Anita Arroyo was seen today for follow-up.  Diagnoses and all orders for this visit:  Bipolar 1 disorder, mixed, moderate (HCC) -     ARIPiprazole (ABILIFY) 5 MG tablet; Take 1 tablet (5 mg total) by mouth daily. -     venlafaxine XR (EFFEXOR-XR) 150 MG 24 hr capsule; Take 2 capsules (300 mg total) by mouth daily with breakfast. -     lamoTRIgine (LAMICTAL) 200 MG tablet; TAKE 1/2 TABLET EVERY MORNING  AND TAKE 2 TABLETS AT BEDTIME -     L-Methylfolate 15 MG TABS; Take 1 tablet (15 mg total) by mouth daily.  Primary insomnia -     traZODone (DESYREL) 100 MG tablet; Take 2 tablets (200 mg total) by mouth at bedtime.  RLS (restless legs syndrome) -     pramipexole (MIRAPEX) 0.125 MG tablet; TAKE 1 TABLET 2-3 HOURS BEFORE BEDTIME, THEN MAY INCREASE TO 2 TABLETS AT BEDTIME AFTER 4-7 DAYS AS TOLERATED    Please see  After Visit Summary for patient specific instructions.  Future Appointments  Date Time Provider [place]  08/27/2020 12:00 PM Martyn Ehrich, NP [facility] None    No orders of the defined types were placed in this encounter.     -------------------------------

## 2020-08-18 ENCOUNTER — Other Ambulatory Visit: Payer: Self-pay | Admitting: Internal Medicine

## 2020-08-18 DIAGNOSIS — Z79899 Other long term (current) drug therapy: Secondary | ICD-10-CM

## 2020-08-18 DIAGNOSIS — J449 Chronic obstructive pulmonary disease, unspecified: Secondary | ICD-10-CM

## 2020-08-22 ENCOUNTER — Encounter: Payer: Self-pay | Admitting: Gastroenterology

## 2020-08-27 ENCOUNTER — Ambulatory Visit: Payer: Medicare HMO | Admitting: Primary Care

## 2020-09-03 ENCOUNTER — Other Ambulatory Visit: Payer: Self-pay | Admitting: Physician Assistant

## 2020-09-03 DIAGNOSIS — Z1231 Encounter for screening mammogram for malignant neoplasm of breast: Secondary | ICD-10-CM

## 2020-09-04 ENCOUNTER — Ambulatory Visit: Payer: Medicare HMO

## 2020-09-08 ENCOUNTER — Ambulatory Visit: Payer: Medicare HMO | Admitting: Primary Care

## 2020-09-08 NOTE — Progress Notes (Deleted)
$'@Patient'p$  ID: Anita Arroyo, female    DOB: 1960/02/06, 60 y.o.   MRN: [id number]  No chief complaint on file.   Referring provider: Harrison Mons, PA  HPI: 60 year old female, current everyday smoker.  Past medical history significant for COPD, hypertension, GERD, fibromyalgia, hyperlipidemia, prediabetes.  Patient of Dr. Melvyn Novas, last seen by pulmonary nurse practitioner on 09/25/2019.  09/08/2020 Patient presents today for overdue follow-up.  She was last seen in our office in August 2021.  During her last visit she was started on air duo 113 in place of Symbicort 160.    Allergies  Allergen Reactions   Contrast Media [Iodinated Diagnostic Agents] Anaphylaxis    Anaphylactic shock.   Iodine Shortness Of Breath   Ezetimibe Other (See Comments)    "I can't take any cholesterol medicines." "I can't take any cholesterol medicines."    Statins Other (See Comments)    Body aches, constipation AND DEPRESSION Body aches, constipation Body aches, constipation AND DEPRESSION   Sulfa Antibiotics Nausea And Vomiting   Adhesive [Tape] Other (See Comments)    Reaction:  Blisters    Celecoxib     "I climb the walls"   Augmentin [Amoxicillin-Pot Clavulanate] Rash and Other (See Comments)    Has patient had a PCN reaction causing immediate rash, facial/tongue/throat swelling, SOB or lightheadedness with hypotension: No Has patient had a PCN reaction causing severe rash involving mucus membranes or skin necrosis: No Has patient had a PCN reaction that required hospitalization No Has patient had a PCN reaction occurring within the last 10 years: No If all of the above answers are "NO", then may proceed with Cephalosporin use.   Ceclor [Cefaclor] Rash   Moxifloxacin Rash   Sulfonamide Derivatives Nausea And Vomiting    Immunization History  Administered Date(s) Administered   Influenza, High Dose Seasonal PF 11/27/2018, 01/29/2019   Influenza, Seasonal, Injecte, Preservative Fre 10/25/2017,  11/27/2018   Influenza,inj,Quad PF,6+ Mos 10/12/2016, 10/25/2017, 11/27/2018   Influenza-Unspecified 11/24/2019   Moderna Sars-Covid-2 Vaccination 04/17/2019, 05/15/2019, 11/27/2019   Pneumococcal Polysaccharide-23 11/25/2005   Pneumococcal-Unspecified 11/25/2005   Rabies, IM 03/13/2016, 03/16/2016, 03/20/2016, 03/27/2016   Tdap 03/13/2016   Zoster Recombinat (Shingrix) 10/19/2019, 12/24/2019    Past Medical History:  Diagnosis Date   Allergy    pollen and environmental   Anxiety    Arthritis    Asthma    Bipolar 1 disorder ([place])    Bipolar 1 disorder ([place])    Breast cancer ([place])    Cataract    COPD (chronic obstructive pulmonary disease) ([place])    Depression    Dysrhythmia    palpitations   Dysrhythmia    atrial fibrillation   Fibromyalgia    Finger fracture 2011   GERD (gastroesophageal reflux disease)    Heart murmur    History of stress test 04/28/2011   showed normal perfusion without scar or ischemia   Hx of echocardiogram 04/28/2011   showed normal systolic function with mild diastolic dysfunction,she had trace MR but did not have frank mitral valve prolapse demonstrated. She had mild pulmonary hypertension with an estimated RV systolic pressure at 34 mm.   Hyperlipemia    Meniere disease    MVP (mitral valve prolapse)    Neuromuscular disorder (HCC)    fibromyalgia    Tobacco History: Social History   Tobacco Use  Smoking Status Every Day   Packs/day: 1.50   Years: 40.00   Pack years: 60.00   Types: Cigarettes  Smokeless Tobacco Never  Tobacco Comments   Cannot afford chantix. Working on getting discount from drug compny   Ready to quit: Not Answered Counseling given: Not Answered Tobacco comments: Cannot afford chantix. Working on getting discount from drug compny   Outpatient Medications Prior to Visit  Medication Sig Dispense Refill   albuterol (VENTOLIN HFA) 108 (90 Base) MCG/ACT inhaler Inhale 2 puffs into the lungs every 4 (four) hours as  needed for wheezing or shortness of breath (cough, shortness of breath or wheezing.). 8 g 11   Alcohol Swabs (B-D SINGLE USE SWABS REGULAR) PADS      ARIPiprazole (ABILIFY) 5 MG tablet Take 1 tablet (5 mg total) by mouth daily. 90 tablet 1   atenolol (TENORMIN) 50 MG tablet Take 50 mg in the morning and 25 mg in the evening 135 tablet 1   Biotin 5 MG CAPS Take 5 mg by mouth daily.     blood glucose meter kit and supplies KIT Dispense based on patient and insurance preference. Use daily as directed. (FOR ICD-9 250.00, 250.01). 1 each 0   calcium-vitamin D (OSCAL WITH D) 500-200 MG-UNIT tablet Take 1 tablet by mouth.     cetirizine (ZYRTEC) 10 MG tablet Take 10 mg by mouth daily.     diclofenac sodium (VOLTAREN) 1 % GEL daily as needed.     ezetimibe (ZETIA) 10 MG tablet Take 1 tablet (10 mg total) by mouth daily. 30 tablet 5   fluticasone (FLONASE) 50 MCG/ACT nasal spray USE 1 SPRAY IN EACH NOSTRIL TWICE DAILY 48 g 1   Fluticasone-Salmeterol 113-14 MCG/ACT AEPB inhale 1 PUFF into THE lungs 2 TIMES DAILY 1 each 0   HYDROcodone-acetaminophen (NORCO/VICODIN) 5-325 MG tablet take 1 TABLET BY MOUTH EVERY 6 HOURS AS NEEDED for moderate pain 120 tablet 0   L-Methylfolate 15 MG TABS Take 1 tablet (15 mg total) by mouth daily. 90 tablet 1   lamoTRIgine (LAMICTAL) 200 MG tablet TAKE 1/2 TABLET EVERY MORNING  AND TAKE 2 TABLETS AT BEDTIME 225 tablet 1   Magnesium 500 MG TABS Take 250 mg by mouth daily.     meclizine (ANTIVERT) 25 MG tablet Take 1 tablet (25 mg total) by mouth as needed for dizziness. 30 tablet 1   meloxicam (MOBIC) 15 MG tablet TAKE 1 TABLET BY MOUTH ONCE A DAY AS NEEDED INFLAMMATION     methocarbamol (ROBAXIN) 500 MG tablet TAKE 1 TABLET BY MOUTH EVERY 6 TO 8 HOURS AS NEEDED FOR SPASMS/MUSCLE TENSION     montelukast (SINGULAIR) 10 MG tablet Take 1 tablet (10 mg total) at bedtime by mouth. 90 tablet 3   pantoprazole (PROTONIX) 40 MG tablet Take 1 tablet (40 mg total) by mouth daily. 90  tablet 1   pramipexole (MIRAPEX) 0.125 MG tablet TAKE 1 TABLET 2-3 HOURS BEFORE BEDTIME, THEN MAY INCREASE TO 2 TABLETS AT BEDTIME AFTER 4-7 DAYS AS TOLERATED 180 tablet 1   REPATHA SURECLICK 330 MG/ML SOAJ INJECT 140 MG INTO THE SKIN EVERY 14 DAYS 2 mL 11   traZODone (DESYREL) 100 MG tablet Take 2 tablets (200 mg total) by mouth at bedtime. 180 tablet 1   triamcinolone cream (KENALOG) 0.1 % Apply 1 application topically 2 (two) times daily. 30 g 0   triamterene-hydrochlorothiazide (MAXZIDE-25) 37.5-25 MG tablet Take 0.5 tablets daily by mouth. 90 tablet 3   TRUE METRIX BLOOD GLUCOSE TEST test strip  (Patient not taking: Reported on 08/11/2020)     TRUEPLUS LANCETS 33G MISC  (Patient not taking: Reported on 08/11/2020)  venlafaxine XR (EFFEXOR-XR) 150 MG 24 hr capsule Take 2 capsules (300 mg total) by mouth daily with breakfast. 180 capsule 0   No facility-administered medications prior to visit.      Review of Systems  Review of Systems   Physical Exam  LMP 06/25/2011  Physical Exam   Lab Results:  CBC    Component Value Date/Time   WBC 6.4 08/12/2016 1659   WBC 10.6 (H) 03/13/2016 1720   RBC 4.53 08/12/2016 1659   RBC 4.87 03/13/2016 1720   HGB 14.2 08/12/2016 1659   HGB 13.7 08/20/2014 1117   HCT 41.9 08/12/2016 1659   HCT 40.3 08/20/2014 1117   PLT 230 08/12/2016 1659   MCV 93 08/12/2016 1659   MCV 94.1 08/20/2014 1117   MCH 31.3 08/12/2016 1659   MCH 30.6 03/13/2016 1720   MCHC 33.9 08/12/2016 1659   MCHC 34.3 03/13/2016 1720   RDW 14.1 08/12/2016 1659   RDW 13.2 08/20/2014 1117   LYMPHSABS 2.4 03/13/2016 1720   LYMPHSABS 2.0 08/20/2014 1117   MONOABS 0.9 03/13/2016 1720   MONOABS 0.5 08/20/2014 1117   EOSABS 0.0 03/13/2016 1720   EOSABS 0.1 08/20/2014 1117   BASOSABS 0.0 03/13/2016 1720   BASOSABS 0.1 08/20/2014 1117    BMET    Component Value Date/Time   NA 140 11/25/2017 1155   NA 138 08/20/2014 1117   K 4.3 11/25/2017 1155   K 4.7 08/20/2014  1117   CL 98 11/25/2017 1155   CL 104 06/29/2012 1221   CO2 27 11/25/2017 1155   CO2 26 08/20/2014 1117   GLUCOSE 84 11/25/2017 1155   GLUCOSE 82 03/13/2016 1720   GLUCOSE 92 08/20/2014 1117   GLUCOSE 107 (H) 06/29/2012 1221   BUN 17 11/25/2017 1155   BUN 16.0 08/20/2014 1117   CREATININE 0.93 11/25/2017 1155   CREATININE 0.9 08/20/2014 1117   CALCIUM 9.8 11/25/2017 1155   CALCIUM 9.4 08/20/2014 1117   GFRNONAA 68 11/25/2017 1155   GFRAA 78 11/25/2017 1155    BNP No results found for: BNP  ProBNP No results found for: PROBNP  Imaging: No results found.   Assessment & Plan:   No problem-specific Assessment & Plan notes found for this encounter.     Martyn Ehrich, NP 09/08/2020

## 2020-09-23 ENCOUNTER — Ambulatory Visit: Payer: Medicare HMO

## 2020-10-06 ENCOUNTER — Ambulatory Visit: Payer: Medicare HMO | Admitting: Primary Care

## 2020-10-06 ENCOUNTER — Telehealth: Payer: Self-pay | Admitting: Internal Medicine

## 2020-10-06 NOTE — Telephone Encounter (Signed)
Pt has not been seen in a little over a year. Pt does have an upcoming appt scheduled with BW 9/16. Called and spoke with pt to see if she was completely out of her inhaler or if she had enough to last her until her appt and pt said that she did have enough to last until then.  Stated to pt at her appt we could take care of any med refills for her then and she verbalized understanding. Nothing further needed.

## 2020-10-10 ENCOUNTER — Ambulatory Visit: Payer: Medicare HMO | Admitting: Primary Care

## 2020-10-21 ENCOUNTER — Ambulatory Visit: Payer: Medicare HMO | Admitting: Primary Care

## 2020-10-27 NOTE — Progress Notes (Signed)
$'@Patient'W$  ID: [name], female    DOB: 05/25/1960, 60 y.o.   MRN: [id number]  Chief Complaint  Patient presents with   Follow-up    F/U on COPD. States her breathing has been stable since last visit. Needs a refill on her Advair inhaler.     Referring provider: Harrison Mons, PA  HPI: 60 year old female, current everyday smoker.  Past medical history significant for COPD, HTN, GERD, chronic pain syndrome, fibromyalgia, hx breast cancer, prediabetes. Patient of Dr. Melvyn Novas, last seen by pulmonary NP on 09/25/19. Normal PFTs.   10/28/2020 Patient presents today for overdue follow-up. During last visit she was started on Airduo 113 (fluticasone-salmeterol) in place of Symbicort 160. She is doing well today, no acute complaints. She has a chronic cough which is occasionally productive. No issues affording current maintenance inhaler, needing refill. She is still smoking.   Dyspnea: With hills/inclines  Cough: Varies, occasionally productive SABA: Rare Nocturnal symptoms: None   Allergies  Allergen Reactions   Contrast Media [Iodinated Diagnostic Agents] Anaphylaxis    Anaphylactic shock.   Iodine Shortness Of Breath   Ezetimibe Other (See Comments)    "I can't take any cholesterol medicines." "I can't take any cholesterol medicines."    Statins Other (See Comments)    Body aches, constipation AND DEPRESSION Body aches, constipation Body aches, constipation AND DEPRESSION   Sulfa Antibiotics Nausea And Vomiting   Adhesive [Tape] Other (See Comments)    Reaction:  Blisters    Celecoxib     "I climb the walls"   Augmentin [Amoxicillin-Pot Clavulanate] Rash and Other (See Comments)    Has patient had a PCN reaction causing immediate rash, facial/tongue/throat swelling, SOB or lightheadedness with hypotension: No Has patient had a PCN reaction causing severe rash involving mucus membranes or skin necrosis: No Has patient had a PCN reaction that required hospitalization No Has  patient had a PCN reaction occurring within the last 10 years: No If all of the above answers are "NO", then may proceed with Cephalosporin use.   Ceclor [Cefaclor] Rash   Moxifloxacin Rash   Sulfonamide Derivatives Nausea And Vomiting    Immunization History  Administered Date(s) Administered   Influenza, High Dose Seasonal PF 11/27/2018, 01/29/2019   Influenza, Seasonal, Injecte, Preservative Fre 10/25/2017, 11/27/2018   Influenza,inj,Quad PF,6+ Mos 10/12/2016, 10/25/2017, 11/27/2018   Influenza-Unspecified 11/24/2019   Moderna Sars-Covid-2 Vaccination 04/17/2019, 05/15/2019, 11/27/2019   Pneumococcal Polysaccharide-23 11/25/2005   Pneumococcal-Unspecified 11/25/2005   Rabies, IM 03/13/2016, 03/16/2016, 03/20/2016, 03/27/2016   Tdap 03/13/2016   Zoster Recombinat (Shingrix) 10/19/2019, 12/24/2019    Past Medical History:  Diagnosis Date   Allergy    pollen and environmental   Anxiety    Arthritis    Asthma    Bipolar 1 disorder ([place])    Bipolar 1 disorder ([place])    Breast cancer ([place])    Cataract    COPD (chronic obstructive pulmonary disease) ([place])    Depression    Dysrhythmia    palpitations   Dysrhythmia    atrial fibrillation   Fibromyalgia    Finger fracture 2011   GERD (gastroesophageal reflux disease)    Heart murmur    History of stress test 04/28/2011   showed normal perfusion without scar or ischemia   Hx of echocardiogram 04/28/2011   showed normal systolic function with mild diastolic dysfunction,she had trace MR but did not have frank mitral valve prolapse demonstrated. She had mild pulmonary hypertension with an estimated RV systolic pressure at 34  mm.   Hyperlipemia    Meniere disease    MVP (mitral valve prolapse)    Neuromuscular disorder (HCC)    fibromyalgia    Tobacco History: Social History   Tobacco Use  Smoking Status Every Day   Packs/day: 1.50   Years: 40.00   Pack years: 60.00   Types: Cigarettes  Smokeless Tobacco Never   Tobacco Comments   Cannot afford chantix. Working on getting discount from drug compny   Ready to quit: Not Answered Counseling given: Not Answered Tobacco comments: Cannot afford chantix. Working on getting discount from drug compny   Outpatient Medications Prior to Visit  Medication Sig Dispense Refill   albuterol (VENTOLIN HFA) 108 (90 Base) MCG/ACT inhaler Inhale 2 puffs into the lungs every 4 (four) hours as needed for wheezing or shortness of breath (cough, shortness of breath or wheezing.). 8 g 11   Alcohol Swabs (B-D SINGLE USE SWABS REGULAR) PADS      ARIPiprazole (ABILIFY) 5 MG tablet Take 1 tablet (5 mg total) by mouth daily. 90 tablet 1   atenolol (TENORMIN) 50 MG tablet Take 50 mg in the morning and 25 mg in the evening 135 tablet 1   Biotin 5 MG CAPS Take 5 mg by mouth daily.     blood glucose meter kit and supplies KIT Dispense based on patient and insurance preference. Use daily as directed. (FOR ICD-9 250.00, 250.01). 1 each 0   calcium-vitamin D (OSCAL WITH D) 500-200 MG-UNIT tablet Take 1 tablet by mouth.     cetirizine (ZYRTEC) 10 MG tablet Take 10 mg by mouth daily.     diclofenac sodium (VOLTAREN) 1 % GEL daily as needed.     fluticasone (FLONASE) 50 MCG/ACT nasal spray USE 1 SPRAY IN EACH NOSTRIL TWICE DAILY 48 g 1   HYDROcodone-acetaminophen (NORCO/VICODIN) 5-325 MG tablet take 1 TABLET BY MOUTH EVERY 6 HOURS AS NEEDED for moderate pain 120 tablet 0   L-Methylfolate 15 MG TABS Take 1 tablet (15 mg total) by mouth daily. 90 tablet 1   lamoTRIgine (LAMICTAL) 200 MG tablet TAKE 1/2 TABLET EVERY MORNING  AND TAKE 2 TABLETS AT BEDTIME 225 tablet 1   Magnesium 500 MG TABS Take 250 mg by mouth daily.     meclizine (ANTIVERT) 25 MG tablet Take 1 tablet (25 mg total) by mouth as needed for dizziness. 30 tablet 1   meloxicam (MOBIC) 15 MG tablet TAKE 1 TABLET BY MOUTH ONCE A DAY AS NEEDED INFLAMMATION     methocarbamol (ROBAXIN) 500 MG tablet TAKE 1 TABLET BY MOUTH EVERY 6  TO 8 HOURS AS NEEDED FOR SPASMS/MUSCLE TENSION     montelukast (SINGULAIR) 10 MG tablet Take 1 tablet (10 mg total) at bedtime by mouth. 90 tablet 3   pantoprazole (PROTONIX) 40 MG tablet Take 1 tablet (40 mg total) by mouth daily. 90 tablet 1   pramipexole (MIRAPEX) 0.125 MG tablet TAKE 1 TABLET 2-3 HOURS BEFORE BEDTIME, THEN MAY INCREASE TO 2 TABLETS AT BEDTIME AFTER 4-7 DAYS AS TOLERATED 180 tablet 1   REPATHA SURECLICK 132 MG/ML SOAJ INJECT 140 MG INTO THE SKIN EVERY 14 DAYS 2 mL 11   traZODone (DESYREL) 100 MG tablet Take 2 tablets (200 mg total) by mouth at bedtime. 180 tablet 1   triamcinolone cream (KENALOG) 0.1 % Apply 1 application topically 2 (two) times daily. 30 g 0   triamterene-hydrochlorothiazide (MAXZIDE-25) 37.5-25 MG tablet Take 0.5 tablets daily by mouth. 90 tablet 3   TRUE METRIX  BLOOD GLUCOSE TEST test strip      TRUEPLUS LANCETS 33G MISC      venlafaxine XR (EFFEXOR-XR) 150 MG 24 hr capsule Take 2 capsules (300 mg total) by mouth daily with breakfast. 180 capsule 0   Fluticasone-Salmeterol 113-14 MCG/ACT AEPB inhale 1 PUFF into THE lungs 2 TIMES DAILY 1 each 0   ezetimibe (ZETIA) 10 MG tablet Take 1 tablet (10 mg total) by mouth daily. 30 tablet 5   No facility-administered medications prior to visit.    Review of Systems  Review of Systems  Constitutional: Negative.   HENT:  Positive for congestion.   Respiratory:  Positive for cough. Negative for chest tightness and wheezing.        DOE    Physical Exam  BP 120/82 (BP Location: Left Arm, Patient Position: Sitting, Cuff Size: Normal)   Pulse 68   Ht 5' 2"  (1.575 m)   Wt 172 lb (78 kg)   LMP 06/25/2011   SpO2 98% Comment: on RA  BMI 31.46 kg/m  Physical Exam Constitutional:      Appearance: Normal appearance.  HENT:     Head: Normocephalic and atraumatic.     Mouth/Throat:     Mouth: Mucous membranes are moist.     Pharynx: Oropharynx is clear.  Cardiovascular:     Rate and Rhythm: Normal rate and  regular rhythm.  Pulmonary:     Effort: Pulmonary effort is normal.     Breath sounds: Normal breath sounds.  Musculoskeletal:        General: Normal range of motion.  Skin:    General: Skin is warm and dry.  Neurological:     General: No focal deficit present.     Mental Status: She is alert and oriented to person, place, and time. Mental status is at baseline.  Psychiatric:        Mood and Affect: Mood normal.        Behavior: Behavior normal.        Thought Content: Thought content normal.        Judgment: Judgment normal.     Lab Results:  CBC    Component Value Date/Time   WBC 6.4 08/12/2016 1659   WBC 10.6 (H) 03/13/2016 1720   RBC 4.53 08/12/2016 1659   RBC 4.87 03/13/2016 1720   HGB 14.2 08/12/2016 1659   HGB 13.7 08/20/2014 1117   HCT 41.9 08/12/2016 1659   HCT 40.3 08/20/2014 1117   PLT 230 08/12/2016 1659   MCV 93 08/12/2016 1659   MCV 94.1 08/20/2014 1117   MCH 31.3 08/12/2016 1659   MCH 30.6 03/13/2016 1720   MCHC 33.9 08/12/2016 1659   MCHC 34.3 03/13/2016 1720   RDW 14.1 08/12/2016 1659   RDW 13.2 08/20/2014 1117   LYMPHSABS 2.4 03/13/2016 1720   LYMPHSABS 2.0 08/20/2014 1117   MONOABS 0.9 03/13/2016 1720   MONOABS 0.5 08/20/2014 1117   EOSABS 0.0 03/13/2016 1720   EOSABS 0.1 08/20/2014 1117   BASOSABS 0.0 03/13/2016 1720   BASOSABS 0.1 08/20/2014 1117    BMET    Component Value Date/Time   NA 140 11/25/2017 1155   NA 138 08/20/2014 1117   K 4.3 11/25/2017 1155   K 4.7 08/20/2014 1117   CL 98 11/25/2017 1155   CL 104 06/29/2012 1221   CO2 27 11/25/2017 1155   CO2 26 08/20/2014 1117   GLUCOSE 84 11/25/2017 1155   GLUCOSE 82 03/13/2016 1720   GLUCOSE 92 08/20/2014  1117   GLUCOSE 107 (H) 06/29/2012 1221   BUN 17 11/25/2017 1155   BUN 16.0 08/20/2014 1117   CREATININE 0.93 11/25/2017 1155   CREATININE 0.9 08/20/2014 1117   CALCIUM 9.8 11/25/2017 1155   CALCIUM 9.4 08/20/2014 1117   GFRNONAA 68 11/25/2017 1155   GFRAA 78 11/25/2017  1155    BNP No results found for: BNP  ProBNP No results found for: PROBNP  Imaging: No results found.   Assessment & Plan:   Chronic obstructive pulmonary disease ([place]) - Stable interval; No recent exacerbations. Chronic cough and dyspnea with hills/inclines. CAT 14 - Continue Fluticasone-salmeterol 1 puff morning and evening (rinse mouth after use) - Continue Motelukast (Singulair) 65m at bedtime  - Take Mucinex [id number] a day as needed for chest congestion and advised she look into getting flutter valve online  - Follow-up 6 months with BeEustaquio MaizeP   Cigarette smoker - Patient is still smoking, cessation encouraged  - Refer lung cancer screening program    ElMartyn EhrichNP 10/28/2020

## 2020-10-28 ENCOUNTER — Other Ambulatory Visit: Payer: Self-pay

## 2020-10-28 ENCOUNTER — Ambulatory Visit: Payer: Medicare HMO | Admitting: Primary Care

## 2020-10-28 ENCOUNTER — Encounter: Payer: Self-pay | Admitting: Primary Care

## 2020-10-28 DIAGNOSIS — J449 Chronic obstructive pulmonary disease, unspecified: Secondary | ICD-10-CM

## 2020-10-28 DIAGNOSIS — Z79899 Other long term (current) drug therapy: Secondary | ICD-10-CM | POA: Diagnosis not present

## 2020-10-28 DIAGNOSIS — F17210 Nicotine dependence, cigarettes, uncomplicated: Secondary | ICD-10-CM

## 2020-10-28 MED ORDER — FLUTICASONE-SALMETEROL 113-14 MCG/ACT IN AEPB: INHALATION_SPRAY | RESPIRATORY_TRACT | 11 refills | Status: DC

## 2020-10-28 MED ORDER — GUAIFENESIN ER 600 MG PO TB12: 600.0000 mg | ORAL_TABLET | Freq: Two times a day (BID) | ORAL | 3 refills | Status: DC | PRN

## 2020-10-28 NOTE — Assessment & Plan Note (Addendum)
-   Patient is still smoking, cessation encouraged  - Refer lung cancer screening program

## 2020-10-28 NOTE — Patient Instructions (Signed)
Nice seeing you today Anita Arroyo  Recommendations: - Continue Fluticasone-salmeterol 1 puff morning and evening (rinse mouth after use) - Continue Motelukast (Singulair) 10mg  at bedtime  - Take Mucinex 600mg  twice a day as needed for chest congestion  - Look into getting flutter valve on [employer] (Also called acapella device) - there are educational videos online to show you how to use this   Follow-up: - 6 months with Eustaquio Maize NP

## 2020-10-28 NOTE — Assessment & Plan Note (Addendum)
-   Stable interval; No recent exacerbations. Chronic cough and dyspnea with hills/inclines. CAT 14 - Continue Fluticasone-salmeterol 1 puff morning and evening (rinse mouth after use) - Continue Motelukast (Singulair) 10mg  at bedtime  - Take Mucinex 600mg  twice a day as needed for chest congestion and advised she look into getting flutter valve online  - Follow-up 6 months with Eustaquio Maize NP

## 2020-10-29 ENCOUNTER — Other Ambulatory Visit: Payer: Self-pay | Admitting: Psychiatry

## 2020-10-29 DIAGNOSIS — F3162 Bipolar disorder, current episode mixed, moderate: Secondary | ICD-10-CM

## 2020-10-31 ENCOUNTER — Other Ambulatory Visit: Payer: Self-pay

## 2020-10-31 ENCOUNTER — Ambulatory Visit
Admission: RE | Admit: 2020-10-31 | Discharge: 2020-10-31 | Disposition: A | Discharge status: Home / Self Care | Payer: Medicare HMO | Source: Ambulatory Visit | Attending: Physician Assistant | Admitting: Physician Assistant

## 2020-10-31 DIAGNOSIS — Z1231 Encounter for screening mammogram for malignant neoplasm of breast: Secondary | ICD-10-CM

## 2020-12-09 ENCOUNTER — Other Ambulatory Visit: Payer: Self-pay | Admitting: Cardiovascular Disease

## 2021-01-16 ENCOUNTER — Other Ambulatory Visit: Payer: Self-pay | Admitting: Psychiatry

## 2021-01-16 DIAGNOSIS — F3162 Bipolar disorder, current episode mixed, moderate: Secondary | ICD-10-CM

## 2021-01-22 ENCOUNTER — Other Ambulatory Visit: Payer: Self-pay | Admitting: Psychiatry

## 2021-01-22 DIAGNOSIS — F3162 Bipolar disorder, current episode mixed, moderate: Secondary | ICD-10-CM

## 2021-01-22 DIAGNOSIS — G2581 Restless legs syndrome: Secondary | ICD-10-CM

## 2021-01-22 DIAGNOSIS — F5101 Primary insomnia: Secondary | ICD-10-CM

## 2021-01-22 NOTE — Telephone Encounter (Signed)
90 day ok?

## 2021-01-27 ENCOUNTER — Other Ambulatory Visit: Payer: Self-pay | Admitting: Primary Care

## 2021-01-29 ENCOUNTER — Encounter: Payer: Self-pay | Admitting: Psychiatry

## 2021-01-29 ENCOUNTER — Ambulatory Visit (INDEPENDENT_AMBULATORY_CARE_PROVIDER_SITE_OTHER): Payer: Medicare HMO | Admitting: Psychiatry

## 2021-01-29 DIAGNOSIS — G2581 Restless legs syndrome: Secondary | ICD-10-CM

## 2021-01-29 DIAGNOSIS — F5101 Primary insomnia: Secondary | ICD-10-CM | POA: Diagnosis not present

## 2021-01-29 DIAGNOSIS — F3162 Bipolar disorder, current episode mixed, moderate: Secondary | ICD-10-CM

## 2021-01-29 MED ORDER — TRAZODONE HCL 100 MG PO TABS: 200.0000 mg | ORAL_TABLET | Freq: Every day | ORAL | 1 refills | Status: DC

## 2021-01-29 MED ORDER — ARIPIPRAZOLE 5 MG PO TABS: 5.0000 mg | ORAL_TABLET | Freq: Every day | ORAL | 1 refills | Status: DC

## 2021-01-29 MED ORDER — PRAMIPEXOLE DIHYDROCHLORIDE 0.125 MG PO TABS: ORAL_TABLET | ORAL | 1 refills | Status: DC

## 2021-01-29 MED ORDER — VENLAFAXINE HCL ER 150 MG PO CP24: ORAL_CAPSULE | ORAL | 1 refills | Status: DC

## 2021-01-29 MED ORDER — LAMOTRIGINE 200 MG PO TABS: ORAL_TABLET | ORAL | 1 refills | Status: DC

## 2021-01-29 NOTE — Progress Notes (Signed)
[name] [id number] 14-May-1960 61 y.o.  Virtual Visit via Telephone Note  I connected with pt on 01/29/21 at  2:00 PM EST by telephone and verified that I am speaking with the correct person using two identifiers.   I discussed the limitations, risks, security and privacy concerns of performing an evaluation and management service by telephone and the availability of in person appointments. I also discussed with the patient that there may be a patient responsible charge related to this service. The patient expressed understanding and agreed to proceed.   I discussed the assessment and treatment plan with the patient. The patient was provided an opportunity to ask questions and all were answered. The patient agreed with the plan and demonstrated an understanding of the instructions.   The patient was advised to call back or seek an in-person evaluation if the symptoms worsen or if the condition fails to improve as anticipated.  I provided 25 minutes of non-face-to-face time during this encounter.  The patient was located at home.  The provider was located at [place].   Thayer Headings, PMHNP   Subjective:   Patient ID:  Anita Arroyo is a 61 y.o. (DOB August 03, 1960) female.  Chief Complaint:  Chief Complaint  Patient presents with   Follow-up    Depression, anxiety    HPI Anita Arroyo presents for follow-up of anxiety and depression. Mother had knee replacement surgery and she has been caring for her. She has had disrupted sleep while staying with her mother. She reports that her sleep was ok before she went to her mother's. She reports that her mood has been ok. Denies any recent anxiety. She reports that she has been tired with increased activities (taking care of mother, helping a friend pack and move from a large house, etc). "It's a good tired" that she attributes to being productive. Denies depressed. She reports weight gain and that this has made her feel uncomfortable in  public. Denies bored eating. Concentration has been adequate. Denies SI.   Denies impulsive spending.   She reports that her debit card had to be stopped due to unauthorized charges and is awaiting a new card. She reports that she has been inconvenienced.  Reports that RLS are well controlled with Pramipexole.   Review of Systems:  Review of Systems  Gastrointestinal:  Positive for abdominal distention.  Musculoskeletal:  Negative for gait problem.  Neurological:  Negative for tremors.  Psychiatric/Behavioral:         Please refer to HPI   Medications: I have reviewed the patient's current medications.  Current Outpatient Medications  Medication Sig Dispense Refill   albuterol (VENTOLIN HFA) 108 (90 Base) MCG/ACT inhaler Inhale 2 puffs into the lungs every 4 (four) hours as needed for wheezing or shortness of breath (cough, shortness of breath or wheezing.). 8 g 11   atenolol (TENORMIN) 50 MG tablet Take 50 mg in the morning and 25 mg in the evening 135 tablet 1   Biotin 5 MG CAPS Take 5 mg by mouth daily.     calcium-vitamin D (OSCAL WITH D) 500-200 MG-UNIT tablet Take 1 tablet by mouth.     cetirizine (ZYRTEC) 10 MG tablet Take 10 mg by mouth daily.     diclofenac sodium (VOLTAREN) 1 % GEL daily as needed.     fluticasone (FLONASE) 50 MCG/ACT nasal spray USE 1 SPRAY IN EACH NOSTRIL TWICE DAILY 48 g 1   Fluticasone-Salmeterol 113-14 MCG/ACT AEPB Inhale 1 PUFF into the  lungs 2 TIMES DAILY 1 each 11   GNP MUCUS ER 600 MG 12 hr tablet TAKE 1 TABLET BY MOUTH 2 TIMES DAILY AS NEEDED 60 tablet 3   HYDROcodone-acetaminophen (NORCO/VICODIN) 5-325 MG tablet take 1 TABLET BY MOUTH EVERY 6 HOURS AS NEEDED for moderate pain 120 tablet 0   L-Methylfolate 15 MG TABS Take 1 tablet (15 mg total) by mouth daily. 90 tablet 1   Magnesium 500 MG TABS Take 250 mg by mouth daily.     meclizine (ANTIVERT) 25 MG tablet Take 1 tablet (25 mg total) by mouth as needed for dizziness. 30 tablet 1   meloxicam  (MOBIC) 15 MG tablet TAKE 1 TABLET BY MOUTH ONCE A DAY AS NEEDED INFLAMMATION     methocarbamol (ROBAXIN) 500 MG tablet TAKE 1 TABLET BY MOUTH EVERY 6 TO 8 HOURS AS NEEDED FOR SPASMS/MUSCLE TENSION     montelukast (SINGULAIR) 10 MG tablet Take 1 tablet (10 mg total) at bedtime by mouth. 90 tablet 3   pantoprazole (PROTONIX) 40 MG tablet Take 1 tablet (40 mg total) by mouth daily. 90 tablet 1   REPATHA SURECLICK 626 MG/ML SOAJ INJECT 140MG INTO THE SKIN EVERY 14 DAYS 2 mL 11   triamcinolone cream (KENALOG) 0.1 % Apply 1 application topically 2 (two) times daily. 30 g 0   triamterene-hydrochlorothiazide (MAXZIDE-25) 37.5-25 MG tablet Take 0.5 tablets daily by mouth. 90 tablet 3   Alcohol Swabs (B-D SINGLE USE SWABS REGULAR) PADS      ARIPiprazole (ABILIFY) 5 MG tablet Take 1 tablet (5 mg total) by mouth daily. 90 tablet 1   blood glucose meter kit and supplies KIT Dispense based on patient and insurance preference. Use daily as directed. (FOR ICD-9 250.00, 250.01). 1 each 0   lamoTRIgine (LAMICTAL) 200 MG tablet TAKE 1/2 TABLET EVERY MORNING  AND TAKE 2 TABLETS AT BEDTIME 225 tablet 1   pramipexole (MIRAPEX) 0.125 MG tablet TAKE 1 TABLET 2 TO 3 HOURS BEFORE BEDTIME, THEN MAY INCREASE TO 2 TABLETS AT BEDTIME AFTER 4 TO 7 DAYS AS TOLERATED 180 tablet 1   traZODone (DESYREL) 100 MG tablet Take 2 tablets (200 mg total) by mouth at bedtime. 180 tablet 1   TRUE METRIX BLOOD GLUCOSE TEST test strip      TRUEPLUS LANCETS 33G MISC      venlafaxine XR (EFFEXOR-XR) 150 MG 24 hr capsule TAKE 2 CAPSULES EVERY DAY WITH BREAKFAST 180 capsule 1   No current facility-administered medications for this visit.    Medication Side Effects: None  Allergies:  Allergies  Allergen Reactions   Contrast Media [Iodinated Contrast Media] Anaphylaxis    Anaphylactic shock.   Iodine Shortness Of Breath   Ezetimibe Other (See Comments)    "I can't take any cholesterol medicines." "I can't take any cholesterol  medicines."    Statins Other (See Comments)    Body aches, constipation AND DEPRESSION Body aches, constipation Body aches, constipation AND DEPRESSION   Sulfa Antibiotics Nausea And Vomiting   Adhesive [Tape] Other (See Comments)    Reaction:  Blisters    Celecoxib     "I climb the walls"   Augmentin [Amoxicillin-Pot Clavulanate] Rash and Other (See Comments)    Has patient had a PCN reaction causing immediate rash, facial/tongue/throat swelling, SOB or lightheadedness with hypotension: No Has patient had a PCN reaction causing severe rash involving mucus membranes or skin necrosis: No Has patient had a PCN reaction that required hospitalization No Has patient had a PCN reaction  occurring within the last 10 years: No If all of the above answers are "NO", then may proceed with Cephalosporin use.   Ceclor [Cefaclor] Rash   Moxifloxacin Rash   Sulfonamide Derivatives Nausea And Vomiting    Past Medical History:  Diagnosis Date   Allergy    pollen and environmental   Anxiety    Arthritis    Asthma    Bipolar 1 disorder (HCC)    Bipolar 1 disorder (HCC)    Breast cancer ([facility])    Cataract    COPD (chronic obstructive pulmonary disease) (HCC)    Depression    Dysrhythmia    palpitations   Dysrhythmia    atrial fibrillation   Fibromyalgia    Finger fracture 2011   GERD (gastroesophageal reflux disease)    Heart murmur    History of stress test 04/28/2011   showed normal perfusion without scar or ischemia   Hx of echocardiogram 04/28/2011   showed normal systolic function with mild diastolic dysfunction,she had trace MR but did not have frank mitral valve prolapse demonstrated. She had mild pulmonary hypertension with an estimated RV systolic pressure at 34 mm.   Hyperlipemia    Meniere disease    MVP (mitral valve prolapse)    Neuromuscular disorder (HCC)    fibromyalgia    Family History  Problem Relation Age of Onset   Breast cancer Mother        biological mother    Mitral valve prolapse Mother    Heart Problems Mother    Cancer Mother    Colon cancer Maternal Grandfather    Esophageal cancer Neg Hx    Rectal cancer Neg Hx    Stomach cancer Neg Hx     Social History   Socioeconomic History   Marital status: Single    Spouse name: Not on file   Number of children: Not on file   Years of education: Not on file   Highest education level: Not on file  Occupational History   Not on file  Tobacco Use   Smoking status: Every Day    Packs/day: 1.50    Years: 40.00    Pack years: 60.00    Types: Cigarettes   Smokeless tobacco: Never   Tobacco comments:    Cannot afford chantix. Working on getting discount from drug compny  Vaping Use   Vaping Use: Some days   Start date: 06/23/2020   Substances: Nicotine  Substance and Sexual Activity   Alcohol use: Not Currently    Comment: less than monthly   Drug use: No   Sexual activity: Not on file  Other Topics Concern   Not on file  Social History Narrative   Not on file   Social Determinants of Health   Financial Resource Strain: Not on file  Food Insecurity: Not on file  Transportation Needs: Not on file  Physical Activity: Not on file  Stress: Not on file  Social Connections: Not on file  Intimate Partner Violence: Not on file    Past Medical History, Surgical history, Social history, and Family history were reviewed and updated as appropriate.   Please see review of systems for further details on the patient's review from today.   Objective:   Physical Exam:  LMP 06/25/2011   Physical Exam Neurological:     Mental Status: She is alert and oriented to person, place, and time.     Cranial Nerves: No dysarthria.  Psychiatric:  Attention and Perception: Attention and perception normal.        Mood and Affect: Mood normal.        Speech: Speech normal.        Behavior: Behavior is cooperative.        Thought Content: Thought content normal. Thought content is not  paranoid or delusional. Thought content does not include homicidal or suicidal ideation. Thought content does not include homicidal or suicidal plan.        Cognition and Memory: Cognition and memory normal.        Judgment: Judgment normal.     Comments: Insight intact    Lab Review:     Component Value Date/Time   NA 140 11/25/2017 1155   NA 138 08/20/2014 1117   K 4.3 11/25/2017 1155   K 4.7 08/20/2014 1117   CL 98 11/25/2017 1155   CL 104 06/29/2012 1221   CO2 27 11/25/2017 1155   CO2 26 08/20/2014 1117   GLUCOSE 84 11/25/2017 1155   GLUCOSE 82 03/13/2016 1720   GLUCOSE 92 08/20/2014 1117   GLUCOSE 107 (H) 06/29/2012 1221   BUN 17 11/25/2017 1155   BUN 16.0 08/20/2014 1117   CREATININE 0.93 11/25/2017 1155   CREATININE 0.9 08/20/2014 1117   CALCIUM 9.8 11/25/2017 1155   CALCIUM 9.4 08/20/2014 1117   PROT 6.7 11/25/2017 1155   PROT 6.6 08/20/2014 1117   ALBUMIN 4.7 11/25/2017 1155   ALBUMIN 3.9 08/20/2014 1117   AST 21 11/25/2017 1155   AST 22 08/20/2014 1117   ALT 21 11/25/2017 1155   ALT 18 08/20/2014 1117   ALKPHOS 109 11/25/2017 1155   ALKPHOS 54 08/20/2014 1117   BILITOT <0.2 11/25/2017 1155   BILITOT 0.47 08/20/2014 1117   GFRNONAA 68 11/25/2017 1155   GFRAA 78 11/25/2017 1155       Component Value Date/Time   WBC 6.4 08/12/2016 1659   WBC 10.6 (H) 03/13/2016 1720   RBC 4.53 08/12/2016 1659   RBC 4.87 03/13/2016 1720   HGB 14.2 08/12/2016 1659   HGB 13.7 08/20/2014 1117   HCT 41.9 08/12/2016 1659   HCT 40.3 08/20/2014 1117   PLT 230 08/12/2016 1659   MCV 93 08/12/2016 1659   MCV 94.1 08/20/2014 1117   MCH 31.3 08/12/2016 1659   MCH 30.6 03/13/2016 1720   MCHC 33.9 08/12/2016 1659   MCHC 34.3 03/13/2016 1720   RDW 14.1 08/12/2016 1659   RDW 13.2 08/20/2014 1117   LYMPHSABS 2.4 03/13/2016 1720   LYMPHSABS 2.0 08/20/2014 1117   MONOABS 0.9 03/13/2016 1720   MONOABS 0.5 08/20/2014 1117   EOSABS 0.0 03/13/2016 1720   EOSABS 0.1 08/20/2014 1117    BASOSABS 0.0 03/13/2016 1720   BASOSABS 0.1 08/20/2014 1117    No results found for: POCLITH, LITHIUM   No results found for: PHENYTOIN, PHENOBARB, VALPROATE, CBMZ   .res Assessment: Plan:    Patient seen for 25 minutes and time spent reviewing recent symptoms and response to situational stressors.  She reports that overall mood remains stable and anxiety has been manageable.  She denies any tolerability issues.  We will therefore continue current medications without changes at this time. Continue Abilify 5 mg daily for depression. Continue lamotrigine 100 mg in the morning and 400 mg at bedtime   For mood signs and symptoms. Continue pramipexole for restless legs. Continue trazodone 200 mg at bedtime for insomnia. Continue Effexor XR 300 mg daily for mood and anxiety signs and  symptoms. Patient to follow-up with this provider in 6 months or sooner if clinically indicated. Patient advised to contact office with any questions, adverse effects, or acute worsening in signs and symptoms.   Anita Arroyo was seen today for follow-up.  Diagnoses and all orders for this visit:  Bipolar 1 disorder, mixed, moderate (HCC) -     ARIPiprazole (ABILIFY) 5 MG tablet; Take 1 tablet (5 mg total) by mouth daily. -     lamoTRIgine (LAMICTAL) 200 MG tablet; TAKE 1/2 TABLET EVERY MORNING  AND TAKE 2 TABLETS AT BEDTIME -     venlafaxine XR (EFFEXOR-XR) 150 MG 24 hr capsule; TAKE 2 CAPSULES EVERY DAY WITH BREAKFAST  RLS (restless legs syndrome) -     pramipexole (MIRAPEX) 0.125 MG tablet; TAKE 1 TABLET 2 TO 3 HOURS BEFORE BEDTIME, THEN MAY INCREASE TO 2 TABLETS AT BEDTIME AFTER 4 TO 7 DAYS AS TOLERATED  Primary insomnia -     traZODone (DESYREL) 100 MG tablet; Take 2 tablets (200 mg total) by mouth at bedtime.    Please see After Visit Summary for patient specific instructions.  Future Appointments  Date Time Provider [place]  04/28/2021  2:00 PM Martyn Ehrich, NP [facility] None    No  orders of the defined types were placed in this encounter.     -------------------------------

## 2021-02-24 ENCOUNTER — Other Ambulatory Visit: Payer: Self-pay | Admitting: Acute Care

## 2021-02-24 DIAGNOSIS — F17210 Nicotine dependence, cigarettes, uncomplicated: Secondary | ICD-10-CM

## 2021-02-26 ENCOUNTER — Other Ambulatory Visit: Payer: Self-pay

## 2021-02-26 DIAGNOSIS — F3162 Bipolar disorder, current episode mixed, moderate: Secondary | ICD-10-CM

## 2021-02-27 MED ORDER — L-METHYLFOLATE 15 MG PO TABS: 1.0000 | ORAL_TABLET | Freq: Every day | ORAL | 1 refills | Status: DC

## 2021-03-03 ENCOUNTER — Telehealth: Payer: Self-pay | Admitting: Primary Care

## 2021-03-03 ENCOUNTER — Other Ambulatory Visit: Payer: Self-pay | Admitting: Primary Care

## 2021-03-03 MED ORDER — GUAIFENESIN ER 600 MG PO TB12: ORAL_TABLET | ORAL | 3 refills | Status: DC

## 2021-03-03 NOTE — Telephone Encounter (Signed)
Called and spoke with patient. She stated that she needed a refill on her guaifenesin 600mg  tablets. [facility] had requested them back in January 2023 and never heard back.   I advised her that I would go ahead and send in the refill for her. She verbalized understanding.   Nothing further needed at time of call.

## 2021-03-06 ENCOUNTER — Other Ambulatory Visit: Payer: Self-pay

## 2021-03-06 ENCOUNTER — Ambulatory Visit (INDEPENDENT_AMBULATORY_CARE_PROVIDER_SITE_OTHER)
Admission: RE | Admit: 2021-03-06 | Discharge: 2021-03-06 | Disposition: A | Discharge status: Home / Self Care | Payer: Medicare HMO | Source: Ambulatory Visit | Attending: Acute Care | Admitting: Acute Care

## 2021-03-06 DIAGNOSIS — F17210 Nicotine dependence, cigarettes, uncomplicated: Secondary | ICD-10-CM

## 2021-03-17 ENCOUNTER — Telehealth: Payer: Self-pay | Admitting: Acute Care

## 2021-03-17 DIAGNOSIS — Z87891 Personal history of nicotine dependence: Secondary | ICD-10-CM

## 2021-03-17 DIAGNOSIS — F17210 Nicotine dependence, cigarettes, uncomplicated: Secondary | ICD-10-CM

## 2021-03-17 NOTE — Telephone Encounter (Signed)
Spoke to patient. She is requesting CT results.   Sarah, please advise. Thanks

## 2021-03-17 NOTE — Telephone Encounter (Signed)
Spoke with pt and reviewed recent CT results. Advised pt that we will plan to repeat the CT in 6 months and will call her closer to that time to schedule. Pt verbalized understanding. Results sent to PCP with follow up plan included. Order placed for 6 mth nodule f/u CT.

## 2021-03-22 DIAGNOSIS — R918 Other nonspecific abnormal finding of lung field: Secondary | ICD-10-CM | POA: Insufficient documentation

## 2021-04-28 ENCOUNTER — Ambulatory Visit: Payer: Medicare HMO | Admitting: Primary Care

## 2021-05-05 ENCOUNTER — Ambulatory Visit: Payer: Medicare HMO | Admitting: Primary Care

## 2021-05-13 ENCOUNTER — Ambulatory Visit (INDEPENDENT_AMBULATORY_CARE_PROVIDER_SITE_OTHER): Payer: Medicare HMO | Admitting: Primary Care

## 2021-05-13 ENCOUNTER — Encounter: Payer: Self-pay | Admitting: Primary Care

## 2021-05-13 DIAGNOSIS — J449 Chronic obstructive pulmonary disease, unspecified: Secondary | ICD-10-CM | POA: Diagnosis not present

## 2021-05-13 DIAGNOSIS — J302 Other seasonal allergic rhinitis: Secondary | ICD-10-CM | POA: Diagnosis not present

## 2021-05-13 DIAGNOSIS — Z79899 Other long term (current) drug therapy: Secondary | ICD-10-CM

## 2021-05-13 MED ORDER — MONTELUKAST SODIUM 10 MG PO TABS: 10.0000 mg | ORAL_TABLET | Freq: Every day | ORAL | 3 refills | Status: AC

## 2021-05-13 MED ORDER — FLUTICASONE-SALMETEROL 113-14 MCG/ACT IN AEPB: INHALATION_SPRAY | RESPIRATORY_TRACT | 11 refills | Status: DC

## 2021-05-13 NOTE — Patient Instructions (Addendum)
? ?  Continue Fluticasone-salmeterol inhaler one puff morning and evening  ? ?Use albuterol rescue inhaler 2 puffs every 4-6 hours only as needed for breakthrough shortness of breath ? ?Continue Singulair '10mg'$  a bedtime  ? ?Continue mucinex for cough/chest congestion ? ?Aim to get 40-50 grams protein and 200-300 grams carbs a day  ? ?Congrats on weight loss!! Keep up your hard work  ? ?Follow-up: ?1 year with DR. Wert or sooner if needed  ?

## 2021-05-13 NOTE — Progress Notes (Signed)
$'@Patient'o$  ID: Anita Arroyo, female    DOB: 04-08-1960, 61 y.o.   MRN: [id number]  No chief complaint on file.   Referring provider: Harrison Mons, PA  HPI: 61 year old female, current everyday smoker.  Past medical history significant for COPD, HTN, GERD, chronic pain syndrome, fibromyalgia, hx breast cancer, prediabetes. Patient of Dr. Melvyn Novas, last seen by pulmonary NP on on 10/28/20. Normal PFTs.   Previous [facility] pulmonary encounter: 10/28/2020 Patient presents today for overdue follow-up. During last visit she was started on Airduo 113 (fluticasone-salmeterol) in place of Symbicort 160. She is doing well today, no acute complaints. She has a chronic cough which is occasionally productive. No issues affording current maintenance inhaler, needing refill. She is still smoking.   Dyspnea: With hills/inclines  Cough: Varies, occasionally productive SABA: Rare Nocturnal symptoms: None   05/13/2021 Patient presents today for 6 month follow-up. She is doing alright today. No acute complaints. He has a chronic cough, mucinex has helped tremendously. She is complaint with Fluticasone-salmeterol inhaler twice daily. She has not needed to use her albuterol rescue inhaler recently. She started weight watchers and has lost 20 lbs. Energy level is improving with weight loss. CAT score 12  Dyspnea: With inclines/hills Cough: Chronic productive cough with clear mucus, improved with mucinex SABA: Rare Nocturnal symptoms: None   Allergies  Allergen Reactions   Contrast Media [Iodinated Contrast Media] Anaphylaxis    Anaphylactic shock.   Iodine Shortness Of Breath   Ezetimibe Other (See Comments)    "I can't take any cholesterol medicines." "I can't take any cholesterol medicines."    Statins Other (See Comments)    Body aches, constipation AND DEPRESSION Body aches, constipation Body aches, constipation AND DEPRESSION   Sulfa Antibiotics Nausea And Vomiting   Adhesive [Tape] Other (See  Comments)    Reaction:  Blisters    Celecoxib     "I climb the walls"   Augmentin [Amoxicillin-Pot Clavulanate] Rash and Other (See Comments)    Has patient had a PCN reaction causing immediate rash, facial/tongue/throat swelling, SOB or lightheadedness with hypotension: No Has patient had a PCN reaction causing severe rash involving mucus membranes or skin necrosis: No Has patient had a PCN reaction that required hospitalization No Has patient had a PCN reaction occurring within the last 10 years: No If all of the above answers are "NO", then may proceed with Cephalosporin use.   Ceclor [Cefaclor] Rash   Moxifloxacin Rash   Sulfonamide Derivatives Nausea And Vomiting    Immunization History  Administered Date(s) Administered   Influenza, High Dose Seasonal PF 11/27/2018, 01/29/2019   Influenza, Seasonal, Injecte, Preservative Fre 10/25/2017, 11/27/2018   Influenza,inj,Quad PF,6+ Mos 10/12/2016, 10/25/2017, 11/27/2018   Influenza,inj,Quad PF,6-35 Mos 10/18/2020   Influenza-Unspecified 11/24/2019   Moderna Sars-Covid-2 Vaccination 04/17/2019, 05/15/2019, 11/27/2019, 07/22/2020   PNEUMOCOCCAL CONJUGATE-20 11/06/2020   Pfizer Covid-19 Vaccine Bivalent Booster 67yrs & up 10/23/2020   Pneumococcal Polysaccharide-23 11/25/2005   Pneumococcal-Unspecified 11/25/2005   Rabies, IM 03/13/2016, 03/16/2016, 03/20/2016, 03/27/2016   Tdap 03/13/2016   Zoster Recombinat (Shingrix) 10/19/2019, 12/24/2019, 03/14/2020    Past Medical History:  Diagnosis Date   Allergy    pollen and environmental   Anxiety    Arthritis    Asthma    Bipolar 1 disorder ([place])    Bipolar 1 disorder ([place])    Breast cancer ([place])    Cataract    COPD (chronic obstructive pulmonary disease) ([place])    Depression    Dysrhythmia    palpitations  Dysrhythmia    atrial fibrillation   Fibromyalgia    Finger fracture 2011   GERD (gastroesophageal reflux disease)    Heart murmur    History of stress test 04/28/2011    showed normal perfusion without scar or ischemia   Hx of echocardiogram 04/28/2011   showed normal systolic function with mild diastolic dysfunction,she had trace MR but did not have frank mitral valve prolapse demonstrated. She had mild pulmonary hypertension with an estimated RV systolic pressure at 34 mm.   Hyperlipemia    Meniere disease    MVP (mitral valve prolapse)    Neuromuscular disorder ([facility])    fibromyalgia    Tobacco History: Social History   Tobacco Use  Smoking Status Every Day   Packs/day: 1.50   Years: 40.00   Pack years: 60.00   Types: Cigarettes  Smokeless Tobacco Never  Tobacco Comments   Cannot afford chantix. Working on getting discount from drug compny   Ready to quit: Not Answered Counseling given: Not Answered Tobacco comments: Cannot afford chantix. Working on getting discount from drug compny   Outpatient Medications Prior to Visit  Medication Sig Dispense Refill   albuterol (VENTOLIN HFA) 108 (90 Base) MCG/ACT inhaler Inhale 2 puffs into the lungs every 4 (four) hours as needed for wheezing or shortness of breath (cough, shortness of breath or wheezing.). 8 g 11   Alcohol Swabs (B-D SINGLE USE SWABS REGULAR) PADS      ARIPiprazole (ABILIFY) 5 MG tablet Take 1 tablet (5 mg total) by mouth daily. 90 tablet 1   atenolol (TENORMIN) 50 MG tablet Take 50 mg in the morning and 25 mg in the evening 135 tablet 1   Biotin 5 MG CAPS Take 5 mg by mouth daily.     blood glucose meter kit and supplies KIT Dispense based on patient and insurance preference. Use daily as directed. (FOR ICD-9 250.00, 250.01). 1 each 0   calcium-vitamin D (OSCAL WITH D) 500-200 MG-UNIT tablet Take 1 tablet by mouth.     cetirizine (ZYRTEC) 10 MG tablet Take 10 mg by mouth daily.     diclofenac sodium (VOLTAREN) 1 % GEL daily as needed.     fluticasone (FLONASE) 50 MCG/ACT nasal spray USE 1 SPRAY IN EACH NOSTRIL TWICE DAILY 48 g 1   guaiFENesin (GNP MUCUS ER) 600 MG 12 hr tablet  TAKE 1 TABLET BY MOUTH 2 TIMES DAILY AS NEEDED 60 tablet 3   HYDROcodone-acetaminophen (NORCO/VICODIN) 5-325 MG tablet take 1 TABLET BY MOUTH EVERY 6 HOURS AS NEEDED for moderate pain 120 tablet 0   L-Methylfolate 15 MG TABS Take 1 tablet (15 mg total) by mouth daily. 90 tablet 1   lamoTRIgine (LAMICTAL) 200 MG tablet TAKE 1/2 TABLET EVERY MORNING  AND TAKE 2 TABLETS AT BEDTIME 225 tablet 1   Magnesium 500 MG TABS Take 250 mg by mouth daily.     meclizine (ANTIVERT) 25 MG tablet Take 1 tablet (25 mg total) by mouth as needed for dizziness. 30 tablet 1   meloxicam (MOBIC) 15 MG tablet TAKE 1 TABLET BY MOUTH ONCE A DAY AS NEEDED INFLAMMATION     methocarbamol (ROBAXIN) 500 MG tablet TAKE 1 TABLET BY MOUTH EVERY 6 TO 8 HOURS AS NEEDED FOR SPASMS/MUSCLE TENSION     pantoprazole (PROTONIX) 40 MG tablet Take 1 tablet (40 mg total) by mouth daily. 90 tablet 1   pramipexole (MIRAPEX) 0.125 MG tablet TAKE 1 TABLET 2 TO 3 HOURS BEFORE BEDTIME, THEN MAY INCREASE  TO 2 TABLETS AT BEDTIME AFTER 4 TO 7 DAYS AS TOLERATED 180 tablet 1   REPATHA SURECLICK 021 MG/ML SOAJ INJECT $RemoveBef'140MG'EvhaJPrCOP$  INTO THE SKIN EVERY 14 DAYS 2 mL 11   traZODone (DESYREL) 100 MG tablet Take 2 tablets (200 mg total) by mouth at bedtime. 180 tablet 1   triamcinolone cream (KENALOG) 0.1 % Apply 1 application topically 2 (two) times daily. 30 g 0   triamterene-hydrochlorothiazide (MAXZIDE-25) 37.5-25 MG tablet Take 0.5 tablets daily by mouth. 90 tablet 3   TRUE METRIX BLOOD GLUCOSE TEST test strip      TRUEPLUS LANCETS 33G MISC      venlafaxine XR (EFFEXOR-XR) 150 MG 24 hr capsule TAKE 2 CAPSULES EVERY DAY WITH BREAKFAST 180 capsule 1   Fluticasone-Salmeterol 113-14 MCG/ACT AEPB Inhale 1 PUFF into the lungs 2 TIMES DAILY 1 each 11   montelukast (SINGULAIR) 10 MG tablet Take 1 tablet (10 mg total) at bedtime by mouth. 90 tablet 3   No facility-administered medications prior to visit.   Review of Systems  Review of Systems  Constitutional:  Negative.   HENT:  Positive for congestion.   Respiratory:  Positive for cough. Negative for chest tightness, shortness of breath and wheezing.     Physical Exam  BP (!) 106/52 (BP Location: Left Arm, Patient Position: Sitting, Cuff Size: Normal)   Pulse 62   Temp 98.2 F (36.8 C) (Oral)   Ht $R'5\' 2"'uy$  (1.575 m)   Wt 159 lb (72.1 kg)   LMP 06/25/2011   SpO2 97%   BMI 29.08 kg/m  Physical Exam Constitutional:      General: She is not in acute distress.    Appearance: Normal appearance. She is not ill-appearing.  HENT:     Mouth/Throat:     Mouth: Mucous membranes are moist.     Pharynx: Oropharynx is clear.  Cardiovascular:     Rate and Rhythm: Normal rate and regular rhythm.  Pulmonary:     Effort: Pulmonary effort is normal.     Breath sounds: Normal breath sounds. No wheezing, rhonchi or rales.  Musculoskeletal:        General: Normal range of motion.  Skin:    General: Skin is warm and dry.  Neurological:     General: No focal deficit present.     Mental Status: She is alert and oriented to person, place, and time. Mental status is at baseline.  Psychiatric:        Mood and Affect: Mood normal.        Behavior: Behavior normal.        Thought Content: Thought content normal.        Judgment: Judgment normal.     Lab Results:  CBC    Component Value Date/Time   WBC 6.4 08/12/2016 1659   WBC 10.6 (H) 03/13/2016 1720   RBC 4.53 08/12/2016 1659   RBC 4.87 03/13/2016 1720   HGB 14.2 08/12/2016 1659   HGB 13.7 08/20/2014 1117   HCT 41.9 08/12/2016 1659   HCT 40.3 08/20/2014 1117   PLT 230 08/12/2016 1659   MCV 93 08/12/2016 1659   MCV 94.1 08/20/2014 1117   MCH 31.3 08/12/2016 1659   MCH 30.6 03/13/2016 1720   MCHC 33.9 08/12/2016 1659   MCHC 34.3 03/13/2016 1720   RDW 14.1 08/12/2016 1659   RDW 13.2 08/20/2014 1117   LYMPHSABS 2.4 03/13/2016 1720   LYMPHSABS 2.0 08/20/2014 1117   MONOABS 0.9 03/13/2016 1720   MONOABS 0.5  08/20/2014 1117   EOSABS 0.0  03/13/2016 1720   EOSABS 0.1 08/20/2014 1117   BASOSABS 0.0 03/13/2016 1720   BASOSABS 0.1 08/20/2014 1117    BMET    Component Value Date/Time   NA 140 11/25/2017 1155   NA 138 08/20/2014 1117   K 4.3 11/25/2017 1155   K 4.7 08/20/2014 1117   CL 98 11/25/2017 1155   CL 104 06/29/2012 1221   CO2 27 11/25/2017 1155   CO2 26 08/20/2014 1117   GLUCOSE 84 11/25/2017 1155   GLUCOSE 82 03/13/2016 1720   GLUCOSE 92 08/20/2014 1117   GLUCOSE 107 (H) 06/29/2012 1221   BUN 17 11/25/2017 1155   BUN 16.0 08/20/2014 1117   CREATININE 0.93 11/25/2017 1155   CREATININE 0.9 08/20/2014 1117   CALCIUM 9.8 11/25/2017 1155   CALCIUM 9.4 08/20/2014 1117   GFRNONAA 68 11/25/2017 1155   GFRAA 78 11/25/2017 1155    BNP No results found for: BNP  ProBNP No results found for: PROBNP  Imaging: No results found.   Assessment & Plan:   Chronic obstructive pulmonary disease ([place]) - Stable; Current smoker. No acute complaints. Dyspnea with inclines. Chronic cough improved with mucinex. Rare SABA use. CAT score 12. Following with lung cancer screening program.  - Continue Fluticasone-salmeterol inhaler one puff morning and evening. Use albuterol hfa 2 puffs every 4-6 hours only as needed for breakthrough shortness of breath - Continue Singulair 34m a bedtime  - Continue mucinex for cough/chest congestion - Strongly encourage smoking cessation  - FU in 1 year or sooner if needed    EMartyn Ehrich NP 06/15/2021

## 2021-06-03 ENCOUNTER — Ambulatory Visit: Payer: Medicare HMO | Admitting: Cardiovascular Disease

## 2021-06-03 ENCOUNTER — Encounter: Payer: Self-pay | Admitting: Cardiovascular Disease

## 2021-06-03 DIAGNOSIS — J449 Chronic obstructive pulmonary disease, unspecified: Secondary | ICD-10-CM | POA: Diagnosis not present

## 2021-06-03 DIAGNOSIS — Z789 Other specified health status: Secondary | ICD-10-CM

## 2021-06-03 DIAGNOSIS — I700 Atherosclerosis of aorta: Secondary | ICD-10-CM

## 2021-06-03 DIAGNOSIS — Z720 Tobacco use: Secondary | ICD-10-CM

## 2021-06-03 DIAGNOSIS — E785 Hyperlipidemia, unspecified: Secondary | ICD-10-CM | POA: Diagnosis not present

## 2021-06-03 DIAGNOSIS — R002 Palpitations: Secondary | ICD-10-CM

## 2021-06-03 DIAGNOSIS — I2510 Atherosclerotic heart disease of native coronary artery without angina pectoris: Secondary | ICD-10-CM | POA: Diagnosis not present

## 2021-06-03 DIAGNOSIS — F32A Depression, unspecified: Secondary | ICD-10-CM

## 2021-06-03 NOTE — Progress Notes (Signed)
Patient ID: Anita Arroyo, female   DOB: Dec 26, 1960, 61 y.o.   MRN: [id number]     HPI: Anita Arroyo is a 61 y.o. female who presents for a 68 month cardiology evaluation.  I initially saw Anita Arroyo in March 2013 for evaluation of episodes of chest tightness with walking. She has a long-standing history of ongoing tobacco use, and reportedly it had a history of possible mitral valve prolapse. An echo Doppler study in April 2013 showed normal systolic function with grade 1 diastolic dysfunction. She had trace mitral regurgitation and frank mitral valve prolapse was not demonstrated. She did have mild pulmonary hypertension with estimated systolic pressure 34 mm. A nuclear perfusion study revealed normal perfusion.  She has ahistory of hyperlipidemia which was treated with Zetia. She has undergone left shoulder surgery by Anita Arroyo and had a ruptured by sepsis tendon. 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  She lost approximately 21 pounds.  She has seen a nutritionist.  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  Unfortunately, she continues to smoke approximately 1-1/2ppd. She never had the prescription for Chantix filled.  She does have Mnire's disease for which she takes Dyazide.  She is on trazodone 200 mg at bedtime and also takes Lamictal 150 mg twice a day in addition to Effexor XR for her bipolar history.  She continues to take tamoxifen with her history of breast CA.  She has a history of palpitations and had been taking atenolol 50 mg in the morning and 25 mg at night. Due to increasing palpitations atenolol was increased to  50 mg twice a day with improvement.  He denies any  presyncope or syncope.    In 2016 a comprehensive metabolic panel, CBC, thyroid function studies were all entirely normal.  Lipid studies were excellent with a total cholesterol 144, triglycerides 80, HDL 49, LDL 79.  She will be establishing with a new primary care physician.  She was seen in August 2017 for preoperative clearance prior to undergoing shoulder surgery which was done by Anita Arroyo.  She tolerated her surgery well without cardiovascular compromise.  In February 2018, she was bitten by a cat in her leg and developed infection and cellulitis requiring antibiotic therapy.  In March 2018, she underwent a screening chest CT in light of her long-standing tobacco history.  This suggested atherosclerosis involving her left circumflex coronary artery and atherosclerosis in a nonaneurysmal thoracic aorta.  She continues to smoke cigarettes and has been smoking 1-1-1/2 packs per day, having started at age 37.    When I saw her in May 2018, she had stopped taking her Lipitor and previously also been on Zetia, which she stopped secondary to cost.  With her atherosclerosis noted on CT imaging.  I recommended aggressive lipid management with target LDL less than 70.  She had not had recent lab work and as result, this was scheduled.  Her subsequent lipid panel revealed significant increasing cholesterol at 250, triglycerides 134, HDL 61, LDL 162.  We called in a prescription for Crestor 20 mg daily.  Her blood sugar was also mildly increased.  She apparently only took the Crestor for short duration and  then stopped taking this since she had felt weak and was concerned about possible myalgias.  She underwent an echo Doppler study which showed normal systolic function and grade 1 diastolic dysfunction.  An exercise nuclear study was performed in July 2018 which showed normal perfusion and function.  She denies recent chest pain.  She admits to being tired.  Her mother recently had abdominal hernia surgery.  [name] denies any chest pain or palpitations.    She could not tolerate rosuvastatin and believes this caused severe depression along with fatigue and myalgias.  In addition she could not tolerate atorvastatin.  She believes she may have tried Zetia but is uncertain about this and cannot remember why she stopped taking it.  She was seen by Anita Arroyo for consideration of PCSK9 inhibition and potential future use of benpeodoic acid once approved in early 2020.  She has continued to see Anita Arroyo for pulmonary issues.  Although she was referred to him for possible COPD, this was not detected on spirometry and she is not felt to have this.  She continues to undergo annual low-dose CT of her chest for lung cancer screening.  Her most recent evaluation was November 07, 2017.  She was found to have normal heart size.  There is suggestion of left circumflex coronary atherosclerosis as well as atherosclerotic nonaneurysmal thoracic aorta.  There is a new tiny anterior apical right upper lobe pulmonary nodule measuring 2.7 mm in volume which is felt to be benign in appearance.  When I saw her in January 2020 she denied any chest pain or palpitations.   Her last laboratory in November 2019 showed a BUN of 17 creatinine 0.93.  Lipid studies revealed elevated cholesterol 235, HDL 70, LDL 142, triglycerides 113.  Hemoglobin A1c was 5.6.  She was evaluated in the emergency room in mid December several days after falling when she tripped over her cat.  She is felt to have possible plateau fracture of her right knee.  She has seen Anita Arroyo.  She was walking with crutches.  She was still smoking  cigarettes 1-1/2 and had a prescription for Chantix/  I last saw her in December 2020.  She had not been successful with smoking cessation.  She denies any recent palpitations on her current atenolol 50 mg in the morning and 25 mg in the evening.  It has been a difficult year with the COVID-19 pandemic and as result she has not  been able to do much work in caring for family animals since people are not traveling.  On December 29, 2018 she underwent a follow-up chest CT screen which again showed emphysema and aortic atherosclerosis.  There was left circumflex coronary artery calcification.  She had previously noted benign-appearing lung nodules.  She had undergone recent laboratory by her primary physician in November 2020.  Lipid studies were further elevated with total cholesterol 262, LDL cholesterol now 176, HDL 60, and triglycerides 144.  She would like to [place] if she is able to qualify for assistance since she cannot afford this.  She continues to be on Symbicort 80/4.5 but this will be increased to 160/4.5 by her pulmonologist.  She is also on Singulair.  She stopped taking Zetia since she felt this exacerbated her depression.  She has a history of bipolar disorder and is on Lamictal, Effexor SR and trazodone.  During that evaluation, I had [employer], our Pharm.D. see her to set her up for hopeful assistance with Repatha  through the community.  Since I last saw her, she has been followed by Daphane Shepherd, PA at [facility].  And had undergone a CBC in February 2023 which showed a hemoglobin of 12 and hematocrit 34.3 with white count 4.9 and platelets 2 14,000.  She has seen Geraldo Pitter, NP at [facility] pulmonary and was on fluticasone/salmeterol inhaler twice a day.  She had started using weight watchers and had lost over 20 pounds with improved energy.  She was on Singulair 10 mg at bedtime  Presently, she denies any chest pain.  At times she admits to some right arm sensation.  She is now drinking protein drinks and eating heart healthy.  Unfortunately she continues to smoke cigarettes.  She continues to be on atenolol 50 mg in the morning and 25 mg in the evening and triamterene/HCTZ 0.5 tablet daily.  She is on Repatha injections 140 mg every 2 weeks for hyperlipidemia.  She continues to take Singulair at  bedtime and fluticasone/salmeterol inhalation twice a day.  She is on pantoprazole for GERD and Mirapex for restless legs.  She is on Abilify, Lamictal and Effexor.  She presents for evaluation.  Past Medical History:  Diagnosis Date   Allergy    pollen and environmental   Anxiety    Arthritis    Asthma    Bipolar 1 disorder (HCC)    Bipolar 1 disorder (HCC)    Breast cancer ([place])    Cataract    COPD (chronic obstructive pulmonary disease) (HCC)    Depression    Dysrhythmia    palpitations   Dysrhythmia    atrial fibrillation   Fibromyalgia    Finger fracture 2011   GERD (gastroesophageal reflux disease)    Heart murmur    History of stress test 04/28/2011   showed normal perfusion without scar or ischemia   Hx of echocardiogram 04/28/2011   showed normal systolic function with mild diastolic dysfunction,she had trace MR but did not have frank mitral valve prolapse demonstrated. She had mild pulmonary hypertension with an estimated RV systolic pressure at 34 mm.   Hyperlipemia    Meniere disease    MVP (mitral valve prolapse)    Neuromuscular disorder [facility])    fibromyalgia    Past Surgical History:  Procedure Laterality Date   ANKLE SURGERY     left x4   BREAST LUMPECTOMY Right 2011   BREAST SURGERY  2011   lumpectomy right breast    COLONOSCOPY     COLONOSCOPY WITH PROPOFOL N/A 02/25/2014   Procedure: COLONOSCOPY WITH PROPOFOL;  Surgeon: Garlan Fair, MD;  Location: [facility];  Service: Endoscopy;  Laterality: N/A;   deviated septum     ECTOPIC PREGNANCY SURGERY     ELBOW SURGERY     right elbow   EYE SURGERY Bilateral    cataract surgery   fibroid     2-3 fibroid adenomas removed   HAND SURGERY Left 2020   fall from Dog, pinkie finger, ring finger, middle finger   JOINT REPLACEMENT  2011   right knee    KNEE SURGERY     left knee   POLYPECTOMY     SHOULDER ARTHROSCOPY WITH BICEPSTENOTOMY Right 09/25/2015   Procedure: SHOULDER ARTHROSCOPY WITH  BICEPSTENOTOMY;  Surgeon: Ninetta Lights, MD;  Location: [place];  Service: Orthopedics;  Laterality: Right;   SHOULDER ARTHROSCOPY WITH DISTAL CLAVICLE RESECTION Right 09/25/2015   Procedure: SHOULDER ARTHROSCOPY WITH DISTAL CLAVICLE RESECTION;  Surgeon: Ninetta Lights,  MD;  Location: [place];  Service: Orthopedics;  Laterality: Right;   SHOULDER ARTHROSCOPY WITH SUBACROMIAL DECOMPRESSION Right 09/25/2015   Procedure: RIGHT SHOULDER ARTHROSCOPY DEBRIDEMENT,ACROMIOPLASTY,DISTAL CLAVICAL EXCISION, RELEASE OF BICEPS TENDON;  Surgeon: Ninetta Lights, MD;  Location: [place];  Service: Orthopedics;  Laterality: Right;   TMJ ARTHROPLASTY     TONSILLECTOMY     TOTAL SHOULDER ARTHROPLASTY Left 01/05/2016   UTERINE FIBROID SURGERY     polups and fibroids removed    WRIST SURGERY     left    Allergies  Allergen Reactions   Contrast Media [Iodinated Contrast Media] Anaphylaxis    Anaphylactic shock.   Iodine Shortness Of Breath   Ezetimibe Other (See Comments)    "I can't take any cholesterol medicines." "I can't take any cholesterol medicines."    Statins Other (See Comments)    Body aches, constipation AND DEPRESSION Body aches, constipation Body aches, constipation AND DEPRESSION   Sulfa Antibiotics Nausea And Vomiting   Adhesive [Tape] Other (See Comments)    Reaction:  Blisters    Celecoxib     "I climb the walls"   Augmentin [Amoxicillin-Pot Clavulanate] Rash and Other (See Comments)    Has patient had a PCN reaction causing immediate rash, facial/tongue/throat swelling, SOB or lightheadedness with hypotension: No Has patient had a PCN reaction causing severe rash involving mucus membranes or skin necrosis: No Has patient had a PCN reaction that required hospitalization No Has patient had a PCN reaction occurring within the last 10 years: No If all of the above answers are "NO", then may proceed with Cephalosporin use.   Ceclor  [Cefaclor] Rash   Moxifloxacin Rash   Sulfonamide Derivatives Nausea And Vomiting    Current Outpatient Medications  Medication Sig Dispense Refill   albuterol (VENTOLIN HFA) 108 (90 Base) MCG/ACT inhaler Inhale 2 puffs into the lungs every 4 (four) hours as needed for wheezing or shortness of breath (cough, shortness of breath or wheezing.). 8 g 11   Alcohol Swabs (B-D SINGLE USE SWABS REGULAR) PADS      ARIPiprazole (ABILIFY) 5 MG tablet Take 1 tablet (5 mg total) by mouth daily. 90 tablet 1   atenolol (TENORMIN) 50 MG tablet Take 50 mg in the morning and 25 mg in the evening 135 tablet 1   Biotin 5 MG CAPS Take 5 mg by mouth daily.     blood glucose meter kit and supplies KIT Dispense based on patient and insurance preference. Use daily as directed. (FOR ICD-9 250.00, 250.01). 1 each 0   calcium-vitamin D (OSCAL WITH D) 500-200 MG-UNIT tablet Take 1 tablet by mouth.     cetirizine (ZYRTEC) 10 MG tablet Take 10 mg by mouth daily.     diclofenac sodium (VOLTAREN) 1 % GEL daily as needed.     fluticasone (FLONASE) 50 MCG/ACT nasal spray USE 1 SPRAY IN EACH NOSTRIL TWICE DAILY 48 g 1   Fluticasone-Salmeterol 113-14 MCG/ACT AEPB Inhale 1 PUFF into the lungs 2 TIMES DAILY 1 each 11   guaiFENesin (GNP MUCUS ER) 600 MG 12 hr tablet TAKE 1 TABLET BY MOUTH 2 TIMES DAILY AS NEEDED 60 tablet 3   HYDROcodone-acetaminophen (NORCO/VICODIN) 5-325 MG tablet take 1 TABLET BY MOUTH EVERY 6 HOURS AS NEEDED for moderate pain 120 tablet 0   L-Methylfolate 15 MG TABS Take 1 tablet (15 mg total) by mouth daily. 90 tablet 1   lamoTRIgine (LAMICTAL) 200 MG tablet TAKE 1/2 TABLET EVERY MORNING  AND  TAKE 2 TABLETS AT BEDTIME 225 tablet 1   LUTEIN PO Take by mouth daily.     Magnesium 500 MG TABS Take 250 mg by mouth daily.     meclizine (ANTIVERT) 25 MG tablet Take 1 tablet (25 mg total) by mouth as needed for dizziness. 30 tablet 1   meloxicam (MOBIC) 15 MG tablet TAKE 1 TABLET BY MOUTH ONCE A DAY AS NEEDED  INFLAMMATION     methocarbamol (ROBAXIN) 500 MG tablet TAKE 1 TABLET BY MOUTH EVERY 6 TO 8 HOURS AS NEEDED FOR SPASMS/MUSCLE TENSION     montelukast (SINGULAIR) 10 MG tablet Take 1 tablet (10 mg total) by mouth at bedtime. 90 tablet 3   pantoprazole (PROTONIX) 40 MG tablet Take 1 tablet (40 mg total) by mouth daily. 90 tablet 1   pramipexole (MIRAPEX) 0.125 MG tablet TAKE 1 TABLET 2 TO 3 HOURS BEFORE BEDTIME, THEN MAY INCREASE TO 2 TABLETS AT BEDTIME AFTER 4 TO 7 DAYS AS TOLERATED 180 tablet 1   REPATHA SURECLICK 024 MG/ML SOAJ INJECT 140MG INTO THE SKIN EVERY 14 DAYS 2 mL 11   traZODone (DESYREL) 100 MG tablet Take 2 tablets (200 mg total) by mouth at bedtime. 180 tablet 1   triamcinolone cream (KENALOG) 0.1 % Apply 1 application topically 2 (two) times daily. 30 g 0   triamterene-hydrochlorothiazide (MAXZIDE-25) 37.5-25 MG tablet Take 0.5 tablets daily by mouth. 90 tablet 3   TRUE METRIX BLOOD GLUCOSE TEST test strip      TRUEPLUS LANCETS 33G MISC      venlafaxine XR (EFFEXOR-XR) 150 MG 24 hr capsule TAKE 2 CAPSULES EVERY DAY WITH BREAKFAST 180 capsule 1   No current facility-administered medications for this visit.    Social History   Socioeconomic History   Marital status: Single    Spouse name: Not on file   Number of children: Not on file   Years of education: Not on file   Highest education level: Not on file  Occupational History   Not on file  Tobacco Use   Smoking status: Every Day    Packs/day: 1.50    Years: 40.00    Pack years: 60.00    Types: Cigarettes   Smokeless tobacco: Never   Tobacco comments:    Cannot afford chantix. Working on getting discount from drug compny  Vaping Use   Vaping Use: Some days   Start date: 06/23/2020   Substances: Nicotine  Substance and Sexual Activity   Alcohol use: Not Currently    Comment: less than monthly   Drug use: No   Sexual activity: Not on file  Other Topics Concern   Not on file  Social History Narrative   Not on  file   Social Determinants of Health   Financial Resource Strain: Not on file  Food Insecurity: Not on file  Transportation Needs: Not on file  Physical Activity: Not on file  Stress: Not on file  Social Connections: Not on file  Intimate Partner Violence: Not on file    Family History  Problem Relation Age of Onset   Breast cancer Mother        biological mother   Mitral valve prolapse Mother    Heart Problems Mother    Cancer Mother    Colon cancer Maternal Grandfather    Esophageal cancer Neg Hx    Rectal cancer Neg Hx    Stomach cancer Neg Hx    Socially she is single. She has no children. She has  been smoking for approximately 40 years and still smokes 1-11/2 pack per day per. 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   ROS General: Negative; No fevers, chills, or night sweats; positive for fatigue HEENT: Negative; No changes in vision or hearing, sinus congestion, difficulty swallowing Pulmonary: Positive for mild shortness of breath with activity Cardiovascular: Negative; No chest pain, presyncope, syncope, palpitations GI: Negative; No nausea, vomiting, diarrhea, or abdominal pain GU: Negative; No dysuria, hematuria, or difficulty voiding Musculoskeletal: Negative; no myalgias, joint pain, or weakness Hematologic/Oncology: Negative; no easy bruising, bleeding Endocrine: Negative; no heat/cold intolerance; no diabetes Neuro: Negative; no changes in balance, headaches Skin: Negative; No rashes or skin lesions Psychiatric: depression Sleep: Negative; No snoring, daytime sleepiness, hypersomnolence, bruxism, restless legs, hypnogognic hallucinations, no cataplexy Other comprehensive 14 point system review is negative.   PE BP (!) 106/56   Pulse 65   Ht $R'5\' 2"'mk$  (1.575 m)   Wt 154 lb 3.2 oz (69.9 kg)   LMP 06/25/2011   SpO2 95%   BMI 28.20 kg/m    Repeat blood pressure by me was 122/68  Wt Readings from Last 3 Encounters:  06/03/21 154 lb 3.2  oz (69.9 kg)  05/13/21 159 lb (72.1 kg)  10/28/20 172 lb (78 kg)    General: Alert, oriented, no distress.  Skin: normal turgor, no rashes, warm and dry HEENT: Normocephalic, atraumatic. Pupils equal round and reactive to light; sclera anicteric; extraocular muscles intact;  Nose without nasal septal hypertrophy Mouth/Parynx benign; Mallinpatti scale 3 Neck: No JVD, no carotid bruits; normal carotid upstroke Lungs: Slightly decreased breath sounds, no wheezing or rales Chest wall: without tenderness to palpitation Heart: PMI not displaced, RRR, s1 s2 normal, 1/6 systolic murmur, no diastolic murmur, no rubs, gallops, thrills, or heaves Abdomen: soft, nontender; no hepatosplenomehaly, BS+; abdominal aorta nontender and not dilated by palpation. Back: no CVA tenderness Pulses 2+ Musculoskeletal: full range of motion, normal strength, no joint deformities Extremities: no clubbing cyanosis or edema, Homan's sign negative  Neurologic: grossly nonfocal; Cranial nerves grossly wnl Psychologic: Normal mood and affect    Jun 03, 2021 ECG (independently read by me): NSR at 65, No STT changes  December 2020 ECG (independently read by me): Normal sinus rhythm at 70 bpm.  Nonspecific T wave abnormality.  Normal intervals.  January 2020 ECG (independently read by me): Normal sinus rhythm at 69 bpm.  Incomplete right bundle branch block.  LVH.  No ectopy.  Normal intervals.  October 2018 ECG (independently read by me): Normal sinus rhythm at 67 bpm.  LVH by voltage.  Nonspecific ST changes.  QTc interval 445 ms.  May 2018 ECG (independently read by me): Normal sinus rhythm at 70 bpm.  Borderline voltage criteria for LVH.  QTc interval 451 ms.  PR interval normal.  Nose ST-T change.  August 2016 ECG (independently read by me): Normal sinus rhythm at 65 bpm.  Mild RV conduction delay.  No securing ST-T changes.  May 2015 ECG (independently read by me): Normal sinus rhythm at 67 bpm.  QTc interval  443 ms.  Nonspecific T-wave changes.  October 2014 ECG: Sinus rhythm at 69 beats per minute. Mild RV conduction delay. Normal intervals.  LABS:    Latest Ref Rng & Units 11/25/2017   11:55 AM 01/28/2017    2:14 PM 08/12/2016    4:59 PM  BMP  Glucose 65 - 99 mg/dL 84   90   126    BUN 6 -  24 mg/dL 17   16   17     Creatinine 0.57 - 1.00 mg/dL 0.93   0.92   1.02    BUN/Creat Ratio 9 - 23 18   17   17     Sodium 134 - 144 mmol/L 140   141   141    Potassium 3.5 - 5.2 mmol/L 4.3   4.3   4.9    Chloride 96 - 106 mmol/L 98   100   101    CO2 20 - 29 mmol/L 27   26   26     Calcium 8.7 - 10.2 mg/dL 9.8   9.9   9.6        Latest Ref Rng & Units 11/25/2017   11:55 AM 01/28/2017    2:14 PM 08/12/2016    4:59 PM  Hepatic Function  Total Protein 6.0 - 8.5 g/dL 6.7   6.7   6.8    Albumin 3.5 - 5.5 g/dL 4.7   4.6   4.6    AST 0 - 40 IU/L 21   22   26     ALT 0 - 32 IU/L 21   21   23     Alk Phosphatase 39 - 117 IU/L 109   83   90    Total Bilirubin 0.0 - 1.2 mg/dL <0.2   0.3   0.3    Bilirubin, Direct 0.00 - 0.40 mg/dL 0.08          Latest Ref Rng & Units 08/12/2016    4:59 PM 03/13/2016    5:20 PM 08/20/2014   11:17 AM  CBC  WBC 3.4 - 10.8 x10E3/uL 6.4   10.6   5.6    Hemoglobin 11.1 - 15.9 g/dL 14.2   14.9   13.7    Hematocrit 34.0 - 46.6 % 41.9   43.4   40.3    Platelets 150 - 379 x10E3/uL 230   186   179     Lab Results  Component Value Date   MCV 93 08/12/2016   MCV 89.1 03/13/2016   MCV 94.1 08/20/2014   Lab Results  Component Value Date   TSH 0.804 08/12/2016   Lab Results  Component Value Date   HGBA1C 5.6 01/28/2017    Lipid Panel     Component Value Date/Time   CHOL 235 (H) 11/25/2017 1155   TRIG 113 11/25/2017 1155   HDL 70 11/25/2017 1155   CHOLHDL 3.4 11/25/2017 1155   CHOLHDL 2.9 11/22/2013 0904   VLDL 16 11/22/2013 0904   LDLCALC 142 (H) 11/25/2017 1155     RADIOLOGY: Dg Chest 2 View  11/10/2012   CLINICAL DATA:  Cough and chest pain  EXAM: CHEST  2 VIEW   COMPARISON:  July 30, 2012  FINDINGS: There is a calcified granuloma in the left lower lobe. Lungs are otherwise clear. Heart size and pulmonary vascularity are normal. No adenopathy. There are no appreciable bone lesions.  IMPRESSION: Granuloma left base. No edema or consolidation   Electronically Signed   By: Lowella Grip M.D.   On: 11/10/2012 15:22   Mr Shoulder Left Wo Contrast  11/12/2012   CLINICAL DATA:  Extreme shoulder pain for months. Prior shoulder surgery approximately 1 year ago.  EXAM: MRI OF THE LEFT SHOULDER WITHOUT CONTRAST  TECHNIQUE: Multiplanar, multisequence MR imaging of the shoulder was performed. No intravenous contrast was administered.  COMPARISON:  Left shoulder MRI 06/04/2011.  FINDINGS: Rotator cuff: Interval development of infraspinous  tendinosis with hyperintensity extending to the musculotendinous junction. No focal tear is demonstrated. There is stable mild supraspinatus and subscapularis tendinosis.  Muscles:  No focal muscular atrophy or edema.  Biceps long head: The intra-articular portion of the biceps tendon is now diminutive with irregular signal consistent with tendinosis and partial tearing. No full-thickness tendon tear or tendon dislocation is identified.  Acromioclavicular Joint: The acromion is type 1. Patient has undergone interval distal clavicle resection and probable acromioplasty. There is no osseous encroachment on the rotator cuff passageway. There is no significant fluid in the subacromial -subdeltoid space.  Glenohumeral Joint: No significant shoulder joint effusion. The previously demonstrated edema and cystic changes superiorly in the glenoid have improved. However, there is new edema and subchondral cyst formation in the anterior inferior glenoid.  Labrum: There is labral degeneration without evidence of discrete tear.  Bones: No significant extra-articular osseous findings. The cystic changes previously noted medial to the lesser tuberosity have  improved.  IMPRESSION: 1. Interval distal clavicle resection and acromioplasty. No rotator cuff impingement identified. 2. Infraspinous tendinosis without evidence of rotator cuff tear. 3. New diminution of the long head of the biceps tendon consistent with partial tearing. 4. Progressive glenohumeral degenerative changes with new subchondral cyst formation and edema in the anterior inferior glenoid. No discrete labral tear identified.   Electronically Signed   By: Camie Patience M.D.   On: 11/12/2012 12:14    ECHO: 07/08/2016 Study Conclusions  - Left ventricle: The cavity size was normal. Wall thickness was    normal. Systolic function was normal. The estimated ejection    fraction was in the range of 60% to 65%. Doppler parameters are    consistent with abnormal left ventricular relaxation (grade 1    diastolic dysfunction).    LEXISCAN MYOVIEW  08/12/2016 The left ventricular ejection fraction is normal (55-65%). Nuclear stress EF: 58%. There was no ST segment deviation noted during stress. No T wave inversion was noted during stress. The study is normal. This is a low risk study.  IMPRESSION:  1. Atherosclerosis of native coronary artery of native heart without angina pectoris   2. Chronic obstructive pulmonary disease, unspecified COPD type ([place])   3. Hyperlipidemia with target LDL less than 70   4. Atherosclerosis of aorta (HCC)   5. Palpitations   6. Depression, unspecified depression type   7. Statin intolerance   8. Tobacco use     ASSESSMENT AND PLAN: Ms Molchan is a 61 year old female who has a 60+ pack year history of tobacco use and started smoking at age 53, currently for 5 years.  She has a history of palpitations and has been on atenolol 50 mg in the morning and 25 mg in the evening.   Surveillance low-dose CT imaging has been relatively stable.  However she has been have thoracic aortic atherosclerosis as well as aortic sclerosis involving her left circumflex coronary  artery.  This had not changed on her most recent study of December 29, 2018.  Prior echo Doppler study has shown normal systolic function with grade 1 diastolic dysfunction.  A nuclear perfusion study from 2018 was low risk with post-rest EF at 58%.  She has had issues with statins, and is on PCSK9 inhibition with Repatha 140 mg every 2 weeks.  She is followed by pulmonary and is on Singulair in addition to fluticasone-salmeterol inhalation.  She is now followed at [facility] for primary care.  Laboratory in October 2022 showed total cholesterol 239 triglycerides 158 LDL  148.  Repeat lab work on March 09, 2021 showed total cholesterol 199 triglycerides 169 HDL 56 and LDL 114.  Follow-up laboratory will be necessary in the near future for efficacy of Repatha.  She does not have any chest pain.  She has a history of depression/bipolar and continues to be on Abilify in addition to Effexor and Lamictal.  She is unaware of any palpitations.  She has been successful with recent weight loss and her weight has reduced from 180 pounds down to 154 presently.  We discussed the importance of smoking cessation if at all possible.  There is no chest pain.  She has noticed some right arm sensation which is not exertional in etiology.  Troy Sine, MD, [facility]  06/13/2021 11:57 AM

## 2021-06-03 NOTE — Patient Instructions (Signed)
Medication Instructions:  ?Continue same medications ?*If you need a refill on your cardiac medications before your next appointment, please call your pharmacy* ? ? ?Lab Work: ?Have lab done with Primary Care ? ? ?Testing/Procedures: ?None ordered ? ? ?Follow-Up: ?At [facility], you and your health needs are our priority.  As part of our continuing mission to provide you with exceptional heart care, we have created designated Provider Care Teams.  These Care Teams include your primary Cardiologist (physician) and Advanced Practice Providers (APPs -  Physician Assistants and Nurse Practitioners) who all work together to provide you with the care you need, when you need it. ? ?We recommend signing up for the patient portal called "MyChart".  Sign up information is provided on this After Visit Summary.  MyChart is used to connect with patients for Virtual Visits (Telemedicine).  Patients are able to view lab/test results, encounter notes, upcoming appointments, etc.  Non-urgent messages can be sent to your provider as well.   ?To learn more about what you can do with MyChart, go to [email or web address].   ? ?Your next appointment:  1 year    Call in Feb to schedule May appointment  ?  ? ?The format for your next appointment: Office ? ? ?Provider:  Dr.Kelly ? ? ?Important Information About Sugar ? ? ? ? ? ? ?

## 2021-06-13 ENCOUNTER — Encounter: Payer: Self-pay | Admitting: Cardiovascular Disease

## 2021-06-15 NOTE — Assessment & Plan Note (Addendum)
-   Stable; Current smoker. No acute complaints. Dyspnea with inclines. Chronic cough improved with mucinex. Rare SABA use. CAT score 12. Following with lung cancer screening program.  - Continue Fluticasone-salmeterol inhaler one puff morning and evening. Use albuterol hfa 2 puffs every 4-6 hours only as needed for breakthrough shortness of breath - Continue Singulair '10mg'$  a bedtime  - Continue mucinex for cough/chest congestion - Strongly encourage smoking cessation  - FU in 1 year or sooner if needed

## 2021-06-25 ENCOUNTER — Other Ambulatory Visit: Payer: Self-pay | Admitting: Psychiatry

## 2021-06-25 DIAGNOSIS — F3162 Bipolar disorder, current episode mixed, moderate: Secondary | ICD-10-CM

## 2021-06-25 DIAGNOSIS — F5101 Primary insomnia: Secondary | ICD-10-CM

## 2021-06-25 DIAGNOSIS — G2581 Restless legs syndrome: Secondary | ICD-10-CM

## 2021-07-31 ENCOUNTER — Telehealth: Payer: Medicare HMO | Admitting: Psychiatry

## 2021-08-06 ENCOUNTER — Encounter: Payer: Self-pay | Admitting: Psychiatry

## 2021-08-06 ENCOUNTER — Ambulatory Visit (INDEPENDENT_AMBULATORY_CARE_PROVIDER_SITE_OTHER): Payer: Medicare HMO | Admitting: Psychiatry

## 2021-08-06 DIAGNOSIS — F5101 Primary insomnia: Secondary | ICD-10-CM

## 2021-08-06 DIAGNOSIS — F3162 Bipolar disorder, current episode mixed, moderate: Secondary | ICD-10-CM | POA: Diagnosis not present

## 2021-08-06 MED ORDER — ARIPIPRAZOLE 5 MG PO TABS: 5.0000 mg | ORAL_TABLET | Freq: Every day | ORAL | 1 refills | Status: DC

## 2021-08-06 MED ORDER — LAMOTRIGINE 200 MG PO TABS: ORAL_TABLET | ORAL | 1 refills | Status: DC

## 2021-08-06 MED ORDER — VENLAFAXINE HCL ER 150 MG PO CP24: ORAL_CAPSULE | ORAL | 1 refills | Status: DC

## 2021-08-06 MED ORDER — TRAZODONE HCL 100 MG PO TABS: 200.0000 mg | ORAL_TABLET | Freq: Every day | ORAL | 0 refills | Status: DC

## 2021-08-06 MED ORDER — L-METHYLFOLATE 15 MG PO TABS: 1.0000 | ORAL_TABLET | Freq: Every day | ORAL | 1 refills | Status: DC

## 2021-08-06 NOTE — Progress Notes (Signed)
Anita Arroyo [id number] 1960-11-11 61 y.o.  Virtual Visit via Telephone Note  I connected with pt on 08/06/21 at  1:00 PM EDT by telephone and verified that I am speaking with the correct person using two identifiers.   I discussed the limitations, risks, security and privacy concerns of performing an evaluation and management service by telephone and the availability of in person appointments. I also discussed with the patient that there may be a patient responsible charge related to this service. The patient expressed understanding and agreed to proceed.   I discussed the assessment and treatment plan with the patient. The patient was provided an opportunity to ask questions and all were answered. The patient agreed with the plan and demonstrated an understanding of the instructions.   The patient was advised to call back or seek an in-person evaluation if the symptoms worsen or if the condition fails to improve as anticipated.  I provided 25 minutes of non-face-to-face time during this encounter.  The patient was located at home.  The provider was located at home.   Thayer Headings, PMHNP   Subjective:   Patient ID:  Anita Arroyo is a 61 y.o. (DOB January 31, 1960) female.  Chief Complaint:  Chief Complaint  Patient presents with   Follow-up    Depression and anxiety    HPI CHIANTE PEDEN presents for follow-up of depression and anxiety. She is recovering from [place] "and I was doing good before that... other than that, I am doing great." She has had symptoms for about a week.   She has intentionally lost 38 lbs with [employer]. She reports that she has had more energy since she lost weight and now energy is low with COVID.  Motivation is good except for taking care of her house, she reports that "it's always been that way." Denies hoarding. Denies any recent depression. Denies anxiety. Concentration has been good. She reports poor sleep last night due to COVID symptoms and coughing. She  reports that she had been sleeping well until COVID. Denies SI.   Denies any acute stressors.   Work has been going well.   Review of Systems:  Review of Systems  HENT:  Positive for congestion and voice change.   Respiratory:  Positive for cough. Negative for shortness of breath.   Musculoskeletal:  Negative for gait problem.  Neurological:  Negative for tremors.  Psychiatric/Behavioral:         Please refer to HPI  She reports that RLS has been well controlled with one 0.125 mg tabs. Occ will need to take an additional tablet as needed.   Medications: I have reviewed the patient's current medications.  Current Outpatient Medications  Medication Sig Dispense Refill   albuterol (VENTOLIN HFA) 108 (90 Base) MCG/ACT inhaler Inhale 2 puffs into the lungs every 4 (four) hours as needed for wheezing or shortness of breath (cough, shortness of breath or wheezing.). 8 g 11   atenolol (TENORMIN) 50 MG tablet Take 50 mg in the morning and 25 mg in the evening 135 tablet 1   Biotin 5 MG CAPS Take 5 mg by mouth daily.     calcium-vitamin D (OSCAL WITH D) 500-200 MG-UNIT tablet Take 1 tablet by mouth.     cetirizine (ZYRTEC) 10 MG tablet Take 10 mg by mouth daily.     diclofenac sodium (VOLTAREN) 1 % GEL daily as needed.     fluticasone (FLONASE) 50 MCG/ACT nasal spray USE 1 SPRAY IN EACH NOSTRIL TWICE DAILY  48 g 1   Fluticasone-Salmeterol 113-14 MCG/ACT AEPB Inhale 1 PUFF into the lungs 2 TIMES DAILY 1 each 11   guaiFENesin (GNP MUCUS ER) 600 MG 12 hr tablet TAKE 1 TABLET BY MOUTH 2 TIMES DAILY AS NEEDED 60 tablet 3   HYDROcodone-acetaminophen (NORCO/VICODIN) 5-325 MG tablet take 1 TABLET BY MOUTH EVERY 6 HOURS AS NEEDED for moderate pain 120 tablet 0   LUTEIN PO Take by mouth daily.     Magnesium 500 MG TABS Take 250 mg by mouth daily.     meclizine (ANTIVERT) 25 MG tablet Take 1 tablet (25 mg total) by mouth as needed for dizziness. 30 tablet 1   meloxicam (MOBIC) 15 MG tablet TAKE 1 TABLET  BY MOUTH ONCE A DAY AS NEEDED INFLAMMATION     methocarbamol (ROBAXIN) 500 MG tablet TAKE 1 TABLET BY MOUTH EVERY 6 TO 8 HOURS AS NEEDED FOR SPASMS/MUSCLE TENSION     montelukast (SINGULAIR) 10 MG tablet Take 1 tablet (10 mg total) by mouth at bedtime. 90 tablet 3   pantoprazole (PROTONIX) 40 MG tablet Take 1 tablet (40 mg total) by mouth daily. 90 tablet 1   pramipexole (MIRAPEX) 0.125 MG tablet TAKE 1 TABLET 2 TO 3 HOURS BEFORE BEDTIME, THEN MAY INCREASE TO 2 TABLETS AT BEDTIME  AS TOLERATED 180 tablet 1   REPATHA SURECLICK 443 MG/ML SOAJ INJECT 140MG INTO THE SKIN EVERY 14 DAYS 2 mL 11   triamcinolone cream (KENALOG) 0.1 % Apply 1 application topically 2 (two) times daily. 30 g 0   triamterene-hydrochlorothiazide (MAXZIDE-25) 37.5-25 MG tablet Take 0.5 tablets daily by mouth. 90 tablet 3   Alcohol Swabs (B-D SINGLE USE SWABS REGULAR) PADS      ARIPiprazole (ABILIFY) 5 MG tablet Take 1 tablet (5 mg total) by mouth daily. 90 tablet 1   blood glucose meter kit and supplies KIT Dispense based on patient and insurance preference. Use daily as directed. (FOR ICD-9 250.00, 250.01). 1 each 0   L-Methylfolate 15 MG TABS Take 1 tablet (15 mg total) by mouth daily. 90 tablet 1   lamoTRIgine (LAMICTAL) 200 MG tablet TAKE 1/2 TABLET EVERY MORNING  AND TAKE 2 TABLETS AT BEDTIME 225 tablet 1   traZODone (DESYREL) 100 MG tablet Take 2 tablets (200 mg total) by mouth at bedtime. 180 tablet 0   TRUE METRIX BLOOD GLUCOSE TEST test strip      TRUEPLUS LANCETS 33G MISC      venlafaxine XR (EFFEXOR-XR) 150 MG 24 hr capsule TAKE 2 CAPSULES EVERY DAY WITH BREAKFAST 180 capsule 1   No current facility-administered medications for this visit.    Medication Side Effects: None  Allergies:  Allergies  Allergen Reactions   Contrast Media [Iodinated Contrast Media] Anaphylaxis    Anaphylactic shock.   Iodine Shortness Of Breath   Ezetimibe Other (See Comments)    "I can't take any cholesterol medicines." "I can't  take any cholesterol medicines."    Statins Other (See Comments)    Body aches, constipation AND DEPRESSION Body aches, constipation Body aches, constipation AND DEPRESSION   Sulfa Antibiotics Nausea And Vomiting   Adhesive [Tape] Other (See Comments)    Reaction:  Blisters    Celecoxib     "I climb the walls"   Augmentin [Amoxicillin-Pot Clavulanate] Rash and Other (See Comments)    Has patient had a PCN reaction causing immediate rash, facial/tongue/throat swelling, SOB or lightheadedness with hypotension: No Has patient had a PCN reaction causing severe rash involving mucus  membranes or skin necrosis: No Has patient had a PCN reaction that required hospitalization No Has patient had a PCN reaction occurring within the last 10 years: No If all of the above answers are "NO", then may proceed with Cephalosporin use.   Ceclor [Cefaclor] Rash   Moxifloxacin Rash   Sulfonamide Derivatives Nausea And Vomiting    Past Medical History:  Diagnosis Date   Allergy    pollen and environmental   Anxiety    Arthritis    Asthma    Bipolar 1 disorder (HCC)    Bipolar 1 disorder (HCC)    Breast cancer ([place])    Cataract    COPD (chronic obstructive pulmonary disease) (HCC)    Depression    Dysrhythmia    palpitations   Dysrhythmia    atrial fibrillation   Fibromyalgia    Finger fracture 2011   GERD (gastroesophageal reflux disease)    Heart murmur    History of stress test 04/28/2011   showed normal perfusion without scar or ischemia   Hx of echocardiogram 04/28/2011   showed normal systolic function with mild diastolic dysfunction,she had trace MR but did not have frank mitral valve prolapse demonstrated. She had mild pulmonary hypertension with an estimated RV systolic pressure at 34 mm.   Hyperlipemia    Meniere disease    MVP (mitral valve prolapse)    Neuromuscular disorder (HCC)    fibromyalgia    Family History  Problem Relation Age of Onset   Breast cancer Mother         biological mother   Mitral valve prolapse Mother    Heart Problems Mother    Cancer Mother    Colon cancer Maternal Grandfather    Esophageal cancer Neg Hx    Rectal cancer Neg Hx    Stomach cancer Neg Hx     Social History   Socioeconomic History   Marital status: Single    Spouse name: Not on file   Number of children: Not on file   Years of education: Not on file   Highest education level: Not on file  Occupational History   Not on file  Tobacco Use   Smoking status: Every Day    Packs/day: 1.50    Years: 40.00    Total pack years: 60.00    Types: Cigarettes   Smokeless tobacco: Never   Tobacco comments:    Cannot afford chantix. Working on getting discount from drug compny  Vaping Use   Vaping Use: Some days   Start date: 06/23/2020   Substances: Nicotine  Substance and Sexual Activity   Alcohol use: Not Currently    Comment: less than monthly   Drug use: No   Sexual activity: Not on file  Other Topics Concern   Not on file  Social History Narrative   Not on file   Social Determinants of Health   Financial Resource Strain: Not on file  Food Insecurity: Not on file  Transportation Needs: Not on file  Physical Activity: Not on file  Stress: Not on file  Social Connections: Not on file  Intimate Partner Violence: Not on file    Past Medical History, Surgical history, Social history, and Family history were reviewed and updated as appropriate.   Please see review of systems for further details on the patient's review from today.   Objective:   Physical Exam:  Wt 140 lb (63.5 kg)   LMP 05/30/2011   BMI 25.61 kg/m   Physical Exam  Neurological:     Mental Status: She is alert and oriented to person, place, and time.     Cranial Nerves: No dysarthria.  Psychiatric:        Attention and Perception: Attention and perception normal.        Mood and Affect: Mood normal.        Speech: Speech normal.        Behavior: Behavior is cooperative.         Thought Content: Thought content normal. Thought content is not paranoid or delusional. Thought content does not include homicidal or suicidal ideation. Thought content does not include homicidal or suicidal plan.        Cognition and Memory: Cognition and memory normal.        Judgment: Judgment normal.     Comments: Insight intact     Lab Review:     Component Value Date/Time   NA 140 11/25/2017 1155   NA 138 08/20/2014 1117   K 4.3 11/25/2017 1155   K 4.7 08/20/2014 1117   CL 98 11/25/2017 1155   CL 104 06/29/2012 1221   CO2 27 11/25/2017 1155   CO2 26 08/20/2014 1117   GLUCOSE 84 11/25/2017 1155   GLUCOSE 82 03/13/2016 1720   GLUCOSE 92 08/20/2014 1117   GLUCOSE 107 (H) 06/29/2012 1221   BUN 17 11/25/2017 1155   BUN 16.0 08/20/2014 1117   CREATININE 0.93 11/25/2017 1155   CREATININE 0.9 08/20/2014 1117   CALCIUM 9.8 11/25/2017 1155   CALCIUM 9.4 08/20/2014 1117   PROT 6.7 11/25/2017 1155   PROT 6.6 08/20/2014 1117   ALBUMIN 4.7 11/25/2017 1155   ALBUMIN 3.9 08/20/2014 1117   AST 21 11/25/2017 1155   AST 22 08/20/2014 1117   ALT 21 11/25/2017 1155   ALT 18 08/20/2014 1117   ALKPHOS 109 11/25/2017 1155   ALKPHOS 54 08/20/2014 1117   BILITOT <0.2 11/25/2017 1155   BILITOT 0.47 08/20/2014 1117   GFRNONAA 68 11/25/2017 1155   GFRAA 78 11/25/2017 1155       Component Value Date/Time   WBC 6.4 08/12/2016 1659   WBC 10.6 (H) 03/13/2016 1720   RBC 4.53 08/12/2016 1659   RBC 4.87 03/13/2016 1720   HGB 14.2 08/12/2016 1659   HGB 13.7 08/20/2014 1117   HCT 41.9 08/12/2016 1659   HCT 40.3 08/20/2014 1117   PLT 230 08/12/2016 1659   MCV 93 08/12/2016 1659   MCV 94.1 08/20/2014 1117   MCH 31.3 08/12/2016 1659   MCH 30.6 03/13/2016 1720   MCHC 33.9 08/12/2016 1659   MCHC 34.3 03/13/2016 1720   RDW 14.1 08/12/2016 1659   RDW 13.2 08/20/2014 1117   LYMPHSABS 2.4 03/13/2016 1720   LYMPHSABS 2.0 08/20/2014 1117   MONOABS 0.9 03/13/2016 1720   MONOABS 0.5 08/20/2014  1117   EOSABS 0.0 03/13/2016 1720   EOSABS 0.1 08/20/2014 1117   BASOSABS 0.0 03/13/2016 1720   BASOSABS 0.1 08/20/2014 1117    No results found for: "POCLITH", "LITHIUM"   No results found for: "PHENYTOIN", "PHENOBARB", "VALPROATE", "CBMZ"   .res Assessment: Plan:    Patient seen for 25 minutes and time spent discussing recent mood and anxiety symptoms.  She reports that overall she has been doing very well until recent COVID infection.  She reports that she would like to continue current medications without changes since mood and anxiety symptoms are well controlled.  Continue Abilify 5 mg po qd for mood.  Continue Lamictal 100  mg and 400 mg at bedtime for mood symptoms.  Continue L-Methylfolate 15 mg po qd for mood.  Continue Pramipexole 0.125 mg at bedtime for RLS. Continue Trazodone 200 mg po QHS for insomnia.  Continue Effexor XR 300 mg po qd for depression and anxiety.  Pt to follow-up in 6 months or sooner if clinically indicated.  Patient advised to contact office with any questions, adverse effects, or acute worsening in signs and symptoms.   Janal was seen today for follow-up.  Diagnoses and all orders for this visit:  Bipolar 1 disorder, mixed, moderate (HCC) -     ARIPiprazole (ABILIFY) 5 MG tablet; Take 1 tablet (5 mg total) by mouth daily. -     lamoTRIgine (LAMICTAL) 200 MG tablet; TAKE 1/2 TABLET EVERY MORNING  AND TAKE 2 TABLETS AT BEDTIME -     L-Methylfolate 15 MG TABS; Take 1 tablet (15 mg total) by mouth daily. -     venlafaxine XR (EFFEXOR-XR) 150 MG 24 hr capsule; TAKE 2 CAPSULES EVERY DAY WITH BREAKFAST  Primary insomnia -     traZODone (DESYREL) 100 MG tablet; Take 2 tablets (200 mg total) by mouth at bedtime.    Please see After Visit Summary for patient specific instructions.  Future Appointments  Date Time Provider [place]  05/14/2022  2:00 PM Martyn Ehrich, NP [facility] None    No orders of the defined types were placed in  this encounter.     -------------------------------

## 2021-10-14 ENCOUNTER — Other Ambulatory Visit: Payer: Self-pay | Admitting: Primary Care

## 2021-10-14 ENCOUNTER — Telehealth: Payer: Self-pay | Admitting: Primary Care

## 2021-10-14 DIAGNOSIS — J449 Chronic obstructive pulmonary disease, unspecified: Secondary | ICD-10-CM

## 2021-10-14 DIAGNOSIS — Z79899 Other long term (current) drug therapy: Secondary | ICD-10-CM

## 2021-10-14 MED ORDER — FLUTICASONE-SALMETEROL 113-14 MCG/ACT IN AEPB: INHALATION_SPRAY | RESPIRATORY_TRACT | 11 refills | Status: DC

## 2021-10-14 NOTE — Telephone Encounter (Signed)
Pharmacy called to request a refill for the patient's Fluticasone Salmeterol.  Patient is completely out of this medication.  Please advise and call pharmacy to get filled at [phone]

## 2021-10-14 NOTE — Telephone Encounter (Signed)
Rx for pt's fluticasone salmeterol has been sent to pharmacy for pt. Nothing further needed.

## 2021-10-21 ENCOUNTER — Other Ambulatory Visit: Payer: Self-pay | Admitting: Physician Assistant

## 2021-10-21 DIAGNOSIS — Z1231 Encounter for screening mammogram for malignant neoplasm of breast: Secondary | ICD-10-CM

## 2021-11-03 ENCOUNTER — Ambulatory Visit: Payer: Medicare HMO

## 2021-11-11 ENCOUNTER — Other Ambulatory Visit: Payer: Self-pay | Admitting: Cardiovascular Disease

## 2021-11-11 DIAGNOSIS — I700 Atherosclerosis of aorta: Secondary | ICD-10-CM

## 2021-11-11 DIAGNOSIS — E785 Hyperlipidemia, unspecified: Secondary | ICD-10-CM

## 2021-11-11 DIAGNOSIS — I2510 Atherosclerotic heart disease of native coronary artery without angina pectoris: Secondary | ICD-10-CM

## 2021-11-20 ENCOUNTER — Other Ambulatory Visit: Payer: Self-pay

## 2021-11-20 DIAGNOSIS — F17210 Nicotine dependence, cigarettes, uncomplicated: Secondary | ICD-10-CM

## 2021-11-20 DIAGNOSIS — Z122 Encounter for screening for malignant neoplasm of respiratory organs: Secondary | ICD-10-CM

## 2021-11-20 DIAGNOSIS — Z87891 Personal history of nicotine dependence: Secondary | ICD-10-CM

## 2021-12-08 ENCOUNTER — Ambulatory Visit: Payer: Medicare HMO

## 2022-01-13 ENCOUNTER — Telehealth: Payer: Self-pay | Admitting: Psychiatry

## 2022-01-13 DIAGNOSIS — F3162 Bipolar disorder, current episode mixed, moderate: Secondary | ICD-10-CM

## 2022-01-14 NOTE — Telephone Encounter (Signed)
Please schedule appt

## 2022-01-14 NOTE — Telephone Encounter (Signed)
Has apt 1/2. Please send Rx

## 2022-01-26 ENCOUNTER — Ambulatory Visit (INDEPENDENT_AMBULATORY_CARE_PROVIDER_SITE_OTHER): Payer: Medicare HMO | Admitting: Psychiatry

## 2022-01-26 ENCOUNTER — Encounter: Payer: Self-pay | Admitting: Psychiatry

## 2022-01-26 VITALS — Wt 121.0 lb

## 2022-01-26 DIAGNOSIS — G2581 Restless legs syndrome: Secondary | ICD-10-CM

## 2022-01-26 DIAGNOSIS — F5101 Primary insomnia: Secondary | ICD-10-CM | POA: Diagnosis not present

## 2022-01-26 DIAGNOSIS — F3162 Bipolar disorder, current episode mixed, moderate: Secondary | ICD-10-CM | POA: Diagnosis not present

## 2022-01-26 MED ORDER — VENLAFAXINE HCL ER 150 MG PO CP24: ORAL_CAPSULE | ORAL | 1 refills | Status: DC

## 2022-01-26 MED ORDER — LAMOTRIGINE 200 MG PO TABS: ORAL_TABLET | ORAL | 1 refills | Status: DC

## 2022-01-26 MED ORDER — TRAZODONE HCL 100 MG PO TABS: 200.0000 mg | ORAL_TABLET | Freq: Every day | ORAL | 0 refills | Status: DC

## 2022-01-26 MED ORDER — ARIPIPRAZOLE 5 MG PO TABS: 5.0000 mg | ORAL_TABLET | Freq: Every day | ORAL | 1 refills | Status: DC

## 2022-01-26 MED ORDER — L-METHYLFOLATE 15 MG PO TABS: 1.0000 | ORAL_TABLET | Freq: Every day | ORAL | 1 refills | Status: DC

## 2022-01-26 NOTE — Progress Notes (Signed)
Anita Arroyo [id number] 1961-01-15 62 y.o.  Virtual Visit via Telephone Note  I connected with pt on 01/26/22 at 12:45 PM EST by telephone and verified that I am speaking with the correct person using two identifiers.   I discussed the limitations, risks, security and privacy concerns of performing an evaluation and management service by telephone and the availability of in person appointments. I also discussed with the patient that there may be a patient responsible charge related to this service. The patient expressed understanding and agreed to proceed.   I discussed the assessment and treatment plan with the patient. The patient was provided an opportunity to ask questions and all were answered. The patient agreed with the plan and demonstrated an understanding of the instructions.   The patient was advised to call back or seek an in-person evaluation if the symptoms worsen or if the condition fails to improve as anticipated.  I provided 26 minutes of non-face-to-face time during this encounter.  The patient was located at home.  The provider was located at home.   Anita Arroyo, PMHNP   Subjective:   Patient ID:  Anita Arroyo is a 62 y.o. (DOB 1960-11-15) female.  Chief Complaint:  Chief Complaint  Patient presents with   Follow-up    Depression and anxiety    HPI Anita Arroyo presents for follow-up of anxiety and depression. She reports, "everything is wonderful." She has intentionally lost 57 lbs with diet and exercise through [employer]. Denies restricting food intake. She reports that her energy and motivation have been good. She reports that she has been more active. She reports that her sleep has been adequate overall. She denies depressed mood. She reports that she only has anxiety when it is time to pay bills. She reports that her work has been slower than she would like, but reports that this is typical for this time of year. Denies irritability other than 2 days after  not sleeping. She reports impulsivity has improved. She buys groceries online and picks them up in person and this is helpful since she only buys items she needs. She had to buy new clothes with weight change. She reports improved concentration and focus. She reports that she is periodically getting together with friends. She has started knitting again and knits while she is watching TV. Denies SI.   Review of Systems:  Review of Systems  Musculoskeletal:  Negative for gait problem.       PT has helped neck pain and shoulder pain.  Allergic/Immunologic: Positive for environmental allergies.  Neurological:  Positive for tremors.  Psychiatric/Behavioral:         Please refer to HPI    Medications: I have reviewed the patient's current medications.  Current Outpatient Medications  Medication Sig Dispense Refill   albuterol (VENTOLIN HFA) 108 (90 Base) MCG/ACT inhaler Inhale 2 puffs into the lungs every 4 (four) hours as needed for wheezing or shortness of breath (cough, shortness of breath or wheezing.). 8 g 11   Alcohol Swabs (B-D SINGLE USE SWABS REGULAR) PADS      ARIPiprazole (ABILIFY) 5 MG tablet Take 1 tablet (5 mg total) by mouth daily. 90 tablet 1   atenolol (TENORMIN) 50 MG tablet Take 50 mg in the morning and 25 mg in the evening 135 tablet 1   Biotin 5 MG CAPS Take 5 mg by mouth daily.     blood glucose meter kit and supplies KIT Dispense based on patient and insurance preference.  Use daily as directed. (FOR ICD-9 250.00, 250.01). 1 each 0   calcium-vitamin D (OSCAL WITH D) 500-200 MG-UNIT tablet Take 1 tablet by mouth.     cetirizine (ZYRTEC) 10 MG tablet Take 10 mg by mouth daily.     diclofenac sodium (VOLTAREN) 1 % GEL daily as needed.     Evolocumab (REPATHA SURECLICK) 354 MG/ML SOAJ INJECT 140 MG into THE SKIN EVERY 14 DAYS 6 mL 1   fluticasone (FLONASE) 50 MCG/ACT nasal spray USE 1 SPRAY IN EACH NOSTRIL TWICE DAILY 48 g 1   Fluticasone-Salmeterol 113-14 MCG/ACT AEPB Inhale 1  PUFF into the lungs 2 TIMES DAILY 1 each 11   guaiFENesin (GNP MUCUS ER) 600 MG 12 hr tablet TAKE 1 TABLET BY MOUTH 2 TIMES DAILY AS NEEDED 60 tablet 3   HYDROcodone-acetaminophen (NORCO/VICODIN) 5-325 MG tablet take 1 TABLET BY MOUTH EVERY 6 HOURS AS NEEDED for moderate pain 120 tablet 0   L-Methylfolate 15 MG TABS Take 1 tablet (15 mg total) by mouth daily. 90 tablet 1   lamoTRIgine (LAMICTAL) 200 MG tablet TAKE 1/2 TABLET EVERY MORNING  AND TAKE 2 TABLETS AT BEDTIME 225 tablet 1   LUTEIN PO Take by mouth daily.     Magnesium 500 MG TABS Take 250 mg by mouth daily.     meclizine (ANTIVERT) 25 MG tablet Take 1 tablet (25 mg total) by mouth as needed for dizziness. 30 tablet 1   meloxicam (MOBIC) 15 MG tablet TAKE 1 TABLET BY MOUTH ONCE A DAY AS NEEDED INFLAMMATION     methocarbamol (ROBAXIN) 500 MG tablet TAKE 1 TABLET BY MOUTH EVERY 6 TO 8 HOURS AS NEEDED FOR SPASMS/MUSCLE TENSION     montelukast (SINGULAIR) 10 MG tablet Take 1 tablet (10 mg total) by mouth at bedtime. 90 tablet 3   pantoprazole (PROTONIX) 40 MG tablet Take 1 tablet (40 mg total) by mouth daily. 90 tablet 1   pramipexole (MIRAPEX) 0.125 MG tablet TAKE 1 TABLET 2 TO 3 HOURS BEFORE BEDTIME, THEN MAY INCREASE TO 2 TABLETS AT BEDTIME  AS TOLERATED 180 tablet 1   traZODone (DESYREL) 100 MG tablet Take 2 tablets (200 mg total) by mouth at bedtime. 180 tablet 0   triamcinolone cream (KENALOG) 0.1 % Apply 1 application topically 2 (two) times daily. 30 g 0   triamterene-hydrochlorothiazide (MAXZIDE-25) 37.5-25 MG tablet Take 0.5 tablets daily by mouth. 90 tablet 3   TRUE METRIX BLOOD GLUCOSE TEST test strip      TRUEPLUS LANCETS 33G MISC      [START ON 04/06/2022] venlafaxine XR (EFFEXOR-XR) 150 MG 24 hr capsule TAKE 2 CAPSULES EVERY DAY WITH BREAKFAST 180 capsule 1   No current facility-administered medications for this visit.    Medication Side Effects: Other: tremor   Occ jerks (denies worsening)  Allergies:  Allergies   Allergen Reactions   Contrast Media [Iodinated Contrast Media] Anaphylaxis    Anaphylactic shock.   Iodine Shortness Of Breath   Ezetimibe Other (See Comments)    "I can't take any cholesterol medicines." "I can't take any cholesterol medicines."    Statins Other (See Comments)    Body aches, constipation AND DEPRESSION Body aches, constipation Body aches, constipation AND DEPRESSION   Sulfa Antibiotics Nausea And Vomiting   Adhesive [Tape] Other (See Comments)    Reaction:  Blisters    Celecoxib     "I climb the walls"   Augmentin [Amoxicillin-Pot Clavulanate] Rash and Other (See Comments)    Has patient had  a PCN reaction causing immediate rash, facial/tongue/throat swelling, SOB or lightheadedness with hypotension: No Has patient had a PCN reaction causing severe rash involving mucus membranes or skin necrosis: No Has patient had a PCN reaction that required hospitalization No Has patient had a PCN reaction occurring within the last 10 years: No If all of the above answers are "NO", then may proceed with Cephalosporin use.   Ceclor [Cefaclor] Rash   Moxifloxacin Rash   Sulfonamide Derivatives Nausea And Vomiting    Past Medical History:  Diagnosis Date   Allergy    pollen and environmental   Anxiety    Arthritis    Asthma    Bipolar 1 disorder (HCC)    Bipolar 1 disorder (HCC)    Breast cancer ([place])    Cataract    COPD (chronic obstructive pulmonary disease) (HCC)    Depression    Dysrhythmia    palpitations   Dysrhythmia    atrial fibrillation   Fibromyalgia    Finger fracture 2011   GERD (gastroesophageal reflux disease)    Heart murmur    History of stress test 04/28/2011   showed normal perfusion without scar or ischemia   Hx of echocardiogram 04/28/2011   showed normal systolic function with mild diastolic dysfunction,she had trace MR but did not have frank mitral valve prolapse demonstrated. She had mild pulmonary hypertension with an estimated RV  systolic pressure at 34 mm.   Hyperlipemia    Meniere disease    MVP (mitral valve prolapse)    Neuromuscular disorder (HCC)    fibromyalgia    Family History  Problem Relation Age of Onset   Breast cancer Mother        biological mother   Mitral valve prolapse Mother    Heart Problems Mother    Cancer Mother    Colon cancer Maternal Grandfather    Esophageal cancer Neg Hx    Rectal cancer Neg Hx    Stomach cancer Neg Hx     Social History   Socioeconomic History   Marital status: Single    Spouse name: Not on file   Number of children: Not on file   Years of education: Not on file   Highest education level: Not on file  Occupational History   Not on file  Tobacco Use   Smoking status: Every Day    Packs/day: 1.50    Years: 40.00    Total pack years: 60.00    Types: Cigarettes   Smokeless tobacco: Never   Tobacco comments:    Cannot afford chantix. Working on getting discount from drug compny  Vaping Use   Vaping Use: Some days   Start date: 06/23/2020   Substances: Nicotine  Substance and Sexual Activity   Alcohol use: Not Currently    Comment: less than monthly   Drug use: No   Sexual activity: Not on file  Other Topics Concern   Not on file  Social History Narrative   Not on file   Social Determinants of Health   Financial Resource Strain: Not on file  Food Insecurity: Not on file  Transportation Needs: Not on file  Physical Activity: Not on file  Stress: Not on file  Social Connections: Not on file  Intimate Partner Violence: Not on file    Past Medical History, Surgical history, Social history, and Family history were reviewed and updated as appropriate.   Please see review of systems for further details on the patient's review from today.  Objective:   Physical Exam:  Wt 121 lb (54.9 kg)   LMP 05/30/2011   BMI 22.13 kg/m   Physical Exam Neurological:     Mental Status: She is alert and oriented to person, place, and time.      Cranial Nerves: No dysarthria.  Psychiatric:        Attention and Perception: Attention and perception normal.        Mood and Affect: Mood normal.        Speech: Speech normal.        Behavior: Behavior is cooperative.        Thought Content: Thought content normal. Thought content is not paranoid or delusional. Thought content does not include homicidal or suicidal ideation. Thought content does not include homicidal or suicidal plan.        Cognition and Memory: Cognition and memory normal.        Judgment: Judgment normal.     Comments: Insight intact     Lab Review:     Component Value Date/Time   NA 140 11/25/2017 1155   NA 138 08/20/2014 1117   K 4.3 11/25/2017 1155   K 4.7 08/20/2014 1117   CL 98 11/25/2017 1155   CL 104 06/29/2012 1221   CO2 27 11/25/2017 1155   CO2 26 08/20/2014 1117   GLUCOSE 84 11/25/2017 1155   GLUCOSE 82 03/13/2016 1720   GLUCOSE 92 08/20/2014 1117   GLUCOSE 107 (H) 06/29/2012 1221   BUN 17 11/25/2017 1155   BUN 16.0 08/20/2014 1117   CREATININE 0.93 11/25/2017 1155   CREATININE 0.9 08/20/2014 1117   CALCIUM 9.8 11/25/2017 1155   CALCIUM 9.4 08/20/2014 1117   PROT 6.7 11/25/2017 1155   PROT 6.6 08/20/2014 1117   ALBUMIN 4.7 11/25/2017 1155   ALBUMIN 3.9 08/20/2014 1117   AST 21 11/25/2017 1155   AST 22 08/20/2014 1117   ALT 21 11/25/2017 1155   ALT 18 08/20/2014 1117   ALKPHOS 109 11/25/2017 1155   ALKPHOS 54 08/20/2014 1117   BILITOT <0.2 11/25/2017 1155   BILITOT 0.47 08/20/2014 1117   GFRNONAA 68 11/25/2017 1155   GFRAA 78 11/25/2017 1155       Component Value Date/Time   WBC 6.4 08/12/2016 1659   WBC 10.6 (H) 03/13/2016 1720   RBC 4.53 08/12/2016 1659   RBC 4.87 03/13/2016 1720   HGB 14.2 08/12/2016 1659   HGB 13.7 08/20/2014 1117   HCT 41.9 08/12/2016 1659   HCT 40.3 08/20/2014 1117   PLT 230 08/12/2016 1659   MCV 93 08/12/2016 1659   MCV 94.1 08/20/2014 1117   MCH 31.3 08/12/2016 1659   MCH 30.6 03/13/2016 1720    MCHC 33.9 08/12/2016 1659   MCHC 34.3 03/13/2016 1720   RDW 14.1 08/12/2016 1659   RDW 13.2 08/20/2014 1117   LYMPHSABS 2.4 03/13/2016 1720   LYMPHSABS 2.0 08/20/2014 1117   MONOABS 0.9 03/13/2016 1720   MONOABS 0.5 08/20/2014 1117   EOSABS 0.0 03/13/2016 1720   EOSABS 0.1 08/20/2014 1117   BASOSABS 0.0 03/13/2016 1720   BASOSABS 0.1 08/20/2014 1117    No results found for: "POCLITH", "LITHIUM"   No results found for: "PHENYTOIN", "PHENOBARB", "VALPROATE", "CBMZ"   .res Assessment: Plan:    Pt reports that she has rare "jerks" and a brief, mild UE tremor some mornings. Discussed that these involuntary movements may be side effects with Abilify. She reports that movements are mild and infrequent and that benefits outweigh these  side effects.  Will continue Abilify 5 mg po qd for mood symptoms.  Continue Effexor XR 300 mg daily for mood and anxiety.  Continue L-methylfolate 15 mg daily for depression.  Continue Trazodone 200 mg at bedtime for insomnia.  Continue Lamictal 100 mg in the morning and 400 mg at bedtime for mood symptoms.  Continue Pramipexole 0.125 mg before bedtime and an additional tablet as needed. She reports that she typically takes only one tablet at night and has a significant supply on hand as a result. She requests that an additional script not be sent at this time and she will contact office when an additional script is needed.  Pt to follow-up in 6 months or sooner if clinically indicated.  Patient advised to contact office with any questions, adverse effects, or acute worsening in signs and symptoms.   Tremaine was seen today for follow-up.  Diagnoses and all orders for this visit:  Bipolar 1 disorder, mixed, moderate (HCC) -     ARIPiprazole (ABILIFY) 5 MG tablet; Take 1 tablet (5 mg total) by mouth daily. -     lamoTRIgine (LAMICTAL) 200 MG tablet; TAKE 1/2 TABLET EVERY MORNING  AND TAKE 2 TABLETS AT BEDTIME -     venlafaxine XR (EFFEXOR-XR) 150 MG 24 hr  capsule; TAKE 2 CAPSULES EVERY DAY WITH BREAKFAST -     L-Methylfolate 15 MG TABS; Take 1 tablet (15 mg total) by mouth daily.  Primary insomnia -     traZODone (DESYREL) 100 MG tablet; Take 2 tablets (200 mg total) by mouth at bedtime.  RLS (restless legs syndrome)    Please see After Visit Summary for patient specific instructions.  Future Appointments  Date Time Provider [place]  02/03/2022 12:30 PM [facility]  05/14/2022  2:00 PM Martyn Ehrich, NP [facility] None    No orders of the defined types were placed in this encounter.     -------------------------------

## 2022-01-27 ENCOUNTER — Other Ambulatory Visit: Payer: Self-pay | Admitting: Psychiatry

## 2022-01-27 DIAGNOSIS — G2581 Restless legs syndrome: Secondary | ICD-10-CM

## 2022-01-27 DIAGNOSIS — F5101 Primary insomnia: Secondary | ICD-10-CM

## 2022-02-01 ENCOUNTER — Other Ambulatory Visit: Payer: Self-pay | Admitting: Cardiovascular Disease

## 2022-02-01 DIAGNOSIS — I2510 Atherosclerotic heart disease of native coronary artery without angina pectoris: Secondary | ICD-10-CM

## 2022-02-01 DIAGNOSIS — E785 Hyperlipidemia, unspecified: Secondary | ICD-10-CM

## 2022-02-01 DIAGNOSIS — I700 Atherosclerosis of aorta: Secondary | ICD-10-CM

## 2022-02-03 ENCOUNTER — Ambulatory Visit
Admission: RE | Admit: 2022-02-03 | Discharge: 2022-02-03 | Disposition: A | Discharge status: Home / Self Care | Payer: Medicare HMO | Source: Ambulatory Visit | Attending: Physician Assistant | Admitting: Physician Assistant

## 2022-02-03 DIAGNOSIS — Z1231 Encounter for screening mammogram for malignant neoplasm of breast: Secondary | ICD-10-CM

## 2022-02-12 ENCOUNTER — Other Ambulatory Visit (HOSPITAL_COMMUNITY): Payer: Self-pay

## 2022-04-06 ENCOUNTER — Ambulatory Visit: Payer: Medicare HMO | Admitting: Neurology

## 2022-04-06 ENCOUNTER — Encounter: Payer: Self-pay | Admitting: Neurology

## 2022-04-06 VITALS — BP 106/61 | HR 59 | Ht 62.0 in | Wt 121.0 lb

## 2022-04-06 DIAGNOSIS — M542 Cervicalgia: Secondary | ICD-10-CM | POA: Insufficient documentation

## 2022-04-06 DIAGNOSIS — R269 Unspecified abnormalities of gait and mobility: Secondary | ICD-10-CM | POA: Diagnosis not present

## 2022-04-06 NOTE — Progress Notes (Signed)
Chief Complaint  Patient presents with   New Patient (Initial Visit)    Rm 15, alone  NP Paper referral for shoulder height discrepancy      ASSESSMENT AND PLAN  Anita Arroyo is a 62 y.o. female  Worsening neck pain,  Complains of balance issues, brisk reflex on examination, mild neck tilt to right side  MRI of cervical spine to rule out cervical spondylitic myelopathy  If she continue complains of abnormal neck posturing, may consider low-dose EMG guided botulism toxin injection for her abnormal neck posturing   DIAGNOSTIC DATA (LABS, IMAGING, TESTING) - I reviewed patient records, labs, notes, testing and imaging myself where available.   MEDICAL HISTORY:  Anita Arroyo, is a 62 year old female seen in request by primary care PA Harrison Mons, for evaluation of tendency leaning towards the right side, initial evaluation was on April 06, 2022  I reviewed and summarized the referring note. PMHx Bipolar SVT COPD, smoke 2ppd GERD Left ankle fracture in 2001,  Left shoulder replacement.  She works as a Building control surveyor, used to be physically active, around September 2023, she noticed mild balance issue, tends to lean her body towards the right side, also complains of worsening neck pain, radiating pain to bilateral shoulder, received some physical therapy with some improvement,  She denies bilateral upper extremity sensorimotor deficit PHYSICAL EXAM:   Vitals:   04/06/22 0929  BP: 106/61  Pulse: (!) 59  Weight: 121 lb (54.9 kg)  Height: '5\' 2"'$  (1.575 m)   Not recorded     Body mass index is 22.13 kg/m.  PHYSICAL EXAMNIATION:  Gen: NAD, conversant, well nourised, well groomed                     Cardiovascular: Regular rate rhythm, no peripheral edema, warm, nontender. Eyes: Conjunctivae clear without exudates or hemorrhage Neck: Supple, no carotid bruits.  Mild right tilt Pulmonary: Clear to auscultation bilaterally   NEUROLOGICAL EXAM:  MENTAL  STATUS: Speech/cognition: Awake, alert, oriented to history taking and casual conversation,  CRANIAL NERVES: CN II: Visual fields are full to confrontation. Pupils are round equal and briskly reactive to light. CN III, IV, VI: extraocular movement are normal. No ptosis. CN V: Facial sensation is intact to light touch CN VII: Face is symmetric with normal eye closure  CN VIII: Hearing is normal to causal conversation. CN IX, X: Phonation is normal. CN XI: Head turning and shoulder shrug are intact  MOTOR: There is no pronator drift of out-stretched arms. Muscle bulk and tone are normal. Muscle strength is normal.  REFLEXES: Reflexes are 2+ and symmetric at the biceps, triceps, 3/3 knees, and ankles. Plantar responses are flexor.  SENSORY: Intact to light touch, pinprick and vibratory sensation are intact in fingers and toes.  COORDINATION: There is no trunk or limb dysmetria noted.  GAIT/STANCE: Can get up from seated position arm crossed, cautious,  REVIEW OF SYSTEMS:  Full 14 system review of systems performed and notable only for as above All other review of systems were negative.   ALLERGIES: Allergies  Allergen Reactions   Contrast Media [Iodinated Contrast Media] Anaphylaxis    Anaphylactic shock.   Iodine Shortness Of Breath   Ezetimibe Other (See Comments)    "I can't take any cholesterol medicines." "I can't take any cholesterol medicines."    Statins Other (See Comments)    Body aches, constipation AND DEPRESSION Body aches, constipation Body aches, constipation AND DEPRESSION   Sulfa Antibiotics  Nausea And Vomiting   Adhesive [Tape] Other (See Comments)    Reaction:  Blisters    Celecoxib     "I climb the walls"   Augmentin [Amoxicillin-Pot Clavulanate] Rash and Other (See Comments)    Has patient had a PCN reaction causing immediate rash, facial/tongue/throat swelling, SOB or lightheadedness with hypotension: No Has patient had a PCN reaction causing  severe rash involving mucus membranes or skin necrosis: No Has patient had a PCN reaction that required hospitalization No Has patient had a PCN reaction occurring within the last 10 years: No If all of the above answers are "NO", then may proceed with Cephalosporin use.   Ceclor [Cefaclor] Rash   Moxifloxacin Rash   Sulfonamide Derivatives Nausea And Vomiting    HOME MEDICATIONS: Current Outpatient Medications  Medication Sig Dispense Refill   albuterol (VENTOLIN HFA) 108 (90 Base) MCG/ACT inhaler Inhale 2 puffs into the lungs every 4 (four) hours as needed for wheezing or shortness of breath (cough, shortness of breath or wheezing.). 8 g 11   Alcohol Swabs (B-D SINGLE USE SWABS REGULAR) PADS      ARIPiprazole (ABILIFY) 5 MG tablet Take 1 tablet (5 mg total) by mouth daily. 90 tablet 1   atenolol (TENORMIN) 50 MG tablet Take 50 mg in the morning and 25 mg in the evening 135 tablet 1   Biotin 5 MG CAPS Take 5 mg by mouth daily.     calcium-vitamin D (OSCAL WITH D) 500-200 MG-UNIT tablet Take 1 tablet by mouth.     cetirizine (ZYRTEC) 10 MG tablet Take 10 mg by mouth daily.     diclofenac sodium (VOLTAREN) 1 % GEL daily as needed.     Evolocumab (REPATHA SURECLICK) [id number] MG/ML SOAJ INJECT 140 MG into THE SKIN EVERY 14 DAYS 6 mL 3   fluticasone (FLONASE) 50 MCG/ACT nasal spray USE 1 SPRAY IN EACH NOSTRIL TWICE DAILY 48 g 1   Fluticasone-Salmeterol 113-14 MCG/ACT AEPB Inhale 1 PUFF into the lungs 2 TIMES DAILY 1 each 11   guaiFENesin (GNP MUCUS ER) 600 MG 12 hr tablet TAKE 1 TABLET BY MOUTH 2 TIMES DAILY AS NEEDED 60 tablet 3   HYDROcodone-acetaminophen (NORCO/VICODIN) 5-325 MG tablet take 1 TABLET BY MOUTH EVERY 6 HOURS AS NEEDED for moderate pain 120 tablet 0   L-Methylfolate 15 MG TABS Take 1 tablet (15 mg total) by mouth daily. 90 tablet 1   lamoTRIgine (LAMICTAL) 200 MG tablet TAKE 1/2 TABLET EVERY MORNING  AND TAKE 2 TABLETS AT BEDTIME 225 tablet 1   LUTEIN PO Take by mouth daily.      Magnesium 500 MG TABS Take 250 mg by mouth daily.     meclizine (ANTIVERT) 25 MG tablet Take 1 tablet (25 mg total) by mouth as needed for dizziness. 30 tablet 1   meloxicam (MOBIC) 15 MG tablet TAKE 1 TABLET BY MOUTH ONCE A DAY AS NEEDED INFLAMMATION     methocarbamol (ROBAXIN) 500 MG tablet TAKE 1 TABLET BY MOUTH EVERY 6 TO 8 HOURS AS NEEDED FOR SPASMS/MUSCLE TENSION     montelukast (SINGULAIR) 10 MG tablet Take 1 tablet (10 mg total) by mouth at bedtime. 90 tablet 3   pantoprazole (PROTONIX) 40 MG tablet Take 1 tablet (40 mg total) by mouth daily. 90 tablet 1   pramipexole (MIRAPEX) 0.125 MG tablet TAKE 1 TABLET 2 TO 3 HOURS BEFORE BEDTIME, THEN MAY INCREASE AS DIRECTED TO 2 TABLETS AT BEDTIME AS TOLERATED 180 tablet 3   traZODone (DESYREL)  100 MG tablet Take 2 tablets (200 mg total) by mouth at bedtime. 180 tablet 0   triamcinolone cream (KENALOG) 0.1 % Apply 1 application topically 2 (two) times daily. 30 g 0   triamterene-hydrochlorothiazide (MAXZIDE-25) 37.5-25 MG tablet Take 0.5 tablets daily by mouth. 90 tablet 3   venlafaxine XR (EFFEXOR-XR) 150 MG 24 hr capsule TAKE 2 CAPSULES EVERY DAY WITH BREAKFAST 180 capsule 1   No current facility-administered medications for this visit.    PAST MEDICAL HISTORY: Past Medical History:  Diagnosis Date   Allergy    pollen and environmental   Anxiety    Arthritis    Asthma    Bipolar 1 disorder ([place])    Bipolar 1 disorder ([facility])    Breast cancer ([place])    Cataract    COPD (chronic obstructive pulmonary disease) ([facility])    Depression    Dysrhythmia    palpitations   Dysrhythmia    atrial fibrillation   Fibromyalgia    Finger fracture 2011   GERD (gastroesophageal reflux disease)    Heart murmur    History of stress test 04/28/2011   showed normal perfusion without scar or ischemia   Hx of echocardiogram 04/28/2011   showed normal systolic function with mild diastolic dysfunction,she had trace MR but did not have frank mitral valve  prolapse demonstrated. She had mild pulmonary hypertension with an estimated RV systolic pressure at 34 mm.   Hyperlipemia    Meniere disease    MVP (mitral valve prolapse)    Neuromuscular disorder ([place])    fibromyalgia    PAST SURGICAL HISTORY: Past Surgical History:  Procedure Laterality Date   ANKLE SURGERY     left x4   BREAST LUMPECTOMY Right 2011   BREAST SURGERY  2011   lumpectomy right breast    COLONOSCOPY     COLONOSCOPY WITH PROPOFOL N/A 02/25/2014   Procedure: COLONOSCOPY WITH PROPOFOL;  Surgeon: Garlan Fair, MD;  Location: [facility];  Service: Endoscopy;  Laterality: N/A;   deviated septum     ECTOPIC PREGNANCY SURGERY     ELBOW SURGERY     right elbow   EYE SURGERY Bilateral    cataract surgery   fibroid     2-3 fibroid adenomas removed   HAND SURGERY Left 2020   fall from Dog, pinkie finger, ring finger, middle finger   JOINT REPLACEMENT  2011   right knee    KNEE SURGERY     left knee   POLYPECTOMY     SHOULDER ARTHROSCOPY WITH BICEPSTENOTOMY Right 09/25/2015   Procedure: SHOULDER ARTHROSCOPY WITH BICEPSTENOTOMY;  Surgeon: Ninetta Lights, MD;  Location: [facility];  Service: Orthopedics;  Laterality: Right;   SHOULDER ARTHROSCOPY WITH DISTAL CLAVICLE RESECTION Right 09/25/2015   Procedure: SHOULDER ARTHROSCOPY WITH DISTAL CLAVICLE RESECTION;  Surgeon: Ninetta Lights, MD;  Location: [facility];  Service: Orthopedics;  Laterality: Right;   SHOULDER ARTHROSCOPY WITH SUBACROMIAL DECOMPRESSION Right 09/25/2015   Procedure: RIGHT SHOULDER ARTHROSCOPY DEBRIDEMENT,ACROMIOPLASTY,DISTAL CLAVICAL EXCISION, RELEASE OF BICEPS TENDON;  Surgeon: Ninetta Lights, MD;  Location: [facility];  Service: Orthopedics;  Laterality: Right;   TMJ ARTHROPLASTY     TONSILLECTOMY     TOTAL SHOULDER ARTHROPLASTY Left 01/05/2016   UTERINE FIBROID SURGERY     polups and fibroids removed    WRIST SURGERY     left    FAMILY  HISTORY: Family History  Problem Relation Age of Onset   Breast cancer  Mother        biological mother   Mitral valve prolapse Mother    Heart Problems Mother    Cancer Mother    Colon cancer Maternal Grandfather    Esophageal cancer Neg Hx    Rectal cancer Neg Hx    Stomach cancer Neg Hx     SOCIAL HISTORY: Social History   Socioeconomic History   Marital status: Single    Spouse name: Not on file   Number of children: Not on file   Years of education: Not on file   Highest education level: Not on file  Occupational History   Not on file  Tobacco Use   Smoking status: Every Day    Packs/day: 1.50    Years: 40.00    Total pack years: 60.00    Types: Cigarettes   Smokeless tobacco: Never   Tobacco comments:    Cannot afford chantix. Working on getting discount from drug compny  Vaping Use   Vaping Use: Some days   Start date: 06/23/2020   Substances: Nicotine  Substance and Sexual Activity   Alcohol use: Not Currently    Comment: less than monthly   Drug use: No   Sexual activity: Not on file  Other Topics Concern   Not on file  Social History Narrative   Not on file   Social Determinants of Health   Financial Resource Strain: Not on file  Food Insecurity: Not on file  Transportation Needs: Not on file  Physical Activity: Not on file  Stress: Not on file  Social Connections: Not on file  Intimate Partner Violence: Not on file      Marcial Pacas, M.D. Ph.D.  [facility] [address], [place], [place] [address] Ph: [phone] Fax: [phone]  CC:  Harrison Mons, [address] [place],  [place] [id number]  Harrison Mons, [place]

## 2022-04-14 ENCOUNTER — Telehealth: Payer: Self-pay | Admitting: Neurology

## 2022-04-14 NOTE — Telephone Encounter (Signed)
Mcarthur Rossetti Josem Kaufmann: [id number] exp. 04/12/22-05/12/22 sent to [facility] [phone]

## 2022-04-26 ENCOUNTER — Telehealth: Payer: Self-pay | Admitting: Neurology

## 2022-04-26 ENCOUNTER — Other Ambulatory Visit: Payer: Self-pay | Admitting: Neurology

## 2022-04-26 MED ORDER — ALPRAZOLAM 0.25 MG PO TABS: 0.2500 mg | ORAL_TABLET | ORAL | 0 refills | Status: DC

## 2022-04-26 NOTE — Telephone Encounter (Signed)
Pt called back. Stated her MRI is scheduled for 3 pm today. Pt is asking if prescription can be sent to pharmacy really soon.

## 2022-04-26 NOTE — Telephone Encounter (Signed)
Pt called. Stated she has a MRI scheduled for today, pt is requesting medication to relax her during her MRI.

## 2022-05-05 NOTE — Progress Notes (Signed)
Cardiology Clinic Note   Patient Name: Anita GipMary A Arroyo Date of Encounter: 05/07/2022  Primary Care Provider:  Porfirio OarJeffery, [place], PA Primary Cardiologist:  Nicki Guadalajarahomas Kelly, MD  Patient Profile    Anita Arroyo 62 year old female presents to the clinic today for follow-up evaluation of her essential hypertension and aortic atherosclerosis.  Past Medical History    Past Medical History:  Diagnosis Date   Allergy    pollen and environmental   Anxiety    Arthritis    Asthma    Bipolar 1 disorder    Bipolar 1 disorder    Breast cancer    Cataract    COPD (chronic obstructive pulmonary disease)    Depression    Dysrhythmia    palpitations   Dysrhythmia    atrial fibrillation   Fibromyalgia    Finger fracture 2011   GERD (gastroesophageal reflux disease)    Heart murmur    History of stress test 04/28/2011   showed normal perfusion without scar or ischemia   Hx of echocardiogram 04/28/2011   showed normal systolic function with mild diastolic dysfunction,she had trace MR but did not have frank mitral valve prolapse demonstrated. She had mild pulmonary hypertension with an estimated RV systolic pressure at 34 mm.   Hyperlipemia    Meniere disease    MVP (mitral valve prolapse)    Neuromuscular disorder    fibromyalgia   Past Surgical History:  Procedure Laterality Date   ANKLE SURGERY     left x4   BREAST LUMPECTOMY Right 2011   BREAST SURGERY  2011   lumpectomy right breast    COLONOSCOPY     COLONOSCOPY WITH PROPOFOL N/A 02/25/2014   Procedure: COLONOSCOPY WITH PROPOFOL;  Surgeon: Charolett BumpersMartin K Johnson, MD;  Location: [facility];  Service: Endoscopy;  Laterality: N/A;   deviated septum     ECTOPIC PREGNANCY SURGERY     ELBOW SURGERY     right elbow   EYE SURGERY Bilateral    cataract surgery   fibroid     2-3 fibroid adenomas removed   HAND SURGERY Left 2020   fall from Dog, pinkie finger, ring finger, middle finger   JOINT REPLACEMENT  2011   right knee    KNEE  SURGERY     left knee   POLYPECTOMY     SHOULDER ARTHROSCOPY WITH BICEPSTENOTOMY Right 09/25/2015   Procedure: SHOULDER ARTHROSCOPY WITH BICEPSTENOTOMY;  Surgeon: Loreta Aveaniel F Murphy, MD;  Location: [facility];  Service: Orthopedics;  Laterality: Right;   SHOULDER ARTHROSCOPY WITH DISTAL CLAVICLE RESECTION Right 09/25/2015   Procedure: SHOULDER ARTHROSCOPY WITH DISTAL CLAVICLE RESECTION;  Surgeon: Loreta Aveaniel F Murphy, MD;  Location: [facility];  Service: Orthopedics;  Laterality: Right;   SHOULDER ARTHROSCOPY WITH SUBACROMIAL DECOMPRESSION Right 09/25/2015   Procedure: RIGHT SHOULDER ARTHROSCOPY DEBRIDEMENT,ACROMIOPLASTY,DISTAL CLAVICAL EXCISION, RELEASE OF BICEPS TENDON;  Surgeon: Loreta Aveaniel F Murphy, MD;  Location: [facility];  Service: Orthopedics;  Laterality: Right;   TMJ ARTHROPLASTY     TONSILLECTOMY     TOTAL SHOULDER ARTHROPLASTY Left 01/05/2016   UTERINE FIBROID SURGERY     polups and fibroids removed    WRIST SURGERY     left    Allergies  Allergies  Allergen Reactions   Contrast Media [Iodinated Contrast Media] Anaphylaxis    Anaphylactic shock.   Iodine Shortness Of Breath   Ezetimibe Other (See Comments)    "I can't take any cholesterol medicines." "I can't take any cholesterol medicines."  Statins Other (See Comments)    Body aches, constipation AND DEPRESSION Body aches, constipation Body aches, constipation AND DEPRESSION   Sulfa Antibiotics Nausea And Vomiting   Adhesive [Tape] Other (See Comments)    Reaction:  Blisters    Celecoxib     "I climb the walls"   Augmentin [Amoxicillin-Pot Clavulanate] Rash and Other (See Comments)    Has patient had a PCN reaction causing immediate rash, facial/tongue/throat swelling, SOB or lightheadedness with hypotension: No Has patient had a PCN reaction causing severe rash involving mucus membranes or skin necrosis: No Has patient had a PCN reaction that required hospitalization No Has  patient had a PCN reaction occurring within the last 10 years: No If all of the above answers are "NO", then may proceed with Cephalosporin use.   Ceclor [Cefaclor] Rash   Moxifloxacin Rash   Sulfonamide Derivatives Nausea And Vomiting    History of Present Illness    Anita Arroyo is a PMH of coronary atherosclerosis, COPD, hyperlipidemia, aortic atherosclerosis, breast CA.  Palpitations, depression, statin intolerance, and tobacco use.  Nuclear stress test 7/18 showed an EF of 55-65% no ischemia and low risk.  Echocardiogram 6/18 showed normal systolic function and G1 DD.  She previously reported a history of mitral valve prolapse.  Her echocardiogram 4/13 showed normal systolic function, G1 DD, trace mitral regurgitation and frank mitral valve prolapse was not demonstrated.  She was noted to have mild pulmonary hypertension.  Her nuclear stress test showed normal perfusion at that time.  She was seen by Dr. Tresa Endo 06/03/2021.  During that time she denied chest pain but did note some right arm sensation that was nonexertional.  She had been eating a healthy diet and was drinking protein drinks.  She continued to smoke.  She continued to take atenolol 50 mg in the morning and 20 5 in the evening as well as triamterene/HCTZ daily.  She was taking Repatha 140 mg every 2 weeks.  Her EKG showed normal sinus rhythm 65 bpm with no ST or T wave changes.  She had been successful with weight loss and lost from 180 pounds down to 154 pounds.  She presents to the clinic today for follow-up evaluation and states she is doing okay.  She is physically active and continues to do weight watchers.  Her weight today is 127.2 pounds.  She reports recently she gained a little bit of weight.  We reviewed her previous stress testing and echocardiogram.  She expressed understanding.  Her blood pressure is well-controlled at 122/64.  She is tolerating her medications well without side effects.  She does note that she does have  occasional neck twitching.  This appears to be musculoskeletal.  I will request her recent labs from her PCP and plan follow-up in 1 year.  Today she denies chest pain, shortness of breath, lower extremity edema, fatigue, increased palpitations, melena, hematuria, hemoptysis, diaphoresis, weakness, presyncope, syncope, orthopnea, and PND.    Home Medications    Prior to Admission medications   Medication Sig Start Date End Date Taking? Authorizing Provider  albuterol (VENTOLIN HFA) 108 (90 Base) MCG/ACT inhaler Inhale 2 puffs into the lungs every 4 (four) hours as needed for wheezing or shortness of breath (cough, shortness of breath or wheezing.). 02/01/19   Nyoka Cowden, MD  Alcohol Swabs (B-D SINGLE USE SWABS REGULAR) PADS  12/31/16   [provider]  ALPRAZolam Prudy Feeler) 0.25 MG tablet Take 1 tablet (0.25 mg total) by mouth as directed.  Takle 1 tablet 30 mins prior to mri and may repeat x 1 04/26/22   Micki Riley, MD  ARIPiprazole (ABILIFY) 5 MG tablet Take 1 tablet (5 mg total) by mouth daily. 01/26/22   Corie Chiquito, PMHNP  atenolol (TENORMIN) 50 MG tablet Take 50 mg in the morning and 25 mg in the evening 04/27/17   Lennette Bihari, MD  Biotin 5 MG CAPS Take 5 mg by mouth daily.    [provider]  calcium-vitamin D (OSCAL WITH D) 500-200 MG-UNIT tablet Take 1 tablet by mouth.    [provider]  cetirizine (ZYRTEC) 10 MG tablet Take 10 mg by mouth daily.    [provider]  diclofenac sodium (VOLTAREN) 1 % GEL daily as needed. 01/26/17   [provider]  Evolocumab (REPATHA SURECLICK) 140 MG/ML SOAJ INJECT 140 MG into THE SKIN EVERY 14 DAYS 02/01/22   Lennette Bihari, MD  fluticasone [facility]) 50 MCG/ACT nasal spray USE 1 SPRAY IN [facility] NOSTRIL TWICE DAILY 11/16/16   Porfirio Oar, PA  Fluticasone-Salmeterol 113-14 MCG/ACT AEPB Inhale 1 PUFF into the lungs 2 TIMES DAILY 10/14/21   Glenford Bayley, NP  guaiFENesin (GNP MUCUS ER) 600 MG 12 hr  tablet TAKE 1 TABLET BY MOUTH 2 TIMES DAILY AS NEEDED 03/03/21   Glenford Bayley, NP  HYDROcodone-acetaminophen (NORCO/VICODIN) 5-325 MG tablet take 1 TABLET BY MOUTH EVERY 6 HOURS AS NEEDED for moderate pain 07/27/17   Porfirio Oar, PA  L-Methylfolate 15 MG TABS Take 1 tablet (15 mg total) by mouth daily. 01/26/22   Corie Chiquito, PMHNP  lamoTRIgine (LAMICTAL) 200 MG tablet TAKE 1/2 TABLET EVERY MORNING  AND TAKE 2 TABLETS AT BEDTIME 01/26/22   Corie Chiquito, PMHNP  LUTEIN PO Take by mouth daily.    [provider]  Magnesium 500 MG TABS Take 250 mg by mouth daily.    [provider]  meclizine (ANTIVERT) 25 MG tablet Take 1 tablet (25 mg total) by mouth as needed for dizziness. 11/10/16   Lennette Bihari, MD  meloxicam (MOBIC) 15 MG tablet TAKE 1 TABLET BY MOUTH ONCE A DAY AS NEEDED INFLAMMATION 01/12/19   [provider]  methocarbamol (ROBAXIN) 500 MG tablet TAKE 1 TABLET BY MOUTH EVERY 6 TO 8 HOURS AS NEEDED FOR SPASMS/MUSCLE TENSION 02/03/19   [provider]  montelukast (SINGULAIR) 10 MG tablet Take 1 tablet (10 mg total) by mouth at bedtime. 05/13/21   Glenford Bayley, NP  pantoprazole (PROTONIX) 40 MG tablet Take 1 tablet (40 mg total) by mouth daily. 04/26/17   Porfirio Oar, PA  pramipexole (MIRAPEX) 0.125 MG tablet TAKE 1 TABLET 2 TO 3 HOURS BEFORE BEDTIME, THEN MAY INCREASE AS DIRECTED TO 2 TABLETS AT BEDTIME AS TOLERATED 01/27/22   Corie Chiquito, PMHNP  traZODone (DESYREL) 100 MG tablet Take 2 tablets (200 mg total) by mouth at bedtime. 01/26/22   Corie Chiquito, PMHNP  triamcinolone cream (KENALOG) 0.1 % Apply 1 application topically 2 (two) times daily. 04/19/18   Eulis Foster, FNP  triamterene-hydrochlorothiazide (MAXZIDE-25) 37.5-25 MG tablet Take 0.5 tablets daily by mouth. 11/30/16   Porfirio Oar, PA  venlafaxine XR (EFFEXOR-XR) 150 MG 24 hr capsule TAKE 2 CAPSULES EVERY DAY WITH BREAKFAST 04/06/22   Corie Chiquito, PMHNP    Family  History    Family History  Problem Relation Age of Onset   Breast cancer Mother        biological mother   Mitral valve prolapse  Mother    Heart Problems Mother    Cancer Mother    Colon cancer Maternal Grandfather    Esophageal cancer Neg Hx    Rectal cancer Neg Hx    Stomach cancer Neg Hx    She indicated that her mother is alive. She indicated that her maternal grandmother is deceased. She indicated that her maternal grandfather is deceased. She indicated that her paternal grandmother is deceased. She indicated that her paternal grandfather is deceased. She indicated that the status of her neg hx is unknown.  Social History    Social History   Socioeconomic History   Marital status: Single    Spouse name: Not on file   Number of children: Not on file   Years of education: Not on file   Highest education level: Not on file  Occupational History   Not on file  Tobacco Use   Smoking status: Every Day    Packs/day: 1.50    Years: 40.00    Additional pack years: 0.00    Total pack years: 60.00    Types: Cigarettes   Smokeless tobacco: Never   Tobacco comments:    Cannot afford chantix. Working on getting discount from drug compny  Vaping Use   Vaping Use: Some days   Start date: 06/23/2020   Substances: Nicotine  Substance and Sexual Activity   Alcohol use: Not Currently    Comment: less than monthly   Drug use: No   Sexual activity: Not on file  Other Topics Concern   Not on file  Social History Narrative   Not on file   Social Determinants of Health   Financial Resource Strain: Not on file  Food Insecurity: Not on file  Transportation Needs: Not on file  Physical Activity: Not on file  Stress: Not on file  Social Connections: Not on file  Intimate Partner Violence: Not on file     Review of Systems    General:  No chills, fever, night sweats or weight changes.  Cardiovascular:  No chest pain, dyspnea on exertion, edema, orthopnea, palpitations,  paroxysmal nocturnal dyspnea. Dermatological: No rash, lesions/masses Respiratory: No cough, dyspnea Urologic: No hematuria, dysuria Abdominal:   No nausea, vomiting, diarrhea, bright red blood per rectum, melena, or hematemesis Neurologic:  No visual changes, wkns, changes in mental status. All other systems reviewed and are otherwise negative except as noted above.  Physical Exam    VS:  BP 122/64 (BP Location: Left Arm, Patient Position: Sitting, Cuff Size: Normal)   Pulse 66   Ht 5' 2.5" (1.588 m)   Wt 127 lb 3.2 oz (57.7 kg)   LMP 05/30/2011   BMI 22.89 kg/m  , BMI Body mass index is 22.89 kg/m. GEN: Well nourished, well developed, in no acute distress. HEENT: normal. Neck: Supple, no JVD, carotid bruits, or masses. Cardiac: RRR, no murmurs, rubs, or gallops. No clubbing, cyanosis, edema.  Radials/DP/PT 2+ and equal bilaterally.  Respiratory:  Respirations regular and unlabored, clear to auscultation bilaterally. GI: Soft, nontender, nondistended, BS + x 4. MS: no deformity or atrophy. Skin: warm and dry, no rash. Neuro:  Strength and sensation are intact. Psych: Normal affect.  Accessory Clinical Findings    Recent Labs: No results found for requested labs within last 365 days.   Recent Lipid Panel    Component Value Date/Time   CHOL 235 (H) 11/25/2017 1155   TRIG 113 11/25/2017 1155   HDL 70 11/25/2017 1155   CHOLHDL 3.4 11/25/2017 1155  CHOLHDL 2.9 11/22/2013 0904   VLDL 16 11/22/2013 0904   LDLCALC 142 (H) 11/25/2017 1155         ECG personally reviewed by me today-normal sinus rhythm incomplete right bundle branch block 66 bpm- No acute changes  Echocardiogram 07/08/2016 Study Conclusions   - Left ventricle: The cavity size was normal. Wall thickness was    normal. Systolic function was normal. The estimated ejection    fraction was in the range of 60% to 65%. Doppler parameters are    consistent with abnormal left ventricular relaxation (grade 1     diastolic dysfunction).   -------------------------------------------------------------------  Study data:  No prior study was available for comparison.  Study  status:  Routine.  Procedure:  The patient reported no pain pre or  post test. Transthoracic echocardiography. Image quality was  adequate.  Study completion:  There were no complications.  Transthoracic echocardiography.  M-mode, complete 2D, 3D, spectral  Doppler, and color Doppler.  Birthdate:  Patient birthdate:  [birth year].  Age:  Patient is 62 yr old.  Sex:  Gender: female.  BMI: 26.3 kg/m^2.  Blood pressure:     106/82  Patient status:  Outpatient.  Study date:  Study date: 07/08/2016. Study time: 02:58  PM.  Location:  [facility]   -------------------------------------------------------------------   -------------------------------------------------------------------  Left ventricle:  The cavity size was normal. Wall thickness was  normal. Systolic function was normal. The estimated ejection  fraction was in the range of 60% to 65%. The transmitral flow  pattern was normal. The deceleration time of the early transmitral  flow velocity was normal. The pulmonary vein flow pattern was  normal. The tissue Doppler parameters were normal. Doppler  parameters are consistent with abnormal left ventricular relaxation  (grade 1 diastolic dysfunction).   -------------------------------------------------------------------  Aortic valve:   Mildly thickened leaflets.  Doppler:  There was no  regurgitation.   -------------------------------------------------------------------  Mitral valve:   Mildly thickened leaflets .  Doppler:  There was  trivial regurgitation.    Valve area by pressure half-time: 2.72  cm^2. Indexed valve area by pressure half-time: 1.42 cm^2/m^2.   -------------------------------------------------------------------  Left atrium:  The atrium was normal in size.    -------------------------------------------------------------------  Right ventricle:  The cavity size was normal. Wall thickness was  normal. Systolic function was normal.   -------------------------------------------------------------------  Tricuspid valve:   Structurally normal valve.   Leaflet separation  was normal.  Doppler:  Transvalvular velocity was within the normal  range. There was no regurgitation.   -------------------------------------------------------------------  Right atrium:  The atrium was normal in size.   -------------------------------------------------------------------  Pericardium: There was no pericardial effusion.   -------------------------------------------------------------------  Systemic veins:  Inferior vena cava: The vessel was normal in size. The  respirophasic diameter changes were in the normal range (>= 50%),  consistent with normal central venous pressure.   Nuclear stress test 08/12/2016  The left ventricular ejection fraction is normal (55-65%). Nuclear stress EF: 58%. There was no ST segment deviation noted during stress. No T wave inversion was noted during stress. The study is normal. This is a low risk study. Assessment & Plan   1.  Coronary atherosclerosis-no chest pain today.  Denies recent episodes of arm neck back or chest discomfort.  Nuclear stress test 7/18 was low risk and showed no ST or T wave changes. Continue current medication regimen Heart healthy low-sodium diet-salty 6 given Increase physical activity as tolerated  Hyperlipidemia- She is statin intolerant.  Recent labs  drawn with PCP Heart healthy low-sodium high-fiber diet Continue Repatha  COPD-breathing stable.  Continues to smoke. Continue Singulair, Flonase-salmeterol Follows with pulmonology  Palpitations-continues with brief intermittent periods of palpitations usually noticing at night. Continue beta-blocker therapy Avoid triggers caffeine,  chocolate, EtOH, dehydration etc.  Aortic atherosclerosis-noted on CT. Continue Repatha Heart healthy low-sodium high-fiber diet Increase physical activity as tolerated  Tobacco use-continues to smoke 2 packs per day. Tobacco cessation strongly recommended Tobacco cessation information given  Disposition: Follow-up with Dr. Tresa Endo or me in 12 months.   Thomasene Ripple. Toluwanimi Radebaugh NP-C     05/07/2022, 3:42 PM [facility] [address] Office [phone] Fax [phone]    I spent 14 minutes examining this patient, reviewing medications, and using patient centered shared decision making involving her cardiac care.  Prior to her visit I spent greater than 20 minutes reviewing her past medical history,  medications, and prior cardiac tests.

## 2022-05-07 ENCOUNTER — Encounter: Payer: Self-pay | Admitting: General Practice

## 2022-05-07 ENCOUNTER — Ambulatory Visit: Payer: Medicare HMO | Attending: General Practice | Admitting: General Practice

## 2022-05-07 VITALS — BP 122/64 | HR 66 | Ht 62.5 in | Wt 127.2 lb

## 2022-05-07 DIAGNOSIS — R002 Palpitations: Secondary | ICD-10-CM

## 2022-05-07 DIAGNOSIS — Z720 Tobacco use: Secondary | ICD-10-CM

## 2022-05-07 DIAGNOSIS — I700 Atherosclerosis of aorta: Secondary | ICD-10-CM

## 2022-05-07 DIAGNOSIS — E785 Hyperlipidemia, unspecified: Secondary | ICD-10-CM

## 2022-05-07 DIAGNOSIS — I2510 Atherosclerotic heart disease of native coronary artery without angina pectoris: Secondary | ICD-10-CM

## 2022-05-07 DIAGNOSIS — J449 Chronic obstructive pulmonary disease, unspecified: Secondary | ICD-10-CM | POA: Diagnosis not present

## 2022-05-07 NOTE — Patient Instructions (Signed)
Medication Instructions:  The current medical regimen is effective;  continue present plan and medications as directed. Please refer to the Current Medication list given to you today.  *If you need a refill on your cardiac medications before your next appointment, please call your pharmacy*  Lab Work: NONE If you have labs (blood work) drawn today and your tests are completely normal, you will receive your results only by:  MyChart Message (if you have MyChart) OR  A paper copy in the mail If you have any lab test that is abnormal or we need to change your treatment, we will call you to review the results.  Other Instructions INCREASE PHYSICAL AS TOLERATED  PLEASE READ AND FOLLOW ATTACHED CESSATION TIPS  Follow-Up: At [facility], you and your health needs are our priority.  As part of our continuing mission to provide you with exceptional heart care, we have created designated Provider Care Teams.  These Care Teams include your primary Cardiologist (physician) and Advanced Practice Providers (APPs -  Physician Assistants and Nurse Practitioners) who all work together to provide you with the care you need, when you need it.  Your next appointment:   12 month(s) PLEASE CALL 3-4 MONTHS IN ADVANCE  Provider:   Nicki Guadalajara, MD          Steps to Quit Smoking Smoking tobacco is the leading cause of preventable death. It can affect almost every organ in the body. Smoking puts you and people around you at risk for many serious, long-lasting (chronic) diseases. Quitting smoking can be hard, but it is one of the best things that you can do for your health. It is never too late to quit. Do not give up if you cannot quit the first time. Some people need to try many times to quit. Do your best to stick to your quit plan, and talk with your doctor if you have any questions or concerns. How do I get ready to quit? Pick a date to quit. Set a date within the next 2 weeks to give you time to  prepare. Write down the reasons why you are quitting. Keep this list in places where you will see it often. Tell your family, friends, and co-workers that you are quitting. Their support is important. Talk with your doctor about the choices that may help you quit. Find out if your health insurance will pay for these treatments. Know the people, places, things, and activities that make you want to smoke (triggers). Avoid them. What first steps can I take to quit smoking? Throw away all cigarettes at home, at work, and in your car. Throw away the things that you use when you smoke, such as ashtrays and lighters. Clean your car. Empty the ashtray. Clean your home, including curtains and carpets. What can I do to help me quit smoking? Talk with your doctor about taking medicines and seeing a counselor. You are more likely to succeed when you do both. If you are pregnant or breastfeeding: Talk with your doctor about counseling or other ways to quit smoking. Do not take medicine to help you quit smoking unless your doctor tells you to. Quit right away Quit smoking completely, instead of slowly cutting back on how much you smoke over a period of time. Stopping smoking right away may be more successful than slowly quitting. Go to counseling. In-person is best if this is an option. You are more likely to quit if you go to counseling sessions regularly. Take  medicine You may take medicines to help you quit. Some medicines need a prescription, and some you can buy over-the-counter. Some medicines may contain a drug called nicotine to replace the nicotine in cigarettes. Medicines may: Help you stop having the desire to smoke (cravings). Help to stop the problems that come when you stop smoking (withdrawal symptoms). Your doctor may ask you to use: Nicotine patches, gum, or lozenges. Nicotine inhalers or sprays. Non-nicotine medicine that you take by mouth. Find resources Find resources and other ways  to help you quit smoking and remain smoke-free after you quit. They include: Online chats with a Veterinary surgeon. Phone quitlines. Printed Materials engineer. Support groups or group counseling. Text messaging programs. Mobile phone apps. Use apps on your mobile phone or tablet that can help you stick to your quit plan. Examples of free services include Quit Guide from the CDC and smokefree.gov  What can I do to make it easier to quit?  Talk to your family and friends. Ask them to support and encourage you. Call a phone quitline, such as 1-800-QUIT-NOW, reach out to support groups, or work with a Veterinary surgeon. Ask people who smoke to not smoke around you. Avoid places that make you want to smoke, such as: Bars. Parties. Smoke-break areas at work. Spend time with people who do not smoke. Lower the stress in your life. Stress can make you want to smoke. Try these things to lower stress: Getting regular exercise. Doing deep-breathing exercises. Doing yoga. Meditating. What benefits will I see if I quit smoking? Over time, you may have: A better sense of smell and taste. Less coughing and sore throat. A slower heart rate. Lower blood pressure. Clearer skin. Better breathing. Fewer sick days. Summary Quitting smoking can be hard, but it is one of the best things that you can do for your health. Do not give up if you cannot quit the first time. Some people need to try many times to quit. When you decide to quit smoking, make a plan to help you succeed. Quit smoking right away, not slowly over a period of time. When you start quitting, get help and support to keep you smoke-free. This information is not intended to replace advice given to you by your health care provider. Make sure you discuss any questions you have with your health care provider. Document Revised: 01/02/2021 Document Reviewed: 01/02/2021 Elsevier Patient Education  2023 [employer].

## 2022-05-11 ENCOUNTER — Ambulatory Visit: Payer: Self-pay | Admitting: Neurology

## 2022-05-11 ENCOUNTER — Telehealth: Payer: Self-pay | Admitting: Neurology

## 2022-05-11 NOTE — Telephone Encounter (Signed)
Pt signed medical release form

## 2022-05-11 NOTE — Telephone Encounter (Signed)
Pt is requesting our office call where her MRI was done, [facility] [phone], and get her MRI results which was ordered by Dr. Terrace Arabia. Pt is wanting to go over results and states she in anxious about what results are. Would like a call back if there is an issue getting results.

## 2022-05-12 NOTE — Telephone Encounter (Signed)
Received MRI results form medical records and will place in providers office for review

## 2022-05-14 ENCOUNTER — Ambulatory Visit: Payer: Medicare HMO | Admitting: Primary Care

## 2022-05-18 NOTE — Telephone Encounter (Signed)
MRI cervical spine at [facility]. If we do not have disc,   Will advice patient to get disc, add her at clinic to review films together.   MRI of the cervical spine showed multilevel degenerative changes most noticeable at C4-5, with moderate canal stenosis, variable degree of foraminal narrowing   IMPRESSION:  Multilevel degenerative changes as described above. 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

## 2022-05-18 NOTE — Telephone Encounter (Signed)
Tomorrow at April 24 2pm would be fine

## 2022-05-18 NOTE — Telephone Encounter (Signed)
Pt called to follow-up on MRI. She is requesting a call back from nurse.

## 2022-05-19 ENCOUNTER — Encounter: Payer: Self-pay | Admitting: Neurology

## 2022-05-19 ENCOUNTER — Ambulatory Visit: Payer: Medicare HMO | Admitting: Neurology

## 2022-05-19 VITALS — BP 107/57 | HR 72 | Ht 62.0 in | Wt 128.0 lb

## 2022-05-19 DIAGNOSIS — R269 Unspecified abnormalities of gait and mobility: Secondary | ICD-10-CM | POA: Diagnosis not present

## 2022-05-19 DIAGNOSIS — M542 Cervicalgia: Secondary | ICD-10-CM

## 2022-05-19 DIAGNOSIS — M47812 Spondylosis without myelopathy or radiculopathy, cervical region: Secondary | ICD-10-CM

## 2022-05-19 NOTE — Progress Notes (Signed)
Chief Complaint  Patient presents with   Follow-up    Rm 14 alone Discuss results      ASSESSMENT AND PLAN  Anita Arroyo is a 63 y.o. female  Worsening neck pain,  MRI of cervical spine showed multilevel degenerative changes, most noticeable at C3-4, with moderate canal stenosis variable degree of foraminal narrowing Hyperreflexia, but no other significant myelopathic symptoms or findings, Not surgical candidate, she also performed conservative treatment, advised patient to call back clinic for worsening symptoms   DIAGNOSTIC DATA (LABS, IMAGING, TESTING) - I reviewed patient records, labs, notes, testing and imaging myself where available.   MEDICAL HISTORY:  Anita Arroyo, is a 62 year old female seen in request by primary care PA Porfirio Oar, for evaluation of tendency leaning towards the right side, initial evaluation was on April 06, 2022  I reviewed and summarized the referring note. PMHx Bipolar SVT COPD, smoke 2ppd GERD Left ankle fracture in 2001,  Left shoulder replacement.  She works as a Engineer, structural, used to be physically active, around September 2023, she noticed mild balance issue, tends to lean her body towards the right side, also complains of worsening neck pain, radiating pain to bilateral shoulder, received some physical therapy with some improvement,  She denies bilateral upper extremity sensorimotor deficit  UPDATE April 24th 2024:  Personally reviewed MRI cervical April 1st, 2024 from [facility]  Multilevel degenerative changes as described above. 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   She has neck pain, shoulder  muscle stiffness, mildly unsteady gait, not at fall risk, no incontinence,  not a  surgical candidate yet, she also prefer conservative treatment,  PHYSICAL EXAM:   Vitals:   05/19/22 1413  BP: (!) 107/57  Pulse: 72  Weight: 128 lb (58.1 kg)  Height: 5\' 2"  (1.575 m)     Body mass index is 23.41 kg/m.  PHYSICAL EXAMNIATION:  Gen: NAD, conversant, well nourised, well groomed                     Cardiovascular: Regular rate rhythm, no peripheral edema, warm, nontender. Eyes: Conjunctivae clear without exudates or hemorrhage Neck: Supple, no carotid bruits.  Mild right tilt Pulmonary: Clear to auscultation bilaterally   NEUROLOGICAL EXAM:  MENTAL STATUS: Speech/cognition: Awake, alert, oriented to history taking and casual conversation,  CRANIAL NERVES: CN II: Visual fields are full to confrontation. Pupils are round equal and briskly reactive to light. CN III, IV, VI: extraocular movement are normal. No ptosis. CN V: Facial sensation is intact to light touch CN VII: Face is symmetric with normal eye closure  CN VIII: Hearing is normal to causal conversation. CN IX, X: Phonation is normal. CN XI: Head turning and shoulder shrug are intact  MOTOR: There is no pronator drift of out-stretched arms. Muscle bulk and tone are normal. Muscle strength is normal.  REFLEXES: Reflexes are 2+ and symmetric at the biceps, triceps, 3/3 knees, and ankles. Plantar responses are flexor.  SENSORY: Intact to light touch, pinprick and vibratory sensation are intact in fingers and toes.  COORDINATION: There is no trunk or limb dysmetria noted.  GAIT/STANCE: Can get up from seated position arm crossed, cautious,  REVIEW OF SYSTEMS:  Full 14  system review of systems performed and notable only for as above All other review of systems were negative.   ALLERGIES: Allergies  Allergen Reactions   Contrast Media [Iodinated Contrast Media] Anaphylaxis    Anaphylactic shock.   Iodine  Shortness Of Breath   Celecoxib     "I climb the walls"  Other Reaction(s): Other (See Comments)  "I climb the walls", "I climb the walls"   Ezetimibe Other (See Comments)    "I can't take any cholesterol medicines."  Other Reaction(s): Other (See Comments)   Statins Other (See Comments)    Body aches, constipation AND DEPRESSION  Body aches, constipation  Other Reaction(s): Other (See Comments)  Body aches, constipation, Body aches, constipation AND DEPRESSION   Sulfa Antibiotics Nausea And Vomiting    Other Reaction(s): GI Intolerance   Adhesive [Tape] Other (See Comments)    Reaction:  Blisters    Augmentin [Amoxicillin-Pot Clavulanate] Rash and Other (See Comments)    Has patient had a PCN reaction causing immediate rash, facial/tongue/throat swelling, SOB or lightheadedness with hypotension: No Has patient had a PCN reaction causing severe rash involving mucus membranes or skin necrosis: No Has patient had a PCN reaction that required hospitalization No Has patient had a PCN reaction occurring within the last 10 years: No If all of the above answers are "NO", then may proceed with Cephalosporin use.   Ceclor [Cefaclor] Rash   Moxifloxacin Rash   Sulfonamide Derivatives Nausea And Vomiting    HOME MEDICATIONS: Current Outpatient Medications  Medication Sig Dispense Refill   albuterol (VENTOLIN HFA) 108 (90 Base) MCG/ACT inhaler Inhale 2 puffs into the lungs every 4 (four) hours as needed for wheezing or shortness of breath (cough, shortness of breath or wheezing.). 8 g 11   Alcohol Swabs (B-D SINGLE USE SWABS REGULAR) PADS      ARIPiprazole (ABILIFY) 5 MG tablet Take 1 tablet (5 mg total) by mouth daily. 90 tablet 1   atenolol (TENORMIN) 50 MG tablet Take 50 mg in the morning and 25 mg in the evening 135 tablet 1   Biotin 5 MG CAPS Take 5 mg by mouth daily.     calcium-vitamin D (OSCAL WITH D) 500-200 MG-UNIT tablet Take 1 tablet by mouth.     cetirizine (ZYRTEC) 10 MG  tablet Take 10 mg by mouth daily.     diclofenac sodium (VOLTAREN) 1 % GEL daily as needed.     Evolocumab (REPATHA SURECLICK) 140 MG/ML SOAJ INJECT 140 MG into THE SKIN EVERY 14 DAYS 6 mL 3   fluticasone (FLONASE) 50 MCG/ACT nasal spray USE 1 SPRAY IN EACH NOSTRIL TWICE DAILY 48 g 1   Fluticasone-Salmeterol 113-14 MCG/ACT AEPB Inhale 1 PUFF into the lungs 2 TIMES DAILY 1 each 11   guaiFENesin (GNP MUCUS ER) 600 MG 12 hr tablet TAKE 1 TABLET BY MOUTH 2 TIMES DAILY AS NEEDED 60 tablet 3   HYDROcodone-acetaminophen (NORCO/VICODIN) 5-325 MG tablet take 1 TABLET BY MOUTH EVERY 6 HOURS AS NEEDED for moderate pain 120 tablet 0   L-Methylfolate 15 MG TABS Take 1 tablet (15 mg total) by mouth daily. 90 tablet 1   lamoTRIgine (LAMICTAL) 200 MG tablet TAKE 1/2 TABLET EVERY MORNING  AND TAKE 2 TABLETS AT BEDTIME 225 tablet 1   LUTEIN PO Take by mouth daily.     Magnesium 500 MG TABS Take 250 mg by mouth daily.     meclizine (ANTIVERT) 25 MG tablet Take 1 tablet (25 mg total) by mouth as  needed for dizziness. 30 tablet 1   meloxicam (MOBIC) 15 MG tablet TAKE 1 TABLET BY MOUTH ONCE A DAY AS NEEDED INFLAMMATION     methocarbamol (ROBAXIN) 500 MG tablet TAKE 1 TABLET BY MOUTH EVERY 6 TO 8 HOURS AS NEEDED FOR SPASMS/MUSCLE TENSION     montelukast (SINGULAIR) 10 MG tablet Take 1 tablet (10 mg total) by mouth at bedtime. 90 tablet 3   pantoprazole (PROTONIX) 40 MG tablet Take 1 tablet (40 mg total) by mouth daily. 90 tablet 1   pramipexole (MIRAPEX) 0.125 MG tablet TAKE 1 TABLET 2 TO 3 HOURS BEFORE BEDTIME, THEN MAY INCREASE AS DIRECTED TO 2 TABLETS AT BEDTIME AS TOLERATED 180 tablet 3   traZODone (DESYREL) 100 MG tablet Take 2 tablets (200 mg total) by mouth at bedtime. 180 tablet 0   triamcinolone cream (KENALOG) 0.1 % Apply 1 application topically 2 (two) times daily. 30 g 0   triamterene-hydrochlorothiazide (MAXZIDE-25) 37.5-25 MG tablet Take 0.5 tablets daily by mouth. 90 tablet 3   venlafaxine XR  (EFFEXOR-XR) 150 MG 24 hr capsule TAKE 2 CAPSULES EVERY DAY WITH BREAKFAST 180 capsule 1   No current facility-administered medications for this visit.    PAST MEDICAL HISTORY: Past Medical History:  Diagnosis Date   Allergy    pollen and environmental   Anxiety    Arthritis    Asthma    Bipolar 1 disorder    Bipolar 1 disorder    Breast cancer    Cataract    COPD (chronic obstructive pulmonary disease)    Depression    Dysrhythmia    palpitations   Dysrhythmia    atrial fibrillation   Fibromyalgia    Finger fracture 2011   GERD (gastroesophageal reflux disease)    Heart murmur    History of stress test 04/28/2011   showed normal perfusion without scar or ischemia   Hx of echocardiogram 04/28/2011   showed normal systolic function with mild diastolic dysfunction,she had trace MR but did not have frank mitral valve prolapse demonstrated. She had mild pulmonary hypertension with an estimated RV systolic pressure at 34 mm.   Hyperlipemia    Meniere disease    MVP (mitral valve prolapse)    Neuromuscular disorder    fibromyalgia    PAST SURGICAL HISTORY: Past Surgical History:  Procedure Laterality Date   ANKLE SURGERY     left x4   BREAST LUMPECTOMY Right 2011   BREAST SURGERY  2011   lumpectomy right breast    COLONOSCOPY     COLONOSCOPY WITH PROPOFOL N/A 02/25/2014   Procedure: COLONOSCOPY WITH PROPOFOL;  Surgeon: Charolett Bumpers, MD;  Location: [facility];  Service: Endoscopy;  Laterality: N/A;   deviated septum     ECTOPIC PREGNANCY SURGERY     ELBOW SURGERY     right elbow   EYE SURGERY Bilateral    cataract surgery   fibroid     2-3 fibroid adenomas removed   HAND SURGERY Left 2020   fall from Dog, pinkie finger, ring finger, middle finger   JOINT REPLACEMENT  2011   right knee    KNEE SURGERY     left knee   POLYPECTOMY     SHOULDER ARTHROSCOPY WITH BICEPSTENOTOMY Right 09/25/2015   Procedure: SHOULDER ARTHROSCOPY WITH BICEPSTENOTOMY;  Surgeon:  Loreta Ave, MD;  Location: [facility];  Service: Orthopedics;  Laterality: Right;   SHOULDER ARTHROSCOPY WITH DISTAL CLAVICLE RESECTION Right 09/25/2015   Procedure: SHOULDER ARTHROSCOPY WITH  DISTAL CLAVICLE RESECTION;  Surgeon: Loreta Ave, MD;  Location: [facility];  Service: Orthopedics;  Laterality: Right;   SHOULDER ARTHROSCOPY WITH SUBACROMIAL DECOMPRESSION Right 09/25/2015   Procedure: RIGHT SHOULDER ARTHROSCOPY DEBRIDEMENT,ACROMIOPLASTY,DISTAL CLAVICAL EXCISION, RELEASE OF BICEPS TENDON;  Surgeon: Loreta Ave, MD;  Location: [facility];  Service: Orthopedics;  Laterality: Right;   TMJ ARTHROPLASTY     TONSILLECTOMY     TOTAL SHOULDER ARTHROPLASTY Left 01/05/2016   UTERINE FIBROID SURGERY     polups and fibroids removed    WRIST SURGERY     left    FAMILY HISTORY: Family History  Problem Relation Age of Onset   Breast cancer Mother        biological mother   Mitral valve prolapse Mother    Heart Problems Mother    Cancer Mother    Colon cancer Maternal Grandfather    Esophageal cancer Neg Hx    Rectal cancer Neg Hx    Stomach cancer Neg Hx     SOCIAL HISTORY: Social History   Socioeconomic History   Marital status: Single    Spouse name: Not on file   Number of children: 0   Years of education: Not on file   Highest education level: Not on file  Occupational History   Not on file  Tobacco Use   Smoking status: Every Day    Packs/day: 1.50    Years: 40.00    Additional pack years: 0.00    Total pack years: 60.00    Types: Cigarettes   Smokeless tobacco: Never   Tobacco comments:    Cannot afford chantix. Working on getting discount from drug compny  Vaping Use   Vaping Use: Some days   Start date: 06/23/2020   Substances: Nicotine  Substance and Sexual Activity   Alcohol use: Not Currently    Comment: less than monthly   Drug use: No   Sexual activity: Not Currently  Other Topics Concern   Not on  file  Social History Narrative   Not on file   Social Determinants of Health   Financial Resource Strain: Not on file  Food Insecurity: Not on file  Transportation Needs: Not on file  Physical Activity: Not on file  Stress: Not on file  Social Connections: Not on file  Intimate Partner Violence: Not on file      Levert Feinstein, M.D. Ph.D.  [facility] [address], [address] [place], [place] [address] Ph: [phone] Fax: [phone]  CC:  Porfirio Oar, PA [address] [place],  [place] [address]  Porfirio Oar, [place]

## 2022-05-19 NOTE — Telephone Encounter (Signed)
Called patient and she is agreeable to bringing the disc and seeing provider at 2 pm.

## 2022-05-24 NOTE — Progress Notes (Unsigned)
@Patient  ID: Anita Arroyo, female    DOB: 16-Mar-1960, 62 y.o.   MRN: [id number]  No chief complaint on file.   Referring provider: Porfirio Oar, PA  HPI:  62 year old female, current everyday smoker.  Past medical history significant for COPD, HTN, GERD, chronic pain syndrome, fibromyalgia, hx breast cancer, prediabetes. Patient of Dr. Sherene Sires, last seen by pulmonary NP on on 10/28/20. Normal PFTs.   Previous [facility] pulmonary encounter: 10/28/2020 Patient presents today for overdue follow-up. During last visit she was started on Airduo 113 (fluticasone-salmeterol) in place of Symbicort 160. She is doing well today, no acute complaints. She has a chronic cough which is occasionally productive. No issues affording current maintenance inhaler, needing refill. She is still smoking.   Dyspnea: With hills/inclines  Cough: Varies, occasionally productive SABA: Rare Nocturnal symptoms: None   05/13/2021 Patient presents today for 6 month follow-up. She is doing alright today. No acute complaints. He has a chronic cough, mucinex has helped tremendously. She is complaint with Fluticasone-salmeterol inhaler twice daily. She has not needed to use her albuterol rescue inhaler recently. She started weight watchers and has lost 20 lbs. Energy level is improving with weight loss. CAT score 12  Dyspnea: With inclines/hills Cough: Chronic productive cough with clear mucus, improved with mucinex SABA: Rare Nocturnal symptoms: None    05/25/2022- Interim hx  Patient presents today for regular annual follow-up. Hx COPD. Current smoker. Maintained on Fluticasone-salmeterol one puff twice daily, prn Albuterol and Singulair.   Dyspnea:  Cough:  SABA:  Nocturnal symptoms:  CAT score:    Allergies  Allergen Reactions   Contrast Media [Iodinated Contrast Media] Anaphylaxis    Anaphylactic shock.   Iodine Shortness Of Breath   Celecoxib     "I climb the walls"  Other Reaction(s): Other (See  Comments)  "I climb the walls", "I climb the walls"   Ezetimibe Other (See Comments)    "I can't take any cholesterol medicines."  Other Reaction(s): Other (See Comments)   Statins Other (See Comments)    Body aches, constipation AND DEPRESSION  Body aches, constipation  Other Reaction(s): Other (See Comments)  Body aches, constipation, Body aches, constipation AND DEPRESSION   Sulfa Antibiotics Nausea And Vomiting    Other Reaction(s): GI Intolerance   Adhesive [Tape] Other (See Comments)    Reaction:  Blisters    Augmentin [Amoxicillin-Pot Clavulanate] Rash and Other (See Comments)    Has patient had a PCN reaction causing immediate rash, facial/tongue/throat swelling, SOB or lightheadedness with hypotension: No Has patient had a PCN reaction causing severe rash involving mucus membranes or skin necrosis: No Has patient had a PCN reaction that required hospitalization No Has patient had a PCN reaction occurring within the last 10 years: No If all of the above answers are "NO", then may proceed with Cephalosporin use.   Ceclor [Cefaclor] Rash   Moxifloxacin Rash   Sulfonamide Derivatives Nausea And Vomiting    Immunization History  Administered Date(s) Administered   Influenza, High Dose Seasonal PF 11/27/2018, 01/29/2019   Influenza, Seasonal, Injecte, Preservative Fre 10/25/2017, 11/27/2018   Influenza,inj,Quad PF,6+ Mos 10/12/2016, 10/25/2017, 11/27/2018   Influenza,inj,Quad PF,6-35 Mos 10/18/2020   Influenza-Unspecified 11/24/2019   Moderna Sars-Covid-2 Vaccination 04/17/2019, 05/15/2019, 11/27/2019, 07/22/2020   PNEUMOCOCCAL CONJUGATE-20 11/06/2020   Pfizer Covid-19 Vaccine Bivalent Booster 71yrs & up 10/23/2020   Pneumococcal Polysaccharide-23 11/25/2005   Pneumococcal-Unspecified 11/25/2005   Rabies, IM 03/13/2016, 03/16/2016, 03/20/2016, 03/27/2016   Tdap 03/13/2016   Zoster Recombinat (Shingrix) 10/19/2019, 12/24/2019,  03/14/2020    Past Medical History:   Diagnosis Date   Allergy    pollen and environmental   Anxiety    Arthritis    Asthma    Bipolar 1 disorder (HCC)    Bipolar 1 disorder (HCC)    Breast cancer (HCC)    Cataract    COPD (chronic obstructive pulmonary disease) (HCC)    Depression    Dysrhythmia    palpitations   Dysrhythmia    atrial fibrillation   Fibromyalgia    Finger fracture 2011   GERD (gastroesophageal reflux disease)    Heart murmur    History of stress test 04/28/2011   showed normal perfusion without scar or ischemia   Hx of echocardiogram 04/28/2011   showed normal systolic function with mild diastolic dysfunction,she had trace MR but did not have frank mitral valve prolapse demonstrated. She had mild pulmonary hypertension with an estimated RV systolic pressure at 34 mm.   Hyperlipemia    Meniere disease    MVP (mitral valve prolapse)    Neuromuscular disorder (HCC)    fibromyalgia    Tobacco History: Social History   Tobacco Use  Smoking Status Every Day   Packs/day: 1.50   Years: 40.00   Additional pack years: 0.00   Total pack years: 60.00   Types: Cigarettes  Smokeless Tobacco Never  Tobacco Comments   Cannot afford chantix. Working on getting discount from drug compny   Ready to quit: Not Answered Counseling given: Not Answered Tobacco comments: Cannot afford chantix. Working on getting discount from drug compny   Outpatient Medications Prior to Visit  Medication Sig Dispense Refill   albuterol (VENTOLIN HFA) 108 (90 Base) MCG/ACT inhaler Inhale 2 puffs into the lungs every 4 (four) hours as needed for wheezing or shortness of breath (cough, shortness of breath or wheezing.). 8 g 11   Alcohol Swabs (B-D SINGLE USE SWABS REGULAR) PADS      ARIPiprazole (ABILIFY) 5 MG tablet Take 1 tablet (5 mg total) by mouth daily. 90 tablet 1   atenolol (TENORMIN) 50 MG tablet Take 50 mg in the morning and 25 mg in the evening 135 tablet 1   Biotin 5 MG CAPS Take 5 mg by mouth daily.      calcium-vitamin D (OSCAL WITH D) 500-200 MG-UNIT tablet Take 1 tablet by mouth.     cetirizine (ZYRTEC) 10 MG tablet Take 10 mg by mouth daily.     diclofenac sodium (VOLTAREN) 1 % GEL daily as needed.     Evolocumab (REPATHA SURECLICK) 140 MG/ML SOAJ INJECT 140 MG into THE SKIN EVERY 14 DAYS 6 mL 3   fluticasone (FLONASE) 50 MCG/ACT nasal spray USE 1 SPRAY IN EACH NOSTRIL TWICE DAILY 48 g 1   Fluticasone-Salmeterol 113-14 MCG/ACT AEPB Inhale 1 PUFF into the lungs 2 TIMES DAILY 1 each 11   guaiFENesin (GNP MUCUS ER) 600 MG 12 hr tablet TAKE 1 TABLET BY MOUTH 2 TIMES DAILY AS NEEDED 60 tablet 3   HYDROcodone-acetaminophen (NORCO/VICODIN) 5-325 MG tablet take 1 TABLET BY MOUTH EVERY 6 HOURS AS NEEDED for moderate pain 120 tablet 0   L-Methylfolate 15 MG TABS Take 1 tablet (15 mg total) by mouth daily. 90 tablet 1   lamoTRIgine (LAMICTAL) 200 MG tablet TAKE 1/2 TABLET EVERY MORNING  AND TAKE 2 TABLETS AT BEDTIME 225 tablet 1   LUTEIN PO Take by mouth daily.     Magnesium 500 MG TABS Take 250 mg by mouth daily.  meclizine (ANTIVERT) 25 MG tablet Take 1 tablet (25 mg total) by mouth as needed for dizziness. 30 tablet 1   meloxicam (MOBIC) 15 MG tablet TAKE 1 TABLET BY MOUTH ONCE A DAY AS NEEDED INFLAMMATION     methocarbamol (ROBAXIN) 500 MG tablet TAKE 1 TABLET BY MOUTH EVERY 6 TO 8 HOURS AS NEEDED FOR SPASMS/MUSCLE TENSION     montelukast (SINGULAIR) 10 MG tablet Take 1 tablet (10 mg total) by mouth at bedtime. 90 tablet 3   pantoprazole (PROTONIX) 40 MG tablet Take 1 tablet (40 mg total) by mouth daily. 90 tablet 1   pramipexole (MIRAPEX) 0.125 MG tablet TAKE 1 TABLET 2 TO 3 HOURS BEFORE BEDTIME, THEN MAY INCREASE AS DIRECTED TO 2 TABLETS AT BEDTIME AS TOLERATED 180 tablet 3   traZODone (DESYREL) 100 MG tablet Take 2 tablets (200 mg total) by mouth at bedtime. 180 tablet 0   triamcinolone cream (KENALOG) 0.1 % Apply 1 application topically 2 (two) times daily. 30 g 0    triamterene-hydrochlorothiazide (MAXZIDE-25) 37.5-25 MG tablet Take 0.5 tablets daily by mouth. 90 tablet 3   venlafaxine XR (EFFEXOR-XR) 150 MG 24 hr capsule TAKE 2 CAPSULES EVERY DAY WITH BREAKFAST 180 capsule 1   No facility-administered medications prior to visit.      Review of Systems  Review of Systems   Physical Exam  LMP 05/30/2011  Physical Exam   Lab Results:  CBC    Component Value Date/Time   WBC 6.4 08/12/2016 1659   WBC 10.6 (H) 03/13/2016 1720   RBC 4.53 08/12/2016 1659   RBC 4.87 03/13/2016 1720   HGB 14.2 08/12/2016 1659   HGB 13.7 08/20/2014 1117   HCT 41.9 08/12/2016 1659   HCT 40.3 08/20/2014 1117   PLT 230 08/12/2016 1659   MCV 93 08/12/2016 1659   MCV 94.1 08/20/2014 1117   MCH 31.3 08/12/2016 1659   MCH 30.6 03/13/2016 1720   MCHC 33.9 08/12/2016 1659   MCHC 34.3 03/13/2016 1720   RDW 14.1 08/12/2016 1659   RDW 13.2 08/20/2014 1117   LYMPHSABS 2.4 03/13/2016 1720   LYMPHSABS 2.0 08/20/2014 1117   MONOABS 0.9 03/13/2016 1720   MONOABS 0.5 08/20/2014 1117   EOSABS 0.0 03/13/2016 1720   EOSABS 0.1 08/20/2014 1117   BASOSABS 0.0 03/13/2016 1720   BASOSABS 0.1 08/20/2014 1117    BMET    Component Value Date/Time   NA 140 11/25/2017 1155   NA 138 08/20/2014 1117   K 4.3 11/25/2017 1155   K 4.7 08/20/2014 1117   CL 98 11/25/2017 1155   CL 104 06/29/2012 1221   CO2 27 11/25/2017 1155   CO2 26 08/20/2014 1117   GLUCOSE 84 11/25/2017 1155   GLUCOSE 82 03/13/2016 1720   GLUCOSE 92 08/20/2014 1117   GLUCOSE 107 (H) 06/29/2012 1221   BUN 17 11/25/2017 1155   BUN 16.0 08/20/2014 1117   CREATININE 0.93 11/25/2017 1155   CREATININE 0.9 08/20/2014 1117   CALCIUM 9.8 11/25/2017 1155   CALCIUM 9.4 08/20/2014 1117   GFRNONAA 68 11/25/2017 1155   GFRAA 78 11/25/2017 1155    BNP No results found for: "BNP"  ProBNP No results found for: "PROBNP"  Imaging: No results found.   Assessment & Plan:   No problem-specific Assessment &  Plan notes found for this encounter.     Glenford Bayley, NP 05/24/2022

## 2022-05-25 ENCOUNTER — Encounter: Payer: Self-pay | Admitting: Primary Care

## 2022-05-25 ENCOUNTER — Telehealth: Payer: Self-pay | Admitting: Primary Care

## 2022-05-25 ENCOUNTER — Ambulatory Visit: Payer: Medicare HMO | Admitting: Primary Care

## 2022-05-25 VITALS — BP 118/58 | HR 68 | Temp 97.9°F | Ht 62.0 in | Wt 126.4 lb

## 2022-05-25 DIAGNOSIS — F17210 Nicotine dependence, cigarettes, uncomplicated: Secondary | ICD-10-CM | POA: Diagnosis not present

## 2022-05-25 DIAGNOSIS — G4700 Insomnia, unspecified: Secondary | ICD-10-CM | POA: Diagnosis not present

## 2022-05-25 DIAGNOSIS — J449 Chronic obstructive pulmonary disease, unspecified: Secondary | ICD-10-CM | POA: Diagnosis not present

## 2022-05-25 MED ORDER — PREDNISONE 10 MG PO TABS: ORAL_TABLET | ORAL | 0 refills | Status: DC

## 2022-05-25 MED ORDER — VARENICLINE TARTRATE (STARTER) 0.5 MG X 11 & 1 MG X 42 PO TBPK: ORAL_TABLET | ORAL | 0 refills | Status: DC

## 2022-05-25 MED ORDER — ZOLPIDEM TARTRATE 5 MG PO TABS: 5.0000 mg | ORAL_TABLET | Freq: Every evening | ORAL | 0 refills | Status: DC | PRN

## 2022-05-25 MED ORDER — ALBUTEROL SULFATE HFA 108 (90 BASE) MCG/ACT IN AERS: 2.0000 | INHALATION_SPRAY | RESPIRATORY_TRACT | 1 refills | Status: AC | PRN

## 2022-05-25 NOTE — Patient Instructions (Addendum)
Recommendations: Continue Advair one puff morning and evening Use Albuterol 2 puffs every 4-6 hours as needed for breakthrough sob/wheezing  Take chantix per pack instructions (take for 12 weeks) Take Ambien 5mg  at bedtime as needed for insomnia (only take sleep aid when on chantix)  Taper amount you are smoking and pick quit date! Lung cancer screening program will reach out ot you to schedule CT of your chest   Follow-up: 6 weeks with [facility] NP   You can receive free nicotine replacement therapy ( patches, gum or mints) by calling 1-800-QUIT NOW. Please call so we can get you on the path to becoming  a non-smoker. I know it is hard, but you can do this!  Hypnosis for smoking cessation  [employer] [phone]  Acupuncture for smoking cessation  [employer] [phone]   Steps to Quit Smoking Smoking tobacco is the leading cause of preventable death. It can affect almost every organ in the body. Smoking puts you and people around you at risk for many serious, long-lasting (chronic) diseases. Quitting smoking can be hard, but it is one of the best things that you can do for your health. It is never too late to quit. Do not give up if you cannot quit the first time. Some people need to try many times to quit. Do your best to stick to your quit plan, and talk with your doctor if you have any questions or concerns. How do I get ready to quit? Pick a date to quit. Set a date within the next 2 weeks to give you time to prepare. Write down the reasons why you are quitting. Keep this list in places where you will see it often. Tell your family, friends, and co-workers that you are quitting. Their support is important. Talk with your doctor about the choices that may help you quit. Find out if your health insurance will pay for these treatments. Know the people, places, things, and activities that make you want to smoke (triggers). Avoid them. What first steps can I  take to quit smoking? Throw away all cigarettes at home, at work, and in your car. Throw away the things that you use when you smoke, such as ashtrays and lighters. Clean your car. Empty the ashtray. Clean your home, including curtains and carpets. What can I do to help me quit smoking? Talk with your doctor about taking medicines and seeing a counselor. You are more likely to succeed when you do both. If you are pregnant or breastfeeding: Talk with your doctor about counseling or other ways to quit smoking. Do not take medicine to help you quit smoking unless your doctor tells you to. Quit right away Quit smoking completely, instead of slowly cutting back on how much you smoke over a period of time. Stopping smoking right away may be more successful than slowly quitting. Go to counseling. In-person is best if this is an option. You are more likely to quit if you go to counseling sessions regularly. Take medicine You may take medicines to help you quit. Some medicines need a prescription, and some you can buy over-the-counter. Some medicines may contain a drug called nicotine to replace the nicotine in cigarettes. Medicines may: Help you stop having the desire to smoke (cravings). Help to stop the problems that come when you stop smoking (withdrawal symptoms). Your doctor may ask you to use: Nicotine patches, gum, or lozenges. Nicotine inhalers or sprays. Non-nicotine medicine that you take by mouth. Find  resources Find resources and other ways to help you quit smoking and remain smoke-free after you quit. They include: Online chats with a Veterinary surgeon. Phone quitlines. Printed Materials engineer. Support groups or group counseling. Text messaging programs. Mobile phone apps. Use apps on your mobile phone or tablet that can help you stick to your quit plan. Examples of free services include Quit Guide from the CDC and smokefree.gov  What can I do to make it easier to quit?  Talk to your  family and friends. Ask them to support and encourage you. Call a phone quitline, such as 1-800-QUIT-NOW, reach out to support groups, or work with a Veterinary surgeon. Ask people who smoke to not smoke around you. Avoid places that make you want to smoke, such as: Bars. Parties. Smoke-break areas at work. Spend time with people who do not smoke. Lower the stress in your life. Stress can make you want to smoke. Try these things to lower stress: Getting regular exercise. Doing deep-breathing exercises. Doing yoga. Meditating. What benefits will I see if I quit smoking? Over time, you may have: A better sense of smell and taste. Less coughing and sore throat. A slower heart rate. Lower blood pressure. Clearer skin. Better breathing. Fewer sick days. Summary Quitting smoking can be hard, but it is one of the best things that you can do for your health. Do not give up if you cannot quit the first time. Some people need to try many times to quit. When you decide to quit smoking, make a plan to help you succeed. Quit smoking right away, not slowly over a period of time. When you start quitting, get help and support to keep you smoke-free. This information is not intended to replace advice given to you by your health care provider. Make sure you discuss any questions you have with your health care provider. Document Revised: 01/02/2021 Document Reviewed: 01/02/2021 Elsevier Patient Education  2023 [employer].

## 2022-05-25 NOTE — Telephone Encounter (Signed)
Patient is overdue for low-dose CT with lung cancer screening program.  Last done in February 2023.  Please follow-up on this.

## 2022-05-25 NOTE — Assessment & Plan Note (Signed)
-   Patient has difficulty sleeping while on chantix, RX provided for Ambien 5mg  qhs to take as needed for insomnia. Reviewed safety precautions with patient, short term use only.

## 2022-05-25 NOTE — Assessment & Plan Note (Addendum)
-   Continues to smoke 2 ppd. She is interested in quitting, chantix has helped in the past. RX sent for chantinx started pack, plan for 12 week treatment. Advised she taper amount she smoke and pick quit date. We spent between 5-10 minutes reviewing smoking cessation. Due for LDCT in August, last done at [facility] in August 2023.

## 2022-05-25 NOTE — Telephone Encounter (Signed)
Thanks

## 2022-05-25 NOTE — Assessment & Plan Note (Addendum)
-   Stable interval; Current smoker. Chronic productive cough. Dyspnea with inclines. Rare SABA use. CAT score 14. She had some mild wheezing on exam. Following with lung cancer screening program. Continue generic Advair 1 puff twice daily, prn Albuterol 2 puffs every 4-6 hours for breakthrough shortness of breath, Singulair 10mg  daily and prn mucinex. Sending in RX prednisone 20mg  x 5 days for start of mild exacerbation. FU in 4-6 weeks or sooner if needed.

## 2022-05-27 ENCOUNTER — Other Ambulatory Visit: Payer: Self-pay | Admitting: Primary Care

## 2022-05-27 NOTE — Telephone Encounter (Signed)
Beth, please advise on this as the Rx needed to have been sent to different pharmacy than original one.

## 2022-05-28 MED ORDER — ZOLPIDEM TARTRATE 5 MG PO TABS: 5.0000 mg | ORAL_TABLET | Freq: Every evening | ORAL | 1 refills | Status: DC | PRN

## 2022-05-28 NOTE — Telephone Encounter (Signed)
Why did the dose get changed? I sent in 5mg  in to new pharmacy

## 2022-07-05 NOTE — Progress Notes (Deleted)
@Patient  ID: Anita Arroyo, female    DOB: 22-Jul-1960, 62 y.o.   MRN: [id number]  No chief complaint on file.   Referring provider: Porfirio Oar, PA  HPI:  62 year old female, current everyday smoker.  Past medical history significant for COPD, HTN, GERD, chronic pain syndrome, fibromyalgia, hx breast cancer, prediabetes. Patient of Dr. Sherene Sires, last seen by pulmonary NP on on 10/28/20. Normal PFTs.   Previous [facility] pulmonary encounter: 10/28/2020 Patient presents today for overdue follow-up. During last visit she was started on Airduo 113 (fluticasone-salmeterol) in place of Symbicort 160. She is doing well today, no acute complaints. She has a chronic cough which is occasionally productive. No issues affording current maintenance inhaler, needing refill. She is still smoking.   Dyspnea: With hills/inclines  Cough: Varies, occasionally productive SABA: Rare Nocturnal symptoms: None  05/13/2021 Patient presents today for 6 month follow-up. She is doing alright today. No acute complaints. He has a chronic cough, mucinex has helped tremendously. She is complaint with Fluticasone-salmeterol inhaler twice daily. She has not needed to use her albuterol rescue inhaler recently. She started weight watchers and has lost 20 lbs. Energy level is improving with weight loss. CAT score 12  Dyspnea: With inclines/hills Cough: Chronic productive cough with clear mucus, improved with mucinex SABA: Rare Nocturnal symptoms: None    05/25/2022 Patient presents today for regular annual follow-up. Hx COPD. Current smoker. Maintained on Fluticasone-salmeterol one puff twice daily, prn Albuterol and Singulair. She is doing well.  She currently has a lot of allergies symptoms including PND and cough. She has shortness of breath symptoms when walking up inclines/stairs. Otherwise she is not limited by her breathing. She can do all activities of daily living. She has lost 60 lbs which has help with her energy. She is  still smoking 2 packs per day. She has tried gum/patches. Chantix helped in the past. When she takes this it messes up her sleep and she needs to take a sleep aid.   Dyspnea: inclines Cough: chronic productive cough  SABA: <1x/month Nocturnal symptoms: None  CAT score: 14  07/06/2022- Interim hx  Patient presents today for 6 week follow-up             Allergies  Allergen Reactions   Contrast Media [Iodinated Contrast Media] Anaphylaxis    Anaphylactic shock.   Iodine Shortness Of Breath   Celecoxib     "I climb the walls"  Other Reaction(s): Other (See Comments)  "I climb the walls", "I climb the walls"   Ezetimibe Other (See Comments)    "I can't take any cholesterol medicines."  Other Reaction(s): Other (See Comments)   Statins Other (See Comments)    Body aches, constipation AND DEPRESSION  Body aches, constipation  Other Reaction(s): Other (See Comments)  Body aches, constipation, Body aches, constipation AND DEPRESSION   Sulfa Antibiotics Nausea And Vomiting    Other Reaction(s): GI Intolerance   Adhesive [Tape] Other (See Comments)    Reaction:  Blisters    Augmentin [Amoxicillin-Pot Clavulanate] Rash and Other (See Comments)    Has patient had a PCN reaction causing immediate rash, facial/tongue/throat swelling, SOB or lightheadedness with hypotension: No Has patient had a PCN reaction causing severe rash involving mucus membranes or skin necrosis: No Has patient had a PCN reaction that required hospitalization No Has patient had a PCN reaction occurring within the last 10 years: No If all of the above answers are "NO", then may proceed with Cephalosporin use.   Ceclor [Cefaclor]  Rash   Moxifloxacin Rash   Sulfonamide Derivatives Nausea And Vomiting    Immunization History  Administered Date(s) Administered   Fluad Quad(high Dose 65+) 10/17/2021   Influenza, High Dose Seasonal PF 11/27/2018, 01/29/2019   Influenza, Seasonal, Injecte, Preservative  Fre 10/25/2017, 11/27/2018   Influenza,inj,Quad PF,6+ Mos 10/12/2016, 10/25/2017, 11/27/2018   Influenza,inj,Quad PF,6-35 Mos 10/18/2020   Influenza-Unspecified 11/24/2019   Moderna Sars-Covid-2 Vaccination 04/17/2019, 05/15/2019, 11/27/2019, 07/22/2020   PNEUMOCOCCAL CONJUGATE-20 11/06/2020   Pfizer Covid-19 Vaccine Bivalent Booster 63yrs & up 10/23/2020, 10/30/2021   Pneumococcal Polysaccharide-23 11/25/2005   Pneumococcal-Unspecified 11/25/2005   Rabies, IM 03/13/2016, 03/16/2016, 03/20/2016, 03/27/2016   Rsv, Bivalent, Protein Subunit Rsvpref,pf Verdis Frederickson) 01/06/2022   Tdap 03/13/2016   Zoster Recombinat (Shingrix) 10/19/2019, 12/24/2019, 03/14/2020    Past Medical History:  Diagnosis Date   Allergy    pollen and environmental   Anxiety    Arthritis    Asthma    Bipolar 1 disorder (HCC)    Bipolar 1 disorder (HCC)    Breast cancer (HCC)    Cataract    COPD (chronic obstructive pulmonary disease) (HCC)    Depression    Dysrhythmia    palpitations   Dysrhythmia    atrial fibrillation   Fibromyalgia    Finger fracture 2011   GERD (gastroesophageal reflux disease)    Heart murmur    History of stress test 04/28/2011   showed normal perfusion without scar or ischemia   Hx of echocardiogram 04/28/2011   showed normal systolic function with mild diastolic dysfunction,she had trace MR but did not have frank mitral valve prolapse demonstrated. She had mild pulmonary hypertension with an estimated RV systolic pressure at 34 mm.   Hyperlipemia    Meniere disease    MVP (mitral valve prolapse)    Neuromuscular disorder (HCC)    fibromyalgia    Tobacco History: Social History   Tobacco Use  Smoking Status Every Day   Packs/day: 2.00   Years: 47.00   Additional pack years: 0.00   Total pack years: 94.00   Types: Cigarettes   Start date: 1976  Smokeless Tobacco Never  Tobacco Comments   Currently smoking 2ppd as of 05/25/22 ep   Ready to quit: Not Answered Counseling  given: Not Answered Tobacco comments: Currently smoking 2ppd as of 05/25/22 ep   Outpatient Medications Prior to Visit  Medication Sig Dispense Refill   albuterol (VENTOLIN HFA) 108 (90 Base) MCG/ACT inhaler Inhale 2 puffs into the lungs every 4 (four) hours as needed for wheezing or shortness of breath (cough, shortness of breath or wheezing.). 8 g 1   Alcohol Swabs (B-D SINGLE USE SWABS REGULAR) PADS      ARIPiprazole (ABILIFY) 5 MG tablet Take 1 tablet (5 mg total) by mouth daily. 90 tablet 1   atenolol (TENORMIN) 50 MG tablet Take 50 mg in the morning and 25 mg in the evening 135 tablet 1   Biotin 5 MG CAPS Take 5 mg by mouth daily.     calcium-vitamin D (OSCAL WITH D) 500-200 MG-UNIT tablet Take 1 tablet by mouth.     cetirizine (ZYRTEC) 10 MG tablet Take 10 mg by mouth daily.     diclofenac sodium (VOLTAREN) 1 % GEL daily as needed.     Evolocumab (REPATHA SURECLICK) 140 MG/ML SOAJ INJECT 140 MG into THE SKIN EVERY 14 DAYS 6 mL 3   fluticasone (FLONASE) 50 MCG/ACT nasal spray USE 1 SPRAY IN EACH NOSTRIL TWICE DAILY 48 g 1  Fluticasone-Salmeterol 113-14 MCG/ACT AEPB Inhale 1 PUFF into the lungs 2 TIMES DAILY 1 each 11   guaiFENesin (GNP MUCUS ER) 600 MG 12 hr tablet TAKE 1 TABLET BY MOUTH 2 TIMES DAILY AS NEEDED 60 tablet 3   HYDROcodone-acetaminophen (NORCO/VICODIN) 5-325 MG tablet take 1 TABLET BY MOUTH EVERY 6 HOURS AS NEEDED for moderate pain 120 tablet 0   L-Methylfolate 15 MG TABS Take 1 tablet (15 mg total) by mouth daily. 90 tablet 1   lamoTRIgine (LAMICTAL) 200 MG tablet TAKE 1/2 TABLET EVERY MORNING  AND TAKE 2 TABLETS AT BEDTIME 225 tablet 1   LUTEIN PO Take by mouth daily.     Magnesium 500 MG TABS Take 250 mg by mouth daily.     meclizine (ANTIVERT) 25 MG tablet Take 1 tablet (25 mg total) by mouth as needed for dizziness. 30 tablet 1   meloxicam (MOBIC) 15 MG tablet TAKE 1 TABLET BY MOUTH ONCE A DAY AS NEEDED INFLAMMATION     methocarbamol (ROBAXIN) 500 MG tablet TAKE 1  TABLET BY MOUTH EVERY 6 TO 8 HOURS AS NEEDED FOR SPASMS/MUSCLE TENSION     montelukast (SINGULAIR) 10 MG tablet Take 1 tablet (10 mg total) by mouth at bedtime. 90 tablet 3   pantoprazole (PROTONIX) 40 MG tablet Take 1 tablet (40 mg total) by mouth daily. 90 tablet 1   pramipexole (MIRAPEX) 0.125 MG tablet TAKE 1 TABLET 2 TO 3 HOURS BEFORE BEDTIME, THEN MAY INCREASE AS DIRECTED TO 2 TABLETS AT BEDTIME AS TOLERATED 180 tablet 3   predniSONE (DELTASONE) 10 MG tablet Take 2 tablets daily x 5 days 10 tablet 0   traZODone (DESYREL) 100 MG tablet Take 2 tablets (200 mg total) by mouth at bedtime. 180 tablet 0   triamcinolone cream (KENALOG) 0.1 % Apply 1 application topically 2 (two) times daily. 30 g 0   triamterene-hydrochlorothiazide (MAXZIDE-25) 37.5-25 MG tablet Take 0.5 tablets daily by mouth. 90 tablet 3   Varenicline Tartrate, Starter, (CHANTIX STARTING MONTH PAK) 0.5 MG X 11 & 1 MG X 42 TBPK Per pack instructions 53 each 0   venlafaxine XR (EFFEXOR-XR) 150 MG 24 hr capsule TAKE 2 CAPSULES EVERY DAY WITH BREAKFAST 180 capsule 1   zolpidem (AMBIEN) 5 MG tablet Take 1 tablet (5 mg total) by mouth at bedtime as needed for sleep. 30 tablet 1   No facility-administered medications prior to visit.      Review of Systems  Review of Systems   Physical Exam  LMP 05/30/2011  Physical Exam   Lab Results:  CBC    Component Value Date/Time   WBC 6.4 08/12/2016 1659   WBC 10.6 (H) 03/13/2016 1720   RBC 4.53 08/12/2016 1659   RBC 4.87 03/13/2016 1720   HGB 14.2 08/12/2016 1659   HGB 13.7 08/20/2014 1117   HCT 41.9 08/12/2016 1659   HCT 40.3 08/20/2014 1117   PLT 230 08/12/2016 1659   MCV 93 08/12/2016 1659   MCV 94.1 08/20/2014 1117   MCH 31.3 08/12/2016 1659   MCH 30.6 03/13/2016 1720   MCHC 33.9 08/12/2016 1659   MCHC 34.3 03/13/2016 1720   RDW 14.1 08/12/2016 1659   RDW 13.2 08/20/2014 1117   LYMPHSABS 2.4 03/13/2016 1720   LYMPHSABS 2.0 08/20/2014 1117   MONOABS 0.9  03/13/2016 1720   MONOABS 0.5 08/20/2014 1117   EOSABS 0.0 03/13/2016 1720   EOSABS 0.1 08/20/2014 1117   BASOSABS 0.0 03/13/2016 1720   BASOSABS 0.1 08/20/2014 1117  BMET    Component Value Date/Time   NA 140 11/25/2017 1155   NA 138 08/20/2014 1117   K 4.3 11/25/2017 1155   K 4.7 08/20/2014 1117   CL 98 11/25/2017 1155   CL 104 06/29/2012 1221   CO2 27 11/25/2017 1155   CO2 26 08/20/2014 1117   GLUCOSE 84 11/25/2017 1155   GLUCOSE 82 03/13/2016 1720   GLUCOSE 92 08/20/2014 1117   GLUCOSE 107 (H) 06/29/2012 1221   BUN 17 11/25/2017 1155   BUN 16.0 08/20/2014 1117   CREATININE 0.93 11/25/2017 1155   CREATININE 0.9 08/20/2014 1117   CALCIUM 9.8 11/25/2017 1155   CALCIUM 9.4 08/20/2014 1117   GFRNONAA 68 11/25/2017 1155   GFRAA 78 11/25/2017 1155    BNP No results found for: "BNP"  ProBNP No results found for: "PROBNP"  Imaging: No results found.   Assessment & Plan:   No problem-specific Assessment & Plan notes found for this encounter.     Glenford Bayley, NP 07/05/2022

## 2022-07-06 ENCOUNTER — Telehealth: Payer: Self-pay | Admitting: Acute Care

## 2022-07-06 ENCOUNTER — Ambulatory Visit: Payer: Medicare HMO | Admitting: Primary Care

## 2022-07-06 NOTE — Telephone Encounter (Signed)
Called and spoke to pt. Per the LDCT order placed the patient had a CT in 08/2021 and per Kandice Robinsons we are able to push out pt's LDCT till 08/2022. Advised pt of this information. Pt verbalized understanding and is agreeable to wait until 08/2022 to schedule LDCT.    Will forward to PCCs to assure pt will be called in 08/2022. Pt was adamant sh wants to be sure she has her scan on time.

## 2022-07-06 NOTE — Telephone Encounter (Signed)
Contacted patient to reschedule visit with [facility] on 6/11. Patient states she is overdue for a lung screening scan. Informed patient from the notes she was due in August, Patient states she is way overdue. Please advise on scan.

## 2022-07-13 ENCOUNTER — Ambulatory Visit: Payer: Medicare HMO | Admitting: Primary Care

## 2022-07-13 ENCOUNTER — Encounter: Payer: Self-pay | Admitting: Primary Care

## 2022-07-13 VITALS — BP 112/74 | HR 59 | Temp 97.3°F | Ht 62.5 in | Wt 133.2 lb

## 2022-07-13 DIAGNOSIS — F17210 Nicotine dependence, cigarettes, uncomplicated: Secondary | ICD-10-CM

## 2022-07-13 DIAGNOSIS — J449 Chronic obstructive pulmonary disease, unspecified: Secondary | ICD-10-CM

## 2022-07-13 NOTE — Assessment & Plan Note (Addendum)
-   Current smoker. Treated for mild exacerbation in April with prednisone. Respiratory symptoms are currently stable on fluticasone-salmeterol 113-39mcg one puff twice daily. Rare SABA use. No changes today. Smoking cessation encouraged.

## 2022-07-13 NOTE — Progress Notes (Signed)
@Patient  ID: Anita Arroyo, female    DOB: Jan 08, 1961, 62 y.o.   MRN: [id number]  Chief Complaint  Patient presents with   Follow-up    ACT 20    Referring provider: Porfirio Oar, PA  HPI: 62 year old female, current everyday smoker.  Past medical history significant for COPD, HTN, GERD, chronic pain syndrome, fibromyalgia, hx breast cancer, prediabetes. Patient of Dr. Sherene Sires.   Previous [facility] pulmonary encounter: 10/28/2020 Patient presents today for overdue follow-up. During last visit she was started on Airduo 113 (fluticasone-salmeterol) in place of Symbicort 160. She is doing well today, no acute complaints. She has a chronic cough which is occasionally productive. No issues affording current maintenance inhaler, needing refill. She is still smoking.   Dyspnea: With hills/inclines  Cough: Varies, occasionally productive SABA: Rare Nocturnal symptoms: None  05/13/2021 Patient presents today for 6 month follow-up. She is doing alright today. No acute complaints. He has a chronic cough, mucinex has helped tremendously. She is complaint with Fluticasone-salmeterol inhaler twice daily. She has not needed to use her albuterol rescue inhaler recently. She started weight watchers and has lost 20 lbs. Energy level is improving with weight loss. CAT score 12  Dyspnea: With inclines/hills Cough: Chronic productive cough with clear mucus, improved with mucinex SABA: Rare Nocturnal symptoms: None    05/25/2022 Patient presents today for regular annual follow-up. Hx COPD. Current smoker. Maintained on Fluticasone-salmeterol one puff twice daily, prn Albuterol and Singulair. She is doing well.  She currently has a lot of allergies symptoms including PND and cough. She has shortness of breath symptoms when walking up inclines/stairs. Otherwise she is not limited by her breathing. She can do all activities of daily living. She has lost 60 lbs which has help with her energy. She is still smoking 2  packs per day. She has tried gum/patches. Chantix helped in the past. When she takes this it messes up her sleep and she needs to take a sleep aid.   Dyspnea: inclines Cough: chronic productive cough  SABA: <1x/month Nocturnal symptoms: None  CAT score: 14  07/06/2022- Interim hx  Patient presents today for 6 week follow-up COPD and insomnia.  Patient is a current smoker.  Maintained on generic Advair and as needed albuterol.  She was treated for mild exacerbation in April with prednisone.  She is followed lung cancer screening program. Due for LDCT in August, last done at [facility] in August 2023.  She is interested in quitting, started on Chantix.  She has difficulty sleeping while on Chantix and was provided Rx for Ambien 5 mg to take as needed for insomnia.  She is doing very well. Breathing is alright. No acute respiratory symptoms. She has not needed albuterol. She has significantly cut back on her smoking. Target quit date is end of July. Sleep is not as good as when she does not take chantix but better with Ambien. She wakes up several times a night. This is manageable right now. Due for LDCT in August which has already been scheduled. She is going weight watchers.    Allergies  Allergen Reactions   Contrast Media [Iodinated Contrast Media] Anaphylaxis    Anaphylactic shock.   Iodine Shortness Of Breath   Celecoxib     "I climb the walls"  Other Reaction(s): Other (See Comments)  "I climb the walls", "I climb the walls"   Ezetimibe Other (See Comments)    "I can't take any cholesterol medicines."  Other Reaction(s): Other (See Comments)  Statins Other (See Comments)    Body aches, constipation AND DEPRESSION  Body aches, constipation  Other Reaction(s): Other (See Comments)  Body aches, constipation, Body aches, constipation AND DEPRESSION   Sulfa Antibiotics Nausea And Vomiting    Other Reaction(s): GI Intolerance   Adhesive [Tape] Other (See Comments)    Reaction:   Blisters    Augmentin [Amoxicillin-Pot Clavulanate] Rash and Other (See Comments)    Has patient had a PCN reaction causing immediate rash, facial/tongue/throat swelling, SOB or lightheadedness with hypotension: No Has patient had a PCN reaction causing severe rash involving mucus membranes or skin necrosis: No Has patient had a PCN reaction that required hospitalization No Has patient had a PCN reaction occurring within the last 10 years: No If all of the above answers are "NO", then may proceed with Cephalosporin use.   Ceclor [Cefaclor] Rash   Moxifloxacin Rash   Sulfonamide Derivatives Nausea And Vomiting    Immunization History  Administered Date(s) Administered   Fluad Quad(high Dose 65+) 10/17/2021   Influenza, High Dose Seasonal PF 11/27/2018, 01/29/2019   Influenza, Seasonal, Injecte, Preservative Fre 10/25/2017, 11/27/2018   Influenza,inj,Quad PF,6+ Mos 10/12/2016, 10/25/2017, 11/27/2018   Influenza,inj,Quad PF,6-35 Mos 10/18/2020   Influenza-Unspecified 11/24/2019   Moderna Sars-Covid-2 Vaccination 04/17/2019, 05/15/2019, 11/27/2019, 07/22/2020   PNEUMOCOCCAL CONJUGATE-20 11/06/2020   Pfizer Covid-19 Vaccine Bivalent Booster 67yrs & up 10/23/2020, 10/30/2021   Pneumococcal Polysaccharide-23 11/25/2005   Pneumococcal-Unspecified 11/25/2005   Rabies, IM 03/13/2016, 03/16/2016, 03/20/2016, 03/27/2016   Rsv, Bivalent, Protein Subunit Rsvpref,pf Verdis Frederickson) 01/06/2022   Tdap 03/13/2016   Zoster Recombinat (Shingrix) 10/19/2019, 12/24/2019, 03/14/2020    Past Medical History:  Diagnosis Date   Allergy    pollen and environmental   Anxiety    Arthritis    Asthma    Bipolar 1 disorder (HCC)    Bipolar 1 disorder (HCC)    Breast cancer (HCC)    Cataract    COPD (chronic obstructive pulmonary disease) (HCC)    Depression    Dysrhythmia    palpitations   Dysrhythmia    atrial fibrillation   Fibromyalgia    Finger fracture 2011   GERD (gastroesophageal reflux disease)     Heart murmur    History of stress test 04/28/2011   showed normal perfusion without scar or ischemia   Hx of echocardiogram 04/28/2011   showed normal systolic function with mild diastolic dysfunction,she had trace MR but did not have frank mitral valve prolapse demonstrated. She had mild pulmonary hypertension with an estimated RV systolic pressure at 34 mm.   Hyperlipemia    Meniere disease    MVP (mitral valve prolapse)    Neuromuscular disorder (HCC)    fibromyalgia    Tobacco History: Social History   Tobacco Use  Smoking Status Every Day   Packs/day: 2.00   Years: 47.00   Additional pack years: 0.00   Total pack years: 94.00   Types: Cigarettes   Start date: 1976  Smokeless Tobacco Never  Tobacco Comments   Smoke less than a pack per day.BT CMA    Ready to quit: Not Answered Counseling given: Not Answered Tobacco comments: Smoke less than a pack per day.BT CMA    Outpatient Medications Prior to Visit  Medication Sig Dispense Refill   albuterol (VENTOLIN HFA) 108 (90 Base) MCG/ACT inhaler Inhale 2 puffs into the lungs every 4 (four) hours as needed for wheezing or shortness of breath (cough, shortness of breath or wheezing.). 8 g 1   Alcohol Swabs (  B-D SINGLE USE SWABS REGULAR) PADS      ARIPiprazole (ABILIFY) 5 MG tablet Take 1 tablet (5 mg total) by mouth daily. 90 tablet 1   atenolol (TENORMIN) 50 MG tablet Take 50 mg in the morning and 25 mg in the evening 135 tablet 1   Biotin 5 MG CAPS Take 5 mg by mouth daily.     calcium-vitamin D (OSCAL WITH D) 500-200 MG-UNIT tablet Take 1 tablet by mouth.     cetirizine (ZYRTEC) 10 MG tablet Take 10 mg by mouth daily.     diclofenac sodium (VOLTAREN) 1 % GEL daily as needed.     Evolocumab (REPATHA SURECLICK) 140 MG/ML SOAJ INJECT 140 MG into THE SKIN EVERY 14 DAYS 6 mL 3   fluticasone (FLONASE) 50 MCG/ACT nasal spray USE 1 SPRAY IN EACH NOSTRIL TWICE DAILY 48 g 1   Fluticasone-Salmeterol 113-14 MCG/ACT AEPB Inhale 1  PUFF into the lungs 2 TIMES DAILY 1 each 11   guaiFENesin (GNP MUCUS ER) 600 MG 12 hr tablet TAKE 1 TABLET BY MOUTH 2 TIMES DAILY AS NEEDED 60 tablet 3   HYDROcodone-acetaminophen (NORCO/VICODIN) 5-325 MG tablet take 1 TABLET BY MOUTH EVERY 6 HOURS AS NEEDED for moderate pain 120 tablet 0   L-Methylfolate 15 MG TABS Take 1 tablet (15 mg total) by mouth daily. 90 tablet 1   lamoTRIgine (LAMICTAL) 200 MG tablet TAKE 1/2 TABLET EVERY MORNING  AND TAKE 2 TABLETS AT BEDTIME 225 tablet 1   LUTEIN PO Take by mouth daily.     Magnesium 500 MG TABS Take 250 mg by mouth daily.     meclizine (ANTIVERT) 25 MG tablet Take 1 tablet (25 mg total) by mouth as needed for dizziness. 30 tablet 1   meloxicam (MOBIC) 15 MG tablet TAKE 1 TABLET BY MOUTH ONCE A DAY AS NEEDED INFLAMMATION     methocarbamol (ROBAXIN) 500 MG tablet TAKE 1 TABLET BY MOUTH EVERY 6 TO 8 HOURS AS NEEDED FOR SPASMS/MUSCLE TENSION     montelukast (SINGULAIR) 10 MG tablet Take 1 tablet (10 mg total) by mouth at bedtime. 90 tablet 3   pantoprazole (PROTONIX) 40 MG tablet Take 1 tablet (40 mg total) by mouth daily. 90 tablet 1   pramipexole (MIRAPEX) 0.125 MG tablet TAKE 1 TABLET 2 TO 3 HOURS BEFORE BEDTIME, THEN MAY INCREASE AS DIRECTED TO 2 TABLETS AT BEDTIME AS TOLERATED 180 tablet 3   predniSONE (DELTASONE) 10 MG tablet Take 2 tablets daily x 5 days 10 tablet 0   traZODone (DESYREL) 100 MG tablet Take 2 tablets (200 mg total) by mouth at bedtime. 180 tablet 0   triamcinolone cream (KENALOG) 0.1 % Apply 1 application topically 2 (two) times daily. 30 g 0   triamterene-hydrochlorothiazide (MAXZIDE-25) 37.5-25 MG tablet Take 0.5 tablets daily by mouth. 90 tablet 3   Varenicline Tartrate, Starter, (CHANTIX STARTING MONTH PAK) 0.5 MG X 11 & 1 MG X 42 TBPK Per pack instructions 53 each 0   venlafaxine XR (EFFEXOR-XR) 150 MG 24 hr capsule TAKE 2 CAPSULES EVERY DAY WITH BREAKFAST 180 capsule 1   zolpidem (AMBIEN) 5 MG tablet Take 1 tablet (5 mg total)  by mouth at bedtime as needed for sleep. 30 tablet 1   No facility-administered medications prior to visit.      Review of Systems  Review of Systems  Constitutional: Negative.   HENT: Negative.    Respiratory: Negative.  Negative for cough, shortness of breath and wheezing.  Physical Exam  BP 112/74 (BP Location: Left Arm, Patient Position: Sitting, Cuff Size: Normal)   Pulse (!) 59   Temp (!) 97.3 F (36.3 C) (Temporal)   Ht 5' 2.5" (1.588 m)   Wt 133 lb 3.2 oz (60.4 kg)   LMP 05/30/2011   SpO2 97%   BMI 23.97 kg/m  Physical Exam Constitutional:      Appearance: Normal appearance.  HENT:     Head: Normocephalic and atraumatic.     Mouth/Throat:     Mouth: Mucous membranes are moist.     Pharynx: Oropharynx is clear.  Cardiovascular:     Rate and Rhythm: Normal rate and regular rhythm.  Pulmonary:     Effort: Pulmonary effort is normal.     Breath sounds: Normal breath sounds.  Skin:    General: Skin is warm and dry.  Neurological:     General: No focal deficit present.     Mental Status: She is alert and oriented to person, place, and time. Mental status is at baseline.  Psychiatric:        Mood and Affect: Mood normal.        Behavior: Behavior normal.        Thought Content: Thought content normal.        Judgment: Judgment normal.      Lab Results:  CBC    Component Value Date/Time   WBC 6.4 08/12/2016 1659   WBC 10.6 (H) 03/13/2016 1720   RBC 4.53 08/12/2016 1659   RBC 4.87 03/13/2016 1720   HGB 14.2 08/12/2016 1659   HGB 13.7 08/20/2014 1117   HCT 41.9 08/12/2016 1659   HCT 40.3 08/20/2014 1117   PLT 230 08/12/2016 1659   MCV 93 08/12/2016 1659   MCV 94.1 08/20/2014 1117   MCH 31.3 08/12/2016 1659   MCH 30.6 03/13/2016 1720   MCHC 33.9 08/12/2016 1659   MCHC 34.3 03/13/2016 1720   RDW 14.1 08/12/2016 1659   RDW 13.2 08/20/2014 1117   LYMPHSABS 2.4 03/13/2016 1720   LYMPHSABS 2.0 08/20/2014 1117   MONOABS 0.9 03/13/2016 1720    MONOABS 0.5 08/20/2014 1117   EOSABS 0.0 03/13/2016 1720   EOSABS 0.1 08/20/2014 1117   BASOSABS 0.0 03/13/2016 1720   BASOSABS 0.1 08/20/2014 1117    BMET    Component Value Date/Time   NA 140 11/25/2017 1155   NA 138 08/20/2014 1117   K 4.3 11/25/2017 1155   K 4.7 08/20/2014 1117   CL 98 11/25/2017 1155   CL 104 06/29/2012 1221   CO2 27 11/25/2017 1155   CO2 26 08/20/2014 1117   GLUCOSE 84 11/25/2017 1155   GLUCOSE 82 03/13/2016 1720   GLUCOSE 92 08/20/2014 1117   GLUCOSE 107 (H) 06/29/2012 1221   BUN 17 11/25/2017 1155   BUN 16.0 08/20/2014 1117   CREATININE 0.93 11/25/2017 1155   CREATININE 0.9 08/20/2014 1117   CALCIUM 9.8 11/25/2017 1155   CALCIUM 9.4 08/20/2014 1117   GFRNONAA 68 11/25/2017 1155   GFRAA 78 11/25/2017 1155    BNP No results found for: "BNP"  ProBNP No results found for: "PROBNP"  Imaging: No results found.   Assessment & Plan:   Chronic obstructive pulmonary disease (HCC) - Current smoker. Treated for mild exacerbation in April with prednisone. Respiratory symptoms are currently stable on fluticasone-salmeterol 113-73mcg one puff twice daily. Rare SABA use. No changes today. Smoking cessation encouraged.   Cigarette smoker - Patient is motivated to quit smoking, she  has cut back from 3ppd to <1ppd. Currently on chantix and doing well. Target quit date id mid-July. Following with lung cancer screening program due for low-dose CT in August 2024.   Insomnia - Difficulty sleeping while on chantix, taking trazodone and prn Ambien 5mg  with improvement. NO residual daytime grogginess.    Glenford Bayley, NP 07/13/2022

## 2022-07-13 NOTE — Assessment & Plan Note (Addendum)
-   Patient is motivated to quit smoking, she has cut back from 3ppd to <1ppd. Currently on chantix and doing well. Target quit date id mid-July. Following with lung cancer screening program due for low-dose CT in August 2024.

## 2022-07-13 NOTE — Assessment & Plan Note (Addendum)
-   Difficulty sleeping while on chantix, taking trazodone and prn Ambien 5mg  with improvement. NO residual daytime grogginess.

## 2022-07-13 NOTE — Patient Instructions (Signed)
Congratulations on cutting back on your smoking, your target quit date is mid July  Recommendations: Continue fluticasone-salmeterol 1 puff twice daily  Use albuterol 2 puffs every 4-6 hours as needed for breakthrough shortness of breath or wheezing Continue Singulair 10 mg at bedtime Continue Ambien as prescribed for insomnia while on chantix   Follow-up: 8 weeks with Beth NP or sooner   Steps to Quit Smoking Smoking tobacco is the leading cause of preventable death. It can affect almost every organ in the body. Smoking puts you and people around you at risk for many serious, long-lasting (chronic) diseases. Quitting smoking can be hard, but it is one of the best things that you can do for your health. It is never too late to quit. Do not give up if you cannot quit the first time. Some people need to try many times to quit. Do your best to stick to your quit plan, and talk with your doctor if you have any questions or concerns. How do I get ready to quit? Pick a date to quit. Set a date within the next 2 weeks to give you time to prepare. Write down the reasons why you are quitting. Keep this list in places where you will see it often. Tell your family, friends, and co-workers that you are quitting. Their support is important. Talk with your doctor about the choices that may help you quit. Find out if your health insurance will pay for these treatments. Know the people, places, things, and activities that make you want to smoke (triggers). Avoid them. What first steps can I take to quit smoking? Throw away all cigarettes at home, at work, and in your car. Throw away the things that you use when you smoke, such as ashtrays and lighters. Clean your car. Empty the ashtray. Clean your home, including curtains and carpets. What can I do to help me quit smoking? Talk with your doctor about taking medicines and seeing a counselor. You are more likely to succeed when you do both. If you are  pregnant or breastfeeding: Talk with your doctor about counseling or other ways to quit smoking. Do not take medicine to help you quit smoking unless your doctor tells you to. Quit right away Quit smoking completely, instead of slowly cutting back on how much you smoke over a period of time. Stopping smoking right away may be more successful than slowly quitting. Go to counseling. In-person is best if this is an option. You are more likely to quit if you go to counseling sessions regularly. Take medicine You may take medicines to help you quit. Some medicines need a prescription, and some you can buy over-the-counter. Some medicines may contain a drug called nicotine to replace the nicotine in cigarettes. Medicines may: Help you stop having the desire to smoke (cravings). Help to stop the problems that come when you stop smoking (withdrawal symptoms). Your doctor may ask you to use: Nicotine patches, gum, or lozenges. Nicotine inhalers or sprays. Non-nicotine medicine that you take by mouth. Find resources Find resources and other ways to help you quit smoking and remain smoke-free after you quit. They include: Online chats with a Veterinary surgeon. Phone quitlines. Printed Materials engineer. Support groups or group counseling. Text messaging programs. Mobile phone apps. Use apps on your mobile phone or tablet that can help you stick to your quit plan. Examples of free services include Quit Guide from the CDC and smokefree.gov  What can I do to make it easier to  quit?  Talk to your family and friends. Ask them to support and encourage you. Call a phone quitline, such as 1-800-QUIT-NOW, reach out to support groups, or work with a Veterinary surgeon. Ask people who smoke to not smoke around you. Avoid places that make you want to smoke, such as: Bars. Parties. Smoke-break areas at work. Spend time with people who do not smoke. Lower the stress in your life. Stress can make you want to smoke. Try  these things to lower stress: Getting regular exercise. Doing deep-breathing exercises. Doing yoga. Meditating. What benefits will I see if I quit smoking? Over time, you may have: A better sense of smell and taste. Less coughing and sore throat. A slower heart rate. Lower blood pressure. Clearer skin. Better breathing. Fewer sick days. Summary Quitting smoking can be hard, but it is one of the best things that you can do for your health. Do not give up if you cannot quit the first time. Some people need to try many times to quit. When you decide to quit smoking, make a plan to help you succeed. Quit smoking right away, not slowly over a period of time. When you start quitting, get help and support to keep you smoke-free. This information is not intended to replace advice given to you by your health care provider. Make sure you discuss any questions you have with your health care provider. Document Revised: 01/02/2021 Document Reviewed: 01/02/2021 Elsevier Patient Education  2024 [employer].

## 2022-08-03 ENCOUNTER — Ambulatory Visit (INDEPENDENT_AMBULATORY_CARE_PROVIDER_SITE_OTHER): Payer: Medicare HMO | Admitting: Psychiatry

## 2022-08-03 ENCOUNTER — Encounter: Payer: Self-pay | Admitting: Psychiatry

## 2022-08-03 VITALS — Wt 134.0 lb

## 2022-08-03 DIAGNOSIS — G2581 Restless legs syndrome: Secondary | ICD-10-CM

## 2022-08-03 DIAGNOSIS — F5101 Primary insomnia: Secondary | ICD-10-CM

## 2022-08-03 DIAGNOSIS — F3162 Bipolar disorder, current episode mixed, moderate: Secondary | ICD-10-CM | POA: Diagnosis not present

## 2022-08-03 MED ORDER — VENLAFAXINE HCL ER 150 MG PO CP24: ORAL_CAPSULE | ORAL | 1 refills | Status: DC

## 2022-08-03 MED ORDER — ARIPIPRAZOLE 5 MG PO TABS: 5.0000 mg | ORAL_TABLET | Freq: Every day | ORAL | 1 refills | Status: DC

## 2022-08-03 MED ORDER — TRAZODONE HCL 100 MG PO TABS
200.0000 mg | ORAL_TABLET | Freq: Every day | ORAL | 1 refills | Status: AC
Start: 2022-08-03 — End: ?

## 2022-08-03 MED ORDER — LAMOTRIGINE 200 MG PO TABS
ORAL_TABLET | ORAL | 1 refills | Status: AC
Start: 2022-08-03 — End: ?

## 2022-08-03 NOTE — Progress Notes (Signed)
Anita Arroyo [id number] [birth year] 62 y.o.  Virtual Visit via Telephone Note  I connected with pt on 08/03/22 at 10:30 AM EDT by telephone and verified that I am speaking with the correct person using two identifiers.   I discussed the limitations, risks, security and privacy concerns of performing an evaluation and management service by telephone and the availability of in person appointments. I also discussed with the patient that there may be a patient responsible charge related to this service. The patient expressed understanding and agreed to proceed.   I discussed the assessment and treatment plan with the patient. The patient was provided an opportunity to ask questions and all were answered. The patient agreed with the plan and demonstrated an understanding of the instructions.   The patient was advised to call back or seek an in-person evaluation if the symptoms worsen or if the condition fails to improve as anticipated.  I provided 25 minutes of non-face-to-face time during this encounter.  The patient was located in [place].  The provider was located at [facility].   Corie Chiquito, PMHNP   Subjective:   Patient ID:  Anita Arroyo is a 62 y.o. (DOB 03/22/1960) female.  Chief Complaint:  Chief Complaint  Patient presents with   Follow-up    Depression, anxiety, and insomnia    HPI Anita Arroyo presents for follow-up of mood disturbance and insomnia.  She reports that she has, "almost quit smoking" and down to 3-4 cigarettes and was previously smoking 3 ppd. She has been taking Chantix for smoking cessation. She reports that Chantix has caused some insomnia and she has been prescribed Ambien. She reports adequate sleep with Ambien.  She reports that her mood is "not as good as usual" since starting Chantix. She notices her mood is mildly irritable and is less irritable than past trial of Chantix. Denies anxiety with Chantix. 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 Denies depressed mood. She reports feeling "ho hum" and that she is bored. Energy and motivation have been somewhat low. She reports adequate concentration. Denies SI.   She reports that she has tried stopping cigarettes a few times before. She reports that she is feeling better overall. She reports gaining about 10 lbs with smoking cessation.   She has been pet sitting for the last 17 days. She will return home tonight.   Review of Systems:  Review of Systems  Respiratory:         Improved cough and shortness of breath  Musculoskeletal:  Negative for gait problem.       She reports fractured collarbone  Neurological:  Positive for tremors.  Psychiatric/Behavioral:         Please refer to HPI    Medications: I have reviewed the patient's current medications.  Current Outpatient Medications  Medication Sig Dispense Refill   albuterol (VENTOLIN HFA) 108 (90 Base) MCG/ACT inhaler Inhale 2 puffs into the lungs every 4 (four) hours as needed for wheezing or shortness of breath (cough, shortness of breath or wheezing.). 8 g 1   atenolol (TENORMIN) 50 MG tablet Take 50 mg in the morning and 25 mg in the evening 135 tablet 1   Biotin 5 MG CAPS Take 5 mg by mouth daily.     calcium-vitamin D (OSCAL WITH D) 500-200 MG-UNIT tablet Take 1 tablet by mouth.     cetirizine (ZYRTEC) 10  MG tablet Take 10 mg by mouth daily.     diclofenac sodium (VOLTAREN) 1 % GEL daily as needed.     Evolocumab (REPATHA SURECLICK) 140 MG/ML SOAJ INJECT 140 MG into THE SKIN EVERY 14 DAYS 6 mL 3   fluticasone (FLONASE) 50 MCG/ACT nasal spray USE 1 SPRAY IN EACH NOSTRIL TWICE DAILY 48 g 1   Fluticasone-Salmeterol 113-14 MCG/ACT AEPB Inhale 1 PUFF into the lungs 2 TIMES DAILY 1 each 11   guaiFENesin (GNP MUCUS ER) 600 MG 12 hr tablet TAKE 1 TABLET BY MOUTH 2 TIMES DAILY AS NEEDED 60 tablet 3   HYDROcodone-acetaminophen  (NORCO/VICODIN) 5-325 MG tablet take 1 TABLET BY MOUTH EVERY 6 HOURS AS NEEDED for moderate pain 120 tablet 0   L-Methylfolate 15 MG TABS Take 1 tablet (15 mg total) by mouth daily. 90 tablet 1   LUTEIN PO Take by mouth daily.     Magnesium 500 MG TABS Take 250 mg by mouth daily.     montelukast (SINGULAIR) 10 MG tablet Take 1 tablet (10 mg total) by mouth at bedtime. 90 tablet 3   pantoprazole (PROTONIX) 40 MG tablet Take 1 tablet (40 mg total) by mouth daily. 90 tablet 1   pramipexole (MIRAPEX) 0.125 MG tablet TAKE 1 TABLET 2 TO 3 HOURS BEFORE BEDTIME, THEN MAY INCREASE AS DIRECTED TO 2 TABLETS AT BEDTIME AS TOLERATED 180 tablet 3   triamcinolone cream (KENALOG) 0.1 % Apply 1 application topically 2 (two) times daily. 30 g 0   triamterene-hydrochlorothiazide (MAXZIDE-25) 37.5-25 MG tablet Take 0.5 tablets daily by mouth. 90 tablet 3   Varenicline Tartrate, Starter, (CHANTIX STARTING MONTH PAK) 0.5 MG X 11 & 1 MG X 42 TBPK Per pack instructions 53 each 0   zolpidem (AMBIEN) 5 MG tablet Take 1 tablet (5 mg total) by mouth at bedtime as needed for sleep. 30 tablet 1   Alcohol Swabs (B-D SINGLE USE SWABS REGULAR) PADS      ARIPiprazole (ABILIFY) 5 MG tablet Take 1 tablet (5 mg total) by mouth daily. 90 tablet 1   lamoTRIgine (LAMICTAL) 200 MG tablet TAKE 1/2 TABLET EVERY MORNING  AND TAKE 2 TABLETS AT BEDTIME 225 tablet 1   meclizine (ANTIVERT) 25 MG tablet Take 1 tablet (25 mg total) by mouth as needed for dizziness. (Patient not taking: Reported on 08/03/2022) 30 tablet 1   meloxicam (MOBIC) 15 MG tablet TAKE 1 TABLET BY MOUTH ONCE A DAY AS NEEDED INFLAMMATION (Patient not taking: Reported on 08/03/2022)     methocarbamol (ROBAXIN) 500 MG tablet TAKE 1 TABLET BY MOUTH EVERY 6 TO 8 HOURS AS NEEDED FOR SPASMS/MUSCLE TENSION (Patient not taking: Reported on 08/03/2022)     traZODone (DESYREL) 100 MG tablet Take 2 tablets (200 mg total) by mouth at bedtime. 180 tablet 1   venlafaxine XR (EFFEXOR-XR) 150 MG  24 hr capsule TAKE 2 CAPSULES EVERY DAY WITH BREAKFAST 180 capsule 1   No current facility-administered medications for this visit.    Medication Side Effects: Other: She reports occasional invountary movements in her hands and feet- "not enough to bother me."   Allergies:  Allergies  Allergen Reactions   Contrast Media [Iodinated Contrast Media] Anaphylaxis    Anaphylactic shock.   Iodine Shortness Of Breath   Celecoxib     "I climb the walls"  Other Reaction(s): Other (See Comments)  "I climb the walls", "I climb the walls"   Ezetimibe Other (See Comments)    "I can't take any cholesterol  medicines."  Other Reaction(s): Other (See Comments)   Statins Other (See Comments)    Body aches, constipation AND DEPRESSION  Body aches, constipation  Other Reaction(s): Other (See Comments)  Body aches, constipation, Body aches, constipation AND DEPRESSION   Sulfa Antibiotics Nausea And Vomiting    Other Reaction(s): GI Intolerance   Adhesive [Tape] Other (See Comments)    Reaction:  Blisters    Augmentin [Amoxicillin-Pot Clavulanate] Rash and Other (See Comments)    Has patient had a PCN reaction causing immediate rash, facial/tongue/throat swelling, SOB or lightheadedness with hypotension: No Has patient had a PCN reaction causing severe rash involving mucus membranes or skin necrosis: No Has patient had a PCN reaction that required hospitalization No Has patient had a PCN reaction occurring within the last 10 years: No If all of the above answers are "NO", then may proceed with Cephalosporin use.   Ceclor [Cefaclor] Rash   Moxifloxacin Rash   Sulfonamide Derivatives Nausea And Vomiting    Past Medical History:  Diagnosis Date   Allergy    pollen and environmental   Anxiety    Arthritis    Asthma    Bipolar 1 disorder (HCC)    Bipolar 1 disorder (HCC)    Breast cancer (HCC)    Cataract    COPD (chronic obstructive pulmonary disease) (HCC)    Depression     Dysrhythmia    palpitations   Dysrhythmia    atrial fibrillation   Fibromyalgia    Finger fracture 2011   GERD (gastroesophageal reflux disease)    Heart murmur    History of stress test 04/28/2011   showed normal perfusion without scar or ischemia   Hx of echocardiogram 04/28/2011   showed normal systolic function with mild diastolic dysfunction,she had trace MR but did not have frank mitral valve prolapse demonstrated. She had mild pulmonary hypertension with an estimated RV systolic pressure at 34 mm.   Hyperlipemia    Meniere disease    MVP (mitral valve prolapse)    Neuromuscular disorder (HCC)    fibromyalgia    Family History  Problem Relation Age of Onset   Breast cancer Mother        biological mother   Mitral valve prolapse Mother    Heart Problems Mother    Cancer Mother    Colon cancer Maternal Grandfather    Esophageal cancer Neg Hx    Rectal cancer Neg Hx    Stomach cancer Neg Hx     Social History   Socioeconomic History   Marital status: Single    Spouse name: Not on file   Number of children: 0   Years of education: Not on file   Highest education level: Not on file  Occupational History   Not on file  Tobacco Use   Smoking status: Every Day    Packs/day: 2.00    Years: 47.00    Additional pack years: 0.00    Total pack years: 94.00    Types: Cigarettes    Start date: 1976   Smokeless tobacco: Never   Tobacco comments:    Smoke less than a pack per day.BT CMA   Vaping Use   Vaping Use: Some days   Start date: 06/23/2020   Substances: Nicotine  Substance and Sexual Activity   Alcohol use: Not Currently    Comment: less than monthly   Drug use: No   Sexual activity: Not Currently  Other Topics Concern   Not on file  Social  History Narrative   Not on file   Social Determinants of Health   Financial Resource Strain: Not on file  Food Insecurity: Not on file  Transportation Needs: Not on file  Physical Activity: Not on file  Stress:  Not on file  Social Connections: Not on file  Intimate Partner Violence: Not on file    Past Medical History, Surgical history, Social history, and Family history were reviewed and updated as appropriate.   Please see review of systems for further details on the patient's review from today.   Objective:   Physical Exam:  Wt 134 lb (60.8 kg)   LMP 05/30/2011   BMI 24.12 kg/m   Physical Exam Neurological:     Mental Status: She is alert and oriented to person, place, and time.     Cranial Nerves: No dysarthria.  Psychiatric:        Attention and Perception: Attention and perception normal.        Mood and Affect: Mood is not anxious.        Speech: Speech normal.        Behavior: Behavior is cooperative.        Thought Content: Thought content normal. Thought content is not paranoid or delusional. Thought content does not include homicidal or suicidal ideation. Thought content does not include homicidal or suicidal plan.        Cognition and Memory: Cognition and memory normal.        Judgment: Judgment normal.     Comments: Insight intact Mood is mildly depressed     Lab Review:     Component Value Date/Time   NA 140 11/25/2017 1155   NA 138 08/20/2014 1117   K 4.3 11/25/2017 1155   K 4.7 08/20/2014 1117   CL 98 11/25/2017 1155   CL 104 06/29/2012 1221   CO2 27 11/25/2017 1155   CO2 26 08/20/2014 1117   GLUCOSE 84 11/25/2017 1155   GLUCOSE 82 03/13/2016 1720   GLUCOSE 92 08/20/2014 1117   GLUCOSE 107 (H) 06/29/2012 1221   BUN 17 11/25/2017 1155   BUN 16.0 08/20/2014 1117   CREATININE 0.93 11/25/2017 1155   CREATININE 0.9 08/20/2014 1117   CALCIUM 9.8 11/25/2017 1155   CALCIUM 9.4 08/20/2014 1117   PROT 6.7 11/25/2017 1155   PROT 6.6 08/20/2014 1117   ALBUMIN 4.7 11/25/2017 1155   ALBUMIN 3.9 08/20/2014 1117   AST 21 11/25/2017 1155   AST 22 08/20/2014 1117   ALT 21 11/25/2017 1155   ALT 18 08/20/2014 1117   ALKPHOS 109 11/25/2017 1155   ALKPHOS 54  08/20/2014 1117   BILITOT <0.2 11/25/2017 1155   BILITOT 0.47 08/20/2014 1117   GFRNONAA 68 11/25/2017 1155   GFRAA 78 11/25/2017 1155       Component Value Date/Time   WBC 6.4 08/12/2016 1659   WBC 10.6 (H) 03/13/2016 1720   RBC 4.53 08/12/2016 1659   RBC 4.87 03/13/2016 1720   HGB 14.2 08/12/2016 1659   HGB 13.7 08/20/2014 1117   HCT 41.9 08/12/2016 1659   HCT 40.3 08/20/2014 1117   PLT 230 08/12/2016 1659   MCV 93 08/12/2016 1659   MCV 94.1 08/20/2014 1117   MCH 31.3 08/12/2016 1659   MCH 30.6 03/13/2016 1720   MCHC 33.9 08/12/2016 1659   MCHC 34.3 03/13/2016 1720   RDW 14.1 08/12/2016 1659   RDW 13.2 08/20/2014 1117   LYMPHSABS 2.4 03/13/2016 1720   LYMPHSABS 2.0 08/20/2014 1117  MONOABS 0.9 03/13/2016 1720   MONOABS 0.5 08/20/2014 1117   EOSABS 0.0 03/13/2016 1720   EOSABS 0.1 08/20/2014 1117   BASOSABS 0.0 03/13/2016 1720   BASOSABS 0.1 08/20/2014 1117    No results found for: "POCLITH", "LITHIUM"   No results found for: "PHENYTOIN", "PHENOBARB", "VALPROATE", "CBMZ"   .res Assessment: Plan:    Pt reports that her mood has been slightly lower since starting Chantix for smoking cessation. She reports that overall mood has been manageable and she would like to continue Chantix since she has experienced some improvement with nicotine cravings.  Will continue current plan of care and advised pt to contact office if mood symptoms worsen.  Will continue Abilify 5 mg daily for mood.  Continue Lamictal 100 mg in the morning and 400 mg at bedtime for mood symptoms.  Continue Effexor XR 300 mg daily for mood symptoms.  Continue Trazodone 200 mg at bedtime for insomnia.  Pt to follow-up in 6 months or sooner if clinically indicated.    Xi was seen today for follow-up.  Diagnoses and all orders for this visit:  Bipolar 1 disorder, mixed, moderate (HCC) -     ARIPiprazole (ABILIFY) 5 MG tablet; Take 1 tablet (5 mg total) by mouth daily. -     lamoTRIgine  (LAMICTAL) 200 MG tablet; TAKE 1/2 TABLET EVERY MORNING  AND TAKE 2 TABLETS AT BEDTIME -     venlafaxine XR (EFFEXOR-XR) 150 MG 24 hr capsule; TAKE 2 CAPSULES EVERY DAY WITH BREAKFAST  Primary insomnia -     traZODone (DESYREL) 100 MG tablet; Take 2 tablets (200 mg total) by mouth at bedtime.  RLS (restless legs syndrome)    Please see After Visit Summary for patient specific instructions.  Future Appointments  Date Time Provider Department Center  09/07/2022 10:00 AM Glenford Bayley, NP [facility] None    No orders of the defined types were placed in this encounter.     -------------------------------

## 2022-08-23 ENCOUNTER — Other Ambulatory Visit: Payer: Self-pay

## 2022-08-23 DIAGNOSIS — F3162 Bipolar disorder, current episode mixed, moderate: Secondary | ICD-10-CM

## 2022-08-23 MED ORDER — L-METHYLFOLATE 15 MG PO TABS
1.0000 | ORAL_TABLET | Freq: Every day | ORAL | 1 refills | Status: AC
Start: 2022-08-23 — End: ?

## 2022-09-07 ENCOUNTER — Ambulatory Visit: Payer: Medicare HMO | Admitting: Primary Care

## 2022-09-15 ENCOUNTER — Other Ambulatory Visit: Payer: Self-pay | Admitting: Orthopaedic Surgery

## 2022-09-15 DIAGNOSIS — M25511 Pain in right shoulder: Secondary | ICD-10-CM

## 2022-09-20 ENCOUNTER — Ambulatory Visit
Admission: RE | Admit: 2022-09-20 | Discharge: 2022-09-20 | Disposition: A | Discharge status: Home / Self Care | Payer: Medicare HMO | Source: Ambulatory Visit | Attending: Acute Care | Admitting: Acute Care

## 2022-09-20 DIAGNOSIS — Z87891 Personal history of nicotine dependence: Secondary | ICD-10-CM

## 2022-09-20 DIAGNOSIS — F17210 Nicotine dependence, cigarettes, uncomplicated: Secondary | ICD-10-CM

## 2022-09-20 DIAGNOSIS — Z122 Encounter for screening for malignant neoplasm of respiratory organs: Secondary | ICD-10-CM

## 2022-09-22 ENCOUNTER — Other Ambulatory Visit: Payer: Medicare HMO

## 2022-09-28 ENCOUNTER — Other Ambulatory Visit: Payer: Self-pay | Admitting: Acute Care

## 2022-09-28 DIAGNOSIS — Z122 Encounter for screening for malignant neoplasm of respiratory organs: Secondary | ICD-10-CM

## 2022-09-28 DIAGNOSIS — F17210 Nicotine dependence, cigarettes, uncomplicated: Secondary | ICD-10-CM

## 2022-09-28 DIAGNOSIS — Z87891 Personal history of nicotine dependence: Secondary | ICD-10-CM

## 2022-10-13 ENCOUNTER — Ambulatory Visit: Payer: Medicare HMO | Attending: General Practice | Admitting: General Practice

## 2022-10-13 ENCOUNTER — Encounter: Payer: Self-pay | Admitting: General Practice

## 2022-10-13 ENCOUNTER — Telehealth: Payer: Self-pay | Admitting: *Deleted

## 2022-10-13 VITALS — Ht 62.5 in | Wt 150.0 lb

## 2022-10-13 DIAGNOSIS — Z01810 Encounter for preprocedural cardiovascular examination: Secondary | ICD-10-CM | POA: Diagnosis not present

## 2022-10-13 NOTE — Progress Notes (Signed)
Virtual Visit via Telephone Note   Because of Anita Arroyo's co-morbid illnesses, she is at least at moderate risk for complications without adequate follow up.  This format is felt to be most appropriate for this patient at this time.  The patient did not have access to video technology/had technical difficulties with video requiring transitioning to audio format only (telephone).  All issues noted in this document were discussed and addressed.  No physical exam could be performed with this format.  Please refer to the patient's chart for her consent to telehealth for [facility].  Evaluation Performed:  Preoperative cardiovascular risk assessment _____________   Date:  10/13/2022   Patient ID:  Anita Arroyo, DOB Jun 27, 1960, MRN [id number] Patient Location:  Home Provider location:   Office  Primary Care Provider:  Porfirio Oar, PA Primary Cardiologist:  Nicki Guadalajara, MD  Chief Complaint / Patient Profile   62 y.o. y/o female with a h/o essential hypertension, aortic atherosclerosis, bipolar disorder, atrial fibrillation who is pending right reverse total shoulder arthroplasty and presents today for telephonic preoperative cardiovascular risk assessment.  History of Present Illness    Anita Arroyo is a 62 y.o. female who presents via audio/video conferencing for a telehealth visit today.  Pt was last seen in cardiology clinic on 05/07/2022 by Edd Fabian, NP-C.  At that time Anita Arroyo was doing well .  The patient is now pending procedure as outlined above. Since her last visit, she stable from a cardiac standpoint.  Today she denies chest pain, shortness of breath, lower extremity edema, fatigue, palpitations, melena, hematuria, hemoptysis, diaphoresis, weakness, presyncope, syncope, orthopnea, and PND.   Past Medical History    Past Medical History:  Diagnosis Date   Allergy    pollen and environmental   Anxiety    Arthritis    Asthma    Bipolar 1 disorder  (HCC)    Bipolar 1 disorder (HCC)    Breast cancer (HCC)    Cataract    COPD (chronic obstructive pulmonary disease) (HCC)    Depression    Dysrhythmia    palpitations   Dysrhythmia    atrial fibrillation   Fibromyalgia    Finger fracture 2011   GERD (gastroesophageal reflux disease)    Heart murmur    History of stress test 04/28/2011   showed normal perfusion without scar or ischemia   Hx of echocardiogram 04/28/2011   showed normal systolic function with mild diastolic dysfunction,she had trace MR but did not have frank mitral valve prolapse demonstrated. She had mild pulmonary hypertension with an estimated RV systolic pressure at 34 mm.   Hyperlipemia    Meniere disease    MVP (mitral valve prolapse)    Neuromuscular disorder [facility])    fibromyalgia   Past Surgical History:  Procedure Laterality Date   ANKLE SURGERY     left x4   BREAST LUMPECTOMY Right 2011   BREAST SURGERY  2011   lumpectomy right breast    COLONOSCOPY     COLONOSCOPY WITH PROPOFOL N/A 02/25/2014   Procedure: COLONOSCOPY WITH PROPOFOL;  Surgeon: Charolett Bumpers, MD;  Location: [facility];  Service: Endoscopy;  Laterality: N/A;   deviated septum     ECTOPIC PREGNANCY SURGERY     ELBOW SURGERY     right elbow   EYE SURGERY Bilateral    cataract surgery   fibroid     2-3 fibroid adenomas removed   HAND SURGERY Left 2020  fall from Dog, pinkie finger, ring finger, middle finger   JOINT REPLACEMENT  2011   right knee    KNEE SURGERY     left knee   POLYPECTOMY     SHOULDER ARTHROSCOPY WITH BICEPSTENOTOMY Right 09/25/2015   Procedure: SHOULDER ARTHROSCOPY WITH BICEPSTENOTOMY;  Surgeon: Loreta Ave, MD;  Location: [facility];  Service: Orthopedics;  Laterality: Right;   SHOULDER ARTHROSCOPY WITH DISTAL CLAVICLE RESECTION Right 09/25/2015   Procedure: SHOULDER ARTHROSCOPY WITH DISTAL CLAVICLE RESECTION;  Surgeon: Loreta Ave, MD;  Location: [facility];   Service: Orthopedics;  Laterality: Right;   SHOULDER ARTHROSCOPY WITH SUBACROMIAL DECOMPRESSION Right 09/25/2015   Procedure: RIGHT SHOULDER ARTHROSCOPY DEBRIDEMENT,ACROMIOPLASTY,DISTAL CLAVICAL EXCISION, RELEASE OF BICEPS TENDON;  Surgeon: Loreta Ave, MD;  Location: [facility];  Service: Orthopedics;  Laterality: Right;   TMJ ARTHROPLASTY     TONSILLECTOMY     TOTAL SHOULDER ARTHROPLASTY Left 01/05/2016   UTERINE FIBROID SURGERY     polups and fibroids removed    WRIST SURGERY     left    Allergies  Allergies  Allergen Reactions   Contrast Media [Iodinated Contrast Media] Anaphylaxis    Anaphylactic shock.   Iodine Shortness Of Breath   Celecoxib     "I climb the walls"  Other Reaction(s): Other (See Comments)  "I climb the walls", "I climb the walls"   Ezetimibe Other (See Comments)    "I can't take any cholesterol medicines."  Other Reaction(s): Other (See Comments)   Statins Other (See Comments)    Body aches, constipation AND DEPRESSION  Body aches, constipation  Other Reaction(s): Other (See Comments)  Body aches, constipation, Body aches, constipation AND DEPRESSION   Sulfa Antibiotics Nausea And Vomiting    Other Reaction(s): GI Intolerance   Adhesive [Tape] Other (See Comments)    Reaction:  Blisters    Augmentin [Amoxicillin-Pot Clavulanate] Rash and Other (See Comments)    Has patient had a PCN reaction causing immediate rash, facial/tongue/throat swelling, SOB or lightheadedness with hypotension: No Has patient had a PCN reaction causing severe rash involving mucus membranes or skin necrosis: No Has patient had a PCN reaction that required hospitalization No Has patient had a PCN reaction occurring within the last 10 years: No If all of the above answers are "NO", then may proceed with Cephalosporin use.   Ceclor [Cefaclor] Rash   Moxifloxacin Rash   Sulfonamide Derivatives Nausea And Vomiting    Home Medications    Prior to  Admission medications   Medication Sig Start Date End Date Taking? Authorizing Provider  albuterol (VENTOLIN HFA) 108 (90 Base) MCG/ACT inhaler Inhale 2 puffs into the lungs every 4 (four) hours as needed for wheezing or shortness of breath (cough, shortness of breath or wheezing.). 05/25/22  Yes Glenford Bayley, NP  Alcohol Swabs (B-D SINGLE USE SWABS REGULAR) PADS  12/31/16  Yes [provider]  ARIPiprazole (ABILIFY) 5 MG tablet Take 1 tablet (5 mg total) by mouth daily. 08/03/22  Yes Corie Chiquito, PMHNP  aspirin EC 81 MG tablet Take 81 mg by mouth daily. Swallow whole.   Yes [provider]  atenolol (TENORMIN) 50 MG tablet Take 50 mg in the morning and 25 mg in the evening 04/27/17  Yes Lennette Bihari, MD  Biotin 5 MG CAPS Take 5 mg by mouth daily.   Yes [provider]  calcium-vitamin D (OSCAL WITH D) 500-200 MG-UNIT tablet Take 1 tablet by mouth.  Yes [provider]  cetirizine (ZYRTEC) 10 MG tablet Take 10 mg by mouth daily.   Yes [provider]  diclofenac sodium (VOLTAREN) 1 % GEL daily as needed. 01/26/17  Yes [provider]  Evolocumab (REPATHA SURECLICK) 140 MG/ML SOAJ INJECT 140 MG into THE SKIN EVERY 14 DAYS 02/01/22  Yes Lennette Bihari, MD  fluticasone [facility]) 50 MCG/ACT nasal spray USE 1 SPRAY IN EACH NOSTRIL TWICE DAILY 11/16/16  Yes Leotis Shames, [place], PA  Fluticasone-Salmeterol 113-14 MCG/ACT AEPB Inhale 1 PUFF into the lungs 2 TIMES DAILY 10/14/21  Yes Glenford Bayley, NP  guaiFENesin (GNP MUCUS ER) 600 MG 12 hr tablet TAKE 1 TABLET BY MOUTH 2 TIMES DAILY AS NEEDED 03/03/21  Yes Glenford Bayley, NP  HYDROcodone-acetaminophen (NORCO/VICODIN) 5-325 MG tablet take 1 TABLET BY MOUTH EVERY 6 HOURS AS NEEDED for moderate pain 07/27/17  Yes Jeffery, Chelle, PA  L-Methylfolate 15 MG TABS Take 1 tablet (15 mg total) by mouth daily. 08/23/22  Yes Corie Chiquito, PMHNP  lamoTRIgine (LAMICTAL) 200 MG tablet TAKE 1/2 TABLET EVERY  MORNING  AND TAKE 2 TABLETS AT BEDTIME 08/03/22  Yes Corie Chiquito, PMHNP  LUTEIN PO Take by mouth daily.   Yes [provider]  Magnesium 500 MG TABS Take 250 mg by mouth daily.   Yes [provider]  meclizine (ANTIVERT) 25 MG tablet Take 1 tablet (25 mg total) by mouth as needed for dizziness. 11/10/16  Yes Lennette Bihari, MD  meloxicam [facility]) 15 MG tablet  01/12/19  Yes [provider]  methocarbamol (ROBAXIN) 500 MG tablet  02/03/19  Yes [provider]  montelukast (SINGULAIR) 10 MG tablet Take 1 tablet (10 mg total) by mouth at bedtime. 05/13/21  Yes Glenford Bayley, NP  pantoprazole (PROTONIX) 40 MG tablet Take 1 tablet (40 mg total) by mouth daily. 04/26/17  Yes Jeffery, Chelle, PA  pramipexole (MIRAPEX) 0.125 MG tablet TAKE 1 TABLET 2 TO 3 HOURS BEFORE BEDTIME, THEN MAY INCREASE AS DIRECTED TO 2 TABLETS AT BEDTIME AS TOLERATED 01/27/22  Yes Corie Chiquito, PMHNP  traZODone (DESYREL) 100 MG tablet Take 2 tablets (200 mg total) by mouth at bedtime. 08/03/22  Yes Corie Chiquito, PMHNP  triamcinolone cream (KENALOG) 0.1 % Apply 1 application topically 2 (two) times daily. 04/19/18  Yes Worthy Rancher B, FNP  triamterene-hydrochlorothiazide (MAXZIDE-25) 37.5-25 MG tablet Take 0.5 tablets daily by mouth. 11/30/16  Yes Jeffery, Avelino Leeds, PA  Varenicline Tartrate, Starter, (CHANTIX STARTING MONTH PAK) 0.5 MG X 11 & 1 MG X 42 TBPK Per pack instructions 05/25/22  Yes Glenford Bayley, NP  venlafaxine XR (EFFEXOR-XR) 150 MG 24 hr capsule TAKE 2 CAPSULES EVERY DAY WITH BREAKFAST 08/03/22  Yes Corie Chiquito, PMHNP  zolpidem (AMBIEN) 5 MG tablet Take 1 tablet (5 mg total) by mouth at bedtime as needed for sleep. 05/28/22  Yes Glenford Bayley, NP    Physical Exam    Vital Signs:  [name] does not have vital signs available for review today.  Given telephonic nature of communication, physical exam is limited. AAOx3. NAD. Normal affect.  Speech and respirations are  unlabored.  Accessory Clinical Findings    None  Assessment & Plan    1.  Preoperative Cardiovascular Risk Assessment: [facility], Dr. Jones Broom, right reverse total shoulder arthroplasty      Primary Cardiologist: Nicki Guadalajara, MD  Chart reviewed as part of pre-operative protocol coverage. Given past medical history and time since  last visit, based on ACC/AHA guidelines, Anita Arroyo would be at acceptable risk for the planned procedure without further cardiovascular testing.   Her RCRI is a class II risk, 0.9% risk of major cardiac event.  She is able to complete greater than 4 METS of physical activity.  Patient was advised that if she develops new symptoms prior to surgery to contact our office to arrange a follow-up appointment.  He verbalized understanding.  I will route this recommendation to the requesting party via Epic fax function and remove from pre-op pool.       Time:   Today, I have spent 5 minutes with the patient with telehealth technology discussing medical history, symptoms, and management plan.  Prior to patient's phone evaluation I spent greater than 10 minutes reviewing their past medical history and cardiac medications.    Ronney Asters, NP  10/13/2022, 4:07 PM

## 2022-10-13 NOTE — Telephone Encounter (Signed)
Spoke with patient regarding pre-op visit added on to J. Cleaver, NP's schedule today. Pt is in agreement with plan to convert to telephone visit.   Contacted [facility]/Dr. Veda Canning office and spoke with procedure scheduler, Darel Hong. Requested that pre-op clearance form be faxed urgently for visit this afternoon. Same day fax number provided. Per Darel Hong, patient is awaiting scheduling for right reverse total shoulder arthroplasty. Sherlean Foot notified.

## 2022-10-15 ENCOUNTER — Encounter (INDEPENDENT_AMBULATORY_CARE_PROVIDER_SITE_OTHER): Payer: Self-pay | Admitting: Primary Care

## 2022-10-15 ENCOUNTER — Telehealth: Payer: Self-pay | Admitting: Cardiovascular Disease

## 2022-10-15 ENCOUNTER — Telehealth: Payer: Self-pay | Admitting: *Deleted

## 2022-10-15 NOTE — Telephone Encounter (Signed)
Keep visit on 9/23

## 2022-10-15 NOTE — Telephone Encounter (Signed)
Fax received from [facility] to perform a right reverse total shoulder arthroplasty on patient.  Patient needs surgery clearance. Surgery is TBD. Patient was seen on 06/1822 and has planned appt with Buelah Manis , NP on 10/18/22. Office protocol is a risk assessment can be sent to surgeon if patient has been seen in 60 days or less.   Sending to [facility] for risk assessment or recommendations if patient needs to be seen in office prior to surgical procedure.

## 2022-10-15 NOTE — Telephone Encounter (Signed)
Pre-operative Risk Assessment    Patient Name: Anita Arroyo  DOB: 05/28/1960 MRN: [id number]      Request for Surgical Clearance    Procedure:   Left reverse total shoulder replacement  Date of Surgery:  Clearance TBD                                 Surgeon:  Dr. Claretha Cooper Group or Practice Name:  [facility]  Phone number:  [phone]  Fax number:  [phone]   Type of Clearance Requested:   - Medical  - Pharmacy   Type of Anesthesia:   Choice   Additional requests/questions:   Caller Darel Hong) stated patient will need medical and pharmacy clearance.  Signed, Annetta Maw   10/15/2022, 10:57 AM

## 2022-10-18 ENCOUNTER — Encounter: Payer: Self-pay | Admitting: Primary Care

## 2022-10-18 ENCOUNTER — Other Ambulatory Visit: Payer: Self-pay | Admitting: Primary Care

## 2022-10-18 ENCOUNTER — Ambulatory Visit: Payer: Medicare HMO | Admitting: Primary Care

## 2022-10-18 ENCOUNTER — Other Ambulatory Visit: Payer: Medicare HMO

## 2022-10-18 DIAGNOSIS — F17210 Nicotine dependence, cigarettes, uncomplicated: Secondary | ICD-10-CM | POA: Diagnosis not present

## 2022-10-18 DIAGNOSIS — R918 Other nonspecific abnormal finding of lung field: Secondary | ICD-10-CM

## 2022-10-18 DIAGNOSIS — Z79899 Other long term (current) drug therapy: Secondary | ICD-10-CM

## 2022-10-18 DIAGNOSIS — J449 Chronic obstructive pulmonary disease, unspecified: Secondary | ICD-10-CM

## 2022-10-18 NOTE — Patient Instructions (Addendum)
You looked well today. Exam was benign Low risk for prolonged mechanical ventilation and/or post op pulmonary complication No changes today, continue medications as directed  Continue Fluticasone-salemterol twice daily; take mucinex as needed to loosen congestion  Congratulations on smoking cessation!!!   1. Short duration of surgery as much as possible and avoid paralytic if possible 2. Surgiery can be done in outpatient setting  3. DVT prophylaxis 4. Aggressive pulmonary toilet with o2, bronchodilatation, and incentive spirometry and early ambulation  Follow-up 6 months with Dr. Sherene Sires or sooner if needed

## 2022-10-18 NOTE — Telephone Encounter (Signed)
Name: Anita Arroyo  DOB: [birth year]  MRN: [id number]  Primary Cardiologist: Nicki Guadalajara, MD   Preoperative team, please contact this patient and set up a phone call appointment for further preoperative risk assessment. Please obtain consent and complete medication review. Thank you for your help.  I confirm that guidance regarding antiplatelet and oral anticoagulation therapy has been completed and, if necessary, noted below.  Ideally aspirin should be continued without interruption, however if the bleeding risk is too great, aspirin may be held for 5-7 days prior to surgery. Please resume aspirin post operatively when it is felt to be safe from a bleeding standpoint.     Carlos Levering, NP 10/18/2022, 11:55 AM [facility]

## 2022-10-18 NOTE — Telephone Encounter (Signed)
This was a duplicate. Pt was clkeared on 10/13/22 with Edd Fabian, NP

## 2022-10-22 ENCOUNTER — Telehealth: Payer: Self-pay | Admitting: Psychiatry

## 2022-10-22 DIAGNOSIS — F3162 Bipolar disorder, current episode mixed, moderate: Secondary | ICD-10-CM

## 2022-10-22 MED ORDER — LAMOTRIGINE 200 MG PO TABS
ORAL_TABLET | ORAL | 0 refills | Status: DC
Start: 2022-10-22 — End: 2022-10-26

## 2022-10-22 NOTE — Telephone Encounter (Signed)
Sent!

## 2022-10-22 NOTE — Telephone Encounter (Signed)
Pt called and said that she is waiting on mail orfer to send her lamotrigine 200 mg. Pt would like a 10 day supply [facility] [address] to get her by until she gets the mail order

## 2022-10-24 NOTE — Assessment & Plan Note (Signed)
-    Patient quit smoking 11 weeks ago.  No longer taking Chantix.  Following with lung cancer screening program.

## 2022-10-24 NOTE — Assessment & Plan Note (Signed)
-   Stable; LDCT 09/20/22 showed smild centrilobular and paraseptal emphysema; stable small scattered right lung nodules, largest measuring 5.67mm unchanged from previous.

## 2022-10-24 NOTE — Assessment & Plan Note (Signed)
-   Resolved; No longer taking Ambien

## 2022-10-24 NOTE — Assessment & Plan Note (Addendum)
-   Improved; Breathing is stable, cough has resolved. No recent exacerbations requiring antibiotics or steroids.  Rare SABA use.  Continue fluticasone salmeterol as directed.   Patient will be consider low risk for prolonged mechanical ventilation and/or post op pulmonary complication. She is optimized from a pulmonary standpoint for surgical procedure, ultimate clearance will be decided upon by surgeon/anesthesiology.  1. Short duration of surgery as much as possible and avoid paralytic if possible 2. Surgery can be done in outpatient setting  3. DVT prophylaxis 4. Aggressive pulmonary toilet with o2, bronchodilatation, and incentive spirometry and early ambulation

## 2022-10-26 ENCOUNTER — Ambulatory Visit (INDEPENDENT_AMBULATORY_CARE_PROVIDER_SITE_OTHER): Payer: Medicare HMO | Admitting: Psychiatry

## 2022-10-26 ENCOUNTER — Encounter: Payer: Self-pay | Admitting: Psychiatry

## 2022-10-26 DIAGNOSIS — G2581 Restless legs syndrome: Secondary | ICD-10-CM | POA: Diagnosis not present

## 2022-10-26 DIAGNOSIS — F3162 Bipolar disorder, current episode mixed, moderate: Secondary | ICD-10-CM

## 2022-10-26 DIAGNOSIS — F5101 Primary insomnia: Secondary | ICD-10-CM

## 2022-10-26 MED ORDER — LAMOTRIGINE 200 MG PO TABS: ORAL_TABLET | ORAL | 3 refills | Status: AC

## 2022-10-26 MED ORDER — PRAMIPEXOLE DIHYDROCHLORIDE 0.125 MG PO TABS: 0.2500 mg | ORAL_TABLET | Freq: Every evening | ORAL | 3 refills | Status: AC

## 2022-10-26 MED ORDER — ARIPIPRAZOLE 5 MG PO TABS: 5.0000 mg | ORAL_TABLET | Freq: Every day | ORAL | 1 refills | Status: AC

## 2022-10-26 MED ORDER — TRAZODONE HCL 100 MG PO TABS: 200.0000 mg | ORAL_TABLET | Freq: Every day | ORAL | 1 refills | Status: AC

## 2022-10-26 MED ORDER — VENLAFAXINE HCL ER 150 MG PO CP24: ORAL_CAPSULE | ORAL | 1 refills | Status: AC

## 2022-10-26 NOTE — Progress Notes (Unsigned)
[name] [id number] November 27, 1960 62 y.o.  Virtual Visit via Telephone Note  I connected with pt on 10/26/22 at 12:45 PM EDT by telephone and verified that I am speaking with the correct person using two identifiers.   I discussed the limitations, risks, security and privacy concerns of performing an evaluation and management service by telephone and the availability of in person appointments. I also discussed with the patient that there may be a patient responsible charge related to this service. The patient expressed understanding and agreed to proceed.   I discussed the assessment and treatment plan with the patient. The patient was provided an opportunity to ask questions and all were answered. The patient agreed with the plan and demonstrated an understanding of the instructions.   The patient was advised to call back or seek an in-person evaluation if the symptoms worsen or if the condition fails to improve as anticipated.  I provided 27 minutes of non-face-to-face time during this encounter.  The patient was located at home.  The provider was located at [facility].   Corie Chiquito, PMHNP   Subjective:   Patient ID:  Anita Arroyo is a 62 y.o. (DOB 1960-10-17) female.  Chief Complaint:  Chief Complaint  Patient presents with   Depression    HPI Anita Arroyo presents for follow-up of Bipolar disorder and sleep disturbance.   She reports that she "put back almost all my weight, but I quit smoking about 11 weeks ago." She reports that she is "wanting something to eat all the time." She reports gaining 30-40 lbs. She reports, "some days I don't even want to go out in public because of all this weight I have gained." She reports that she continues to have cravings to smoke and at times is substituting smoking for eating. She reports that she took Chantix to help with smoking cessation and also took Ambien since Chantix interferes with her sleep. She plans to re-start [employer] and meal planning.   She reports sad mood "comes and goes." Anxiety has been ok. She reports that she had difficulty sleeping for a few weeks and is now sleeping ok, and thinks this was related to stopping Ambien. She is usually getting 7-9 hours of sleep a night. She reports low energy and motivation when she started gaining weight. She reports that she is motivated to get prepared for surgery. Concentration is adequate. Denies SI.   She is seeking clearance for reverse rotator cuff repair surgery.   Continues to dog sit.   Reports "climbing the walls" with Wellbutrin in the past.   Review of Systems:  Review of Systems  HENT:  Positive for dental problem.   Musculoskeletal:  Negative for gait problem.       Reports rotator cuff tear and bicep tear. She reports that she is going to have a reverse rotator cuff repair.   Neurological:  Positive for tremors.       She reports infrequent, mild twitches- "but nothing to explore... nothing bothers me." Improved RLS with taking 2 Pramipexole at bedtime  Psychiatric/Behavioral:         Please refer to HPI    Medications: I have reviewed the patient's current medications.  Current Outpatient Medications  Medication Sig Dispense Refill   albuterol (VENTOLIN HFA) 108 (90 Base) MCG/ACT inhaler Inhale 2 puffs into the lungs every 4 (four) hours as needed for wheezing or shortness of breath (cough, shortness of breath or wheezing.). 8 g 1  aspirin EC 81 MG tablet Take 81 mg by mouth daily. Swallow whole.     atenolol (TENORMIN) 50 MG tablet Take 50 mg in the morning and 25 mg in the evening 135 tablet 1   Biotin 5 MG CAPS Take 5 mg by mouth daily.     calcium-vitamin D (OSCAL WITH D) 500-200 MG-UNIT tablet Take 1 tablet by mouth.     cetirizine (ZYRTEC) 10 MG tablet Take 10 mg by mouth daily.     diclofenac sodium (VOLTAREN) 1 % GEL daily as needed.     Evolocumab (REPATHA SURECLICK) 140 MG/ML SOAJ INJECT 140 MG into THE SKIN EVERY 14  DAYS 6 mL 3   fluticasone (FLONASE) 50 MCG/ACT nasal spray USE 1 SPRAY IN EACH NOSTRIL TWICE DAILY 48 g 1   Fluticasone-Salmeterol 113-14 MCG/ACT AEPB Inhale 1 PUFF into the lungs 2 TIMES DAILY 1 each 11   HYDROcodone-acetaminophen (NORCO/VICODIN) 5-325 MG tablet take 1 TABLET BY MOUTH EVERY 6 HOURS AS NEEDED for moderate pain 120 tablet 0   L-Methylfolate 15 MG TABS Take 1 tablet (15 mg total) by mouth daily. 90 tablet 1   LUTEIN PO Take by mouth daily.     Magnesium 500 MG TABS Take 250 mg by mouth daily.     montelukast (SINGULAIR) 10 MG tablet Take 1 tablet (10 mg total) by mouth at bedtime. 90 tablet 3   pantoprazole (PROTONIX) 40 MG tablet Take 1 tablet (40 mg total) by mouth daily. 90 tablet 1   triamterene-hydrochlorothiazide (MAXZIDE-25) 37.5-25 MG tablet Take 0.5 tablets daily by mouth. 90 tablet 3   Alcohol Swabs (B-D SINGLE USE SWABS REGULAR) PADS      ARIPiprazole (ABILIFY) 5 MG tablet Take 1 tablet (5 mg total) by mouth daily. 90 tablet 1   lamoTRIgine (LAMICTAL) 200 MG tablet TAKE 1/2 TABLET EVERY MORNING  AND TAKE 2 TABLETS AT BEDTIME 225 tablet 3   meclizine (ANTIVERT) 25 MG tablet Take 1 tablet (25 mg total) by mouth as needed for dizziness. (Patient not taking: Reported on 10/26/2022) 30 tablet 1   pramipexole (MIRAPEX) 0.125 MG tablet Take 2 tablets (0.25 mg total) by mouth every evening. 180 tablet 3   traZODone (DESYREL) 100 MG tablet Take 2 tablets (200 mg total) by mouth at bedtime. 180 tablet 1   triamcinolone cream (KENALOG) 0.1 % Apply 1 application topically 2 (two) times daily. 30 g 0   venlafaxine XR (EFFEXOR-XR) 150 MG 24 hr capsule TAKE 2 CAPSULES EVERY DAY WITH BREAKFAST 180 capsule 1   No current facility-administered medications for this visit.    Medication Side Effects: None  Allergies:  Allergies  Allergen Reactions   Contrast Media [Iodinated Contrast Media] Anaphylaxis    Anaphylactic shock.   Celecoxib     "I climb the walls"  Other Reaction(s):  Other (See Comments)  "I climb the walls", "I climb the walls"   Ezetimibe Other (See Comments)    "I can't take any cholesterol medicines."  Other Reaction(s): Other (See Comments)   Statins Other (See Comments)    Body aches, constipation AND DEPRESSION  Body aches, constipation  Other Reaction(s): Other (See Comments)  Body aches, constipation, Body aches, constipation AND DEPRESSION   Sulfa Antibiotics Nausea And Vomiting    Other Reaction(s): GI Intolerance   Augmentin [Amoxicillin-Pot Clavulanate] Rash and Other (See Comments)    Has patient had a PCN reaction causing immediate rash, facial/tongue/throat swelling, SOB or lightheadedness with hypotension: No Has patient had a PCN  reaction causing severe rash involving mucus membranes or skin necrosis: No Has patient had a PCN reaction that required hospitalization No Has patient had a PCN reaction occurring within the last 10 years: No If all of the above answers are "NO", then may proceed with Cephalosporin use.   Ceclor [Cefaclor] Rash   Moxifloxacin Rash   Sulfonamide Derivatives Nausea And Vomiting    Past Medical History:  Diagnosis Date   Allergy    pollen and environmental   Anxiety    Arthritis    Asthma    Bipolar 1 disorder (HCC)    Bipolar 1 disorder (HCC)    Breast cancer (HCC)    Cataract    COPD (chronic obstructive pulmonary disease) (HCC)    Depression    Dysrhythmia    palpitations   Dysrhythmia    atrial fibrillation   Fibromyalgia    Finger fracture 2011   GERD (gastroesophageal reflux disease)    Heart murmur    History of stress test 04/28/2011   showed normal perfusion without scar or ischemia   Hx of echocardiogram 04/28/2011   showed normal systolic function with mild diastolic dysfunction,she had trace MR but did not have frank mitral valve prolapse demonstrated. She had mild pulmonary hypertension with an estimated RV systolic pressure at 34 mm.   Hyperlipemia    Meniere disease     MVP (mitral valve prolapse)    Neuromuscular disorder (HCC)    fibromyalgia    Family History  Problem Relation Age of Onset   Breast cancer Mother        biological mother   Mitral valve prolapse Mother    Heart Problems Mother    Cancer Mother    Colon cancer Maternal Grandfather    Esophageal cancer Neg Hx    Rectal cancer Neg Hx    Stomach cancer Neg Hx     Social History   Socioeconomic History   Marital status: Single    Spouse name: Not on file   Number of children: 0   Years of education: Not on file   Highest education level: Not on file  Occupational History   Not on file  Tobacco Use   Smoking status: Former    Average packs/day: 2.0 packs/day for 1 year (2.0 ttl pk-yrs)    Types: Cigarettes    Start date: 51    Quit date: 01/26/1975    Years since quitting: 47.7   Smokeless tobacco: Never   Tobacco comments:    Quit smoking 11 weeks.  hfb  Vaping Use   Vaping status: Some Days   Start date: 06/23/2020   Substances: Nicotine  Substance and Sexual Activity   Alcohol use: Not Currently    Comment: less than monthly   Drug use: No   Sexual activity: Not Currently  Other Topics Concern   Not on file  Social History Narrative   Not on file   Social Determinants of Health   Financial Resource Strain: Medium Risk (04/06/2022)   Received from [facility], [facility]   Overall Financial Resource Strain (CARDIA)    Difficulty of Paying Living Expenses: Somewhat hard  Food Insecurity: No Food Insecurity (04/06/2022)   Received from [facility], [facility]   Hunger Vital Sign    Worried About Running Out of Food in the Last Year: Never true    Ran Out of Food in the Last Year: Never true  Transportation Needs: No Transportation Needs (04/06/2022)   [facility]   PRAPARE - Transportation    Lack of Transportation (Medical): No    Lack of Transportation (Non-Medical): No  Physical Activity: Insufficiently Active  (04/06/2022)   Received from [facility], [facility]   Exercise Vital Sign    Days of Exercise per Week: 1 day    Minutes of Exercise per Session: 120 min  Stress: No Stress Concern Present (04/06/2022)   Received from [facility] of Occupational Health - Occupational Stress Questionnaire    Feeling of Stress : Only a little  Recent Concern: Stress - Stress Concern Present (03/07/2022)   Received from [facility] of Occupational Health - Occupational Stress Questionnaire    Feeling of Stress : To some extent  Social Connections: Socially Integrated (03/07/2022)   Received from [facility], [facility]   Social Network    How would you rate your social network (family, work, friends)?: Good participation with social networks  Intimate Partner Violence: Not At Risk (04/06/2022)   Received from [facility], [facility]   HITS    Over the last 12 months how often did your partner physically hurt you?: 1    Over the last 12 months how often did your partner insult you or talk down to you?: 1    Over the last 12 months how often did your partner threaten you with physical harm?: 1    Over the last 12 months how often did your partner scream or curse at you?: 1    Past Medical History, Surgical history, Social history, and Family history were reviewed and updated as appropriate.   Please see review of systems for further details on the patient's review from today.   Objective:   Physical Exam:  LMP 05/30/2011   Physical Exam Constitutional:      General: She is not in acute distress. Musculoskeletal:        General: No deformity.  Neurological:     Mental Status: She is alert and oriented to person, place, and time.     Coordination: Coordination normal.  Psychiatric:        Attention and Perception: Attention and perception normal. She does not perceive auditory or visual hallucinations.        Mood and Affect:  Mood is not anxious. Affect is not labile, blunt, angry or inappropriate.        Speech: Speech normal.        Behavior: Behavior normal.        Thought Content: Thought content normal. Thought content is not paranoid or delusional. Thought content does not include homicidal or suicidal ideation. Thought content does not include homicidal or suicidal plan.        Cognition and Memory: Cognition and memory normal.        Judgment: Judgment normal.     Comments: Insight intact Mood is mildly depressed in response to weight gain that has occurred with smoking cessation     Lab Review:     Component Value Date/Time   NA 140 11/25/2017 1155   NA 138 08/20/2014 1117   K 4.3 11/25/2017 1155   K 4.7 08/20/2014 1117   CL 98 11/25/2017 1155   CL 104 06/29/2012 1221   CO2 27 11/25/2017 1155   CO2 26 08/20/2014 1117   GLUCOSE 84 11/25/2017 1155   GLUCOSE 82 03/13/2016 1720   GLUCOSE 92 08/20/2014 1117   GLUCOSE 107 (H)  06/29/2012 1221   BUN 17 11/25/2017 1155   BUN 16.0 08/20/2014 1117   CREATININE 0.93 11/25/2017 1155   CREATININE 0.9 08/20/2014 1117   CALCIUM 9.8 11/25/2017 1155   CALCIUM 9.4 08/20/2014 1117   PROT 6.7 11/25/2017 1155   PROT 6.6 08/20/2014 1117   ALBUMIN 4.7 11/25/2017 1155   ALBUMIN 3.9 08/20/2014 1117   AST 21 11/25/2017 1155   AST 22 08/20/2014 1117   ALT 21 11/25/2017 1155   ALT 18 08/20/2014 1117   ALKPHOS 109 11/25/2017 1155   ALKPHOS 54 08/20/2014 1117   BILITOT <0.2 11/25/2017 1155   BILITOT 0.47 08/20/2014 1117   GFRNONAA 68 11/25/2017 1155   GFRAA 78 11/25/2017 1155       Component Value Date/Time   WBC 6.4 08/12/2016 1659   WBC 10.6 (H) 03/13/2016 1720   RBC 4.53 08/12/2016 1659   RBC 4.87 03/13/2016 1720   HGB 14.2 08/12/2016 1659   HGB 13.7 08/20/2014 1117   HCT 41.9 08/12/2016 1659   HCT 40.3 08/20/2014 1117   PLT 230 08/12/2016 1659   MCV 93 08/12/2016 1659   MCV 94.1 08/20/2014 1117   MCH 31.3 08/12/2016 1659   MCH 30.6 03/13/2016  1720   MCHC 33.9 08/12/2016 1659   MCHC 34.3 03/13/2016 1720   RDW 14.1 08/12/2016 1659   RDW 13.2 08/20/2014 1117   LYMPHSABS 2.4 03/13/2016 1720   LYMPHSABS 2.0 08/20/2014 1117   MONOABS 0.9 03/13/2016 1720   MONOABS 0.5 08/20/2014 1117   EOSABS 0.0 03/13/2016 1720   EOSABS 0.1 08/20/2014 1117   BASOSABS 0.0 03/13/2016 1720   BASOSABS 0.1 08/20/2014 1117    No results found for: "POCLITH", "LITHIUM"   No results found for: "PHENYTOIN", "PHENOBARB", "VALPROATE", "CBMZ"   .res Assessment: Plan:   31 minutes spent dedicated to the care of this patient on the date of this encounter to include pre-visit review of records, ordering of medication, post visit documentation, and face-to-face time with the patient discussing increase in appetite and subsequent weight gain following smoking cessation and the effect that this has had on her mood. She declines any need for a medication change at this time and anticipates that her mood will improve once she is able to start weight loss efforts that have been effective in the past and also when physical issues improve after surgery. Discussed that she had adverse effects with Wellbutrin in the past and this is therefore not recommended at this time to help with low mood, smoking cessation, or increased appetite.  Discussed clearance for surgery and that there are no psychiatric contraindications for surgery. Pt presents as motivated towards surgery and recovery.  Will continue current medications without changes.  Continue Abilify 5 mg daily for mood symptoms.  Continue Lamictal 200 mg 1/2 tablet in the morning and 400 mg at bedtime for mood stabilization.  Continue L-Methylfolate 15 mg daily for augmentation of depression.  Continue Trazodone 200 mg at bedtime for insomnia.  Continue Effexor XR 300 mg daily for depression.  Pt to follow-up in 6 months or sooner if clinically indicated.  Patient advised to contact office with any questions, adverse  effects, or acute worsening in signs and symptoms.    Sheletha was seen today for depression.  Diagnoses and all orders for this visit:  Bipolar 1 disorder, mixed, moderate (HCC) -     venlafaxine XR (EFFEXOR-XR) 150 MG 24 hr capsule; TAKE 2 CAPSULES EVERY DAY WITH BREAKFAST -  ARIPiprazole (ABILIFY) 5 MG tablet; Take 1 tablet (5 mg total) by mouth daily. -     lamoTRIgine (LAMICTAL) 200 MG tablet; TAKE 1/2 TABLET EVERY MORNING  AND TAKE 2 TABLETS AT BEDTIME  RLS (restless legs syndrome) -     pramipexole (MIRAPEX) 0.125 MG tablet; Take 2 tablets (0.25 mg total) by mouth every evening.  Primary insomnia -     traZODone (DESYREL) 100 MG tablet; Take 2 tablets (200 mg total) by mouth at bedtime.    Please see After Visit Summary for patient specific instructions.  No future appointments.  No orders of the defined types were placed in this encounter.     -------------------------------

## 2022-10-26 NOTE — Telephone Encounter (Signed)
Note from 10/18/22 was faxed to Tami Ribas- [phone]

## 2022-10-27 ENCOUNTER — Telehealth: Payer: Self-pay | Admitting: Psychiatry

## 2022-10-27 NOTE — Telephone Encounter (Signed)
Done

## 2022-10-27 NOTE — Telephone Encounter (Signed)
Form completed and placed in office inbox. Please fax form and notify patient that form has been completed and faxed.

## 2022-10-27 NOTE — Telephone Encounter (Signed)
Received by fax a Preoperative Risk Assessment regarding Anita Arroyo, from [facility] in [place], [place]. Placed in [name]'s box for completion. Needs faxed to office at [phone].

## 2022-11-01 NOTE — Telephone Encounter (Signed)
Office is calling back to say they didn't received the clearance. Please advise

## 2022-11-02 NOTE — Telephone Encounter (Signed)
Routed Dr. Ave Filter pre op tele phone notes.

## 2022-12-08 ENCOUNTER — Encounter: Payer: Self-pay | Admitting: Psychiatry

## 2022-12-14 ENCOUNTER — Other Ambulatory Visit: Payer: Self-pay | Admitting: Orthopedic Surgery

## 2022-12-15 ENCOUNTER — Other Ambulatory Visit: Payer: Self-pay | Admitting: Orthopedic Surgery

## 2022-12-17 NOTE — Patient Instructions (Signed)
DUE TO COVID-19 ONLY TWO VISITORS  (aged 63 and older)  ARE ALLOWED TO COME WITH YOU AND STAY IN THE WAITING ROOM ONLY DURING PRE OP AND PROCEDURE.   **NO VISITORS ARE ALLOWED IN THE SHORT STAY AREA OR RECOVERY ROOM!!**  IF YOU WILL BE ADMITTED INTO THE HOSPITAL YOU ARE ALLOWED ONLY FOUR SUPPORT PEOPLE DURING VISITATION HOURS ONLY (7 AM -8PM)   The support person(s) must pass our screening, gel in and out, and wear a mask at all times, including in the patient's room. Patients must also wear a mask when staff or their support person are in the room. Visitors GUEST BADGE MUST BE WORN VISIBLY  One adult visitor may remain with you overnight and MUST be in the room by 8 P.M.     Your procedure is scheduled on: 12/30/22   Report to [facility] Main Entrance    Report to admitting at : 5:15 AM   Call this number if you have problems the morning of surgery [phone]   Do not eat food :After Midnight.   After Midnight you may have the following liquids until : 4:30 AM DAY OF SURGERY  Water Black Coffee (sugar ok, NO MILK/CREAM OR CREAMERS)  Tea (sugar ok, NO MILK/CREAM OR CREAMERS) regular and decaf                             Plain Jell-O (NO RED)                                           Fruit ices (not with fruit pulp, NO RED)                                     Popsicles (NO RED)                                                                  Juice: apple, WHITE grape, WHITE cranberry Sports drinks like Gatorade (NO RED)   The day of surgery:  Drink ONE (1) Pre-Surgery Clear Ensure at : 4:30 AM the morning of surgery. Drink in one sitting. Do not sip.  This drink was given to you during your hospital  pre-op appointment visit. Nothing else to drink after completing the  Pre-Surgery Clear Ensure or G2.          If you have questions, please contact your surgeon's office.  FOLLOW ANY ADDITIONAL PRE OP INSTRUCTIONS YOU RECEIVED FROM YOUR SURGEON'S OFFICE!!!   Oral  Hygiene is also important to reduce your risk of infection.                                    Remember - BRUSH YOUR TEETH THE MORNING OF SURGERY WITH YOUR REGULAR TOOTHPASTE  DENTURES WILL BE REMOVED PRIOR TO SURGERY PLEASE DO NOT APPLY "Poly grip" OR ADHESIVES!!!   Do NOT smoke after Midnight   Take these medicines the morning of surgery  with A SIP OF WATER: venlafaxine XR,cetirizine,aripiprazole,atenolol,pantoprazole.Use inhalers as usual.Meclizine as needed.                  You may not have any metal on your body including hair pins, jewelry, and body piercing             Do not wear make-up, lotions, powders, perfumes/cologne, or deodorant  Do not wear nail polish including gel and S&S, artificial/acrylic nails, or any other type of covering on natural nails including finger and toenails. If you have artificial nails, gel coating, etc. that needs to be removed by a nail salon please have this removed prior to surgery or surgery may need to be canceled/ delayed if the surgeon/ anesthesia feels like they are unable to be safely monitored.   Do not shave  48 hours prior to surgery.   Do not bring valuables to the hospital. [facility] IS NOT             RESPONSIBLE   FOR VALUABLES.   Contacts, glasses, or bridgework may not be worn into surgery.   Bring small overnight bag day of surgery.   DO NOT BRING YOUR HOME MEDICATIONS TO THE HOSPITAL. PHARMACY WILL DISPENSE MEDICATIONS LISTED ON YOUR MEDICATION LIST TO YOU DURING YOUR ADMISSION IN THE HOSPITAL!    Patients discharged on the day of surgery will not be allowed to drive home.  Someone NEEDS to stay with you for the first 24 hours after anesthesia.   Special Instructions: Bring a copy of your healthcare power of attorney and living will documents         the day of surgery if you haven't scanned them before.              Please read over the following fact sheets you were given: IF YOU HAVE QUESTIONS ABOUT YOUR PRE-OP  INSTRUCTIONS PLEASE CALL [phone]  [facility]- Preparing for Total Shoulder Arthroplasty    Before surgery, you can play an important role. Because skin is not sterile, your skin needs to be as free of germs as possible. You can reduce the number of germs on your skin by using the following products. Benzoyl Peroxide Gel Reduces the number of germs present on the skin Applied twice a day to shoulder area starting two days before surgery    ==================================================================  Please follow these instructions carefully:  BENZOYL PEROXIDE 5% GEL  Please do not use if you have an allergy to benzoyl peroxide.   If your skin becomes reddened/irritated stop using the benzoyl peroxide.  Starting two days before surgery, apply as follows: Apply benzoyl peroxide in the morning and at night. Apply after taking a shower. If you are not taking a shower clean entire shoulder front, back, and side along with the armpit with a clean wet washcloth.  Place a quarter-sized dollop on your shoulder and rub in thoroughly, making sure to cover the front, back, and side of your shoulder, along with the armpit.   2 days before ____ AM   ____ PM              1 day before ____ AM   ____ PM                         Do this twice a day for two days.  (Last application is the night before surgery, AFTER using the CHG soap as described below).  Do NOT apply  benzoyl peroxide gel on the day of surgery.     Pre-operative 5 CHG Bath Instructions   You can play a key role in reducing the risk of infection after surgery. Your skin needs to be as free of germs as possible. You can reduce the number of germs on your skin by washing with CHG (chlorhexidine gluconate) soap before surgery. CHG is an antiseptic soap that kills germs and continues to kill germs even after washing.   DO NOT use if you have an allergy to chlorhexidine/CHG or antibacterial soaps. If your skin becomes reddened  or irritated, stop using the CHG and notify one of our RNs at : [phone].   Please shower with the CHG soap starting 4 days before surgery using the following schedule:     Please keep in mind the following:  DO NOT shave, including legs and underarms, starting the day of your first shower.   You may shave your face at any point before/day of surgery.  Place clean sheets on your bed the day you start using CHG soap. Use a clean washcloth (not used since being washed) for each shower. DO NOT sleep with pets once you start using the CHG.   CHG Shower Instructions:  If you choose to wash your hair and private area, wash first with your normal shampoo/soap.  After you use shampoo/soap, rinse your hair and body thoroughly to remove shampoo/soap residue.  Turn the water OFF and apply about 3 tablespoons (45 ml) of CHG soap to a CLEAN washcloth.  Apply CHG soap ONLY FROM YOUR NECK DOWN TO YOUR TOES (washing for 3-5 minutes)  DO NOT use CHG soap on face, private areas, open wounds, or sores.  Pay special attention to the area where your surgery is being performed.  If you are having back surgery, having someone wash your back for you may be helpful. Wait 2 minutes after CHG soap is applied, then you may rinse off the CHG soap.  Pat dry with a clean towel  Put on clean clothes/pajamas   If you choose to wear lotion, please use ONLY the CHG-compatible lotions on the back of this paper.     Additional instructions for the day of surgery: DO NOT APPLY any lotions, deodorants, cologne, or perfumes.   Put on clean/comfortable clothes.  Brush your teeth.  Ask your nurse before applying any prescription medications to the skin.   CHG Compatible Lotions   Aveeno Moisturizing lotion  Cetaphil Moisturizing Cream  Cetaphil Moisturizing Lotion  Clairol Herbal Essence Moisturizing Lotion, Dry Skin  Clairol Herbal Essence Moisturizing Lotion, Extra Dry Skin  Clairol Herbal Essence Moisturizing  Lotion, Normal Skin  Curel Age Defying Therapeutic Moisturizing Lotion with Alpha Hydroxy  Curel Extreme Care Body Lotion  Curel Soothing Hands Moisturizing Hand Lotion  Curel Therapeutic Moisturizing Cream, Fragrance-Free  Curel Therapeutic Moisturizing Lotion, Fragrance-Free  Curel Therapeutic Moisturizing Lotion, Original Formula  Eucerin Daily Replenishing Lotion  Eucerin Dry Skin Therapy Plus Alpha Hydroxy Crme  Eucerin Dry Skin Therapy Plus Alpha Hydroxy Lotion  Eucerin Original Crme  Eucerin Original Lotion  Eucerin Plus Crme Eucerin Plus Lotion  Eucerin TriLipid Replenishing Lotion  Keri Anti-Bacterial Hand Lotion  Keri Deep Conditioning Original Lotion Dry Skin Formula Softly Scented  Keri Deep Conditioning Original Lotion, Fragrance Free Sensitive Skin Formula  Keri Lotion Fast Absorbing Fragrance Free Sensitive Skin Formula  Keri Lotion Fast Absorbing Softly Scented Dry Skin Formula  Keri Original Lotion  Keri Skin Renewal Lotion  Keri Silky Smooth Lotion  Keri Silky Smooth Sensitive Skin Lotion  Nivea Body Creamy Conditioning Oil  Nivea Body Extra Enriched Teacher, adult education Moisturizing Lotion Nivea Crme  Nivea Skin Firming Lotion  NutraDerm 30 Skin Lotion  NutraDerm Skin Lotion  NutraDerm Therapeutic Skin Cream  NutraDerm Therapeutic Skin Lotion  ProShield Protective Hand Cream  Provon moisturizing lotion   Incentive Spirometer  An incentive spirometer is a tool that can help keep your lungs clear and active. This tool measures how well you are filling your lungs with each breath. Taking long deep breaths may help reverse or decrease the chance of developing breathing (pulmonary) problems (especially infection) following: A long period of time when you are unable to move or be active. BEFORE THE PROCEDURE  If the spirometer includes an indicator to show your best effort, your nurse or respiratory therapist will set it to a  desired goal. If possible, sit up straight or lean slightly forward. Try not to slouch. Hold the incentive spirometer in an upright position. INSTRUCTIONS FOR USE  Sit on the edge of your bed if possible, or sit up as far as you can in bed or on a chair. Hold the incentive spirometer in an upright position. Breathe out normally. Place the mouthpiece in your mouth and seal your lips tightly around it. Breathe in slowly and as deeply as possible, raising the piston or the ball toward the top of the column. Hold your breath for 3-5 seconds or for as long as possible. Allow the piston or ball to fall to the bottom of the column. Remove the mouthpiece from your mouth and breathe out normally. Rest for a few seconds and repeat Steps 1 through 7 at least 10 times every 1-2 hours when you are awake. Take your time and take a few normal breaths between deep breaths. The spirometer may include an indicator to show your best effort. Use the indicator as a goal to work toward during each repetition. After each set of 10 deep breaths, practice coughing to be sure your lungs are clear. If you have an incision (the cut made at the time of surgery), support your incision when coughing by placing a pillow or rolled up towels firmly against it. Once you are able to get out of bed, walk around indoors and cough well. You may stop using the incentive spirometer when instructed by your caregiver.  RISKS AND COMPLICATIONS Take your time so you do not get dizzy or light-headed. If you are in pain, you may need to take or ask for pain medication before doing incentive spirometry. It is harder to take a deep breath if you are having pain. AFTER USE Rest and breathe slowly and easily. It can be helpful to keep track of a log of your progress. Your caregiver can provide you with a simple table to help with this. If you are using the spirometer at home, follow these instructions: SEEK MEDICAL CARE IF:  You are having  difficultly using the spirometer. You have trouble using the spirometer as often as instructed. Your pain medication is not giving enough relief while using the spirometer. You develop fever of 100.5 F (38.1 C) or higher. SEEK IMMEDIATE MEDICAL CARE IF:  You cough up bloody sputum that had not been present before. You develop fever of 102 F (38.9 C) or greater. You develop worsening pain at or near the incision site. MAKE SURE YOU:  Understand these instructions.  Will watch your condition. Will get help right away if you are not doing well or get worse. Document Released: 05/24/2006 Document Revised: 04/05/2011 Document Reviewed: 07/25/2006 [facility] Patient Information 2014 [place], [place].   ________________________________________________________________________

## 2022-12-20 ENCOUNTER — Encounter (HOSPITAL_COMMUNITY): Payer: Self-pay

## 2022-12-20 ENCOUNTER — Other Ambulatory Visit: Payer: Self-pay

## 2022-12-20 ENCOUNTER — Ambulatory Visit (HOSPITAL_COMMUNITY)
Admission: RE | Admit: 2022-12-20 | Discharge: 2022-12-20 | Disposition: A | Discharge status: Home / Self Care | Payer: Medicare HMO | Source: Ambulatory Visit | Attending: Orthopedic Surgery | Admitting: Orthopedic Surgery

## 2022-12-20 ENCOUNTER — Encounter (HOSPITAL_COMMUNITY)
Admission: RE | Admit: 2022-12-20 | Discharge: 2022-12-20 | Disposition: A | Discharge status: Home / Self Care | Payer: Medicare HMO | Source: Ambulatory Visit | Attending: Orthopedic Surgery | Admitting: Orthopedic Surgery

## 2022-12-20 VITALS — BP 116/57 | HR 61 | Temp 99.1°F | Ht 62.0 in | Wt 187.0 lb

## 2022-12-20 DIAGNOSIS — J449 Chronic obstructive pulmonary disease, unspecified: Secondary | ICD-10-CM | POA: Diagnosis not present

## 2022-12-20 DIAGNOSIS — Z96612 Presence of left artificial shoulder joint: Secondary | ICD-10-CM | POA: Insufficient documentation

## 2022-12-20 DIAGNOSIS — Z01812 Encounter for preprocedural laboratory examination: Secondary | ICD-10-CM | POA: Diagnosis present

## 2022-12-20 DIAGNOSIS — M75101 Unspecified rotator cuff tear or rupture of right shoulder, not specified as traumatic: Secondary | ICD-10-CM | POA: Insufficient documentation

## 2022-12-20 DIAGNOSIS — Z01818 Encounter for other preprocedural examination: Secondary | ICD-10-CM | POA: Insufficient documentation

## 2022-12-20 DIAGNOSIS — I480 Paroxysmal atrial fibrillation: Secondary | ICD-10-CM | POA: Insufficient documentation

## 2022-12-20 DIAGNOSIS — Z01811 Encounter for preprocedural respiratory examination: Secondary | ICD-10-CM | POA: Diagnosis present

## 2022-12-20 DIAGNOSIS — I10 Essential (primary) hypertension: Secondary | ICD-10-CM | POA: Diagnosis not present

## 2022-12-20 HISTORY — DX: Prediabetes: R73.03

## 2022-12-20 LAB — CBC
HCT: 36.7 % (ref 36.0–46.0)
Hemoglobin: 12.5 g/dL (ref 12.0–15.0)
MCH: 32.1 pg (ref 26.0–34.0)
MCHC: 34.1 g/dL (ref 30.0–36.0)
MCV: 94.1 fL (ref 80.0–100.0)
Platelets: 231 10*3/uL (ref 150–400)
RBC: 3.9 MIL/uL (ref 3.87–5.11)
RDW: 12.2 % (ref 11.5–15.5)
WBC: 6.4 10*3/uL (ref 4.0–10.5)
nRBC: 0 % (ref 0.0–0.2)

## 2022-12-20 LAB — COMPREHENSIVE METABOLIC PANEL
ALT: 30 U/L (ref 0–44)
AST: 26 U/L (ref 15–41)
Albumin: 4.2 g/dL (ref 3.5–5.0)
Alkaline Phosphatase: 82 U/L (ref 38–126)
Anion gap: 8 (ref 5–15)
BUN: 33 mg/dL — ABNORMAL HIGH (ref 8–23)
CO2: 28 mmol/L (ref 22–32)
Calcium: 9.9 mg/dL (ref 8.9–10.3)
Chloride: 100 mmol/L (ref 98–111)
Creatinine, Ser: 0.82 mg/dL (ref 0.44–1.00)
GFR, Estimated: >60 mL/min (ref >60)
Glucose, Bld: 87 mg/dL (ref 70–99)
Potassium: 4.1 mmol/L (ref 3.5–5.1)
Sodium: 136 mmol/L (ref 135–145)
Total Bilirubin: 0.6 mg/dL (ref <1.2)
Total Protein: 7.3 g/dL (ref 6.5–8.1)

## 2022-12-20 LAB — SURGICAL PCR SCREEN
MRSA, PCR: NEGATIVE
Staphylococcus aureus: NEGATIVE

## 2022-12-20 NOTE — Progress Notes (Signed)
For Anesthesia: PCP - Porfirio Oar, PA  Cardiologist - Lennette Bihari, MD  Clearance: Edd Fabian: NP: 10/13/22 Bowel Prep reminder:  Chest x-ray - CT chest: 09/27/22 EKG - 05/07/22 Stress Test - 2013 ECHO - 07/08/16 Cardiac Cath -  Pacemaker/ICD device last checked: Pacemaker orders received: Device Rep notified:  Spinal Cord Stimulator: N/A  Sleep Study - N/A CPAP -   Fasting Blood Sugar - N/A Checks Blood Sugar _____ times a day Date and result of last Hgb A1c- 5.3: 11/25/22  Last dose of GLP1 agonist- N/A GLP1 instructions:   Last dose of SGLT-2 inhibitors- N/A SGLT-2 instructions:   Blood Thinner Instructions: Aspirin Instructions: To hold a week before surgery. Last Dose:  Activity level: Can go up a flight of stairs and activities of daily living without stopping and without chest pain and/or shortness of breath    Unable to go up a flight of stairs without shortness of breath    Anesthesia review: Hx: COPD.SSOB,Afib,MVP,Palpitations,Murmur.,Pre-DIA.  Patient denies shortness of breath, fever, cough and chest pain at PAT appointment   Patient verbalized understanding of instructions that were given to them at the PAT appointment. Patient was also instructed that they will need to review over the PAT instructions again at home before surgery.

## 2022-12-21 NOTE — Anesthesia Preprocedure Evaluation (Addendum)
Anesthesia Evaluation  Patient identified by MRN, date of birth, ID band Patient awake    Reviewed: Allergy & Precautions, NPO status , Patient's Chart, lab work & pertinent test results, reviewed documented beta blocker date and time   Airway Mallampati: III  TM Distance: >3 FB Neck ROM: Full    Dental  (+) Dental Advisory Given, Missing,    Pulmonary asthma , COPD,  COPD inhaler, former smoker   Pulmonary exam normal breath sounds clear to auscultation       Cardiovascular hypertension, Pt. on home beta blockers and Pt. on medications Normal cardiovascular exam+ dysrhythmias Atrial Fibrillation + Valvular Problems/Murmurs MVP  Rhythm:Regular Rate:Normal     Neuro/Psych  Headaches PSYCHIATRIC DISORDERS Anxiety Depression Bipolar Disorder      GI/Hepatic Neg liver ROS,GERD  Medicated,,  Endo/Other    Class 3 obesity  Renal/GU negative Renal ROS     Musculoskeletal  (+) Arthritis ,  Fibromyalgia -RIGHT SHOULDER ROTATOR CUFF TEAR ARTHROPATHY   Abdominal   Peds  Hematology negative hematology ROS (+)   Anesthesia Other Findings   Reproductive/Obstetrics                             Anesthesia Physical Anesthesia Plan  ASA: 3  Anesthesia Plan: General   Post-op Pain Management: Regional block* and Tylenol PO (pre-op)*   Induction: Intravenous  PONV Risk Score and Plan: 3 and Midazolam, Dexamethasone and Ondansetron  Airway Management Planned: Oral ETT  Additional Equipment:   Intra-op Plan:   Post-operative Plan: Extubation in OR  Informed Consent:   Plan Discussed with:   Anesthesia Plan Comments: (See PAT note 12/20/2022)       Anesthesia Quick Evaluation

## 2022-12-21 NOTE — Progress Notes (Signed)
Anesthesia Chart Review   Case: [id number] Date/Time: 12/30/22 0715   Procedure: REVERSE SHOULDER ARTHROPLASTY (Right: Shoulder)   Anesthesia type: Choice   Pre-op diagnosis: RIGHT SHOULDER ROTATOR CUFF TEAR ARTHROPATHY   Location: [facility] ROOM 07 / [facility]   Surgeons: Jones Broom, MD       DISCUSSION:62 y.o. former smoker with h/o COPD, paroxysmal atrial fibrillation, right shoulder cuff tear scheduled for above procedure 12/30/2022 with Dr. Jones Broom.   Pt last seen by pulmonology 10/18/22. Per OV note, " Improved; Breathing is stable, cough has resolved. No recent exacerbations requiring antibiotics or steroids.  Rare SABA use.  Continue fluticasone salmeterol as directed.    Patient will be consider low risk for prolonged mechanical ventilation and/or post op pulmonary complication. She is optimized from a pulmonary standpoint for surgical procedure, ultimate clearance will be decided upon by surgeon/anesthesiology.   1. Short duration of surgery as much as possible and avoid paralytic if possible 2. Surgery can be done in outpatient setting  3. DVT prophylaxis 4. Aggressive pulmonary toilet with o2, bronchodilatation, and incentive spirometry and early ambulation"  Per cardiology preoperative evaluation 10/13/2022, "Chart reviewed as part of pre-operative protocol coverage. Given past medical history and time since last visit, based on ACC/AHA guidelines, ALEIJAH RUNNELS would be at acceptable risk for the planned procedure without further cardiovascular testing.    Her RCRI is a class II risk, 0.9% risk of major cardiac event.  She is able to complete greater than 4 METS of physical activity."   VS: BP (!) 116/57   Pulse 61   Temp 37.3 C (Oral)   Ht 5\' 2"  (1.575 m)   Wt 84.8 kg   LMP 05/30/2011   SpO2 95%   BMI 34.20 kg/m   PROVIDERS: Porfirio Oar, PA is PCP   Cardiologist - Lennette Bihari, MD  LABS: Labs reviewed: Acceptable for surgery. (all labs ordered are  listed, but only abnormal results are displayed)  Labs Reviewed  COMPREHENSIVE METABOLIC PANEL - Abnormal; Notable for the following components:      Result Value   BUN 33 (*)    All other components within normal limits  SURGICAL PCR SCREEN  CBC     IMAGES:   EKG:   CV: Echo 07/08/2016 Study Conclusions   - Left ventricle: The cavity size was normal. Wall thickness was    normal. Systolic function was normal. The estimated ejection    fraction was in the range of 60% to 65%. Doppler parameters are    consistent with abnormal left ventricular relaxation (grade 1    diastolic dysfunction).   Myocardial Perfusion 08/12/2016 The left ventricular ejection fraction is normal (55-65%). Nuclear stress EF: 58%. There was no ST segment deviation noted during stress. No T wave inversion was noted during stress. The study is normal. This is a low risk study.   Past Medical History:  Diagnosis Date   Allergy    pollen and environmental   Anxiety    Arthritis    Asthma    Bipolar 1 disorder (HCC)    Bipolar 1 disorder (HCC)    Breast cancer (HCC)    Cataract    COPD (chronic obstructive pulmonary disease) (HCC)    Depression    Dysrhythmia    palpitations   Dysrhythmia    atrial fibrillation   Fibromyalgia    Finger fracture 2011   GERD (gastroesophageal reflux disease)    Heart murmur    History of stress  test 04/28/2011   showed normal perfusion without scar or ischemia   Hx of echocardiogram 04/28/2011   showed normal systolic function with mild diastolic dysfunction,she had trace MR but did not have frank mitral valve prolapse demonstrated. She had mild pulmonary hypertension with an estimated RV systolic pressure at 34 mm.   Hyperlipemia    Meniere disease    MVP (mitral valve prolapse)    Neuromuscular disorder (HCC)    fibromyalgia   Pre-diabetes     Past Surgical History:  Procedure Laterality Date   ANKLE SURGERY     left x4   BREAST LUMPECTOMY Right  2011   BREAST SURGERY  2011   lumpectomy right breast    COLONOSCOPY     COLONOSCOPY WITH PROPOFOL N/A 02/25/2014   Procedure: COLONOSCOPY WITH PROPOFOL;  Surgeon: Charolett Bumpers, MD;  Location: [facility];  Service: Endoscopy;  Laterality: N/A;   deviated septum     ECTOPIC PREGNANCY SURGERY     ELBOW SURGERY     right elbow   EYE SURGERY Bilateral    cataract surgery   fibroid     2-3 fibroid adenomas removed   HAND SURGERY Left 2020   fall from Dog, pinkie finger, ring finger, middle finger   JOINT REPLACEMENT  2011   right knee    KNEE SURGERY     left knee   POLYPECTOMY     SHOULDER ARTHROSCOPY WITH BICEPSTENOTOMY Right 09/25/2015   Procedure: SHOULDER ARTHROSCOPY WITH BICEPSTENOTOMY;  Surgeon: Loreta Ave, MD;  Location: [facility];  Service: Orthopedics;  Laterality: Right;   SHOULDER ARTHROSCOPY WITH DISTAL CLAVICLE RESECTION Right 09/25/2015   Procedure: SHOULDER ARTHROSCOPY WITH DISTAL CLAVICLE RESECTION;  Surgeon: Loreta Ave, MD;  Location: [facility];  Service: Orthopedics;  Laterality: Right;   SHOULDER ARTHROSCOPY WITH SUBACROMIAL DECOMPRESSION Right 09/25/2015   Procedure: RIGHT SHOULDER ARTHROSCOPY DEBRIDEMENT,ACROMIOPLASTY,DISTAL CLAVICAL EXCISION, RELEASE OF BICEPS TENDON;  Surgeon: Loreta Ave, MD;  Location: [facility];  Service: Orthopedics;  Laterality: Right;   TMJ ARTHROPLASTY     TONSILLECTOMY     TOTAL SHOULDER ARTHROPLASTY Left 01/05/2016   UTERINE FIBROID SURGERY     polups and fibroids removed    WRIST SURGERY     left    MEDICATIONS:  albuterol (VENTOLIN HFA) 108 (90 Base) MCG/ACT inhaler   Alcohol Swabs (B-D SINGLE USE SWABS REGULAR) PADS   ARIPiprazole (ABILIFY) 5 MG tablet   aspirin EC 81 MG tablet   atenolol (TENORMIN) 50 MG tablet   Biotin 16109 MCG TABS   CALCIUM CARBONATE-VITAMIN D PO   cetirizine (ZYRTEC) 10 MG tablet   diclofenac sodium (VOLTAREN) 1 % GEL   Evolocumab  (REPATHA SURECLICK) 140 MG/ML SOAJ   fluticasone (CUTIVATE) 0.05 % cream   fluticasone (FLONASE) 50 MCG/ACT nasal spray   Fluticasone-Salmeterol 113-14 MCG/ACT AEPB   HYDROcodone-acetaminophen (NORCO/VICODIN) 5-325 MG tablet   L-Methylfolate 15 MG TABS   lamoTRIgine (LAMICTAL) 200 MG tablet   LUTEIN PO   Magnesium 500 MG TABS   meclizine (ANTIVERT) 25 MG tablet   montelukast (SINGULAIR) 10 MG tablet   pantoprazole (PROTONIX) 40 MG tablet   pramipexole (MIRAPEX) 0.125 MG tablet   traZODone (DESYREL) 100 MG tablet   triamterene-hydrochlorothiazide (MAXZIDE-25) 37.5-25 MG tablet   venlafaxine XR (EFFEXOR-XR) 150 MG 24 hr capsule   No current facility-administered medications for this encounter.     Jodell Cipro Ward, PA-C [facility] [phone]

## 2022-12-29 MED ORDER — DEXTROSE 5 % IV SOLN
5.0000 mg/kg | INTRAVENOUS | Status: DC
  Filled 2022-12-29: qty 8

## 2022-12-30 ENCOUNTER — Encounter (HOSPITAL_COMMUNITY)
Admission: RE | Disposition: A | Discharge status: Home / Self Care | Payer: Self-pay | Source: Ambulatory Visit | Attending: Orthopedic Surgery

## 2022-12-30 ENCOUNTER — Other Ambulatory Visit: Payer: Self-pay

## 2022-12-30 ENCOUNTER — Encounter (HOSPITAL_COMMUNITY): Payer: Self-pay | Admitting: Orthopedic Surgery

## 2022-12-30 ENCOUNTER — Ambulatory Visit (HOSPITAL_COMMUNITY): Payer: Medicare HMO | Admitting: Physician Assistant

## 2022-12-30 ENCOUNTER — Ambulatory Visit (HOSPITAL_COMMUNITY)
Admission: RE | Admit: 2022-12-30 | Discharge: 2022-12-30 | Disposition: A | Discharge status: Home / Self Care | Payer: Medicare HMO | Source: Ambulatory Visit | Attending: Orthopedic Surgery | Admitting: Orthopedic Surgery

## 2022-12-30 ENCOUNTER — Ambulatory Visit (HOSPITAL_COMMUNITY): Payer: Self-pay | Admitting: Anesthesiology

## 2022-12-30 DIAGNOSIS — Z6834 Body mass index (BMI) 34.0-34.9, adult: Secondary | ICD-10-CM | POA: Diagnosis not present

## 2022-12-30 DIAGNOSIS — S42251A Displaced fracture of greater tuberosity of right humerus, initial encounter for closed fracture: Secondary | ICD-10-CM | POA: Insufficient documentation

## 2022-12-30 DIAGNOSIS — F419 Anxiety disorder, unspecified: Secondary | ICD-10-CM | POA: Insufficient documentation

## 2022-12-30 DIAGNOSIS — J4489 Other specified chronic obstructive pulmonary disease: Secondary | ICD-10-CM | POA: Diagnosis not present

## 2022-12-30 DIAGNOSIS — M12811 Other specific arthropathies, not elsewhere classified, right shoulder: Secondary | ICD-10-CM | POA: Diagnosis not present

## 2022-12-30 DIAGNOSIS — M797 Fibromyalgia: Secondary | ICD-10-CM | POA: Diagnosis not present

## 2022-12-30 DIAGNOSIS — Z87891 Personal history of nicotine dependence: Secondary | ICD-10-CM | POA: Diagnosis not present

## 2022-12-30 DIAGNOSIS — I4891 Unspecified atrial fibrillation: Secondary | ICD-10-CM | POA: Diagnosis not present

## 2022-12-30 DIAGNOSIS — M19011 Primary osteoarthritis, right shoulder: Secondary | ICD-10-CM | POA: Insufficient documentation

## 2022-12-30 DIAGNOSIS — Z79899 Other long term (current) drug therapy: Secondary | ICD-10-CM | POA: Diagnosis not present

## 2022-12-30 DIAGNOSIS — X58XXXA Exposure to other specified factors, initial encounter: Secondary | ICD-10-CM | POA: Diagnosis not present

## 2022-12-30 DIAGNOSIS — S46011A Strain of muscle(s) and tendon(s) of the rotator cuff of right shoulder, initial encounter: Secondary | ICD-10-CM | POA: Diagnosis not present

## 2022-12-30 DIAGNOSIS — I10 Essential (primary) hypertension: Secondary | ICD-10-CM | POA: Diagnosis not present

## 2022-12-30 DIAGNOSIS — K219 Gastro-esophageal reflux disease without esophagitis: Secondary | ICD-10-CM | POA: Insufficient documentation

## 2022-12-30 DIAGNOSIS — F319 Bipolar disorder, unspecified: Secondary | ICD-10-CM | POA: Insufficient documentation

## 2022-12-30 HISTORY — PX: REVERSE SHOULDER ARTHROPLASTY: SHX5054

## 2022-12-30 SURGERY — ARTHROPLASTY, SHOULDER, TOTAL, REVERSE
Anesthesia: General | Site: Shoulder | Laterality: Right

## 2022-12-30 MED ORDER — CEFAZOLIN SODIUM-DEXTROSE 2-4 GM/100ML-% IV SOLN
2.0000 g | Freq: Once | INTRAVENOUS | Status: AC
  Administered 2022-12-30: 2 g via INTRAVENOUS
  Filled 2022-12-30: qty 100

## 2022-12-30 MED ORDER — PHENYLEPHRINE HCL (PRESSORS) 10 MG/ML IV SOLN
INTRAVENOUS | Status: AC
  Filled 2022-12-30: qty 1

## 2022-12-30 MED ORDER — SUGAMMADEX SODIUM 200 MG/2ML IV SOLN
INTRAVENOUS | Status: DC | PRN
  Administered 2022-12-30: 200 mg via INTRAVENOUS

## 2022-12-30 MED ORDER — OXYCODONE HCL 5 MG PO TABS
5.0000 mg | ORAL_TABLET | Freq: Once | ORAL | Status: AC
  Administered 2022-12-30: 5 mg via ORAL

## 2022-12-30 MED ORDER — PHENYLEPHRINE HCL (PRESSORS) 10 MG/ML IV SOLN
INTRAVENOUS | Status: AC
Start: 2022-12-30 — End: ?
  Filled 2022-12-30: qty 1

## 2022-12-30 MED ORDER — PROPOFOL 10 MG/ML IV BOLUS
INTRAVENOUS | Status: AC
Start: 2022-12-30 — End: ?
  Filled 2022-12-30: qty 20

## 2022-12-30 MED ORDER — FENTANYL CITRATE (PF) 100 MCG/2ML IJ SOLN
INTRAMUSCULAR | Status: AC
  Filled 2022-12-30: qty 2

## 2022-12-30 MED ORDER — ONDANSETRON HCL 4 MG/2ML IJ SOLN: 4.0000 mg | Freq: Four times a day (QID) | INTRAMUSCULAR | Status: DC | PRN

## 2022-12-30 MED ORDER — FENTANYL CITRATE PF 50 MCG/ML IJ SOSY: 25.0000 ug | PREFILLED_SYRINGE | INTRAMUSCULAR | Status: DC | PRN

## 2022-12-30 MED ORDER — TRANEXAMIC ACID-NACL 1000-0.7 MG/100ML-% IV SOLN
1000.0000 mg | INTRAVENOUS | Status: AC
  Administered 2022-12-30: 1000 mg via INTRAVENOUS
  Filled 2022-12-30: qty 100

## 2022-12-30 MED ORDER — BUPIVACAINE LIPOSOME 1.3 % IJ SUSP
INTRAMUSCULAR | Status: DC | PRN
  Administered 2022-12-30: 10 mL via PERINEURAL

## 2022-12-30 MED ORDER — DEXAMETHASONE SODIUM PHOSPHATE 10 MG/ML IJ SOLN
INTRAMUSCULAR | Status: AC
  Filled 2022-12-30: qty 1

## 2022-12-30 MED ORDER — ONDANSETRON HCL 4 MG PO TABS: 4.0000 mg | ORAL_TABLET | Freq: Four times a day (QID) | ORAL | Status: DC | PRN

## 2022-12-30 MED ORDER — ONDANSETRON HCL 4 MG/2ML IJ SOLN
INTRAMUSCULAR | Status: DC | PRN
  Administered 2022-12-30: 4 mg via INTRAVENOUS

## 2022-12-30 MED ORDER — VANCOMYCIN HCL IN DEXTROSE 1-5 GM/200ML-% IV SOLN: 1000.0000 mg | INTRAVENOUS | Status: DC

## 2022-12-30 MED ORDER — CHLORHEXIDINE GLUCONATE 0.12 % MT SOLN
15.0000 mL | Freq: Once | OROMUCOSAL | Status: AC
  Administered 2022-12-30: 15 mL via OROMUCOSAL

## 2022-12-30 MED ORDER — MIDAZOLAM HCL 5 MG/5ML IJ SOLN
INTRAMUSCULAR | Status: DC | PRN
  Administered 2022-12-30: 2 mg via INTRAVENOUS

## 2022-12-30 MED ORDER — BUPIVACAINE HCL (PF) 0.5 % IJ SOLN
INTRAMUSCULAR | Status: DC | PRN
  Administered 2022-12-30: 10 mL via PERINEURAL

## 2022-12-30 MED ORDER — LACTATED RINGERS IV SOLN
INTRAVENOUS | Status: DC
Start: 2022-12-30 — End: 2022-12-30

## 2022-12-30 MED ORDER — FENTANYL CITRATE (PF) 100 MCG/2ML IJ SOLN
INTRAMUSCULAR | Status: DC | PRN
  Administered 2022-12-30 (×2): 50 ug via INTRAVENOUS

## 2022-12-30 MED ORDER — ONDANSETRON HCL 4 MG/2ML IJ SOLN
INTRAMUSCULAR | Status: AC
  Filled 2022-12-30: qty 2

## 2022-12-30 MED ORDER — OXYCODONE HCL 5 MG PO TABS
ORAL_TABLET | ORAL | Status: AC
  Filled 2022-12-30: qty 1

## 2022-12-30 MED ORDER — LIDOCAINE HCL (CARDIAC) PF 100 MG/5ML IV SOSY
PREFILLED_SYRINGE | INTRAVENOUS | Status: DC | PRN
  Administered 2022-12-30: 80 mg via INTRAVENOUS

## 2022-12-30 MED ORDER — AMISULPRIDE (ANTIEMETIC) 5 MG/2ML IV SOLN: 10.0000 mg | Freq: Once | INTRAVENOUS | Status: DC | PRN

## 2022-12-30 MED ORDER — WATER FOR IRRIGATION, STERILE IR SOLN
Status: DC | PRN
  Administered 2022-12-30: 1000 mL

## 2022-12-30 MED ORDER — TIZANIDINE HCL 4 MG PO TABS: 2.0000 mg | ORAL_TABLET | Freq: Three times a day (TID) | ORAL | 0 refills | Status: DC | PRN

## 2022-12-30 MED ORDER — ROCURONIUM BROMIDE 100 MG/10ML IV SOLN
INTRAVENOUS | Status: DC | PRN
  Administered 2022-12-30: 50 mg via INTRAVENOUS

## 2022-12-30 MED ORDER — ROCURONIUM BROMIDE 10 MG/ML (PF) SYRINGE
PREFILLED_SYRINGE | INTRAVENOUS | Status: AC
  Filled 2022-12-30: qty 10

## 2022-12-30 MED ORDER — MIDAZOLAM HCL 2 MG/2ML IJ SOLN
INTRAMUSCULAR | Status: AC
Start: 2022-12-30 — End: ?
  Filled 2022-12-30: qty 2

## 2022-12-30 MED ORDER — ACETAMINOPHEN 500 MG PO TABS
1000.0000 mg | ORAL_TABLET | Freq: Once | ORAL | Status: AC
  Administered 2022-12-30: 1000 mg via ORAL
  Filled 2022-12-30: qty 2

## 2022-12-30 MED ORDER — PHENYLEPHRINE HCL-NACL 20-0.9 MG/250ML-% IV SOLN
INTRAVENOUS | Status: DC | PRN
  Administered 2022-12-30: 20 ug/min via INTRAVENOUS

## 2022-12-30 MED ORDER — PHENYLEPHRINE 80 MCG/ML (10ML) SYRINGE FOR IV PUSH (FOR BLOOD PRESSURE SUPPORT)
PREFILLED_SYRINGE | INTRAVENOUS | Status: AC
Start: 2022-12-30 — End: ?
  Filled 2022-12-30: qty 10

## 2022-12-30 MED ORDER — OXYCODONE HCL 5 MG PO TABS: 5.0000 mg | ORAL_TABLET | ORAL | 0 refills | Status: DC | PRN

## 2022-12-30 MED ORDER — PROPOFOL 10 MG/ML IV BOLUS
INTRAVENOUS | Status: DC | PRN
  Administered 2022-12-30: 140 mg via INTRAVENOUS

## 2022-12-30 MED ORDER — LIDOCAINE HCL (PF) 2 % IJ SOLN
INTRAMUSCULAR | Status: AC
Start: 2022-12-30 — End: ?
  Filled 2022-12-30: qty 5

## 2022-12-30 MED ORDER — SODIUM CHLORIDE 0.9 % IR SOLN
Status: DC | PRN
  Administered 2022-12-30: 1000 mL

## 2022-12-30 MED ORDER — ORAL CARE MOUTH RINSE: 15.0000 mL | Freq: Once | OROMUCOSAL | Status: AC

## 2022-12-30 MED ORDER — ONDANSETRON HCL 4 MG/2ML IJ SOLN: 4.0000 mg | Freq: Once | INTRAMUSCULAR | Status: DC | PRN

## 2022-12-30 MED ORDER — PHENYLEPHRINE 80 MCG/ML (10ML) SYRINGE FOR IV PUSH (FOR BLOOD PRESSURE SUPPORT)
PREFILLED_SYRINGE | INTRAVENOUS | Status: DC | PRN
  Administered 2022-12-30 (×3): 160 ug via INTRAVENOUS

## 2022-12-30 SURGICAL SUPPLY — 70 items
BAG COUNTER SPONGE SURGICOUNT (BAG) IMPLANT
BAG ZIPLOCK 12X15 (MISCELLANEOUS) ×1 IMPLANT
BASEPLATE P2 COATD GLND 6.5X30 (Shoulder) IMPLANT
BIT DRILL 1.6MX128 (BIT) IMPLANT
BIT DRILL 2.5 DIA 127 CALI (BIT) IMPLANT
BIT DRILL 4 DIA CALIBRATED (BIT) IMPLANT
BLADE SAW SGTL 73X25 THK (BLADE) ×1 IMPLANT
BOOTIES KNEE HIGH SLOAN (MISCELLANEOUS) ×2 IMPLANT
COOLER ICEMAN CLASSIC (MISCELLANEOUS) ×1 IMPLANT
COVER BACK TABLE 60X90IN (DRAPES) ×1 IMPLANT
COVER SURGICAL LIGHT HANDLE (MISCELLANEOUS) ×1 IMPLANT
DRAPE INCISE IOBAN 66X45 STRL (DRAPES) ×1 IMPLANT
DRAPE POUCH INSTRU U-SHP 10X18 (DRAPES) ×1 IMPLANT
DRAPE SHEET LG 3/4 BI-LAMINATE (DRAPES) ×1 IMPLANT
DRAPE SURG 17X11 SM STRL (DRAPES) ×1 IMPLANT
DRAPE SURG ORHT 6 SPLT 77X108 (DRAPES) ×2 IMPLANT
DRAPE TOP 10253 STERILE (DRAPES) ×1 IMPLANT
DRAPE U-SHAPE 47X51 STRL (DRAPES) ×1 IMPLANT
DRSG AQUACEL AG ADV 3.5X 6 (GAUZE/BANDAGES/DRESSINGS) ×1 IMPLANT
DURAPREP 26ML APPLICATOR (WOUND CARE) ×2 IMPLANT
ELECT BLADE TIP CTD 4 INCH (ELECTRODE) ×1 IMPLANT
ELECT REM PT RETURN 15FT ADLT (MISCELLANEOUS) ×1 IMPLANT
FACESHIELD WRAPAROUND (MASK) ×1 IMPLANT
FACESHIELD WRAPAROUND OR TEAM (MASK) ×1 IMPLANT
GLOVE BIO SURGEON STRL SZ7.5 (GLOVE) ×1 IMPLANT
GLOVE BIOGEL PI IND STRL 6.5 (GLOVE) ×1 IMPLANT
GLOVE BIOGEL PI IND STRL 8 (GLOVE) ×1 IMPLANT
GLOVE SURG SS PI 6.5 STRL IVOR (GLOVE) ×1 IMPLANT
GOWN STRL REUS W/ TWL LRG LVL3 (GOWN DISPOSABLE) ×1 IMPLANT
GOWN STRL REUS W/ TWL XL LVL3 (GOWN DISPOSABLE) ×1 IMPLANT
HOOD PEEL AWAY T7 (MISCELLANEOUS) ×3 IMPLANT
HUMERA STEM SM SHELL SHOU 10 (Miscellaneous) ×1 IMPLANT
INSERT SMALL SOCKET 32MM NEU (Insert) IMPLANT
KIT BASIN OR (CUSTOM PROCEDURE TRAY) ×1 IMPLANT
KIT TURNOVER KIT A (KITS) IMPLANT
MANIFOLD NEPTUNE II (INSTRUMENTS) ×1 IMPLANT
NDL MA TROC 1/2 CIR (NEEDLE) IMPLANT
NDL TROCAR POINT SZ 2 1/2 (NEEDLE) IMPLANT
NEEDLE MA TROC 1/2 CIR (NEEDLE) ×1 IMPLANT
NEEDLE TROCAR POINT SZ 2 1/2 (NEEDLE) IMPLANT
NS IRRIG 1000ML POUR BTL (IV SOLUTION) ×1 IMPLANT
P2 COATDE GLNOID BSEPLT 6.5X30 (Shoulder) ×1 IMPLANT
PACK SHOULDER (CUSTOM PROCEDURE TRAY) ×1 IMPLANT
PAD COLD SHLDR WRAP-ON (PAD) ×1 IMPLANT
PROTECTOR NERVE ULNAR (MISCELLANEOUS) IMPLANT
RESTRAINT HEAD UNIVERSAL NS (MISCELLANEOUS) ×1 IMPLANT
RETRIEVER SUT HEWSON (MISCELLANEOUS) IMPLANT
SCREW BONE LOCKING RSP 5.0X14 (Screw) ×2 IMPLANT
SCREW BONE RSP LOCK 5X14 (Screw) IMPLANT
SCREW BONE RSP LOCK 5X26 (Screw) IMPLANT
SCREW BONE RSP LOCKING 5.0X26 (Screw) ×2 IMPLANT
SCREW RETAIN W/HEAD 4MM OFFSET (Shoulder) IMPLANT
SET HNDPC FAN SPRY TIP SCT (DISPOSABLE) ×1 IMPLANT
SLING ARM IMMOBILIZER LRG (SOFTGOODS) IMPLANT
SLING ARM IMMOBILIZER MED (SOFTGOODS) IMPLANT
STEM HUMERAL SM SHELL SHOU 10 (Miscellaneous) IMPLANT
STRIP CLOSURE SKIN 1/2X4 (GAUZE/BANDAGES/DRESSINGS) ×1 IMPLANT
SUCTION TUBE FRAZIER 10FR DISP (SUCTIONS) IMPLANT
SUPPORT WRAP ARM LG (MISCELLANEOUS) ×1 IMPLANT
SUT ETHIBOND 2 V 37 (SUTURE) IMPLANT
SUT FIBERWIRE #2 38 REV NDL BL (SUTURE)
SUT MNCRL AB 4-0 PS2 18 (SUTURE) ×1 IMPLANT
SUT VIC AB 2-0 CT1 TAPERPNT 27 (SUTURE) ×2 IMPLANT
SUTURE FIBERWR#2 38 REV NDL BL (SUTURE) IMPLANT
TAP SURG THRD DJ 6.5 (ORTHOPEDIC DISPOSABLE SUPPLIES) IMPLANT
TAPE LABRALWHITE 1.5X36 (TAPE) IMPLANT
TAPE SUT LABRALTAP WHT/BLK (SUTURE) IMPLANT
TOWEL OR 17X26 10 PK STRL BLUE (TOWEL DISPOSABLE) ×1 IMPLANT
TOWEL OR NON WOVEN STRL DISP B (DISPOSABLE) ×1 IMPLANT
WATER STERILE IRR 1000ML POUR (IV SOLUTION) ×1 IMPLANT

## 2022-12-30 NOTE — Anesthesia Procedure Notes (Signed)
Anesthesia Regional Block: Interscalene brachial plexus block   Pre-Anesthetic Checklist: , timeout performed,  Correct Patient, Correct Site, Correct Laterality,  Correct Procedure, Correct Position, site marked,  Risks and benefits discussed,  Surgical consent,  Pre-op evaluation,  At surgeon's request and post-op pain management  Laterality: Right  Prep: chloraprep       Needles:  Injection technique: Single-shot  Needle Type: Echogenic Needle     Needle Length: 9cm  Needle Gauge: 21     Additional Needles:   Procedures:,,,, ultrasound used (permanent image in chart),,    Narrative:  Start time: 12/30/2022 6:56 AM End time: 12/30/2022 7:06 AM Injection made incrementally with aspirations every 5 mL.  Performed by: Personally  Anesthesiologist: Collene Schlichter, MD  Additional Notes: No pain on injection. No increased resistance to injection. Injection made in 5cc increments.  Good needle visualization.  Patient tolerated procedure well.

## 2022-12-30 NOTE — Anesthesia Procedure Notes (Signed)
Procedure Name: Intubation Date/Time: 12/30/2022 7:31 AM  Performed by: Sampson Goon, CRNAPre-anesthesia Checklist: Patient identified, Emergency Drugs available, Suction available and Patient being monitored Patient Re-evaluated:Patient Re-evaluated prior to induction Oxygen Delivery Method: Circle System Utilized Preoxygenation: Pre-oxygenation with 100% oxygen Induction Type: IV induction Ventilation: Mask ventilation with difficulty, Oral airway inserted - appropriate to patient size and Two handed mask ventilation required Laryngoscope Size: Mac and 3 Grade View: Grade IV Tube type: Oral Tube size: 7.0 mm Number of attempts: 1 Airway Equipment and Method: Stylet and Oral airway Placement Confirmation: ETT inserted through vocal cords under direct vision, positive ETCO2 and breath sounds checked- equal and bilateral Secured at: 22 cm Tube secured with: Tape Dental Injury: Teeth and Oropharynx as per pre-operative assessment  Difficulty Due To: Difficult Airway- due to reduced neck mobility and Difficult Airway- due to limited oral opening

## 2022-12-30 NOTE — Op Note (Signed)
Procedure(s): REVERSE SHOULDER ARTHROPLASTY Procedure Note  Anita Arroyo female 62 y.o. 12/30/2022   Preoperative diagnosis: Right shoulder advanced arthritis with rotator cuff disease and greater tuberosity fracture  Postoperative diagnosis: Same  Procedure(s) and Anesthesia Type:    * REVERSE SHOULDER ARTHROPLASTY - Choice   Indications:  62 y.o. female  With advanced arthritis in the right shoulder who suffered a injury with tuberosity fracture and associated rotator cuff injury.  She did not do well with conservative management.  Ultimately she was indicated for reverse shoulder replacement to address both her rotator cuff issues and the underlying advanced arthritis.  Surgeon: Glennon Hamilton   Assistants: Fredia Sorrow PA-C Amber was present and scrubbed throughout the procedure and was essential in positioning, retraction, exposure, and closure)  Anesthesia: General endotracheal anesthesia with preoperative interscalene block given by the attending anesthesiologist    Procedure Detail  REVERSE SHOULDER ARTHROPLASTY   Estimated Blood Loss:  200 mL         Drains: none  Blood Given: none          Specimens: none        Complications:  * No complications entered in OR log *         Disposition: PACU - hemodynamically stable.         Condition: stable      OPERATIVE FINDINGS:  A DJO Altivate pressfit reverse total shoulder arthroplasty was placed with a  size 10 stem, a 32-4 glenosphere, and a standard-mm poly insert. The base plate  fixation was excellent.  PROCEDURE: The patient was identified in the preoperative holding area  where I personally marked the operative site after verifying site, side,  and procedure with the patient. An interscalene block given by  the attending anesthesiologist in the holding area and the patient was taken back to the operating room where all extremities were  carefully padded in position after general anesthesia was  induced. She  was placed in a beach-chair position and the operative upper extremity was  prepped and draped in a standard sterile fashion. An approximately 10-  cm incision was made from the tip of the coracoid process to the center  point of the humerus at the level of the axilla. Dissection was carried  down through subcutaneous tissues to the level of the cephalic vein  which was taken laterally with the deltoid. The pectoralis major was  retracted medially. The subdeltoid space was developed and the lateral  edge of the conjoined tendon was identified. The undersurface of  conjoined tendon was palpated and the musculocutaneous nerve was not in  the field. Retractor was placed underneath the conjoined and second  retractor was placed lateral into the deltoid. The circumflex humeral  artery and vessels were identified and clamped and coagulated. The  biceps tendon was absent.  The subscapularis was taken down as a peel.  The  joint was then gently externally rotated while the capsule was released  from the humeral neck around to just beyond the 6 o'clock position. At  this point, the joint was dislocated and the humeral head was presented  into the wound. The excessive osteophyte formation was removed with a  large rongeur.  The cutting guide was used to make the appropriate  head cut and the head was saved for potentially bone grafting.  The glenoid was exposed with the arm in an  abducted extended position. The anterior and posterior labrum were  completely excised and the capsule was  released circumferentially to  allow for exposure of the glenoid for preparation. The 2.5 mm drill was  placed using the guide in 5-10 inferior angulation and the tap was then advanced in the same hole. Small and large reamers were then used. The tap was then removed and the Metaglene was then screwed in with excellent purchase.  The peripheral guide was then used to drilled measured and filled peripheral  locking screws. The size 32-4 glenosphere was then impacted on the [facility] taper and the central screw was placed. The humerus was then again exposed and the diaphyseal reamers were used followed by the metaphyseal reamers. The final broach was left in place in the proximal trial was placed. The joint was reduced and with this implant it was felt that soft tissue tensioning was appropriate with excellent stability and excellent range of motion. Therefore, final humeral stem was placed press-fit.  And then the trial polyethylene inserts were tested again and the above implant was felt to be the most appropriate for final insertion. The joint was reduced taken through full range of motion and felt to be stable. Soft tissue tension was appropriate.  The joint was then copiously irrigated with pulse  lavage and the wound was then closed. The subscapularis was repaired with 2 labral tapes through drill tunnels around the implant.  Skin was closed with 2-0 Vicryl in a deep dermal layer and 4-0  Monocryl for skin closure. Steri-Strips were applied. Sterile  dressings were then applied as well as a sling. The patient was allowed  to awaken from general anesthesia, transferred to stretcher, and taken  to recovery room in stable condition.   POSTOPERATIVE PLAN: The patient will be observed in the recovery room and if her pain is well-controlled with regional anesthesia and she is hemodynamically stable she can be discharged home today with family.

## 2022-12-30 NOTE — Evaluation (Signed)
Occupational Therapy Evaluation Patient Details Name: Anita Arroyo MRN: [id number] DOB: Mar 29, 1960 Today's Date: 12/30/2022   History of Present Illness 62 yo  s/p TSA.  PMH: Anxiety, Depression, GERD, Hyperlipidemia.   Clinical Impression   Patient admitted for the procedure above.  PTA she lives alone, but will be staying with her mother for 2 days prior to transitioning back to home.  Patient with R UE block in place, all education reviewed and documented with good understanding.  Patient needing expected assist for ADL completion.  No further OT needs in the acute setting, recommend follow up as prescribed by MD.         If plan is discharge home, recommend the following: Assist for transportation    Functional Status Assessment  Patient has had a recent decline in their functional status and demonstrates the ability to make significant improvements in function in a reasonable and predictable amount of time.  Equipment Recommendations  None recommended by OT    Recommendations for Other Services       Precautions / Restrictions Precautions Precautions: Shoulder Type of Shoulder Precautions: No A/PROM at shoulder. Shoulder Interventions: Off for dressing/bathing/exercises;Shoulder sling/immobilizer Precaution Booklet Issued: Yes (comment) Required Braces or Orthoses: Sling Restrictions Weight Bearing Restrictions: Yes RUE Weight Bearing: Non weight bearing      Mobility Bed Mobility                    Transfers Overall transfer level: Needs assistance   Transfers: Sit to/from Stand Sit to Stand: Supervision                  Balance Overall balance assessment: Needs assistance Sitting-balance support: Feet supported Sitting balance-Leahy Scale: Good     Standing balance support: No upper extremity supported Standing balance-Leahy Scale: Fair                             ADL either performed or assessed with clinical judgement   ADL                    Upper Body Dressing : Moderate assistance   Lower Body Dressing: Moderate assistance   Toilet Transfer: Supervision/safety                   Vision Baseline Vision/History: 1 Wears glasses Patient Visual Report: No change from baseline       Perception Perception: Not tested       Praxis Praxis: Not tested       Pertinent Vitals/Pain Pain Assessment Pain Assessment: Faces Faces Pain Scale: Hurts little more Pain Location: R shoulder Pain Descriptors / Indicators: Burning Pain Intervention(s): Monitored during session     Extremity/Trunk Assessment Upper Extremity Assessment Upper Extremity Assessment: LUE deficits/detail LUE Deficits / Details: L block in place LUE Sensation: decreased light touch LUE Coordination: decreased fine motor;decreased gross motor   Lower Extremity Assessment Lower Extremity Assessment: Overall WFL for tasks assessed   Cervical / Trunk Assessment Cervical / Trunk Assessment: Kyphotic   Communication     Cognition Arousal: Alert Behavior During Therapy: WFL for tasks assessed/performed Overall Cognitive Status: Within Functional Limits for tasks assessed                                       General Comments   VSS on RA  Exercises     Shoulder Instructions      Home Living Family/patient expects to be discharged to:: Private residence Living Arrangements: Alone Available Help at Discharge: Family;Available 24 hours/day Type of Home: House Home Access: Stairs to enter [employer] of Steps: 3 Entrance Stairs-Rails: Left Home Layout: One level     Bathroom Shower/Tub: Producer, television/film/video: Standard Bathroom Accessibility: Yes How Accessible: Accessible via walker Home Equipment: Shower seat          Prior Functioning/Environment Prior Level of Function : Independent/Modified Independent;Driving             Mobility Comments: No AD ADLs  Comments: Ind with ADL, iADL.  Works PT as Public house manager Problem List: Decreased range of motion;Impaired sensation;Pain      OT Treatment/Interventions:      OT Goals(Current goals can be found in the care plan section) Acute Rehab OT Goals Patient Stated Goal: Return home OT Goal Formulation: With patient Time For Goal Achievement: 01/03/23 Potential to Achieve Goals: Good  OT Frequency:      Co-evaluation              AM-PAC OT "6 Clicks" Daily Activity     Outcome Measure Help from another person eating meals?: None Help from another person taking care of personal grooming?: A Little Help from another person toileting, which includes using toliet, bedpan, or urinal?: A Little Help from another person bathing (including washing, rinsing, drying)?: A Lot Help from another person to put on and taking off regular upper body clothing?: A Lot Help from another person to put on and taking off regular lower body clothing?: A Lot 6 Click Score: 16   End of Session Nurse Communication: Mobility status  Activity Tolerance: Patient tolerated treatment well Patient left: in chair;with call bell/phone within reach  OT Visit Diagnosis: Pain Pain - Right/Left: Right Pain - part of body: Shoulder                Time: 4098-1191 OT Time Calculation (min): 27 min Charges:  OT General Charges $OT Visit: 1 Visit OT Evaluation $OT Eval Moderate Complexity: 1 Mod OT Treatments $Self Care/Home Management : 8-22 mins  12/30/2022  RP, OTR/L  Acute Rehabilitation Services  Office:  [phone]   Suzanna Obey 12/30/2022, 10:23 AM

## 2022-12-30 NOTE — H&P (Signed)
Anita Arroyo is an 62 y.o. female.   Chief Complaint: R shoulder pain and dysfunction HPI: s/p R shoulder fracture with rotator cuff injury and underlying osteoarthritis.  Failed conservative tx. Pain interferes with sleep and quality of life.  Past Medical History:  Diagnosis Date   Allergy    pollen and environmental   Anxiety    Arthritis    Asthma    Bipolar 1 disorder (HCC)    Bipolar 1 disorder (HCC)    Breast cancer (HCC)    Cataract    COPD (chronic obstructive pulmonary disease) (HCC)    Depression    Dysrhythmia    palpitations   Dysrhythmia    atrial fibrillation   Fibromyalgia    Finger fracture 2011   GERD (gastroesophageal reflux disease)    Heart murmur    History of stress test 04/28/2011   showed normal perfusion without scar or ischemia   Hx of echocardiogram 04/28/2011   showed normal systolic function with mild diastolic dysfunction,she had trace MR but did not have frank mitral valve prolapse demonstrated. She had mild pulmonary hypertension with an estimated RV systolic pressure at 34 mm.   Hyperlipemia    Meniere disease    MVP (mitral valve prolapse)    Neuromuscular disorder (HCC)    fibromyalgia   Pre-diabetes     Past Surgical History:  Procedure Laterality Date   ANKLE SURGERY     left x4   BREAST LUMPECTOMY Right 2011   BREAST SURGERY  2011   lumpectomy right breast    COLONOSCOPY     COLONOSCOPY WITH PROPOFOL N/A 02/25/2014   Procedure: COLONOSCOPY WITH PROPOFOL;  Surgeon: Charolett Bumpers, MD;  Location: [facility];  Service: Endoscopy;  Laterality: N/A;   deviated septum     ECTOPIC PREGNANCY SURGERY     ELBOW SURGERY     right elbow   EYE SURGERY Bilateral    cataract surgery   fibroid     2-3 fibroid adenomas removed   HAND SURGERY Left 2020   fall from Dog, pinkie finger, ring finger, middle finger   JOINT REPLACEMENT  2011   right knee    KNEE SURGERY     left knee   POLYPECTOMY     SHOULDER ARTHROSCOPY WITH  BICEPSTENOTOMY Right 09/25/2015   Procedure: SHOULDER ARTHROSCOPY WITH BICEPSTENOTOMY;  Surgeon: Loreta Ave, MD;  Location: [facility];  Service: Orthopedics;  Laterality: Right;   SHOULDER ARTHROSCOPY WITH DISTAL CLAVICLE RESECTION Right 09/25/2015   Procedure: SHOULDER ARTHROSCOPY WITH DISTAL CLAVICLE RESECTION;  Surgeon: Loreta Ave, MD;  Location: [facility];  Service: Orthopedics;  Laterality: Right;   SHOULDER ARTHROSCOPY WITH SUBACROMIAL DECOMPRESSION Right 09/25/2015   Procedure: RIGHT SHOULDER ARTHROSCOPY DEBRIDEMENT,ACROMIOPLASTY,DISTAL CLAVICAL EXCISION, RELEASE OF BICEPS TENDON;  Surgeon: Loreta Ave, MD;  Location: [facility];  Service: Orthopedics;  Laterality: Right;   TMJ ARTHROPLASTY     TONSILLECTOMY     TOTAL SHOULDER ARTHROPLASTY Left 01/05/2016   UTERINE FIBROID SURGERY     polups and fibroids removed    WRIST SURGERY     left    Family History  Problem Relation Age of Onset   Breast cancer Mother        biological mother   Mitral valve prolapse Mother    Heart Problems Mother    Cancer Mother    Colon cancer Maternal Grandfather    Esophageal cancer Neg Hx    Rectal  cancer Neg Hx    Stomach cancer Neg Hx    Social History:  reports that she quit smoking about 47 years ago. Her smoking use included cigarettes. She started smoking about 48 years ago. She has a 2 pack-year smoking history. She has never used smokeless tobacco. She reports that she does not currently use alcohol. She reports that she does not use drugs.  Allergies:  Allergies  Allergen Reactions   Contrast Media [Iodinated Contrast Media] Anaphylaxis    Anaphylactic shock.   Celebrex [Celecoxib]     "I climb the walls"  Other Reaction(s): Other (See Comments)  "I climb the walls", "I climb the walls"   Statins Other (See Comments)    Body aches, constipation AND DEPRESSION  Body aches, constipation  Other Reaction(s): Other (See  Comments)  Body aches, constipation, Body aches, constipation AND DEPRESSION   Sulfa Antibiotics Nausea And Vomiting    Other Reaction(s): GI Intolerance   Zetia [Ezetimibe] Other (See Comments)    "I can't take any cholesterol medicines."  Other Reaction(s): Other (See Comments)   Augmentin [Amoxicillin-Pot Clavulanate] Rash and Other (See Comments)    Has patient had a PCN reaction causing immediate rash, facial/tongue/throat swelling, SOB or lightheadedness with hypotension: No Has patient had a PCN reaction causing severe rash involving mucus membranes or skin necrosis: No Has patient had a PCN reaction that required hospitalization No Has patient had a PCN reaction occurring within the last 10 years: No If all of the above answers are "NO", then may proceed with Cephalosporin use.   Ceclor [Cefaclor] Rash   Moxifloxacin Rash   Sulfonamide Derivatives Nausea And Vomiting    Medications Prior to Admission  Medication Sig Dispense Refill   albuterol (VENTOLIN HFA) 108 (90 Base) MCG/ACT inhaler Inhale 2 puffs into the lungs every 4 (four) hours as needed for wheezing or shortness of breath (cough, shortness of breath or wheezing.). 8 g 1   ARIPiprazole (ABILIFY) 5 MG tablet Take 1 tablet (5 mg total) by mouth daily. 90 tablet 1   aspirin EC 81 MG tablet Take 81 mg by mouth daily. Swallow whole.     atenolol (TENORMIN) 50 MG tablet Take 50 mg in the morning and 25 mg in the evening 135 tablet 1   Biotin 40981 MCG TABS Take 1 tablet by mouth daily.     CALCIUM CARBONATE-VITAMIN D PO Take 1 tablet by mouth daily.     cetirizine (ZYRTEC) 10 MG tablet Take 10 mg by mouth daily.     diclofenac sodium (VOLTAREN) 1 % GEL Apply 1 Application topically daily as needed (pain).     Evolocumab (REPATHA SURECLICK) 140 MG/ML SOAJ INJECT 140 MG into THE SKIN EVERY 14 DAYS 6 mL 3   fluticasone (CUTIVATE) 0.05 % cream Apply 1 Application topically 2 (two) times daily as needed (irritation).      fluticasone (FLONASE) 50 MCG/ACT nasal spray USE 1 SPRAY IN EACH NOSTRIL TWICE DAILY 48 g 1   Fluticasone-Salmeterol 113-14 MCG/ACT AEPB Inhale 1 PUFF into the lungs 2 TIMES DAILY 1 each 11   HYDROcodone-acetaminophen (NORCO/VICODIN) 5-325 MG tablet take 1 TABLET BY MOUTH EVERY 6 HOURS AS NEEDED for moderate pain 120 tablet 0   L-Methylfolate 15 MG TABS Take 1 tablet (15 mg total) by mouth daily. 90 tablet 1   lamoTRIgine (LAMICTAL) 200 MG tablet TAKE 1/2 TABLET EVERY MORNING  AND TAKE 2 TABLETS AT BEDTIME (Patient taking differently: Take 500 mg by mouth at bedtime.) 225  tablet 3   LUTEIN PO Take 2 mg by mouth daily.     Magnesium 500 MG TABS Take 500 mg by mouth daily.     montelukast (SINGULAIR) 10 MG tablet Take 1 tablet (10 mg total) by mouth at bedtime. 90 tablet 3   pantoprazole (PROTONIX) 40 MG tablet Take 1 tablet (40 mg total) by mouth daily. 90 tablet 1   pramipexole (MIRAPEX) 0.125 MG tablet Take 2 tablets (0.25 mg total) by mouth every evening. 180 tablet 3   traZODone (DESYREL) 100 MG tablet Take 2 tablets (200 mg total) by mouth at bedtime. 180 tablet 1   triamterene-hydrochlorothiazide (MAXZIDE-25) 37.5-25 MG tablet Take 0.5 tablets daily by mouth. 90 tablet 3   venlafaxine XR (EFFEXOR-XR) 150 MG 24 hr capsule TAKE 2 CAPSULES EVERY DAY WITH BREAKFAST 180 capsule 1   Alcohol Swabs (B-D SINGLE USE SWABS REGULAR) PADS      meclizine (ANTIVERT) 25 MG tablet Take 1 tablet (25 mg total) by mouth as needed for dizziness. 30 tablet 1    No results found for this or any previous visit (from the past 48 hour(s)). No results found.  Review of Systems  All other systems reviewed and are negative.   Blood pressure 120/62, pulse 68, temperature 98.8 F (37.1 C), temperature source Oral, resp. rate 16, weight 84.8 kg, last menstrual period 05/30/2011, SpO2 98%. Physical Exam HENT:     Head: Atraumatic.  Cardiovascular:     Pulses: Normal pulses.  Pulmonary:     Effort: Pulmonary  effort is normal.  Musculoskeletal:     Comments: R shoulder pain with limited ROM> NVID  Skin:    General: Skin is warm.  Neurological:     Mental Status: She is alert.      Assessment/Plan s/p R shoulder fracture with rotator cuff injury and underlying osteoarthritis.  Failed conservative tx. Pain interferes with sleep and quality of life. Plan R reverse TSA Risks / benefits of surgery discussed Consent on chart  NPO for OR Preop antibiotics   Glennon Hamilton, MD 12/30/2022, 6:55 AM

## 2022-12-30 NOTE — Anesthesia Postprocedure Evaluation (Signed)
Anesthesia Post Note  Patient: Anita Arroyo  Procedure(s) Performed: REVERSE SHOULDER ARTHROPLASTY (Right: Shoulder)     Patient location during evaluation: PACU Anesthesia Type: General Level of consciousness: awake and alert Pain management: pain level controlled Vital Signs Assessment: post-procedure vital signs reviewed and stable Respiratory status: spontaneous breathing, nonlabored ventilation and respiratory function stable Cardiovascular status: blood pressure returned to baseline and stable Postop Assessment: no apparent nausea or vomiting Anesthetic complications: no   No notable events documented.  Last Vitals:  Vitals:   12/30/22 0935 12/30/22 1021  BP: 128/62 127/80  Pulse: 65 68  Resp: 15 12  Temp: 37 C 36.7 C  SpO2: 93% 96%    Last Pain:  Vitals:   12/30/22 1021  TempSrc:   PainSc: 3                  Collene Schlichter

## 2022-12-30 NOTE — Discharge Instructions (Addendum)
Discharge Instructions after Reverse Total Shoulder Arthroplasty   . A sling has been provided for you. You are to wear this at all times (except for bathing and dressing), until your first post operative visit with Dr. Chandler. Please also wear while sleeping at night. While you bath and dress, let the arm/elbow extend straight down to stretch your elbow. Wiggle your fingers and pump your first while your in the sling to prevent hand swelling. . Use ice on the shoulder intermittently over the first 48 hours after surgery. Continue to use ice or and ice machine as needed after 48 hours for pain control/swelling.  . Pain medicine has been prescribed for you.  . Use your medicine liberally over the first 48 hours, and then you can begin to taper your use. You may take Extra Strength Tylenol or Tylenol only in place of the pain pills. DO NOT take ANY nonsteroidal anti-inflammatory pain medications: Advil, Motrin, Ibuprofen, Aleve, Naproxen or Naprosyn.  . Take one aspirin a day for 2 weeks after surgery, unless you have an aspirin sensitivity/allergy or asthma.  . Leave your dressing on until your first follow up visit.  You may shower with the dressing.  Hold your arm as if you still have your sling on while you shower. . Simply allow the water to wash over the site and then pat dry. Make sure your axilla (armpit) is completely dry after showering.    Please call [phone] during normal business hours or [phone] after hours for any problems. Including the following:  - excessive redness of the incisions - drainage for more than 4 days - fever of more than 101.5 F  *Please note that pain medications will not be refilled after hours or on weekends.    Dental Antibiotics:  In most cases prophylactic antibiotics for Dental procdeures after total joint surgery are not necessary.  Exceptions are as follows:  1. History of prior total joint infection  2. Severely immunocompromised  (Organ Transplant, cancer chemotherapy, Rheumatoid biologic meds such as Humera)  3. Poorly controlled diabetes (A1C &gt; 8.0, blood glucose over 200)  If you have one of these conditions, contact your surgeon for an antibiotic prescription, prior to your dental procedure. 

## 2022-12-30 NOTE — Transfer of Care (Signed)
Immediate Anesthesia Transfer of Care Note  Patient: Anita Arroyo  Procedure(s) Performed: REVERSE SHOULDER ARTHROPLASTY (Right: Shoulder)  Patient Location: PACU  Anesthesia Type:General  Level of Consciousness: awake  Airway & Oxygen Therapy: Patient Spontanous Breathing and Patient connected to face mask oxygen  Post-op Assessment: Report given to RN and Post -op Vital signs reviewed and stable  Post vital signs: Reviewed and stable  Last Vitals:  Vitals Value Taken Time  BP 136/67 12/30/22 0850  Temp    Pulse 69 12/30/22 0854  Resp 19 12/30/22 0854  SpO2 98 % 12/30/22 0854  Vitals shown include unfiled device data.  Last Pain:  Vitals:   12/30/22 1610  TempSrc: Oral  PainSc:          Complications: No notable events documented.

## 2022-12-31 ENCOUNTER — Encounter (HOSPITAL_COMMUNITY): Payer: Self-pay | Admitting: Orthopedic Surgery

## 2023-01-10 ENCOUNTER — Other Ambulatory Visit: Payer: Self-pay | Admitting: Cardiovascular Disease

## 2023-01-10 DIAGNOSIS — I700 Atherosclerosis of aorta: Secondary | ICD-10-CM

## 2023-01-10 DIAGNOSIS — I2510 Atherosclerotic heart disease of native coronary artery without angina pectoris: Secondary | ICD-10-CM

## 2023-01-10 DIAGNOSIS — E785 Hyperlipidemia, unspecified: Secondary | ICD-10-CM

## 2023-07-06 ENCOUNTER — Other Ambulatory Visit: Payer: Self-pay | Admitting: Orthopaedic Surgery

## 2023-07-08 ENCOUNTER — Telehealth: Payer: Self-pay

## 2023-07-13 NOTE — Telephone Encounter (Signed)
 I have not received any clearance request on this pt  I called Anita Arroyo at the number provided and had to [facility]  I gave my direct fax number and asked that she sent the request to me Will await the fax

## 2023-07-25 NOTE — Patient Instructions (Addendum)
 SURGICAL WAITING ROOM VISITATION Patients having surgery or a procedure may have no more than 2 support people in the waiting area - these visitors may rotate.    Children under the age of 91 must have an adult with them who is not the patient.  If the patient needs to stay at the hospital during part of their recovery, the visitor guidelines for inpatient rooms apply. Pre-op nurse will coordinate an appropriate time for 1 support person to accompany patient in pre-op.  This support person may not rotate.    Please refer to the [facility] website for the visitor guidelines for Inpatients (after your surgery is over and you are in a regular room).     Your procedure is scheduled on: 08-02-23   Report to [facility] Main Entrance    Report to admitting at 8:00 AM   Call this number if you have problems the morning of surgery [phone]   Do not eat food :After Midnight.   After Midnight you may have the following liquids until 7:30 AM DAY OF SURGERY  Water  Non-Citrus Juices (without pulp, NO RED-Apple, White grape, White cranberry) Black Coffee (NO MILK/CREAM OR CREAMERS, sugar ok)  Clear Tea (NO MILK/CREAM OR CREAMERS, sugar ok) regular and decaf                             Plain Jell-O (NO RED)                                           Fruit ices (not with fruit pulp, NO RED)                                     Popsicles (NO RED)                                                               Sports drinks like Gatorade (NO RED)                   The day of surgery:  Drink ONE (1) Pre-Surgery G2 by 7:30 AM the morning of surgery. Drink in one sitting. Do not sip.  This drink was given to you during your hospital  pre-op appointment visit. Nothing else to drink after completing the Pre-Surgery G2.          If you have questions, please contact your surgeon's office.   FOLLOW BOWEL PREP AND ANY ADDITIONAL PRE OP INSTRUCTIONS YOU RECEIVED FROM YOUR SURGEON'S  OFFICE!!!     Oral Hygiene is also important to reduce your risk of infection.                                    Remember - BRUSH YOUR TEETH THE MORNING OF SURGERY WITH YOUR REGULAR TOOTHPASTE   Do NOT smoke after Midnight   Take these medicines the morning of surgery with A SIP OF WATER :    Aripiprazole    Atenolol   Lamotrigine    Pantoprazole    Venlafaxine    Zyrtec   Okay to use inhalers, nasal spray   Meclizine  if needed  Stop all vitamins and herbal supplements 7 days before surgery  Jardiance - hold 3 days before surgery                               You may not have any metal on your body including hair pins, jewelry, and body piercing             Do not wear make-up, lotions, powders, perfumes, or deodorant  Do not wear nail polish including gel and S&S, artificial/acrylic nails, or any other type of covering on natural nails including finger and toenails. If you have artificial nails, gel coating, etc. that needs to be removed by a nail salon please have this removed prior to surgery or surgery may need to be canceled/ delayed if the surgeon/ anesthesia feels like they are unable to be safely monitored.   Do not shave  48 hours prior to surgery.       Do not bring valuables to the hospital. [facility] IS NOT RESPONSIBLE   FOR VALUABLES.   Contacts, dentures or bridgework may not be worn into surgery.  DO NOT BRING YOUR HOME MEDICATIONS TO THE HOSPITAL. PHARMACY WILL DISPENSE MEDICATIONS LISTED ON YOUR MEDICATION LIST TO YOU DURING YOUR ADMISSION IN THE HOSPITAL!    Patients discharged on the day of surgery will not be allowed to drive home.  Someone NEEDS to stay with you for the first 24 hours after anesthesia.               Please read over the following fact sheets you were given: IF YOU HAVE QUESTIONS ABOUT YOUR PRE-OP INSTRUCTIONS PLEASE CALL [phone] Gwen  If you received a COVID test during your pre-op visit  it is requested that you wear a mask when out  in public, stay away from anyone that may not be feeling well and notify your surgeon if you develop symptoms. If you test positive for Covid or have been in contact with anyone that has tested positive in the last 10 days please notify you surgeon.    Pre-operative 5 CHG Bath Instructions   You can play a key role in reducing the risk of infection after surgery. Your skin needs to be as free of germs as possible. You can reduce the number of germs on your skin by washing with CHG (chlorhexidine  gluconate) soap before surgery. CHG is an antiseptic soap that kills germs and continues to kill germs even after washing.   DO NOT use if you have an allergy to chlorhexidine /CHG or antibacterial soaps. If your skin becomes reddened or irritated, stop using the CHG and notify one of our RNs at [phone].   Please shower with the CHG soap starting 4 days before surgery using the following schedule:     Please keep in mind the following:  DO NOT shave, including legs and underarms, starting the day of your first shower.   You may shave your face at any point before/day of surgery.  Place clean sheets on your bed the day you start using CHG soap. Use a clean washcloth (not used since being washed) for each shower. DO NOT sleep with pets once you start using the CHG.   CHG Shower Instructions:  If you choose to wash your hair and private area, wash  first with your normal shampoo/soap.  After you use shampoo/soap, rinse your hair and body thoroughly to remove shampoo/soap residue.  Turn the water  OFF and apply about 3 tablespoons (45 ml) of CHG soap to a CLEAN washcloth.  Apply CHG soap ONLY FROM YOUR NECK DOWN TO YOUR TOES (washing for 3-5 minutes)  DO NOT use CHG soap on face, private areas, open wounds, or sores.  Pay special attention to the area where your surgery is being performed.  If you are having back surgery, having someone wash your back for you may be helpful. Wait 2 minutes after  CHG soap is applied, then you may rinse off the CHG soap.  Pat dry with a clean towel  Put on clean clothes/pajamas   If you choose to wear lotion, please use ONLY the CHG-compatible lotions on the back of this paper.     Additional instructions for the day of surgery: DO NOT APPLY any lotions, deodorants, cologne, or perfumes.   Put on clean/comfortable clothes.  Brush your teeth.  Ask your nurse before applying any prescription medications to the skin.      CHG Compatible Lotions   Aveeno Moisturizing lotion  Cetaphil Moisturizing Cream  Cetaphil Moisturizing Lotion  Clairol Herbal Essence Moisturizing Lotion, Dry Skin  Clairol Herbal Essence Moisturizing Lotion, Extra Dry Skin  Clairol Herbal Essence Moisturizing Lotion, Normal Skin  Curel Age Defying Therapeutic Moisturizing Lotion with Alpha Hydroxy  Curel Extreme Care Body Lotion  Curel Soothing Hands Moisturizing Hand Lotion  Curel Therapeutic Moisturizing Cream, Fragrance-Free  Curel Therapeutic Moisturizing Lotion, Fragrance-Free  Curel Therapeutic Moisturizing Lotion, Original Formula  Eucerin Daily Replenishing Lotion  Eucerin Dry Skin Therapy Plus Alpha Hydroxy Crme  Eucerin Dry Skin Therapy Plus Alpha Hydroxy Lotion  Eucerin Original Crme  Eucerin Original Lotion  Eucerin Plus Crme Eucerin Plus Lotion  Eucerin TriLipid Replenishing Lotion  Keri Anti-Bacterial Hand Lotion  Keri Deep Conditioning Original Lotion Dry Skin Formula Softly Scented  Keri Deep Conditioning Original Lotion, Fragrance Free Sensitive Skin Formula  Keri Lotion Fast Absorbing Fragrance Free Sensitive Skin Formula  Keri Lotion Fast Absorbing Softly Scented Dry Skin Formula  Keri Original Lotion  Keri Skin Renewal Lotion Keri Silky Smooth Lotion  Keri Silky Smooth Sensitive Skin Lotion  Nivea Body Creamy Conditioning Oil  Nivea Body Extra Enriched Lotion  Nivea Body Original Lotion  Nivea Body Sheer Moisturizing Lotion Nivea Crme   Nivea Skin Firming Lotion  NutraDerm 30 Skin Lotion  NutraDerm Skin Lotion  NutraDerm Therapeutic Skin Cream  NutraDerm Therapeutic Skin Lotion  ProShield Protective Hand Cream  Provon moisturizing lotion   PATIENT SIGNATURE_________________________________  NURSE SIGNATURE__________________________________  ________________________________________________________________________    Nasario Exon  An incentive spirometer is a tool that can help keep your lungs clear and active. This tool measures how well you are filling your lungs with each breath. Taking long deep breaths may help reverse or decrease the chance of developing breathing (pulmonary) problems (especially infection) following: A long period of time when you are unable to move or be active. BEFORE THE PROCEDURE  If the spirometer includes an indicator to show your best effort, your nurse or respiratory therapist will set it to a desired goal. If possible, sit up straight or lean slightly forward. Try not to slouch. Hold the incentive spirometer in an upright position. INSTRUCTIONS FOR USE  Sit on the edge of your bed if possible, or sit up as far as you can in bed or on a chair. Hold the  incentive spirometer in an upright position. Breathe out normally. Place the mouthpiece in your mouth and seal your lips tightly around it. Breathe in slowly and as deeply as possible, raising the piston or the ball toward the top of the column. Hold your breath for 3-5 seconds or for as long as possible. Allow the piston or ball to fall to the bottom of the column. Remove the mouthpiece from your mouth and breathe out normally. Rest for a few seconds and repeat Steps 1 through 7 at least 10 times every 1-2 hours when you are awake. Take your time and take a few normal breaths between deep breaths. The spirometer may include an indicator to show your best effort. Use the indicator as a goal to work toward during each  repetition. After each set of 10 deep breaths, practice coughing to be sure your lungs are clear. If you have an incision (the cut made at the time of surgery), support your incision when coughing by placing a pillow or rolled up towels firmly against it. Once you are able to get out of bed, walk around indoors and cough well. You may stop using the incentive spirometer when instructed by your caregiver.  RISKS AND COMPLICATIONS Take your time so you do not get dizzy or light-headed. If you are in pain, you may need to take or ask for pain medication before doing incentive spirometry. It is harder to take a deep breath if you are having pain. AFTER USE Rest and breathe slowly and easily. It can be helpful to keep track of a log of your progress. Your caregiver can provide you with a simple table to help with this. If you are using the spirometer at home, follow these instructions: SEEK MEDICAL CARE IF:  You are having difficultly using the spirometer. You have trouble using the spirometer as often as instructed. Your pain medication is not giving enough relief while using the spirometer. You develop fever of 100.5 F (38.1 C) or higher. SEEK IMMEDIATE MEDICAL CARE IF:  You cough up bloody sputum that had not been present before. You develop fever of 102 F (38.9 C) or greater. You develop worsening pain at or near the incision site. MAKE SURE YOU:  Understand these instructions. Will watch your condition. Will get help right away if you are not doing well or get worse. Document Released: 05/24/2006 Document Revised: 04/05/2011 Document Reviewed: 07/25/2006 ExitCare Patient Information 2014 [employer].   ________________________________________________________________________View Pre-Surgery Education Videos:  [email or web address]

## 2023-07-25 NOTE — Progress Notes (Signed)
 Date of COVID positive in last 90 days:  PCP - Bernita Ned, PA Cardiologist - Debby Sor, MD Pulmonologist - Almarie Ferrari, NP  Chest x-ray - 01-02-23 Epic EKG - 07-26-23 Epic Stress Test - 08-12-16 Epic  ECHO - 07-08-16 Epic Cardiac Cath - N/A Pacemaker/ICD device last checked: N/A Spinal Cord Stimulator: N/A  Bowel Prep - N/A  Sleep Study - N/A CPAP -   Prediabetes Fasting Blood Sugar - does not check  Checks Blood Sugar _____ times a day  Last dose of GLP1 agonist-  N/A GLP1 instructions:  Do not take after     Jardiance  Last dose of SGLT-2 inhibitors-  N/A SGLT-2 instructions:  Do not take after 07-29-23  Blood Thinner Instructions:  Last dose:   Time: Aspirin Instructions:  ASA 81 Last Dose:  07-23-23  Activity level:  Can go up a flight of stairs and perform activities of daily living without stopping and without symptoms of chest pain.  Patient states that she has some shortness of breath with exertion at times, but states very mild.  Rare use of inhaler per patient  Anesthesia review:  Afib, COPD, murmur, MVP  Patient denies shortness of breath, fever, cough and chest pain at PAT appointment  Patient verbalized understanding of instructions that were given to them at the PAT appointment. Patient was also instructed that they will need to review over the PAT instructions again at home before surgery.

## 2023-07-26 ENCOUNTER — Other Ambulatory Visit: Payer: Self-pay

## 2023-07-26 ENCOUNTER — Encounter (HOSPITAL_COMMUNITY)
Admission: RE | Admit: 2023-07-26 | Discharge: 2023-07-26 | Disposition: A | Discharge status: Home / Self Care | Source: Ambulatory Visit | Attending: Orthopaedic Surgery | Admitting: Orthopaedic Surgery

## 2023-07-26 ENCOUNTER — Encounter (HOSPITAL_COMMUNITY): Payer: Self-pay

## 2023-07-26 VITALS — BP 121/70 | HR 63 | Temp 98.8°F | Resp 16 | Ht 62.5 in | Wt 203.0 lb

## 2023-07-26 DIAGNOSIS — Z01818 Encounter for other preprocedural examination: Secondary | ICD-10-CM | POA: Diagnosis present

## 2023-07-26 DIAGNOSIS — F319 Bipolar disorder, unspecified: Secondary | ICD-10-CM | POA: Insufficient documentation

## 2023-07-26 DIAGNOSIS — I2510 Atherosclerotic heart disease of native coronary artery without angina pectoris: Secondary | ICD-10-CM | POA: Diagnosis not present

## 2023-07-26 DIAGNOSIS — G8929 Other chronic pain: Secondary | ICD-10-CM | POA: Diagnosis not present

## 2023-07-26 DIAGNOSIS — M1712 Unilateral primary osteoarthritis, left knee: Secondary | ICD-10-CM | POA: Diagnosis not present

## 2023-07-26 DIAGNOSIS — Z923 Personal history of irradiation: Secondary | ICD-10-CM | POA: Diagnosis not present

## 2023-07-26 DIAGNOSIS — J4489 Other specified chronic obstructive pulmonary disease: Secondary | ICD-10-CM | POA: Diagnosis not present

## 2023-07-26 DIAGNOSIS — I10 Essential (primary) hypertension: Secondary | ICD-10-CM | POA: Diagnosis not present

## 2023-07-26 DIAGNOSIS — F17200 Nicotine dependence, unspecified, uncomplicated: Secondary | ICD-10-CM | POA: Insufficient documentation

## 2023-07-26 DIAGNOSIS — H8109 Meniere's disease, unspecified ear: Secondary | ICD-10-CM | POA: Insufficient documentation

## 2023-07-26 DIAGNOSIS — R7303 Prediabetes: Secondary | ICD-10-CM | POA: Diagnosis not present

## 2023-07-26 DIAGNOSIS — Z853 Personal history of malignant neoplasm of breast: Secondary | ICD-10-CM | POA: Insufficient documentation

## 2023-07-26 DIAGNOSIS — M797 Fibromyalgia: Secondary | ICD-10-CM | POA: Diagnosis not present

## 2023-07-26 LAB — BASIC METABOLIC PANEL WITH GFR
Anion gap: 9 (ref 5–15)
BUN: 25 mg/dL — ABNORMAL HIGH (ref 8–23)
CO2: 28 mmol/L (ref 22–32)
Calcium: 9.9 mg/dL (ref 8.9–10.3)
Chloride: 100 mmol/L (ref 98–111)
Creatinine, Ser: 1.28 mg/dL — ABNORMAL HIGH (ref 0.44–1.00)
GFR, Estimated: 47 mL/min — ABNORMAL LOW (ref >60)
Glucose, Bld: 83 mg/dL (ref 70–99)
Potassium: 3.7 mmol/L (ref 3.5–5.1)
Sodium: 137 mmol/L (ref 135–145)

## 2023-07-26 LAB — CBC
HCT: 40.2 % (ref 36.0–46.0)
Hemoglobin: 13 g/dL (ref 12.0–15.0)
MCH: 30.4 pg (ref 26.0–34.0)
MCHC: 32.3 g/dL (ref 30.0–36.0)
MCV: 94.1 fL (ref 80.0–100.0)
Platelets: 237 10*3/uL (ref 150–400)
RBC: 4.27 MIL/uL (ref 3.87–5.11)
RDW: 13 % (ref 11.5–15.5)
WBC: 5 10*3/uL (ref 4.0–10.5)
nRBC: 0 % (ref 0.0–0.2)

## 2023-07-26 LAB — SURGICAL PCR SCREEN
MRSA, PCR: NEGATIVE
Staphylococcus aureus: NEGATIVE

## 2023-07-26 NOTE — H&P (Signed)
 TOTAL KNEE ADMISSION H&P  Patient is being admitted for left total knee arthroplasty.  Subjective:  Chief Complaint:left knee pain.  HPI: Anita Arroyo, 63 y.o. female, has a history of pain and functional disability in the left knee due to arthritis and has failed non-surgical conservative treatments for greater than 12 weeks to includeNSAID's and/or analgesics, corticosteriod injections, flexibility and strengthening excercises, supervised PT with diminished ADL's post treatment, use of assistive devices, weight reduction as appropriate, and activity modification.  Onset of symptoms was gradual, starting 5 years ago with gradually worsening course since that time. The patient noted no past surgery on the left knee(s).  Patient currently rates pain in the left knee(s) at 10 out of 10 with activity. Patient has night pain, worsening of pain with activity and weight bearing, pain that interferes with activities of daily living, crepitus, and joint swelling.  Patient has evidence of subchondral cysts, subchondral sclerosis, periarticular osteophytes, and joint space narrowing by imaging studies. There is no active infection.  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   Aortic atherosclerosis (HCC) 01/02/2019   Chronic prescription opiate use 11/30/2018   Closed displaced fracture of proximal phalanx of left little finger 05/09/2018   DOE (dyspnea on exertion) 11/03/2017   Prediabetes 10/21/2016   Headache 10/12/2016   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   Chronic pain syndrome 05/27/2016   Osteoarthritis 05/16/2016    Seasonal allergies 05/03/2016   History of palpitations 09/16/2015   Bipolar 1 disorder (HCC) 09/16/2015   History of breast cancer 11/16/2012   Fibromyalgia 11/16/2012   Cigarette smoker 11/16/2012   INTERTRIGO 04/13/2010   Hyperlipidemia 09/02/2006   Depression 09/02/2006   Essential hypertension 09/02/2006   Chronic obstructive pulmonary disease (HCC) 09/02/2006   GERD 09/02/2006   Past Medical History:  Diagnosis Date   Allergy    pollen and environmental   Anxiety    Arthritis    Asthma    Bipolar 1 disorder (HCC)    Bipolar 1 disorder (HCC)    Breast cancer (HCC)    Cataract    COPD (chronic obstructive pulmonary disease) (HCC)    Depression    Dysrhythmia    palpitations   Dysrhythmia    atrial fibrillation   Fibromyalgia    Finger fracture 2011   GERD (gastroesophageal reflux disease)    Heart murmur    History of stress test 04/28/2011   showed normal perfusion without scar or ischemia   Hx of echocardiogram 04/28/2011   showed normal systolic function with mild diastolic dysfunction,she had trace MR but did not have frank mitral valve prolapse demonstrated. She had mild pulmonary hypertension with an estimated RV systolic pressure at 34 mm.   Hyperlipemia    Meniere disease    MVP (mitral valve prolapse)    Neuromuscular disorder (HCC)    fibromyalgia   Pre-diabetes     Past Surgical History:  Procedure Laterality Date   ANKLE SURGERY     left x4   BREAST LUMPECTOMY Right 2011   BREAST SURGERY  2011   lumpectomy right breast    COLONOSCOPY     COLONOSCOPY WITH  PROPOFOL  N/A 02/25/2014   Procedure: COLONOSCOPY WITH PROPOFOL ;  Surgeon: Gladis MARLA Louder, MD;  Location: [facility];  Service: Endoscopy;  Laterality: N/A;   deviated septum     ECTOPIC PREGNANCY SURGERY     ELBOW SURGERY     right elbow   EYE SURGERY Bilateral    cataract surgery   fibroid     2-3 fibroid adenomas removed   HAND SURGERY Left 2020   fall from Dog, pinkie finger, ring  finger, middle finger   JOINT REPLACEMENT  2011   right knee    KNEE SURGERY     left knee   POLYPECTOMY     REVERSE SHOULDER ARTHROPLASTY Right 12/30/2022   Procedure: REVERSE SHOULDER ARTHROPLASTY;  Surgeon: Dozier Soulier, MD;  Location: [facility];  Service: Orthopedics;  Laterality: Right;   SHOULDER ARTHROSCOPY WITH BICEPSTENOTOMY Right 09/25/2015   Procedure: SHOULDER ARTHROSCOPY WITH BICEPSTENOTOMY;  Surgeon: Toribio JULIANNA Chancy, MD;  Location: [facility];  Service: Orthopedics;  Laterality: Right;   SHOULDER ARTHROSCOPY WITH DISTAL CLAVICLE RESECTION Right 09/25/2015   Procedure: SHOULDER ARTHROSCOPY WITH DISTAL CLAVICLE RESECTION;  Surgeon: Toribio JULIANNA Chancy, MD;  Location: [facility];  Service: Orthopedics;  Laterality: Right;   SHOULDER ARTHROSCOPY WITH SUBACROMIAL DECOMPRESSION Right 09/25/2015   Procedure: RIGHT SHOULDER ARTHROSCOPY DEBRIDEMENT,ACROMIOPLASTY,DISTAL CLAVICAL EXCISION, RELEASE OF BICEPS TENDON;  Surgeon: Toribio JULIANNA Chancy, MD;  Location: [facility];  Service: Orthopedics;  Laterality: Right;   TMJ ARTHROPLASTY     TONSILLECTOMY     TOTAL SHOULDER ARTHROPLASTY Left 01/05/2016   UTERINE FIBROID SURGERY     polups and fibroids removed    WRIST SURGERY     left    No current facility-administered medications for this encounter.   Current Outpatient Medications  Medication Sig Dispense Refill Last Dose/Taking   albuterol  (VENTOLIN  HFA) 108 (90 Base) MCG/ACT inhaler Inhale 2 puffs into the lungs every 4 (four) hours as needed for wheezing or shortness of breath (cough, shortness of breath or wheezing.). 8 g 1 Taking As Needed   ARIPiprazole  (ABILIFY ) 5 MG tablet Take 1 tablet (5 mg total) by mouth daily. 90 tablet 1 Taking   aspirin EC 81 MG tablet Take 81 mg by mouth daily. Swallow whole.   Taking   atenolol  (TENORMIN ) 50 MG tablet Take 50 mg in the morning and 25 mg in the evening 135 tablet 1 Taking   BELSOMRA 15 MG TABS Take  15 mg by mouth at bedtime.   Taking   Biotin 89999 MCG TABS Take 1 tablet by mouth daily.   Taking   CALCIUM  CARBONATE-VITAMIN D  PO Take 1 tablet by mouth daily.   Taking   cetirizine (ZYRTEC) 10 MG tablet Take 10 mg by mouth daily.   Taking   diclofenac sodium (VOLTAREN) 1 % GEL Apply 1 Application topically daily as needed (pain).   Taking As Needed   fluticasone  (CUTIVATE ) 0.05 % cream Apply 1 Application topically 2 (two) times daily as needed (irritation).   Taking As Needed   fluticasone  (FLONASE ) 50 MCG/ACT nasal spray USE 1 SPRAY IN EACH NOSTRIL TWICE DAILY 48 g 1 Taking   Fluticasone -Salmeterol 113-14 MCG/ACT AEPB Inhale 1 PUFF into the lungs 2 TIMES DAILY 1 each 11 Taking   HYDROcodone -acetaminophen  (NORCO/VICODIN) 5-325 MG tablet Take 1 tablet by mouth 4 (four) times daily.   Taking   JARDIANCE 10 MG TABS tablet Take 10 mg by mouth daily.   Taking   L-Methylfolate 15  MG TABS Take 1 tablet (15 mg total) by mouth daily. 90 tablet 1 Taking   lamoTRIgine  (LAMICTAL ) 200 MG tablet TAKE 1/2 TABLET EVERY MORNING  AND TAKE 2 TABLETS AT BEDTIME (Patient taking differently: Take 450 mg by mouth at bedtime.) 225 tablet 3 Taking Differently   LUTEIN PO Take 2 mg by mouth daily.   Taking   Magnesium 500 MG TABS Take 500 mg by mouth daily.   Taking   montelukast  (SINGULAIR ) 10 MG tablet Take 1 tablet (10 mg total) by mouth at bedtime. 90 tablet 3 Taking   pantoprazole  (PROTONIX ) 40 MG tablet Take 1 tablet (40 mg total) by mouth daily. 90 tablet 1 Taking   pramipexole  (MIRAPEX ) 0.125 MG tablet Take 2 tablets (0.25 mg total) by mouth every evening. 180 tablet 3 Taking   REPATHA  SURECLICK 140 MG/ML SOAJ INJECT 140 mgs into THE SKIN EVERY 14 DAYS 6 mL 3 Taking   traZODone  (DESYREL ) 100 MG tablet Take 2 tablets (200 mg total) by mouth at bedtime. 180 tablet 1 Taking   triamterene -hydrochlorothiazide (MAXZIDE-25) 37.5-25 MG tablet Take 0.5 tablets daily by mouth. 90 tablet 3 Taking   venlafaxine  XR  (EFFEXOR -XR) 150 MG 24 hr capsule TAKE 2 CAPSULES EVERY DAY WITH BREAKFAST 180 capsule 1 Taking   meclizine  (ANTIVERT ) 25 MG tablet Take 1 tablet (25 mg total) by mouth as needed for dizziness. 30 tablet 1    Allergies  Allergen Reactions   Contrast Media [Iodinated Contrast Media] Anaphylaxis    Anaphylactic shock.   Celebrex [Celecoxib] Other (See Comments)    I climb the walls   Statins Other (See Comments)    Body aches, constipation AND DEPRESSION   Sulfa Antibiotics Nausea And Vomiting    Other Reaction(s): GI Intolerance   Zetia  [Ezetimibe ] Other (See Comments)    I can't take any cholesterol medicines.  Other Reaction(s): Other (See Comments)   Augmentin [Amoxicillin-Pot Clavulanate] Rash and Other (See Comments)   Ceclor [Cefaclor] Rash   Moxifloxacin Rash   Sulfonamide Derivatives Nausea And Vomiting    Social History   Tobacco Use   Smoking status: Former    Average packs/day: 2.0 packs/day for 1 year (2.0 ttl pk-yrs)    Types: Cigarettes    Start date: 42    Quit date: 01/26/1975    Years since quitting: 48.5   Smokeless tobacco: Never   Tobacco comments:    Quit smoking 11 weeks.  hfb  Substance Use Topics   Alcohol use: Yes    Comment: Social    Family History  Problem Relation Age of Onset   Breast cancer Mother        biological mother   Mitral valve prolapse Mother    Heart Problems Mother    Cancer Mother    Colon cancer Maternal Grandfather    Esophageal cancer Neg Hx    Rectal cancer Neg Hx    Stomach cancer Neg Hx      Review of Systems  Musculoskeletal:  Positive for arthralgias.       Left knee  All other systems reviewed and are negative.   Objective:  Physical Exam Constitutional:      Appearance: Normal appearance.  HENT:     Head: Normocephalic and atraumatic.     Nose: Nose normal.     Mouth/Throat:     Pharynx: Oropharynx is clear.   Eyes:     Extraocular Movements: Extraocular movements intact.   Pulmonary:      Effort:  Pulmonary effort is normal.  Abdominal:     Palpations: Abdomen is soft.   Musculoskeletal:     Cervical back: Normal range of motion.     Comments: Left knee motion is from 0 to almost 100.  She has medial and lateral pain with crepitation.  Hip motion is good and straight leg raise is negative.     Skin:    General: Skin is warm and dry.   Neurological:     General: No focal deficit present.     Mental Status: She is alert and oriented to person, place, and time.   Psychiatric:        Mood and Affect: Mood normal.        Behavior: Behavior normal.        Thought Content: Thought content normal.        Judgment: Judgment normal.     Vital signs in last 24 hours: Temp:  [98.8 F (37.1 C)] 98.8 F (37.1 C) (07/01 1014) Pulse Rate:  [63] 63 (07/01 1014) Resp:  [16] 16 (07/01 1014) BP: (121)/(70) 121/70 (07/01 1014) SpO2:  [96 %] 96 % (07/01 1014) Weight:  [92.1 kg] 92.1 kg (07/01 1014)  Labs:   Estimated body mass index is 36.54 kg/m as calculated from the following:   Height as of 07/26/23: 5' 2.5 (1.588 m).   Weight as of 07/26/23: 92.1 kg.   Imaging Review Plain radiographs demonstrate severe degenerative joint disease of the left knee(s). The overall alignment isneutral. The bone quality appears to be good for age and reported activity level.      Assessment/Plan:  End stage primary arthritis, left knee   The patient history, physical examination, clinical judgment of the provider and imaging studies are consistent with end stage degenerative joint disease of the left knee(s) and total knee arthroplasty is deemed medically necessary. The treatment options including medical management, injection therapy arthroscopy and arthroplasty were discussed at length. The risks and benefits of total knee arthroplasty were presented and reviewed. The risks due to aseptic loosening, infection, stiffness, patella tracking problems, thromboembolic complications and other  imponderables were discussed. The patient acknowledged the explanation, agreed to proceed with the plan and consent was signed. Patient is being admitted for inpatient treatment for surgery, pain control, PT, OT, prophylactic antibiotics, VTE prophylaxis, progressive ambulation and ADL's and discharge planning. The patient is planning to be discharged home with home health services  Patient's anticipated LOS is less than 2 midnights, meeting these requirements: - Younger than 35 - Lives within 1 hour of care - Has a competent adult at home to recover with post-op recover - NO history of  - Chronic pain requiring opiods  - Diabetes  - Coronary Artery Disease  - Heart failure  - Heart attack  - Stroke  - DVT/VTE  - Cardiac arrhythmia  - Respiratory Failure/COPD  - Renal failure  - Anemia  - Advanced Liver disease

## 2023-07-27 ENCOUNTER — Encounter (HOSPITAL_COMMUNITY): Payer: Self-pay

## 2023-07-27 NOTE — Progress Notes (Signed)
 Case: [id number] Date/Time: 08/02/23 1015   Procedure: ARTHROPLASTY, KNEE, TOTAL (Left: Knee) - LEFT TOTAL KNEE ARTHOPLASTY   Anesthesia type: Spinal   Pre-op diagnosis: LEFT KNEE DEGENERATIVE JOINT DISEASE   [facility]: [facility]   Surgeons: Sheril Coy, MD       DISCUSSION: Anita Arroyo is a 63 year old female who presents to PAT prior to surgery above.  Past medical history significant for current smoking, CAD (by CT), palpitations, asthma, COPD, Mnire's disease, GERD, prediabetes, bipolar disorder, anxiety, arthritis, fibromyalgia, breast cancer s/p lumpectomy and XRT (2011), chronic pain with narcotic dependence.  Patient had shoulder surgery in December 2024 which was uncomplicated  Patient follows with cardiology for history of hypertension and CAD by CT. Nuclear stress test 07/2016 showed an EF of 55-65% no ischemia and low risk.  Echocardiogram 06/2016 showed normal systolic function and G1 DD. Of note, patient has hx of A. Fib in her medical history which dates back to 2016. Cardiology notes and prior EKGs reviewed with no mention of A.fib. Will remove this from her medical hx. She does have a hx of palpitations for which she takes Atenolol . Last seen in clinic on 05/07/22. Stable at that visit. She had a cardiac clearance tele visit on 10/13/22 and was cleared.  She is on medical therapy for her CAD.  Pt follows with Pulmonology for COPD. Last seen on 10/18/22. Per OV note,  Improved; Breathing is stable, cough has resolved. No recent exacerbations requiring antibiotics or steroids.  Rare SABA use.  Continue fluticasone  salmeterol as directed.  Risk assessment for shoulder surgery in December was done at that time and patient was thought to be low risk.  Seen by PCP on 05/12/23 for routine f/u. All issues stable  LD Jardiance: 7/4  VS: BP 121/70   Pulse 63   Temp 37.1 C (Oral)   Resp 16   Ht 5' 2.5 (1.588 m)   Wt 92.1 kg   LMP 05/30/2011   SpO2 96%   BMI 36.54 kg/m    PROVIDERS: Juliane Che, PA   LABS: Labs reviewed: Acceptable for surgery.  Mild AKI on PAT labs (all labs ordered are listed, but only abnormal results are displayed)  Labs Reviewed  BASIC METABOLIC PANEL WITH GFR - Abnormal; Notable for the following components:      Result Value   BUN 25 (*)    Creatinine, Ser 1.28 (*)    GFR, Estimated 47 (*)    All other components within normal limits  SURGICAL PCR SCREEN  CBC     IMAGES: CXR 12/20/22:  FINDINGS: Frontal and lateral views of the chest demonstrate an unremarkable cardiac silhouette. No airspace disease, effusion, or pneumothorax. Stable left shoulder arthroplasty. No acute fracture.   IMPRESSION: 1. No acute intrathoracic process.   CT chest 09/20/2022  IMPRESSION: Lung-RADS 2, benign appearance or behavior. Continue annual screening with low-dose chest CT without contrast in 12 months.   Aortic Atherosclerosis (ICD10-I70.0) and Emphysema (ICD10-J43.9).   EKG 07/26/2023: Normal sinus rhythm, rate 63 Moderate voltage criteria for LVH, may be normal variant ( R in aVL , Cornell product ) Borderline ECG  CV: Stress test 08/12/2016: The left ventricular ejection fraction is normal (55-65%). Nuclear stress EF: 58%. There was no ST segment deviation noted during stress. No T wave inversion was noted during stress. The study is normal. This is a low risk study.  Echo 07/08/2016: Study Conclusions  - Left ventricle: The cavity size was normal. Wall thickness  was   normal. Systolic function was normal. The estimated ejection   fraction was in the range of 60% to 65%. Doppler parameters are   consistent with abnormal left ventricular relaxation (grade 1   diastolic dysfunction).  - Aortic valve:   Mildly thickened leaflets.  Doppler:  There was no regurgitation.  - Mitral valve:   Mildly thickened leaflets .  Doppler:  There was trivial regurgitation.    Valve area by pressure half-time: 2.72 cm^2.  Indexed valve area by pressure half-time: 1.42 cm^2/m^2.  Past Medical History:  Diagnosis Date   Allergy    pollen and environmental   Anxiety    Arthritis    Asthma    Bipolar 1 disorder (HCC)    Breast cancer (HCC)    Cataract    COPD (chronic obstructive pulmonary disease) (HCC)    Depression    Fibromyalgia    Finger fracture 2011   GERD (gastroesophageal reflux disease)    Heart murmur    History of stress test 04/28/2011   showed normal perfusion without scar or ischemia   Hx of echocardiogram 04/28/2011   showed normal systolic function with mild diastolic dysfunction,she had trace MR but did not have frank mitral valve prolapse demonstrated. She had mild pulmonary hypertension with an estimated RV systolic pressure at 34 mm.   Hyperlipemia    Meniere disease    MVP (mitral valve prolapse)    Pre-diabetes     Past Surgical History:  Procedure Laterality Date   ANKLE SURGERY     left x4   BREAST LUMPECTOMY Right 2011   BREAST SURGERY  2011   lumpectomy right breast    COLONOSCOPY     COLONOSCOPY WITH PROPOFOL  N/A 02/25/2014   Procedure: COLONOSCOPY WITH PROPOFOL ;  Surgeon: Gladis MARLA Louder, MD;  [facility];  Service: Endoscopy;  Laterality: N/A;   deviated septum     ECTOPIC PREGNANCY SURGERY     ELBOW SURGERY     right elbow   EYE SURGERY Bilateral    cataract surgery   fibroid     2-3 fibroid adenomas removed   HAND SURGERY Left 2020   fall from Dog, pinkie finger, ring finger, middle finger   JOINT REPLACEMENT  2011   right knee    KNEE SURGERY     left knee   POLYPECTOMY     REVERSE SHOULDER ARTHROPLASTY Right 12/30/2022   Procedure: REVERSE SHOULDER ARTHROPLASTY;  Surgeon: Dozier Soulier, MD;  [facility];  Service: Orthopedics;  Laterality: Right;   SHOULDER ARTHROSCOPY WITH BICEPSTENOTOMY Right 09/25/2015   Procedure: SHOULDER ARTHROSCOPY WITH BICEPSTENOTOMY;  Surgeon: Toribio JULIANNA Chancy, MD;  Location: [facility];   Service: Orthopedics;  Laterality: Right;   SHOULDER ARTHROSCOPY WITH DISTAL CLAVICLE RESECTION Right 09/25/2015   Procedure: SHOULDER ARTHROSCOPY WITH DISTAL CLAVICLE RESECTION;  Surgeon: Toribio JULIANNA Chancy, MD;  Location: [facility];  Service: Orthopedics;  Laterality: Right;   SHOULDER ARTHROSCOPY WITH SUBACROMIAL DECOMPRESSION Right 09/25/2015   Procedure: RIGHT SHOULDER ARTHROSCOPY DEBRIDEMENT,ACROMIOPLASTY,DISTAL CLAVICAL EXCISION, RELEASE OF BICEPS TENDON;  Surgeon: Toribio JULIANNA Chancy, MD;  [facility];  Service: Orthopedics;  Laterality: Right;   TMJ ARTHROPLASTY     TONSILLECTOMY     TOTAL SHOULDER ARTHROPLASTY Left 01/05/2016   UTERINE FIBROID SURGERY     polups and fibroids removed    WRIST SURGERY     left    MEDICATIONS:  albuterol  (VENTOLIN  HFA) 108 (90 Base) MCG/ACT inhaler  ARIPiprazole  (ABILIFY ) 5 MG tablet   aspirin EC 81 MG tablet   atenolol  (TENORMIN ) 50 MG tablet   BELSOMRA 15 MG TABS   Biotin 89999 MCG TABS   CALCIUM  CARBONATE-VITAMIN D  PO   cetirizine (ZYRTEC) 10 MG tablet   diclofenac sodium (VOLTAREN) 1 % GEL   fluticasone  (CUTIVATE ) 0.05 % cream   fluticasone  (FLONASE ) 50 MCG/ACT nasal spray   Fluticasone -Salmeterol 113-14 MCG/ACT AEPB   HYDROcodone -acetaminophen  (NORCO/VICODIN) 5-325 MG tablet   JARDIANCE 10 MG TABS tablet   L-Methylfolate 15 MG TABS   lamoTRIgine  (LAMICTAL ) 200 MG tablet   LUTEIN PO   Magnesium 500 MG TABS   meclizine  (ANTIVERT ) 25 MG tablet   montelukast  (SINGULAIR ) 10 MG tablet   pantoprazole  (PROTONIX ) 40 MG tablet   pramipexole  (MIRAPEX ) 0.125 MG tablet   REPATHA  SURECLICK 140 MG/ML SOAJ   traZODone  (DESYREL ) 100 MG tablet   triamterene -hydrochlorothiazide (MAXZIDE-25) 37.5-25 MG tablet   venlafaxine  XR (EFFEXOR -XR) 150 MG 24 hr capsule   No current facility-administered medications for this encounter.   Burnard CHRISTELLA Odis DEVONNA [facility]/Anesthesiology [facility] Phone [phone] 07/27/2023 11:48 AM

## 2023-08-02 ENCOUNTER — Ambulatory Visit (HOSPITAL_COMMUNITY)
Admission: RE | Admit: 2023-08-02 | Discharge: 2023-08-02 | Disposition: A | Discharge status: Home / Self Care | Source: Ambulatory Visit | Attending: Orthopaedic Surgery | Admitting: Orthopaedic Surgery

## 2023-08-02 ENCOUNTER — Other Ambulatory Visit: Payer: Self-pay

## 2023-08-02 ENCOUNTER — Ambulatory Visit (HOSPITAL_COMMUNITY): Admitting: Certified Registered Nurse Anesthetist

## 2023-08-02 ENCOUNTER — Ambulatory Visit (HOSPITAL_COMMUNITY): Payer: Self-pay | Admitting: Medical

## 2023-08-02 ENCOUNTER — Encounter (HOSPITAL_COMMUNITY): Payer: Self-pay | Admitting: Orthopaedic Surgery

## 2023-08-02 ENCOUNTER — Encounter (HOSPITAL_COMMUNITY)
Admission: RE | Disposition: A | Discharge status: Home / Self Care | Payer: Self-pay | Source: Ambulatory Visit | Attending: Orthopaedic Surgery

## 2023-08-02 DIAGNOSIS — Z7951 Long term (current) use of inhaled steroids: Secondary | ICD-10-CM | POA: Diagnosis not present

## 2023-08-02 DIAGNOSIS — Z79899 Other long term (current) drug therapy: Secondary | ICD-10-CM | POA: Insufficient documentation

## 2023-08-02 DIAGNOSIS — E669 Obesity, unspecified: Secondary | ICD-10-CM | POA: Insufficient documentation

## 2023-08-02 DIAGNOSIS — N189 Chronic kidney disease, unspecified: Secondary | ICD-10-CM | POA: Insufficient documentation

## 2023-08-02 DIAGNOSIS — Z6836 Body mass index (BMI) 36.0-36.9, adult: Secondary | ICD-10-CM | POA: Diagnosis not present

## 2023-08-02 DIAGNOSIS — J449 Chronic obstructive pulmonary disease, unspecified: Secondary | ICD-10-CM | POA: Insufficient documentation

## 2023-08-02 DIAGNOSIS — Z87891 Personal history of nicotine dependence: Secondary | ICD-10-CM | POA: Diagnosis not present

## 2023-08-02 DIAGNOSIS — I129 Hypertensive chronic kidney disease with stage 1 through stage 4 chronic kidney disease, or unspecified chronic kidney disease: Secondary | ICD-10-CM | POA: Diagnosis not present

## 2023-08-02 DIAGNOSIS — H8109 Meniere's disease, unspecified ear: Secondary | ICD-10-CM | POA: Diagnosis not present

## 2023-08-02 DIAGNOSIS — R7303 Prediabetes: Secondary | ICD-10-CM | POA: Diagnosis not present

## 2023-08-02 DIAGNOSIS — F319 Bipolar disorder, unspecified: Secondary | ICD-10-CM | POA: Diagnosis not present

## 2023-08-02 DIAGNOSIS — G894 Chronic pain syndrome: Secondary | ICD-10-CM | POA: Insufficient documentation

## 2023-08-02 DIAGNOSIS — E785 Hyperlipidemia, unspecified: Secondary | ICD-10-CM | POA: Insufficient documentation

## 2023-08-02 DIAGNOSIS — F419 Anxiety disorder, unspecified: Secondary | ICD-10-CM | POA: Insufficient documentation

## 2023-08-02 DIAGNOSIS — Z7982 Long term (current) use of aspirin: Secondary | ICD-10-CM | POA: Diagnosis not present

## 2023-08-02 DIAGNOSIS — M1712 Unilateral primary osteoarthritis, left knee: Secondary | ICD-10-CM | POA: Insufficient documentation

## 2023-08-02 DIAGNOSIS — I700 Atherosclerosis of aorta: Secondary | ICD-10-CM | POA: Diagnosis not present

## 2023-08-02 DIAGNOSIS — R519 Headache, unspecified: Secondary | ICD-10-CM | POA: Insufficient documentation

## 2023-08-02 HISTORY — PX: TOTAL KNEE ARTHROPLASTY: SHX125

## 2023-08-02 SURGERY — ARTHROPLASTY, KNEE, TOTAL
Anesthesia: Regional | Site: Knee | Laterality: Left

## 2023-08-02 MED ORDER — OXYCODONE HCL 5 MG/5ML PO SOLN: 5.0000 mg | Freq: Once | ORAL | Status: DC | PRN

## 2023-08-02 MED ORDER — 0.9 % SODIUM CHLORIDE (POUR BTL) OPTIME
TOPICAL | Status: DC | PRN
  Administered 2023-08-02: 1000 mL

## 2023-08-02 MED ORDER — CEFAZOLIN SODIUM 1 G IJ SOLR
INTRAMUSCULAR | Status: AC
  Filled 2023-08-02: qty 10

## 2023-08-02 MED ORDER — ASPIRIN 81 MG PO TBEC: 81.0000 mg | DELAYED_RELEASE_TABLET | Freq: Two times a day (BID) | ORAL | 0 refills | Status: AC

## 2023-08-02 MED ORDER — VANCOMYCIN HCL IN DEXTROSE 1-5 GM/200ML-% IV SOLN
1000.0000 mg | INTRAVENOUS | Status: AC
  Administered 2023-08-02: 1000 mg via INTRAVENOUS
  Filled 2023-08-02: qty 200

## 2023-08-02 MED ORDER — LACTATED RINGERS IV BOLUS
500.0000 mL | Freq: Once | INTRAVENOUS | Status: AC
  Administered 2023-08-02: 500 mL via INTRAVENOUS

## 2023-08-02 MED ORDER — POVIDONE-IODINE 7.5 % EX SOLN: Freq: Once | CUTANEOUS | Status: DC

## 2023-08-02 MED ORDER — PHENYLEPHRINE HCL-NACL 20-0.9 MG/250ML-% IV SOLN
INTRAVENOUS | Status: DC | PRN
  Administered 2023-08-02: 20 ug/min via INTRAVENOUS

## 2023-08-02 MED ORDER — AMISULPRIDE (ANTIEMETIC) 5 MG/2ML IV SOLN: 10.0000 mg | Freq: Once | INTRAVENOUS | Status: DC | PRN

## 2023-08-02 MED ORDER — TIZANIDINE HCL 4 MG PO TABS: 4.0000 mg | ORAL_TABLET | Freq: Four times a day (QID) | ORAL | 1 refills | Status: AC | PRN

## 2023-08-02 MED ORDER — PROPOFOL 1000 MG/100ML IV EMUL
INTRAVENOUS | Status: AC
  Filled 2023-08-02: qty 100

## 2023-08-02 MED ORDER — SODIUM CHLORIDE 0.9 % IV SOLN
INTRAVENOUS | Status: DC | PRN
  Administered 2023-08-02: 80 mL

## 2023-08-02 MED ORDER — BUPIVACAINE-EPINEPHRINE (PF) 0.5% -1:200000 IJ SOLN
INTRAMUSCULAR | Status: AC
  Filled 2023-08-02: qty 30

## 2023-08-02 MED ORDER — FENTANYL CITRATE PF 50 MCG/ML IJ SOSY: 25.0000 ug | PREFILLED_SYRINGE | INTRAMUSCULAR | Status: DC | PRN

## 2023-08-02 MED ORDER — PHENYLEPHRINE HCL (PRESSORS) 10 MG/ML IV SOLN
INTRAVENOUS | Status: AC
Start: 2023-08-02 — End: 2023-08-02
  Filled 2023-08-02: qty 1

## 2023-08-02 MED ORDER — LACTATED RINGERS IV SOLN: INTRAVENOUS | Status: DC

## 2023-08-02 MED ORDER — LACTATED RINGERS IV BOLUS
250.0000 mL | Freq: Once | INTRAVENOUS | Status: AC
  Administered 2023-08-02: 250 mL via INTRAVENOUS

## 2023-08-02 MED ORDER — PROPOFOL 500 MG/50ML IV EMUL
INTRAVENOUS | Status: DC | PRN
  Administered 2023-08-02: 75 ug/kg/min via INTRAVENOUS

## 2023-08-02 MED ORDER — BUPIVACAINE-EPINEPHRINE (PF) 0.5% -1:200000 IJ SOLN
INTRAMUSCULAR | Status: DC | PRN
Start: 2023-08-02 — End: 2023-08-02
  Administered 2023-08-02: 30 mL via PERINEURAL

## 2023-08-02 MED ORDER — ONDANSETRON HCL 4 MG/2ML IJ SOLN
INTRAMUSCULAR | Status: AC
Start: 2023-08-02 — End: 2023-08-02
  Filled 2023-08-02: qty 2

## 2023-08-02 MED ORDER — BUPIVACAINE LIPOSOME 1.3 % IJ SUSP
INTRAMUSCULAR | Status: AC
  Filled 2023-08-02: qty 20

## 2023-08-02 MED ORDER — TRANEXAMIC ACID 1000 MG/10ML IV SOLN
2000.0000 mg | Freq: Once | INTRAVENOUS | Status: AC
  Administered 2023-08-02: 2000 mg via TOPICAL
  Filled 2023-08-02: qty 20

## 2023-08-02 MED ORDER — SODIUM CHLORIDE (PF) 0.9 % IJ SOLN
INTRAMUSCULAR | Status: AC
  Filled 2023-08-02: qty 30

## 2023-08-02 MED ORDER — TRANEXAMIC ACID-NACL 1000-0.7 MG/100ML-% IV SOLN
1000.0000 mg | Freq: Once | INTRAVENOUS | Status: AC
  Administered 2023-08-02: 1000 mg via INTRAVENOUS

## 2023-08-02 MED ORDER — MIDAZOLAM HCL 2 MG/2ML IJ SOLN
1.0000 mg | INTRAMUSCULAR | Status: DC | PRN
  Administered 2023-08-02: 2 mg via INTRAVENOUS
  Filled 2023-08-02: qty 2

## 2023-08-02 MED ORDER — OXYCODONE HCL 5 MG PO TABS: 5.0000 mg | ORAL_TABLET | Freq: Four times a day (QID) | ORAL | 0 refills | Status: AC | PRN

## 2023-08-02 MED ORDER — DEXAMETHASONE SODIUM PHOSPHATE 10 MG/ML IJ SOLN
INTRAMUSCULAR | Status: DC | PRN
  Administered 2023-08-02: 10 mg via INTRAVENOUS

## 2023-08-02 MED ORDER — TRANEXAMIC ACID-NACL 1000-0.7 MG/100ML-% IV SOLN
INTRAVENOUS | Status: AC
  Filled 2023-08-02: qty 100

## 2023-08-02 MED ORDER — TRANEXAMIC ACID-NACL 1000-0.7 MG/100ML-% IV SOLN
1000.0000 mg | INTRAVENOUS | Status: DC
  Filled 2023-08-02 (×2): qty 100

## 2023-08-02 MED ORDER — BUPIVACAINE IN DEXTROSE 0.75-8.25 % IT SOLN
INTRATHECAL | Status: DC | PRN
  Administered 2023-08-02: 1.6 mL via INTRATHECAL

## 2023-08-02 MED ORDER — SODIUM CHLORIDE 0.9 % IR SOLN
Status: DC | PRN
  Administered 2023-08-02: 1000 mL

## 2023-08-02 MED ORDER — OXYCODONE HCL 5 MG PO TABS: 5.0000 mg | ORAL_TABLET | Freq: Once | ORAL | Status: DC | PRN

## 2023-08-02 MED ORDER — CHLORHEXIDINE GLUCONATE 0.12 % MT SOLN
15.0000 mL | Freq: Once | OROMUCOSAL | Status: AC
  Administered 2023-08-02: 15 mL via OROMUCOSAL

## 2023-08-02 MED ORDER — METHOCARBAMOL 500 MG PO TABS: 500.0000 mg | ORAL_TABLET | Freq: Four times a day (QID) | ORAL | Status: DC | PRN

## 2023-08-02 MED ORDER — FENTANYL CITRATE PF 50 MCG/ML IJ SOSY
50.0000 ug | PREFILLED_SYRINGE | INTRAMUSCULAR | Status: DC | PRN
  Filled 2023-08-02: qty 2

## 2023-08-02 MED ORDER — POVIDONE-IODINE 10 % EX SWAB
2.0000 | Freq: Once | CUTANEOUS | Status: AC
  Administered 2023-08-02: 2 via TOPICAL

## 2023-08-02 MED ORDER — PROPOFOL 10 MG/ML IV BOLUS
INTRAVENOUS | Status: DC | PRN
  Administered 2023-08-02: 40 mg via INTRAVENOUS

## 2023-08-02 MED ORDER — DEXAMETHASONE SODIUM PHOSPHATE 10 MG/ML IJ SOLN
INTRAMUSCULAR | Status: AC
Start: 2023-08-02 — End: 2023-08-02
  Filled 2023-08-02: qty 1

## 2023-08-02 MED ORDER — METHOCARBAMOL 1000 MG/10ML IJ SOLN: 500.0000 mg | Freq: Four times a day (QID) | INTRAMUSCULAR | Status: DC | PRN

## 2023-08-02 MED ORDER — TRANEXAMIC ACID-NACL 1000-0.7 MG/100ML-% IV SOLN
INTRAVENOUS | Status: DC | PRN
  Administered 2023-08-02: 1000 mg via INTRAVENOUS

## 2023-08-02 MED ORDER — ORAL CARE MOUTH RINSE: 15.0000 mL | Freq: Once | OROMUCOSAL | Status: AC

## 2023-08-02 MED ORDER — CEFAZOLIN SODIUM-DEXTROSE 2-4 GM/100ML-% IV SOLN: 2.0000 g | Freq: Four times a day (QID) | INTRAVENOUS | Status: DC

## 2023-08-02 MED ORDER — GENTAMICIN SULFATE 40 MG/ML IJ SOLN
5.0000 mg/kg | INTRAVENOUS | Status: DC
  Filled 2023-08-02: qty 8.5

## 2023-08-02 MED ORDER — CEFAZOLIN SODIUM-DEXTROSE 2-3 GM-%(50ML) IV SOLR
INTRAVENOUS | Status: DC | PRN
  Administered 2023-08-02: 2 g via INTRAVENOUS

## 2023-08-02 MED ORDER — POVIDONE-IODINE 10 % EX SWAB: 2.0000 | Freq: Once | CUTANEOUS | Status: AC

## 2023-08-02 MED ORDER — ACETAMINOPHEN 500 MG PO TABS
1000.0000 mg | ORAL_TABLET | Freq: Once | ORAL | Status: AC
  Administered 2023-08-02: 1000 mg via ORAL
  Filled 2023-08-02: qty 2

## 2023-08-02 MED ORDER — CEFAZOLIN SODIUM 1 G IJ SOLR
INTRAMUSCULAR | Status: AC
Start: 2023-08-02 — End: 2023-08-02
  Filled 2023-08-02: qty 10

## 2023-08-02 MED ORDER — ONDANSETRON HCL 4 MG/2ML IJ SOLN
INTRAMUSCULAR | Status: DC | PRN
Start: 2023-08-02 — End: 2023-08-02
  Administered 2023-08-02: 4 mg via INTRAVENOUS

## 2023-08-02 SURGICAL SUPPLY — 50 items
ATTUNE PSFEM LTSZ5 NARCEM KNEE (Femur) IMPLANT
ATTUNE PSRP INSR SZ5 5 KNEE (Insert) IMPLANT
BAG COUNTER SPONGE SURGICOUNT (BAG) ×1 IMPLANT
BAG DECANTER FOR FLEXI CONT (MISCELLANEOUS) ×1 IMPLANT
BAG ZIPLOCK 12X15 (MISCELLANEOUS) ×1 IMPLANT
BASE TIBIA ATTUNE KNEE SYS SZ6 (Knees) IMPLANT
BLADE SAGITTAL 25.0X1.19X90 (BLADE) ×1 IMPLANT
BLADE SAW SGTL 11.0X1.19X90.0M (BLADE) ×1 IMPLANT
BLADE SURG SZ10 CARB STEEL (BLADE) ×1 IMPLANT
BNDG ELASTIC 6INX 5YD STR LF (GAUZE/BANDAGES/DRESSINGS) ×1 IMPLANT
BOOTIES KNEE HIGH SLOAN (MISCELLANEOUS) ×1 IMPLANT
BOWL SMART MIX CTS (DISPOSABLE) ×1 IMPLANT
CEMENT HV SMART SET (Cement) ×2 IMPLANT
COVER SURGICAL LIGHT HANDLE (MISCELLANEOUS) ×1 IMPLANT
CUFF TRNQT CYL 34X4.125X (TOURNIQUET CUFF) ×1 IMPLANT
DRAPE TOP 10253 STERILE (DRAPES) ×1 IMPLANT
DRAPE U-SHAPE 47X51 STRL (DRAPES) ×1 IMPLANT
DRSG AQUACEL AG ADV 3.5X10 (GAUZE/BANDAGES/DRESSINGS) ×1 IMPLANT
DURAPREP 26ML APPLICATOR (WOUND CARE) ×2 IMPLANT
ELECT PENCIL ROCKER SW 15FT (MISCELLANEOUS) ×1 IMPLANT
ELECT REM PT RETURN 15FT ADLT (MISCELLANEOUS) ×1 IMPLANT
GLOVE BIO SURGEON STRL SZ8 (GLOVE) ×2 IMPLANT
GLOVE BIOGEL PI IND STRL 7.0 (GLOVE) ×1 IMPLANT
GLOVE BIOGEL PI IND STRL 8 (GLOVE) ×2 IMPLANT
GLOVE SURG SYN 7.0 (GLOVE) ×1 IMPLANT
GLOVE SURG SYN 7.0 PF PI (GLOVE) ×1 IMPLANT
GOWN SRG XL LVL 4 BRTHBL STRL (GOWNS) ×1 IMPLANT
GOWN STRL REUS W/ TWL XL LVL3 (GOWN DISPOSABLE) ×2 IMPLANT
HOLDER FOLEY CATH W/STRAP (MISCELLANEOUS) IMPLANT
HOOD PEEL AWAY T7 (MISCELLANEOUS) ×3 IMPLANT
KIT TURNOVER KIT A (KITS) ×1 IMPLANT
MANIFOLD NEPTUNE II (INSTRUMENTS) ×1 IMPLANT
NS IRRIG 1000ML POUR BTL (IV SOLUTION) ×1 IMPLANT
PACK TOTAL KNEE CUSTOM (KITS) ×1 IMPLANT
PAD ARMBOARD POSITIONER FOAM (MISCELLANEOUS) ×1 IMPLANT
PATELLA MEDIAL ATTUN 35MM KNEE (Knees) IMPLANT
PIN STEINMAN FIXATION KNEE (PIN) IMPLANT
PROTECTOR NERVE ULNAR (MISCELLANEOUS) ×1 IMPLANT
SET HNDPC FAN SPRY TIP SCT (DISPOSABLE) ×1 IMPLANT
SPIKE FLUID TRANSFER (MISCELLANEOUS) ×2 IMPLANT
SUT ETHIBOND NAB CT1 #1 30IN (SUTURE) ×1 IMPLANT
SUT VIC AB 0 CT1 36 (SUTURE) ×2 IMPLANT
SUT VIC AB 2-0 CT1 TAPERPNT 27 (SUTURE) ×1 IMPLANT
SUT VICRYL AB 3-0 FS1 BRD 27IN (SUTURE) ×1 IMPLANT
SUTURE STRATFX 0 PDS 27 VIOLET (SUTURE) ×1 IMPLANT
TRAY FOLEY MTR SLVR 14FR STAT (SET/KITS/TRAYS/PACK) IMPLANT
TRAY FOLEY MTR SLVR 16FR STAT (SET/KITS/TRAYS/PACK) IMPLANT
WATER STERILE IRR 1000ML POUR (IV SOLUTION) ×2 IMPLANT
WRAP KNEE MAXI GEL POST OP (GAUZE/BANDAGES/DRESSINGS) ×1 IMPLANT
YANKAUER SUCT BULB TIP NO VENT (SUCTIONS) ×1 IMPLANT

## 2023-08-02 NOTE — Transfer of Care (Signed)
 Immediate Anesthesia Transfer of Care Note  Patient: Anita Arroyo  Procedure(s) Performed: ARTHROPLASTY, KNEE, TOTAL (Left: Knee)  Patient Location: PACU  Anesthesia Type:MAC combined with regional for post-op pain  Level of Consciousness: awake, alert , and oriented  Airway & Oxygen Therapy: Patient Spontanous Breathing and Patient connected to face mask oxygen  Post-op Assessment: Report given to RN and Post -op Vital signs reviewed and stable  Post vital signs: Reviewed and stable  Last Vitals:  Vitals Value Taken Time  BP 106/58 08/02/23 12:32  Temp    Pulse 62 08/02/23 12:34  Resp 18 08/02/23 12:34  SpO2 99 % 08/02/23 12:34  Vitals shown include unfiled device data.  Last Pain:  Vitals:   08/02/23 1015  TempSrc:   PainSc: 0-No pain      Patients Stated Pain Goal: 4 (08/02/23 9176)  Complications: No notable events documented.

## 2023-08-02 NOTE — Op Note (Signed)
 PREOP DIAGNOSIS: DJD LEFT KNEE POSTOP DIAGNOSIS:  same PROCEDURE: LEFT TKR ANESTHESIA: Spinal and MAC ATTENDING SURGEON: Maude KANDICE Herald ASSISTANT: Prentice Earl PA  INDICATIONS FOR PROCEDURE: Anita Arroyo is a 63 y.o. female who has struggled for a long time with pain due to degenerative arthritis of the left knee.  The patient has failed many conservative non-operative measures and at this point has pain which limits the ability to sleep and walk.  The patient is offered total knee replacement.  Informed operative consent was obtained after discussion of possible risks of anesthesia, infection, neurovascular injury, DVT, and death.  The importance of the post-operative rehabilitation protocol to optimize result was stressed extensively with the patient.  SUMMARY OF FINDINGS AND PROCEDURE:  Anita Arroyo was taken to the operative suite where under the above anesthesia a left knee replacement was performed.  There were advanced degenerative changes and the bone quality was fair.  We used the DePuyAttune system and placed size 5 narrow femur, 6 tibia, 35 mm all polyethylene patella, and a size 5 mm spacer.  Prentice Earl PA-C assisted throughout and was invaluable to the completion of the case in that he helped retract and maintain exposure while I placed the components.  He also helped close thereby minimizing OR time.  The patient was admitted for appropriate post-op care to include perioperative antibiotics and mechanical and pharmacologic measures for DVT prophylaxis.  DESCRIPTION OF PROCEDURE:  Anita Arroyo was taken to the operative suite where the above anesthesia was applied.  The patient was positioned supine and prepped and draped in normal sterile fashion.  An appropriate time out was performed.  After the administration of kefzol  and vancomycin  pre-op antibiotic the leg was elevated and exsanguinated and a tourniquet inflated.  A standard longitudinal incision was made on the anterior knee.   Dissection was carried down to the extensor mechanism.  All appropriate anti-infective measures were used including the pre-operative antibiotic, betadine  impregnated drape, and closed hooded exhaust systems for each member of the surgical team.  A medial parapatellar incision was made in the extensor mechanism and the knee cap flipped and the knee flexed.  Some residual meniscal tissues were removed along with any remaining ACL/PCL tissue.  A guide was placed on the tibia and a flat cut was made on it's superior surface.  An intramedullary guide was placed in the femur and was utilized to make anterior and posterior cuts creating an appropriate flexion gap.  A second intramedullary guide was placed in the femur to make a distal cut properly balancing the knee with an extension gap equal to the flexion gap.  The three bones sized to the above mentioned sizes and the appropriate guides were placed and utilized.  A trial reduction was done and the knee easily came to full extension and the patella tracked well on flexion.  The trial components were removed and all bones were cleaned with pulsatile lavage and then dried thoroughly.  Cement was mixed and was pressurized onto the bones followed by placement of the aforementioned components.  Excess cement was trimmed and pressure was held on the components until the cement had hardened.  The tourniquet was deflated and a small amount of bleeding was controlled with cautery and pressure.  The knee was irrigated thoroughly.  The extensor mechanism was re-approximated with #1 ethibond in interrupted fashion.  The knee was flexed and the repair was solid.  The subcutaneous tissues were re-approximated with #0 and #2-0 vicryl  and the skin closed with a subcuticular stitch and steristrips.  A sterile dressing was applied.  Intraoperative fluids, EBL, and tourniquet time can be obtained from anesthesia records.  DISPOSITION:  The patient was taken to recovery room in stable  condition and scheduled to potentially go home same day depending on ability to walk and tolerate liquids.  Tamarion Haymond G Yunis Voorheis 08/02/2023, 11:59 AM

## 2023-08-02 NOTE — Interval H&P Note (Signed)
 History and Physical Interval Note:  08/02/2023 9:20 AM  Anita Arroyo  has presented today for surgery, with the diagnosis of LEFT KNEE DEGENERATIVE JOINT DISEASE.  The various methods of treatment have been discussed with the patient and family. After consideration of risks, benefits and other options for treatment, the patient has consented to  Procedure(s) with comments: ARTHROPLASTY, KNEE, TOTAL (Left) - LEFT TOTAL KNEE ARTHOPLASTY as a surgical intervention.  The patient's history has been reviewed, patient examined, no change in status, stable for surgery.  I have reviewed the patient's chart and labs.  Questions were answered to the patient's satisfaction.     Damali Broadfoot G Alyus Mofield

## 2023-08-02 NOTE — Anesthesia Procedure Notes (Signed)
 Spinal  Patient location during procedure: OR Start time: 08/02/2023 10:37 AM End time: 08/02/2023 10:39 AM Reason for block: surgical anesthesia Staffing Performed: resident/CRNA  Resident/CRNA: [place], Kiyona Mcnall C, CRNA Performed by: [place], Corean BROCKS, CRNA Authorized by: Patrisha Bernardino SQUIBB, MD   Preanesthetic Checklist Completed: patient identified, IV checked, site marked, risks and benefits discussed, surgical consent, monitors and equipment checked, pre-op evaluation and timeout performed Spinal Block Patient position: sitting Prep: DuraPrep and site prepped and draped Patient monitoring: heart rate, cardiac monitor, continuous pulse ox and blood pressure Approach: midline Location: L3-4 Injection technique: single-shot Needle Needle type: Pencan  Needle gauge: 24 G Needle length: 9 cm Assessment Sensory level: T4 Events: CSF return Additional Notes IV functioning, monitors applied to pt. Expiration date of kit checked and confirmed to be in date. Sterile prep and drape, hand hygiene and sterile gloved used. Pt was positioned and spine was prepped in sterile fashion. Skin was anesthetized with lidocaine . Free flow of clear CSF obtained prior to injecting local anesthetic into CSF x 1 attempt. Spinal needle aspirated freely following injection. Needle was carefully withdrawn, and pt tolerated procedure well. Loss of motor and sensory on exam post injection.

## 2023-08-02 NOTE — Anesthesia Postprocedure Evaluation (Signed)
 Anesthesia Post Note  Patient: Anita Arroyo  Procedure(s) Performed: ARTHROPLASTY, KNEE, TOTAL (Left: Knee)     Patient location during evaluation: PACU Anesthesia Type: Regional and Spinal Level of consciousness: awake Pain management: pain level controlled Vital Signs Assessment: post-procedure vital signs reviewed and stable Respiratory status: spontaneous breathing, nonlabored ventilation and respiratory function stable Cardiovascular status: blood pressure returned to baseline and stable Postop Assessment: no apparent nausea or vomiting Anesthetic complications: no   No notable events documented.  Last Vitals:  Vitals:   08/02/23 1608 08/02/23 1800  BP: 125/70 128/72  Pulse: 72 72  Resp: 20 (!) 22  Temp: 36.7 C   SpO2: 94% 96%    Last Pain:  Vitals:   08/02/23 1608  TempSrc: Oral  PainSc: 0-No pain                 Arlette Schaad P Eisa Necaise

## 2023-08-02 NOTE — Evaluation (Signed)
 Physical Therapy Evaluation Patient Details Name: Anita Arroyo MRN: [id number] DOB: 06-20-1960 Today's Date: 08/02/2023  History of Present Illness  63 yo female presents to therapy s/p L TKA on 08/02/2023 due to failure of conservative measures. Pt PMH includes but is not limited to: neck pain, lung nodules, HOH, HA, DOE, meniere's dz, OA, HLD, depression, HTN, COPD, GERD, anxiety, bipolar disorder, fibromyalgia, R TSA 12/2022, and L TSA (2017).  Clinical Impression      ANALIA ZUK is a 63 y.o. female POD 0 s/p L TKA. Patient reports IND with mobility at baseline. Patient is now limited by functional impairments (see PT problem list below) and requires CGA for transfers and gait with RW. Patient was able to ambulate CGA and cues feet with RW and 45 and cues for safe walker management. Patient educated on safe sequencing for functional mobility tasks, fall risk prevention, pain goal and management, use of CP/ice, car transfers pt and mother verbalized understanding of safe guarding position for people assisting with mobility. Patient instructed in exercises to facilitate ROM and circulation reviewed and HO provided. Patient will benefit from continued skilled PT interventions to address impairments and progress towards PLOF. Patient has met mobility goals at adequate level for discharge home with family support and OPPT services; will continue to follow if pt continues acute stay to progress towards Mod I goals.     If plan is discharge home, recommend the following: A little help with walking and/or transfers;A little help with bathing/dressing/bathroom;Assistance with cooking/housework;Assist for transportation;Help with stairs or ramp for entrance   Can travel by private vehicle        Equipment Recommendations Rolling walker (2 wheels)  Recommendations for Other Services       Functional Status Assessment Patient has had a recent decline in their functional status and demonstrates the ability  to make significant improvements in function in a reasonable and predictable amount of time.     Precautions / Restrictions Precautions Precautions: Fall;Knee Restrictions Weight Bearing Restrictions Per Provider Order: No      Mobility  Bed Mobility Overal bed mobility: Needs Assistance Bed Mobility: Supine to Sit     Supine to sit: Contact guard, HOB elevated     General bed mobility comments: min cues    Transfers Overall transfer level: Needs assistance Equipment used: Rolling walker (2 wheels) Transfers: Sit to/from Stand Sit to Stand: Contact guard assist           General transfer comment: min cues for safety and proper RW use for bed, commode and recliner transfers    Ambulation/Gait Ambulation/Gait assistance: Contact guard assist Gait Distance (Feet): 45 Feet Assistive device: Rolling walker (2 wheels) Gait Pattern/deviations: Step-to pattern, Antalgic, Decreased stance time - left, Trunk flexed       General Gait Details: decreased  Stairs            Wheelchair Mobility     Tilt Bed    Modified Rankin (Stroke Patients Only)       Balance Overall balance assessment: Needs assistance Sitting-balance support: Feet supported Sitting balance-Leahy Scale: Good     Standing balance support: Bilateral upper extremity supported, During functional activity, Reliant on assistive device for balance Standing balance-Leahy Scale: Fair Standing balance comment: static standing no UE support                             Pertinent Vitals/Pain Pain Assessment Pain Assessment: 0-10  Pain Score: 3  Pain Location: L knee and LE, R shoulder Pain Descriptors / Indicators: Aching, Constant, Grimacing, Guarding, Discomfort Pain Intervention(s): Limited activity within patient's tolerance, Monitored during session, Premedicated before session, Repositioned, Ice applied    Home Living Family/patient expects to be discharged to:: Private  residence Living Arrangements: Alone Available Help at Discharge: Family;Available 24 hours/day Type of Home: House Home Access: Stairs to enter Entrance Stairs-Rails: Right Entrance Stairs-Number of Steps: 3   Home Layout: One level Home Equipment: Shower seat Additional Comments: pt is to d/c to mothers home, ILF aparment elevator to access and one level no steps to enter, handicap accessable bathroom    Prior Function Prior Level of Function : Independent/Modified Independent;Driving             Mobility Comments: IND no AD for all ADLs, self care tasks and IADLs       Extremity/Trunk Assessment        Lower Extremity Assessment Lower Extremity Assessment: LLE deficits/detail    Cervical / Trunk Assessment Cervical / Trunk Assessment: Normal  Communication   Communication Communication: No apparent difficulties    Cognition Arousal: Alert Behavior During Therapy: WFL for tasks assessed/performed   PT - Cognitive impairments: No apparent impairments                         Following commands: Intact       Cueing       General Comments      Exercises Total Joint Exercises Ankle Circles/Pumps: AROM, Both, 10 reps Quad Sets: AROM, Left, 5 reps Short Arc Quad: AROM, Right, 5 reps Heel Slides: AROM, Left, 5 reps Hip ABduction/ADduction: AROM, Left, 5 reps Straight Leg Raises: AROM, Left, 5 reps Knee Flexion: AROM, Left, 5 reps, Seated   Assessment/Plan    PT Assessment Patient needs continued PT services  PT Problem List Decreased strength;Decreased range of motion;Decreased activity tolerance;Decreased balance;Decreased mobility;Pain       PT Treatment Interventions DME instruction;Gait training;Stair training;Functional mobility training;Therapeutic activities;Therapeutic exercise;Balance training;Neuromuscular re-education;Patient/family education;Modalities    PT Goals (Current goals can be found in the Care Plan section)  Acute  Rehab PT Goals Patient Stated Goal: to be able to move without pain PT Goal Formulation: With patient Time For Goal Achievement: 08/16/23 Potential to Achieve Goals: Good    Frequency 7X/week     Co-evaluation               AM-PAC PT 6 Clicks Mobility  Outcome Measure Help needed turning from your back to your side while in a flat bed without using bedrails?: A Little Help needed moving from lying on your back to sitting on the side of a flat bed without using bedrails?: A Little Help needed moving to and from a bed to a chair (including a wheelchair)?: A Little Help needed standing up from a chair using your arms (e.g., wheelchair or bedside chair)?: A Little Help needed to walk in hospital room?: A Little Help needed climbing 3-5 steps with a railing? : A Lot 6 Click Score: 17    End of Session Equipment Utilized During Treatment: Gait belt Activity Tolerance: Patient tolerated treatment well;No increased pain Patient left: in chair;with call bell/phone within reach;with family/visitor present Nurse Communication: Mobility status;Other (comment) (pt readiness for d/c from PT standpoint) PT Visit Diagnosis: Unsteadiness on feet (R26.81);Other abnormalities of gait and mobility (R26.89);Muscle weakness (generalized) (M62.81);Difficulty in walking, not elsewhere classified (R26.2);Pain Pain - Right/Left:  Left Pain - part of body: Knee;Leg (R shoulder)    Time: 8352-8273 PT Time Calculation (min) (ACUTE ONLY): 39 min   Charges:   PT Evaluation $PT Eval Low Complexity: 1 Low PT Treatments $Gait Training: 8-22 mins $Therapeutic Exercise: 8-22 mins PT General Charges $$ ACUTE PT VISIT: 1 Visit       [place], PT Acute Rehab     [place] VEAR Drone 08/02/2023, 5:52 PM

## 2023-08-02 NOTE — Anesthesia Preprocedure Evaluation (Addendum)
 Anesthesia Evaluation  Patient identified by MRN, date of birth, ID band Patient awake    Reviewed: Allergy & Precautions, NPO status , Patient's Chart, lab work & pertinent test results  Airway Mallampati: II  TM Distance: >3 FB Neck ROM: Full    Dental no notable dental hx.    Pulmonary asthma , COPD,  COPD inhaler, former smoker   Pulmonary exam normal        Cardiovascular hypertension, Pt. on home beta blockers and Pt. on medications Normal cardiovascular exam     Neuro/Psych  Headaches PSYCHIATRIC DISORDERS Anxiety Depression Bipolar Disorder      GI/Hepatic ,GERD  Medicated and Controlled,,(+)     substance abuse    Endo/Other  negative endocrine ROS    Renal/GU Renal InsufficiencyRenal disease     Musculoskeletal  (+) Arthritis ,  Fibromyalgia -, narcotic dependentChronic pain syndrome Chronic prescription opiate use     Abdominal  (+) + obese  Peds  Hematology negative hematology ROS (+) PLT: 237k   Anesthesia Other Findings LEFT KNEE DEGENERATIVE JOINT DISEASE  Reproductive/Obstetrics                              Anesthesia Physical Anesthesia Plan  ASA: 2  Anesthesia Plan: Spinal and Regional   Post-op Pain Management: Regional block*   Induction:   PONV Risk Score and Plan: 2 and Ondansetron , Dexamethasone , Propofol  infusion, Midazolam  and Treatment may vary due to age or medical condition  Airway Management Planned: Simple Face Mask  Additional Equipment:   Intra-op Plan:   Post-operative Plan:   Informed Consent: I have reviewed the patients History and Physical, chart, labs and discussed the procedure including the risks, benefits and alternatives for the proposed anesthesia with the patient or authorized representative who has indicated his/her understanding and acceptance.     Dental advisory given  Plan Discussed with: CRNA  Anesthesia Plan  Comments:         Anesthesia Quick Evaluation

## 2023-08-02 NOTE — Discharge Instructions (Signed)
AMBULATORY SURGERY  ?DISCHARGE INSTRUCTIONS ? ? ?The drugs that you were given will stay in your system until tomorrow so for the next 24 hours you should not: ? ?Drive an automobile ?Make any legal decisions ?Drink any alcoholic beverage ? ? ?You may resume regular meals tomorrow.  Today it is better to start with liquids and gradually work up to solid foods. ? ?You may eat anything you prefer, but it is better to start with liquids, then soup and crackers, and gradually work up to solid foods. ? ? ?Please notify your doctor immediately if you have any unusual bleeding, trouble breathing, redness and pain at the surgery site, drainage, fever, or pain not relieved by medication. ? ? ? ?Additional Instructions: ? ? ? ?Please contact your physician with any problems or Same Day Surgery at [phone], Monday through Friday 6 am to 4 pm, or  [facility] Main number at [phone].  ?

## 2023-08-02 NOTE — Anesthesia Procedure Notes (Signed)
 Anesthesia Regional Block: Adductor canal block   Pre-Anesthetic Checklist: , timeout performed,  Correct Patient, Correct Site, Correct Laterality,  Correct Procedure,, site marked,  Risks and benefits discussed,  Surgical consent,  Pre-op evaluation,  At surgeon's request and post-op pain management  Laterality: Left  Prep: chloraprep       Needles:  Injection technique: Single-shot  Needle Type: Echogenic Stimulator Needle     Needle Length: 10cm  Needle Gauge: 20     Additional Needles:   Procedures:,,,, ultrasound used (permanent image in chart),,    Narrative:  Start time: 08/02/2023 9:45 AM End time: 08/02/2023 9:55 AM Injection made incrementally with aspirations every 5 mL.  Performed by: Personally  Anesthesiologist: Patrisha Bernardino SQUIBB, MD  Additional Notes: Functioning IV was confirmed and monitors were applied. A time-out was performed. Hand hygiene and sterile gloves were used. The thigh was placed in a frog-leg position and prepped in a sterile fashion. A 20ga Bbraun echogenic stimulator needle was placed using ultrasound guidance.  Negative aspiration and negative test dose prior to incremental administration of local anesthetic. The patient tolerated the procedure well.

## 2023-08-04 ENCOUNTER — Encounter (HOSPITAL_COMMUNITY): Payer: Self-pay | Admitting: Orthopaedic Surgery

## 2023-08-05 NOTE — Telephone Encounter (Signed)
 I never received any request on this pt and it appears that her surgery in question was already completed  Closing out this encounter

## 2023-08-22 ENCOUNTER — Encounter: Payer: Self-pay | Admitting: Acute Care

## 2023-09-21 ENCOUNTER — Ambulatory Visit
Admission: RE | Admit: 2023-09-21 | Discharge: 2023-09-21 | Disposition: A | Discharge status: Home / Self Care | Source: Ambulatory Visit | Attending: Acute Care | Admitting: Acute Care

## 2023-09-21 DIAGNOSIS — Z87891 Personal history of nicotine dependence: Secondary | ICD-10-CM

## 2023-09-21 DIAGNOSIS — F17210 Nicotine dependence, cigarettes, uncomplicated: Secondary | ICD-10-CM

## 2023-09-21 DIAGNOSIS — Z122 Encounter for screening for malignant neoplasm of respiratory organs: Secondary | ICD-10-CM

## 2023-10-12 ENCOUNTER — Other Ambulatory Visit: Payer: Self-pay | Admitting: Acute Care

## 2023-10-12 DIAGNOSIS — Z87891 Personal history of nicotine dependence: Secondary | ICD-10-CM

## 2023-10-12 DIAGNOSIS — Z122 Encounter for screening for malignant neoplasm of respiratory organs: Secondary | ICD-10-CM

## 2023-10-12 DIAGNOSIS — F17210 Nicotine dependence, cigarettes, uncomplicated: Secondary | ICD-10-CM

## 2023-10-26 ENCOUNTER — Other Ambulatory Visit: Payer: Self-pay | Admitting: Primary Care

## 2023-10-26 DIAGNOSIS — J449 Chronic obstructive pulmonary disease, unspecified: Secondary | ICD-10-CM

## 2023-10-26 DIAGNOSIS — Z79899 Other long term (current) drug therapy: Secondary | ICD-10-CM

## 2023-10-26 NOTE — Telephone Encounter (Signed)
 Courtesy refill - will need appt for further refills.

## 2023-11-03 NOTE — Progress Notes (Signed)
 This encounter was created in error - please disregard.

## 2023-11-04 ENCOUNTER — Other Ambulatory Visit: Payer: Self-pay | Admitting: Obstetrics

## 2023-11-04 DIAGNOSIS — R928 Other abnormal and inconclusive findings on diagnostic imaging of breast: Secondary | ICD-10-CM

## 2023-11-11 ENCOUNTER — Ambulatory Visit
Admission: RE | Admit: 2023-11-11 | Discharge: 2023-11-11 | Disposition: A | Discharge status: Home / Self Care | Source: Ambulatory Visit | Attending: Obstetrics | Admitting: Obstetrics

## 2023-11-11 ENCOUNTER — Ambulatory Visit

## 2023-11-11 DIAGNOSIS — R928 Other abnormal and inconclusive findings on diagnostic imaging of breast: Secondary | ICD-10-CM

## 2023-11-29 ENCOUNTER — Encounter: Payer: Self-pay | Admitting: Nurse Practitioner

## 2023-11-29 ENCOUNTER — Ambulatory Visit: Attending: Nurse Practitioner | Admitting: Nurse Practitioner

## 2023-11-29 VITALS — BP 123/69 | HR 68 | Ht 62.5 in | Wt 213.0 lb

## 2023-11-29 DIAGNOSIS — I700 Atherosclerosis of aorta: Secondary | ICD-10-CM

## 2023-11-29 DIAGNOSIS — R7303 Prediabetes: Secondary | ICD-10-CM

## 2023-11-29 DIAGNOSIS — I2510 Atherosclerotic heart disease of native coronary artery without angina pectoris: Secondary | ICD-10-CM | POA: Diagnosis not present

## 2023-11-29 DIAGNOSIS — E785 Hyperlipidemia, unspecified: Secondary | ICD-10-CM

## 2023-11-29 DIAGNOSIS — R002 Palpitations: Secondary | ICD-10-CM

## 2023-11-29 DIAGNOSIS — J449 Chronic obstructive pulmonary disease, unspecified: Secondary | ICD-10-CM

## 2023-11-29 DIAGNOSIS — Z720 Tobacco use: Secondary | ICD-10-CM

## 2023-11-29 MED ORDER — REPATHA SURECLICK 140 MG/ML ~~LOC~~ SOAJ: 140.0000 mg | SUBCUTANEOUS | 3 refills | Status: DC

## 2023-11-29 NOTE — Progress Notes (Unsigned)
 Office Visit    Patient Name: Anita Arroyo Date of Encounter: 11/29/2023  Primary Care Provider:  Juliane Che, PA Primary Cardiologist:  Oneil Parchment, MD  Chief Complaint    63 year old female with a history of coronary artery calcification noted on prior CT, aortic atherosclerosis, palpitations, hypertension, hyperlipidemia, prediabetes, breast cancer, COPD, former tobacco use, GERD, arthritis, anxiety, bipolar disorder, and fibromyalgia who presents for follow-up related to coronary artery calcification.  Past Medical History    Past Medical History:  Diagnosis Date   Allergy    pollen and environmental   Anxiety    Arthritis    Asthma    Bipolar 1 disorder (HCC)    Breast cancer (HCC)    Cataract    COPD (chronic obstructive pulmonary disease) (HCC)    Depression    Fibromyalgia    Finger fracture 2011   GERD (gastroesophageal reflux disease)    Heart murmur    History of stress test 04/28/2011   showed normal perfusion without scar or ischemia   Hx of echocardiogram 04/28/2011   showed normal systolic function with mild diastolic dysfunction,she had trace MR but did not have frank mitral valve prolapse demonstrated. She had mild pulmonary hypertension with an estimated RV systolic pressure at 34 mm.   Hyperlipemia    Meniere disease    Pre-diabetes    Past Surgical History:  Procedure Laterality Date   ANKLE SURGERY     left x4   BREAST LUMPECTOMY Right 2011   BREAST SURGERY  2011   lumpectomy right breast    COLONOSCOPY     COLONOSCOPY WITH PROPOFOL  N/A 02/25/2014   Procedure: COLONOSCOPY WITH PROPOFOL ;  Surgeon: Gladis MARLA Louder, MD;  Location: [facility];  Service: Endoscopy;  Laterality: N/A;   deviated septum     ECTOPIC PREGNANCY SURGERY     ELBOW SURGERY     right elbow   EYE SURGERY Bilateral    cataract surgery   fibroid     2-3 fibroid adenomas removed   HAND SURGERY Left 2020   fall from Dog, pinkie finger, ring finger, middle finger    JOINT REPLACEMENT  2011   right knee    KNEE SURGERY     left knee   POLYPECTOMY     REVERSE SHOULDER ARTHROPLASTY Right 12/30/2022   Procedure: REVERSE SHOULDER ARTHROPLASTY;  Surgeon: Dozier Soulier, MD;  Location: [facility];  Service: Orthopedics;  Laterality: Right;   SHOULDER ARTHROSCOPY WITH BICEPSTENOTOMY Right 09/25/2015   Procedure: SHOULDER ARTHROSCOPY WITH BICEPSTENOTOMY;  Surgeon: Toribio JULIANNA Chancy, MD;  Location: [facility];  Service: Orthopedics;  Laterality: Right;   SHOULDER ARTHROSCOPY WITH DISTAL CLAVICLE RESECTION Right 09/25/2015   Procedure: SHOULDER ARTHROSCOPY WITH DISTAL CLAVICLE RESECTION;  Surgeon: Toribio JULIANNA Chancy, MD;  Location: [facility];  Service: Orthopedics;  Laterality: Right;   SHOULDER ARTHROSCOPY WITH SUBACROMIAL DECOMPRESSION Right 09/25/2015   Procedure: RIGHT SHOULDER ARTHROSCOPY DEBRIDEMENT,ACROMIOPLASTY,DISTAL CLAVICAL EXCISION, RELEASE OF BICEPS TENDON;  Surgeon: Toribio JULIANNA Chancy, MD;  Location:  SURGERY CENTER;  Service: Orthopedics;  Laterality: Right;   TMJ ARTHROPLASTY     TONSILLECTOMY     TOTAL KNEE ARTHROPLASTY Left 08/02/2023   Procedure: ARTHROPLASTY, KNEE, TOTAL;  Surgeon: Sheril Coy, MD;  Location: [facility];  Service: Orthopedics;  Laterality: Left;  LEFT TOTAL KNEE ARTHOPLASTY   TOTAL SHOULDER ARTHROPLASTY Left 01/05/2016   UTERINE FIBROID SURGERY     polups and fibroids removed    WRIST SURGERY  left    Allergies  Allergies  Allergen Reactions   Contrast Media [Iodinated Contrast Media] Anaphylaxis    Anaphylactic shock.   Celebrex [Celecoxib] Other (See Comments)    I climb the walls   Statins Other (See Comments)    Body aches, constipation AND DEPRESSION   Sulfa Antibiotics Nausea And Vomiting    Other Reaction(s): GI Intolerance   Zetia  [Ezetimibe ] Other (See Comments)    I can't take any cholesterol medicines.  Other Reaction(s): Other (See Comments)   Augmentin  [Amoxicillin-Pot Clavulanate] Rash and Other (See Comments)   Ceclor [Cefaclor] Rash   Moxifloxacin Rash   Sulfonamide Derivatives Nausea And Vomiting     Labs/Other Studies Reviewed    The following studies were reviewed today:  Cardiac Studies & Procedures   ______________________________________________________________________________________________   STRESS TESTS  MYOCARDIAL PERFUSION IMAGING 08/12/2016  Interpretation Summary  The left ventricular ejection fraction is normal (55-65%).  Nuclear stress EF: 58%.  There was no ST segment deviation noted during stress.  No T wave inversion was noted during stress.  The study is normal.  This is a low risk study.   ECHOCARDIOGRAM  ECHOCARDIOGRAM COMPLETE 07/08/2016  Narrative *[facility]* [address] [place], [place] [address] [phone]  ------------------------------------------------------------------- Transthoracic Echocardiography  Patient:    Kourtnie, Sachs MR #:       [id number] Study Date: 07/08/2016 Gender:     F Age:        70 Height:     170.2 cm Weight:     76.2 kg BSA:        1.91 m^2 Pt. Status: Room:  ORDERING     Debby Sor, M.D. REFERRING    Debby Sor, M.D. ATTENDING    Vina Gull, M.D. SONOGRAPHER  [place], RDCS REFERRING    Juliane, Chelle S PERFORMING   [facility], Outpatient  cc:  ------------------------------------------------------------------- LV EF: 60% -   65%  ------------------------------------------------------------------- Indications:      Shortness of breath (R06.02).  ------------------------------------------------------------------- History:   PMH:   Chronic obstructive pulmonary disease.  Risk factors:  Palpitation. Current tobacco use. Hypertension. Dyslipidemia.  ------------------------------------------------------------------- Study Conclusions  - Left ventricle: The cavity size was normal. Wall thickness was normal. Systolic  function was normal. The estimated ejection fraction was in the range of 60% to 65%. Doppler parameters are consistent with abnormal left ventricular relaxation (grade 1 diastolic dysfunction).  ------------------------------------------------------------------- Study data:  No prior study was available for comparison.  Study status:  Routine.  Procedure:  The patient reported no pain pre or post test. Transthoracic echocardiography. Image quality was adequate.  Study completion:  There were no complications. Transthoracic echocardiography.  M-mode, complete 2D, 3D, spectral Doppler, and color Doppler.  Birthdate:  Patient birthdate: 25-Sep-1960.  Age:  Patient is 63 yr old.  Sex:  Gender: female. BMI: 26.3 kg/m^2.  Blood pressure:     106/82  Patient status: Outpatient.  Study date:  Study date: 07/08/2016. Study time: 02:58 PM.  Location:  [facility]  -------------------------------------------------------------------  ------------------------------------------------------------------- Left ventricle:  The cavity size was normal. Wall thickness was normal. Systolic function was normal. The estimated ejection fraction was in the range of 60% to 65%. The transmitral flow pattern was normal. The deceleration time of the early transmitral flow velocity was normal. The pulmonary vein flow pattern was normal. The tissue Doppler parameters were normal. Doppler parameters are consistent with abnormal left ventricular relaxation (grade 1 diastolic dysfunction).  ------------------------------------------------------------------- Aortic valve:  Mildly thickened leaflets.  Doppler:  There was no regurgitation.  ------------------------------------------------------------------- Mitral valve:   Mildly thickened leaflets .  Doppler:  There was trivial regurgitation.    Valve area by pressure half-time: 2.72 cm^2. Indexed valve area by pressure half-time: 1.42  cm^2/m^2.  ------------------------------------------------------------------- Left atrium:  The atrium was normal in size.  ------------------------------------------------------------------- Right ventricle:  The cavity size was normal. Wall thickness was normal. Systolic function was normal.  ------------------------------------------------------------------- Tricuspid valve:   Structurally normal valve.   Leaflet separation was normal.  Doppler:  Transvalvular velocity was within the normal range. There was no regurgitation.  ------------------------------------------------------------------- Right atrium:  The atrium was normal in size.  ------------------------------------------------------------------- Pericardium:  There was no pericardial effusion.  ------------------------------------------------------------------- Systemic veins: Inferior vena cava: The vessel was normal in size. The respirophasic diameter changes were in the normal range (>= 50%), consistent with normal central venous pressure.  ------------------------------------------------------------------- Measurements  Left ventricle                         Value          Reference LV ID, ED, PLAX chordal                52    mm       43 - 52 LV ID, ES, PLAX chordal                35    mm       23 - 38 LV fx shortening, PLAX chordal         33    %        >=29 LV PW thickness, ED                    6     mm       --------- IVS/LV PW ratio, ED                    1.17           <=1.3 Stroke volume, 2D                      71    ml       --------- Stroke volume/bsa, 2D                  37    ml/m^2   --------- LV e&', lateral                         10.4  cm/s     --------- LV E/e&', lateral                       6.06           --------- LV e&', medial                          5.59  cm/s     --------- LV E/e&', medial                        11.27          --------- LV e&', average                         8  cm/s     --------- LV E/e&', average                       7.88           ---------  Ventricular septum                     Value          Reference IVS thickness, ED                      7     mm       ---------  LVOT                                   Value          Reference LVOT ID, S                             17    mm       --------- LVOT area                              2.27  cm^2     --------- LVOT peak velocity, S                  136   cm/s     --------- LVOT mean velocity, S                  85.3  cm/s     --------- LVOT VTI, S                            31.1  cm       --------- LVOT peak gradient, S                  7     mm Hg    ---------  Aorta                                  Value          Reference Aortic root ID, ED                     26    mm       --------- Ascending aorta ID, A-P, S             27    mm       ---------  Left atrium                            Value          Reference LA ID, A-P, ES                         38    mm       --------- LA ID/bsa, A-P                         1.99  cm/m^2   <=2.2 LA  volume, S                           54    ml       --------- LA volume/bsa, S                       28.2  ml/m^2   --------- LA volume, ES, 1-p A4C                 50.5  ml       --------- LA volume/bsa, ES, 1-p A4C             26.4  ml/m^2   --------- LA volume, ES, 1-p A2C                 57.2  ml       --------- LA volume/bsa, ES, 1-p A2C             29.9  ml/m^2   ---------  Mitral valve                           Value          Reference Mitral E-wave peak velocity            63    cm/s     --------- Mitral A-wave peak velocity            50.5  cm/s     --------- Mitral deceleration time       (H)     278   ms       150 - 230 Mitral pressure half-time              81    ms       --------- Mitral E/A ratio, peak                 1.2            --------- Mitral valve area, PHT, DP             2.72  cm^2     --------- Mitral valve area/bsa, PHT, DP          1.42  cm^2/m^2 ---------  Systemic veins                         Value          Reference Estimated CVP                          3     mm Hg    ---------  Right ventricle                        Value          Reference TAPSE                                  22.6  mm       --------- RV s&', lateral, S                      9.73  cm/s     ---------  Legend: (L)  and  (H)  mark values outside specified  reference range.  ------------------------------------------------------------------- Prepared and Electronically Authenticated by  Vina Gull, M.D. 2018-06-16T07:35:48          ______________________________________________________________________________________________     Recent Labs: 12/20/2022: ALT 30 07/26/2023: BUN 25; Creatinine, Ser 1.28; Hemoglobin 13.0; Platelets 237; Potassium 3.7; Sodium 137  Recent Lipid Panel    Component Value Date/Time   CHOL 235 (H) 11/25/2017 1155   TRIG 113 11/25/2017 1155   HDL 70 11/25/2017 1155   CHOLHDL 3.4 11/25/2017 1155   CHOLHDL 2.9 11/22/2013 0904   VLDL 16 11/22/2013 0904   LDLCALC 142 (H) 11/25/2017 1155    History of Present Illness    62 year old female with the above past medical history including coronary artery calcification noted on prior CT, aortic atherosclerosis, palpitations, hypertension, hyperlipidemia, prediabetes, breast cancer, COPD, former tobacco use, GERD, arthritis, anxiety, bipolar disorder, and fibromyalgia.  She has a history of coronary artery calcification, aortic atherosclerosis noted on prior CT imaging. Myoview  in 2018 showed EF 55 to 65%, no evidence of ischemia or infarction, low risk.  She has a history of palpitations, well-managed on atenolol .  Echocardiogram in 06/2016 showed EF 60 to 65%, normal LV function, no RWMA, G1 DD, no significant valvular abnormalities. She was last seen in the office on 05/07/2022 and was stable from a cardiac standpoint.  She denied symptoms concerning for angina.   She  presents today for follow-up.  Since her last visit she has done well from a cardiac standpoint.  She denies any symptoms concerning for angina, denies any significant palpitations. She has quit smoking. Overall, she reports feeling well.  Home Medications    Current Outpatient Medications  Medication Sig Dispense Refill   albuterol  (VENTOLIN  HFA) 108 (90 Base) MCG/ACT inhaler Inhale 2 puffs into the lungs every 4 (four) hours as needed for wheezing or shortness of breath (cough, shortness of breath or wheezing.). 8 g 1   ARIPiprazole  (ABILIFY ) 5 MG tablet Take 1 tablet (5 mg total) by mouth daily. 90 tablet 1   aspirin  EC 81 MG tablet Take 1 tablet (81 mg total) by mouth 2 (two) times daily. For 2 weeks then back to once a day for DVT prevention. 30 tablet 0   atenolol  (TENORMIN ) 50 MG tablet Take 50 mg in the morning and 25 mg in the evening 135 tablet 1   BELSOMRA 15 MG TABS Take 15 mg by mouth at bedtime.     Biotin 89999 MCG TABS Take 1 tablet by mouth daily.     CALCIUM  CARBONATE-VITAMIN D  PO Take 1 tablet by mouth daily.     cetirizine (ZYRTEC) 10 MG tablet Take 10 mg by mouth daily.     diclofenac sodium (VOLTAREN) 1 % GEL Apply 1 Application topically daily as needed (pain).     fluticasone  (CUTIVATE ) 0.05 % cream Apply 1 Application topically 2 (two) times daily as needed (irritation).     fluticasone  (FLONASE ) 50 MCG/ACT nasal spray USE 1 SPRAY IN EACH NOSTRIL TWICE DAILY 48 g 1   Fluticasone -Salmeterol 113-14 MCG/ACT AEPB INHALE 1 PUFF BY MOUTH 2 TIMES DAILY 1 each 0   HYDROcodone -acetaminophen  (NORCO/VICODIN) 5-325 MG tablet Take 1 tablet by mouth 4 (four) times daily.     JARDIANCE 10 MG TABS tablet Take 10 mg by mouth daily.     L-Methylfolate 15 MG TABS Take 1 tablet (15 mg total) by mouth daily. 90 tablet 1   lamoTRIgine  (LAMICTAL ) 200 MG tablet TAKE 1/2 TABLET EVERY MORNING  AND TAKE 2 TABLETS AT  BEDTIME (Patient taking differently: Take 450 mg by mouth at bedtime.) 225 tablet  3   LUTEIN PO Take 2 mg by mouth daily.     Magnesium 500 MG TABS Take 500 mg by mouth daily.     meclizine  (ANTIVERT ) 25 MG tablet Take 1 tablet (25 mg total) by mouth as needed for dizziness. 30 tablet 1   montelukast  (SINGULAIR ) 10 MG tablet Take 1 tablet (10 mg total) by mouth at bedtime. 90 tablet 3   pantoprazole  (PROTONIX ) 40 MG tablet Take 1 tablet (40 mg total) by mouth daily. 90 tablet 1   pramipexole  (MIRAPEX ) 0.125 MG tablet Take 2 tablets (0.25 mg total) by mouth every evening. 180 tablet 3   REPATHA  SURECLICK 140 MG/ML SOAJ INJECT 140 mgs into THE SKIN EVERY 14 DAYS 6 mL 3   traZODone  (DESYREL ) 100 MG tablet Take 2 tablets (200 mg total) by mouth at bedtime. 180 tablet 1   triamterene -hydrochlorothiazide (MAXZIDE-25) 37.5-25 MG tablet Take 0.5 tablets daily by mouth. 90 tablet 3   venlafaxine  XR (EFFEXOR -XR) 150 MG 24 hr capsule TAKE 2 CAPSULES EVERY DAY WITH BREAKFAST 180 capsule 1   oxyCODONE  (ROXICODONE ) 5 MG immediate release tablet Take 1 tablet (5 mg total) by mouth every 6 (six) hours as needed for moderate pain (pain score 4-6) or severe pain (pain score 7-10) (in addition to baseline meds for post op pain). (Patient not taking: Reported on 11/29/2023) 40 tablet 0   tiZANidine  (ZANAFLEX ) 4 MG tablet Take 1 tablet (4 mg total) by mouth every 6 (six) hours as needed. (Patient not taking: Reported on 11/29/2023) 40 tablet 1   No current facility-administered medications for this visit.     Review of Systems    She denies chest pain, palpitations, dyspnea, pnd, orthopnea, n, v, dizziness, syncope, edema, weight gain, or early satiety. All other systems reviewed and are otherwise negative except as noted above.   Physical Exam    VS:  BP 123/69 (BP Location: Left Arm, Patient Position: Sitting, Cuff Size: Normal)   Pulse 68   Ht 5' 2.5 (1.588 m)   Wt 213 lb (96.6 kg)   LMP 05/30/2011   SpO2 97%   BMI 38.34 kg/m   GEN: Well nourished, well developed, in no acute  distress. HEENT: normal. Neck: Supple, no JVD, carotid bruits, or masses. Cardiac: RRR, no murmurs, rubs, or gallops. No clubbing, cyanosis, edema.  Radials/DP/PT 2+ and equal bilaterally.  Respiratory:  Respirations regular and unlabored, clear to auscultation bilaterally. GI: Soft, nontender, nondistended, BS + x 4. MS: no deformity or atrophy. Skin: warm and dry, no rash. Neuro:  Strength and sensation are intact. Psych: Normal affect.  Accessory Clinical Findings    ECG personally reviewed by me today -    - no EKG in office today.   Lab Results  Component Value Date   WBC 5.0 07/26/2023   HGB 13.0 07/26/2023   HCT 40.2 07/26/2023   MCV 94.1 07/26/2023   PLT 237 07/26/2023   Lab Results  Component Value Date   CREATININE 1.28 (H) 07/26/2023   BUN 25 (H) 07/26/2023   NA 137 07/26/2023   K 3.7 07/26/2023   CL 100 07/26/2023   CO2 28 07/26/2023   Lab Results  Component Value Date   ALT 30 12/20/2022   AST 26 12/20/2022   ALKPHOS 82 12/20/2022   BILITOT 0.6 12/20/2022   Lab Results  Component Value Date   CHOL 235 (H) 11/25/2017  HDL 70 11/25/2017   LDLCALC 142 (H) 11/25/2017   TRIG 113 11/25/2017   CHOLHDL 3.4 11/25/2017    Lab Results  Component Value Date   HGBA1C 5.6 01/28/2017    Assessment & Plan   1. Coronary artery calcification/aortic atherosclerosis: Coronary artery calcification, aortic atherosclerosis noted on prior CT imaging. Myoview  in 2018 showed EF 55 to 65%, no evidence of ischemia or infarction, low risk.  Stable with no anginal symptoms. No indication for ischemic evaluation. Continue Repatha .  2. Palpitations: Stable on atenolol .  3. Hyperlipidemia: LDL was 37 in 04/2023. Continue Repatha .   4. Prediabetes: A1c was 5.8 in 04/2023. Monitored and managed per PCP.   5. COPD/tobacco use: She has quit smoking.  I congratulated her on this.  6. Disposition: Follow-up in 1 year with Dr. Jeffrie.       Damien JAYSON Braver, NP 11/29/2023, 1:47  PM

## 2023-11-29 NOTE — Patient Instructions (Signed)
 Medication Instructions:  Your physician recommends that you continue on your current medications as directed. Please refer to the Current Medication list given to you today.  *If you need a refill on your cardiac medications before your next appointment, please call your pharmacy*  Lab Work: NONE ordered at this time of appointment   Testing/Procedures: NONE ordered at this time of appointment   Follow-Up: At [facility], you and your health needs are our priority.  As part of our continuing mission to provide you with exceptional heart care, our providers are all part of one team.  This team includes your primary Cardiologist (physician) and Advanced Practice Providers or APPs (Physician Assistants and Nurse Practitioners) who all work together to provide you with the care you need, when you need it.  Your next appointment:    1 year Dr. Jeffrie  We recommend signing up for the patient portal called MyChart.  Sign up information is provided on this After Visit Summary.  MyChart is used to connect with patients for Virtual Visits (Telemedicine).  Patients are able to view lab/test results, encounter notes, upcoming appointments, etc.  Non-urgent messages can be sent to your provider as well.   To learn more about what you can do with MyChart, go to [email or web address].   Other Instructions

## 2023-12-02 ENCOUNTER — Encounter: Payer: Self-pay | Admitting: Nurse Practitioner

## 2023-12-03 ENCOUNTER — Other Ambulatory Visit: Payer: Self-pay | Admitting: Primary Care

## 2023-12-03 DIAGNOSIS — Z79899 Other long term (current) drug therapy: Secondary | ICD-10-CM

## 2023-12-03 DIAGNOSIS — J449 Chronic obstructive pulmonary disease, unspecified: Secondary | ICD-10-CM

## 2023-12-06 ENCOUNTER — Telehealth: Payer: Self-pay | Admitting: *Deleted

## 2023-12-06 ENCOUNTER — Ambulatory Visit: Payer: Self-pay | Admitting: Primary Care

## 2023-12-06 ENCOUNTER — Other Ambulatory Visit: Payer: Self-pay | Admitting: *Deleted

## 2023-12-06 DIAGNOSIS — Z79899 Other long term (current) drug therapy: Secondary | ICD-10-CM

## 2023-12-06 DIAGNOSIS — J449 Chronic obstructive pulmonary disease, unspecified: Secondary | ICD-10-CM

## 2023-12-06 MED ORDER — FLUTICASONE-SALMETEROL 113-14 MCG/ACT IN AEPB: 1.0000 | INHALATION_SPRAY | Freq: Two times a day (BID) | RESPIRATORY_TRACT | 0 refills | Status: DC

## 2023-12-06 NOTE — Telephone Encounter (Signed)
 Copied from CRM [id number]. Topic: Clinical - Prescription Issue >> Dec 05, 2023  2:36 PM Rilla B wrote: Reason for CRM: Patient calling in stating her pharmacy has requested refill on her Fluticasone  inhaler. Per pharmacy states patient needs to be seen, however, patient does have appt on Wednesday, 11/12... however, she only has one puff left (not enough to last til Wednesday).  Please call patient [phone]   -----------------------------------------------------------------------

## 2023-12-06 NOTE — Telephone Encounter (Signed)
 I called an spoke with patient, verified inhaler she needed  refilled and local pharmacy to send refill.  Script sent.  Advised her to keep ov with beth on 11/12.  She verbalized understanding.  Nothing further needed.

## 2023-12-06 NOTE — Telephone Encounter (Signed)
 FYI Only or Action Required?: Action required by provider: medication refill request.  Patient is followed in Pulmonology for COPD, last seen on 10/18/2022 by Hope Almarie ORN, NP.  Called Nurse Triage reporting Medication Refill.    Triage Disposition: Call PCP Now  Patient/caregiver understands and will follow disposition?: Yes   **See note below**                       Copied from CRM #[id number]. Topic: Clinical - Red Word Triage >> Dec 06, 2023  8:35 AM Joesph PARAS wrote: Red Word that prompted transfer to Nurse Triage: Patient is now OUT of inhaler. Patient is disregarding PAS and demanding ti be fixed now. Reason for Disposition  [1] Prescription refill request for ESSENTIAL medicine (i.e., likelihood of harm to patient if not taken) AND [2] triager unable to refill per department policy  Answer Assessment - Initial Assessment Questions 1. DRUG NAME: What medicine do you need to have refilled?    Patient is requesting a refill for the Fluticasone -Salmeterol, she stated she is out of the medication and has been awaiting a refill since 11/8. Patient advised refill request has been sent, she states she is needing the refill sent to the pharmacy as soon as possible. Pt. Does have an upcomming appt. On 11/12 as well.  Protocols used: Medication Refill and Renewal Call-A-AH

## 2023-12-06 NOTE — Telephone Encounter (Signed)
 Duplicate.  See note from this morning.

## 2023-12-07 ENCOUNTER — Ambulatory Visit: Admitting: Primary Care

## 2023-12-07 ENCOUNTER — Encounter: Payer: Self-pay | Admitting: Primary Care

## 2023-12-07 VITALS — BP 126/72 | HR 74 | Temp 97.6°F | Ht 62.5 in | Wt 212.8 lb

## 2023-12-07 DIAGNOSIS — R918 Other nonspecific abnormal finding of lung field: Secondary | ICD-10-CM | POA: Diagnosis not present

## 2023-12-07 DIAGNOSIS — J449 Chronic obstructive pulmonary disease, unspecified: Secondary | ICD-10-CM

## 2023-12-07 MED ORDER — TRELEGY ELLIPTA 100-62.5-25 MCG/ACT IN AEPB: 1.0000 | INHALATION_SPRAY | Freq: Every day | RESPIRATORY_TRACT | Status: AC

## 2023-12-07 NOTE — Patient Instructions (Addendum)
  VISIT SUMMARY: You came in today for your one-year follow-up for mild COPD/emphysema. You mentioned occasional difficulty taking deep breaths, especially at night, and mild wheezing. You also shared that you have moderate shortness of breath when walking uphill or climbing stairs but no limitations in your daily activities. You are currently using Advair with an aerochamber and have a rescue inhaler for emergencies.  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 You will try a two-week sample of Trelegy inhaler in place of fluticasone -salmeterol, which you should use one puff once daily. If you find Trelegy more beneficial than your current regimen, please send a message via MyChart.  -STABLE PULMONARY NODULES: Pulmonary nodules are small growths in the lungs. Your nodules have not changed in size or characteristics since your last CT scan. We will continue with annual CT scans to monitor them for any changes.  INSTRUCTIONS: Please try the Trelegy inhaler for two weeks and send a message via MyChart if it works better for you than Advair. Continue with your annual CT scans to monitor the pulmonary nodules.                     Contains text generated by Abridge.                                 Contains text generated by Abridge.

## 2023-12-07 NOTE — Progress Notes (Signed)
 @Patient  ID: Anita Arroyo, female    DOB: [birth year], 63 y.o.   MRN: [id number]  No chief complaint on file.   Referring provider: Juliane Che, PA  HPI: 63 year old female, quit smoking June 2024.  Past medical history significant for COPD, HTN, GERD, chronic pain syndrome, fibromyalgia, hx breast cancer, prediabetes. Patient of Dr. Darlean.   Previous [facility] pulmonary encounter: 10/28/2020 Patient presents today for overdue follow-up. During last visit she was started on Airduo 113 (fluticasone -salmeterol) in place of Symbicort  160. She is doing well today, no acute complaints. She has a chronic cough which is occasionally productive. No issues affording current maintenance inhaler, needing refill. She is still smoking.   Dyspnea: With hills/inclines  Cough: Varies, occasionally productive SABA: Rare Nocturnal symptoms: None  05/13/2021 Patient presents today for 6 month follow-up. She is doing alright today. No acute complaints. He has a chronic cough, mucinex  has helped tremendously. She is complaint with Fluticasone -salmeterol inhaler twice daily. She has not needed to use her albuterol  rescue inhaler recently. She started weight watchers and has lost 20 lbs. Energy level is improving with weight loss. CAT score 12  Dyspnea: With inclines/hills Cough: Chronic productive cough with clear mucus, improved with mucinex  SABA: Rare Nocturnal symptoms: None    05/25/2022 Patient presents today for regular annual follow-up. Hx COPD. Current smoker. Maintained on Fluticasone -salmeterol one puff twice daily, prn Albuterol  and Singulair . She is doing well.  She currently has a lot of allergies symptoms including PND and cough. She has shortness of breath symptoms when walking up inclines/stairs. Otherwise she is not limited by her breathing. She can do all activities of daily living. She has lost 60 lbs which has help with her energy. She is still smoking 2 packs per day. She has tried  gum/patches. Chantix  helped in the past. When she takes this it messes up her sleep and she needs to take a sleep aid.   Dyspnea: inclines Cough: chronic productive cough  SABA: <1x/month Nocturnal symptoms: None  CAT score: 14  07/06/2022 Patient presents today for 6 week follow-up COPD and insomnia.  Patient is a current smoker.  Maintained on generic Advair and as needed albuterol .  She was treated for mild exacerbation in April with prednisone .  She is followed lung cancer screening program. Due for LDCT in August, last done at [facility] in August 2023.  She is interested in quitting, started on Chantix .  She has difficulty sleeping while on Chantix  and was provided Rx for Ambien  5 mg to take as needed for insomnia.  She is doing very well. Breathing is alright. No acute respiratory symptoms. She has not needed albuterol . She has significantly cut back on her smoking. [employer] quit date is end of July. Sleep is not as good as when she does not take chantix  but better with Ambien . She wakes up several times a night. This is manageable right now. Due for LDCT in August which has already been scheduled. She is going weight watchers.   10/18/2022 Patient presents today for risk assessment for right reversal total shoulder arthroplasty with [facility], date to be determined/ planning for December.  She is doing really well today.  She quit smoking 11 weeks ago.  No longer on Chantix  or Ambien . Her breathing is stable. Cough is resolved.  No longer needing Mucinex .  She is using fluticasone  salmeterol as directed. Rarely if at all requires albuterol  hfa. No recent COPD exacerbations requiring antibiotics or steriods . PFTs on 2020 showed minimal  obstruction. LDCT 09/20/22 showed smild centrilobular and paraseptal emphysema; stable small scattered right lung nodules, largest measuring 5.94mm unchanged from previous.   12/07/2023- Interim hx  Discussed the use of AI scribe software for clinical note  transcription with the patient, who gave verbal consent to proceed.  History of Present Illness Anita Arroyo is a 63 year old female with mild COPD who presents for a one-year follow-up.  She experiences occasional difficulty taking a deep breath, particularly at night, and uses an inhaler, Advair, with an aerochamber, which she finds helpful. She has mild wheezing and a history of emphysema noted on a CT scan from August of last year. No cough, phlegm, or chest tightness. She experiences moderate shortness of breath when walking up a hill or a flight of stairs but is not limited in her activities at home and feels confident leaving her home despite her lung condition.  She has a history of smoking but quit in 2024. Although she denies current smoking, she wants to smoke again. She reports moderate issues with energy, which she attributes to weight gain after quitting smoking, affecting her ability to walk her dogs. Her sleep is not good, but she attributes this to non-respiratory issues being managed by her 'head doctor'.  Her current medication includes Advair, and she is on a medium dose of steroid inhaler. She has a rescue inhaler for emergencies, which she has used once recently.   Allergies  Allergen Reactions   Contrast Media [Iodinated Contrast Media] Anaphylaxis    Anaphylactic shock.   Celebrex [Celecoxib] Other (See Comments)    I climb the walls   Statins Other (See Comments)    Body aches, constipation AND DEPRESSION   Sulfa Antibiotics Nausea And Vomiting    Other Reaction(s): GI Intolerance   Zetia  [Ezetimibe ] Other (See Comments)    I can't take any cholesterol medicines.  Other Reaction(s): Other (See Comments)   Augmentin [Amoxicillin-Pot Clavulanate] Rash and Other (See Comments)   Ceclor [Cefaclor] Rash   Moxifloxacin Rash   Sulfonamide Derivatives Nausea And Vomiting    Immunization History  Administered Date(s) Administered    sv, Bivalent, Protein Subunit  Rsvpref,pf (Abrysvo) 01/06/2022   Fluad Quad(high Dose 65+) 10/17/2021   INFLUENZA, HIGH DOSE SEASONAL PF 11/27/2018, 01/29/2019   Influenza, Seasonal, Injecte, Preservative Fre 10/25/2017, 11/27/2018   Influenza,inj,Quad PF,6+ Mos 10/12/2016, 10/25/2017, 11/27/2018   Influenza,inj,Quad PF,6-35 Mos 10/18/2020   Influenza-Unspecified 11/24/2019   Moderna Sars-Covid-2 Vaccination 04/17/2019, 05/15/2019, 11/27/2019, 07/22/2020   PNEUMOCOCCAL CONJUGATE-20 11/06/2020   Pfizer Covid-19 Vaccine Bivalent Booster 87yrs & up 10/23/2020, 10/30/2021   Pfizer(Comirnaty)Fall Seasonal Vaccine 12 years and older 11/22/2023   Pneumococcal Polysaccharide-23 11/25/2005   Pneumococcal-Unspecified 11/25/2005   Rabies, IM 03/13/2016, 03/16/2016, 03/20/2016, 03/27/2016   Tdap 03/13/2016   Zoster Recombinant(Shingrix) 10/19/2019, 12/24/2019, 03/14/2020    Past Medical History:  Diagnosis Date   Allergy    pollen and environmental   Anxiety    Arthritis    Asthma    Bipolar 1 disorder (HCC)    Breast cancer (HCC)    Cataract    COPD (chronic obstructive pulmonary disease) (HCC)    Depression    Fibromyalgia    Finger fracture 2011   GERD (gastroesophageal reflux disease)    Heart murmur    History of stress test 04/28/2011   showed normal perfusion without scar or ischemia   Hx of echocardiogram 04/28/2011   showed normal systolic function with mild diastolic dysfunction,she had trace MR but did not  have frank mitral valve prolapse demonstrated. She had mild pulmonary hypertension with an estimated RV systolic pressure at 34 mm.   Hyperlipemia    Meniere disease    Pre-diabetes     Tobacco History: Social History   Tobacco Use  Smoking Status Former   Average packs/day: 2.0 packs/day for 1 year (2.0 ttl pk-yrs)   Types: Cigarettes   Start date: 59   Quit date: 01/26/1975   Years since quitting: 48.8   Passive exposure: Never  Smokeless Tobacco Never  Tobacco Comments   Quit smoking 11  weeks.  [facility] Pt stated she quit 08/05/2022. AW   Counseling given: Not Answered Tobacco comments: Quit smoking 11 weeks.  [facility] Pt stated she quit 08/05/2022. AW   Outpatient Medications Prior to Visit  Medication Sig Dispense Refill   albuterol  (VENTOLIN  HFA) 108 (90 Base) MCG/ACT inhaler Inhale 2 puffs into the lungs every 4 (four) hours as needed for wheezing or shortness of breath (cough, shortness of breath or wheezing.). 8 g 1   ARIPiprazole  (ABILIFY ) 5 MG tablet Take 1 tablet (5 mg total) by mouth daily. 90 tablet 1   aspirin  EC 81 MG tablet Take 1 tablet (81 mg total) by mouth 2 (two) times daily. For 2 weeks then back to once a day for DVT prevention. (Patient taking differently: Take 81 mg by mouth daily.) 30 tablet 0   atenolol  (TENORMIN ) 50 MG tablet Take 50 mg in the morning and 25 mg in the evening 135 tablet 1   BELSOMRA 15 MG TABS Take 15 mg by mouth at bedtime.     Biotin 89999 MCG TABS Take 1 tablet by mouth daily.     CALCIUM  CARBONATE-VITAMIN D  PO Take 1 tablet by mouth daily.     cetirizine (ZYRTEC) 10 MG tablet Take 10 mg by mouth daily.     diclofenac sodium (VOLTAREN) 1 % GEL Apply 1 Application topically daily as needed (pain).     Evolocumab  (REPATHA  SURECLICK) 140 MG/ML SOAJ 140 mg by Other route every 14 (fourteen) days. 6 mL 3   fluticasone  (CUTIVATE ) 0.05 % cream Apply 1 Application topically 2 (two) times daily as needed (irritation).     fluticasone  (FLONASE ) 50 MCG/ACT nasal spray USE 1 SPRAY IN EACH NOSTRIL TWICE DAILY 48 g 1   Fluticasone -Salmeterol 113-14 MCG/ACT AEPB Inhale 1 puff into the lungs in the morning and at bedtime. INHALE 1 PUFF BY MOUTH 2 TIMES DAILY 1 each 0   HYDROcodone -acetaminophen  (NORCO/VICODIN) 5-325 MG tablet Take 1 tablet by mouth 4 (four) times daily.     JARDIANCE 10 MG TABS tablet Take 10 mg by mouth daily.     L-Methylfolate 15 MG TABS Take 1 tablet (15 mg total) by mouth daily. 90 tablet 1   lamoTRIgine  (LAMICTAL ) 200 MG tablet TAKE  1/2 TABLET EVERY MORNING  AND TAKE 2 TABLETS AT BEDTIME (Patient taking differently: Take 450 mg by mouth at bedtime.) 225 tablet 3   LUTEIN PO Take 2 mg by mouth daily.     Magnesium 500 MG TABS Take 500 mg by mouth daily.     meclizine  (ANTIVERT ) 25 MG tablet Take 1 tablet (25 mg total) by mouth as needed for dizziness. 30 tablet 1   montelukast  (SINGULAIR ) 10 MG tablet Take 1 tablet (10 mg total) by mouth at bedtime. 90 tablet 3   oxyCODONE  (ROXICODONE ) 5 MG immediate release tablet Take 1 tablet (5 mg total) by mouth every 6 (six) hours as needed for  moderate pain (pain score 4-6) or severe pain (pain score 7-10) (in addition to baseline meds for post op pain). (Patient not taking: Reported on 11/29/2023) 40 tablet 0   pantoprazole  (PROTONIX ) 40 MG tablet Take 1 tablet (40 mg total) by mouth daily. 90 tablet 1   pramipexole  (MIRAPEX ) 0.125 MG tablet Take 2 tablets (0.25 mg total) by mouth every evening. 180 tablet 3   tiZANidine  (ZANAFLEX ) 4 MG tablet Take 1 tablet (4 mg total) by mouth every 6 (six) hours as needed. (Patient not taking: Reported on 11/29/2023) 40 tablet 1   traZODone  (DESYREL ) 100 MG tablet Take 2 tablets (200 mg total) by mouth at bedtime. 180 tablet 1   triamterene -hydrochlorothiazide (MAXZIDE-25) 37.5-25 MG tablet Take 0.5 tablets daily by mouth. 90 tablet 3   venlafaxine  XR (EFFEXOR -XR) 150 MG 24 hr capsule TAKE 2 CAPSULES EVERY DAY WITH BREAKFAST 180 capsule 1   No facility-administered medications prior to visit.   Review of Systems  Review of Systems  Constitutional:  Positive for fatigue.  Respiratory:  Positive for shortness of breath. Negative for cough, chest tightness and wheezing.    Physical Exam  LMP 05/30/2011  Physical Exam Constitutional:      General: She is not in acute distress.    Appearance: Normal appearance. She is well-developed. She is not ill-appearing.  HENT:     Head: Normocephalic and atraumatic.     Mouth/Throat:     Mouth: Mucous  membranes are moist.     Pharynx: Oropharynx is clear.  Eyes:     Pupils: Pupils are equal, round, and reactive to light.  Cardiovascular:     Rate and Rhythm: Normal rate and regular rhythm.     Heart sounds: Normal heart sounds. No murmur heard. Pulmonary:     Effort: Pulmonary effort is normal. No respiratory distress.     Breath sounds: Normal breath sounds. No wheezing or rhonchi.  Musculoskeletal:        General: Normal range of motion.     Cervical back: Normal range of motion and neck supple.  Skin:    General: Skin is warm and dry.     Findings: No erythema or rash.  Neurological:     General: No focal deficit present.     Mental Status: She is alert and oriented to person, place, and time. Mental status is at baseline.  Psychiatric:        Mood and Affect: Mood normal.        Behavior: Behavior normal.        Thought Content: Thought content normal.        Judgment: Judgment normal.     Lab Results:  CBC    Component Value Date/Time   WBC 5.0 07/26/2023 1047   RBC 4.27 07/26/2023 1047   HGB 13.0 07/26/2023 1047   HGB 14.2 08/12/2016 1659   HGB 13.7 08/20/2014 1117   HCT 40.2 07/26/2023 1047   HCT 41.9 08/12/2016 1659   HCT 40.3 08/20/2014 1117   PLT 237 07/26/2023 1047   PLT 230 08/12/2016 1659   MCV 94.1 07/26/2023 1047   MCV 93 08/12/2016 1659   MCV 94.1 08/20/2014 1117   MCH 30.4 07/26/2023 1047   MCHC 32.3 07/26/2023 1047   RDW 13.0 07/26/2023 1047   RDW 14.1 08/12/2016 1659   RDW 13.2 08/20/2014 1117   LYMPHSABS 2.4 03/13/2016 1720   LYMPHSABS 2.0 08/20/2014 1117   MONOABS 0.9 03/13/2016 1720   MONOABS 0.5 08/20/2014 1117  EOSABS 0.0 03/13/2016 1720   EOSABS 0.1 08/20/2014 1117   BASOSABS 0.0 03/13/2016 1720   BASOSABS 0.1 08/20/2014 1117    BMET    Component Value Date/Time   NA 137 07/26/2023 1047   NA 140 11/25/2017 1155   NA 138 08/20/2014 1117   K 3.7 07/26/2023 1047   K 4.7 08/20/2014 1117   CL 100 07/26/2023 1047   CL 104  06/29/2012 1221   CO2 28 07/26/2023 1047   CO2 26 08/20/2014 1117   GLUCOSE 83 07/26/2023 1047   GLUCOSE 92 08/20/2014 1117   GLUCOSE 107 (H) 06/29/2012 1221   BUN 25 (H) 07/26/2023 1047   BUN 17 11/25/2017 1155   BUN 16.0 08/20/2014 1117   CREATININE 1.28 (H) 07/26/2023 1047   CREATININE 0.9 08/20/2014 1117   CALCIUM  9.9 07/26/2023 1047   CALCIUM  9.4 08/20/2014 1117   GFRNONAA 47 (L) 07/26/2023 1047   GFRAA 78 11/25/2017 1155    BNP No results found for: BNP  ProBNP No results found for: PROBNP  Imaging: MM 3D DIAGNOSTIC MAMMOGRAM UNILATERAL LEFT BREAST Result Date: 11/11/2023 CLINICAL DATA:  63 year old female recalled for possible asymmetry seen in the left breast EXAM: DIGITAL DIAGNOSTIC UNILATERAL LEFT MAMMOGRAM WITH TOMOSYNTHESIS AND CAD TECHNIQUE: Left digital diagnostic mammography and breast tomosynthesis was performed. The images were evaluated with computer-aided detection. COMPARISON:  Previous exam(s). ACR Breast Density Category c: The breasts are heterogeneously dense, which may obscure small masses. FINDINGS: Previously questioned asymmetry resolves on additional images performed today including focal spot compression tomosynthesis views. No suspicious findings seen in the left breast on today's exam. Stable scattered dystrophic benign calcifications in the left breast. IMPRESSION: No mammographic evidence of malignancy in the left breast. RECOMMENDATION: Routine annual screening mammogram in 1 year. I have discussed the findings and recommendations with the patient. If applicable, a reminder letter will be sent to the patient regarding the next appointment. BI-RADS CATEGORY  2: Benign. Electronically Signed   By: Rosina Gelineau M.D.   On: 11/11/2023 14:05     Assessment & Plan:   1. Chronic obstructive pulmonary disease, unspecified COPD type (HCC) (Primary)  2. Multiple pulmonary nodules determined by computed tomography of lung  Assessment and  Plan Assessment & Plan Chronic obstructive pulmonary disease (COPD) with mild emphysema Former smoker quit in 2024. Mild COPD with emphysema on CT scan from August 2025. Reports difficulty taking deep breaths, especially at night, and occasional wheezing. No current cough, phlegm, or chest tightness. Moderate dyspnea on exertion, such as walking uphill or climbing stairs. No limitations in daily activities. Lungs clear on examination with no wheezing. Previously on medium dose Advair with an aerochamber, which has been helpful. Considering a higher dose ICS or alternative treatment due to persistent symptoms. - Provided a two-week sample of Trelegy 100mcg (fluticasone /vilanterol/umeclidinium) inhaler, one puff once daily. - Instructed to send a message via MyChart if Trelegy is more beneficial than current Advair regimen.  Stable pulmonary nodules Pulmonary nodules stable with no change in size or characteristics on CT scan from August 2025. Maximum nodule size is 7.4 mm. - Continue annual CT scan for lung cancer screening.    Almarie LELON Ferrari, NP 12/07/2023

## 2023-12-27 NOTE — Telephone Encounter (Signed)
 Anita Arroyo, Patient has been using the Trelegy for 2 weeks and says it is working well and would like to continue taking it.  Please advise regarding sending a prescription to her pharmacy.  Thank you.

## 2023-12-27 NOTE — Telephone Encounter (Signed)
 Yes, happy it helped. Please send in RX for current dose 100mcg

## 2023-12-28 MED ORDER — TRELEGY ELLIPTA 100-62.5-25 MCG/ACT IN AEPB: 1.0000 | INHALATION_SPRAY | Freq: Every day | RESPIRATORY_TRACT | 5 refills | Status: AC

## 2024-01-16 ENCOUNTER — Other Ambulatory Visit: Payer: Self-pay | Admitting: Pharmacist

## 2024-01-16 DIAGNOSIS — E785 Hyperlipidemia, unspecified: Secondary | ICD-10-CM

## 2024-01-16 DIAGNOSIS — I700 Atherosclerosis of aorta: Secondary | ICD-10-CM

## 2024-01-16 DIAGNOSIS — I2510 Atherosclerotic heart disease of native coronary artery without angina pectoris: Secondary | ICD-10-CM

## 2024-01-16 MED ORDER — REPATHA SURECLICK 140 MG/ML ~~LOC~~ SOAJ: 140.0000 mg | SUBCUTANEOUS | 11 refills | Status: AC

## 2024-02-17 ENCOUNTER — Other Ambulatory Visit: Payer: Self-pay | Admitting: Primary Care

## 2024-02-17 DIAGNOSIS — J449 Chronic obstructive pulmonary disease, unspecified: Secondary | ICD-10-CM

## 2024-02-17 DIAGNOSIS — Z79899 Other long term (current) drug therapy: Secondary | ICD-10-CM

## 2024-02-28 ENCOUNTER — Telehealth: Payer: Self-pay

## 2024-02-28 NOTE — Telephone Encounter (Signed)
 Copied from CRM [id number]. Topic: Clinical - Prescription Issue >> Feb 28, 2024  1:01 PM Rilla B wrote: NOTE:  Patient calling to send a message to Almarie Ferrari to stop Trelegy due tp Wheezing and coughing.  Patient states she went back to Fluticasone -Salmeterol 113-14 MCG/ACT.   Noted   will discuss at next OV
